# Patient Record
Sex: Male | Born: 1945 | Race: White | Hispanic: No | Marital: Married
Health system: Southern US, Academic
[De-identification: ages and names within clinical notes are randomized; demographics above are authoritative.]

## PROBLEM LIST (undated history)

## (undated) ENCOUNTER — Ambulatory Visit

## (undated) ENCOUNTER — Ambulatory Visit: Payer: MEDICARE

## (undated) ENCOUNTER — Telehealth

## (undated) ENCOUNTER — Telehealth: Attending: Internal Medicine | Primary: Internal Medicine

## (undated) ENCOUNTER — Encounter

## (undated) ENCOUNTER — Encounter: Attending: Cardiovascular Disease | Primary: Cardiovascular Disease

## (undated) ENCOUNTER — Encounter: Attending: Gerontology | Primary: Gerontology

## (undated) ENCOUNTER — Ambulatory Visit: Attending: Pharmacist | Primary: Pharmacist

## (undated) ENCOUNTER — Other Ambulatory Visit: Attending: Internal Medicine | Primary: Internal Medicine

## (undated) ENCOUNTER — Other Ambulatory Visit

## (undated) ENCOUNTER — Encounter: Attending: Geriatric Medicine | Primary: Geriatric Medicine

## (undated) ENCOUNTER — Encounter: Attending: Internal Medicine | Primary: Internal Medicine

## (undated) ENCOUNTER — Encounter: Payer: MEDICARE | Attending: Physical Medicine & Rehabilitation | Primary: Physical Medicine & Rehabilitation

## (undated) ENCOUNTER — Encounter: Attending: Pharmacotherapy | Primary: Pharmacotherapy

## (undated) ENCOUNTER — Encounter: Attending: Rheumatology | Primary: Rheumatology

## (undated) ENCOUNTER — Encounter: Attending: Family | Primary: Family

## (undated) ENCOUNTER — Ambulatory Visit
Payer: MEDICARE | Attending: Student in an Organized Health Care Education/Training Program | Primary: Student in an Organized Health Care Education/Training Program

## (undated) ENCOUNTER — Inpatient Hospital Stay

## (undated) ENCOUNTER — Ambulatory Visit: Payer: MEDICARE | Attending: Cardiovascular Disease | Primary: Cardiovascular Disease

## (undated) ENCOUNTER — Ambulatory Visit: Payer: MEDICARE | Attending: Internal Medicine | Primary: Internal Medicine

## (undated) ENCOUNTER — Encounter: Payer: MEDICARE | Attending: Rheumatology | Primary: Rheumatology

## (undated) ENCOUNTER — Ambulatory Visit: Attending: Clinical | Primary: Clinical

## (undated) ENCOUNTER — Telehealth
Attending: Student in an Organized Health Care Education/Training Program | Primary: Student in an Organized Health Care Education/Training Program

## (undated) ENCOUNTER — Encounter
Payer: MEDICARE | Attending: Student in an Organized Health Care Education/Training Program | Primary: Student in an Organized Health Care Education/Training Program

## (undated) ENCOUNTER — Telehealth: Attending: Rheumatology | Primary: Rheumatology

## (undated) ENCOUNTER — Ambulatory Visit: Payer: MEDICARE | Attending: Gerontology | Primary: Gerontology

## (undated) ENCOUNTER — Encounter
Attending: Student in an Organized Health Care Education/Training Program | Primary: Student in an Organized Health Care Education/Training Program

## (undated) ENCOUNTER — Telehealth: Attending: Geriatric Medicine | Primary: Geriatric Medicine

## (undated) ENCOUNTER — Ambulatory Visit: Payer: MEDICAID | Attending: Pharmacotherapy | Primary: Pharmacotherapy

## (undated) ENCOUNTER — Encounter
Payer: MEDICARE | Attending: Rehabilitative and Restorative Service Providers" | Primary: Rehabilitative and Restorative Service Providers"

## (undated) ENCOUNTER — Ambulatory Visit: Payer: MEDICAID | Attending: Internal Medicine | Primary: Internal Medicine

## (undated) ENCOUNTER — Emergency Department (HOSPITAL_COMMUNITY)

## (undated) DIAGNOSIS — M1990 Unspecified osteoarthritis, unspecified site: Secondary | ICD-10-CM

## (undated) DIAGNOSIS — I10 Essential (primary) hypertension: Secondary | ICD-10-CM

## (undated) DIAGNOSIS — F0390 Unspecified dementia without behavioral disturbance: Secondary | ICD-10-CM

## (undated) HISTORY — PX: CHOLECYSTECTOMY: SHX55

## (undated) HISTORY — PX: THROAT SURGERY: SHX803

---
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
## 2003-01-31 ENCOUNTER — Ambulatory Visit (HOSPITAL_COMMUNITY): Admission: RE | Admit: 2003-01-31 | Discharge: 2003-01-31 | Payer: Self-pay | Admitting: Gastroenterology

## 2003-06-19 ENCOUNTER — Ambulatory Visit (HOSPITAL_BASED_OUTPATIENT_CLINIC_OR_DEPARTMENT_OTHER): Admission: RE | Admit: 2003-06-19 | Discharge: 2003-06-19 | Payer: Self-pay | Admitting: Family Medicine

## 2003-08-22 ENCOUNTER — Ambulatory Visit (HOSPITAL_COMMUNITY): Admission: RE | Admit: 2003-08-22 | Discharge: 2003-08-22 | Payer: Self-pay | Admitting: General Surgery

## 2004-07-26 ENCOUNTER — Ambulatory Visit: Payer: Self-pay | Admitting: Cardiology

## 2004-08-10 ENCOUNTER — Ambulatory Visit: Payer: Self-pay

## 2005-03-28 ENCOUNTER — Emergency Department (HOSPITAL_COMMUNITY): Admission: EM | Admit: 2005-03-28 | Discharge: 2005-03-28 | Payer: Self-pay | Admitting: Emergency Medicine

## 2006-10-25 ENCOUNTER — Emergency Department (HOSPITAL_COMMUNITY): Admission: EM | Admit: 2006-10-25 | Discharge: 2006-10-25 | Payer: Self-pay | Admitting: Emergency Medicine

## 2006-11-04 ENCOUNTER — Emergency Department (HOSPITAL_COMMUNITY): Admission: EM | Admit: 2006-11-04 | Discharge: 2006-11-04 | Payer: Self-pay | Admitting: Emergency Medicine

## 2006-11-19 ENCOUNTER — Ambulatory Visit: Payer: Self-pay | Admitting: Cardiology

## 2006-11-19 ENCOUNTER — Inpatient Hospital Stay (HOSPITAL_COMMUNITY): Admission: EM | Admit: 2006-11-19 | Discharge: 2006-11-21 | Payer: Self-pay | Admitting: Emergency Medicine

## 2006-11-20 ENCOUNTER — Encounter (INDEPENDENT_AMBULATORY_CARE_PROVIDER_SITE_OTHER): Payer: Self-pay | Admitting: Internal Medicine

## 2006-11-27 ENCOUNTER — Ambulatory Visit: Payer: Self-pay

## 2006-12-11 ENCOUNTER — Ambulatory Visit: Payer: Self-pay | Admitting: Cardiology

## 2009-05-03 ENCOUNTER — Encounter (INDEPENDENT_AMBULATORY_CARE_PROVIDER_SITE_OTHER): Payer: Self-pay | Admitting: *Deleted

## 2009-06-02 DIAGNOSIS — R0789 Other chest pain: Secondary | ICD-10-CM

## 2009-06-02 DIAGNOSIS — I359 Nonrheumatic aortic valve disorder, unspecified: Secondary | ICD-10-CM | POA: Insufficient documentation

## 2009-06-02 DIAGNOSIS — I10 Essential (primary) hypertension: Secondary | ICD-10-CM | POA: Insufficient documentation

## 2009-06-02 DIAGNOSIS — R0989 Other specified symptoms and signs involving the circulatory and respiratory systems: Secondary | ICD-10-CM | POA: Insufficient documentation

## 2009-06-02 DIAGNOSIS — E785 Hyperlipidemia, unspecified: Secondary | ICD-10-CM | POA: Insufficient documentation

## 2009-06-02 DIAGNOSIS — R0609 Other forms of dyspnea: Secondary | ICD-10-CM

## 2010-06-09 ENCOUNTER — Encounter: Payer: Self-pay | Admitting: Internal Medicine

## 2010-09-09 ENCOUNTER — Emergency Department (HOSPITAL_COMMUNITY): Payer: No Typology Code available for payment source

## 2010-09-09 ENCOUNTER — Emergency Department (HOSPITAL_COMMUNITY)
Admission: EM | Admit: 2010-09-09 | Discharge: 2010-09-09 | Disposition: A | Discharge status: Home / Self Care | Payer: No Typology Code available for payment source | Attending: Emergency Medicine | Admitting: Emergency Medicine

## 2010-09-09 DIAGNOSIS — Z79899 Other long term (current) drug therapy: Secondary | ICD-10-CM | POA: Insufficient documentation

## 2010-09-09 DIAGNOSIS — R062 Wheezing: Secondary | ICD-10-CM | POA: Insufficient documentation

## 2010-09-09 DIAGNOSIS — J42 Unspecified chronic bronchitis: Secondary | ICD-10-CM | POA: Insufficient documentation

## 2010-09-09 DIAGNOSIS — R55 Syncope and collapse: Secondary | ICD-10-CM | POA: Insufficient documentation

## 2010-09-09 DIAGNOSIS — R059 Cough, unspecified: Secondary | ICD-10-CM | POA: Insufficient documentation

## 2010-09-09 DIAGNOSIS — R51 Headache: Secondary | ICD-10-CM | POA: Insufficient documentation

## 2010-09-09 DIAGNOSIS — M542 Cervicalgia: Secondary | ICD-10-CM | POA: Insufficient documentation

## 2010-09-09 DIAGNOSIS — I10 Essential (primary) hypertension: Secondary | ICD-10-CM | POA: Insufficient documentation

## 2010-09-09 DIAGNOSIS — E7800 Pure hypercholesterolemia, unspecified: Secondary | ICD-10-CM | POA: Insufficient documentation

## 2010-09-09 DIAGNOSIS — R404 Transient alteration of awareness: Secondary | ICD-10-CM | POA: Insufficient documentation

## 2010-09-09 DIAGNOSIS — R109 Unspecified abdominal pain: Secondary | ICD-10-CM | POA: Insufficient documentation

## 2010-09-09 DIAGNOSIS — Z8673 Personal history of transient ischemic attack (TIA), and cerebral infarction without residual deficits: Secondary | ICD-10-CM | POA: Insufficient documentation

## 2010-09-09 DIAGNOSIS — R05 Cough: Secondary | ICD-10-CM | POA: Insufficient documentation

## 2010-10-02 NOTE — Assessment & Plan Note (Signed)
[facility]                            CARDIOLOGY OFFICE NOTE   MAVERIK, FOOT                        MRN:          [id number]  DATE:12/11/2006                            DOB:          March 27, 1946    Dennis [name] returns today for followup.  He was hospitalized July 2.  He  had some atypical chest pain.   At that time, a chest CT was negative for a pulmonary embolus.  He  subsequently had a stress Myoview in the office here on November 27, 2006,  which was an EF of 53% with no ischemia.   He had had a previous 2D echocardiogram as well, which demonstrated mild  aortic valve calcification, mild aortic insufficiency, otherwise normal.   He comes today for followup.  He still has some shortness of breath, but  I have reviewed all of his studies at great length and tried to reassure  him that there is nothing major going on.  He did smoke years ago, but  quit.  He could have some chronic lung disease.   CURRENT MEDICATIONS:  1. Crestor 10 mg a day.  2. Aspirin 81 mg a day.  3. Atenolol 25 mg a day.   PHYSICAL EXAM:  Blood pressure is 120/68, pulse 74 and regular, his  weight is 164.  The rest of his exam is unchanged.   Dennis [name] is stable from a cardiovascular standpoint.  All serous causes  of shortness of breath have been ruled out.  I share with him all the  studies again, and their limitations, particularly nuclear cardiology.  He probably does have some plaque, but it is probably nonobstructive.   I stress risk factor modification and staying active.  I will see him  back in a year.  He probably ought to have an echocardiogram every 2  years.     Thomas C. Daleen Squibb, MD, [facility]  Electronically Signed    TCW/MedQ  DD: 12/11/2006  DT: 12/12/2006  Job #: [id number]   cc:   Lianne Bushy, M.D.

## 2010-10-02 NOTE — Discharge Summary (Signed)
NAME:  Dennis [name], Dennis [name]                 ACCOUNT NO.:  [id number]   MEDICAL RECORD NO.:  [id number]          PATIENT TYPE:  INP   LOCATION:  4735                         FACILITY:  [facility]   PHYSICIAN:  Lonia Blood, M.D.       DATE OF BIRTH:  1945-07-12   DATE OF ADMISSION:  11/19/2006  DATE OF DISCHARGE:  11/21/2006                               DISCHARGE SUMMARY   PRIMARY CARE PHYSICIAN:  Lianne Bushy, M.D.--which makes the patient  unassigned for [facility].   DISCHARGE DIAGNOSIS:  1. Chest pain--noncardiac--resolved.  2. Hypertension.  3. Hyperlipidemia.  4. Exertional asthma.  5. Status post hernia repair.  6. Status post right shoulder surgery.   DISCHARGE MEDICATIONS:  1. Crestor 10 mg daily.  2. Atenolol 25 mg daily.  3. Aspirin 81 mg daily.  4. Albuterol inhaler as needed.   CONDITION ON DISCHARGE:  [name] was discharged in good condition.  The patient was set up for a follow-up outpatient stress test with Dr.  Daleen Squibb from [facility].   PROCEDURE DURING THIS ADMISSION:  1. On November 20, 2006, the patient underwent transthoracic echocardiogram      with findings of ejection fraction of 55% to 65%, mild aortic valve      regurgitation, and mild aortic root dilatation.  2. November 19, 2006, a computer MUGA scan of the chest with intravenous      contrast with findings of no pulmonary embolus and no acute      intrathoracic abnormalities.   HISTORY AND PHYSICAL:  For admission history and physical, refer to  dictated history and physical done by Dr. Karilyn Cota on November 19, 2006.   HOSPITAL COURSE.:  Chest pain:  [name] was admitted to the telemetry  unit of [facility].  He did not show any arrhythmias during the  period of observation.  The patient continued to do well without any  further recurrence of his chest pain.  Interesting enough, the patient  did improve with nonsteroidal anti-inflammatory drugs.  Due to the fact  that the patient has  multiple risk factors for coronary artery disease,  he was seen in consultation by  the [facility], and it was felt that the patient would  benefit from an outpatient stress test.  The patient is discharged home  in good condition, and he is instructed to follow up with Dr. Daleen Squibb in  the outpatient setting.      Lonia Blood, M.D.  Electronically Signed     SL/MEDQ  D:  11/22/2006  T:  11/22/2006  Job:  [id number]   cc:   [facility]

## 2010-10-02 NOTE — Consult Note (Signed)
NAME:  Dennis [name], Dennis [name]                 ACCOUNT NO.:  [id number]   MEDICAL RECORD NO.:  [id number]          PATIENT TYPE:  INP   LOCATION:  4735                         FACILITY:  [facility]   PHYSICIAN:  Thomas C. Wall, MD, FACCDATE OF BIRTH:  1946-05-06   DATE OF CONSULTATION:  11/20/2006  DATE OF DISCHARGE:                                 CONSULTATION   PRIMARY CARDIOLOGIST:  Maisie Fus C. Daleen Squibb, MD, [facility]   PRIMARY CARE PHYSICIAN:  Lianne Bushy, M.D.   We are being asked to see the patient by [facility] secondary to  chest pain.   HISTORY OF PRESENT ILLNESS:  This is a 65 year old Caucasian male with  past medical history of hypertension and dyslipidemia, but no known  coronary artery disease who presented to the emergency room after three  days of constant chest pain.  The pain began on Sunday night while he  was fishing, accompanied by diaphoresis, lightheadedness and dyspnea,  along with palpitations and radiation to the left arm and left leg pain.  He ignored these symptoms when home, and went to bed thinking that it  was an anxiety attack.  When he woke up in the morning, the symptoms had  subsided but the chest pain remained.  He described it as persistent  pain, like someone was pushing on his chest.  Nothing made it better or  worse.  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  The patient was given aspirin and  nitroglycerin and the pain was relieved in the ED.  He has never had an  episode like this before.  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  Had an  echocardiogram.  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  But then he had a follow-up  echocardiogram and it was shown to be open, but  then he was told that it  would close again on its own.  The patient did have a previous cardiac  catheterization in 1994, which was normal.  The patient has had  complaints of generalized fatigue since chest pain onset, but denies  fevers, chills, orthopnea or PND.   REVIEW OF SYSTEMS:  As above; otherwise negative.   PAST MEDICAL HISTORY:  1. Hypertension.  2. Dyslipidemia.  3. TIA versus stroke, 2004 and 2005.  4. Exertional asthma (patient was on inhalers, but has stopped taking      secondary to cough).  5. Past coronary artery workup.  The patient did have a coronary      angiography in 1994, which was normal.  6. LVEF was 50-55%.  The patient did have an Adenosine Cardiolite      which was completed in 2004, revealing left ventricular  function      calculated at 48%, Myoview scan with normal perfusion, consider      echocardiogram to define ejection fraction.   PAST SURGICAL HISTORY:  1. Hernia repair x2.  2. Right shoulder repair.  3. Right thumb repair for tendon replacement.   SOCIAL HISTORY:  The patient lives in [place], [place], alone.  He works as a Production designer, theatre/television/film man.  He is unmarried.  He has a 30-year pack  use, but he has stopped for 10-12 years.  Occasional alcohol use.  No  illicit drug use.  The patient is active, doing a lot of hunting and  fishing and manual labor.  He does not eat any fried foods.   FAMILY HISTORY:  Mother with pulmonary fibrosis, kidney function and  heart trouble.  Father with emphysema and heart problems.  He had a  brother which died at age 53 with enlarged heart.  He had a brother  with an MI at age 54 and subsequent MI at age 71, who is still alive and  well.   REVIEW OF SYSTEMS:  As described; otherwise negative.   CURRENT LABS:  Sodium 142, potassium 4.3, chloride 109, CO2 26, BUN 18,  creatinine 0.94, glucose 83, hemoglobin 14.5, hematocrit 42.5, white  blood cells 8.4, platelets 227, total bilirubin 0.5, AST 20, ALT  20,  alkaline phosphatase 32, CK-MB 82, MB 3.1, troponin 0.03 and 0.03,  respectively.  Total cholesterol 127, LDL 83, HDL 21, triglycerides 117,  D-dimer 1.46, calcium 9.3.  Chest CT angio was negative for PE; did show  a small hiatal hernia.   CURRENT MEDICATIONS:  1. Lovenox 40 mg subcutaneous.  2. Ibuprofen.  3. Protonix 40 mg daily.  4. Simvastatin 40 mg daily.  5. Nitroglycerin sublingual p.r.n.   ALLERGIES:  CODEINE, DECONGESTANTS AND ANTIHISTAMINES.   PHYSICAL EXAMINATION:  VITAL SIGNS:  His blood pressure is 118/74, heart  rate 64, respirations 18, temperature 97.8, O2 sat 95% on room air.  HEENT:  Head is normocephalic and atraumatic.  Eyes:  PERRLA.  Mucous  membranes and mouth are pink and moist.  Dentition is good.  NECK:  Neck is supple.  There is no JVD.  There is no carotid bruit  appreciated.  No lymphadenopathy is palpated.  CARDIOVASCULAR:  Regular rate and rhythm without murmurs, rubs or  gallops.  Pulses are 2+ and equal.  LUNGS:  Lungs are essentially clear to auscultation without wheezes,  rales or rhonchi.  ABDOMEN:  Soft, nontender, with 2+ bowel sounds.  No rebound or  guarding.  No organomegaly is noted.  EXTREMITIES:  Without clubbing, cyanosis, edema, rash.  He does have  some tenderness to his chest wall with palpation of the left sternum.  Otherwise, no other deficiencies in the musculoskeletal.  NEURO:  Cranial nerves II-XII are grossly intact.   IMPRESSION:  1. Atypical chest pain, likely musculoskeletal in origin considering      its persistent pattern and reproducibility on physical exam.  The      patient has positive risk factors for coronary artery disease, a      history of smoking and family history with hypertension and      hyperlipidemia.  Stress test as an outpatient may be indicated for      continued followup.  But at present, there are no signs of cardiac      etiology for discomfort.  2. Hypertension.  The patient is well  controlled on current  medications.  3. History of hyperlipidemia.  The patient is well controlled on      current medications.  4. History of exertional asthma.  The patient should be followed with      pulmonary for continued management of      this, possible PFTs and reinstitution of inhalers to assist with      dyspnea on exertion.  Further cardiac recommendations will be      completed depending on hospital course, although at this time no      further cardiac recommendations are indicated with the exception of      repeating stress test.      Bettey Mare. Lyman Bishop, NP      Jesse Sans. Daleen Squibb, MD, [facility]  Electronically Signed    KML/MEDQ  D:  11/20/2006  T:  11/20/2006  Job:  [id number]   cc:   Lianne Bushy, M.D.

## 2010-10-02 NOTE — H&P (Signed)
NAME:  Dennis [name], Dennis [name]                 ACCOUNT NO.:  [id number]   MEDICAL RECORD NO.:  [id number]          PATIENT TYPE:  INP   LOCATION:  4735                         FACILITY:  [facility]   PHYSICIAN:  Wilson Singer, M.D.DATE OF BIRTH:  1945-12-28   DATE OF ADMISSION:  11/19/2006  DATE OF DISCHARGE:                              HISTORY & PHYSICAL   HISTORY:  This is a 65 year old man who has a history of hypertension  and hyperlipidemia, he is an ex-smoker and has a family history of early  coronary artery disease, who now presents with a 4 day history of left-  sided chest pain.  The pain started spontaneously and they appeared to  be sharp in nature.  He says they radiate to the left arm, but not to  the back.  There is no associated cough, fever or hemoptysis.  There is  no previous history of coronary artery disease.  Because of his risk  factors, his primary care physician was concerned and sent him to the  emergency room.   PAST MEDICAL HISTORY:  1. Hypertension.  2. Hyperlipidemia.  3. Previous history of heart murmur.   FAMILY HISTORY:  His brother is 38 and had his first myocardial  infarction at the age of 62.   SOCIAL HISTORY:  He is divorced and lives alone.  He has been married 3  times.  He is quit smoking 10 years ago and prior to that had been  smoking for 30 years at 2 packet of cigarettes per day.  He occasionally  drinks alcohol.  He does maintenance work.   PAST SURGICAL HISTORY:  1. Right thumb surgery.  2. Bilateral inguinal hernia repairs in 2005.  3. Right shoulder surgery.  4. Left carpal tunnel syndrome surgery.   MEDICATIONS:  1. Crestor 10 mg daily.  2. Atenolol 25 mg daily.   ADVERSE DRUG EFFECTS/ALLERGIES:  1. CODEINE, WHICH PRODUCES ITCHING.  2. ANTIHISTAMINE, WHICH PRODUCES POOR URINARY STREAM.   REVIEW OF SYSTEMS:  Apart from his systems mentioned above, there are no  other symptoms referable to all systems reviewed.   PHYSICAL  EXAMINATION:  VITAL SIGNS:  Temperature 97.1.  Blood pressure  114/67.  Pulse 63.  Respiratory rate 12.  Saturation 97%.  CARDIOVASCULAR EXAMINATION:  Heart sounds are present and normal without  any murmurs.  There is no pericardial rub.  RESPIRATORY:  Lung fields are clear.  There is no pleural rub.  CHEST WALL:  There is tenderness in the left anterior chest, which  reproduces his pain.  ABDOMEN:  Soft, nontender with no hepatosplenomegaly.  NEUROLOGICAL:  Alert and oriented with no focal neurologic signs.   INVESTIGATIONS:  Sodium 140, potassium 4.3, chloride 108, BUN 23,  glucose 102, creatinine 1.0.  Troponin less than 0.05.  D-dimer is  elevated at 1.46.  ECG shows normal sinus rhythm with no acute ST/T wave  changes.  Chest x-ray is, apparently, within normal limits.   IMPRESSION:  1. Chest pain.  Clinically, this is musculoskeletal.  His D-dimer is      elevated.  2. Hypertension.  3. Hyperlipidemia.   PLAN:  1. Admit to telemetry.  2. Serial cardiac enzymes and ECGs.  3. CT angio of chest to rule out pulmonary embolism.  4. Nonsteroidal antiinflammatory drugs to help his probable      musculoskeletal chest pain.  5. Consider cardiology consultation for a stress test in view of his      significant risk factors.  6. Further recommendations will depend on hospital progress.      Wilson Singer, M.D.  Electronically Signed     NCG/MEDQ  D:  11/19/2006  T:  11/20/2006  Job:  [id number]

## 2010-10-05 NOTE — Assessment & Plan Note (Signed)
[facility]                            CARDIOLOGY OFFICE NOTE   LENOX, BINK                        MRN:          [id number]  DATE:06/23/2008                            DOB:          [birth year]    [name] comes in today for followup of his hypertension,  hyperlipidemia, and dyspnea on exertion.   He has been doing remarkably well.  He has been very reactive hunting.  He denies any increase in his dyspnea on exertion.  He is not smoking.   MEDICATIONS:  1. He is currently on aspirin 81 mg a day.  2. Folic acid supplement.  3. Simvastatin 40 mg p.o. nightly.  4. Lisinopril/hydrochlorothiazide 10/12.5 daily.  5. Terazosin 1 mg nightly.  6. Sertraline 50 mg a day.  7. Hydroxychloroquine 200 mg p.o. b.i.d.  8. Methotrexate 25 mg weekly.  9. Fosamax 70 mg weekly.   PHYSICAL EXAMINATION:  VITAL SIGNS:  His blood pressure is excellent  today at 122/70, his pulse 80 and regular, his weight is 168.  HEENT:  Normal except for a beard.  NECK:  Carotid upstrokes were equal bilaterally without bruits, no JVD.  Thyroid is not enlarged.  Trachea is midline.  LUNGS:  Clear to auscultation and percussion.  HEART:  Nondisplaced PMI.  Normal S1, S2.  No S4.  ABDOMEN:  Soft, good bowel sounds.  No midline bruits.  EXTREMITIES:  No cyanosis, clubbing, or edema.  Pulses are intact.  NEUROLOGIC:  Intact.   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   Last lipids were, total cholesterol 127, HDL 21, LDL 83.   [name] is doing well from my standpoint.  I have made no changes in  his medical program.  I have asked him to see [place] back again in a year.     Thomas C. Daleen Squibb, MD, [facility]  Electronically Signed    TCW/MedQ  DD: 06/23/2008  DT: 06/24/2008  Job #: [id number]

## 2010-10-05 NOTE — Op Note (Signed)
NAME:  Dennis [name], Dennis [name]                 ACCOUNT NO.:  [id number]   MEDICAL RECORD NO.:  [id number]          PATIENT TYPE:  EMS   LOCATION:  MAJO                         FACILITY:  [facility]   PHYSICIAN:  Nadara Mustard, MD     DATE OF BIRTH:  1945-06-30   DATE OF PROCEDURE:  03/28/2005  DATE OF DISCHARGE:                                 OPERATIVE REPORT   INDICATIONS FOR PROCEDURE:  The patient is a 65 year old gentleman who  states that he was out black powder hunting at dusk.  He came in to clean  his gun around midnight and states that the gun that he picked up was not  the gun that he shot with, and when he was cleaning the gun, the gun  discharged and discharged through the left thumb.  He states that it exited  through the roof of his house.  The patient states that he was cleaning a  black powder gun.   ALLERGIES:  NO KNOWN DRUG ALLERGIES.   MEDICATIONS:  Blood pressure medication.   PAST SURGICAL HISTORY:  Negative.   REVIEW OF SYSTEMS:  Negative.  The patient is right hand dominant.  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   PHYSICAL EXAMINATION:  The patient has black powder over the entire left  hand.  His thumb is amputated through the IP joint of the thumb, with  necrotic tissue around the edges of the wound secondary to the black power.  Radiograph shows amputation of the distal phalanx of the left thumb.   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   PROCEDURE:  The patient underwent a hematoma block with 20 cc of 2%  lidocaine plain.  The hand and thumb were then scrubbed using a Betadine  scrub brush.  The patient's hand was then cleansed and draped into a sterile  field after prepping again with Betadine.  The necrotic tissue from around  the thumb was removed.  The exposed bone was also debrided, and this was  debrided just proximal to the IP joint.  The wound was again cleansed  with  saline, and the soft tissue was closed without any tension on the skin using  3-0 nylon.  The wound was covered with Adaptic, orthopedic sponges, sterile  Webril, and a Coban dressing.   The patient was discharged to home.  He received 1 g of Kefzol IV in the ER.  He received a tetanus booster.  He was given a prescription for Vicodin and  Keflex, with plans to follow up in the office in one week.      Nadara Mustard, MD  Electronically Signed     MVD/MEDQ  D:  03/28/2005  T:  03/28/2005  Job:  [id number]

## 2010-10-05 NOTE — Op Note (Signed)
   NAME:  Dennis [name], Dennis [name]                             ACCOUNT NO.:  [id number]   MEDICAL RECORD NO.:  [id number]                   PATIENT TYPE:  AMB   LOCATION:  ENDO                                 [facility]ACILITY[facility]  [facility]   PHYSICIAN:  Danise Edge, M.D.                DATE OF BIRTH:  1945/06/09   DATE OF PROCEDURE:  01/31/2003  DATE OF DISCHARGE:                                 OPERATIVE REPORT   PROCEDURE:  Diagnostic colonoscopy.   REFERRED BY:  Dr. Burnell Blanks.   INDICATIONS FOR PROCEDURE:  Dennis [name] is a 65 year old male born  [birth year].  Dennis [name] is scheduled to undergo his first diagnostic  colonoscopy to evaluate guaiac positive stool.   ENDOSCOPIST:  Charolett Bumpers, M.D.   PREMEDICATION:  Versed 5 mg, Demerol 50 mg .   DESCRIPTION OF PROCEDURE:  After obtaining informed consent, [name] was  placed in the left lateral decubitus position. I administered intravenous  Demerol and intravenous Versed to achieve conscious sedation for the  procedure. The patient's blood pressure, oxygen saturation and cardiac  rhythm were monitored throughout the procedure and documented in the medical  record.   Anal inspection was normal. Digital rectal exam was normal. The prostate was  non-nodular. The Olympus pediatric adjustable colonoscope was introduced  into the rectum and easily advanced to the cecum. Colonic preparation for  the exam today was excellent.   RECTUM:  Normal.  SIGMOID COLON AND DESCENDING COLON:  Normal.  SPLENIC FLEXURE:  Normal.  TRANSVERSE COLON:  Normal.  HEPATIC FLEXURE:  Normal.  ASCENDING COLON:  Normal.  CECUM AND ILEOCECAL VALVE:  Normal.   ASSESSMENT:  Normal proctocolonoscopy to the cecum. No endoscopic evidence  for the presence of colorectal neoplasia, inflammatory bowel disease or  gastrointestinal bleeding.      MJ/MEDQ  D:  01/31/2003  T:  01/31/2003  Job:  [id number]   cc:   Burnell Blanks, M.D.  [facility]  [place], [place]

## 2010-10-05 NOTE — Op Note (Signed)
NAME:  Lafountain, Drew L                           ACCOUNT NO.:  [id number]   MEDICAL RECORD NO.:  [id number]                   PATIENT TYPE:  AMB   LOCATION:  DAY                                  FACILITY:  [facility]   PHYSICIAN:  Adolph Pollack, M.D.            DATE OF BIRTH:  Mar 22, 1946   DATE OF PROCEDURE:  08/22/2003  DATE OF DISCHARGE:                                 OPERATIVE REPORT   PREOPERATIVE DIAGNOSIS:  Bilateral inguinal hernias.   POSTOPERATIVE DIAGNOSIS:  Bilateral inguinal hernias.   OPERATION/PROCEDURE:  Laparoscopic repair of bilateral inguinal hernias with  mesh.   SURGEON:  Adolph Pollack, M.D.   ANESTHESIA:  General.   INDICATIONS:  Mr. Cotten is a 65 year old male referred to my partner, Dr.  Darnell Level, for bilateral hernias.  He had an obvious right inguinal hernia  and a smaller left inguinal hernia.  A laparoscopic repair was recommended  to him.  I discussed this with him after he was seen by Dr. Gerrit Friends.  The  procedure and risks were explained to him.  He presents now for the repair.   DESCRIPTION OF PROCEDURE:  He was seen in the holding area and brought to  the operating room, placed supine on the operating table and a general  anesthetic was administered.  A Foley catheter was placed in the bladder  sterilely.  The lower abdominal wall and groin were shaved and sterilely  prepped and draped.  Dilute Marcaine solution was infiltrated in the  subumbilical region and a transverse subumbilical incision was made through  the skin and subcutaneous tissue.  The right anterior rectus sheath was  identified and a small longitudinal incision made in it.  The underlying  right rectus muscle was swept laterally exposing the posterior rectus  sheath.  A balloon dissection device was then placed into the  extraperitoneal space and balloon dissection was performed under  laparoscopic vision.   The balloon dissection device was removed.  A trocar was placed  into the  extraperitoneal space and CO2 gas was insufflated.  Laparoscopic was then  introduced under direct vision.  Two 5 mm trocars were placed through a  small incisions in the lower midline.  Using blunt dissection the symphysis  pubis and Cooper's ligament were exposed bilaterally.  A direct hernia  defect was noted on the right side and extraperitoneal fatty contents were  reduced.  The sac was not reduced.  The anterior and lateral abdominal wall  was then dissected free from the fibrofatty tissue exposing the musculature  up to the level of the umbilicus.  The right spermatic cord was isolated and  peritoneum stripped down to the level of the umbilicus.  No indirect defect  was noted.  The spermatic cord was then encircled using blunt dissection.   Next, I then approached the left side.  Exam of the direct defect area and  the small  direct hernia was noted.  Next, the peritoneal fast was reduced  from it.  Using blunt dissection, the fibrofatty tissue was dissected free  from the anterior and lateral abdominal wall to the level of the umbilicus.  The spermatic cord was encircled and the peritoneum on the cord was then  reduced back to the level of the umbilicus.  No indirect defect was noted.  Following this, a 5 x 6-inch piece of polypropylene mesh was placed into the  right extraperitoneal space.  It had a partial longitudinal slit cut into  it.  The two slits were then wrapped around the spermatic cord.  The mesh  was then anchored to the Cooper's ligament and the anterior and lateral  abdominal wall with spiral tacks.  It had more than adequately covered the  direct, indirect and femoral spaces.   In a similar fashion, a 5 x 6-inch piece of polypropylene mesh was then  placed to the left extraperitoneal space with a partial longitudinal slit  cut into it.  It was positioned with the two tails wrapped around the cord.  The mesh was then anchored to Cooper's ligament and the  anterior and lateral  abdominal wall with spiral tacks.  It again was in appropriate position and  covered the defects in the direct, indirect and femoral spaces.   The area was inspected and hemostasis was adequate.  The inferolateral  aspect of the mesh was then held down with a blunt instrument and the CO2  gas was released and the extraperitoneal contents seemed to approximate the  mesh.  I then removed the two blunt instruments and removed all the trocars.   The right anterior rectus sheath was then closed with interrupted 0 Vicryl  sutures.  The skin incisions were closed with 4-0 Monocryl subcuticular  stitches.  Steri-Strips and sterile dressings were applied.  He tolerated  the procedure well without any apparent complications.  He subsequently was  extubated and taken to the recovery room in satisfactory condition.   The plan is to send him home as an outpatient with postoperative  instructions and Tylox for pain and see back in the office in two to three  weeks.                                               Adolph Pollack, M.D.    Kari Baars  D:  08/22/2003  T:  08/22/2003  Job:  [id number]   cc:   Davene Costain, M.D.  [facility]

## 2011-03-05 LAB — CARDIAC PANEL(CRET KIN+CKTOT+MB+TROPI)
CK, MB: 2.8
Relative Index: INVALID
Total CK: 69

## 2011-03-05 LAB — CBC
HCT: 42.5
MCHC: 34.2
MCV: 90.1
RBC: 4.71
WBC: 8.4

## 2011-03-05 LAB — COMPREHENSIVE METABOLIC PANEL
AST: 20
BUN: 18
CO2: 26
Chloride: 109
Creatinine, Ser: 0.94
GFR calc Af Amer: >60
GFR calc non Af Amer: >60
Glucose, Bld: 83
Potassium: 4.3
Total Bilirubin: 0.5

## 2011-03-05 LAB — I-STAT 8, (EC8 V) (CONVERTED LAB)
Acid-Base Excess: 2
BUN: 23
Chloride: 108
HCT: 46
Hemoglobin: 15.6
Operator id: 257131
Sodium: 140
pCO2, Ven: 34.9 — ABNORMAL LOW

## 2011-03-05 LAB — POCT CARDIAC MARKERS
CKMB, poc: 2.2
Myoglobin, poc: 110
Myoglobin, poc: 83
Operator id: 257131
Operator id: 257131
Troponin i, poc: <0.05

## 2011-03-05 LAB — SEDIMENTATION RATE: Sed Rate: 8

## 2011-03-05 LAB — LIPID PANEL
Cholesterol: 127
Cholesterol: 131
LDL Cholesterol: 83
Total CHOL/HDL Ratio: 6.2
VLDL: 23
VLDL: 23

## 2011-03-05 LAB — CK TOTAL AND CKMB (NOT AT ARMC)
CK, MB: 3.4
Relative Index: 3.2 — ABNORMAL HIGH

## 2011-03-05 LAB — POCT I-STAT CREATININE: Creatinine, Ser: 1

## 2011-03-05 LAB — PROTIME-INR: Prothrombin Time: 12.4

## 2011-03-05 LAB — D-DIMER, QUANTITATIVE: D-Dimer, Quant: 1.46 — ABNORMAL HIGH

## 2011-03-05 LAB — TROPONIN I: Troponin I: 0.03

## 2013-03-12 ENCOUNTER — Encounter: Payer: Self-pay | Admitting: Internal Medicine

## 2013-03-12 ENCOUNTER — Encounter: Payer: Self-pay | Admitting: Cardiology

## 2014-09-15 ENCOUNTER — Emergency Department (HOSPITAL_COMMUNITY): Payer: Medicare Other

## 2014-09-15 ENCOUNTER — Encounter (HOSPITAL_COMMUNITY): Payer: Self-pay | Admitting: Emergency Medicine

## 2014-09-15 ENCOUNTER — Emergency Department (HOSPITAL_COMMUNITY)
Admission: EM | Admit: 2014-09-15 | Discharge: 2014-09-15 | Disposition: A | Discharge status: Home / Self Care | Payer: Medicare Other | Attending: Emergency Medicine | Admitting: Emergency Medicine

## 2014-09-15 DIAGNOSIS — Z87891 Personal history of nicotine dependence: Secondary | ICD-10-CM | POA: Diagnosis not present

## 2014-09-15 DIAGNOSIS — I10 Essential (primary) hypertension: Secondary | ICD-10-CM | POA: Insufficient documentation

## 2014-09-15 DIAGNOSIS — S61412A Laceration without foreign body of left hand, initial encounter: Secondary | ICD-10-CM

## 2014-09-15 DIAGNOSIS — Z8739 Personal history of other diseases of the musculoskeletal system and connective tissue: Secondary | ICD-10-CM | POA: Insufficient documentation

## 2014-09-15 DIAGNOSIS — Y929 Unspecified place or not applicable: Secondary | ICD-10-CM | POA: Insufficient documentation

## 2014-09-15 DIAGNOSIS — Y9389 Activity, other specified: Secondary | ICD-10-CM | POA: Diagnosis not present

## 2014-09-15 DIAGNOSIS — W260XXA Contact with knife, initial encounter: Secondary | ICD-10-CM | POA: Insufficient documentation

## 2014-09-15 DIAGNOSIS — S61211A Laceration without foreign body of left index finger without damage to nail, initial encounter: Secondary | ICD-10-CM | POA: Diagnosis not present

## 2014-09-15 DIAGNOSIS — Y998 Other external cause status: Secondary | ICD-10-CM | POA: Diagnosis not present

## 2014-09-15 HISTORY — DX: Essential (primary) hypertension: I10

## 2014-09-15 HISTORY — DX: Unspecified osteoarthritis, unspecified site: M19.90

## 2014-09-15 MED ORDER — LIDOCAINE HCL (PF) 1 % IJ SOLN
5.0000 mL | Freq: Once | INTRAMUSCULAR | Status: AC
  Administered 2014-09-15: 5 mL
  Filled 2014-09-15: qty 5

## 2014-09-15 NOTE — ED Notes (Signed)
Pt. presents with laceration at left proximal index finger accidentally sustained this evening from his knife , dressing applied at triage / minimal bleeding.

## 2014-09-15 NOTE — ED Notes (Signed)
Pt soaking hand in saline and betadine.

## 2014-09-15 NOTE — Discharge Instructions (Signed)
Keep wound clean with mild soap and water. Keep area covered with a topical antibiotic ointment and bandage, keep bandage dry, and do not submerge in water for 24 hours. Keep the splint on unless bathing, to help avoid the sutures from coming out. Ice and elevate for additional pain relief and swelling. Alternate between Naprosyn and Tylenol for additional pain relief. Follow up with your primary care doctor or the [facility] in approximately 7 days for wound recheck and suture removal. Monitor area for signs of infection to include, but not limited to: increasing pain, redness, drainage/pus, or swelling. Return to emergency department for emergent changing or worsening symptoms.    Laceration Care, Adult A laceration is a cut that goes through all layers of the skin. The cut goes into the tissue beneath the skin. HOME CARE For stitches (sutures) or staples:  Keep the cut clean and dry.  If you have a bandage (dressing), change it at least once a day. Change the bandage if it gets wet or dirty, or as told by your doctor.  Wash the cut with soap and water 2 times a day. Rinse the cut with water. Pat it dry with a clean towel.  Put a thin layer of medicated cream on the cut as told by your doctor.  You may shower after the first 24 hours. Do not soak the cut in water until the stitches are removed.  Only take medicines as told by your doctor.  Have your stitches or staples removed as told by your doctor. For skin adhesive strips:  Keep the cut clean and dry.  Do not get the strips wet. You may take a bath, but be careful to keep the cut dry.  If the cut gets wet, pat it dry with a clean towel.  The strips will fall off on their own. Do not remove the strips that are still stuck to the cut. For wound glue:  You may shower or take baths. Do not soak or scrub the cut. Do not swim. Avoid heavy sweating until the glue falls off on its own. After a shower or bath, pat the  cut dry with a clean towel.  Do not put medicine on your cut until the glue falls off.  If you have a bandage, do not put tape over the glue.  Avoid lots of sunlight or tanning lamps until the glue falls off. Put sunscreen on the cut for the first year to reduce your scar.  The glue will fall off on its own. Do not pick at the glue. You may need a tetanus shot if:  You cannot remember when you had your last tetanus shot.  You have never had a tetanus shot. If you need a tetanus shot and you choose not to have one, you may get tetanus. Sickness from tetanus can be serious. GET HELP RIGHT AWAY IF:   Your pain does not get better with medicine.  Your arm, hand, leg, or foot loses feeling (numbness) or changes color.  Your cut is bleeding.  Your joint feels weak, or you cannot use your joint.  You have painful lumps on your body.  Your cut is red, puffy (swollen), or painful.  You have a red line on the skin near the cut.  You have yellowish-white fluid (pus) coming from the cut.  You have a fever.  You have a bad smell coming from the cut or bandage.  Your cut breaks open before or  after stitches are removed.  You notice something coming out of the cut, such as wood or glass.  You cannot move a finger or toe. MAKE SURE YOU:   Understand these instructions.  Will watch your condition.  Will get help right away if you are not doing well or get worse. Document Released: 10/23/2007 Document Revised: 07/29/2011 Document Reviewed: 10/30/2010 [facility] Patient Information 2015 [place], [place]. This information is not intended to replace advice given to you by your health care provider. Make sure you discuss any questions you have with your health care provider.  Sutured Wound Care Sutures are stitches that can be used to close wounds. Caring for your wound can help stop infection and lessen pain. HOME CARE   Rest and raise (elevate) the injured area until the pain and  puffiness (swelling) go away.  Only take medicines as told by your doctor.  Clean the wound gently with mild soap and water once a day after the first 2 days. Rinse off the soap. Pat the area dry with a clean towel. Do not rub the wound.  Change the bandage (dressing) as told by your doctor. If the bandage sticks, soak it off with soapy water. Stop using a bandage after 2 days or after the wound stops leaking fluid.  Put cream on the wound as told by your doctor.  Do not stretch the wound.  Drink enough fluids to keep your pee (urine) clear or pale yellow.  See your doctor to have the sutures removed.  Use sunscreen or sunblock on the wound after it heals. GET HELP RIGHT AWAY IF:   Your wound gets red, puffy, hot, or tender.  You have more pain in the wound.  You have a red streak that goes away from the wound.  You see yellowish-white fluid (pus) coming out of the wound.  You have a fever.  You have chills and start to shake.  You notice a bad smell coming from the wound.  Your wound will not stop bleeding. MAKE SURE YOU:   Understand these instructions.  Will watch your condition.  Will get help right away if you are not doing well or get worse. Document Released: 10/23/2007 Document Revised: 07/29/2011 Document Reviewed: 09/09/2010 [facility] Patient Information 2015 [place], [place]. This information is not intended to replace advice given to you by your health care provider. Make sure you discuss any questions you have with your health care provider.

## 2014-09-15 NOTE — ED Provider Notes (Signed)
CSN: [id number]     Arrival date & time 09/15/14  2015 History  This chart was scribed for non-physician practitioner working with Dennis Divine, MD by Richarda Overlie, ED Scribe. This patient was seen in room TR04C/TR04C and the patient's care was started at 9:29 PM.      Chief Complaint  Patient presents with  . Laceration   Patient is a 69 y.o. male presenting with skin laceration. The history is provided by the patient. No language interpreter was used.  Laceration Location:  Finger Finger laceration location:  L index finger Depth:  Through dermis Quality: straight   Bleeding: controlled   Time since incident:  2 hours Laceration mechanism:  Knife Pain details:    Severity:  No pain Foreign body present:  No foreign bodies Relieved by:  None tried Worsened by:  Nothing tried Ineffective treatments:  None tried Tetanus status:  Up to date  HPI Comments: Dennis [name] is a 69 y.o. male with a history of arthritis and HTN who presents to the Emergency Department complaining of laceration to his left proximal index finger that occurred 2 hours ago. He states that he was cutting a cardboard box when he accidentally cut himself with his knife. Pt reports he is experiencing no pain this time and says that his bleeding is controlled. He states that the he is experiencing some tingling sensations in his finger. His wife reports that pt takes baby aspirin daily. Pt reports that he is UTD on tetanus. He denies any history of DM or immunocompromising medications/conditions. Pt denies numbness, weakness, CP, SOB, light headedness, abdominal pain, nausea or vomiting.   Past Medical History  Diagnosis Date  . Arthritis   . Hypertension    Past Surgical History  Procedure Laterality Date  . Cholecystectomy    . Throat surgery     No family history on file. History  Substance Use Topics  . Smoking status: Former Games developer  . Smokeless tobacco: Not on file  . Alcohol Use: No    Review of  Systems  Respiratory: Negative for shortness of breath.   Cardiovascular: Negative for chest pain.  Gastrointestinal: Negative for nausea, vomiting and abdominal pain.  Musculoskeletal: Negative for myalgias, joint swelling and arthralgias.  Skin: Positive for wound.  Allergic/Immunologic: Negative for immunocompromised state.  Neurological: Negative for dizziness, weakness, light-headedness and numbness.  Hematological: Bruises/bleeds easily (daily  asa).   10 Systems reviewed and all are negative for acute change except as noted in the HPI.  Allergies  Codeine  Home Medications   Prior to Admission medications   Not on File   BP 161/91 mmHg  Pulse 89  Temp(Src) 99.5 F (37.5 C) (Oral)  Resp 16  SpO2 95% Physical Exam  Constitutional: He is oriented to person, place, and time. Vital signs are normal. He appears well-developed and well-nourished.  Non-toxic appearance. No distress.  Afebrile, nontoxic, NAD  HENT:  Head: Normocephalic and atraumatic.  Mouth/Throat: Mucous membranes are normal.  Eyes: Conjunctivae and EOM are normal. Right eye exhibits no discharge. Left eye exhibits no discharge.  Neck: Normal range of motion. Neck supple.  Cardiovascular: Normal rate and intact distal pulses.   Pulmonary/Chest: Effort normal. No respiratory distress.  Abdominal: Normal appearance. He exhibits no distension.  Musculoskeletal: Normal range of motion.       Left hand: He exhibits laceration. He exhibits normal range of motion, no tenderness, normal two-point discrimination, normal capillary refill, no deformity and no swelling. Normal sensation noted.  Normal strength noted.  L hand with 1cm laceration to index finger over the PIP joint. FROM intact all DIP, PIP, and MCP joints. No bony TTP or deformity. Cap refill brisk and present. No swelling or ongoing bleeding. Sensation and strength grossly intact. Distal pulses intact.   Neurological: He is alert and oriented to person,  place, and time. He has normal strength. No sensory deficit.  Skin: Skin is warm and dry. Laceration noted. No rash noted.  1cm lac as noted above  Psychiatric: He has a normal mood and affect.  Nursing note and vitals reviewed.   ED Course  LACERATION REPAIR Date/Time: 09/15/2014 10:45 PM Performed by: Dennis [name], Dennis [name] Authorized by: Dennis [name], Dennis [name] Consent: Verbal consent obtained. Risks and benefits: risks, benefits and alternatives were discussed Consent given by: patient Patient understanding: patient states understanding of the procedure being performed Patient consent: the patient's understanding of the procedure matches consent given Patient identity confirmed: verbally with patient Body area: upper extremity Location details: left index finger Laceration length: 1 cm Foreign bodies: no foreign bodies Tendon involvement: none Nerve involvement: none Vascular damage: no Anesthesia: local infiltration Local anesthetic: lidocaine 1% without epinephrine Anesthetic total: 1 ml Patient sedated: no Preparation: Patient was prepped and draped in the usual sterile fashion. Irrigation solution: saline Irrigation method: syringe Amount of cleaning: extensive Debridement: none Degree of undermining: none Skin closure: 4-0 Prolene Number of sutures: 2 Technique: simple Approximation: close Approximation difficulty: simple Dressing: 4x4 sterile gauze, splint and antibiotic ointment Patient tolerance: Patient tolerated the procedure well with no immediate complications     DIAGNOSTIC STUDIES: Oxygen Saturation is 95% on RA, normal by my interpretation.    COORDINATION OF CARE: 9:34 PM Discussed treatment plan with pt at bedside and pt agreed to plan.  10:45 PM lidocaine 1% without epinephrine 1mL. Proline. 2 stiches.    Labs Review Labs Reviewed - No data to display  Imaging Review Dg Finger Index Left  09/15/2014   CLINICAL DATA:  69 year old male with  incision of the left index finger from a box cutter earlier today  EXAM: LEFT INDEX FINGER 2+V  COMPARISON:  Prior radiographs of the left index finger 10/25/2006  FINDINGS: Lucency in the soft tissues radial to the proximal phalanx of the index finger consistent with the clinical history of laceration. There is no evidence of underlying osseous abnormality or retained radiopaque foreign body at the site of injury. Chronic retained metallic fragments in the web between the thumb and index finger remain unchanged. Partial thumb amputation incompletely imaged.  IMPRESSION: Soft tissue laceration without evidence of acute fracture or imbedded foreign body at the site of injury.  Stable appearance of small retained metallic fragments in the web between the thumb and first finger dating back to at least 2008.   Electronically Signed   By: Malachy MoanHeath  McCullough M.D.   On: 09/15/2014 21:59     EKG Interpretation None      MDM   Final diagnoses:  Hand laceration, left, initial encounter    69 y.o. male here with L index finger lac. Neurovascularly intact. FROM intact in all digits. Will obtain xray to eval for retained FB or fx. Tetanus UTD. Will cleanse and suture. Pt denies pain, declines pain meds. Will reassess after xray.  11:18 PM Xray neg for acute fx or FB that's new. Sutured with 2 sutures, splinted and bandaged. Discussed care of wound and f/up in 7-10 days for wound recheck and suture removal. Discussed RICE therapy. No abx  at this time, discussed s/sx of infection to return for. I explained the diagnosis and have given explicit precautions to return to the ER including for any other new or worsening symptoms. The patient understands and accepts the medical plan as it's been dictated and I have answered their questions. Discharge instructions concerning home care and prescriptions have been given. The patient is STABLE and is discharged to home in good condition.   I personally performed the  services described in this documentation, which was scribed in my presence. The recorded information has been reviewed and is accurate.   BP 161/91 mmHg  Pulse 89  Temp(Src) 99.5 F (37.5 C) (Oral)  Resp 16  SpO2 95%  Meds ordered this encounter  Medications  . lidocaine (PF) (XYLOCAINE) 1 % injection 5 mL    Sig:        Dennis Brinkmeier Camprubi-Soms, PA-C 09/15/14 2320  Dennis Divine, MD 09/17/14 1224

## 2016-11-25 ENCOUNTER — Ambulatory Visit
Admission: RE | Admit: 2016-11-25 | Discharge: 2016-11-25 | Disposition: A | Discharge status: Home / Self Care | Payer: MEDICARE | Attending: Rheumatology | Admitting: Rheumatology

## 2016-11-25 DIAGNOSIS — M0579 Rheumatoid arthritis with rheumatoid factor of multiple sites without organ or systems involvement: Principal | ICD-10-CM

## 2016-11-25 MED ORDER — METHOTREXATE SODIUM 25 MG/ML INJECTION SOLUTION
SUBCUTANEOUS | 6 refills | 0 days | Status: CP
Start: 2016-11-25 — End: 2017-05-01

## 2016-12-11 ENCOUNTER — Ambulatory Visit
Admission: RE | Admit: 2016-12-11 | Discharge: 2017-01-09 | Disposition: A | Discharge status: Home / Self Care | Payer: MEDICARE | Attending: Rehabilitative and Restorative Service Providers" | Admitting: Rehabilitative and Restorative Service Providers"

## 2016-12-11 DIAGNOSIS — S46912D Strain of unspecified muscle, fascia and tendon at shoulder and upper arm level, left arm, subsequent encounter: Principal | ICD-10-CM

## 2016-12-11 DIAGNOSIS — M25512 Pain in left shoulder: Secondary | ICD-10-CM

## 2016-12-11 DIAGNOSIS — R293 Abnormal posture: Secondary | ICD-10-CM

## 2016-12-11 DIAGNOSIS — M25511 Pain in right shoulder: Secondary | ICD-10-CM

## 2016-12-11 DIAGNOSIS — M25619 Stiffness of unspecified shoulder, not elsewhere classified: Secondary | ICD-10-CM

## 2016-12-12 ENCOUNTER — Ambulatory Visit
Admission: RE | Admit: 2016-12-12 | Discharge: 2016-12-12 | Disposition: A | Discharge status: Home / Self Care | Payer: MEDICARE | Attending: Audiologist | Admitting: Audiologist

## 2016-12-12 DIAGNOSIS — H903 Sensorineural hearing loss, bilateral: Principal | ICD-10-CM

## 2016-12-16 DIAGNOSIS — M25511 Pain in right shoulder: Secondary | ICD-10-CM

## 2016-12-16 DIAGNOSIS — M25619 Stiffness of unspecified shoulder, not elsewhere classified: Secondary | ICD-10-CM

## 2016-12-16 DIAGNOSIS — S46912D Strain of unspecified muscle, fascia and tendon at shoulder and upper arm level, left arm, subsequent encounter: Principal | ICD-10-CM

## 2016-12-16 DIAGNOSIS — M25512 Pain in left shoulder: Secondary | ICD-10-CM

## 2016-12-16 DIAGNOSIS — R293 Abnormal posture: Secondary | ICD-10-CM

## 2016-12-19 DIAGNOSIS — M25512 Pain in left shoulder: Secondary | ICD-10-CM

## 2016-12-19 DIAGNOSIS — S46912D Strain of unspecified muscle, fascia and tendon at shoulder and upper arm level, left arm, subsequent encounter: Principal | ICD-10-CM

## 2016-12-19 DIAGNOSIS — M25511 Pain in right shoulder: Secondary | ICD-10-CM

## 2016-12-19 DIAGNOSIS — M25619 Stiffness of unspecified shoulder, not elsewhere classified: Secondary | ICD-10-CM

## 2016-12-19 DIAGNOSIS — R293 Abnormal posture: Secondary | ICD-10-CM

## 2016-12-20 MED ORDER — GABAPENTIN 100 MG CAPSULE
ORAL_CAPSULE | 3 refills | 0 days | Status: CP
Start: 2016-12-20 — End: 2017-07-08

## 2016-12-23 DIAGNOSIS — M25512 Pain in left shoulder: Secondary | ICD-10-CM

## 2016-12-23 DIAGNOSIS — R293 Abnormal posture: Secondary | ICD-10-CM

## 2016-12-23 DIAGNOSIS — S46912D Strain of unspecified muscle, fascia and tendon at shoulder and upper arm level, left arm, subsequent encounter: Principal | ICD-10-CM

## 2016-12-23 DIAGNOSIS — M25511 Pain in right shoulder: Secondary | ICD-10-CM

## 2016-12-24 ENCOUNTER — Ambulatory Visit
Admission: RE | Admit: 2016-12-24 | Discharge: 2016-12-24 | Disposition: A | Discharge status: Home / Self Care | Payer: MEDICARE | Attending: Geriatric Medicine

## 2016-12-24 DIAGNOSIS — M059 Rheumatoid arthritis with rheumatoid factor, unspecified: Secondary | ICD-10-CM

## 2016-12-24 DIAGNOSIS — J449 Chronic obstructive pulmonary disease, unspecified: Secondary | ICD-10-CM

## 2016-12-24 DIAGNOSIS — I5030 Unspecified diastolic (congestive) heart failure: Principal | ICD-10-CM

## 2016-12-24 DIAGNOSIS — I639 Cerebral infarction, unspecified: Secondary | ICD-10-CM

## 2016-12-24 DIAGNOSIS — M25512 Pain in left shoulder: Secondary | ICD-10-CM

## 2016-12-24 DIAGNOSIS — K2270 Barrett's esophagus without dysplasia: Secondary | ICD-10-CM

## 2016-12-24 DIAGNOSIS — I10 Essential (primary) hypertension: Secondary | ICD-10-CM

## 2016-12-24 MED ORDER — CLONAZEPAM 0.5 MG TABLET
ORAL_TABLET | Freq: Every evening | ORAL | 3 refills | 0.00000 days
Start: 2016-12-24 — End: 2017-04-17

## 2016-12-31 DIAGNOSIS — M25619 Stiffness of unspecified shoulder, not elsewhere classified: Secondary | ICD-10-CM

## 2016-12-31 DIAGNOSIS — M25511 Pain in right shoulder: Secondary | ICD-10-CM

## 2016-12-31 DIAGNOSIS — S46912D Strain of unspecified muscle, fascia and tendon at shoulder and upper arm level, left arm, subsequent encounter: Principal | ICD-10-CM

## 2016-12-31 DIAGNOSIS — R293 Abnormal posture: Secondary | ICD-10-CM

## 2016-12-31 DIAGNOSIS — M25512 Pain in left shoulder: Secondary | ICD-10-CM

## 2017-01-02 ENCOUNTER — Ambulatory Visit
Admission: RE | Admit: 2017-01-02 | Discharge: 2017-01-02 | Payer: MEDICARE | Attending: Audiologist | Admitting: Audiologist

## 2017-01-02 DIAGNOSIS — H903 Sensorineural hearing loss, bilateral: Principal | ICD-10-CM

## 2017-01-03 DIAGNOSIS — M25619 Stiffness of unspecified shoulder, not elsewhere classified: Secondary | ICD-10-CM

## 2017-01-03 DIAGNOSIS — M25512 Pain in left shoulder: Secondary | ICD-10-CM

## 2017-01-03 DIAGNOSIS — M25511 Pain in right shoulder: Secondary | ICD-10-CM

## 2017-01-03 DIAGNOSIS — R293 Abnormal posture: Secondary | ICD-10-CM

## 2017-01-03 DIAGNOSIS — S46912D Strain of unspecified muscle, fascia and tendon at shoulder and upper arm level, left arm, subsequent encounter: Principal | ICD-10-CM

## 2017-01-07 DIAGNOSIS — R293 Abnormal posture: Secondary | ICD-10-CM

## 2017-01-07 DIAGNOSIS — S46912D Strain of unspecified muscle, fascia and tendon at shoulder and upper arm level, left arm, subsequent encounter: Principal | ICD-10-CM

## 2017-01-07 DIAGNOSIS — M25619 Stiffness of unspecified shoulder, not elsewhere classified: Secondary | ICD-10-CM

## 2017-01-07 DIAGNOSIS — M25511 Pain in right shoulder: Secondary | ICD-10-CM

## 2017-01-07 DIAGNOSIS — M25512 Pain in left shoulder: Secondary | ICD-10-CM

## 2017-01-10 ENCOUNTER — Ambulatory Visit
Admission: RE | Admit: 2017-01-10 | Discharge: 2017-02-08 | Disposition: A | Discharge status: Home / Self Care | Payer: MEDICARE | Attending: Rehabilitative and Restorative Service Providers" | Admitting: Rehabilitative and Restorative Service Providers"

## 2017-01-10 DIAGNOSIS — M25619 Stiffness of unspecified shoulder, not elsewhere classified: Secondary | ICD-10-CM

## 2017-01-10 DIAGNOSIS — S46912D Strain of unspecified muscle, fascia and tendon at shoulder and upper arm level, left arm, subsequent encounter: Principal | ICD-10-CM

## 2017-01-13 MED ORDER — MIRTAZAPINE 15 MG TABLET
ORAL_TABLET | 2 refills | 0 days | Status: CP
Start: 2017-01-13 — End: 2017-02-13

## 2017-01-14 DIAGNOSIS — M25511 Pain in right shoulder: Secondary | ICD-10-CM

## 2017-01-14 DIAGNOSIS — R293 Abnormal posture: Secondary | ICD-10-CM

## 2017-01-14 DIAGNOSIS — M25619 Stiffness of unspecified shoulder, not elsewhere classified: Secondary | ICD-10-CM

## 2017-01-14 DIAGNOSIS — S46912D Strain of unspecified muscle, fascia and tendon at shoulder and upper arm level, left arm, subsequent encounter: Principal | ICD-10-CM

## 2017-01-14 DIAGNOSIS — M25512 Pain in left shoulder: Secondary | ICD-10-CM

## 2017-01-17 DIAGNOSIS — S46912D Strain of unspecified muscle, fascia and tendon at shoulder and upper arm level, left arm, subsequent encounter: Principal | ICD-10-CM

## 2017-01-17 DIAGNOSIS — R293 Abnormal posture: Secondary | ICD-10-CM

## 2017-01-17 DIAGNOSIS — M25619 Stiffness of unspecified shoulder, not elsewhere classified: Secondary | ICD-10-CM

## 2017-01-21 DIAGNOSIS — S46912D Strain of unspecified muscle, fascia and tendon at shoulder and upper arm level, left arm, subsequent encounter: Principal | ICD-10-CM

## 2017-01-21 DIAGNOSIS — M25619 Stiffness of unspecified shoulder, not elsewhere classified: Secondary | ICD-10-CM

## 2017-01-24 DIAGNOSIS — M25512 Pain in left shoulder: Secondary | ICD-10-CM

## 2017-01-24 DIAGNOSIS — R293 Abnormal posture: Secondary | ICD-10-CM

## 2017-01-24 DIAGNOSIS — M25511 Pain in right shoulder: Secondary | ICD-10-CM

## 2017-01-24 DIAGNOSIS — M25619 Stiffness of unspecified shoulder, not elsewhere classified: Secondary | ICD-10-CM

## 2017-01-24 DIAGNOSIS — S46912D Strain of unspecified muscle, fascia and tendon at shoulder and upper arm level, left arm, subsequent encounter: Principal | ICD-10-CM

## 2017-01-28 ENCOUNTER — Ambulatory Visit
Admission: RE | Admit: 2017-01-28 | Discharge: 2017-02-26 | Disposition: A | Discharge status: Home / Self Care | Payer: MEDICARE | Attending: Rehabilitative and Restorative Service Providers" | Admitting: Rehabilitative and Restorative Service Providers"

## 2017-01-28 DIAGNOSIS — M79642 Pain in left hand: Secondary | ICD-10-CM

## 2017-01-28 DIAGNOSIS — M05742 Rheumatoid arthritis with rheumatoid factor of left hand without organ or systems involvement: Principal | ICD-10-CM

## 2017-01-28 DIAGNOSIS — M79641 Pain in right hand: Secondary | ICD-10-CM

## 2017-01-28 DIAGNOSIS — M25619 Stiffness of unspecified shoulder, not elsewhere classified: Secondary | ICD-10-CM

## 2017-01-28 DIAGNOSIS — S46912D Strain of unspecified muscle, fascia and tendon at shoulder and upper arm level, left arm, subsequent encounter: Principal | ICD-10-CM

## 2017-01-28 DIAGNOSIS — R293 Abnormal posture: Secondary | ICD-10-CM

## 2017-01-30 DIAGNOSIS — M25619 Stiffness of unspecified shoulder, not elsewhere classified: Secondary | ICD-10-CM

## 2017-01-30 DIAGNOSIS — R293 Abnormal posture: Secondary | ICD-10-CM

## 2017-01-30 DIAGNOSIS — S46912D Strain of unspecified muscle, fascia and tendon at shoulder and upper arm level, left arm, subsequent encounter: Principal | ICD-10-CM

## 2017-02-04 ENCOUNTER — Ambulatory Visit
Admission: RE | Admit: 2017-02-04 | Discharge: 2017-02-04 | Payer: MEDICARE | Attending: Audiologist | Admitting: Audiologist

## 2017-02-04 DIAGNOSIS — H903 Sensorineural hearing loss, bilateral: Principal | ICD-10-CM

## 2017-02-04 DIAGNOSIS — S46912D Strain of unspecified muscle, fascia and tendon at shoulder and upper arm level, left arm, subsequent encounter: Principal | ICD-10-CM

## 2017-02-04 DIAGNOSIS — M25619 Stiffness of unspecified shoulder, not elsewhere classified: Secondary | ICD-10-CM

## 2017-02-07 ENCOUNTER — Emergency Department
Admission: EM | Admit: 2017-02-07 | Discharge: 2017-02-07 | Disposition: A | Discharge status: Home / Self Care | Payer: MEDICARE | Source: Intra-hospital

## 2017-02-07 ENCOUNTER — Emergency Department
Admission: EM | Admit: 2017-02-07 | Discharge: 2017-02-07 | Disposition: A | Discharge status: Home / Self Care | Payer: MEDICARE | Source: Intra-hospital | Attending: Physician Assistant | Admitting: Physician Assistant

## 2017-02-07 DIAGNOSIS — R0602 Shortness of breath: Principal | ICD-10-CM

## 2017-02-11 MED ORDER — TRAMADOL 50 MG TABLET
ORAL_TABLET | 2 refills | 0 days | Status: CP
Start: 2017-02-11 — End: 2018-04-25

## 2017-02-12 MED ORDER — CARVEDILOL 12.5 MG TABLET
ORAL_TABLET | Freq: Two times a day (BID) | ORAL | 2 refills | 0 days | Status: CP
Start: 2017-02-12 — End: 2017-02-13

## 2017-02-13 ENCOUNTER — Ambulatory Visit
Admission: RE | Admit: 2017-02-13 | Discharge: 2017-02-13 | Disposition: A | Discharge status: Home / Self Care | Payer: MEDICARE | Attending: Family Medicine | Admitting: Family Medicine

## 2017-02-13 ENCOUNTER — Ambulatory Visit
Admission: RE | Admit: 2017-02-13 | Discharge: 2017-02-13 | Disposition: A | Discharge status: Home / Self Care | Payer: MEDICARE | Attending: Audiologist | Admitting: Audiologist

## 2017-02-13 DIAGNOSIS — M059 Rheumatoid arthritis with rheumatoid factor, unspecified: Principal | ICD-10-CM

## 2017-02-13 DIAGNOSIS — K22719 Barrett's esophagus with dysplasia, unspecified: Secondary | ICD-10-CM

## 2017-02-13 DIAGNOSIS — R29898 Other symptoms and signs involving the musculoskeletal system: Secondary | ICD-10-CM

## 2017-02-13 DIAGNOSIS — Z9181 History of falling: Secondary | ICD-10-CM

## 2017-02-13 DIAGNOSIS — H903 Sensorineural hearing loss, bilateral: Principal | ICD-10-CM

## 2017-02-13 DIAGNOSIS — I5030 Unspecified diastolic (congestive) heart failure: Secondary | ICD-10-CM

## 2017-02-13 DIAGNOSIS — Z0000 Encounter for general adult medical examination without abnormal findings: Secondary | ICD-10-CM

## 2017-02-13 DIAGNOSIS — R0602 Shortness of breath: Secondary | ICD-10-CM

## 2017-02-13 MED ORDER — NITROGLYCERIN 0.4 MG SUBLINGUAL TABLET
ORAL_TABLET | SUBLINGUAL | 3 refills | 0.00000 days | Status: CP | PRN
Start: 2017-02-13 — End: 2018-04-24

## 2017-02-13 MED ORDER — CARVEDILOL 6.25 MG TABLET
ORAL_TABLET | Freq: Two times a day (BID) | ORAL | 0 refills | 0 days | Status: CP
Start: 2017-02-13 — End: 2017-07-08

## 2017-02-25 ENCOUNTER — Ambulatory Visit
Admission: RE | Admit: 2017-02-25 | Discharge: 2017-02-25 | Payer: MEDICARE | Attending: Orthopaedic Surgery | Admitting: Orthopaedic Surgery

## 2017-02-25 DIAGNOSIS — R29898 Other symptoms and signs involving the musculoskeletal system: Principal | ICD-10-CM

## 2017-02-25 DIAGNOSIS — R252 Cramp and spasm: Secondary | ICD-10-CM

## 2017-02-25 DIAGNOSIS — G4762 Sleep related leg cramps: Secondary | ICD-10-CM

## 2017-03-03 ENCOUNTER — Ambulatory Visit
Admission: RE | Admit: 2017-03-03 | Discharge: 2017-03-03 | Disposition: A | Discharge status: Home / Self Care | Payer: MEDICARE | Attending: Rheumatology | Admitting: Rheumatology

## 2017-03-03 DIAGNOSIS — M059 Rheumatoid arthritis with rheumatoid factor, unspecified: Principal | ICD-10-CM

## 2017-03-03 MED ORDER — DICLOFENAC 1 % TOPICAL GEL
Freq: Four times a day (QID) | TOPICAL | 0 refills | 0 days | Status: CP
Start: 2017-03-03 — End: 2017-06-27

## 2017-03-24 MED ORDER — ATORVASTATIN 40 MG TABLET
ORAL_TABLET | 3 refills | 0 days | Status: CP
Start: 2017-03-24 — End: 2017-07-08

## 2017-03-24 MED ORDER — OMEPRAZOLE 20 MG CAPSULE,DELAYED RELEASE
ORAL_CAPSULE | 3 refills | 0 days | Status: CP
Start: 2017-03-24 — End: 2018-04-15

## 2017-03-27 ENCOUNTER — Ambulatory Visit
Admission: RE | Admit: 2017-03-27 | Discharge: 2017-03-27 | Disposition: A | Discharge status: Home / Self Care | Payer: MEDICARE | Attending: Geriatric Medicine

## 2017-03-27 ENCOUNTER — Ambulatory Visit
Admission: RE | Admit: 2017-03-27 | Discharge: 2017-03-27 | Disposition: A | Discharge status: Home / Self Care | Payer: MEDICARE | Attending: Rheumatology | Admitting: Rheumatology

## 2017-03-27 DIAGNOSIS — I10 Essential (primary) hypertension: Secondary | ICD-10-CM

## 2017-03-27 DIAGNOSIS — M79645 Pain in left finger(s): Secondary | ICD-10-CM

## 2017-03-27 DIAGNOSIS — G8929 Other chronic pain: Secondary | ICD-10-CM

## 2017-03-27 DIAGNOSIS — K210 Gastro-esophageal reflux disease with esophagitis: Secondary | ICD-10-CM

## 2017-03-27 DIAGNOSIS — J45909 Unspecified asthma, uncomplicated: Principal | ICD-10-CM

## 2017-03-27 DIAGNOSIS — G4762 Sleep related leg cramps: Secondary | ICD-10-CM

## 2017-03-27 DIAGNOSIS — M19049 Primary osteoarthritis, unspecified hand: Principal | ICD-10-CM

## 2017-03-27 MED ORDER — TIOTROPIUM BROMIDE 18 MCG CAPSULE WITH INHALATION DEVICE
ORAL_CAPSULE | Freq: Every day | RESPIRATORY_TRACT | 3 refills | 0 days | Status: CP
Start: 2017-03-27 — End: 2017-08-12

## 2017-03-27 MED ORDER — HYDROXYCHLOROQUINE 200 MG TABLET
ORAL_TABLET | 3 refills | 0 days | Status: CP
Start: 2017-03-27 — End: 2018-04-06

## 2017-04-17 MED ORDER — CLONAZEPAM 0.5 MG TABLET
ORAL_TABLET | 3 refills | 0 days | Status: CP
Start: 2017-04-17 — End: 2018-08-11

## 2017-05-01 MED ORDER — METHOTREXATE SODIUM 25 MG/ML INJECTION SOLUTION
SUBCUTANEOUS | 3 refills | 0 days | Status: CP
Start: 2017-05-01 — End: 2018-05-21

## 2017-05-06 ENCOUNTER — Encounter (HOSPITAL_COMMUNITY): Payer: Self-pay | Admitting: *Deleted

## 2017-05-06 ENCOUNTER — Ambulatory Visit (INDEPENDENT_AMBULATORY_CARE_PROVIDER_SITE_OTHER): Payer: Medicare Other

## 2017-05-06 ENCOUNTER — Other Ambulatory Visit: Payer: Self-pay

## 2017-05-06 ENCOUNTER — Ambulatory Visit (HOSPITAL_COMMUNITY)
Admission: EM | Admit: 2017-05-06 | Discharge: 2017-05-06 | Disposition: A | Discharge status: Home / Self Care | Payer: Medicare Other | Attending: Family Medicine | Admitting: Family Medicine

## 2017-05-06 DIAGNOSIS — L03011 Cellulitis of right finger: Secondary | ICD-10-CM

## 2017-05-06 MED ORDER — SULFAMETHOXAZOLE-TRIMETHOPRIM 800-160 MG PO TABS: 1.0000 | ORAL_TABLET | Freq: Two times a day (BID) | ORAL | 0 refills | Status: AC

## 2017-05-06 NOTE — ED Triage Notes (Signed)
Per pt 2 wks ago he noticed his pointer finger on his right hand around the nail bed was hurting really bad. Per pt he don't know what he did to cause the swelling,

## 2017-05-06 NOTE — ED Provider Notes (Signed)
[facility]    CSN: [id number] Arrival date & time: 05/06/17  1021     History   Chief Complaint Chief Complaint  Patient presents with  . Finger Injury    HPI Dennis [name] is a 71 y.o. male.   The history is provided by the patient. No language interpreter was used.  Hand Pain  This is a new problem. The current episode started more than 1 week ago. The problem occurs constantly. The problem has been gradually worsening. Nothing aggravates the symptoms. Nothing relieves the symptoms. He has tried nothing for the symptoms. The treatment provided no relief.  Pt reports he thinks something may have stuck his finger.  Pt reports swelling and pain for 2 weeks. Pt reports finger is very painful.  Past Medical History:  Diagnosis Date  . Arthritis   . Hypertension     Patient Active Problem List   Diagnosis Date Noted  . HYPERLIPIDEMIA 06/02/2009  . HYPERTENSION 06/02/2009  . 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 DYSPNEA ON EXERTION 06/02/2009  . CHEST PAIN, ATYPICAL 06/02/2009    Past Surgical History:  Procedure Laterality Date  . CHOLECYSTECTOMY    . THROAT SURGERY         Home Medications    Prior to Admission medications   Medication Sig Start Date End Date Taking? Authorizing Provider  sulfamethoxazole-trimethoprim (BACTRIM DS,SEPTRA DS) 800-160 MG tablet Take 1 tablet by mouth 2 (two) times daily for 7 days. 05/06/17 05/13/17  Elson AreasSofia, Hulan Szumski K, PA-C    Family History History reviewed. No pertinent family history.  Social History Social History   Tobacco Use  . Smoking status: Former Games developermoker  . Smokeless tobacco: Never Used  Substance Use Topics  . Alcohol use: No  . Drug use: No     Allergies   Codeine and Shellfish allergy   Review of Systems Review of Systems  All other systems reviewed and are negative.    Physical Exam Triage Vital Signs ED Triage Vitals  Enc Vitals Group     BP 05/06/17 1051 (!) 141/64     Pulse  Rate 05/06/17 1051 79     Resp --      Temp 05/06/17 1051 (!) 97.2 F (36.2 C)     Temp Source 05/06/17 1051 Oral     SpO2 05/06/17 1051 96 %     Weight --      Height --      Head Circumference --      Peak Flow --      Pain Score 05/06/17 1048 0     Pain Loc --      Pain Edu? --      Excl. in GC? --    No data found.  Updated Vital Signs BP (!) 141/64 (BP Location: Left Arm)   Pulse 79   Temp (!) 97.2 F (36.2 C) (Oral)   SpO2 96%   Visual Acuity Right Eye Distance:   Left Eye Distance:   Bilateral Distance:    Right Eye Near:   Left Eye Near:    Bilateral Near:     Physical Exam  Constitutional: He appears well-developed and well-nourished.  Musculoskeletal: He exhibits tenderness.  Swollen tip of  right index finger.  No evidence of paronychia.    Neurological: He is alert.  Skin: Skin is warm.  Nursing note and vitals reviewed.    UC Treatments / Results  Labs (all labs ordered are listed, but only abnormal  results are displayed) Labs Reviewed - No data to display  EKG  EKG Interpretation None       Radiology Dg Finger Index Right  Result Date: 05/06/2017 CLINICAL DATA:  Infection.  Pain right second digit. EXAM: RIGHT INDEX FINGER 2+V COMPARISON:  No recent prior. FINDINGS: Diffuse soft tissue swelling. Tiny punctate density noted over the soft tissues of the of midportion of the right second digit. This may be superficial. This could represent a tiny foreign body. Diffuse osteopenia degenerative change. No acute bony abnormality. No evidence of fracture or dislocation. No bony erosions are noted. IMPRESSION: 1. Diffuse soft tissue swelling. Tiny punctate density noted over the soft tissues the midportion right second digit this may be superficial. This could represent a tiny foreign body. 2. Diffuse osteopenia and degenerative change. No acute bony abnormality identified. No bony erosions noted. Electronically Signed   By: Maisie Fushomas  Register   On:  05/06/2017 11:46    Procedures Procedures (including critical care time)  Medications Ordered in UC Medications - No data to display   Initial Impression / Assessment and Plan / UC Course  I have reviewed the triage vital signs and the nursing notes.  Pertinent labs & imaging results that were available during my care of the patient were reviewed by me and considered in my medical decision making (see chart for details).     No foreign body on xray.  I doubt felon.   Pt advised to soak 20 minutes 4 times a day.   Pt advised to recheck here in 2 days Meds ordered this encounter  Medications  . sulfamethoxazole-trimethoprim (BACTRIM DS,SEPTRA DS) 800-160 MG tablet    Sig: Take 1 tablet by mouth 2 (two) times daily for 7 days.    Dispense:  14 tablet    Refill:  0    Order Specific Question:   Supervising Provider    Answer:   Eustace MooreMURRAY, LAURA W [[id number]]    Final Clinical Impressions(s) / UC Diagnoses   Final diagnoses:  Cellulitis of right index finger    ED Discharge Orders        Ordered    sulfamethoxazole-trimethoprim (BACTRIM DS,SEPTRA DS) 800-160 MG tablet  2 times daily     05/06/17 1158       Controlled Substance Prescriptions [place] Controlled Substance Registry consulted? Not Applicable  An After Visit Summary was printed and given to the patient.   Elson AreasSofia, Margeret Stachnik K, [place] 05/06/17 1227

## 2017-05-06 NOTE — Discharge Instructions (Signed)
Soak area 20 minutes 4 times a day.  Return for recheck in 2 days

## 2017-05-07 ENCOUNTER — Telehealth (HOSPITAL_COMMUNITY): Payer: Self-pay | Admitting: Emergency Medicine

## 2017-05-07 NOTE — Telephone Encounter (Signed)
Next of kin called in regards to patient's medicine.  She said there was an issue with prescribed medicine since patient take methotrexate.    Called pharmacy[facility] on [address].  Duane in pharmacy said there was a warning with prescribed medicine and taking methotrexate.    Notified amy yu, pa.  Linward HeadlandAmy Yu, PA instructed medication to be changed to clindamycin 300mg  TID, dispense #21, NO refills.  Called this medication to [facility] at [facility] on [address].    Called [facility].  Notified her that medicine had been called in to they're preferred pharmacy.  Instructed to get medicine going tonight, ASAP.  Return as instructed in a few days.  Instructed if any symptoms worsened to return immediately.

## 2017-05-22 ENCOUNTER — Encounter: Admit: 2017-05-22 | Discharge: 2017-05-23 | Payer: MEDICARE

## 2017-05-22 DIAGNOSIS — I5030 Unspecified diastolic (congestive) heart failure: Secondary | ICD-10-CM

## 2017-05-22 DIAGNOSIS — I639 Cerebral infarction, unspecified: Principal | ICD-10-CM

## 2017-05-22 DIAGNOSIS — J449 Chronic obstructive pulmonary disease, unspecified: Secondary | ICD-10-CM

## 2017-05-22 DIAGNOSIS — M059 Rheumatoid arthritis with rheumatoid factor, unspecified: Secondary | ICD-10-CM

## 2017-05-22 DIAGNOSIS — G4762 Sleep related leg cramps: Secondary | ICD-10-CM

## 2017-05-26 MED ORDER — SULFASALAZINE 500 MG TABLET
ORAL_TABLET | Freq: Two times a day (BID) | ORAL | 3 refills | 0.00000 days | Status: CP
Start: 2017-05-26 — End: 2017-07-07

## 2017-05-26 MED ORDER — FOLIC ACID 1 MG TABLET
ORAL_TABLET | 1 refills | 0 days | Status: CP
Start: 2017-05-26 — End: 2017-07-08

## 2017-06-27 ENCOUNTER — Ambulatory Visit: Admit: 2017-06-27 | Discharge: 2017-06-28 | Payer: MEDICARE | Attending: Rheumatology | Primary: Rheumatology

## 2017-06-27 DIAGNOSIS — M0579 Rheumatoid arthritis with rheumatoid factor of multiple sites without organ or systems involvement: Principal | ICD-10-CM

## 2017-06-27 MED ORDER — DICLOFENAC 1 % TOPICAL GEL
Freq: Four times a day (QID) | TOPICAL | 0 refills | 0 days | Status: CP
Start: 2017-06-27 — End: 2018-02-03

## 2017-07-01 ENCOUNTER — Ambulatory Visit: Admit: 2017-07-01 | Discharge: 2017-07-01 | Payer: MEDICARE

## 2017-07-01 ENCOUNTER — Encounter
Admit: 2017-07-01 | Discharge: 2017-07-01 | Payer: MEDICARE | Attending: Cardiovascular Disease | Primary: Cardiovascular Disease

## 2017-07-01 DIAGNOSIS — I2510 Atherosclerotic heart disease of native coronary artery without angina pectoris: Secondary | ICD-10-CM

## 2017-07-01 DIAGNOSIS — I429 Cardiomyopathy, unspecified: Principal | ICD-10-CM

## 2017-07-01 DIAGNOSIS — E785 Hyperlipidemia, unspecified: Secondary | ICD-10-CM

## 2017-07-01 DIAGNOSIS — I10 Essential (primary) hypertension: Secondary | ICD-10-CM

## 2017-07-07 MED ORDER — SULFASALAZINE 500 MG TABLET
ORAL_TABLET | 3 refills | 0 days | Status: CP
Start: 2017-07-07 — End: 2018-08-11

## 2017-07-08 MED ORDER — OXYBUTYNIN CHLORIDE ER 5 MG TABLET,EXTENDED RELEASE 24 HR
ORAL_TABLET | Freq: Every day | ORAL | 3 refills | 0 days | Status: CP
Start: 2017-07-08 — End: 2017-08-12

## 2017-07-08 MED ORDER — GABAPENTIN 100 MG CAPSULE
ORAL_CAPSULE | Freq: Three times a day (TID) | ORAL | 3 refills | 0.00000 days | Status: CP
Start: 2017-07-08 — End: 2018-08-11

## 2017-07-08 MED ORDER — ATORVASTATIN 40 MG TABLET
ORAL_TABLET | Freq: Every day | ORAL | 3 refills | 0 days | Status: CP
Start: 2017-07-08 — End: 2018-07-06

## 2017-07-08 MED ORDER — FOLIC ACID 1 MG TABLET
ORAL_TABLET | Freq: Every day | ORAL | 3 refills | 0.00000 days | Status: CP
Start: 2017-07-08 — End: 2018-07-08

## 2017-07-08 MED ORDER — CARVEDILOL 6.25 MG TABLET
ORAL_TABLET | Freq: Two times a day (BID) | ORAL | 3 refills | 0 days | Status: CP
Start: 2017-07-08 — End: 2018-08-11

## 2017-07-08 MED ORDER — FINASTERIDE 5 MG TABLET
ORAL_TABLET | Freq: Every day | ORAL | 3 refills | 0 days | Status: CP
Start: 2017-07-08 — End: 2018-08-11

## 2017-07-08 MED ORDER — MIRTAZAPINE 15 MG TABLET
ORAL_TABLET | Freq: Every evening | ORAL | 3 refills | 0 days | Status: CP
Start: 2017-07-08 — End: 2018-07-06

## 2017-07-21 ENCOUNTER — Encounter
Admit: 2017-07-21 | Discharge: 2017-07-21 | Payer: MEDICARE | Attending: Certified Registered" | Primary: Certified Registered"

## 2017-07-21 ENCOUNTER — Encounter: Admit: 2017-07-21 | Discharge: 2017-07-21 | Payer: MEDICARE

## 2017-07-21 DIAGNOSIS — K2270 Barrett's esophagus without dysplasia: Principal | ICD-10-CM

## 2017-08-12 ENCOUNTER — Encounter: Admit: 2017-08-12 | Discharge: 2017-08-13 | Payer: MEDICARE

## 2017-08-12 DIAGNOSIS — E785 Hyperlipidemia, unspecified: Secondary | ICD-10-CM

## 2017-08-12 DIAGNOSIS — I2510 Atherosclerotic heart disease of native coronary artery without angina pectoris: Secondary | ICD-10-CM

## 2017-08-12 DIAGNOSIS — Z9181 History of falling: Secondary | ICD-10-CM

## 2017-08-12 DIAGNOSIS — R404 Transient alteration of awareness: Secondary | ICD-10-CM

## 2017-08-12 DIAGNOSIS — M059 Rheumatoid arthritis with rheumatoid factor, unspecified: Secondary | ICD-10-CM

## 2017-08-12 DIAGNOSIS — K22719 Barrett's esophagus with dysplasia, unspecified: Principal | ICD-10-CM

## 2017-08-12 DIAGNOSIS — J449 Chronic obstructive pulmonary disease, unspecified: Secondary | ICD-10-CM

## 2017-08-12 MED ORDER — ALBUTEROL SULFATE HFA 90 MCG/ACTUATION AEROSOL INHALER
Freq: Four times a day (QID) | RESPIRATORY_TRACT | 2 refills | 0 days | Status: CP | PRN
Start: 2017-08-12 — End: 2017-11-11

## 2017-08-12 MED ORDER — TIOTROPIUM BROMIDE 18 MCG CAPSULE WITH INHALATION DEVICE
ORAL_CAPSULE | Freq: Every day | RESPIRATORY_TRACT | 3 refills | 0 days | Status: CP
Start: 2017-08-12 — End: 2018-08-11

## 2017-09-01 ENCOUNTER — Encounter
Admit: 2017-09-01 | Discharge: 2017-09-01 | Payer: MEDICARE | Attending: Geriatric Medicine | Primary: Geriatric Medicine

## 2017-09-01 ENCOUNTER — Encounter: Admit: 2017-09-01 | Discharge: 2017-09-01 | Payer: MEDICARE

## 2017-09-01 ENCOUNTER — Encounter: Admit: 2017-09-01 | Discharge: 2017-09-01 | Payer: MEDICARE | Attending: Rheumatology | Primary: Rheumatology

## 2017-09-01 DIAGNOSIS — J441 Chronic obstructive pulmonary disease with (acute) exacerbation: Principal | ICD-10-CM

## 2017-09-01 DIAGNOSIS — J449 Chronic obstructive pulmonary disease, unspecified: Secondary | ICD-10-CM

## 2017-09-01 DIAGNOSIS — M059 Rheumatoid arthritis with rheumatoid factor, unspecified: Principal | ICD-10-CM

## 2017-09-01 DIAGNOSIS — I2510 Atherosclerotic heart disease of native coronary artery without angina pectoris: Secondary | ICD-10-CM

## 2017-09-01 DIAGNOSIS — Z9181 History of falling: Secondary | ICD-10-CM

## 2017-09-01 DIAGNOSIS — R05 Cough: Secondary | ICD-10-CM

## 2017-09-01 MED ORDER — PREDNISONE 20 MG TABLET
ORAL_TABLET | Freq: Every day | ORAL | 0 refills | 0.00000 days | Status: CP
Start: 2017-09-01 — End: 2017-09-06

## 2017-09-01 MED ORDER — DOXYCYCLINE HYCLATE 100 MG CAPSULE
ORAL_CAPSULE | Freq: Two times a day (BID) | ORAL | 0 refills | 0 days | Status: CP
Start: 2017-09-01 — End: 2017-09-08

## 2017-09-02 ENCOUNTER — Ambulatory Visit: Admit: 2017-09-02 | Discharge: 2017-09-03 | Payer: MEDICARE

## 2017-09-02 DIAGNOSIS — M542 Cervicalgia: Principal | ICD-10-CM

## 2017-09-23 ENCOUNTER — Encounter: Admit: 2017-09-23 | Discharge: 2017-09-24 | Payer: MEDICARE

## 2017-09-23 DIAGNOSIS — M25512 Pain in left shoulder: Principal | ICD-10-CM

## 2017-10-14 ENCOUNTER — Encounter: Admit: 2017-10-14 | Discharge: 2017-10-15 | Payer: MEDICARE

## 2017-10-14 DIAGNOSIS — J449 Chronic obstructive pulmonary disease, unspecified: Secondary | ICD-10-CM

## 2017-10-14 DIAGNOSIS — M5481 Occipital neuralgia: Secondary | ICD-10-CM

## 2017-10-14 DIAGNOSIS — M059 Rheumatoid arthritis with rheumatoid factor, unspecified: Principal | ICD-10-CM

## 2017-10-14 DIAGNOSIS — M25512 Pain in left shoulder: Secondary | ICD-10-CM

## 2017-11-11 ENCOUNTER — Ambulatory Visit: Admit: 2017-11-11 | Discharge: 2017-11-12 | Payer: MEDICARE

## 2017-11-11 DIAGNOSIS — I2510 Atherosclerotic heart disease of native coronary artery without angina pectoris: Secondary | ICD-10-CM

## 2017-11-11 DIAGNOSIS — M25511 Pain in right shoulder: Secondary | ICD-10-CM

## 2017-11-11 DIAGNOSIS — K22719 Barrett's esophagus with dysplasia, unspecified: Secondary | ICD-10-CM

## 2017-11-11 DIAGNOSIS — M059 Rheumatoid arthritis with rheumatoid factor, unspecified: Secondary | ICD-10-CM

## 2017-11-11 DIAGNOSIS — G44329 Chronic post-traumatic headache, not intractable: Secondary | ICD-10-CM

## 2017-11-11 DIAGNOSIS — G8929 Other chronic pain: Secondary | ICD-10-CM

## 2017-11-11 DIAGNOSIS — J449 Chronic obstructive pulmonary disease, unspecified: Secondary | ICD-10-CM

## 2017-11-11 DIAGNOSIS — I10 Essential (primary) hypertension: Principal | ICD-10-CM

## 2017-11-11 MED ORDER — ALBUTEROL SULFATE HFA 90 MCG/ACTUATION AEROSOL INHALER
Freq: Four times a day (QID) | RESPIRATORY_TRACT | 2 refills | 0 days | Status: CP | PRN
Start: 2017-11-11 — End: 2018-02-24

## 2017-11-17 ENCOUNTER — Encounter: Admit: 2017-11-17 | Discharge: 2017-11-18 | Payer: MEDICARE

## 2017-11-17 DIAGNOSIS — G44329 Chronic post-traumatic headache, not intractable: Principal | ICD-10-CM

## 2017-11-27 ENCOUNTER — Ambulatory Visit
Admit: 2017-11-27 | Discharge: 2017-12-26 | Payer: MEDICARE | Attending: Rehabilitative and Restorative Service Providers" | Primary: Rehabilitative and Restorative Service Providers"

## 2017-11-27 ENCOUNTER — Encounter
Admit: 2017-11-27 | Discharge: 2017-12-26 | Payer: MEDICARE | Attending: Rehabilitative and Restorative Service Providers" | Primary: Rehabilitative and Restorative Service Providers"

## 2017-11-27 DIAGNOSIS — M25512 Pain in left shoulder: Principal | ICD-10-CM

## 2017-11-27 DIAGNOSIS — M25511 Pain in right shoulder: Secondary | ICD-10-CM

## 2017-12-05 MED ORDER — METHOTREXATE SODIUM 25 MG/ML INJECTION SOLUTION
3 refills | 0 days | Status: SS
Start: 2017-12-05 — End: 2018-04-24

## 2017-12-11 DIAGNOSIS — M25512 Pain in left shoulder: Principal | ICD-10-CM

## 2017-12-11 DIAGNOSIS — R293 Abnormal posture: Secondary | ICD-10-CM

## 2017-12-11 DIAGNOSIS — S46912D Strain of unspecified muscle, fascia and tendon at shoulder and upper arm level, left arm, subsequent encounter: Secondary | ICD-10-CM

## 2017-12-11 DIAGNOSIS — M25511 Pain in right shoulder: Secondary | ICD-10-CM

## 2017-12-11 DIAGNOSIS — M25619 Stiffness of unspecified shoulder, not elsewhere classified: Secondary | ICD-10-CM

## 2017-12-12 DIAGNOSIS — M25619 Stiffness of unspecified shoulder, not elsewhere classified: Secondary | ICD-10-CM

## 2017-12-12 DIAGNOSIS — M25512 Pain in left shoulder: Secondary | ICD-10-CM

## 2017-12-12 DIAGNOSIS — M25511 Pain in right shoulder: Secondary | ICD-10-CM

## 2017-12-12 DIAGNOSIS — S46912D Strain of unspecified muscle, fascia and tendon at shoulder and upper arm level, left arm, subsequent encounter: Secondary | ICD-10-CM

## 2017-12-12 DIAGNOSIS — R293 Abnormal posture: Secondary | ICD-10-CM

## 2017-12-16 DIAGNOSIS — R293 Abnormal posture: Secondary | ICD-10-CM

## 2017-12-16 DIAGNOSIS — M25619 Stiffness of unspecified shoulder, not elsewhere classified: Secondary | ICD-10-CM

## 2017-12-16 DIAGNOSIS — M25511 Pain in right shoulder: Principal | ICD-10-CM

## 2017-12-17 DIAGNOSIS — M25511 Pain in right shoulder: Principal | ICD-10-CM

## 2017-12-17 DIAGNOSIS — R293 Abnormal posture: Secondary | ICD-10-CM

## 2017-12-17 DIAGNOSIS — S46912D Strain of unspecified muscle, fascia and tendon at shoulder and upper arm level, left arm, subsequent encounter: Secondary | ICD-10-CM

## 2017-12-17 DIAGNOSIS — M25619 Stiffness of unspecified shoulder, not elsewhere classified: Secondary | ICD-10-CM

## 2017-12-17 DIAGNOSIS — M25512 Pain in left shoulder: Secondary | ICD-10-CM

## 2017-12-29 ENCOUNTER — Encounter
Admit: 2017-12-29 | Discharge: 2018-01-25 | Payer: MEDICARE | Attending: Rehabilitative and Restorative Service Providers" | Primary: Rehabilitative and Restorative Service Providers"

## 2017-12-29 ENCOUNTER — Ambulatory Visit
Admit: 2017-12-29 | Discharge: 2018-01-25 | Payer: MEDICARE | Attending: Rehabilitative and Restorative Service Providers" | Primary: Rehabilitative and Restorative Service Providers"

## 2017-12-29 DIAGNOSIS — M25619 Stiffness of unspecified shoulder, not elsewhere classified: Secondary | ICD-10-CM

## 2017-12-29 DIAGNOSIS — S46912D Strain of unspecified muscle, fascia and tendon at shoulder and upper arm level, left arm, subsequent encounter: Secondary | ICD-10-CM

## 2017-12-29 DIAGNOSIS — M25512 Pain in left shoulder: Secondary | ICD-10-CM

## 2017-12-29 DIAGNOSIS — R293 Abnormal posture: Secondary | ICD-10-CM

## 2017-12-29 DIAGNOSIS — M25511 Pain in right shoulder: Secondary | ICD-10-CM

## 2017-12-31 ENCOUNTER — Encounter: Admit: 2017-12-31 | Discharge: 2018-01-01 | Payer: MEDICARE | Attending: Internal Medicine | Primary: Internal Medicine

## 2017-12-31 DIAGNOSIS — R293 Abnormal posture: Secondary | ICD-10-CM

## 2017-12-31 DIAGNOSIS — M25512 Pain in left shoulder: Secondary | ICD-10-CM

## 2017-12-31 DIAGNOSIS — N401 Enlarged prostate with lower urinary tract symptoms: Secondary | ICD-10-CM

## 2017-12-31 DIAGNOSIS — M25619 Stiffness of unspecified shoulder, not elsewhere classified: Secondary | ICD-10-CM

## 2017-12-31 DIAGNOSIS — R3912 Poor urinary stream: Secondary | ICD-10-CM

## 2017-12-31 DIAGNOSIS — R300 Dysuria: Secondary | ICD-10-CM

## 2017-12-31 DIAGNOSIS — R351 Nocturia: Principal | ICD-10-CM

## 2017-12-31 DIAGNOSIS — S46912D Strain of unspecified muscle, fascia and tendon at shoulder and upper arm level, left arm, subsequent encounter: Secondary | ICD-10-CM

## 2017-12-31 DIAGNOSIS — M25511 Pain in right shoulder: Secondary | ICD-10-CM

## 2017-12-31 MED ORDER — SULFAMETHOXAZOLE 800 MG-TRIMETHOPRIM 160 MG TABLET
ORAL_TABLET | Freq: Two times a day (BID) | ORAL | 0 refills | 0 days | Status: CP
Start: 2017-12-31 — End: 2018-01-07

## 2018-01-01 MED ORDER — TAMSULOSIN 0.4 MG CAPSULE
ORAL_CAPSULE | Freq: Every day | ORAL | 3 refills | 0.00000 days | Status: CP
Start: 2018-01-01 — End: 2018-12-29

## 2018-01-05 ENCOUNTER — Encounter: Admit: 2018-01-05 | Discharge: 2018-01-06 | Payer: MEDICARE

## 2018-01-05 DIAGNOSIS — M25511 Pain in right shoulder: Principal | ICD-10-CM

## 2018-01-05 DIAGNOSIS — M542 Cervicalgia: Secondary | ICD-10-CM

## 2018-01-05 DIAGNOSIS — S46912D Strain of unspecified muscle, fascia and tendon at shoulder and upper arm level, left arm, subsequent encounter: Secondary | ICD-10-CM

## 2018-01-05 DIAGNOSIS — M25619 Stiffness of unspecified shoulder, not elsewhere classified: Secondary | ICD-10-CM

## 2018-01-05 DIAGNOSIS — R293 Abnormal posture: Secondary | ICD-10-CM

## 2018-01-05 DIAGNOSIS — M25512 Pain in left shoulder: Secondary | ICD-10-CM

## 2018-01-07 ENCOUNTER — Ambulatory Visit: Admit: 2018-01-07 | Discharge: 2018-01-08 | Payer: MEDICARE

## 2018-01-07 ENCOUNTER — Encounter: Admit: 2018-01-07 | Discharge: 2018-01-08 | Payer: MEDICARE

## 2018-01-07 DIAGNOSIS — M25512 Pain in left shoulder: Secondary | ICD-10-CM

## 2018-01-07 DIAGNOSIS — M5412 Radiculopathy, cervical region: Secondary | ICD-10-CM

## 2018-01-07 DIAGNOSIS — M25511 Pain in right shoulder: Secondary | ICD-10-CM

## 2018-01-07 DIAGNOSIS — M542 Cervicalgia: Principal | ICD-10-CM

## 2018-01-07 DIAGNOSIS — M25619 Stiffness of unspecified shoulder, not elsewhere classified: Secondary | ICD-10-CM

## 2018-01-07 DIAGNOSIS — S46912D Strain of unspecified muscle, fascia and tendon at shoulder and upper arm level, left arm, subsequent encounter: Secondary | ICD-10-CM

## 2018-01-07 DIAGNOSIS — R293 Abnormal posture: Secondary | ICD-10-CM

## 2018-01-12 ENCOUNTER — Encounter: Admit: 2018-01-12 | Discharge: 2018-01-13 | Payer: MEDICARE

## 2018-01-12 DIAGNOSIS — R293 Abnormal posture: Secondary | ICD-10-CM

## 2018-01-12 DIAGNOSIS — M25511 Pain in right shoulder: Secondary | ICD-10-CM

## 2018-01-12 DIAGNOSIS — M5412 Radiculopathy, cervical region: Principal | ICD-10-CM

## 2018-01-12 DIAGNOSIS — M25512 Pain in left shoulder: Secondary | ICD-10-CM

## 2018-01-12 DIAGNOSIS — S46912D Strain of unspecified muscle, fascia and tendon at shoulder and upper arm level, left arm, subsequent encounter: Secondary | ICD-10-CM

## 2018-01-12 DIAGNOSIS — M25619 Stiffness of unspecified shoulder, not elsewhere classified: Secondary | ICD-10-CM

## 2018-01-13 DIAGNOSIS — M25512 Pain in left shoulder: Secondary | ICD-10-CM

## 2018-01-13 DIAGNOSIS — M25511 Pain in right shoulder: Principal | ICD-10-CM

## 2018-01-14 ENCOUNTER — Ambulatory Visit: Admit: 2018-01-14 | Discharge: 2018-01-15 | Payer: MEDICARE

## 2018-01-14 DIAGNOSIS — M4802 Spinal stenosis, cervical region: Secondary | ICD-10-CM

## 2018-01-14 DIAGNOSIS — M5412 Radiculopathy, cervical region: Principal | ICD-10-CM

## 2018-01-14 DIAGNOSIS — M75101 Unspecified rotator cuff tear or rupture of right shoulder, not specified as traumatic: Secondary | ICD-10-CM

## 2018-01-14 MED ORDER — ONDANSETRON 4 MG DISINTEGRATING TABLET
ORAL_TABLET | 3 refills | 0 days | Status: CP
Start: 2018-01-14 — End: 2018-08-11

## 2018-01-22 ENCOUNTER — Encounter: Admit: 2018-01-22 | Discharge: 2018-01-23 | Payer: MEDICARE

## 2018-01-22 DIAGNOSIS — M75111 Incomplete rotator cuff tear or rupture of right shoulder, not specified as traumatic: Principal | ICD-10-CM

## 2018-02-03 MED ORDER — DICLOFENAC 1 % TOPICAL GEL
Freq: Four times a day (QID) | TOPICAL | 0 refills | 0.00000 days | Status: CP
Start: 2018-02-03 — End: 2018-04-06

## 2018-02-11 ENCOUNTER — Encounter: Admit: 2018-02-11 | Discharge: 2018-02-12 | Payer: MEDICARE | Attending: Audiologist | Primary: Audiologist

## 2018-02-11 DIAGNOSIS — H903 Sensorineural hearing loss, bilateral: Principal | ICD-10-CM

## 2018-02-24 ENCOUNTER — Encounter: Admit: 2018-02-24 | Discharge: 2018-02-24 | Payer: MEDICARE

## 2018-02-24 ENCOUNTER — Encounter: Admit: 2018-02-24 | Discharge: 2018-02-24 | Payer: MEDICARE | Attending: Audiologist | Primary: Audiologist

## 2018-02-24 DIAGNOSIS — J449 Chronic obstructive pulmonary disease, unspecified: Secondary | ICD-10-CM

## 2018-02-24 DIAGNOSIS — N401 Enlarged prostate with lower urinary tract symptoms: Secondary | ICD-10-CM

## 2018-02-24 DIAGNOSIS — M5481 Occipital neuralgia: Secondary | ICD-10-CM

## 2018-02-24 DIAGNOSIS — R350 Frequency of micturition: Secondary | ICD-10-CM

## 2018-02-24 DIAGNOSIS — M25511 Pain in right shoulder: Secondary | ICD-10-CM

## 2018-02-24 DIAGNOSIS — M059 Rheumatoid arthritis with rheumatoid factor, unspecified: Secondary | ICD-10-CM

## 2018-02-24 DIAGNOSIS — H903 Sensorineural hearing loss, bilateral: Principal | ICD-10-CM

## 2018-02-24 DIAGNOSIS — R110 Nausea: Principal | ICD-10-CM

## 2018-02-24 DIAGNOSIS — R3912 Poor urinary stream: Secondary | ICD-10-CM

## 2018-02-24 DIAGNOSIS — G8929 Other chronic pain: Secondary | ICD-10-CM

## 2018-02-24 MED ORDER — ALBUTEROL SULFATE HFA 90 MCG/ACTUATION AEROSOL INHALER
Freq: Four times a day (QID) | RESPIRATORY_TRACT | 2 refills | 0.00000 days | Status: CP | PRN
Start: 2018-02-24 — End: 2018-08-13

## 2018-04-06 ENCOUNTER — Encounter: Admit: 2018-04-06 | Discharge: 2018-04-07 | Payer: MEDICARE

## 2018-04-06 DIAGNOSIS — Z5181 Encounter for therapeutic drug level monitoring: Secondary | ICD-10-CM

## 2018-04-06 DIAGNOSIS — Z23 Encounter for immunization: Secondary | ICD-10-CM

## 2018-04-06 DIAGNOSIS — M19049 Primary osteoarthritis, unspecified hand: Principal | ICD-10-CM

## 2018-04-06 DIAGNOSIS — M059 Rheumatoid arthritis with rheumatoid factor, unspecified: Secondary | ICD-10-CM

## 2018-04-06 DIAGNOSIS — D899 Disorder involving the immune mechanism, unspecified: Secondary | ICD-10-CM

## 2018-04-06 DIAGNOSIS — D649 Anemia, unspecified: Secondary | ICD-10-CM

## 2018-04-06 MED ORDER — HYDROXYCHLOROQUINE 200 MG TABLET
ORAL_TABLET | 3 refills | 0 days | Status: CP
Start: 2018-04-06 — End: 2019-04-06

## 2018-04-06 MED ORDER — DICLOFENAC 1 % TOPICAL GEL
Freq: Four times a day (QID) | TOPICAL | 0 refills | 0.00000 days | Status: CP
Start: 2018-04-06 — End: 2019-04-06

## 2018-04-20 MED ORDER — OMEPRAZOLE 20 MG CAPSULE,DELAYED RELEASE
ORAL_CAPSULE | 3 refills | 0 days | Status: CP
Start: 2018-04-20 — End: ?

## 2018-04-23 ENCOUNTER — Ambulatory Visit: Admit: 2018-04-23 | Discharge: 2018-04-24 | Payer: MEDICARE

## 2018-04-23 ENCOUNTER — Encounter: Admit: 2018-04-23 | Discharge: 2018-04-24 | Payer: MEDICARE

## 2018-04-23 DIAGNOSIS — D649 Anemia, unspecified: Principal | ICD-10-CM

## 2018-04-23 DIAGNOSIS — R079 Chest pain, unspecified: Secondary | ICD-10-CM

## 2018-04-23 DIAGNOSIS — I2510 Atherosclerotic heart disease of native coronary artery without angina pectoris: Secondary | ICD-10-CM

## 2018-04-23 DIAGNOSIS — R0602 Shortness of breath: Principal | ICD-10-CM

## 2018-04-24 MED ORDER — NITROGLYCERIN 0.4 MG SUBLINGUAL TABLET
SUBLINGUAL | 0 refills | 0.00000 days | PRN
Start: 2018-04-24 — End: 2018-08-11

## 2018-04-25 MED ORDER — FUROSEMIDE 20 MG TABLET
ORAL_TABLET | Freq: Every day | ORAL | 3 refills | 0 days | Status: CP
Start: 2018-04-25 — End: 2018-08-11

## 2018-04-25 MED ORDER — FLUTICASONE PROPIONATE 50 MCG/ACTUATION NASAL SPRAY,SUSPENSION
Freq: Every day | NASAL | 0 refills | 0.00000 days
Start: 2018-04-25 — End: 2018-08-11

## 2018-04-27 MED ORDER — TRAMADOL 50 MG TABLET
ORAL_TABLET | 2 refills | 0 days | Status: CP
Start: 2018-04-27 — End: ?

## 2018-05-04 ENCOUNTER — Encounter: Admit: 2018-05-04 | Discharge: 2018-05-05 | Payer: MEDICARE

## 2018-05-04 DIAGNOSIS — I5043 Acute on chronic combined systolic (congestive) and diastolic (congestive) heart failure: Principal | ICD-10-CM

## 2018-05-04 DIAGNOSIS — D509 Iron deficiency anemia, unspecified: Secondary | ICD-10-CM

## 2018-05-21 MED ORDER — METHOTREXATE SODIUM 25 MG/ML INJECTION SOLUTION
SUBCUTANEOUS | 3 refills | 0.00000 days | Status: CP
Start: 2018-05-21 — End: 2018-05-25

## 2018-05-25 MED ORDER — METHOTREXATE SODIUM 25 MG/ML INJECTION SOLUTION
SUBCUTANEOUS | 3 refills | 0 days | Status: CP
Start: 2018-05-25 — End: 2018-12-30

## 2018-06-09 ENCOUNTER — Encounter: Admit: 2018-06-09 | Discharge: 2018-06-10 | Payer: MEDICARE

## 2018-06-09 DIAGNOSIS — M059 Rheumatoid arthritis with rheumatoid factor, unspecified: Secondary | ICD-10-CM

## 2018-06-09 DIAGNOSIS — J449 Chronic obstructive pulmonary disease, unspecified: Secondary | ICD-10-CM

## 2018-06-09 DIAGNOSIS — D509 Iron deficiency anemia, unspecified: Secondary | ICD-10-CM

## 2018-06-09 DIAGNOSIS — R634 Abnormal weight loss: Secondary | ICD-10-CM

## 2018-06-09 DIAGNOSIS — R350 Frequency of micturition: Secondary | ICD-10-CM

## 2018-06-09 DIAGNOSIS — I639 Cerebral infarction, unspecified: Secondary | ICD-10-CM

## 2018-06-09 DIAGNOSIS — I5043 Acute on chronic combined systolic (congestive) and diastolic (congestive) heart failure: Principal | ICD-10-CM

## 2018-06-09 MED ORDER — SULFAMETHOXAZOLE 800 MG-TRIMETHOPRIM 160 MG TABLET
ORAL_TABLET | Freq: Two times a day (BID) | ORAL | 0 refills | 0.00000 days | Status: CP
Start: 2018-06-09 — End: 2018-08-11

## 2018-06-24 ENCOUNTER — Encounter
Admit: 2018-06-24 | Discharge: 2018-06-25 | Payer: MEDICARE | Attending: Geriatric Medicine | Primary: Geriatric Medicine

## 2018-06-24 DIAGNOSIS — D509 Iron deficiency anemia, unspecified: Principal | ICD-10-CM

## 2018-07-06 MED ORDER — MIRTAZAPINE 15 MG TABLET
0 refills | 0 days | Status: CP
Start: 2018-07-06 — End: 2018-08-13

## 2018-07-06 MED ORDER — ATORVASTATIN 40 MG TABLET
1 refills | 0 days | Status: CP
Start: 2018-07-06 — End: ?

## 2018-07-17 MED ORDER — PEG-ELECTROLYTE SOLUTION 420 GRAM ORAL SOLUTION
0 refills | 0 days | Status: CP
Start: 2018-07-17 — End: 2018-12-29

## 2018-07-20 ENCOUNTER — Encounter: Admit: 2018-07-20 | Discharge: 2018-07-20 | Payer: MEDICARE | Attending: Anesthesiology | Primary: Anesthesiology

## 2018-07-20 ENCOUNTER — Encounter: Admit: 2018-07-20 | Discharge: 2018-07-20 | Payer: MEDICARE

## 2018-07-20 DIAGNOSIS — K317 Polyp of stomach and duodenum: Principal | ICD-10-CM

## 2018-07-20 DIAGNOSIS — M069 Rheumatoid arthritis, unspecified: Principal | ICD-10-CM

## 2018-07-20 DIAGNOSIS — Z885 Allergy status to narcotic agent status: Principal | ICD-10-CM

## 2018-07-20 DIAGNOSIS — I10 Essential (primary) hypertension: Principal | ICD-10-CM

## 2018-07-20 DIAGNOSIS — D509 Iron deficiency anemia, unspecified: Principal | ICD-10-CM

## 2018-07-20 DIAGNOSIS — J45909 Unspecified asthma, uncomplicated: Principal | ICD-10-CM

## 2018-07-20 DIAGNOSIS — M2550 Pain in unspecified joint: Principal | ICD-10-CM

## 2018-07-20 DIAGNOSIS — Z8673 Personal history of transient ischemic attack (TIA), and cerebral infarction without residual deficits: Principal | ICD-10-CM

## 2018-07-20 DIAGNOSIS — M1990 Unspecified osteoarthritis, unspecified site: Principal | ICD-10-CM

## 2018-07-20 DIAGNOSIS — I519 Heart disease, unspecified: Principal | ICD-10-CM

## 2018-07-20 DIAGNOSIS — K449 Diaphragmatic hernia without obstruction or gangrene: Principal | ICD-10-CM

## 2018-07-20 DIAGNOSIS — S4990XA Unspecified injury of shoulder and upper arm, unspecified arm, initial encounter: Principal | ICD-10-CM

## 2018-07-20 DIAGNOSIS — K2270 Barrett's esophagus without dysplasia: Principal | ICD-10-CM

## 2018-07-20 DIAGNOSIS — N400 Enlarged prostate without lower urinary tract symptoms: Principal | ICD-10-CM

## 2018-07-20 DIAGNOSIS — I639 Cerebral infarction, unspecified: Principal | ICD-10-CM

## 2018-07-20 DIAGNOSIS — H9190 Unspecified hearing loss, unspecified ear: Principal | ICD-10-CM

## 2018-07-20 DIAGNOSIS — K648 Other hemorrhoids: Principal | ICD-10-CM

## 2018-07-20 DIAGNOSIS — J449 Chronic obstructive pulmonary disease, unspecified: Principal | ICD-10-CM

## 2018-07-20 DIAGNOSIS — H01009 Unspecified blepharitis unspecified eye, unspecified eyelid: Principal | ICD-10-CM

## 2018-07-20 DIAGNOSIS — S6990XA Unspecified injury of unspecified wrist, hand and finger(s), initial encounter: Principal | ICD-10-CM

## 2018-07-20 DIAGNOSIS — R300 Dysuria: Principal | ICD-10-CM

## 2018-07-20 DIAGNOSIS — G4730 Sleep apnea, unspecified: Principal | ICD-10-CM

## 2018-07-20 DIAGNOSIS — I2510 Atherosclerotic heart disease of native coronary artery without angina pectoris: Principal | ICD-10-CM

## 2018-07-20 DIAGNOSIS — R6889 Other general symptoms and signs: Principal | ICD-10-CM

## 2018-07-20 DIAGNOSIS — H04129 Dry eye syndrome of unspecified lacrimal gland: Principal | ICD-10-CM

## 2018-07-20 DIAGNOSIS — H269 Unspecified cataract: Principal | ICD-10-CM

## 2018-07-21 DIAGNOSIS — I639 Cerebral infarction, unspecified: Principal | ICD-10-CM

## 2018-07-21 DIAGNOSIS — M2550 Pain in unspecified joint: Principal | ICD-10-CM

## 2018-07-21 DIAGNOSIS — H01009 Unspecified blepharitis unspecified eye, unspecified eyelid: Principal | ICD-10-CM

## 2018-07-21 DIAGNOSIS — S4990XA Unspecified injury of shoulder and upper arm, unspecified arm, initial encounter: Principal | ICD-10-CM

## 2018-07-21 DIAGNOSIS — R300 Dysuria: Principal | ICD-10-CM

## 2018-07-21 DIAGNOSIS — H269 Unspecified cataract: Principal | ICD-10-CM

## 2018-07-21 DIAGNOSIS — I10 Essential (primary) hypertension: Principal | ICD-10-CM

## 2018-07-21 DIAGNOSIS — K2270 Barrett's esophagus without dysplasia: Principal | ICD-10-CM

## 2018-07-21 DIAGNOSIS — S6990XA Unspecified injury of unspecified wrist, hand and finger(s), initial encounter: Principal | ICD-10-CM

## 2018-07-21 DIAGNOSIS — J45909 Unspecified asthma, uncomplicated: Principal | ICD-10-CM

## 2018-07-21 DIAGNOSIS — G4730 Sleep apnea, unspecified: Principal | ICD-10-CM

## 2018-07-21 DIAGNOSIS — H9190 Unspecified hearing loss, unspecified ear: Principal | ICD-10-CM

## 2018-07-21 DIAGNOSIS — J449 Chronic obstructive pulmonary disease, unspecified: Principal | ICD-10-CM

## 2018-07-21 DIAGNOSIS — H04129 Dry eye syndrome of unspecified lacrimal gland: Principal | ICD-10-CM

## 2018-07-21 DIAGNOSIS — I519 Heart disease, unspecified: Principal | ICD-10-CM

## 2018-07-21 DIAGNOSIS — N400 Enlarged prostate without lower urinary tract symptoms: Principal | ICD-10-CM

## 2018-07-21 DIAGNOSIS — M1990 Unspecified osteoarthritis, unspecified site: Principal | ICD-10-CM

## 2018-07-21 DIAGNOSIS — R6889 Other general symptoms and signs: Principal | ICD-10-CM

## 2018-07-21 DIAGNOSIS — I2510 Atherosclerotic heart disease of native coronary artery without angina pectoris: Principal | ICD-10-CM

## 2018-07-31 DIAGNOSIS — Z8719 Personal history of other diseases of the digestive system: Principal | ICD-10-CM

## 2018-07-31 DIAGNOSIS — R1310 Dysphagia, unspecified: Principal | ICD-10-CM

## 2018-07-31 DIAGNOSIS — K210 Gastro-esophageal reflux disease with esophagitis: Principal | ICD-10-CM

## 2018-08-11 DIAGNOSIS — I5030 Unspecified diastolic (congestive) heart failure: Principal | ICD-10-CM

## 2018-08-11 MED ORDER — FINASTERIDE 5 MG TABLET
ORAL_TABLET | Freq: Every day | ORAL | 3 refills | 0 days | Status: CP
Start: 2018-08-11 — End: ?

## 2018-08-11 MED ORDER — TIOTROPIUM BROMIDE 18 MCG CAPSULE WITH INHALATION DEVICE
ORAL_CAPSULE | Freq: Every day | RESPIRATORY_TRACT | 3 refills | 0 days | Status: CP
Start: 2018-08-11 — End: 2019-08-11

## 2018-08-11 MED ORDER — SULFASALAZINE 500 MG TABLET
ORAL_TABLET | Freq: Three times a day (TID) | ORAL | 3 refills | 0.00000 days | Status: CP
Start: 2018-08-11 — End: ?

## 2018-08-11 MED ORDER — GABAPENTIN 100 MG CAPSULE
ORAL_CAPSULE | Freq: Three times a day (TID) | ORAL | 3 refills | 0 days | Status: CP
Start: 2018-08-11 — End: 2018-09-17

## 2018-08-11 MED ORDER — CARVEDILOL 6.25 MG TABLET
ORAL_TABLET | Freq: Two times a day (BID) | ORAL | 3 refills | 0 days | Status: CP
Start: 2018-08-11 — End: 2018-09-17

## 2018-08-11 MED ORDER — FUROSEMIDE 20 MG TABLET
ORAL_TABLET | Freq: Every day | ORAL | 3 refills | 0.00000 days | Status: CP
Start: 2018-08-11 — End: 2018-12-29

## 2018-08-13 DIAGNOSIS — J449 Chronic obstructive pulmonary disease, unspecified: Principal | ICD-10-CM

## 2018-08-13 MED ORDER — MIRTAZAPINE 15 MG TABLET
0 refills | 0 days | Status: CP
Start: 2018-08-13 — End: ?

## 2018-08-13 MED ORDER — ALBUTEROL SULFATE HFA 90 MCG/ACTUATION AEROSOL INHALER
Freq: Four times a day (QID) | RESPIRATORY_TRACT | 2 refills | 0 days | Status: CP | PRN
Start: 2018-08-13 — End: ?

## 2018-09-17 MED ORDER — CARVEDILOL 6.25 MG TABLET
ORAL_TABLET | 3 refills | 0 days | Status: CP
Start: 2018-09-17 — End: ?

## 2018-09-17 MED ORDER — GABAPENTIN 100 MG CAPSULE
ORAL_CAPSULE | 3 refills | 0 days | Status: CP
Start: 2018-09-17 — End: ?

## 2018-10-08 ENCOUNTER — Encounter: Admit: 2018-10-08 | Discharge: 2018-10-09 | Payer: MEDICARE

## 2018-11-03 ENCOUNTER — Encounter: Admit: 2018-11-03 | Discharge: 2018-11-04 | Payer: MEDICARE

## 2018-11-03 DIAGNOSIS — H903 Sensorineural hearing loss, bilateral: Principal | ICD-10-CM

## 2018-11-03 MED ORDER — CIPROFLOXACIN 500 MG TABLET
ORAL_TABLET | Freq: Two times a day (BID) | ORAL | 0 refills | 0 days | Status: CP
Start: 2018-11-03 — End: 2018-11-10

## 2018-12-29 ENCOUNTER — Encounter: Admit: 2018-12-29 | Discharge: 2018-12-29 | Payer: MEDICARE

## 2018-12-29 DIAGNOSIS — D649 Anemia, unspecified: Secondary | ICD-10-CM

## 2018-12-29 DIAGNOSIS — R350 Frequency of micturition: Principal | ICD-10-CM

## 2018-12-29 DIAGNOSIS — R1084 Generalized abdominal pain: Secondary | ICD-10-CM

## 2018-12-29 DIAGNOSIS — J449 Chronic obstructive pulmonary disease, unspecified: Secondary | ICD-10-CM

## 2018-12-29 DIAGNOSIS — I5043 Acute on chronic combined systolic (congestive) and diastolic (congestive) heart failure: Secondary | ICD-10-CM

## 2018-12-29 DIAGNOSIS — I10 Essential (primary) hypertension: Secondary | ICD-10-CM

## 2018-12-29 DIAGNOSIS — R634 Abnormal weight loss: Secondary | ICD-10-CM

## 2018-12-29 DIAGNOSIS — K2270 Barrett's esophagus without dysplasia: Principal | ICD-10-CM

## 2018-12-29 DIAGNOSIS — Z5181 Encounter for therapeutic drug level monitoring: Principal | ICD-10-CM

## 2018-12-29 DIAGNOSIS — I639 Cerebral infarction, unspecified: Secondary | ICD-10-CM

## 2018-12-29 DIAGNOSIS — M059 Rheumatoid arthritis with rheumatoid factor, unspecified: Secondary | ICD-10-CM

## 2018-12-29 MED ORDER — TAMSULOSIN 0.4 MG CAPSULE
ORAL_CAPSULE | Freq: Every day | ORAL | 3 refills | 90 days | Status: CP
Start: 2018-12-29 — End: 2019-12-29

## 2018-12-30 MED ORDER — METHOTREXATE SODIUM 25 MG/ML INJECTION SOLUTION
SUBCUTANEOUS | 3 refills | 28.00000 days | Status: CP
Start: 2018-12-30 — End: ?

## 2019-01-29 MED ORDER — FOLIC ACID 1 MG TABLET
ORAL_TABLET | 3 refills | 0 days | Status: CP
Start: 2019-01-29 — End: ?

## 2019-01-29 MED ORDER — TRAMADOL 50 MG TABLET
ORAL_TABLET | 1 refills | 0 days | Status: CP
Start: 2019-01-29 — End: ?

## 2019-01-29 MED ORDER — MIRTAZAPINE 15 MG TABLET
ORAL_TABLET | 0 refills | 0 days | Status: CP
Start: 2019-01-29 — End: ?

## 2019-04-05 MED ORDER — NITROGLYCERIN 0.4 MG SUBLINGUAL TABLET
ORAL_TABLET | SUBLINGUAL | 3 refills | 1 days | Status: CP | PRN
Start: 2019-04-05 — End: 2020-04-04

## 2019-04-20 DIAGNOSIS — I5043 Acute on chronic combined systolic (congestive) and diastolic (congestive) heart failure: Principal | ICD-10-CM

## 2019-04-20 MED ORDER — FUROSEMIDE 20 MG TABLET
ORAL_TABLET | Freq: Two times a day (BID) | ORAL | 11 refills | 30.00000 days | Status: CP
Start: 2019-04-20 — End: 2020-04-19

## 2019-04-26 ENCOUNTER — Encounter: Admit: 2019-04-26 | Discharge: 2019-04-27 | Payer: MEDICARE

## 2019-04-26 ENCOUNTER — Ambulatory Visit: Admit: 2019-04-26 | Discharge: 2019-04-27 | Payer: MEDICARE

## 2019-04-26 ENCOUNTER — Ambulatory Visit: Admit: 2019-04-26 | Discharge: 2019-04-27 | Payer: MEDICARE | Attending: Gerontology | Primary: Gerontology

## 2019-04-26 DIAGNOSIS — R5381 Other malaise: Principal | ICD-10-CM

## 2019-04-26 DIAGNOSIS — I5043 Acute on chronic combined systolic (congestive) and diastolic (congestive) heart failure: Principal | ICD-10-CM

## 2019-04-26 DIAGNOSIS — R443 Hallucinations, unspecified: Principal | ICD-10-CM

## 2019-04-26 DIAGNOSIS — R05 Cough: Principal | ICD-10-CM

## 2019-04-26 DIAGNOSIS — M79602 Pain in left arm: Principal | ICD-10-CM

## 2019-04-26 DIAGNOSIS — I2510 Atherosclerotic heart disease of native coronary artery without angina pectoris: Principal | ICD-10-CM

## 2019-04-26 DIAGNOSIS — J449 Chronic obstructive pulmonary disease, unspecified: Principal | ICD-10-CM

## 2019-04-26 DIAGNOSIS — M059 Rheumatoid arthritis with rheumatoid factor, unspecified: Principal | ICD-10-CM

## 2019-04-30 DIAGNOSIS — R9431 Abnormal electrocardiogram [ECG] [EKG]: Principal | ICD-10-CM

## 2019-05-04 ENCOUNTER — Encounter: Admit: 2019-05-04 | Discharge: 2019-05-05 | Payer: MEDICARE | Attending: Gerontology | Primary: Gerontology

## 2019-05-04 DIAGNOSIS — M79602 Pain in left arm: Principal | ICD-10-CM

## 2019-05-04 DIAGNOSIS — M059 Rheumatoid arthritis with rheumatoid factor, unspecified: Principal | ICD-10-CM

## 2019-05-04 DIAGNOSIS — I2510 Atherosclerotic heart disease of native coronary artery without angina pectoris: Principal | ICD-10-CM

## 2019-05-04 DIAGNOSIS — I5043 Acute on chronic combined systolic (congestive) and diastolic (congestive) heart failure: Principal | ICD-10-CM

## 2019-05-04 DIAGNOSIS — R443 Hallucinations, unspecified: Principal | ICD-10-CM

## 2019-05-04 DIAGNOSIS — J449 Chronic obstructive pulmonary disease, unspecified: Principal | ICD-10-CM

## 2019-05-07 ENCOUNTER — Encounter: Admit: 2019-05-07 | Discharge: 2019-05-08 | Payer: MEDICARE

## 2019-05-10 MED ORDER — ATORVASTATIN 40 MG TABLET
ORAL_TABLET | 3 refills | 0 days | Status: CP
Start: 2019-05-10 — End: ?

## 2019-05-31 MED ORDER — TAMSULOSIN 0.4 MG CAPSULE
ORAL_CAPSULE | Freq: Every day | ORAL | 3 refills | 90 days | Status: CP
Start: 2019-05-31 — End: 2020-05-30

## 2019-06-04 MED ORDER — FINASTERIDE 5 MG TABLET
ORAL_TABLET | Freq: Every day | ORAL | 3 refills | 90.00000 days | Status: CP
Start: 2019-06-04 — End: ?

## 2019-07-06 ENCOUNTER — Ambulatory Visit: Admit: 2019-07-06 | Discharge: 2019-07-07 | Payer: MEDICARE

## 2019-07-06 DIAGNOSIS — I639 Cerebral infarction, unspecified: Principal | ICD-10-CM

## 2019-07-06 DIAGNOSIS — M79602 Pain in left arm: Principal | ICD-10-CM

## 2019-07-06 DIAGNOSIS — R3912 Poor urinary stream: Secondary | ICD-10-CM

## 2019-07-06 DIAGNOSIS — R350 Frequency of micturition: Principal | ICD-10-CM

## 2019-07-06 DIAGNOSIS — Z7189 Other specified counseling: Principal | ICD-10-CM

## 2019-07-06 DIAGNOSIS — R634 Abnormal weight loss: Principal | ICD-10-CM

## 2019-07-06 DIAGNOSIS — N401 Enlarged prostate with lower urinary tract symptoms: Principal | ICD-10-CM

## 2019-07-06 DIAGNOSIS — M059 Rheumatoid arthritis with rheumatoid factor, unspecified: Principal | ICD-10-CM

## 2019-07-06 DIAGNOSIS — J449 Chronic obstructive pulmonary disease, unspecified: Principal | ICD-10-CM

## 2019-07-06 DIAGNOSIS — I5043 Acute on chronic combined systolic (congestive) and diastolic (congestive) heart failure: Principal | ICD-10-CM

## 2019-07-06 DIAGNOSIS — I10 Essential (primary) hypertension: Principal | ICD-10-CM

## 2019-07-06 MED ORDER — TRAMADOL 50 MG TABLET
ORAL_TABLET | Freq: Four times a day (QID) | ORAL | 1 refills | 8 days | Status: CP | PRN
Start: 2019-07-06 — End: ?

## 2019-07-06 MED ORDER — OMEPRAZOLE 20 MG CAPSULE,DELAYED RELEASE
ORAL_CAPSULE | Freq: Two times a day (BID) | ORAL | 3 refills | 90 days | Status: CP
Start: 2019-07-06 — End: 2020-07-05

## 2019-07-06 MED ORDER — TAMSULOSIN 0.4 MG CAPSULE
ORAL_CAPSULE | Freq: Every day | ORAL | 3 refills | 90 days | Status: CP
Start: 2019-07-06 — End: 2020-07-05

## 2019-07-07 MED ORDER — TRAMADOL 50 MG TABLET
ORAL_TABLET | Freq: Four times a day (QID) | ORAL | 1 refills | 8 days | Status: CP | PRN
Start: 2019-07-07 — End: ?

## 2019-08-06 ENCOUNTER — Ambulatory Visit: Admit: 2019-08-06 | Discharge: 2019-08-07 | Payer: MEDICARE | Attending: Family | Primary: Family

## 2019-08-09 ENCOUNTER — Encounter
Admit: 2019-08-09 | Discharge: 2019-08-09 | Payer: MEDICARE | Attending: Nurse Practitioner | Primary: Nurse Practitioner

## 2019-08-09 ENCOUNTER — Encounter: Admit: 2019-08-09 | Discharge: 2019-08-09 | Payer: MEDICARE

## 2019-08-23 DIAGNOSIS — K219 Gastro-esophageal reflux disease without esophagitis: Principal | ICD-10-CM

## 2019-08-23 DIAGNOSIS — K22710 Barrett's esophagus with low grade dysplasia: Principal | ICD-10-CM

## 2019-08-23 DIAGNOSIS — R1310 Dysphagia, unspecified: Principal | ICD-10-CM

## 2019-08-24 MED ORDER — MIRTAZAPINE 15 MG TABLET
ORAL_TABLET | 0 refills | 0 days | Status: CP
Start: 2019-08-24 — End: ?

## 2019-08-27 ENCOUNTER — Encounter
Admit: 2019-08-27 | Discharge: 2019-08-27 | Payer: MEDICARE | Attending: Nurse Practitioner | Primary: Nurse Practitioner

## 2019-08-27 ENCOUNTER — Encounter: Admit: 2019-08-27 | Discharge: 2019-08-27 | Payer: MEDICARE

## 2019-08-27 DIAGNOSIS — R252 Cramp and spasm: Principal | ICD-10-CM

## 2019-08-27 DIAGNOSIS — R0602 Shortness of breath: Principal | ICD-10-CM

## 2019-08-27 DIAGNOSIS — N401 Enlarged prostate with lower urinary tract symptoms: Principal | ICD-10-CM

## 2019-08-27 DIAGNOSIS — R300 Dysuria: Principal | ICD-10-CM

## 2019-08-27 DIAGNOSIS — R350 Frequency of micturition: Principal | ICD-10-CM

## 2019-08-27 DIAGNOSIS — D509 Iron deficiency anemia, unspecified: Principal | ICD-10-CM

## 2019-08-27 DIAGNOSIS — I639 Cerebral infarction, unspecified: Principal | ICD-10-CM

## 2019-08-27 DIAGNOSIS — I10 Essential (primary) hypertension: Principal | ICD-10-CM

## 2019-08-27 DIAGNOSIS — R3912 Poor urinary stream: Principal | ICD-10-CM

## 2019-08-27 DIAGNOSIS — I5043 Acute on chronic combined systolic (congestive) and diastolic (congestive) heart failure: Principal | ICD-10-CM

## 2019-08-27 DIAGNOSIS — R5383 Other fatigue: Principal | ICD-10-CM

## 2019-08-27 DIAGNOSIS — I2510 Atherosclerotic heart disease of native coronary artery without angina pectoris: Principal | ICD-10-CM

## 2019-09-27 MED ORDER — SPIRIVA WITH HANDIHALER 18 MCG AND INHALATION CAPSULES
ORAL_CAPSULE | 0 refills | 0 days | Status: CP
Start: 2019-09-27 — End: ?

## 2019-10-11 ENCOUNTER — Encounter
Admit: 2019-10-11 | Discharge: 2019-10-11 | Payer: MEDICARE | Attending: Certified Registered" | Primary: Certified Registered"

## 2019-10-11 ENCOUNTER — Encounter: Admit: 2019-10-11 | Discharge: 2019-10-11 | Payer: MEDICARE

## 2019-10-22 DIAGNOSIS — M059 Rheumatoid arthritis with rheumatoid factor, unspecified: Principal | ICD-10-CM

## 2019-10-22 MED ORDER — METHOTREXATE SODIUM 25 MG/ML INJECTION SOLUTION
SUBCUTANEOUS | 0 refills | 28.00000 days | Status: CP
Start: 2019-10-22 — End: ?

## 2019-10-25 DIAGNOSIS — R1310 Dysphagia, unspecified: Principal | ICD-10-CM

## 2019-10-25 DIAGNOSIS — K2270 Barrett's esophagus without dysplasia: Principal | ICD-10-CM

## 2019-10-25 DIAGNOSIS — K219 Gastro-esophageal reflux disease without esophagitis: Principal | ICD-10-CM

## 2019-10-27 ENCOUNTER — Encounter: Admit: 2019-10-27 | Discharge: 2019-10-28 | Payer: MEDICARE

## 2019-11-01 ENCOUNTER — Ambulatory Visit: Admit: 2019-11-01 | Discharge: 2019-11-02 | Payer: MEDICARE

## 2019-11-01 DIAGNOSIS — Z5181 Encounter for therapeutic drug level monitoring: Principal | ICD-10-CM

## 2019-11-01 DIAGNOSIS — M059 Rheumatoid arthritis with rheumatoid factor, unspecified: Principal | ICD-10-CM

## 2019-11-01 MED ORDER — METHOTREXATE SODIUM 25 MG/ML INJECTION SOLUTION
SUBCUTANEOUS | 2 refills | 28 days | Status: CP
Start: 2019-11-01 — End: ?

## 2019-11-01 MED ORDER — GABAPENTIN 100 MG CAPSULE
ORAL_CAPSULE | ORAL | 3 refills | 0.00000 days | Status: CP
Start: 2019-11-01 — End: ?

## 2019-11-01 MED ORDER — SULFASALAZINE 500 MG TABLET
ORAL_TABLET | Freq: Three times a day (TID) | ORAL | 0 refills | 90.00000 days | Status: CP
Start: 2019-11-01 — End: ?

## 2019-11-04 DIAGNOSIS — M059 Rheumatoid arthritis with rheumatoid factor, unspecified: Principal | ICD-10-CM

## 2019-11-05 DIAGNOSIS — M059 Rheumatoid arthritis with rheumatoid factor, unspecified: Principal | ICD-10-CM

## 2019-11-05 MED ORDER — METHOTREXATE SODIUM 25 MG/ML INJECTION SOLUTION
SUBCUTANEOUS | 2 refills | 28.00000 days | Status: CP
Start: 2019-11-05 — End: ?

## 2019-11-23 ENCOUNTER — Ambulatory Visit: Admit: 2019-11-23 | Discharge: 2019-11-23 | Payer: MEDICARE

## 2019-11-23 ENCOUNTER — Encounter: Admit: 2019-11-23 | Discharge: 2019-11-23 | Payer: MEDICARE | Attending: Audiologist | Primary: Audiologist

## 2019-11-23 DIAGNOSIS — J449 Chronic obstructive pulmonary disease, unspecified: Principal | ICD-10-CM

## 2019-11-23 DIAGNOSIS — N401 Enlarged prostate with lower urinary tract symptoms: Secondary | ICD-10-CM

## 2019-11-23 DIAGNOSIS — K2270 Barrett's esophagus without dysplasia: Principal | ICD-10-CM

## 2019-11-23 DIAGNOSIS — R3912 Poor urinary stream: Principal | ICD-10-CM

## 2019-11-23 DIAGNOSIS — Z5181 Encounter for therapeutic drug level monitoring: Principal | ICD-10-CM

## 2019-11-23 DIAGNOSIS — M059 Rheumatoid arthritis with rheumatoid factor, unspecified: Principal | ICD-10-CM

## 2019-11-23 DIAGNOSIS — I5043 Acute on chronic combined systolic (congestive) and diastolic (congestive) heart failure: Principal | ICD-10-CM

## 2019-11-23 DIAGNOSIS — I639 Cerebral infarction, unspecified: Principal | ICD-10-CM

## 2019-12-02 ENCOUNTER — Ambulatory Visit: Admit: 2019-12-02 | Payer: MEDICARE | Attending: Audiologist | Primary: Audiologist

## 2019-12-21 DIAGNOSIS — M059 Rheumatoid arthritis with rheumatoid factor, unspecified: Principal | ICD-10-CM

## 2019-12-21 MED ORDER — METHOTREXATE SODIUM 25 MG/ML INJECTION SOLUTION
SUBCUTANEOUS | 2 refills | 28.00000 days | Status: CP
Start: 2019-12-21 — End: ?

## 2020-01-06 MED ORDER — MIRTAZAPINE 15 MG TABLET
ORAL_TABLET | 3 refills | 0 days | Status: CP
Start: 2020-01-06 — End: ?

## 2020-01-10 ENCOUNTER — Ambulatory Visit: Admit: 2020-01-10 | Discharge: 2020-01-10 | Payer: MEDICARE

## 2020-01-10 ENCOUNTER — Encounter
Admit: 2020-01-10 | Discharge: 2020-01-10 | Payer: MEDICARE | Attending: Certified Registered" | Primary: Certified Registered"

## 2020-01-17 MED ORDER — TRAMADOL 50 MG TABLET
ORAL_TABLET | 3 refills | 0 days | Status: CP
Start: 2020-01-17 — End: ?

## 2020-01-17 MED ORDER — FOLIC ACID 1 MG TABLET
ORAL_TABLET | 3 refills | 0 days | Status: CP
Start: 2020-01-17 — End: ?

## 2020-01-20 ENCOUNTER — Ambulatory Visit: Admit: 2020-01-20 | Discharge: 2020-01-21 | Payer: MEDICARE | Attending: Audiologist | Primary: Audiologist

## 2020-01-25 DIAGNOSIS — K219 Gastro-esophageal reflux disease without esophagitis: Principal | ICD-10-CM

## 2020-01-25 DIAGNOSIS — Z8719 Personal history of other diseases of the digestive system: Principal | ICD-10-CM

## 2020-03-07 ENCOUNTER — Ambulatory Visit: Admit: 2020-03-07 | Discharge: 2020-03-08 | Payer: MEDICARE

## 2020-03-07 ENCOUNTER — Encounter: Admit: 2020-03-07 | Discharge: 2020-03-08 | Payer: MEDICARE

## 2020-03-07 ENCOUNTER — Encounter: Admit: 2020-03-07 | Discharge: 2020-03-08 | Payer: MEDICARE | Attending: Gerontology | Primary: Gerontology

## 2020-03-07 DIAGNOSIS — W1781XA Fall down embankment (hill), initial encounter: Principal | ICD-10-CM

## 2020-03-07 DIAGNOSIS — M542 Cervicalgia: Principal | ICD-10-CM

## 2020-03-07 DIAGNOSIS — J449 Chronic obstructive pulmonary disease, unspecified: Principal | ICD-10-CM

## 2020-03-07 DIAGNOSIS — R0602 Shortness of breath: Principal | ICD-10-CM

## 2020-03-07 DIAGNOSIS — I5042 Chronic combined systolic (congestive) and diastolic (congestive) heart failure: Principal | ICD-10-CM

## 2020-03-07 DIAGNOSIS — I2510 Atherosclerotic heart disease of native coronary artery without angina pectoris: Principal | ICD-10-CM

## 2020-03-07 DIAGNOSIS — R519 Occipital pain: Principal | ICD-10-CM

## 2020-03-16 ENCOUNTER — Encounter: Admit: 2020-03-16 | Discharge: 2020-03-17 | Payer: MEDICARE

## 2020-03-16 DIAGNOSIS — I2510 Atherosclerotic heart disease of native coronary artery without angina pectoris: Principal | ICD-10-CM

## 2020-03-16 DIAGNOSIS — R002 Palpitations: Principal | ICD-10-CM

## 2020-04-19 ENCOUNTER — Encounter: Admit: 2020-04-19 | Discharge: 2020-04-20 | Payer: MEDICARE

## 2020-04-19 DIAGNOSIS — R002 Palpitations: Principal | ICD-10-CM

## 2020-04-19 DIAGNOSIS — I2510 Atherosclerotic heart disease of native coronary artery without angina pectoris: Principal | ICD-10-CM

## 2020-04-24 ENCOUNTER — Encounter: Admit: 2020-04-24 | Discharge: 2020-04-24 | Payer: MEDICARE

## 2020-04-24 DIAGNOSIS — F419 Anxiety disorder, unspecified: Principal | ICD-10-CM

## 2020-04-24 DIAGNOSIS — Z5181 Encounter for therapeutic drug level monitoring: Principal | ICD-10-CM

## 2020-04-24 DIAGNOSIS — J449 Chronic obstructive pulmonary disease, unspecified: Principal | ICD-10-CM

## 2020-04-24 DIAGNOSIS — Z7982 Long term (current) use of aspirin: Principal | ICD-10-CM

## 2020-04-24 DIAGNOSIS — Z79899 Other long term (current) drug therapy: Principal | ICD-10-CM

## 2020-04-24 DIAGNOSIS — I110 Hypertensive heart disease with heart failure: Principal | ICD-10-CM

## 2020-04-24 DIAGNOSIS — R0602 Shortness of breath: Principal | ICD-10-CM

## 2020-04-24 DIAGNOSIS — M059 Rheumatoid arthritis with rheumatoid factor, unspecified: Principal | ICD-10-CM

## 2020-04-24 DIAGNOSIS — I2510 Atherosclerotic heart disease of native coronary artery without angina pectoris: Principal | ICD-10-CM

## 2020-04-24 DIAGNOSIS — I5032 Chronic diastolic (congestive) heart failure: Principal | ICD-10-CM

## 2020-04-24 MED ORDER — SULFASALAZINE 500 MG TABLET
ORAL_TABLET | Freq: Three times a day (TID) | ORAL | 0 refills | 90 days | Status: CP
Start: 2020-04-24 — End: 2020-07-07

## 2020-04-24 MED ORDER — METHOTREXATE SODIUM 25 MG/ML INJECTION SOLUTION
SUBCUTANEOUS | 5 refills | 28.00000 days | Status: CP
Start: 2020-04-24 — End: ?

## 2020-04-24 MED ORDER — FOLIC ACID 1 MG TABLET
ORAL_TABLET | Freq: Every day | ORAL | 3 refills | 12.00000 days | Status: CP
Start: 2020-04-24 — End: ?

## 2020-04-27 DIAGNOSIS — J449 Chronic obstructive pulmonary disease, unspecified: Principal | ICD-10-CM

## 2020-04-28 MED ORDER — ALBUTEROL SULFATE HFA 90 MCG/ACTUATION AEROSOL INHALER
0 refills | 0 days | Status: CP
Start: 2020-04-28 — End: ?

## 2020-05-01 ENCOUNTER — Ambulatory Visit
Admit: 2020-05-01 | Discharge: 2020-05-03 | Disposition: A | Discharge status: Home / Self Care | Payer: MEDICARE | Source: Ambulatory Visit | Admitting: Geriatric Medicine

## 2020-05-01 ENCOUNTER — Encounter: Admit: 2020-05-01 | Discharge: 2020-05-02 | Payer: MEDICARE

## 2020-05-01 DIAGNOSIS — R0600 Dyspnea, unspecified: Principal | ICD-10-CM

## 2020-05-01 DIAGNOSIS — I5043 Acute on chronic combined systolic (congestive) and diastolic (congestive) heart failure: Principal | ICD-10-CM

## 2020-05-01 DIAGNOSIS — J9601 Acute respiratory failure with hypoxia: Principal | ICD-10-CM

## 2020-05-01 DIAGNOSIS — R0902 Hypoxemia: Principal | ICD-10-CM

## 2020-05-01 DIAGNOSIS — I351 Nonrheumatic aortic (valve) insufficiency: Principal | ICD-10-CM

## 2020-05-01 DIAGNOSIS — I2510 Atherosclerotic heart disease of native coronary artery without angina pectoris: Principal | ICD-10-CM

## 2020-05-01 DIAGNOSIS — J849 Interstitial pulmonary disease, unspecified: Principal | ICD-10-CM

## 2020-05-01 DIAGNOSIS — Z8673 Personal history of transient ischemic attack (TIA), and cerebral infarction without residual deficits: Principal | ICD-10-CM

## 2020-05-01 DIAGNOSIS — R338 Other retention of urine: Principal | ICD-10-CM

## 2020-05-01 DIAGNOSIS — M069 Rheumatoid arthritis, unspecified: Principal | ICD-10-CM

## 2020-05-01 DIAGNOSIS — N401 Enlarged prostate with lower urinary tract symptoms: Principal | ICD-10-CM

## 2020-05-01 DIAGNOSIS — K219 Gastro-esophageal reflux disease without esophagitis: Principal | ICD-10-CM

## 2020-05-01 DIAGNOSIS — Z87891 Personal history of nicotine dependence: Principal | ICD-10-CM

## 2020-05-01 DIAGNOSIS — I110 Hypertensive heart disease with heart failure: Principal | ICD-10-CM

## 2020-05-01 DIAGNOSIS — I25118 Atherosclerotic heart disease of native coronary artery with other forms of angina pectoris: Principal | ICD-10-CM

## 2020-05-01 DIAGNOSIS — R079 Chest pain, unspecified: Principal | ICD-10-CM

## 2020-05-01 DIAGNOSIS — J449 Chronic obstructive pulmonary disease, unspecified: Principal | ICD-10-CM

## 2020-05-02 DIAGNOSIS — I110 Hypertensive heart disease with heart failure: Principal | ICD-10-CM

## 2020-05-02 DIAGNOSIS — I25118 Atherosclerotic heart disease of native coronary artery with other forms of angina pectoris: Principal | ICD-10-CM

## 2020-05-02 DIAGNOSIS — I5043 Acute on chronic combined systolic (congestive) and diastolic (congestive) heart failure: Principal | ICD-10-CM

## 2020-05-02 DIAGNOSIS — Z8673 Personal history of transient ischemic attack (TIA), and cerebral infarction without residual deficits: Principal | ICD-10-CM

## 2020-05-02 DIAGNOSIS — R338 Other retention of urine: Principal | ICD-10-CM

## 2020-05-02 DIAGNOSIS — J449 Chronic obstructive pulmonary disease, unspecified: Principal | ICD-10-CM

## 2020-05-02 DIAGNOSIS — Z87891 Personal history of nicotine dependence: Principal | ICD-10-CM

## 2020-05-02 DIAGNOSIS — K219 Gastro-esophageal reflux disease without esophagitis: Principal | ICD-10-CM

## 2020-05-02 DIAGNOSIS — R079 Chest pain, unspecified: Principal | ICD-10-CM

## 2020-05-02 DIAGNOSIS — J9601 Acute respiratory failure with hypoxia: Principal | ICD-10-CM

## 2020-05-02 DIAGNOSIS — I2510 Atherosclerotic heart disease of native coronary artery without angina pectoris: Principal | ICD-10-CM

## 2020-05-02 DIAGNOSIS — M069 Rheumatoid arthritis, unspecified: Principal | ICD-10-CM

## 2020-05-02 DIAGNOSIS — N401 Enlarged prostate with lower urinary tract symptoms: Principal | ICD-10-CM

## 2020-05-02 DIAGNOSIS — I351 Nonrheumatic aortic (valve) insufficiency: Principal | ICD-10-CM

## 2020-05-02 DIAGNOSIS — J849 Interstitial pulmonary disease, unspecified: Principal | ICD-10-CM

## 2020-05-03 MED ORDER — NITROGLYCERIN 0.4 MG SUBLINGUAL TABLET
ORAL_TABLET | SUBLINGUAL | 0 refills | 1 days | Status: CP | PRN
Start: 2020-05-03 — End: 2021-05-03

## 2020-05-06 MED ORDER — FUROSEMIDE 20 MG TABLET
ORAL_TABLET | Freq: Every day | ORAL | 0 refills | 30.00000 days | Status: CP
Start: 2020-05-06 — End: 2020-07-10

## 2020-05-25 MED ORDER — FINASTERIDE 5 MG TABLET
ORAL_TABLET | 3 refills | 0 days | Status: CP
Start: 2020-05-25 — End: ?

## 2020-05-25 MED ORDER — OMEPRAZOLE 20 MG CAPSULE,DELAYED RELEASE
ORAL_CAPSULE | ORAL | 3 refills | 0.00000 days | Status: CP
Start: 2020-05-25 — End: ?

## 2020-05-30 ENCOUNTER — Ambulatory Visit: Admit: 2020-05-30 | Discharge: 2020-05-30 | Payer: MEDICARE

## 2020-05-30 ENCOUNTER — Encounter: Admit: 2020-05-30 | Discharge: 2020-05-30 | Payer: MEDICARE

## 2020-05-30 DIAGNOSIS — I5030 Unspecified diastolic (congestive) heart failure: Principal | ICD-10-CM

## 2020-05-30 DIAGNOSIS — I5042 Chronic combined systolic (congestive) and diastolic (congestive) heart failure: Principal | ICD-10-CM

## 2020-05-30 DIAGNOSIS — I639 Cerebral infarction, unspecified: Principal | ICD-10-CM

## 2020-05-30 DIAGNOSIS — M059 Rheumatoid arthritis with rheumatoid factor, unspecified: Principal | ICD-10-CM

## 2020-05-30 DIAGNOSIS — I2510 Atherosclerotic heart disease of native coronary artery without angina pectoris: Principal | ICD-10-CM

## 2020-05-30 DIAGNOSIS — I10 Essential (primary) hypertension: Principal | ICD-10-CM

## 2020-05-30 DIAGNOSIS — J449 Chronic obstructive pulmonary disease, unspecified: Principal | ICD-10-CM

## 2020-05-30 DIAGNOSIS — K2270 Barrett's esophagus without dysplasia: Principal | ICD-10-CM

## 2020-05-30 DIAGNOSIS — R3912 Poor urinary stream: Principal | ICD-10-CM

## 2020-05-30 DIAGNOSIS — N401 Enlarged prostate with lower urinary tract symptoms: Principal | ICD-10-CM

## 2020-05-30 MED ORDER — CARVEDILOL 6.25 MG TABLET
ORAL_TABLET | Freq: Two times a day (BID) | ORAL | 3 refills | 90 days | Status: CP
Start: 2020-05-30 — End: ?

## 2020-06-13 ENCOUNTER — Encounter
Admit: 2020-06-13 | Discharge: 2020-06-14 | Payer: MEDICARE | Attending: Cardiovascular Disease | Primary: Cardiovascular Disease

## 2020-06-13 DIAGNOSIS — R079 Chest pain, unspecified: Principal | ICD-10-CM

## 2020-06-22 ENCOUNTER — Encounter: Admit: 2020-06-22 | Discharge: 2020-07-05 | Payer: MEDICARE

## 2020-06-22 ENCOUNTER — Encounter: Admit: 2020-06-22 | Discharge: 2020-07-21 | Payer: MEDICARE

## 2020-06-22 ENCOUNTER — Ambulatory Visit: Admit: 2020-06-22 | Discharge: 2020-07-21 | Payer: MEDICARE

## 2020-06-22 DIAGNOSIS — I10 Essential (primary) hypertension: Principal | ICD-10-CM

## 2020-06-22 DIAGNOSIS — E785 Hyperlipidemia, unspecified: Principal | ICD-10-CM

## 2020-06-22 DIAGNOSIS — I2510 Atherosclerotic heart disease of native coronary artery without angina pectoris: Principal | ICD-10-CM

## 2020-06-22 DIAGNOSIS — R079 Chest pain, unspecified: Principal | ICD-10-CM

## 2020-06-22 DIAGNOSIS — R0600 Dyspnea, unspecified: Principal | ICD-10-CM

## 2020-07-07 DIAGNOSIS — M059 Rheumatoid arthritis with rheumatoid factor, unspecified: Principal | ICD-10-CM

## 2020-07-07 MED ORDER — SULFASALAZINE 500 MG TABLET
ORAL_TABLET | Freq: Three times a day (TID) | ORAL | 0 refills | 90.00000 days | Status: CP
Start: 2020-07-07 — End: ?

## 2020-07-10 ENCOUNTER — Encounter: Admit: 2020-07-10 | Discharge: 2020-07-10 | Payer: MEDICARE | Attending: Anesthesiology | Primary: Anesthesiology

## 2020-07-10 ENCOUNTER — Encounter: Admit: 2020-07-10 | Discharge: 2020-07-10 | Payer: MEDICARE

## 2020-07-12 MED ORDER — ATORVASTATIN 40 MG TABLET
ORAL_TABLET | 3 refills | 0 days | Status: CP
Start: 2020-07-12 — End: ?

## 2020-07-14 DIAGNOSIS — M059 Rheumatoid arthritis with rheumatoid factor, unspecified: Principal | ICD-10-CM

## 2020-07-14 MED ORDER — SULFASALAZINE 500 MG TABLET
ORAL_TABLET | Freq: Three times a day (TID) | ORAL | 0 refills | 90 days | Status: CP
Start: 2020-07-14 — End: ?

## 2020-07-24 DIAGNOSIS — R1310 Dysphagia, unspecified: Principal | ICD-10-CM

## 2020-07-24 DIAGNOSIS — Z8719 Personal history of other diseases of the digestive system: Principal | ICD-10-CM

## 2020-07-24 DIAGNOSIS — K219 Gastro-esophageal reflux disease without esophagitis: Principal | ICD-10-CM

## 2020-07-26 ENCOUNTER — Encounter: Admit: 2020-07-26 | Discharge: 2020-07-27 | Payer: MEDICARE

## 2020-07-26 ENCOUNTER — Ambulatory Visit: Admit: 2020-07-26 | Discharge: 2020-07-27 | Payer: MEDICARE | Attending: Internal Medicine | Primary: Internal Medicine

## 2020-07-26 DIAGNOSIS — M25511 Pain in right shoulder: Principal | ICD-10-CM

## 2020-07-26 DIAGNOSIS — M67911 Unspecified disorder of synovium and tendon, right shoulder: Principal | ICD-10-CM

## 2020-07-26 DIAGNOSIS — M19011 Primary osteoarthritis, right shoulder: Principal | ICD-10-CM

## 2020-07-26 MED ORDER — CYCLOBENZAPRINE 5 MG TABLET
ORAL_TABLET | Freq: Every evening | ORAL | 0 refills | 10 days | Status: CP | PRN
Start: 2020-07-26 — End: ?

## 2020-08-02 DIAGNOSIS — M25511 Pain in right shoulder: Principal | ICD-10-CM

## 2020-08-02 MED ORDER — TRAMADOL 50 MG TABLET
ORAL_TABLET | Freq: Four times a day (QID) | ORAL | 1 refills | 8.00000 days | Status: CP | PRN
Start: 2020-08-02 — End: 2020-10-01

## 2020-08-15 ENCOUNTER — Ambulatory Visit
Admit: 2020-08-15 | Discharge: 2020-08-15 | Payer: MEDICARE | Attending: Cardiovascular Disease | Primary: Cardiovascular Disease

## 2020-08-15 DIAGNOSIS — I359 Nonrheumatic aortic valve disorder, unspecified: Principal | ICD-10-CM

## 2020-08-15 DIAGNOSIS — I10 Essential (primary) hypertension: Principal | ICD-10-CM

## 2020-08-15 MED ORDER — LOSARTAN 25 MG TABLET
ORAL_TABLET | Freq: Every day | ORAL | 6 refills | 30 days | Status: CP
Start: 2020-08-15 — End: 2020-09-14

## 2020-08-17 DIAGNOSIS — J449 Chronic obstructive pulmonary disease, unspecified: Principal | ICD-10-CM

## 2020-08-18 MED ORDER — ALBUTEROL SULFATE HFA 90 MCG/ACTUATION AEROSOL INHALER
0 refills | 0 days | Status: CP
Start: 2020-08-18 — End: ?

## 2020-08-22 ENCOUNTER — Encounter
Admit: 2020-08-22 | Discharge: 2020-09-20 | Payer: MEDICARE | Attending: Rehabilitative and Restorative Service Providers" | Primary: Rehabilitative and Restorative Service Providers"

## 2020-08-22 ENCOUNTER — Ambulatory Visit
Admit: 2020-08-22 | Discharge: 2020-09-20 | Payer: MEDICARE | Attending: Rehabilitative and Restorative Service Providers" | Primary: Rehabilitative and Restorative Service Providers"

## 2020-09-01 DIAGNOSIS — J449 Chronic obstructive pulmonary disease, unspecified: Principal | ICD-10-CM

## 2020-09-09 DIAGNOSIS — M059 Rheumatoid arthritis with rheumatoid factor, unspecified: Principal | ICD-10-CM

## 2020-09-11 ENCOUNTER — Encounter: Admit: 2020-09-11 | Discharge: 2020-09-12 | Payer: MEDICARE

## 2020-09-21 ENCOUNTER — Ambulatory Visit
Admit: 2020-09-21 | Discharge: 2020-10-20 | Payer: MEDICARE | Attending: Rehabilitative and Restorative Service Providers" | Primary: Rehabilitative and Restorative Service Providers"

## 2020-09-21 ENCOUNTER — Encounter
Admit: 2020-09-21 | Discharge: 2020-10-20 | Payer: MEDICARE | Attending: Rehabilitative and Restorative Service Providers" | Primary: Rehabilitative and Restorative Service Providers"

## 2020-10-13 DIAGNOSIS — I2510 Atherosclerotic heart disease of native coronary artery without angina pectoris: Principal | ICD-10-CM

## 2020-10-13 MED ORDER — NITROGLYCERIN 0.4 MG SUBLINGUAL TABLET
ORAL_TABLET | SUBLINGUAL | 0 refills | 1 days | Status: CP | PRN
Start: 2020-10-13 — End: 2021-10-13

## 2020-10-25 ENCOUNTER — Encounter: Admit: 2020-10-25 | Discharge: 2020-10-25 | Payer: MEDICARE

## 2020-10-25 ENCOUNTER — Encounter: Admit: 2020-10-25 | Discharge: 2020-10-25 | Payer: MEDICARE | Attending: Family | Primary: Family

## 2020-10-25 DIAGNOSIS — Z5181 Encounter for therapeutic drug level monitoring: Principal | ICD-10-CM

## 2020-10-25 DIAGNOSIS — M059 Rheumatoid arthritis with rheumatoid factor, unspecified: Principal | ICD-10-CM

## 2020-10-25 MED ORDER — SULFASALAZINE 500 MG TABLET
ORAL_TABLET | Freq: Three times a day (TID) | ORAL | 0 refills | 90 days | Status: CP
Start: 2020-10-25 — End: 2021-01-23

## 2020-10-25 MED ORDER — METHOTREXATE SODIUM 25 MG/ML INJECTION SOLUTION
SUBCUTANEOUS | 0 refills | 84 days | Status: CP
Start: 2020-10-25 — End: 2021-01-23

## 2020-10-31 ENCOUNTER — Ambulatory Visit: Admit: 2020-10-31 | Discharge: 2020-11-01 | Payer: MEDICARE

## 2020-10-31 DIAGNOSIS — I2510 Atherosclerotic heart disease of native coronary artery without angina pectoris: Principal | ICD-10-CM

## 2020-10-31 DIAGNOSIS — S46011S Strain of muscle(s) and tendon(s) of the rotator cuff of right shoulder, sequela: Principal | ICD-10-CM

## 2020-10-31 DIAGNOSIS — R3912 Poor urinary stream: Principal | ICD-10-CM

## 2020-10-31 DIAGNOSIS — E785 Hyperlipidemia, unspecified: Principal | ICD-10-CM

## 2020-10-31 DIAGNOSIS — K2270 Barrett's esophagus without dysplasia: Principal | ICD-10-CM

## 2020-10-31 DIAGNOSIS — M059 Rheumatoid arthritis with rheumatoid factor, unspecified: Principal | ICD-10-CM

## 2020-10-31 DIAGNOSIS — J449 Chronic obstructive pulmonary disease, unspecified: Principal | ICD-10-CM

## 2020-10-31 DIAGNOSIS — N401 Enlarged prostate with lower urinary tract symptoms: Principal | ICD-10-CM

## 2020-10-31 DIAGNOSIS — I5042 Chronic combined systolic (congestive) and diastolic (congestive) heart failure: Principal | ICD-10-CM

## 2020-10-31 MED ORDER — DOXYCYCLINE HYCLATE 100 MG CAPSULE
ORAL_CAPSULE | Freq: Two times a day (BID) | ORAL | 0 refills | 7.00000 days | Status: CP
Start: 2020-10-31 — End: 2020-11-07

## 2020-10-31 MED ORDER — ALBUTEROL SULFATE HFA 90 MCG/ACTUATION AEROSOL INHALER
RESPIRATORY_TRACT | 3 refills | 0.00000 days | Status: CP | PRN
Start: 2020-10-31 — End: ?

## 2020-11-07 DIAGNOSIS — M25511 Pain in right shoulder: Principal | ICD-10-CM

## 2020-11-08 MED ORDER — GABAPENTIN 100 MG CAPSULE
ORAL_CAPSULE | Freq: Three times a day (TID) | ORAL | 3 refills | 90.00000 days | Status: CP
Start: 2020-11-08 — End: ?

## 2020-11-24 ENCOUNTER — Ambulatory Visit
Admission: EM | Admit: 2020-11-24 | Discharge: 2020-11-24 | Disposition: A | Discharge status: Home / Self Care | Payer: Medicare Other

## 2020-11-24 ENCOUNTER — Other Ambulatory Visit: Payer: Self-pay

## 2020-11-24 ENCOUNTER — Ambulatory Visit (INDEPENDENT_AMBULATORY_CARE_PROVIDER_SITE_OTHER): Payer: Medicare Other

## 2020-11-24 DIAGNOSIS — S40862A Insect bite (nonvenomous) of left upper arm, initial encounter: Secondary | ICD-10-CM | POA: Diagnosis not present

## 2020-11-24 DIAGNOSIS — L03114 Cellulitis of left upper limb: Secondary | ICD-10-CM

## 2020-11-24 DIAGNOSIS — W57XXXA Bitten or stung by nonvenomous insect and other nonvenomous arthropods, initial encounter: Secondary | ICD-10-CM

## 2020-11-24 DIAGNOSIS — R5383 Other fatigue: Secondary | ICD-10-CM | POA: Diagnosis not present

## 2020-11-24 DIAGNOSIS — R0602 Shortness of breath: Secondary | ICD-10-CM | POA: Diagnosis not present

## 2020-11-24 MED ORDER — DOXYCYCLINE HYCLATE 100 MG PO CAPS: 100.0000 mg | ORAL_CAPSULE | Freq: Two times a day (BID) | ORAL | 0 refills | Status: AC

## 2020-11-24 MED ORDER — TRIAMCINOLONE ACETONIDE 0.1 % EX CREA: 1.0000 "application " | TOPICAL_CREAM | Freq: Two times a day (BID) | CUTANEOUS | 0 refills | Status: DC

## 2020-11-24 NOTE — ED Provider Notes (Signed)
[facility]    CSN: [id number] Arrival date & time: 11/24/20  1124      History   Chief Complaint Chief Complaint  Patient presents with   Tick Removal    HPI Dennis [name] is a 75 y.o. male history of hypertension, hyperlipidemia, presenting today for evaluation of possible insect bite.  Reports 2 possible insect bites to left upper arm and left axilla.  First noticed this approximately 3 days ago, reports that they have become more painful and swollen over the past few days.  He reports associated fatigue as well as shortness of breath including at rest.  Denies any cough.  Denies fevers chills or body aches.  Denies any other rashes or lesions to skin.  Denies URI symptoms.  Denies GI symptoms of nausea vomiting abdominal pain or diarrhea.  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 Authorizing Provider  atorvastatin (LIPITOR) 40 MG tablet Take 1 tablet by mouth daily. 07/12/20  Yes [provider]  doxycycline (VIBRAMYCIN) 100 MG capsule Take 1 capsule (100 mg total) by mouth 2 (two) times daily for 10 days. 11/24/20 12/04/20 Yes Brody Kump C, PA-C  losartan (COZAAR) 25 MG tablet Take 1 tablet by mouth daily. 08/15/20  Yes [provider]  methotrexate 250 MG/10ML injection Inject into the skin. 10/25/20 01/23/21 Yes [provider]  mirtazapine (REMERON) 15 MG tablet TAKE 1/2 (ONE-HALF) TABLET BY MOUTH AT BEDTIME FOR ANXIETY 01/06/20  Yes [provider]  sulfaSALAzine (AZULFIDINE) 500  MG tablet Take by mouth. 10/25/20 01/23/21 Yes [provider]  triamcinolone cream (KENALOG) 0.1 % Apply 1 application topically 2 (two) times daily. 11/24/20  Yes Rishi Vicario C, PA-C  carvedilol (COREG) 3.125 MG tablet Take by mouth.    [provider]  clonazePAM (KLONOPIN) 0.5 MG tablet Take by mouth.    [provider]  clopidogrel (PLAVIX) 75 MG tablet Take by mouth.    [provider]  finasteride (PROSCAR) 5 MG tablet Take by mouth.    [provider]  folic acid (FOLVITE) 1 MG tablet Take 1 mg by mouth daily. 08/02/20   [provider]  gabapentin (NEURONTIN) 100 MG capsule Take 100 mg by mouth 3 (three) times daily. 11/08/20   [provider]  omeprazole (PRILOSEC) 20 MG capsule Take 20 mg by mouth 2 (two) times daily. 09/24/20   [provider]  tamsulosin (FLOMAX) 0.4 MG CAPS capsule Take by mouth.    [provider]  traMADol (ULTRAM) 50 MG tablet Take by mouth.    [provider]    Family History History reviewed. No pertinent family history.  Social History Social History   Tobacco Use   Smoking status: Former    Pack years: 0.00   Smokeless tobacco: Never  Vaping Use   Vaping Use: Never used  Substance Use Topics   Alcohol use: No   Drug use: No     Allergies  Codeine and Shellfish allergy   Review of Systems Review of Systems  Constitutional:  Positive for fatigue. Negative for fever.  Eyes:  Negative for redness, itching and visual disturbance.  Respiratory:  Positive for shortness of breath.   Cardiovascular:  Negative for chest pain and leg swelling.  Gastrointestinal:  Negative for nausea and vomiting.  Musculoskeletal:  Negative for arthralgias and myalgias.  Skin:  Positive for color change. Negative for rash and wound.  Neurological:  Negative for dizziness, syncope, weakness, light-headedness and headaches.    Physical Exam Triage Vital Signs ED Triage Vitals   Enc Vitals Group     BP      Pulse      Resp      Temp      Temp src      SpO2      Weight      Height      Head Circumference      Peak Flow      Pain Score      Pain Loc      Pain Edu?      Excl. in GC?    No data found.  Updated Vital Signs BP (!) 147/67 (BP Location: Right Arm)   Pulse 94   Temp 98.8 F (37.1 C) (Oral)   Resp 18   SpO2 94%   Visual Acuity Right Eye Distance:   Left Eye Distance:   Bilateral Distance:    Right Eye Near:   Left Eye Near:    Bilateral Near:     Physical Exam Vitals and nursing note reviewed.  Constitutional:      Appearance: He is well-developed.     Comments: No acute distress  HENT:     Head: Normocephalic and atraumatic.     Nose: Nose normal.     Mouth/Throat:     Comments: Oral mucosa pink and slightly dry, no tonsillar enlargement or exudate. Posterior pharynx patent and nonerythematous, no uvula deviation or swelling. Normal phonation.  Eyes:     Conjunctiva/sclera: Conjunctivae normal.  Cardiovascular:     Rate and Rhythm: Normal rate and regular rhythm.  Pulmonary:     Effort: Pulmonary effort is normal. No respiratory distress.     Comments: Breathing comfortably at rest, CTABL, no wheezing, rales or other adventitious sounds auscultated   Abdominal:     General: There is no distension.  Musculoskeletal:        General: Normal range of motion.     Cervical back: Neck supple.  Skin:    General: Skin is warm and dry.     Comments: Left upper arm and left axilla with  1 cm areas of erythema and mild induration with associated tenderness, no fluctuance, mild overlying central skin peeling  Neurological:     Mental Status: He is alert and oriented to person, place, and time.     UC Treatments / Results  Labs (all labs ordered are listed, but only abnormal results are displayed) Labs Reviewed - No data to display  EKG   Radiology DG Chest 2 View  Result Date: 11/24/2020 CLINICAL DATA:  Shortness of  breath and fatigue. EXAM: CHEST - 2 VIEW COMPARISON:  01/22/2008. FINDINGS: The heart size and mediastinal contours are within normal limits. Calcific atherosclerosis of the aorta. Chronic prominence of the interstitial markings. No consolidation. No visible pleural effusions or pneumothorax. No acute osseous abnormality. Osteopenia. Polyarticular degenerative change. IMPRESSION: Chronic lung changes without evidence of acute cardiopulmonary disease.  Electronically Signed   By: Feliberto Harts MD   On: 11/24/2020 13:59    Procedures Procedures (including critical care time)  Medications Ordered in UC Medications - No data to display  Initial Impression / Assessment and Plan / UC Course  I have reviewed the triage vital signs and the nursing notes.  Pertinent labs & imaging results that were available during my care of the patient were reviewed by me and considered in my medical decision making (see chart for details).     Left arm bites-suspicious of tick bites, slightly concerning for associated cellulitis, covering with doxycycline, triamcinolone topically and monitoring for resolution.  No other symptoms of RMSF or Lyme other than fatigue.  Continue to monitor, discussed signs and symptoms to follow-up Shortness of breath/fatigue-x-ray negative for any signs of pneumonia, does show chronic changes, continue to follow-up with PCP as planned for follow-up CT.  Discussed strict return precautions. Patient verbalized understanding and is agreeable with plan.  Final Clinical Impressions(s) / UC Diagnoses   Final diagnoses:  Tick bite of left upper arm, initial encounter  Cellulitis of left upper extremity  Fatigue, unspecified type  Shortness of breath     Discharge Instructions      Begin doxycycline twice daily x10 days Use triamcinolone cream twice daily to area around bites to help with itching/inflammation Warm compresses Follow-up if not resolving Please follow-up with your  primary care as planned for further evaluation of fatigue/shortness of breath Please return if symptoms worsening     ED Prescriptions     Medication Sig Dispense Auth. Provider   doxycycline (VIBRAMYCIN) 100 MG capsule Take 1 capsule (100 mg total) by mouth 2 (two) times daily for 10 days. 20 capsule Jakaria Lavergne C, PA-C   triamcinolone cream (KENALOG) 0.1 % Apply 1 application topically 2 (two) times daily. 30 g Emina Ribaudo, [place] C, PA-C      PDMP not reviewed this encounter.   Lew Dawes, [place] 11/24/20 1423

## 2020-11-24 NOTE — ED Triage Notes (Signed)
Four or five days ago Pt removed a tick from the right side of his neck. Three days ago, Pt noticed a swollen red lesion on his posterior left arm. Pt has applied peroxide to both lesions.  Pt reports that he has been feeling fatigued since the tick bites.

## 2020-11-24 NOTE — Discharge Instructions (Signed)
Begin doxycycline twice daily x10 days Use triamcinolone cream twice daily to area around bites to help with itching/inflammation Warm compresses Follow-up if not resolving Please follow-up with your primary care as planned for further evaluation of fatigue/shortness of breath Please return if symptoms worsening

## 2020-12-12 MED ORDER — MIRTAZAPINE 15 MG TABLET
ORAL_TABLET | ORAL | 1 refills | 0.00000 days | Status: CP
Start: 2020-12-12 — End: ?

## 2021-01-08 MED ORDER — TAMSULOSIN 0.4 MG CAPSULE
ORAL_CAPSULE | 3 refills | 0 days | Status: CP
Start: 2021-01-08 — End: ?

## 2021-01-12 DIAGNOSIS — J449 Chronic obstructive pulmonary disease, unspecified: Principal | ICD-10-CM

## 2021-01-16 ENCOUNTER — Ambulatory Visit: Admit: 2021-01-16 | Discharge: 2021-01-16 | Payer: MEDICARE

## 2021-01-16 DIAGNOSIS — D509 Iron deficiency anemia, unspecified: Principal | ICD-10-CM

## 2021-01-16 DIAGNOSIS — I5042 Chronic combined systolic (congestive) and diastolic (congestive) heart failure: Principal | ICD-10-CM

## 2021-01-16 DIAGNOSIS — J449 Chronic obstructive pulmonary disease, unspecified: Principal | ICD-10-CM

## 2021-01-16 DIAGNOSIS — N401 Enlarged prostate with lower urinary tract symptoms: Principal | ICD-10-CM

## 2021-01-16 DIAGNOSIS — M059 Rheumatoid arthritis with rheumatoid factor, unspecified: Principal | ICD-10-CM

## 2021-01-16 DIAGNOSIS — K2270 Barrett's esophagus without dysplasia: Principal | ICD-10-CM

## 2021-01-16 DIAGNOSIS — R3912 Poor urinary stream: Principal | ICD-10-CM

## 2021-01-16 DIAGNOSIS — S46011S Strain of muscle(s) and tendon(s) of the rotator cuff of right shoulder, sequela: Principal | ICD-10-CM

## 2021-01-16 DIAGNOSIS — I10 Essential (primary) hypertension: Principal | ICD-10-CM

## 2021-01-16 DIAGNOSIS — I2510 Atherosclerotic heart disease of native coronary artery without angina pectoris: Principal | ICD-10-CM

## 2021-01-16 MED ORDER — FERROUS SULFATE 325 MG (65 MG IRON) TABLET,DELAYED RELEASE
ORAL_TABLET | Freq: Every day | ORAL | 3 refills | 90 days | Status: CP
Start: 2021-01-16 — End: 2022-01-16

## 2021-01-18 ENCOUNTER — Ambulatory Visit: Admit: 2021-01-18 | Discharge: 2021-01-19 | Payer: MEDICARE

## 2021-01-18 DIAGNOSIS — S1980XA Other specified injuries of unspecified part of neck, initial encounter: Principal | ICD-10-CM

## 2021-01-18 MED ORDER — HYDROCODONE 5 MG-ACETAMINOPHEN 325 MG TABLET
ORAL_TABLET | Freq: Three times a day (TID) | ORAL | 0 refills | 4 days | Status: CP | PRN
Start: 2021-01-18 — End: ?

## 2021-02-05 DIAGNOSIS — M059 Rheumatoid arthritis with rheumatoid factor, unspecified: Principal | ICD-10-CM

## 2021-02-06 DIAGNOSIS — J410 Simple chronic bronchitis: Principal | ICD-10-CM

## 2021-02-06 MED ORDER — DOXYCYCLINE HYCLATE 100 MG TABLET
ORAL_TABLET | Freq: Two times a day (BID) | ORAL | 0 refills | 7 days | Status: CP
Start: 2021-02-06 — End: 2021-02-13

## 2021-02-07 DIAGNOSIS — D509 Iron deficiency anemia, unspecified: Principal | ICD-10-CM

## 2021-02-11 DIAGNOSIS — Z79899 Other long term (current) drug therapy: Principal | ICD-10-CM

## 2021-02-18 DIAGNOSIS — D509 Iron deficiency anemia, unspecified: Principal | ICD-10-CM

## 2021-02-20 DIAGNOSIS — M059 Rheumatoid arthritis with rheumatoid factor, unspecified: Principal | ICD-10-CM

## 2021-02-20 MED ORDER — SULFASALAZINE 500 MG TABLET
ORAL_TABLET | Freq: Three times a day (TID) | ORAL | 0 refills | 30 days | Status: CP
Start: 2021-02-20 — End: 2021-03-22

## 2021-03-25 DIAGNOSIS — D509 Iron deficiency anemia, unspecified: Principal | ICD-10-CM

## 2021-03-26 DIAGNOSIS — M059 Rheumatoid arthritis with rheumatoid factor, unspecified: Principal | ICD-10-CM

## 2021-04-10 ENCOUNTER — Ambulatory Visit: Admit: 2021-04-10 | Discharge: 2021-04-11 | Payer: MEDICARE

## 2021-04-10 DIAGNOSIS — N401 Enlarged prostate with lower urinary tract symptoms: Principal | ICD-10-CM

## 2021-04-10 DIAGNOSIS — I10 Essential (primary) hypertension: Principal | ICD-10-CM

## 2021-04-10 DIAGNOSIS — D509 Iron deficiency anemia, unspecified: Principal | ICD-10-CM

## 2021-04-10 DIAGNOSIS — I5042 Chronic combined systolic (congestive) and diastolic (congestive) heart failure: Principal | ICD-10-CM

## 2021-04-10 DIAGNOSIS — R3912 Poor urinary stream: Principal | ICD-10-CM

## 2021-04-10 DIAGNOSIS — I2510 Atherosclerotic heart disease of native coronary artery without angina pectoris: Principal | ICD-10-CM

## 2021-04-10 DIAGNOSIS — M059 Rheumatoid arthritis with rheumatoid factor, unspecified: Principal | ICD-10-CM

## 2021-04-10 DIAGNOSIS — J449 Chronic obstructive pulmonary disease, unspecified: Principal | ICD-10-CM

## 2021-04-10 DIAGNOSIS — S1980XA Other specified injuries of unspecified part of neck, initial encounter: Principal | ICD-10-CM

## 2021-04-10 MED ORDER — SULFASALAZINE 500 MG TABLET
ORAL_TABLET | 0 refills | 0 days | Status: CP
Start: 2021-04-10 — End: ?

## 2021-04-10 MED ORDER — METHOTREXATE SODIUM 25 MG/ML INJECTION SOLUTION
0 refills | 0.00000 days | Status: CP
Start: 2021-04-10 — End: ?

## 2021-04-12 DIAGNOSIS — M059 Rheumatoid arthritis with rheumatoid factor, unspecified: Principal | ICD-10-CM

## 2021-04-19 ENCOUNTER — Ambulatory Visit: Admit: 2021-04-19 | Payer: MEDICARE | Attending: Cardiovascular Disease | Primary: Cardiovascular Disease

## 2021-04-26 ENCOUNTER — Ambulatory Visit: Admit: 2021-04-26 | Discharge: 2021-04-27 | Payer: MEDICARE

## 2021-04-26 ENCOUNTER — Ambulatory Visit
Admit: 2021-04-26 | Discharge: 2021-04-27 | Payer: MEDICARE | Attending: Cardiovascular Disease | Primary: Cardiovascular Disease

## 2021-04-26 ENCOUNTER — Ambulatory Visit
Admit: 2021-04-26 | Discharge: 2021-04-27 | Payer: MEDICARE | Attending: Student in an Organized Health Care Education/Training Program | Primary: Student in an Organized Health Care Education/Training Program

## 2021-04-26 DIAGNOSIS — R0602 Shortness of breath: Principal | ICD-10-CM

## 2021-04-26 DIAGNOSIS — I509 Heart failure, unspecified: Principal | ICD-10-CM

## 2021-04-26 DIAGNOSIS — R079 Chest pain, unspecified: Principal | ICD-10-CM

## 2021-04-26 MED ORDER — AZITHROMYCIN 500 MG TABLET
ORAL_TABLET | 0 refills | 5 days | Status: CP
Start: 2021-04-26 — End: 2021-05-01

## 2021-04-26 MED ORDER — SPIRIVA WITH HANDIHALER 18 MCG AND INHALATION CAPSULES
ORAL_CAPSULE | Freq: Every day | RESPIRATORY_TRACT | 3 refills | 90 days | Status: CP
Start: 2021-04-26 — End: 2022-04-26

## 2021-04-29 DIAGNOSIS — R0602 Shortness of breath: Principal | ICD-10-CM

## 2021-04-29 DIAGNOSIS — R079 Chest pain, unspecified: Principal | ICD-10-CM

## 2021-04-29 MED ORDER — AZITHROMYCIN 250 MG TABLET
ORAL_TABLET | 0 refills | 5 days | Status: CP
Start: 2021-04-29 — End: 2021-05-04

## 2021-04-30 DIAGNOSIS — I5043 Acute on chronic combined systolic (congestive) and diastolic (congestive) heart failure: Principal | ICD-10-CM

## 2021-05-01 MED ORDER — FUROSEMIDE 20 MG TABLET
ORAL_TABLET | Freq: Every day | ORAL | 0 refills | 30.00000 days | Status: CP
Start: 2021-05-01 — End: 2021-05-31

## 2021-05-02 ENCOUNTER — Ambulatory Visit: Admit: 2021-05-02 | Payer: MEDICARE

## 2021-05-08 MED ORDER — PEG 3350-ELECTROLYTES 236 GRAM-22.74 GRAM-6.74 GRAM-5.86 GRAM SOLUTION
Freq: Once | ORAL | 0 refills | 1 days | Status: CP
Start: 2021-05-08 — End: 2021-05-08

## 2021-05-10 ENCOUNTER — Ambulatory Visit: Admit: 2021-05-10 | Discharge: 2021-05-11 | Payer: MEDICARE

## 2021-05-10 DIAGNOSIS — I2510 Atherosclerotic heart disease of native coronary artery without angina pectoris: Principal | ICD-10-CM

## 2021-05-10 DIAGNOSIS — I5042 Chronic combined systolic (congestive) and diastolic (congestive) heart failure: Principal | ICD-10-CM

## 2021-05-10 DIAGNOSIS — D509 Iron deficiency anemia, unspecified: Principal | ICD-10-CM

## 2021-05-10 DIAGNOSIS — K2270 Barrett's esophagus without dysplasia: Principal | ICD-10-CM

## 2021-05-10 DIAGNOSIS — J449 Chronic obstructive pulmonary disease, unspecified: Principal | ICD-10-CM

## 2021-05-10 MED ORDER — HYDROXYZINE HCL 25 MG TABLET
ORAL_TABLET | Freq: Two times a day (BID) | ORAL | 0 refills | 10.00000 days | Status: CP | PRN
Start: 2021-05-10 — End: ?

## 2021-05-10 MED ORDER — ALBUTEROL SULFATE HFA 90 MCG/ACTUATION AEROSOL INHALER
RESPIRATORY_TRACT | 3 refills | 0 days | Status: CP | PRN
Start: 2021-05-10 — End: ?

## 2021-05-10 MED FILL — PEG 3350-ELECTROLYTES 236 GRAM-22.74 GRAM-6.74 GRAM-5.86 GRAM SOLUTION: ORAL | 1 days supply | Qty: 4000 | Fill #0

## 2021-05-15 ENCOUNTER — Ambulatory Visit: Admit: 2021-05-15 | Discharge: 2021-05-16 | Payer: MEDICARE

## 2021-05-15 ENCOUNTER — Other Ambulatory Visit: Admit: 2021-05-15 | Discharge: 2021-05-16 | Payer: MEDICARE

## 2021-05-15 ENCOUNTER — Encounter: Admit: 2021-05-15 | Discharge: 2021-05-16 | Payer: MEDICARE | Attending: Adult Health | Primary: Adult Health

## 2021-05-15 DIAGNOSIS — D509 Iron deficiency anemia, unspecified: Principal | ICD-10-CM

## 2021-05-15 DIAGNOSIS — D508 Other iron deficiency anemias: Principal | ICD-10-CM

## 2021-05-18 DIAGNOSIS — M059 Rheumatoid arthritis with rheumatoid factor, unspecified: Principal | ICD-10-CM

## 2021-05-18 MED ORDER — SULFASALAZINE 500 MG TABLET
ORAL_TABLET | Freq: Three times a day (TID) | ORAL | 0 refills | 90 days | Status: CP
Start: 2021-05-18 — End: ?

## 2021-05-18 MED ORDER — METHOTREXATE SODIUM 25 MG/ML INJECTION SOLUTION
SUBCUTANEOUS | 1 refills | 84.00000 days | Status: CP
Start: 2021-05-18 — End: ?

## 2021-05-20 DIAGNOSIS — D509 Iron deficiency anemia, unspecified: Principal | ICD-10-CM

## 2021-05-24 ENCOUNTER — Ambulatory Visit: Admit: 2021-05-24 | Discharge: 2021-05-25 | Payer: MEDICARE

## 2021-05-30 ENCOUNTER — Ambulatory Visit: Admit: 2021-05-30 | Discharge: 2021-05-31 | Payer: MEDICARE

## 2021-05-30 DIAGNOSIS — I5043 Acute on chronic combined systolic (congestive) and diastolic (congestive) heart failure: Principal | ICD-10-CM

## 2021-05-30 DIAGNOSIS — D508 Other iron deficiency anemias: Principal | ICD-10-CM

## 2021-05-30 DIAGNOSIS — J449 Chronic obstructive pulmonary disease, unspecified: Principal | ICD-10-CM

## 2021-05-30 MED ORDER — FUROSEMIDE 40 MG TABLET
ORAL_TABLET | Freq: Every day | ORAL | 0 refills | 7 days | Status: CP
Start: 2021-05-30 — End: 2021-06-06

## 2021-05-30 MED ORDER — ANORO ELLIPTA 62.5 MCG-25 MCG/ACTUATION POWDER FOR INHALATION
Freq: Every day | RESPIRATORY_TRACT | 2 refills | 30 days | Status: CP
Start: 2021-05-30 — End: ?

## 2021-06-05 MED ORDER — FINASTERIDE 5 MG TABLET
ORAL_TABLET | Freq: Every day | ORAL | 3 refills | 90 days | Status: CP
Start: 2021-06-05 — End: ?

## 2021-06-05 MED ORDER — FOLIC ACID 1 MG TABLET
ORAL_TABLET | Freq: Every day | ORAL | 3 refills | 12.00000 days | Status: CP
Start: 2021-06-05 — End: ?

## 2021-06-11 ENCOUNTER — Ambulatory Visit: Admit: 2021-06-11 | Discharge: 2021-06-11 | Payer: MEDICARE

## 2021-06-11 ENCOUNTER — Encounter
Admit: 2021-06-11 | Discharge: 2021-06-11 | Payer: MEDICARE | Attending: Student in an Organized Health Care Education/Training Program | Primary: Student in an Organized Health Care Education/Training Program

## 2021-06-22 ENCOUNTER — Ambulatory Visit: Admit: 2021-06-22 | Discharge: 2021-06-22 | Payer: MEDICARE

## 2021-06-22 DIAGNOSIS — D508 Other iron deficiency anemias: Principal | ICD-10-CM

## 2021-06-22 DIAGNOSIS — K2270 Barrett's esophagus without dysplasia: Principal | ICD-10-CM

## 2021-06-22 DIAGNOSIS — M545 Acute low back pain, unspecified back pain laterality, unspecified whether sciatica present: Principal | ICD-10-CM

## 2021-06-24 DIAGNOSIS — D509 Iron deficiency anemia, unspecified: Principal | ICD-10-CM

## 2021-07-02 ENCOUNTER — Ambulatory Visit
Admit: 2021-07-02 | Discharge: 2021-07-02 | Payer: MEDICARE | Attending: Student in an Organized Health Care Education/Training Program | Primary: Student in an Organized Health Care Education/Training Program

## 2021-07-02 ENCOUNTER — Ambulatory Visit: Admit: 2021-07-02 | Discharge: 2021-07-02 | Payer: MEDICARE

## 2021-07-02 DIAGNOSIS — Z79631 Methotrexate, long term, current use: Principal | ICD-10-CM

## 2021-07-02 DIAGNOSIS — M059 Rheumatoid arthritis with rheumatoid factor, unspecified: Principal | ICD-10-CM

## 2021-07-02 MED ORDER — FOLIC ACID 1 MG TABLET
ORAL_TABLET | Freq: Every day | ORAL | 3 refills | 12.00000 days | Status: CP
Start: 2021-07-02 — End: ?

## 2021-07-02 MED ORDER — METHOTREXATE SODIUM 25 MG/ML INJECTION SOLUTION
SUBCUTANEOUS | 1 refills | 84 days | Status: CP
Start: 2021-07-02 — End: 2021-12-29

## 2021-07-02 MED ORDER — SULFASALAZINE 500 MG TABLET
ORAL_TABLET | Freq: Three times a day (TID) | ORAL | 1 refills | 90 days | Status: CP
Start: 2021-07-02 — End: 2021-12-29

## 2021-07-02 MED ORDER — DICLOFENAC 1 % TOPICAL GEL
Freq: Four times a day (QID) | TOPICAL | 1 refills | 57 days | Status: CP
Start: 2021-07-02 — End: 2022-07-02

## 2021-07-09 MED ORDER — MIRTAZAPINE 7.5 MG TABLET
ORAL_TABLET | Freq: Every evening | ORAL | 3 refills | 90.00000 days | Status: CP
Start: 2021-07-09 — End: 2022-07-09

## 2021-07-09 MED ORDER — ATORVASTATIN 40 MG TABLET
ORAL_TABLET | 3 refills | 0 days | Status: CP
Start: 2021-07-09 — End: ?

## 2021-07-26 ENCOUNTER — Institutional Professional Consult (permissible substitution): Admit: 2021-07-26 | Discharge: 2021-07-26 | Payer: MEDICARE | Attending: Audiologist | Primary: Audiologist

## 2021-07-26 ENCOUNTER — Ambulatory Visit
Admit: 2021-07-26 | Discharge: 2021-07-26 | Payer: MEDICARE | Attending: Cardiovascular Disease | Primary: Cardiovascular Disease

## 2021-07-26 DIAGNOSIS — I2510 Atherosclerotic heart disease of native coronary artery without angina pectoris: Principal | ICD-10-CM

## 2021-07-27 DIAGNOSIS — I5043 Acute on chronic combined systolic (congestive) and diastolic (congestive) heart failure: Principal | ICD-10-CM

## 2021-07-27 MED ORDER — FUROSEMIDE 20 MG TABLET
ORAL_TABLET | 0 refills | 0 days | Status: CP
Start: 2021-07-27 — End: ?

## 2021-08-02 ENCOUNTER — Ambulatory Visit: Admit: 2021-08-02 | Discharge: 2021-08-03 | Payer: MEDICARE

## 2021-08-03 ENCOUNTER — Ambulatory Visit: Admit: 2021-08-03 | Discharge: 2021-08-03 | Payer: MEDICARE

## 2021-08-09 ENCOUNTER — Ambulatory Visit: Admit: 2021-08-09 | Discharge: 2021-08-10 | Payer: MEDICARE

## 2021-08-20 MED ORDER — OMEPRAZOLE 20 MG CAPSULE,DELAYED RELEASE
ORAL_CAPSULE | Freq: Two times a day (BID) | ORAL | 3 refills | 90 days | Status: CP
Start: 2021-08-20 — End: 2022-08-20

## 2021-08-29 ENCOUNTER — Ambulatory Visit
Admit: 2021-08-29 | Discharge: 2021-08-30 | Payer: MEDICARE | Attending: Student in an Organized Health Care Education/Training Program | Primary: Student in an Organized Health Care Education/Training Program

## 2021-08-29 DIAGNOSIS — R42 Dizziness and giddiness: Principal | ICD-10-CM

## 2021-08-29 MED ORDER — MECLIZINE 12.5 MG TABLET
ORAL_TABLET | Freq: Three times a day (TID) | ORAL | 0 refills | 10 days | Status: CP | PRN
Start: 2021-08-29 — End: 2021-09-28

## 2021-08-29 MED ORDER — FLUTICASONE PROPIONATE 50 MCG/ACTUATION NASAL SPRAY,SUSPENSION
Freq: Two times a day (BID) | NASAL | 0 refills | 60 days | Status: CP
Start: 2021-08-29 — End: 2021-09-12

## 2021-08-29 MED ORDER — GUAIFENESIN ER 600 MG TABLET, EXTENDED RELEASE 12 HR
ORAL_TABLET | Freq: Two times a day (BID) | ORAL | 0 refills | 30 days | Status: CP
Start: 2021-08-29 — End: 2021-09-28

## 2021-09-18 ENCOUNTER — Ambulatory Visit: Admit: 2021-09-18 | Discharge: 2021-09-19 | Payer: MEDICARE | Attending: Gerontology | Primary: Gerontology

## 2021-09-18 DIAGNOSIS — R42 Dizziness and giddiness: Principal | ICD-10-CM

## 2021-09-18 DIAGNOSIS — K2270 Barrett's esophagus without dysplasia: Principal | ICD-10-CM

## 2021-09-18 DIAGNOSIS — R053 Chronic cough: Principal | ICD-10-CM

## 2021-09-18 MED ORDER — DOXYCYCLINE HYCLATE 100 MG CAPSULE
ORAL_CAPSULE | Freq: Two times a day (BID) | ORAL | 0 refills | 7 days | Status: CP
Start: 2021-09-18 — End: 2021-09-25

## 2021-09-24 DIAGNOSIS — R42 Dizziness and giddiness: Principal | ICD-10-CM

## 2021-09-27 ENCOUNTER — Ambulatory Visit
Admit: 2021-09-27 | Discharge: 2021-09-28 | Payer: MEDICARE | Attending: Cardiovascular Disease | Primary: Cardiovascular Disease

## 2021-09-27 DIAGNOSIS — I5030 Unspecified diastolic (congestive) heart failure: Principal | ICD-10-CM

## 2021-09-27 MED ORDER — EMPAGLIFLOZIN 10 MG TABLET
ORAL_TABLET | Freq: Every day | ORAL | 3 refills | 90 days | Status: CP
Start: 2021-09-27 — End: 2021-09-27

## 2021-09-27 MED ORDER — CARVEDILOL 12.5 MG TABLET
ORAL_TABLET | Freq: Two times a day (BID) | ORAL | 3 refills | 30 days | Status: CP
Start: 2021-09-27 — End: 2021-09-27

## 2021-09-27 MED ORDER — CARVEDILOL 6.25 MG TABLET
ORAL_TABLET | Freq: Two times a day (BID) | ORAL | 3 refills | 45 days | Status: CP
Start: 2021-09-27 — End: ?

## 2021-10-12 ENCOUNTER — Ambulatory Visit: Admit: 2021-10-12 | Discharge: 2021-10-13 | Payer: MEDICARE

## 2021-10-12 DIAGNOSIS — J449 Chronic obstructive pulmonary disease, unspecified: Principal | ICD-10-CM

## 2021-10-17 ENCOUNTER — Ambulatory Visit: Admit: 2021-10-17 | Payer: MEDICARE

## 2021-11-07 DIAGNOSIS — R053 Chronic cough: Principal | ICD-10-CM

## 2021-11-09 MED ORDER — HYDROXYZINE HCL 25 MG TABLET
ORAL_TABLET | 0 refills | 0 days | Status: CP
Start: 2021-11-09 — End: ?

## 2021-11-15 ENCOUNTER — Ambulatory Visit: Admit: 2021-11-15 | Discharge: 2021-11-15 | Payer: MEDICARE

## 2021-11-15 DIAGNOSIS — R634 Abnormal weight loss: Principal | ICD-10-CM

## 2021-11-15 DIAGNOSIS — I2510 Atherosclerotic heart disease of native coronary artery without angina pectoris: Principal | ICD-10-CM

## 2021-11-15 DIAGNOSIS — J449 Chronic obstructive pulmonary disease, unspecified: Principal | ICD-10-CM

## 2021-11-15 DIAGNOSIS — R42 Dizziness and giddiness: Principal | ICD-10-CM

## 2021-11-15 DIAGNOSIS — M059 Rheumatoid arthritis with rheumatoid factor, unspecified: Principal | ICD-10-CM

## 2021-11-15 DIAGNOSIS — I5042 Chronic combined systolic (congestive) and diastolic (congestive) heart failure: Principal | ICD-10-CM

## 2021-11-15 DIAGNOSIS — Z9181 History of falling: Principal | ICD-10-CM

## 2021-11-15 DIAGNOSIS — E785 Hyperlipidemia, unspecified: Principal | ICD-10-CM

## 2021-11-15 MED ORDER — OMEPRAZOLE 40 MG CAPSULE,DELAYED RELEASE
ORAL_CAPSULE | Freq: Every day | ORAL | 3 refills | 90 days | Status: CP
Start: 2021-11-15 — End: 2022-11-15

## 2021-11-27 MED ORDER — GABAPENTIN 100 MG CAPSULE
ORAL_CAPSULE | 0 refills | 0 days | Status: CP
Start: 2021-11-27 — End: ?

## 2021-12-19 DIAGNOSIS — I5043 Acute on chronic combined systolic (congestive) and diastolic (congestive) heart failure: Principal | ICD-10-CM

## 2021-12-19 MED ORDER — OXYBUTYNIN CHLORIDE ER 5 MG TABLET,EXTENDED RELEASE 24 HR
ORAL_TABLET | 3 refills | 0 days | Status: CP
Start: 2021-12-19 — End: 2022-12-19

## 2021-12-19 MED ORDER — FUROSEMIDE 20 MG TABLET
ORAL_TABLET | 0 refills | 0 days | Status: CP
Start: 2021-12-19 — End: ?

## 2021-12-19 MED ORDER — GABAPENTIN 100 MG CAPSULE
ORAL_CAPSULE | 3 refills | 0 days | Status: CP
Start: 2021-12-19 — End: 2022-12-19

## 2021-12-19 MED ORDER — HYDROXYZINE HCL 25 MG TABLET
ORAL_TABLET | 0 refills | 0 days | Status: CP
Start: 2021-12-19 — End: ?

## 2021-12-21 ENCOUNTER — Ambulatory Visit: Admit: 2021-12-21 | Discharge: 2021-12-22 | Payer: MEDICARE

## 2021-12-21 DIAGNOSIS — J449 Chronic obstructive pulmonary disease, unspecified: Principal | ICD-10-CM

## 2021-12-24 DIAGNOSIS — I5043 Acute on chronic combined systolic (congestive) and diastolic (congestive) heart failure: Principal | ICD-10-CM

## 2021-12-24 MED ORDER — FUROSEMIDE 20 MG TABLET
ORAL_TABLET | 0 refills | 0 days | Status: CP
Start: 2021-12-24 — End: ?

## 2021-12-25 DIAGNOSIS — M059 Rheumatoid arthritis with rheumatoid factor, unspecified: Principal | ICD-10-CM

## 2021-12-25 DIAGNOSIS — Z79631 Methotrexate, long term, current use: Principal | ICD-10-CM

## 2021-12-25 MED ORDER — HYDROXYZINE HCL 25 MG TABLET
ORAL_TABLET | Freq: Two times a day (BID) | ORAL | 0 refills | 10 days | Status: CP | PRN
Start: 2021-12-25 — End: 2022-02-23

## 2021-12-25 MED ORDER — OXYBUTYNIN CHLORIDE ER 5 MG TABLET,EXTENDED RELEASE 24 HR
ORAL_TABLET | Freq: Every day | ORAL | 3 refills | 90 days | Status: CP
Start: 2021-12-25 — End: 2022-12-25

## 2021-12-28 ENCOUNTER — Ambulatory Visit: Admit: 2021-12-28 | Discharge: 2021-12-29 | Payer: MEDICARE

## 2022-01-02 ENCOUNTER — Ambulatory Visit
Admit: 2022-01-02 | Payer: MEDICARE | Attending: Student in an Organized Health Care Education/Training Program | Primary: Student in an Organized Health Care Education/Training Program

## 2022-01-03 DIAGNOSIS — K869 Disease of pancreas, unspecified: Principal | ICD-10-CM

## 2022-01-03 DIAGNOSIS — R634 Abnormal weight loss: Principal | ICD-10-CM

## 2022-01-07 MED ORDER — TAMSULOSIN 0.4 MG CAPSULE
ORAL_CAPSULE | Freq: Every day | ORAL | 3 refills | 90 days | Status: CP
Start: 2022-01-07 — End: 2023-01-07

## 2022-01-13 DIAGNOSIS — Z79631 Methotrexate, long term, current use: Principal | ICD-10-CM

## 2022-01-13 DIAGNOSIS — M059 Rheumatoid arthritis with rheumatoid factor, unspecified: Principal | ICD-10-CM

## 2022-01-14 MED ORDER — FOLIC ACID 1 MG TABLET
ORAL_TABLET | Freq: Every day | ORAL | 3 refills | 90 days | Status: CP
Start: 2022-01-14 — End: 2023-01-14

## 2022-01-29 ENCOUNTER — Ambulatory Visit
Admit: 2022-01-29 | Discharge: 2022-01-30 | Payer: MEDICARE | Attending: Student in an Organized Health Care Education/Training Program | Primary: Student in an Organized Health Care Education/Training Program

## 2022-01-29 DIAGNOSIS — Z79631 Methotrexate, long term, current use: Principal | ICD-10-CM

## 2022-01-29 DIAGNOSIS — M059 Rheumatoid arthritis with rheumatoid factor, unspecified: Principal | ICD-10-CM

## 2022-01-29 MED ORDER — HYDROXYZINE HCL 25 MG TABLET
ORAL_TABLET | Freq: Two times a day (BID) | ORAL | 0 refills | 10 days | Status: CP | PRN
Start: 2022-01-29 — End: 2022-03-30

## 2022-01-29 MED ORDER — METHOTREXATE SODIUM (CONTAINS PRESERVATIVES) 25 MG/ML INJECTION SOLUTION
SUBCUTANEOUS | 1 refills | 84 days | Status: CP
Start: 2022-01-29 — End: 2022-07-28

## 2022-01-29 MED ORDER — SULFASALAZINE 500 MG TABLET,DELAYED RELEASE
ORAL_TABLET | Freq: Three times a day (TID) | ORAL | 3 refills | 90 days | Status: CP
Start: 2022-01-29 — End: 2023-01-29
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
## 2022-01-30 NOTE — Unmapped (Signed)
[facility] Specialty Medication Onboarding    Specialty Medication: methotrexate (CONTAINS PRESERVATIVES) 25 mg/mL injection solution  Prior Authorization: Not Required   Financial Assistance: No - copay  <$25  Final Copay/Day Supply: $0 / 84    Insurance Restrictions: None     Notes to Pharmacist: n/a    The triage team has completed the benefits investigation and has determined that the patient is able to fill this medication at [facility]. Please contact the patient to complete the onboarding or follow up with the prescribing physician as needed.

## 2022-01-30 NOTE — Unmapped (Unsigned)
The [facility] has made a third and final attempt to reach this patient to refill the following medication:methotrexate.      We have left voicemails on the following phone numbers: [phone] .    Dates contacted: 9/13, 9/18, 9/25  Last scheduled delivery: N/A--unable to onboard    The patient may be at risk of non-compliance with this medication. The patient should call the [facility] at [phone]  Option 4, then Option 2 (all other specialty patients) to refill medication.    Julianne Rice, PharmD   [facility] Specialty Pharmacist wipe and allow to air dry  Open syringe and needle and carefully remove the needle cap, pull the plunger back to the volume you'll need to inject  With the methotrexate vial on a flat surface, insert the needle straight through the middle of the rubber stopper until the needle is all the way down, push the plunger until all the air is injected into the vial  Hold the vial and syringe and turn them upside down while keeping the needle in the vial, pull down the plunger to withdraw the desired volume of liquid from the vial, carefully remove the needle from the vial  Pinch the skin - with your hand not holding the syringe pinch up a fold of skin at the injection site using your forefinger and thumb  Insert the needle into the fold of skin at about a 45 degree angle - it's best to use a quick dart-like motion - with the needle inserted, release the pinch of skin and allow the skin to relax  Push the plunger down slowly as far as it will go until the syringe is empty  Check that the syringe is empty and carefully pull the needle out at the same angle as inserted; after the needle is removed completely from the skin  Dispose of the used syringe immediately in your sharps disposal container  If you see any blood at the injection site, press a cotton ball or gauze on the site and maintain pressure until the bleeding stops, do not rub the injection site      Adherence/Missed dose instructions:  If your injection is given more than 2 days after your scheduled injection date - consult your pharmacist for additional instructions on how to adjust your dosing schedule.    Goals of Therapy     Polyarticular juvenile idiopathic arthritis  Assuring normal growth and development  Avoidance of long-term systemic glucocorticoid use  Protection of remaining articular structures  Maintenance of function  Maintenance of effective psychosocial functioning    Systemic juvenile idiopathic arthritis  Assuring normal growth and development  Avoidance of long-term systemic glucocorticoid use  Protection of remaining articular structures  Maintenance of function  Maintenance of effective psychosocial functioning    Rheumatoid arthritis  Achieve symptom remission  Slow disease progression  Protection of remaining articular structures  Maintenance of function  Maintenance of effective psychosocial functioning    Dermatomyositis and polymyositis  Improve muscle strength  Avoid development of extramuscular complications  Resolution of cutaneous disease manifestations  Maintenance of effective psychosocial functioning    Plaque Psoriasis  Minimize areas of skin involvement (% BSA)  Avoidance of long term glucocorticoid use  Maintenance of effective psychosocial functioning    Systemic lupus erythematosus  Ensure long-term survival  Achieve the lowest possible disease activity  Prevent organ damage  Improve quality of life  Minimize drug toxicity    Sarcoidosis  Prevent or lessen end organ damage  Reduction or clearing of radiographic abnormalities  Minimize long-term glucocorticoid use  Maintenance of effective psychosocial functioning      Side Effects & Monitoring Parameters     Upset stomach, diarrhea, nausea or throwing up  Feeling dizzy, tired, or weak  Hair thinning (reversible)  Minor cold-like symptoms  Photosensitivity - use sunscreen and avoid prolonged exposure    The following side effects should be reported to the provider:  Signs of a hypersensitivity reaction - rash; hives; itching; red, swollen, blistered, or peeling skin; wheezing; tightness in the chest or throat; difficulty breathing, swallowing, or talking; swelling of the mouth, face, lips, tongue, or throat; etc.  Reduced immune function - report signs of infection such as fever; chills; body aches; very bad sore throat; ear or sinus pain; cough; more sputum or change in color of sputum; pain with passing urine; wound that will not heal, etc.  Also at a slightly higher risk of some malignancies (mainly skin and blood cancers) due to this reduced immune function.  In the case of signs of infection - the patient should hold the next dose of methotrexate and call your primary care provider to ensure adequate medical care.  Treatment may be resumed when infection is treated and patient is asymptomatic.  Signs of bleeding - throwing up or coughing up blood; blood in the urine; black, red, or tarry stool; abnormal vaginal bleeding; bruises without a cause or that get bigger  Signs of pancreatitis - sudden very bad stomach pain, unexplained vomiting  Signs of kidney dysfunction - unable to pass urine; blood in the urine; sudden weight gain  Signs of liver dysfunction - darkened urine; upset stomach; light-colored stool; yellow skin or eyes  Signs of nerve problems - burning; numbness; tingling  Pinpoint red spots on the skin  Changes in eyesight      Contraindications, Warnings, & Precautions     Have your bloodwork checked as you have been told by your prescriber  Talk with your doctor if you are pregnant, planning to become pregnant, or breastfeeding - women taking this medication must use birth control while taking this drug and for some time after the last dose  Discuss the possible need for holding your dose(s) of methotrexate when a planned procedure is scheduled with the prescriber as it may delay healing/recovery timeline       Drug/Food Interactions     Medication list reviewed in Epic. The patient was instructed to inform the care team before taking any new medications or supplements. {Blank single:19197::No drug interactions identified,***}.     Storage, Handling Precautions, & Disposal     Store intact vials at room temperature  Protect from light  Dispose of used syringes in a sharps disposal container  50mg /16ml vials contain preservative and can be used as a multi-dose vial to be discarded 30 days after the first puncture if stored in refrigeration and protected from light (Methotrexate Vial How Supplied/Storage and Handling Building surveyor Information - [place] )          Current Medications (including OTC/herbals), Comorbidities and Allergies     Current Outpatient Medications   Medication Sig Dispense Refill    albuterol HFA 90 mcg/actuation inhaler Inhale 2 puffs every four (4) hours as needed for wheezing. 9 g 3    aspirin (ECOTRIN) 81 MG tablet Take 1 tablet (81 mg total) by mouth daily.      atorvastatin (LIPITOR) 40 MG tablet Take  1 tablet by mouth once daily 90 tablet 3    carvediloL (COREG) 6.25 MG tablet Take 1 tablet (6.25 mg total) by mouth Two (2) times a day. 90 tablet 3    diclofenac sodium (VOLTAREN) 1 % gel Apply 2 g topically four (4) times a day. 450 g 1    finasteride (PROSCAR) 5 mg tablet Take 1 tablet (5 mg total) by mouth daily. 90 tablet 3    fluticasone propionate (FLONASE) 50 mcg/actuation nasal spray 1 spray into each nostril two (2) times a day for 14 days. 16 g 0    folic acid (FOLVITE) 1 MG tablet Take 1 tablet (1,000 mcg total) by mouth daily. 90 tablet 3    furosemide (LASIX) 20 MG tablet Taking PRN 60 tablet 0    gabapentin (NEURONTIN) 100 MG capsule TAKE 1 CAPSULE BY MOUTH THREE TIMES DAILY 270 capsule 3    hydrOXYzine (ATARAX) 25 MG tablet Take 1 tablet (25 mg total) by mouth two (2) times a day as needed. 20 tablet 0    ibuprofen (ADVIL,MOTRIN) 600 MG tablet       losartan (COZAAR) 25 MG tablet Take 1 tablet (25 mg total) by mouth daily. 30 tablet 6    methotrexate sodium (METHOTREXATE, CONTAINS PRESERVATIVES,) 25 mg/mL injection solution Inject 1 mL (25 mg total) under the skin once a week. 12 mL 1    mirtazapine (REMERON) 7.5 MG tablet Take 1 tablet (7.5 mg total) by mouth nightly. 90 tablet 3    omeprazole (PRILOSEC) 40 MG capsule Take 1 capsule (40 mg total) by mouth daily. 90 capsule 3    oxybutynin (DITROPAN-XL) 5 MG 24 hr tablet Take 1 tablet (5 mg total) by mouth daily. 90 tablet 3    sulfaSALAzine (AZULFIDINE) 500 MG EC tablet Take 1 tablet (500 mg total) by mouth Three (3) times a day. 270 tablet 3    tamsulosin (FLOMAX) 0.4 mg capsule Take 1 capsule (0.4 mg total) by mouth daily. 90 capsule 3    umeclidinium-vilanteroL (ANORO ELLIPTA) 62.5-25 mcg/actuation inhaler Inhale 1 puff daily. 60 each 2     No current facility-administered medications for this visit.       Allergies   Allergen Reactions    Shellfish Containing Products Swelling    Antihistamines - Alkylamine Other (See Comments)     Can not urinate when he take antihistamine    Loratadine-Pseudoephedrine      Urinary retention; can't urinate    Codeine Itching       Patient Active Problem List   Diagnosis    Barrett's esophagus    Hypertension, benign    Diaphragmatic hernia    Sensorineural hearing loss    Cataract    Coronary artery disease involving native heart without angina pectoris    Hyperlipidemia    BPH (benign prostatic hyperplasia)    Pain of left upper extremity    GERD (gastroesophageal reflux disease)    Periodic limb movement disorder (PLMD)    Asthma    COPD (chronic obstructive pulmonary disease) (CMS-HCC)    Tear film insufficiency    History of stroke    Aortic regurgitation    Seropositive rheumatoid arthritis (CMS-HCC)    Weight loss    Vertigo    Transient alteration of awareness    Dysphagia    Risk for falls    Advance care planning/counseling discussion    Nocturnal leg cramps    Aortic valve disorders    Routine health maintenance  Cervico-occipital neuralgia    Incomplete tear of right rotator cuff    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    Appropriateness of Therapy     Acute infections noted within Epic:  No active infections  Patient reported infection: {Blank single:19197::None,***- patient reported to provider,***- pharmacy reported to provider}    Is medication and dose appropriate based on diagnosis and infection status? {Blank single:19197::Yes,No - evidence provided by prescriber in *** note}    Prescription has been clinically reviewed: {Blank single:19197::Yes,***}      Baseline Quality of Life Assessment      {DiseaseSpecificQOL:73897}    Financial Information     Medication Assistance provided: None Required    Anticipated copay of $0/84 days reviewed with patient. Verified delivery address.    Delivery Information     Scheduled delivery date: ***    Expected start date: ***    Medication will be delivered via {Blank:19197::UPS,Next Day Courier,Same Day Courier,Clinic Courier - *** clinic,***} to the {Blank:19197::prescription,temporary} address in Epic WAM.  This shipment {Blank single:19197::will,will not} require a signature.      Explained the services we provide at [facility] and that each month we would call to set up refills.  Stressed importance of returning phone calls so that we could ensure they receive their medications in time each month.  Informed patient that we should be setting up refills 7-10 days prior to when they will run out of medication.  A pharmacist will reach out to perform a clinical assessment periodically.  Informed patient that a welcome packet, containing information about our pharmacy and other support services, a Notice of Privacy Practices, and a drug information handout will be sent.      The patient or caregiver noted above participated in the development of this care plan and knows that they can request review of or adjustments to the care plan at any time.      Patient or caregiver verbalized understanding of the above information as well as how to contact the pharmacy at [phone] option 4 with any questions/concerns.  The pharmacy is open Monday through Friday 8:30am-4:30pm.  A pharmacist is available 24/7 via pager to answer any clinical questions they may have.    Patient Specific Needs Does the patient have any physical, cognitive, or cultural barriers? {Blank single:19197::No,Yes - ***}    Does the patient have adequate living arrangements? (i.e. the ability to store and take their medication appropriately) {Blank single:19197::Yes,No - ***}    Did you identify any home environmental safety or security hazards? {Blank single:19197::No,Yes - ***}    Patient prefers to have medications discussed with  {Blank single:19197::Patient,Family Member,Caregiver,Other}     Is the patient or caregiver able to read and understand education materials at a high school level or above? {Blank single:19197::No,Yes}    Patient's primary language is  {Blank single:19197::English,Spanish,***}     Is the patient high risk? {sschighriskpts:78327}    SOCIAL DETERMINANTS OF HEALTH     At the [facility], we have learned that life circumstances - like trouble affording food, housing, utilities, or transportation can affect the health of many of our patients.   That is why we wanted to ask: are you currently experiencing any life circumstances that are negatively impacting  your health and/or quality of life? {YES/NO/PATIENTDECLINED:93004}    Social Determinants of Health     Financial Resource Strain: Not on file   Internet Connectivity: Not on file   Food Insecurity: No Food Insecurity (07/26/2020)    Hunger Vital Sign     Worried About Running Out of Food in the Last Year: Never true     Ran Out of Food in the Last Year: Never true   Tobacco Use: Medium Risk (01/29/2022)    Patient History     Smoking Tobacco Use: Former     Smokeless Tobacco Use: Never     Passive Exposure: Not on file   Housing/Utilities: Low Risk  (04/10/2021)    Housing/Utilities     Within the past 12 months, have you ever stayed: outside, in a car, in a tent, in an overnight shelter, or temporarily in someone else's home (i.e. couch-surfing)?: No     Are you worried about losing your housing?: No     Within the past 12 months, have you been unable to get utilities (heat, electricity) when it was really needed?: No   Alcohol Use: Not At Risk (04/10/2021)    Alcohol Use     How often do you have a drink containing alcohol?: Never     How many drinks containing alcohol do you have on a typical day when you are drinking?: 1 - 2     How often do you have 5 or more drinks on one occasion?: Never   Transportation Needs: No Transportation Needs (09/18/2021)    PRAPARE - Transportation     Lack of Transportation (Medical): No     Lack of Transportation (Non-Medical): No   Substance Use: Low Risk  (04/10/2021)    Substance Use     Taken prescription drugs for non-medical reasons: Never     Taken illegal drugs: Never     Patient indicated they have taken drugs in the past year for non-medical reasons: Yes, [positive answer(s)]: Not on file   Health Literacy: Not on file   Physical Activity: Not on file   Interpersonal Safety: Not on file   Stress: Not on file   Intimate Partner Violence: Not on file   Depression: Not at risk (08/29/2021)    PHQ-2     PHQ-2 Score: 0   Social Connections: Not on file       Would you be willing to receive help with any of the needs that you have identified today? {Yes/No/Not applicable:93005}       Julianne Rice  [facility] Specialty Pharmacist

## 2022-02-01 DIAGNOSIS — I5043 Acute on chronic combined systolic (congestive) and diastolic (congestive) heart failure: Principal | ICD-10-CM

## 2022-02-01 MED ORDER — FUROSEMIDE 20 MG TABLET
ORAL_TABLET | 0 refills | 0 days | Status: CP
Start: 2022-02-01 — End: ?

## 2022-02-13 ENCOUNTER — Ambulatory Visit: Admit: 2022-02-13 | Discharge: 2022-02-14 | Payer: MEDICARE

## 2022-02-13 DIAGNOSIS — I5042 Chronic combined systolic (congestive) and diastolic (congestive) heart failure: Principal | ICD-10-CM

## 2022-02-13 DIAGNOSIS — B880 Other acariasis: Principal | ICD-10-CM

## 2022-02-13 DIAGNOSIS — R634 Abnormal weight loss: Principal | ICD-10-CM

## 2022-02-13 DIAGNOSIS — Z0000 Encounter for general adult medical examination without abnormal findings: Principal | ICD-10-CM

## 2022-02-13 DIAGNOSIS — I2510 Atherosclerotic heart disease of native coronary artery without angina pectoris: Principal | ICD-10-CM

## 2022-02-13 DIAGNOSIS — I10 Essential (primary) hypertension: Principal | ICD-10-CM

## 2022-02-13 DIAGNOSIS — J449 Chronic obstructive pulmonary disease, unspecified: Principal | ICD-10-CM

## 2022-02-13 MED ORDER — PREDNISONE 10 MG TABLET
ORAL_TABLET | Freq: Every day | ORAL | 0 refills | 7 days | Status: CP
Start: 2022-02-13 — End: ?

## 2022-02-13 MED ORDER — NITROGLYCERIN 0.4 MG SUBLINGUAL TABLET
ORAL_TABLET | SUBLINGUAL | 0 refills | 1 days | Status: CP | PRN
Start: 2022-02-13 — End: 2023-02-13

## 2022-02-13 MED ORDER — LOSARTAN 25 MG TABLET
ORAL_TABLET | Freq: Every day | ORAL | 3 refills | 90 days | Status: CP
Start: 2022-02-13 — End: 2023-02-13

## 2022-02-13 MED ORDER — FLUTICASONE PROPIONATE 50 MCG/ACTUATION NASAL SPRAY,SUSPENSION
Freq: Two times a day (BID) | NASAL | 3 refills | 60 days | Status: CP
Start: 2022-02-13 — End: 2022-02-27

## 2022-02-13 NOTE — Unmapped (Signed)
Normal exam today without evidence of fluid overload.

## 2022-02-13 NOTE — Unmapped (Signed)
BP Readings from Last 3 Encounters:   02/13/22 130/68   12/21/21 128/55   11/15/21 146/51   Blood pressure at goal with present therapy.  Continue current treatment plan.  Patient verbalized an understanding of the recommendations.

## 2022-02-13 NOTE — Unmapped (Signed)
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  Had some questions about his heart meds today, specifically the coreg.  Having lots of issues with sputum production.  Daily sputum production for months.  Was on nasal spray (fluticasone) but this made it worse.  Yellow phlegm, occasionally blood flecked.  Warm water gargling can help it.  He has been using Anoro for about 4 weeks.  Hasn't really noticed any difference.  Wt is stable.  Some water brash sx.  No chest pains.      COPD, GOLD B:  Minimal SOB today: Cont anoro for now.    ??  Heart Failure with Reduced Ejection Fraction and Diastolic Dysfunction:  Had questions about his meds.  Clarified that he should be taking his Coreg.    ??  Productive cough:  Unclear etiology.  ?? Bronchiectasis/ chronic bronchitis vs GERD given his hiatal hernia.  Will get CT chest to eval for bronchial disease.  Start trial of gaviscon for GERD.  They are to let me know in two weeks how the trial is going.      Today: Has his inhaler with him when he is out hunting.

## 2022-02-13 NOTE — Unmapped (Signed)
Assessment/ Plan: Ralph Santos is a 76 y.o. male presenting for follow-up of the problems below:    Problem List Items Addressed This Visit        Cardiovascular and Mediastinum    Hypertension, benign (Chronic)     BP Readings from Last 3 Encounters:   02/13/22 130/68   12/21/21 128/55   11/15/21 146/51   Blood pressure at goal with present therapy.  Continue current treatment plan.  Patient verbalized an understanding of the recommendations.          Relevant Medications    losartan (COZAAR) 25 MG tablet    Coronary artery disease involving native heart without angina pectoris - Primary     09/27/21 cardiology visit:  Non-obstructive CAD LHC 2023 (50-60% Lcx stenosis)  - Continue ASA 81mg , atorvastatin 40mg , coreg 6.25mg  bid    Today: Describes an angina episode while out hunting in the woods yesterday.  Resolved with rest in less than 5 minutes.  He remains very active.  He does not carry NTG, I will order.         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           Relevant Medications    losartan (COZAAR) 25 MG tablet    nitroglycerin (NITROSTAT) 0.4 MG SL tablet       Respiratory    COPD (chronic obstructive pulmonary disease) (CMS-HCC)     10/12/21 pulmonary visit, summary:  Has some decent exercise tolerance in that he is still able to hung and drag deer out of the woods.  Had some questions about his heart meds today, specifically the coreg.  Having lots of issues with sputum production.  Daily sputum production for months.  Was on nasal spray (fluticasone) but this made it worse.  Yellow phlegm, occasionally blood flecked.  Warm water gargling can help it.  He has been using Anoro for about 4 weeks.  Hasn't really noticed any difference.  Wt is stable.  Some water brash sx.  No chest pains.      COPD, GOLD B: Minimal SOB today: Cont anoro for now.    ??  Heart Failure with Reduced Ejection Fraction and Diastolic Dysfunction:  Had questions about his meds.  Clarified that he should be taking his Coreg.    ??  Productive cough:  Unclear etiology.  ?? Bronchiectasis/ chronic bronchitis vs GERD given his hiatal hernia.  Will get CT chest to eval for bronchial disease.  Start trial of gaviscon for GERD.  They are to let me know in two weeks how the trial is going.      Today: Has his inhaler with him when he is out hunting.  ??            Other    Weight loss     Wt Readings from Last 6 Encounters:   02/13/22 65.9 kg (145 lb 3.2 oz)   01/29/22 64.9 kg (143 lb)   12/21/21 64.4 kg (142 lb)   11/15/21 63.9 kg (140 lb 12.8 oz)   10/12/21 66.4 kg (146 lb 6.4 oz)   09/27/21 67.4 kg (148 lb 9.6 oz)     Weight is now better.  He had a questionable lesion on his pancreas on a CT chest in  June 2023:   Incidental imaging of the cephalad abdomen is remarkable for a slowly enlarging low-attenuation lesion associated the tail of pancreas (image 133 series 2). The lesion measures approximately 15 mm on this exam (10 mm September 2018). Though favored reflect benign change, consider CT imaging of the abdomen/pelvis for better characterization and further investigation of the patient's clinical presentation with unintended weight loss.      A CT abdomen is now scheduled for next week.             Chigger bites     [place]; now with extensive chigger bites in his lower extremities, these are resolving, but remain very pruritic.  Oral antihistamines have not helped, I ordered prednisone 10 mg x 7 days.  He will try to stop scratching.         Routine health maintenance     Influenza vaccine today; he will get his COVID vaccine at his next visit.            Health Maintenance   Topic Date Due   ??? Medicare Annual Wellness Visit (AWV)  10/12/2016   ??? DTaP/Tdap/Td Vaccines (2 - Td or Tdap) 01/31/2020   ??? COVID-19 Vaccine (7 - Moderna risk series) 06/05/2021   ??? Zoster Vaccines (2 of 2) 03/11/2022   ??? Potassium Monitoring  11/16/2022   ??? Serum Creatinine Monitoring  01/30/2023   ??? Pneumococcal Vaccine 65+  Completed   ??? Hepatitis C Screen  Completed   ??? Influenza Vaccine  Completed   ??? Colonoscopy  Discontinued     RETURN TO CARE:  Future Appointments   Date Time Provider Department Center   02/20/2022 11:15 AM [facility] CT RM 2 [facility] [place] [facility]   05/10/2022 10:45 AM Spangler, Orpah Cobb, MD [facility]   08/01/2022 10:40 AM Lanetta Inch, MD [facility]       Subjective:    HPI: Ralph Santos is a 76 y.o. male returns with his wife for follow-up.  The primary encounter diagnosis was Coronary artery disease involving native heart without angina pectoris, unspecified vessel or lesion type. 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 See above.      Social History     Social History Narrative    T - smoked 16-46, pack per day, 30 pack year history. Quit 1984    Work - worked as Geneticist, molecular, IT trainer, Retail banker buildings, Building surveyor.     Enjoys recreational activities outside.  He enjoys hunting and fishing               RX/ ALLERGIES/ MED HX/SURG HX/ SOC HX/FAM HX: Reviewed & updated in Epic.    ROS: The balance of 12 systems were reviewed and negative except as indicated in the HPI    Objective:    PE: BP 130/68 (BP Site: L Arm, BP Position: Sitting, BP Cuff Size: Medium)  - Pulse 56  - Wt 65.9 kg (145 lb 3.2 oz)  - BMI 25.72 kg/m??   Gen: well appearing, non-toxic, no acute distress.   Head: atraumatic  Eyes: sclera anicteric  Cardiovascular: RRR, no M/R/G. Pulses 2+ and equal b/l  Respiratory: CTAB, no wheezes, rales or rhonchi, equal BS b/l  Musculoskeletal: no clubbing, cyanosis,  or edema.   Skin: warm, well perfused; resolving trigger bites on his extremities.  Psychiatry: A&O x 3. Normal affect. Normal speech and thought content.      LABS/ STUDIES:   Results for orders placed or performed in visit on 01/29/22   Creatinine   Result Value Ref Range    Creatinine 1.08 0.60 - 1.10 mg/dL    eGFR CKD-EPI (0272) Male 11 >=60 mL/min/1.29m2   Hepatic Function Panel   Result Value Ref Range    Albumin 3.8 3.4 - 5.0 g/dL    Total Protein 7.0 5.7 - 8.2 g/dL    Total Bilirubin 0.4 0.3 - 1.2 mg/dL    Bilirubin, Direct 5.36 0.00 - 0.30 mg/dL    AST 20 <=64 U/L    ALT 10 10 - 49 U/L    Alkaline Phosphatase 32 (L) 46 - 116 U/L   CBC w/ Differential   Result Value Ref Range    WBC 8.8 3.6 - 11.2 10*9/L    RBC 4.09 (L) 4.26 - 5.60 10*12/L    HGB 13.0 12.9 - 16.5 g/dL    HCT 40.3 47.4 - 25.9 %    MCV 97.1 (H) 77.6 - 95.7 fL    MCH 31.9 25.9 - 32.4 pg    MCHC 32.8 32.0 - 36.0 g/dL    RDW 56.3 87.5 - 64.3 %    MPV 7.5 6.8 - 10.7 fL    Platelet 227 150 - 450 10*9/L    nRBC 0 <=4 /100 WBCs    Neutrophils % 59.7 %    Lymphocytes % 26.1 %    Monocytes % 11.3 %    Eosinophils % 2.0 %    Basophils % 0.9 %    Absolute Neutrophils 5.3 1.8 - 7.8 10*9/L    Absolute Lymphocytes 2.3 1.1 - 3.6 10*9/L    Absolute Monocytes 1.0 (H) 0.3 - 0.8 10*9/L    Absolute Eosinophils 0.2 0.0 - 0.5 10*9/L    Absolute Basophils 0.1 0.0 - 0.1 10*9/L     *Note: Due to a large number of results and/or encounters for the requested time period, some results have not been displayed. A complete set of results can be found in Results Review.       I personally spent 35 minutes face-to-face and non-face-to-face in the care of this patient, which includes all pre, intra, and post visit time on the date of service.

## 2022-02-13 NOTE — Unmapped (Addendum)
09/27/21 cardiology visit:  Non-obstructive CAD LHC 2023 (50-60% Lcx stenosis)  - Continue ASA 81mg , atorvastatin 40mg , coreg 6.25mg  bid    Today: Describes an angina episode while out hunting in the woods yesterday.  Resolved with rest in less than 5 minutes.  He remains very active.  He does not carry NTG, I will order.

## 2022-02-13 NOTE — Unmapped (Addendum)
Wt Readings from Last 6 Encounters:   02/13/22 65.9 kg (145 lb 3.2 oz)   01/29/22 64.9 kg (143 lb)   12/21/21 64.4 kg (142 lb)   11/15/21 63.9 kg (140 lb 12.8 oz)   10/12/21 66.4 kg (146 lb 6.4 oz)   09/27/21 67.4 kg (148 lb 9.6 oz)     Weight is now better.  He had a questionable lesion on his pancreas on a CT chest in June 2023:   Incidental imaging of the cephalad abdomen is remarkable for a slowly enlarging low-attenuation lesion associated the tail of pancreas (image 133 series 2). The lesion measures approximately 15 mm on this exam (10 mm September 2018). Though favored reflect benign change, consider CT imaging of the abdomen/pelvis for better characterization and further investigation of the patient's clinical presentation with unintended weight loss.      A CT abdomen is now scheduled for next week.

## 2022-02-13 NOTE — Unmapped (Signed)
[place] church [place]; now with extensive chigger bites in his lower extremities, these are resolving, but remain very pruritic.  Oral antihistamines have not helped, I ordered prednisone 10 mg x 7 days.  He will try to stop scratching.

## 2022-02-13 NOTE — Unmapped (Signed)
Influenza vaccine today; he will get his COVID vaccine at his next visit.

## 2022-02-22 NOTE — Unmapped (Signed)
[facility] - PHARMACIST NOTES    [facility] sent message to [facility] that they have been unable to reach patient about refill of Methotrexate. Left VM for patient and provided [facility] contact information.    Waverly Ferrari  PharmD Candidate [address] [employer] Intern

## 2022-02-26 NOTE — Unmapped (Signed)
[facility] - Enhanced Care Program  Reason for Call: Medication, Pharmacy and Medication Access; Type: [facility] Refill Call  Referral Request: Rheumatology - Sheliah Mends, CPP    Summary of Telephone Encounter  ??? Referral was received to assist pt with coordination of refill of subcutaneous methotrexate 25mg /mL weekly injections. [facility] was not able to reach pt to coordinate refill.  ??? I called pt at phone # [phone] but was not able to reach pt. I did leave a VM and provided the phone # to [facility]. I also sent pt a MY CHART message.     Follow-Up:  ??? Continue to reach pt to coordinate refill of methotrexate.     Call Attempt #: 1  Time Spent on Referral: 10 minutes  Number of Days Spent on Referral: 1     Aldean Ast, [facility]   Pharmacy Department  [facility]  [address]   [place], [place] [address]  [phone]

## 2022-02-27 NOTE — Unmapped (Signed)
[facility]   Patient Onboarding/Medication Counseling    [name] is a 76 y.o. male with RA who I am counseling today on continuation of therapy.  I am speaking to the patient.    Was a Nurse, learning disability used for this call? No    Verified patient's date of birth / HIPAA.    Specialty medication(s) to be sent: Inflammatory Disorders: methotrexate (injectable)      Non-specialty medications/supplies to be sent: None at this time (Pt reports he has plenty of syringes)      Medications not needed at this time: n/a         Methotrexate injection    The patient declined counseling on medication administration, missed dose instructions, goals of therapy, side effects and monitoring parameters, warnings and precautions, drug/food interactions, and storage, handling precautions, and disposal because they have taken the medication previously. The information in the declined sections below are for informational purposes only and was not discussed with patient.       Medication & Administration     Dosage: Inject 1mL (25mg ) subcutaneously every 7 days      Lab tests required prior to treatment initiation:  Pregnancy: Patient is male: screening not applicable.      Administration:     Vials and Animal nutritionist all supplies needed for injection on a clean, flat working surface: medication vial, syringe with needle, alcohol swabs, sharps container, etc.  Look at the medication label - look for correct medication, correct dose, and check the expiration date  Look at the medication - the liquid in the syringe should appear clear and yellow in color  Select injection site - you can use the front of your thigh or your belly (but not the area 2 inches around your belly button)  Prepare injection site - wash your hands and clean the skin at the injection site with an alcohol swab and let it air dry, do not touch the injection site again before the injection  Pop the protective cap off the top of the vial if it's the first use for that vial and wipe the rubber stopper with an alcohol wipe and allow to air dry  Open syringe and needle and carefully remove the needle cap, pull the plunger back to the volume you'll need to inject  With the methotrexate vial on a flat surface, insert the needle straight through the middle of the rubber stopper until the needle is all the way down, push the plunger until all the air is injected into the vial  Hold the vial and syringe and turn them upside down while keeping the needle in the vial, pull down the plunger to withdraw the desired volume of liquid from the vial, carefully remove the needle from the vial  Pinch the skin - with your hand not holding the syringe pinch up a fold of skin at the injection site using your forefinger and thumb  Insert the needle into the fold of skin at about a 45 degree angle - it's best to use a quick dart-like motion - with the needle inserted, release the pinch of skin and allow the skin to relax  Push the plunger down slowly as far as it will go until the syringe is empty  Check that the syringe is empty and carefully pull the needle out at the same angle as inserted; after the needle is removed completely from the skin  Dispose of the used syringe immediately in your sharps  disposal container  If you see any blood at the injection site, press a cotton ball or gauze on the site and maintain pressure until the bleeding stops, do not rub the injection site      Adherence/Missed dose instructions:  If your injection is given more than 2 days after your scheduled injection date - consult your pharmacist for additional instructions on how to adjust your dosing schedule.    Goals of Therapy       Rheumatoid arthritis  Achieve symptom remission  Slow disease progression  Protection of remaining articular structures  Maintenance of function  Maintenance of effective psychosocial functioning        Side Effects & Monitoring Parameters     Upset stomach, diarrhea, nausea or throwing up  Feeling dizzy, tired, or weak  Hair thinning (reversible)  Minor cold-like symptoms  Photosensitivity - use sunscreen and avoid prolonged exposure    The following side effects should be reported to the provider:  Signs of a hypersensitivity reaction - rash; hives; itching; red, swollen, blistered, or peeling skin; wheezing; tightness in the chest or throat; difficulty breathing, swallowing, or talking; swelling of the mouth, face, lips, tongue, or throat; etc.  Reduced immune function - report signs of infection such as fever; chills; body aches; very bad sore throat; ear or sinus pain; cough; more sputum or change in color of sputum; pain with passing urine; wound that will not heal, etc.  Also at a slightly higher risk of some malignancies (mainly skin and blood cancers) due to this reduced immune function.  In the case of signs of infection - the patient should hold the next dose of methotrexate and call your primary care provider to ensure adequate medical care.  Treatment may be resumed when infection is treated and patient is asymptomatic.  Signs of bleeding - throwing up or coughing up blood; blood in the urine; black, red, or tarry stool; abnormal vaginal bleeding; bruises without a cause or that get bigger  Signs of pancreatitis - sudden very bad stomach pain, unexplained vomiting  Signs of kidney dysfunction - unable to pass urine; blood in the urine; sudden weight gain  Signs of liver dysfunction - darkened urine; upset stomach; light-colored stool; yellow skin or eyes  Signs of nerve problems - burning; numbness; tingling  Pinpoint red spots on the skin  Changes in eyesight      Contraindications, Warnings, & Precautions     Have your bloodwork checked as you have been told by your prescriber  Talk with your doctor if you are pregnant, planning to become pregnant, or breastfeeding - women taking this medication must use birth control while taking this drug and for some time after the last dose  Discuss the possible need for holding your dose(s) of methotrexate when a planned procedure is scheduled with the prescriber as it may delay healing/recovery timeline       Drug/Food Interactions     Medication list reviewed in Epic. The patient was instructed to inform the care team before taking any new medications or supplements. No drug interactions identified.     Storage, Handling Precautions, & Disposal     Store intact vials at room temperature  Protect from light  Dispose of used syringes in a sharps disposal container  50mg /36ml vials contain preservative and can be used as a multi-dose vial to be discarded 30 days after the first puncture if stored in refrigeration and protected from light  (Methotrexate Vial How Supplied/Storage and  Quarry manager Information - [place] )          Current Medications (including OTC/herbals), Comorbidities and Allergies     Current Outpatient Medications   Medication Sig Dispense Refill    albuterol HFA 90 mcg/actuation inhaler Inhale 2 puffs every four (4) hours as needed for wheezing. 9 g 3    aspirin (ECOTRIN) 81 MG tablet Take 1 tablet (81 mg total) by mouth daily.      atorvastatin (LIPITOR) 40 MG tablet Take 1 tablet by mouth once daily 90 tablet 3    carvediloL (COREG) 6.25 MG tablet Take 1 tablet (6.25 mg total) by mouth Two (2) times a day. 90 tablet 3    diclofenac sodium (VOLTAREN) 1 % gel Apply 2 g topically four (4) times a day. 450 g 1    finasteride (PROSCAR) 5 mg tablet Take 1 tablet (5 mg total) by mouth daily. 90 tablet 3    fluticasone propionate (FLONASE) 50 mcg/actuation nasal spray 1 spray into each nostril two (2) times a day for 14 days. 16 g 3    folic acid (FOLVITE) 1 MG tablet Take 1 tablet (1,000 mcg total) by mouth daily. 90 tablet 3    furosemide (LASIX) 20 MG tablet TAKE AS NEEDED 60 tablet 0    gabapentin (NEURONTIN) 100 MG capsule TAKE 1 CAPSULE BY MOUTH THREE TIMES DAILY 270 capsule 3    hydrOXYzine (ATARAX) 25 MG tablet Take 1 tablet (25 mg total) by mouth two (2) times a day as needed. 20 tablet 0    ibuprofen (ADVIL,MOTRIN) 600 MG tablet       losartan (COZAAR) 25 MG tablet Take 1 tablet (25 mg total) by mouth daily. 90 tablet 3    methotrexate sodium (METHOTREXATE, CONTAINS PRESERVATIVES,) 25 mg/mL injection solution Inject 1 mL (25 mg total) under the skin once a week. 12 mL 1    mirtazapine (REMERON) 7.5 MG tablet Take 1 tablet (7.5 mg total) by mouth nightly. 90 tablet 3    nitroglycerin (NITROSTAT) 0.4 MG SL tablet Place 1 tablet (0.4 mg total) under the tongue every five (5) minutes as needed for chest pain. Maximum of 3 doses in 15 minutes. 25 tablet 0    omeprazole (PRILOSEC) 40 MG capsule Take 1 capsule (40 mg total) by mouth daily. 90 capsule 3    oxybutynin (DITROPAN-XL) 5 MG 24 hr tablet Take 1 tablet (5 mg total) by mouth daily. 90 tablet 3    predniSONE (DELTASONE) 10 MG tablet Take 1 tablet (10 mg total) by mouth daily. For insect bites 7 tablet 0    sulfaSALAzine (AZULFIDINE) 500 MG EC tablet Take 1 tablet (500 mg total) by mouth Three (3) times a day. 270 tablet 3    tamsulosin (FLOMAX) 0.4 mg capsule Take 1 capsule (0.4 mg total) by mouth daily. 90 capsule 3    umeclidinium-vilanteroL (ANORO ELLIPTA) 62.5-25 mcg/actuation inhaler Inhale 1 puff daily. 60 each 2     No current facility-administered medications for this visit.       Allergies   Allergen Reactions    Shellfish Containing Products Swelling    Antihistamines - Alkylamine Other (See Comments)     Can not urinate when he take antihistamine    Loratadine-Pseudoephedrine      Urinary retention; can't urinate    Codeine Itching       Patient Active Problem List   Diagnosis    Barrett's esophagus    Hypertension, benign  Diaphragmatic hernia    Sensorineural hearing loss    Cataract    Coronary artery disease involving native heart without angina pectoris    Hyperlipidemia    BPH (benign prostatic hyperplasia)    GERD (gastroesophageal reflux disease)    Periodic limb movement disorder (PLMD)    Asthma    COPD (chronic obstructive pulmonary disease) (CMS-HCC)    Tear film insufficiency    History of stroke    Aortic regurgitation    Seropositive rheumatoid arthritis (CMS-HCC)    Weight loss    Vertigo    Chigger bites    Transient alteration of awareness    Dysphagia    Risk for falls    Advance care planning/counseling discussion    Nocturnal leg cramps    Aortic valve disorders    Routine health maintenance    Cervico-occipital neuralgia    Incomplete tear of right rotator cuff    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    Appropriateness of Therapy     Acute infections noted within Epic:  No active infections  Patient reported infection: None    Is medication and dose appropriate based on diagnosis and infection status? Yes    Prescription has been clinically reviewed: Yes      Baseline Quality of Life Assessment      How many days over the past month did your RA  keep you from your normal activities? For example, brushing your teeth or getting up in the morning. Patient declined to answer    Financial Information     Medication Assistance provided: None Required    Anticipated copay of $0 (84 days) reviewed with patient. Verified delivery address.    Delivery Information     Scheduled delivery date: 03/05/22    Expected start date: 03/11/22    Medication will be delivered via UPS to the prescription address in [facility].  This shipment will not require a signature.      Explained the services we provide at [facility] and that each month we would call to set up refills.  Stressed importance of returning phone calls so that we could ensure they receive their medications in time each month.  Informed patient that we should be setting up refills 7-10 days prior to when they will run out of medication.  A pharmacist will reach out to perform a clinical assessment periodically.  Informed patient that a welcome packet, containing information about our pharmacy and other support services, a Notice of Privacy Practices, and a drug information handout will be sent.      The patient or caregiver noted above participated in the development of this care plan and knows that they can request review of or adjustments to the care plan at any time.      Patient or caregiver verbalized understanding of the above information as well as how to contact the pharmacy at [phone] option 4 with any questions/concerns.  The pharmacy is open Monday through Friday 8:30am-4:30pm.  A pharmacist is available 24/7 via pager to answer any clinical questions they may have.    Patient Specific Needs     Does the patient have any physical, cognitive, or cultural barriers? No    Does the patient have adequate living arrangements? (i.e. the ability to store and  take their medication appropriately) Yes    Did you identify any home environmental safety or security hazards? No    Patient prefers to have medications discussed with  Patient     Is the patient or caregiver able to read and understand education materials at a high school level or above? Yes    Patient's primary language is  English     Is the patient high risk? No    SOCIAL DETERMINANTS OF HEALTH     At the [facility], we have learned that life circumstances - like trouble affording food, housing, utilities, or transportation can affect the health of many of our patients.   That is why we wanted to ask: are you currently experiencing any life circumstances that are negatively impacting your health and/or quality of life? Patient declined to answer    Social Determinants of Health     Financial Resource Strain: Not on file   Internet Connectivity: Not on file   Food Insecurity: No Food Insecurity (07/26/2020)    Hunger Vital Sign     Worried About Running Out of Food in the Last Year: Never true     Ran Out of Food in the Last Year: Never true   Tobacco Use: Medium Risk (02/13/2022)    Patient History     Smoking Tobacco Use: Former     Smokeless Tobacco Use: Never     Passive Exposure: Not on file   Housing/Utilities: Low Risk  (04/10/2021)    Housing/Utilities     Within the past 12 months, have you ever stayed: outside, in a car, in a tent, in an overnight shelter, or temporarily in someone else's home (i.e. couch-surfing)?: No     Are you worried about losing your housing?: No     Within the past 12 months, have you been unable to get utilities (heat, electricity) when it was really needed?: No   Alcohol Use: Not At Risk (04/10/2021)    Alcohol Use     How often do you have a drink containing alcohol?: Never     How many drinks containing alcohol do you have on a typical day when you are drinking?: 1 - 2     How often do you have 5 or more drinks on one occasion?: Never   Transportation Needs: No Transportation Needs (02/13/2022)    PRAPARE - Transportation     Lack of Transportation (Medical): No     Lack of Transportation (Non-Medical): No   Substance Use: Low Risk  (04/10/2021)    Substance Use     Taken prescription drugs for non-medical reasons: Never     Taken illegal drugs: Never     Patient indicated they have taken drugs in the past year for non-medical reasons: Yes, [positive answer(s)]: Not on file   Health Literacy: Not on file   Physical Activity: Not on file   Interpersonal Safety: Not on file   Stress: Not on file   Intimate Partner Violence: Not on file   Depression: Not at risk (08/29/2021)    PHQ-2     PHQ-2 Score: 0   Social Connections: Not on file       Would you be willing to receive help with any of the needs that you have identified today? Not applicable       Camillo Flaming, PharmD  [facility] Specialty Pharmacist

## 2022-02-27 NOTE — Unmapped (Signed)
[facility] - Enhanced Care Program  Reason for Call: Medication, Pharmacy and Medication Access; Type: [facility] Refill Call  Referral Request: Rheumatology - Sheliah Mends, CPP    Summary of Telephone Encounter  ??? Referral was received to assist pt with coordination of refill of subcutaneous methotrexate 25mg /mL weekly injections. [facility] was not able to reach pt to coordinate refill.  ?? I called pt at phone # [phone] and spoke with pt and his wife. Pt has been taking his methotrexate 25 mg/mL weekly on Monday or Tuesday. He sets his medication out by his pill box to help him remember to take it. Pt denies any side effects. Pt has not stopped or started any new medications. No new allergies to report. I also confirmed pt's address on Epic.   ?? I provided a conference call to [facility] and connected pt with Joni Reining PharmD.     Follow-Up:  ??? Notify Sheliah Mends, CPP that pt will receive delivery on 03/05/22.   ??? Referral is complete and will be closed.     Call Attempt #: 2  Time Spent on Referral: 20 minutes  Number of Days Spent on Referral: 2     Vertell Limber RN, BSN  Nursing [facility]   Pharmacy Department  [facility]  [address]   [place], [place] [address]  [phone]

## 2022-03-04 MED FILL — METHOTREXATE SODIUM (CONTAINS PRESERVATIVES) 25 MG/ML INJECTION SOLUTION: SUBCUTANEOUS | 84 days supply | Qty: 12 | Fill #0

## 2022-04-04 ENCOUNTER — Ambulatory Visit: Admit: 2022-04-04 | Discharge: 2022-04-05 | Payer: MEDICARE

## 2022-04-04 DIAGNOSIS — R059 Cough, unspecified type: Principal | ICD-10-CM

## 2022-04-04 DIAGNOSIS — I5042 Chronic combined systolic (congestive) and diastolic (congestive) heart failure: Principal | ICD-10-CM

## 2022-04-04 DIAGNOSIS — J449 Chronic obstructive pulmonary disease, unspecified: Principal | ICD-10-CM

## 2022-04-04 MED ORDER — ALBUTEROL SULFATE HFA 90 MCG/ACTUATION AEROSOL INHALER
RESPIRATORY_TRACT | 3 refills | 0 days | Status: CP | PRN
Start: 2022-04-04 — End: ?

## 2022-04-04 MED ORDER — ANORO ELLIPTA 62.5 MCG-25 MCG/ACTUATION POWDER FOR INHALATION
Freq: Every day | RESPIRATORY_TRACT | 2 refills | 30 days | Status: CP
Start: 2022-04-04 — End: ?
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
## 2022-04-04 NOTE — Unmapped (Signed)
04/04/22 1540   Transportation Needs   In the past 12 months, has lack of transportation kept you from medical appointments or from getting medications? no   In the past 12 months, has lack of transportation kept you from meetings, work, or from getting things needed for daily living? No

## 2022-04-04 NOTE — Unmapped (Signed)
Received a red flag message from [facility]     Called patient who stated I just don't feel well. I got the shakes   Patient stated last two weeks patient feels like he is still asleep.  Patient feels like someone stuck a wire to me.    Patient denies pain   Patient kept stating I don't know what's   Patient stated he has slimy mucus.  Patient is also sob at times   Patient denies cough /fever  Extremely difficult to triage   Scheduled in clinic to assess    Lequita Halt is this ok?

## 2022-04-05 MED ORDER — PREDNISONE 20 MG TABLET
ORAL_TABLET | Freq: Every day | ORAL | 0 refills | 5 days | Status: CP
Start: 2022-04-05 — End: 2022-04-10

## 2022-04-05 MED ORDER — DICLOFENAC 1 % TOPICAL GEL
Freq: Four times a day (QID) | TOPICAL | 0 refills | 13 days | Status: CP
Start: 2022-04-05 — End: 2023-04-05

## 2022-04-05 NOTE — Unmapped (Signed)
It was a pleasure to work with you today.     I have ordered some blood work today. The labs include: BMP, BNP,      Chest x ray today    Please return to see me in 2 weeks     Please return to clinic to see your primary care provider as scheduled.    Thayer Jew, DNP, RN, AGPCNP-C, [facility]  Phone: [phone]  Fax:      [phone]  ______________________________________________________________________    If you have any questions, please call the [employer] at [phone] during regular business hours, or send me a message through MyChart.     Please allow 3 business days for responses to [employer]. Test results will be released immediately in MyChart. Please allow 3 business days for [place] to review and interpret them.    For urgent concerns after hours and weekends, please call the [facility] nurse. Health link (for after hours and weekends): [phone]

## 2022-04-05 NOTE — Unmapped (Signed)
Pharmacy Request for Medication Clarification    Name of medication: Spouse doesn't know the names of medications  What needs to be clarified?: Pt discussed two medications  with PCP, that were not ordered yesterday. One is an ointment for arthritis, and a medicine for his mouth. Please advise.  Return call to:   Name: [employer]:   Phone number: [phone]   PCP: [name], MD  Last encounter in department: 04/04/2022

## 2022-04-10 DIAGNOSIS — I5043 Acute on chronic combined systolic (congestive) and diastolic (congestive) heart failure: Principal | ICD-10-CM

## 2022-04-10 MED ORDER — FUROSEMIDE 20 MG TABLET
ORAL_TABLET | 0 refills | 0 days | Status: CP
Start: 2022-04-10 — End: ?

## 2022-04-10 MED ORDER — AMOXICILLIN 500 MG-POTASSIUM CLAVULANATE 125 MG TABLET
ORAL_TABLET | Freq: Three times a day (TID) | ORAL | 0 refills | 5 days | Status: CP
Start: 2022-04-10 — End: ?

## 2022-04-11 NOTE — Unmapped (Signed)
Called to check in on patient. He is doing better with the prednisone. Breathing easier. He reports still a large volume of mucous he is coughing up. Unfortunately he never got the labs we ordered several days prior when at the lab, but at this point do not think this is CHF, more likely COPD exacerbation.    --At this point will treat with antibiotics as well. Sent to his pharmacy.    Discussed getting over the counter Mucinex (not DM) and initiation of antibiotics.    Augmentin ordered 500-125mg  TID for 5 days. Pt and wife aware and in agreement with plan. He will finish all steroid course as well    Discussed red flag sx    [name], DNP, RN, AGPCNP-C, CWOCN  Division of Geriatric Medicine

## 2022-04-15 DIAGNOSIS — I5043 Acute on chronic combined systolic (congestive) and diastolic (congestive) heart failure: Principal | ICD-10-CM

## 2022-04-15 DIAGNOSIS — I5030 Unspecified diastolic (congestive) heart failure: Principal | ICD-10-CM

## 2022-04-15 MED ORDER — FUROSEMIDE 20 MG TABLET
ORAL_TABLET | 0 refills | 0 days
Start: 2022-04-15 — End: ?

## 2022-04-15 MED ORDER — CARVEDILOL 6.25 MG TABLET
ORAL_TABLET | Freq: Two times a day (BID) | ORAL | 3 refills | 45 days | Status: CP
Start: 2022-04-15 — End: ?

## 2022-04-18 ENCOUNTER — Ambulatory Visit: Admit: 2022-04-18 | Payer: MEDICARE
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
## 2022-04-22 ENCOUNTER — Emergency Department (HOSPITAL_COMMUNITY): Payer: Medicare Other

## 2022-04-22 ENCOUNTER — Other Ambulatory Visit: Payer: Self-pay

## 2022-04-22 ENCOUNTER — Emergency Department (HOSPITAL_COMMUNITY)
Admission: EM | Admit: 2022-04-22 | Discharge: 2022-04-23 | Discharge status: Against Medical Advice | Payer: Medicare Other | Attending: Physician Assistant | Admitting: Physician Assistant

## 2022-04-22 DIAGNOSIS — F439 Reaction to severe stress, unspecified: Secondary | ICD-10-CM | POA: Diagnosis present

## 2022-04-22 DIAGNOSIS — R300 Dysuria: Secondary | ICD-10-CM | POA: Insufficient documentation

## 2022-04-22 DIAGNOSIS — Z5321 Procedure and treatment not carried out due to patient leaving prior to being seen by health care provider: Secondary | ICD-10-CM | POA: Diagnosis not present

## 2022-04-22 DIAGNOSIS — R0789 Other chest pain: Secondary | ICD-10-CM | POA: Diagnosis not present

## 2022-04-22 DIAGNOSIS — N4889 Other specified disorders of penis: Secondary | ICD-10-CM | POA: Diagnosis not present

## 2022-04-22 LAB — BASIC METABOLIC PANEL
Anion gap: 4 — ABNORMAL LOW (ref 5–15)
BUN: 17 mg/dL (ref 8–23)
CO2: 29 mmol/L (ref 22–32)
Calcium: 9.9 mg/dL (ref 8.9–10.3)
Chloride: 105 mmol/L (ref 98–111)
Creatinine, Ser: 1.06 mg/dL (ref 0.61–1.24)
GFR, Estimated: >60 mL/min (ref >60)
Glucose, Bld: 100 mg/dL — ABNORMAL HIGH (ref 70–99)
Potassium: 4.4 mmol/L (ref 3.5–5.1)
Sodium: 138 mmol/L (ref 135–145)

## 2022-04-22 LAB — CBC WITH DIFFERENTIAL/PLATELET
Abs Immature Granulocytes: 0.04 10*3/uL (ref 0.00–0.07)
Basophils Absolute: 0 10*3/uL (ref 0.0–0.1)
Basophils Relative: 1 %
Eosinophils Absolute: 0.2 10*3/uL (ref 0.0–0.5)
Eosinophils Relative: 2 %
HCT: 41.3 % (ref 39.0–52.0)
Hemoglobin: 12.8 g/dL — ABNORMAL LOW (ref 13.0–17.0)
Immature Granulocytes: 1 %
Lymphocytes Relative: 25 %
Lymphs Abs: 2.1 10*3/uL (ref 0.7–4.0)
MCH: 30.2 pg (ref 26.0–34.0)
MCHC: 31 g/dL (ref 30.0–36.0)
MCV: 97.4 fL (ref 80.0–100.0)
Monocytes Absolute: 0.6 10*3/uL (ref 0.1–1.0)
Monocytes Relative: 7 %
Neutro Abs: 5.4 10*3/uL (ref 1.7–7.7)
Neutrophils Relative %: 64 %
Platelets: 283 10*3/uL (ref 150–400)
RBC: 4.24 MIL/uL (ref 4.22–5.81)
RDW: 16.2 % — ABNORMAL HIGH (ref 11.5–15.5)
WBC: 8.4 10*3/uL (ref 4.0–10.5)
nRBC: 0 % (ref 0.0–0.2)

## 2022-04-22 LAB — TROPONIN I (HIGH SENSITIVITY): Troponin I (High Sensitivity): 35 ng/L — ABNORMAL HIGH (ref <18)
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
## 2022-04-22 NOTE — ED Triage Notes (Signed)
Pt reports chest pressure and reports "I am just under a lot of stress." Pt also C/O penis pain.
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
## 2022-04-23 NOTE — Unmapped (Signed)
Regarding: chest pain  ----- Message from Kathrene Alu sent at 04/22/2022  5:39 PM EST -----  If you had not called [facility], what do you think you would have done?   Go to Emergency Dept

## 2022-04-23 NOTE — ED Notes (Signed)
Tech says that registration returned patient labels and that the pt left.

## 2022-04-25 ENCOUNTER — Ambulatory Visit: Admit: 2022-04-25 | Discharge: 2022-04-26 | Payer: MEDICARE

## 2022-04-25 DIAGNOSIS — N481 Balanitis: Principal | ICD-10-CM

## 2022-04-25 DIAGNOSIS — N471 Phimosis: Principal | ICD-10-CM

## 2022-04-25 NOTE — Unmapped (Signed)
The PAC has received an incoming call regarding a new or worsening symptom:    Caller name: Maudie Flakes callback number: [phone]   Describe the symptom(s): problems with private area, I wasn't able to get anymore info. From spouse. Pt is looking for an appt today (no availability in clinic)  Is this a NEW or WORSENING issue?: worsening  When did this start?: 12/5  Are you having these symptoms right now?: yes  How severe is it on a scale from 1 to 10?: 10    Is this a red flag symptom?

## 2022-04-25 NOTE — Unmapped (Deleted)
Penile swelling differential  - injry  - infection- balanitis, uresthrtis  - erectilel disorder/priapism  - inflam- contact dermatitis    -drugs?     Hx chf, RA, BPH

## 2022-04-25 NOTE — Unmapped (Signed)
Today you were seen in clinic for penile swelling and redness. Based on your physical exam, we recommend you urgently seek care at the [facility] ED. You condition needs to be treated by a urology team. Please proceed directly to the [facility] ED on [address] (see attached directions).

## 2022-04-25 NOTE — Unmapped (Signed)
FYI   Called to triage patient not available, patient advised to call triage line
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
## 2022-04-25 NOTE — Unmapped (Signed)
Called and spoke to patient who stated he was having problems with his penis. And he just returned from walking his dog     Patient stated 4 days ago his penis turned red and painful and swollen   Patient is urinating reporting he urinated twice   Patient also reporting sob, patient reporting sob with exertion but states he has sob all the time and it is not worse (hx COPD)   Symptoms have been occurring for 2 days  Patient denies cough, fever  Patient reporting burning with urination   Patient denies blood in urine and abdominal pain   Patient scheduled in [facility] to assess.  mich  Same Day Clinic Referral Intake      Patient Name: Ralph Santos    Patient MRN: [id number]    Location of Patient: Home    Source of Referral: Patient Call (Self-referral)    Referring Provider Name, Contact Number (if applicable):     Reason for Referral: red swollen penis     Procedure Anticipated?  If yes, select procedure:  ?    Diagnostic Testing Anticipated?  If yes, select test(s):  ?    Vitals:  n/a    Additional Info: None    Will patient be seen on the day of referral in Same Day Clinic? Yes

## 2022-04-26 ENCOUNTER — Ambulatory Visit
Admit: 2022-04-26 | Discharge: 2022-04-26 | Discharge status: Against Medical Advice | Payer: MEDICARE | Attending: Emergency Medicine

## 2022-04-26 DIAGNOSIS — N471 Phimosis: Principal | ICD-10-CM

## 2022-04-26 LAB — URINALYSIS WITH MICROSCOPY WITH CULTURE REFLEX
BACTERIA: NONE SEEN /HPF
BILIRUBIN UA: NEGATIVE
BLOOD UA: NEGATIVE
GLUCOSE UA: >1000 — AB
KETONES UA: NEGATIVE
NITRITE UA: NEGATIVE
PH UA: 5 (ref 5.0–9.0)
PROTEIN UA: NEGATIVE
RBC UA: 2 /HPF (ref <=3)
SPECIFIC GRAVITY UA: 1.018 (ref 1.003–1.030)
SQUAMOUS EPITHELIAL: <1 /HPF (ref 0–5)
UROBILINOGEN UA: <2
WBC UA: 3 /HPF — ABNORMAL HIGH (ref <=2)

## 2022-04-26 NOTE — Unmapped (Signed)
Reason for Disposition  . Message left on identified voice mail    Protocols used: No Contact or Duplicate Contact Call-A-AH

## 2022-04-26 NOTE — Unmapped (Signed)
I returned the phone call, left voice message.  Mr. Wares need to go to ED   Was seen in the [facility] yesterday and instructed for him to go to ED

## 2022-04-26 NOTE — Unmapped (Signed)
Spouse left message on triage line stated patient was supposed to have surgery on  his penis and he could not do it, spouse requested cal returned '  Per chart review patient advised to go to ER   Call returned patient unavailable [facility] number provided

## 2022-04-26 NOTE — Unmapped (Signed)
Emergency Department Provider Note      ED Assessment/Plan     Ralph Santos is a pleasant 76 y.o. male w/ a past medical history of BPH, CVA, CAD, HFrEF (45%), hypertension, COPD, GERD, and hyperlipidemia presenting for evaluation of 3 weeks of worsening penile swelling with dysuria.    Initial impression and pertinent physical exam:      BP 166/75  - Pulse 67  - Temp 36.4 ??C (97.5 ??F) (Oral)  - Resp 18  - Wt 60.3 kg (133 lb)  - SpO2 99%  - BMI 23.56 kg/m??     On initial evaluation vitals are reassuringly within normal limits and afebrile.  Physical exam notable for no acute distress, phimosis of the foreskin which is not able to be retracted, glands not visualized, no significant swelling or erythema on the shaft of the penis, no purulent drainage from under foreskin.    Diagnostic workup as below.     Orders Placed This Encounter   Procedures    Urine Culture    Urinalysis with Microscopy with Culture Reflex    Measure post void residual       Will reassess as we get results and update below    ED Course as of 04/26/22 1811   Fri Apr 26, 2022   1249 I spoke with urology resident [place] who states if the patient has a normal postvoid residual that he can be scheduled for outpatient dorsal slit.  I will repaged her with the results of her postvoid residual scan.   1300 Postvoid residual was 4ml.  Urology repaged with this update.   14 I spoke with urology resident [place] who states there is no indication for admission or acute intervention as the patient is not retaining.  She will come down and see the patient to facilitate urgent outpatient follow-up.         MDM:  Patient has phimosis.  Lower concern for balanitis at this time.  Normal postvoid residual.  I discussed the case with urology who was going to evaluate the patient to set up urgent clinic follow-up for likely dorsal slit.  However, the patient unfortunately chose to elope from the emergency department before I was able to discuss with him the risks of leaving.  Neurology will follow-up to schedule appointment in clinic for a dorsal slit.    Discussion of Management with other Physicians, QHP, or Appropriate Source:  Urology  Independent Interpretation of Studies: ECG; see ED Course  External Records Reviewed: 04/25/22 [facility] Internal Medicine Office Visit Note for chart review and PMH  Escalation of Care, Consideration of Admission/Observation/Transfer:  Patient eloped prior to decision being made  Social determinants that significantly affected care: N/A  Prescription drug(s) considered but not prescribed: Antibiotics: Deferred to urology who was unable to evaluate the patient before he eloped  Diagnostic tests considered but not performed: None    ____________________________________________    The case was discussed with the attending physician, who is in agreement with the above assessment and plan.     History     Chief Complaint   Patient presents with    Penis/Scrotum Problem       HPI: Ralph Santos is a pleasant 76 y.o. male w/ a past medical history of BPH, CVA, CAD, HFrEF (45%), hypertension, COPD, GERD, and hyperlipidemia presenting for evaluation of penile swelling. The patient reports 3 weeks of worsening penile swelling with dysuria. The patient is uncircumcised. Per chart review, he was recommended  zinc oxide cream after visiting [facility], but noted that it was difficult for him to open up the foreskin and apply the cream. He notes having a history of similar symptoms when he was a child, stating that he would use Vaseline to relieve his symptoms. Denies fevers.    Per chart review, the patient was seen at [facility] Internal Medicine yesterday (04/25/22) for today's symptoms. Physical exam was consistent with phimosis and balanitis; the patient was recommended to seek immediate care at this ED with a urology consult. He was not prescribed antibiotics.  They did not present to the ED yesterday.  They presented today instead.    PMH/PSH: As noted in HPI.    Allergies:   Allergies   Allergen Reactions    Shellfish Containing Products Swelling    Antihistamines - Alkylamine Other (See Comments)     Can not urinate when he take antihistamine    Loratadine-Pseudoephedrine      Urinary retention; can't urinate    Codeine Itching       Social:   Social History     Tobacco Use    Smoking status: Former     Current packs/day: 0.00     Average packs/day: 2.0 packs/day for 40.0 years (80.0 ttl pk-yrs)     Types: Cigarettes     Start date: 05/20/1942     Quit date: 05/20/1982     Years since quitting: 39.9    Smokeless tobacco: Never    Tobacco comments:     started at 76yo   Vaping Use    Vaping Use: Never used   Substance Use Topics    Alcohol use: No     Alcohol/week: 0.0 standard drinks of alcohol    Drug use: No     Comment: past use        Physical Exam      General: Awake, alert, in no acute distress  HEENT: Head atraumatic. Moist mucous membranes. No scleral icterus or conjunctivitis.    Cardiac: RRR.   Pulmonary: Breathing comfortably w/ no accessory muscle usage.   Abdominal: Soft, nontender, nondistended abdomen.    GU: Restricted phimosis without erythema or discharge, but does have tenderness of the glans. (Exam performed with chaperone Brandi).  MSK: No lower extremity edema.    Dermatologic: No jaundice or pallor.    Neurologic: Alert. Follows commands. Moving all extremities spontaneously.    Psychiatric: Normal mood and behavior.        Documentation assistance was provided by Orion Crook, Scribe, on April 26, 2022 at 11:59 AM for Elizabeth Palau, MD.     Documentation assistance was provided by the scribe in my presence.  The documentation recorded by the scribe has been reviewed by me and accurately reflects the services I personally performed.       Elizabeth Palau, PGY-2  [facility] Emergency Medicine  Pager: [phone]                    Binnie Rail, MD  Resident  04/26/22 [phone]

## 2022-04-26 NOTE — Unmapped (Signed)
Upcoming Appt:  Future Appointments   Date Time Provider Department Center   05/29/2022  3:45 PM [name], MD [facility]   08/01/2022 10:40 AM Lanetta Inch, MD [facility]       Recent:   What is the date of your last related visit?  Pt seen 04/25/22 at [facility] at [facility] and diagnosed with balanitis. Told to Go to ED   Related acute medications Rx'd:  n/a  Home treatment tried:  n/a      Relevant:   Allergies: Shellfish containing products, Antihistamines - alkylamine, Loratadine-pseudoephedrine, and Codeine  Medications: carvedilol, lasix, losartan   Health History: HLD, asthma, COPD, GERD  Weight:   Wt Readings from Last 3 Encounters:   04/25/22 60.3 kg (133 lb)   04/04/22 60.6 kg (133 lb 9.6 oz)   02/13/22 65.9 kg (145 lb 3.2 oz)        [place] [facility] patients only:  What was the date of your last cancer treatment (mm/dd/yy)?: n/a  Was the treatment oral or infusion?: n/a  Are you currently on TVEC (yes/no)?: n/a    Reason for Disposition   Health Information question, no triage required and triager able to answer question    Answer Assessment - Initial Assessment Questions  1. REASON FOR CALL or QUESTION: What is your reason for calling today? or How can I best help you? or What question do you have that I can help answer?      Patient and spouse calling to ask when he can get a surgery that he needs. Pt. Was seen yesterday, 04/25/22 at [facility] at [facility] and diagnosed with balanitis. The doctor who saw him there recommended pt. Go straight to the emergency department. Patient did not go to the ED, spouse says they could not find it. Pt. Reports his symptoms are the same as yesterday and did not require nurse triage. RN strongly encouraged spouse and pt. To go to ED as soon as they can since the doctor wanted them to go yesterday. They said they would get ready and proceed to ED.  The AVS from appt. Below (patients spouse also read triager this information on her copy):    Today you were seen in clinic for penile swelling and redness. Based on  your physical exam, we recommend you urgently seek care at the [facility]  ED. You condition needs to be treated by a urology team. Please  proceed directly to the [facility] ED on [address]    Protocols used: Information Only Call - No Triage-A-AH

## 2022-04-26 NOTE — Unmapped (Signed)
Regarding: Surgery today but there was an issue so it did not happen wanting to know if can go tomorrow  ----- Message from Eliezer Bottom sent at 04/25/2022  5:53 PM EST -----  If you had not called [facility], what do you think you would have done?   Not Appropriate to Ask

## 2022-04-26 NOTE — Unmapped (Signed)
Pt states he does not want to wait any longer and is leaving at this time. Pt informed he will have to check back in if he decides to return. Pt communicates understanding of this. Pt noted to be leaving ED at this time with steady gate, accompanied by his family member.

## 2022-04-26 NOTE — Unmapped (Signed)
I saw and evaluated the patient, participating in the key portions of the service.  I reviewed the resident’s note.  I agree with the resident’s findings and plan. Sherry Rogus R Adelfa Lozito, MD

## 2022-04-26 NOTE — Unmapped (Signed)
Pt presents from home for c/o penile swelling/redness for about 1 week. Went to UC last night and was diagnosed with phimosis and balanitis. Sent in for urology consult.

## 2022-04-29 DIAGNOSIS — N471 Phimosis: Principal | ICD-10-CM

## 2022-04-29 NOTE — Unmapped (Signed)
Called patient in response to my chart message that patient requested to speak with MD  Per chart review patient sen in ER   See below        phimosis.  Lower concern for balanitis at this time.  Normal postvoid residual.  I discussed the case with urology who was going to evaluate the patient to set up urgent clinic follow-up for likely dorsal slit.  However, the patient unfortunately chose to elope from the emergency department before I was able to discuss with him the risks of leaving.  Neurology will follow-up to schedule appointment in clinic for a dorsal slit.        Urology contacted stated surgery would need to be contacted   Advised I would contact ER provider

## 2022-04-29 NOTE — Unmapped (Signed)
The PAC has received an incoming clinical call:    Caller name: Stancil, Coelho    Best callback number: [phone]   Relationship to Patient: patient  Describe the reason for the call: Patient called and asked to speak with Dr Emelda Brothers.  He said it was personal and wouldn't tell anymore.

## 2022-04-29 NOTE — Unmapped (Addendum)
Urine Culture  Order: [id number] - Reflex for Order [id number]  Status: Final result      Clean Catch; Urine  Urine Culture, Comprehensive   10,000 to 50,000 CFU/mL Serratia marcescens     Patient with a positive urine culture. Patient was not placed on antibiotics prior to dispo from ED. EMAP messaged with results.     04/30/2022 1144: I attempted to reach patient with no answer. Generic message left with call back number.     05/01/2022 1240: I attempted to reach patient about recent urine culture. No answer at this time. Generic message left with call back number.     05/02/2022 1606: I attempted patient a third time. No answer. Generic message left with a call back number.

## 2022-04-29 NOTE — Unmapped (Unsigned)
Message left advising patient of scheduled appointment with Dr. Dan Humphreys on the 15th Requested patient also call to schedule a nurse visit for tomorrow to check PVR

## 2022-04-29 NOTE — Unmapped (Unsigned)
Reviewed urology appointment with spouse and scheduled nurse visit per MD request

## 2022-04-30 ENCOUNTER — Ambulatory Visit: Admit: 2022-04-30 | Discharge: 2022-05-01 | Payer: MEDICARE

## 2022-04-30 NOTE — Unmapped (Signed)
EMAP test result follow-up note    April 29, 2022 5:01 PM     I was contacted by the ED resource nurse Mariane Duval) with regard to [place]. He was seen in the [facility] Emergency Department on 04/26/22 at which time a clean catch urine culture was done which has returned positive for:    Urine Culture, Comprehensive 10,000 to 50,000 CFU/mL Serratia marcescens - Abnormal    Susceptibility Testing By Consultation Only      Specimen Source: Clean Catch        Resulting Agency: [facility]           Specimen Collected: 04/26/22 13:10 Last Resulted: 04/27/22 13:58             I have reviewed the patient's chart from this ED visit.  He is a 76 year old male who was evaluated for penile swelling and diagnosed with phimosis.  Urinalysis done as part of this evaluation showed trace leukocyte esterase, negative nitrite, 3 WBC, 2 RBC and less than 1 squamous epithelial cell.  The patient was discharged home with urology follow-up.  Urine culture since returned with the findings as documented above.  He did report dysuria at the time of his ED visit.  No antibiotics were given in the emergency department and he left without informing staff prior to receiving discharge instructions.  The patient does not have any antibiotic allergies documented in his chart.    The patient has follow-up scheduled in urology clinic for this Friday 12/15. Urology consult resident Dr. Alfonse Flavors paged to discuss.    5:27 PM  Case discussed with urology. The ED resource nurse will contact the patient, inform him of the positive test result(s) and a course of trimethoprim/sulfamethoxazole DS 1 tablet by mouth twice daily x 7 days will be prescribed. Return precautions should be reviewed and he should be instructed to follow-up with urology as scheduled.

## 2022-05-01 NOTE — Unmapped (Signed)
Patient asked that I call wife per nurse visit 12/12 for updates  Called wife's number and left message to receive update on Shaquil.     Fontaine No, MD  [facility] Geriatric Medicine Fellow

## 2022-05-02 NOTE — Unmapped (Signed)
Error

## 2022-05-03 ENCOUNTER — Ambulatory Visit
Admit: 2022-05-03 | Discharge: 2022-05-04 | Payer: MEDICARE | Attending: Student in an Organized Health Care Education/Training Program | Primary: Student in an Organized Health Care Education/Training Program

## 2022-05-03 DIAGNOSIS — N471 Phimosis: Principal | ICD-10-CM

## 2022-05-03 MED ORDER — BETAMETHASONE DIPROPIONATE 0.05 % TOPICAL CREAM
Freq: Two times a day (BID) | TOPICAL | 0 refills | 30 days | Status: CP
Start: 2022-05-03 — End: 2022-06-02

## 2022-05-03 MED ORDER — BETAMETHASONE VALERATE 0.1 % TOPICAL OINTMENT
Freq: Two times a day (BID) | TOPICAL | 0 refills | 30 days | Status: CP
Start: 2022-05-03 — End: 2022-05-03

## 2022-05-03 NOTE — Unmapped (Signed)
UROLOGY NEW CLINIC NOTE     Patient Name: Ralph Santos  Patient Age: 76 y.o.  Encounter Date: 05/03/2022    Referring Physician:   Pincus Sanes, MD  [address]  [place],  [place] [address]    PCP:   [name], MD  Chief Complaint   Patient presents with    Other     Pt present for phimosis. Pain       Assessment  Ralph Santos is a 76 y.o. male with phimosis for at least two months for which he has not been prescribed any topical steroid. For this reason, we recommend a trial of topical betamethasone ointment prior to proceeding with surgical intervention via circumcision. We instructed the patient about how to use this ointment and offered to arrange follow-up for him to be reexamined in 4-6 weeks. However, he preferred to contact us in a month by phone to inform us as to whether he wishes to proceed with surgery or not based on how his phimosis responds to conservative therapy.    Plan:  - Apply betamethasone ointment to phimotic band BID x 30 days.  - Patient will call in 30 days to inform us if he wishes to proceed with circumcision or not.    HPI:   76 y.o. male seen in consultation at the request of Luciano-Feijoo', Mariec* for phimosis, prompting his presentation to the [facility] ED on 04/26/22. He reports that he first developed phimosis over two months ago but was still able to retract his foreskin until about four weeks ago when he stopped being able to do so. He is now applying Vaseline to his inner prepuce with a toothbrush but remains unable to retract his foreskin. He has not been prescribed a topical steroid cream or ointment for this purpose.    He otherwise has no urologic history and no difficulty voiding aside from prepucial burning when he voids. His PVR in the ED on 04/26/22 was reassuringly low at only 4 mL.    PMH:  Past Medical History:   Diagnosis Date    Arthritis     rheumatoid arthritis    Asthma     Barrett's esophagus     Blepharitis, unspecified     BPH (benign prostatic hyperplasia)     Cataract     Cataract Nuclear Sclerosis    COPD (chronic obstructive pulmonary disease) (CMS-HCC)     Coronary artery disease     Dysuria     intermittent    Hand injury     Heart disease     HL (hearing loss)     Hypertension     Joint pain     Osteoarthritis     Shoulder injury     Sleep apnea     does not wear CPAP    Spells     Stroke (CMS-HCC)     mini stroke May 2014, documented as CVA in 2012    Tear film insufficiency, unspecified        PSH:  Past Surgical History:   Procedure Laterality Date    CARDIAC CATHETERIZATION      CARPAL TUNNEL RELEASE      CHOLECYSTECTOMY      ESOPHAGOGASTRODUODENOSCOPY      HERNIA REPAIR      PR CATH PLACE/CORON ANGIO, IMG SUPER/INTERP,W LEFT HEART VENTRICULOGRAPHY N/A 08/03/2021    Procedure: Left Heart Catheterization;  Surgeon: Autumn Messing, MD;  Location: [facility] CATH;  Service: Cardiology  PR COLONOSCOPY FLX DX W/COLLJ SPEC WHEN PFRMD N/A 07/20/2018    Procedure: COLONOSCOPY, FLEXIBLE, PROXIMAL TO SPLENIC FLEXURE; DIAGNOSTIC, W/WO COLLECTION SPECIMEN BY BRUSH OR WASH;  Surgeon: Harle Battiest, MD;  [facility]: [facility];  Service: Gastroenterology    PR COLONOSCOPY FLX DX W/COLLJ SPEC WHEN PFRMD N/A 06/11/2021    Procedure: COLONOSCOPY, FLEXIBLE, PROXIMAL TO SPLENIC FLEXURE; DIAGNOSTIC, W/WO COLLECTION SPECIMEN BY BRUSH OR WASH;  Surgeon: Harle Battiest, MD;  Location: [facility];  Service: Gastroenterology    PR EDG ABLATE TUMOR POLYP/LESION W/DILATION& WIRE N/A 02/06/2015    Procedure: EGD, FLEXIBLE, TRANSORAL; WITH ABLATION OF TUMOR(S), POLYP(S), OR OTHER LESION(S) (INCLUDEDS PRE-AND POST-DILATION AND GUIDE WIRE PASSAGE, WHEN PERFORMED);  Surgeon: Elyn Aquas, MD;  Location: [facility];  Service: Gastroenterology    PR ESOPHAGOSCOPY FLEX TRANSORAL LESION ABLATION  10/11/2019    Procedure: ESOPHAGOSCOPY, FLEXIBLE, TRANSORAL; WITH ABLATION OF TUMOR(S), POLYP(S), OR OTHER LESION(S) (INCLUDES PRE- AND POST-DILATION AND GUIDE WIRE PASSAGE, WHEN PERFORMED);  Surgeon: Harle Battiest, MD;  Location: [facility];  Service: Gastroenterology    PR LARYNGOSCOPY,DIR,OP,EXC TUMR,LCL FLAP Midline 12/29/2013    Procedure: LARYNGOSCOPY, DIRECT OP, MICRO/TELESCOPE W/SUBMUC REMOVE NON-NEOPLAS LESION(S) VOC CORD;RECONS W/LOCAL FLAP;  Surgeon: Christene Lye, MD;  Location: MAIN OR [place];  Service: ENT    PR UPPER GI ENDOSCOPY,BIOPSY N/A 10/13/2012    Procedure: UGI ENDOSCOPY; WITH BIOPSY, SINGLE OR MULTIPLE;  Surgeon: Elyn Aquas, MD;  Location: [facility];  Service: Gastroenterology    PR UPPER GI ENDOSCOPY,BIOPSY N/A 12/29/2013    Procedure: UGI ENDOSCOPY; WITH BIOPSY, SINGLE OR MULTIPLE;  Surgeon: Elyn Aquas, MD;  Location: [facility];  Service: Gastroenterology    PR UPPER GI ENDOSCOPY,BIOPSY N/A 08/07/2015    Procedure: UGI ENDOSCOPY; WITH BIOPSY, SINGLE OR MULTIPLE;  Surgeon: Harle Battiest, MD;  Location: [facility];  Service: Gastroenterology    PR UPPER GI ENDOSCOPY,BIOPSY N/A 07/22/2016    Procedure: UGI ENDOSCOPY; WITH BIOPSY, SINGLE OR MULTIPLE;  Surgeon: Harle Battiest, MD;  Location: [facility];  Service: Gastroenterology    PR UPPER GI ENDOSCOPY,BIOPSY N/A 07/21/2017    Procedure: UGI ENDOSCOPY; WITH BIOPSY, SINGLE OR MULTIPLE;  Surgeon: Harle Battiest, MD;  Location: [facility];  Service: Gastroenterology    PR UPPER GI ENDOSCOPY,BIOPSY N/A 07/20/2018    Procedure: UGI ENDOSCOPY; WITH BIOPSY, SINGLE OR MULTIPLE;  Surgeon: Harle Battiest, MD;  Location: [facility];  Service: Gastroenterology    PR UPPER GI ENDOSCOPY,BIOPSY N/A 08/09/2019    Procedure: UGI ENDOSCOPY; WITH BIOPSY, SINGLE OR MULTIPLE;  Surgeon: Harle Battiest, MD;  Location: [facility];  Service: Gastroenterology    PR UPPER GI ENDOSCOPY,BIOPSY N/A 01/10/2020    Procedure: UGI ENDOSCOPY; WITH BIOPSY, SINGLE OR MULTIPLE;  Surgeon: Harle Battiest, MD;  Location: [facility];  Service: Gastroenterology    PR UPPER GI ENDOSCOPY,BIOPSY N/A 07/10/2020    Procedure: UGI ENDOSCOPY; WITH BIOPSY, SINGLE OR MULTIPLE;  Surgeon: Harle Battiest, MD;  Location: [facility];  Service: Gastroenterology    PR UPPER GI ENDOSCOPY,BIOPSY N/A 06/11/2021    Procedure: UGI ENDOSCOPY; WITH BIOPSY, SINGLE OR MULTIPLE;  Surgeon: Harle Battiest, MD;  Location: [facility];  Service: Gastroenterology    SHOULDER SURGERY         Medications:  Current Outpatient Medications   Medication  Sig Dispense Refill    albuterol HFA 90 mcg/actuation inhaler Inhale 2 puffs every four (4) hours as needed for wheezing. 9 g 3    amoxicillin-clavulanate (AUGMENTIN) 500-125 mg per tablet Take 1 tablet by mouth Three (3) times a day. 15 tablet 0    aspirin (ECOTRIN) 81 MG tablet Take 1 tablet (81 mg total) by mouth daily.      atorvastatin (LIPITOR) 40 MG tablet Take 1 tablet by mouth once daily 90 tablet 3    carvediloL (COREG) 6.25 MG tablet Take 1 tablet (6.25 mg total) by mouth two (2) times a day. 90 tablet 3    diclofenac sodium (VOLTAREN) 1 % gel Apply 2 g topically four (4) times a day. 100 g 0    finasteride (PROSCAR) 5 mg tablet Take 1 tablet (5 mg total) by mouth daily. 90 tablet 3    folic acid (FOLVITE) 1 MG tablet Take 1 tablet (1,000 mcg total) by mouth daily. 90 tablet 3    furosemide (LASIX) 20 MG tablet TAKE AS DIRECTED AS NEEDED 60 tablet 0    gabapentin (NEURONTIN) 100 MG capsule TAKE 1 CAPSULE BY MOUTH THREE TIMES DAILY 270 capsule 3    ibuprofen (ADVIL,MOTRIN) 600 MG tablet       losartan (COZAAR) 25 MG tablet Take 1 tablet (25 mg total) by mouth daily. 90 tablet 3    methotrexate sodium (METHOTREXATE, CONTAINS PRESERVATIVES,) 25 mg/mL injection solution Inject 1 mL (25 mg total) under the skin once a week. 12 mL 1    mirtazapine (REMERON) 7.5 MG tablet Take 1 tablet (7.5 mg total) by mouth nightly. 90 tablet 3    nitroglycerin (NITROSTAT) 0.4 MG SL tablet Place 1 tablet (0.4 mg total) under the tongue every five (5) minutes as needed for chest pain. Maximum of 3 doses in 15 minutes. 25 tablet 0    omeprazole (PRILOSEC) 40 MG capsule Take 1 capsule (40 mg total) by mouth daily. 90 capsule 3    oxybutynin (DITROPAN-XL) 5 MG 24 hr tablet Take 1 tablet (5 mg total) by mouth daily. 90 tablet 3    predniSONE (DELTASONE) 10 MG tablet Take 1 tablet (10 mg total) by mouth daily. For insect bites 7 tablet 0    sulfaSALAzine (AZULFIDINE) 500 MG EC tablet Take 1 tablet (500 mg total) by mouth Three (3) times a day. 270 tablet 3    tamsulosin (FLOMAX) 0.4 mg capsule Take 1 capsule (0.4 mg total) by mouth daily. 90 capsule 3    umeclidinium-vilanteroL (ANORO ELLIPTA) 62.5-25 mcg/actuation inhaler Inhale 1 puff daily. 60 each 2    betamethasone valerate (VALISONE) 0.1 % ointment Apply topically two (2) times a day. 30 g 0    fluticasone propionate (FLONASE) 50 mcg/actuation nasal spray 1 spray into each nostril two (2) times a day for 14 days. 16 g 3     No current facility-administered medications for this visit.       Allergies:  Shellfish containing products, Antihistamines - alkylamine, Loratadine-pseudoephedrine, and Codeine     Social History:  Patient  reports that he quit smoking about 39 years ago. His smoking use included cigarettes. He started smoking about 80 years ago. He has a 80.0 pack-year smoking history. He has never used smokeless tobacco. He reports that he does not drink alcohol and does not use drugs.    Family History:  The patient's family history includes Allergies in his sister; Cancer in his sister; Cataracts in his sister; Diabetes in  his brother, brother, sister, and sister; Drug abuse in his sister; Emphysema in his father; Heart attack in his brother and brother; Heart disease in his brother and mother; Hypertension in his brother, father, and sister; Kidney disease in his mother; Prostate cancer in his paternal grandfather.    ROS:   A comprehensive 10-system review was negative, except as noted in HPI.    BP 185/85 (BP Site: R Arm, BP Position: Sitting, BP Cuff Size: Medium)  - Pulse 70  - Ht 160 cm (5' 3)  - Wt 63.5 kg (140 lb 1.6 oz)  - BMI 24.82 kg/m??     Physical Exam:    General: well developed, well nourished, no acute distress  HEENT: normocephalic, atraumatic  RESPIRATORY: non-labored breathing  CVD no peripheral edema   MSK: no cyanosis, normal gait  SKIN: no obvious rashes, lesions or ulcers   GU: no CVA, flank, or suprapubic tenderness. Tight phimotic band with mild overlying erythema and skin breakdown. No evidence of lichen sclerosis.    Most Recent Labs:  Lab Results   Component Value Date    WBC 8.8 01/29/2022    HGB 13.0 01/29/2022    HCT 39.7 01/29/2022    PLT 227 01/29/2022       Lab Results   Component Value Date    NA 142 11/15/2021    K 3.9 11/15/2021    CL 108 (H) 11/15/2021    CO2 27.9 11/15/2021    BUN 12 11/15/2021    CREATININE 1.08 01/29/2022    CALCIUM 9.8 11/15/2021    MG 1.8 05/03/2020    PHOS 3.5 12/27/2015       IMAGING:  No results found.

## 2022-05-04 DIAGNOSIS — I5043 Acute on chronic combined systolic (congestive) and diastolic (congestive) heart failure: Principal | ICD-10-CM

## 2022-05-04 MED ORDER — HYDROXYZINE HCL 25 MG TABLET
ORAL_TABLET | Freq: Two times a day (BID) | ORAL | 0 refills | 0 days | PRN
Start: 2022-05-04 — End: ?

## 2022-05-04 MED ORDER — FUROSEMIDE 20 MG TABLET
ORAL_TABLET | 0 refills | 0 days
Start: 2022-05-04 — End: ?

## 2022-05-06 NOTE — Unmapped (Signed)
Left message to follow-up recent specialist visit.

## 2022-05-15 DIAGNOSIS — I5043 Acute on chronic combined systolic (congestive) and diastolic (congestive) heart failure: Principal | ICD-10-CM

## 2022-05-15 MED ORDER — FUROSEMIDE 20 MG TABLET
ORAL_TABLET | 0 refills | 0 days | Status: CP
Start: 2022-05-15 — End: ?

## 2022-05-21 MED ORDER — FUROSEMIDE 20 MG TABLET
ORAL_TABLET | 0 refills | 0 days
Start: 2022-05-21 — End: ?

## 2022-05-24 NOTE — Unmapped (Signed)
Normal exam today without evidence of fluid overload.
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
## 2022-05-24 NOTE — Unmapped (Signed)
01/2722 office visit:   Weight is now better.  He had a questionable lesion on his pancreas on a CT chest in June 2023:   Incidental imaging of the cephalad abdomen is remarkable for a slowly enlarging low-attenuation lesion associated the tail of pancreas (image 133 series 2). The lesion measures approximately 15 mm on this exam (10 mm September 2018). Though favored reflect benign change, consider CT imaging of the abdomen/pelvis for better characterization and further investigation of the patient's clinical presentation with unintended weight loss.      A CT abdomen is now scheduled for next week.

## 2022-05-24 NOTE — Unmapped (Signed)
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  Resolved with rest in less than 5 minutes.  He remains very active.  He does not carry NTG, I will order.

## 2022-05-24 NOTE — Unmapped (Signed)
Seen 05/03/22 in urology, summary:  77 y.o. male with phimosis for at least two months for which he has not been prescribed any topical steroid. For this reason, we recommend a trial of topical betamethasone ointment prior to proceeding with surgical intervention via circumcision. We instructed the patient about how to use this ointment and offered to arrange follow-up for him to be reexamined in 4-6 weeks. However, he preferred to contact us in a month by phone to inform us as to whether he wishes to proceed with surgery or not based on how his phimosis responds to conservative therapy.  ??  Plan:  - Apply betamethasone ointment to phimotic band BID x 30 days.  - Patient will call in 30 days to inform us if he wishes to proceed with circumcision or not.

## 2022-05-25 NOTE — Unmapped (Signed)
LWBS/AMA Patient Follow-Up Call  I attempted to call patient at [phone] due to recent AMA from the ED. Patient did not answer at listed phone number at this time. Generic voicemail left with call back number.

## 2022-05-27 ENCOUNTER — Ambulatory Visit: Admit: 2022-05-27 | Discharge: 2022-05-28 | Payer: MEDICAID

## 2022-05-27 DIAGNOSIS — R634 Abnormal weight loss: Principal | ICD-10-CM

## 2022-05-27 DIAGNOSIS — J449 Chronic obstructive pulmonary disease, unspecified: Principal | ICD-10-CM

## 2022-05-27 DIAGNOSIS — B079 Viral wart, unspecified: Principal | ICD-10-CM

## 2022-05-27 DIAGNOSIS — N471 Phimosis: Principal | ICD-10-CM

## 2022-05-27 DIAGNOSIS — I2510 Atherosclerotic heart disease of native coronary artery without angina pectoris: Principal | ICD-10-CM

## 2022-05-27 DIAGNOSIS — I5042 Chronic combined systolic (congestive) and diastolic (congestive) heart failure: Principal | ICD-10-CM

## 2022-05-27 LAB — CBC W/ AUTO DIFF
BASOPHILS ABSOLUTE COUNT: 0.1 10*9/L (ref 0.0–0.1)
BASOPHILS RELATIVE PERCENT: 1 %
EOSINOPHILS ABSOLUTE COUNT: 0.3 10*9/L (ref 0.0–0.5)
EOSINOPHILS RELATIVE PERCENT: 2.7 %
HEMATOCRIT: 40.7 % (ref 39.0–48.0)
HEMOGLOBIN: 13.2 g/dL (ref 12.9–16.5)
LYMPHOCYTES ABSOLUTE COUNT: 2.5 10*9/L (ref 1.1–3.6)
LYMPHOCYTES RELATIVE PERCENT: 26.1 %
MEAN CORPUSCULAR HEMOGLOBIN CONC: 32.4 g/dL (ref 32.0–36.0)
MEAN CORPUSCULAR HEMOGLOBIN: 30 pg (ref 25.9–32.4)
MEAN CORPUSCULAR VOLUME: 92.7 fL (ref 77.6–95.7)
MEAN PLATELET VOLUME: 7.6 fL (ref 6.8–10.7)
MONOCYTES ABSOLUTE COUNT: 1.2 10*9/L — ABNORMAL HIGH (ref 0.3–0.8)
MONOCYTES RELATIVE PERCENT: 13.2 %
NEUTROPHILS ABSOLUTE COUNT: 5.3 10*9/L (ref 1.8–7.8)
NEUTROPHILS RELATIVE PERCENT: 57 %
PLATELET COUNT: 286 10*9/L (ref 150–450)
RED BLOOD CELL COUNT: 4.4 10*12/L (ref 4.26–5.60)
RED CELL DISTRIBUTION WIDTH: 18.5 % — ABNORMAL HIGH (ref 12.2–15.2)
WBC ADJUSTED: 9.4 10*9/L (ref 3.6–11.2)

## 2022-05-27 LAB — B-TYPE NATRIURETIC PEPTIDE: B-TYPE NATRIURETIC PEPTIDE: 533.96 pg/mL — ABNORMAL HIGH (ref <=100)

## 2022-05-27 LAB — BASIC METABOLIC PANEL
ANION GAP: 5 mmol/L (ref 5–14)
BLOOD UREA NITROGEN: 14 mg/dL (ref 9–23)
BUN / CREAT RATIO: 15
CALCIUM: 10.2 mg/dL (ref 8.7–10.4)
CHLORIDE: 104 mmol/L (ref 98–107)
CO2: 30.7 mmol/L (ref 20.0–31.0)
CREATININE: 0.95 mg/dL
EGFR CKD-EPI (2021) MALE: 82 mL/min/{1.73_m2} (ref >=60)
GLUCOSE RANDOM: 89 mg/dL (ref 70–179)
POTASSIUM: 4 mmol/L (ref 3.4–4.8)
SODIUM: 140 mmol/L (ref 135–145)

## 2022-05-27 MED ORDER — FLUTICASONE PROPIONATE 50 MCG/ACTUATION NASAL SPRAY,SUSPENSION
Freq: Two times a day (BID) | NASAL | 3 refills | 60 days | Status: CP
Start: 2022-05-27 — End: 2022-06-10

## 2022-05-27 MED ORDER — HYDROXYZINE HCL 25 MG TABLET
ORAL_TABLET | Freq: Two times a day (BID) | ORAL | 0 refills | 10 days | PRN
Start: 2022-05-27 — End: ?

## 2022-05-27 MED ORDER — FINASTERIDE 5 MG TABLET
ORAL_TABLET | Freq: Every day | ORAL | 3 refills | 90 days | Status: CP
Start: 2022-05-27 — End: ?

## 2022-05-27 MED ORDER — ATORVASTATIN 40 MG TABLET
ORAL_TABLET | Freq: Every day | ORAL | 3 refills | 90 days | Status: CP
Start: 2022-05-27 — End: 2023-05-22
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
## 2022-05-28 NOTE — Unmapped (Signed)
Wart on right forefinger, I used liquid nitrogen to see if this help it to slough.

## 2022-05-29 NOTE — Unmapped (Signed)
Left voice mail that his labs and x-ray were OK.  Reminded him to bring all his medications when he comes for a med rec with Shela Nevin, Pharm D next week.
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
## 2022-05-30 NOTE — Unmapped (Incomplete)
Dear Mr. Jirak,      It was great seeing you again today! Here's our visit summary. Please contact me if you have any questions -- either via telephone or  [facility].      --  My Best,  Orlinda Blalock, PharmD, BCPS, CPP  [facility]  [facility] [place]  Telephone: [phone]    For emergencies, please call clinic line [phone]) and if after hours, follow the prompts (Option # 1) to speak to a nurse.

## 2022-06-04 ENCOUNTER — Ambulatory Visit: Admit: 2022-06-04 | Discharge: 2022-06-05 | Payer: MEDICAID | Attending: Pharmacotherapy | Primary: Pharmacotherapy

## 2022-06-04 MED ORDER — NITROGLYCERIN 0.4 MG SUBLINGUAL TABLET
ORAL_TABLET | SUBLINGUAL | 1 refills | 1 days | Status: CP | PRN
Start: 2022-06-04 — End: 2023-06-04

## 2022-06-04 NOTE — Unmapped (Addendum)
Important notes from med review:  We have noted Jardiance 10 mg tablet is not in all the morning pillboxes consistently. It should be one tablet each day-- in the morning pillbox ONLY (not the evening pillbox).    Did not see low dose aspirin 81 mg today--please make sure this is 1 tablet in AM pillboxes.    OK to still maintain one omeprazole 20 mg capsule each evening. May use Tums for breakthrough symptoms.    Anoro inhaler (long acting inhaler) is only 1 puff daily.    Refilled nitroglycerin tablet to [facility]    AM box:  Finasteride 5 mg tablet - 1  Gabapentin 100 mg capsule - 1  Carvedilol 6.25 mg tablet - 1  B12 tab - 1  Aspirin 81 tab - 1  Sulfasalazine 500 mg - 1  Jardiance 10 mg tablet - 1    PM box:   Gabapentin 100 mg capsule - 2  Omeprazole 20 mg capsule - 1  Carvedilol 6.25 mg tab - 1  Mirtazapine 7.5 mg tab -1  Tamsulosin 0.4 mg capsule - 1  Folic acid 1 mg tab - 1  Sulfasalazine 500 mg  tab - 2  Atorvastatin 40 mg tab - 1  Oxybutynin ER 5 mg tab - 1    NO losartan for now, No furosemide (lasix) for now

## 2022-06-11 ENCOUNTER — Ambulatory Visit
Admit: 2022-06-11 | Discharge: 2022-06-12 | Disposition: A | Discharge status: Home / Self Care | Payer: MEDICAID | Attending: Emergency Medicine

## 2022-06-11 ENCOUNTER — Emergency Department
Admit: 2022-06-11 | Discharge: 2022-06-12 | Disposition: A | Discharge status: Home / Self Care | Payer: MEDICARE | Attending: Emergency Medicine

## 2022-06-11 LAB — APTT
APTT: 27 s (ref 24.8–38.4)
HEPARIN CORRELATION: 0.2

## 2022-06-11 LAB — COMPREHENSIVE METABOLIC PANEL
ALBUMIN: 4 g/dL (ref 3.4–5.0)
ALKALINE PHOSPHATASE: 33 U/L — ABNORMAL LOW (ref 46–116)
ALT (SGPT): 13 U/L (ref 10–49)
ANION GAP: 7 mmol/L (ref 5–14)
BILIRUBIN TOTAL: 0.6 mg/dL (ref 0.3–1.2)
BLOOD UREA NITROGEN: 14 mg/dL (ref 9–23)
BUN / CREAT RATIO: 15
CALCIUM: 10.8 mg/dL — ABNORMAL HIGH (ref 8.7–10.4)
CHLORIDE: 105 mmol/L (ref 98–107)
CO2: 28 mmol/L (ref 20.0–31.0)
CREATININE: 0.94 mg/dL
EGFR CKD-EPI (2021) MALE: 83 mL/min/{1.73_m2} (ref >=60)
GLUCOSE RANDOM: 85 mg/dL (ref 70–179)
PROTEIN TOTAL: 7.6 g/dL (ref 5.7–8.2)
SODIUM: 140 mmol/L (ref 135–145)

## 2022-06-11 LAB — URINALYSIS WITH MICROSCOPY WITH CULTURE REFLEX
BACTERIA: NONE SEEN /HPF
BILIRUBIN UA: NEGATIVE
BLOOD UA: NEGATIVE
GLUCOSE UA: >1000 — AB
LEUKOCYTE ESTERASE UA: NEGATIVE
NITRITE UA: NEGATIVE
PH UA: 5.5 (ref 5.0–9.0)
PROTEIN UA: 70 — AB
RBC UA: <1 /HPF (ref <=3)
SPECIFIC GRAVITY UA: 1.023 (ref 1.003–1.030)
SQUAMOUS EPITHELIAL: <1 /HPF (ref 0–5)
UROBILINOGEN UA: <2
WBC UA: <1 /HPF (ref <=2)

## 2022-06-11 LAB — SALICYLATE LEVEL: SALICYLATE LEVEL: <3 mg/dL (ref <=30.0)

## 2022-06-11 LAB — CBC
HEMATOCRIT: 40.3 % (ref 39.0–48.0)
HEMOGLOBIN: 13.5 g/dL (ref 12.9–16.5)
MEAN CORPUSCULAR HEMOGLOBIN CONC: 33.5 g/dL (ref 32.0–36.0)
MEAN CORPUSCULAR HEMOGLOBIN: 30.2 pg (ref 25.9–32.4)
MEAN CORPUSCULAR VOLUME: 90.3 fL (ref 77.6–95.7)
MEAN PLATELET VOLUME: 8 fL (ref 6.8–10.7)
PLATELET COUNT: 280 10*9/L (ref 150–450)
RED BLOOD CELL COUNT: 4.47 10*12/L (ref 4.26–5.60)
RED CELL DISTRIBUTION WIDTH: 17.7 % — ABNORMAL HIGH (ref 12.2–15.2)
WBC ADJUSTED: 6.5 10*9/L (ref 3.6–11.2)

## 2022-06-11 LAB — LIPID PANEL
CHOLESTEROL/HDL RATIO SCREEN: 5.2 — ABNORMAL HIGH (ref 1.0–4.5)
CHOLESTEROL: 160 mg/dL (ref <=200)
HDL CHOLESTEROL: 31 mg/dL — ABNORMAL LOW (ref 40–60)
LDL CHOLESTEROL CALCULATED: 112 mg/dL — ABNORMAL HIGH (ref 40–99)
NON-HDL CHOLESTEROL: 129 mg/dL (ref 70–130)
TRIGLYCERIDES: 84 mg/dL (ref 0–150)
VLDL CHOLESTEROL CAL: 16.8 mg/dL (ref 12–42)

## 2022-06-11 LAB — PROTIME-INR
INR: 1.05
PROTIME: 11.7 s (ref 9.9–12.6)

## 2022-06-11 LAB — ACETAMINOPHEN LEVEL: ACETAMINOPHEN LEVEL: <2 ug/mL (ref <=20.0)

## 2022-06-11 LAB — HEMOGLOBIN A1C
ESTIMATED AVERAGE GLUCOSE: 108 mg/dL
HEMOGLOBIN A1C: 5.4 % (ref 4.8–5.6)

## 2022-06-11 LAB — BLOOD GAS, VENOUS
BASE EXCESS VENOUS: 4.6 — ABNORMAL HIGH (ref -2.0–2.0)
HCO3 VENOUS: 30 mmol/L — ABNORMAL HIGH (ref 22–27)
O2 SATURATION VENOUS: 60.6 % (ref 40.0–85.0)
PCO2 VENOUS: 49 mmHg (ref 40–60)
PH VENOUS: 7.4 (ref 7.32–7.43)
PO2 VENOUS: 36 mmHg (ref 30–55)

## 2022-06-11 LAB — POTASSIUM: POTASSIUM: 4.1 mmol/L (ref 3.4–4.8)

## 2022-06-11 LAB — HIGH SENSITIVITY TROPONIN I - SINGLE: HIGH SENSITIVITY TROPONIN I: 43 ng/L (ref <=53)

## 2022-06-11 LAB — ETHANOL: ETHANOL: <10 mg/dL (ref <=10.0)

## 2022-06-11 LAB — AST: AST (SGOT): 25 U/L (ref <=34)

## 2022-06-11 LAB — TSH: THYROID STIMULATING HORMONE: 1.668 u[IU]/mL (ref 0.550–4.780)

## 2022-06-11 NOTE — Unmapped (Signed)
error 

## 2022-06-12 LAB — LIPID PANEL
CHOLESTEROL/HDL RATIO SCREEN: 5.1 — ABNORMAL HIGH (ref 1.0–4.5)
CHOLESTEROL: 157 mg/dL (ref <=200)
HDL CHOLESTEROL: 31 mg/dL — ABNORMAL LOW (ref 40–60)
LDL CHOLESTEROL CALCULATED: 110 mg/dL — ABNORMAL HIGH (ref 40–99)
NON-HDL CHOLESTEROL: 126 mg/dL (ref 70–130)
TRIGLYCERIDES: 79 mg/dL (ref 0–150)
VLDL CHOLESTEROL CAL: 15.8 mg/dL (ref 12–42)

## 2022-06-12 LAB — HIGH SENSITIVITY TROPONIN I - SINGLE: HIGH SENSITIVITY TROPONIN I: 54 ng/L (ref <=53)

## 2022-06-12 LAB — TOXICOLOGY SCREEN, URINE
AMPHETAMINE SCREEN URINE: NEGATIVE
BARBITURATE SCREEN URINE: NEGATIVE
BENZODIAZEPINE SCREEN, URINE: NEGATIVE
BUPRENORPHINE, URINE SCREEN: NEGATIVE
CANNABINOID SCREEN URINE: NEGATIVE
COCAINE(METAB.)SCREEN, URINE: NEGATIVE
FENTANYL SCREEN, URINE: NEGATIVE
METHADONE SCREEN, URINE: NEGATIVE
OPIATE SCREEN URINE: NEGATIVE
OXYCODONE SCREEN URINE: NEGATIVE

## 2022-06-12 MED ADMIN — iohexol (OMNIPAQUE) 350 mg iodine/mL solution 75 mL: 75 mL | INTRAVENOUS | @ 09:00:00 | Stop: 2022-06-12
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
## 2022-06-12 NOTE — Unmapped (Signed)
Called patient and wife to follow up on recent ED visit 06/11/2022. Recommendations had been provided by Neurology team under the presupposition that he would be able to undergo follow up evaluation with further recommendations the following morning. However, patient reportedly exhibited significant improvement with respect to weakness, gait, and mental status, and was thus discharged prior to this follow up evaluation.     Wife affirms that patient had returned to baseline with respect to weakness and gait, though remains out of it and in [place].     Given that patient's toxic-metabolic workup was overall unrevealing for clear etiology of this patient's acute change, and because he remains not at baseline, patient was recommended to re-present to ED for further evaluation with MRI. Wife expressed understanding and agreement with this recommendation.    Request was submitted to be seen in Neurology clinic at next available opening - however, wife was advised that we would prioritize obtaining MRI data as his current symptoms are poorly localizing and that providing clear recommendations in absence of such data may be difficult.     Wife was provided with contact information to [facility] clinic as well as instructions on contacting our after-hours emergency line. She was encouraged to call 9-1-1 or present to ED with any concerns for emergency.

## 2022-06-12 NOTE — Unmapped (Signed)
Pt arrives ambulatory with unsteady gait to NF with wife reporting stroke-like symptoms. Wife reports pt has been staggering, confused, and slurring his speech since waking up this morning making his LKN at 2300 last night at bedtime. Woke up at 0900 with these symptoms. Neuro exam confirmed reported symptoms and also revealed RLE weakness. Dr. Georgina Peer called to front desk for eval who has activated code stroke at this time. Charge RN aware.

## 2022-06-12 NOTE — Unmapped (Signed)
Bed: 12-A  Expected date:   Expected time:   Means of arrival:   Comments:  RM 2

## 2022-06-12 NOTE — Unmapped (Signed)
Follow-up Appointment Request  Provider: Sung Amabile (if 1/26 at 1:15om available), or Fatemeh Sadeghifar (if 1/26 at 3:45pm available) or Alan Ripper Books (if 1/25 at 3:05pm available)  Reason/Diagnosis: ED follow up for AMS, transient   Priority level: Next available, to be seen this week if able, as above

## 2022-06-12 NOTE — Unmapped (Signed)
[facility]  Emergency Department Provider Note      ED Clinical Impression      Final diagnoses:   Altered mental status, unspecified altered mental status type (Primary)   Dysarthria            Impression, Medical Decision Making, Progress Notes and Critical Care      Impression, Differential Diagnosis and Plan of Care    77 year old male with history of non-obstructive CAD, combined systolic and diastolic CHF, COPD who presents to the emergency room evaluation of altered mental status, slurred speech and expressive aphasia --  last known well was 11 PM last night before bed.  He awoke this morning at 10 AM when wife noted the patient seemed confused (at baseline is A/O x 3, but today is alert and oriented to self only).  Patient also complains of subjective right leg weakness today that was also present upon waking at 10 AM.    On exam: He is awake, alert and oriented to self only.  He is confused to his location and year.  He does not appear toxic.  Vitals BP 161/82, heart rate 106, afebrile, SpO2 93% on room air.  Cardiac rhythm is regular.  Lungs are clear.  Abdomen soft, nontender nondistended.  He has symmetric 5/5  strength and sensation of bilateral upper and lower extremities.  No facial droop.  Dysarthria is present but no aphasia. No hemiextinction.     Differential diagnosis includes CVA versus toxic/metabolic disturbance versus infection (such as UTI/pneumonia) verus endocrinopathy (such as hypo/hyperthyroidism) amongst multiple other potential etiologies.  Patient was activated as a code stroke given his dysarthria and was evaluated contemporaneously with neurology.  CT head without ICH.  Neurology has recommended proceeding with toxic/metabolic workup as they have lower concern for stroke, specifically lower concern for LVO in setting of symmetric strength and sensation bilaterally.  Will check basic labs, chest x-ray, urinalysis, tox screen.  Patient is not a tPA candidate given last known well greater than 4.5 hours ago.  See course below.    BP 161/82  - Pulse 106  - Temp 36.4 ??C (97.5 ??F) (Oral)  - Resp 27  - SpO2 93%     Discussion of Management with other Physicians, QHP, or Appropriate Source:   Discussion with other professionals: Consultant - neurology (see above).      Independent Interpretation of Studies:   Independent interpretation: CT scan(s) - no ICH  Chest x-ray: No lobar infiltrates.  EKG: See below.    External Records Reviewed:   -Reviewed 05/27/22 [facility] geriatric medicine office visit.        Additional Progress Notes    ED Course as of 06/12/22 1125   Tue Jun 11, 2022   1902 CT head:  IMPRESSION:  No evidence of acute intracranial pathology.     1952 EKG interpreted by me shows normal sinus rhythm, rate 88, normal axis, normal intervals, no ST segment elevation.  Patient does have T wave inversions in leads V4 through V6 and I and aVL.  When compared to prior EKG from 08/03/2021, T wave inversions are new.  He is having no chest pain.  Troponin is currently in process.   2250 Patient reassessed, continues to complain of slurred speech but notes this has significantly improved compared to earlier today. He reports he is feeling better compared to arrival. Wife feels pt is now mentating more at baseline and she reports to her the pt's speech sounds normal to her.  On repeat exam he no longer has objective dysarthria. Urine has been sent and we are currently pending results of UA.  Pt care will be signed out to oncoming resident at 11PM at shift change pending CTA neck to assess for cervical vessel stenosis. Neuro feels no indication for MRI at this time. Pt already on ASA and statin at home.          Portions of this record have been created using Scientist, clinical (histocompatibility and immunogenetics). Dictation errors have been sought, but may not have been identified and corrected.      The case was discussed with the attending physician who is in agreement with the above assessment and plan. ____________________________________________         History        Reason for Visit  Stroke Alert      HPI   Ralph Santos is a 77 y.o. male  with history of CAD, combined systolic and diastolic CHF, COPD who presents to the emergency room evaluation of altered mental status, slurred speech and expressive aphasia --  last known well was 11 PM last night before bed.  He awoke this morning at 10 AM when wife noted the patient seemed confused (at baseline is A/O x 3, but today is alert and oriented to self only) and had difficulty saying sentences.  Patient also complains of subjective right leg weakness today that was also present upon waking at 10 AM. Patient denies prior history of stroke.  Patient is not anticoagulated.  No fevers, chills, chest pain, shortness of breath, abdominal pain, vomiting, diarrhea, vision loss or facial droop.    Outside Historian(s)  (EMS, Significant Other, Family, Parent, Caregiver, Friend, Patent examiner, etc.)    Wife      Past Medical History:   Diagnosis Date    Arthritis     rheumatoid arthritis    Asthma     Barrett's esophagus     Blepharitis, unspecified     BPH (benign prostatic hyperplasia)     Cataract     Cataract Nuclear Sclerosis    COPD (chronic obstructive pulmonary disease) (CMS-HCC)     Coronary artery disease     Dysuria     intermittent    Hand injury     Heart disease     HL (hearing loss)     Hypertension     Joint pain     Osteoarthritis     Shoulder injury     Sleep apnea     does not wear CPAP    Spells     Stroke (CMS-HCC)     mini stroke May 2014, documented as CVA in 2012    Tear film insufficiency, unspecified        Patient Active Problem List   Diagnosis    Barrett's esophagus    Hypertension, benign    Diaphragmatic hernia    Sensorineural hearing loss    Cataract    Coronary artery disease involving native heart without angina pectoris    Hyperlipidemia    BPH (benign prostatic hyperplasia)    GERD (gastroesophageal reflux disease)    Periodic limb movement disorder (PLMD)    Asthma    COPD (chronic obstructive pulmonary disease) (CMS-HCC)    Tear film insufficiency    History of stroke    Aortic regurgitation    Seropositive rheumatoid arthritis (CMS-HCC)    Weight loss    Vertigo    Chigger bites    Transient alteration  of awareness    Dysphagia    Risk for falls    Advance care planning/counseling discussion    Nocturnal leg cramps    Aortic valve disorders    Routine health maintenance    Cervico-occipital neuralgia    Incomplete tear of right rotator cuff    Chronic combined systolic and diastolic congestive heart failure (CMS-HCC)    Iron deficiency anemia    Arthritis of right acromioclavicular joint    Phimosis    Wart of hand       Past Surgical History:   Procedure Laterality Date    CARDIAC CATHETERIZATION      CARPAL TUNNEL RELEASE      CHOLECYSTECTOMY      ESOPHAGOGASTRODUODENOSCOPY      HERNIA REPAIR      PR CATH PLACE/CORON ANGIO, IMG SUPER/INTERP,W LEFT HEART VENTRICULOGRAPHY N/A 08/03/2021    Procedure: Left Heart Catheterization;  Surgeon: Autumn Messing, MD;  Location: [facility] CATH;  Service: Cardiology    PR COLONOSCOPY FLX DX W/COLLJ SPEC WHEN PFRMD N/A 07/20/2018    Procedure: COLONOSCOPY, FLEXIBLE, PROXIMAL TO SPLENIC FLEXURE; DIAGNOSTIC, W/WO COLLECTION SPECIMEN BY BRUSH OR WASH;  Surgeon: Harle Battiest, MD;  Location: [facility];  Service: Gastroenterology    PR COLONOSCOPY FLX DX W/COLLJ SPEC WHEN PFRMD N/A 06/11/2021    Procedure: COLONOSCOPY, FLEXIBLE, PROXIMAL TO SPLENIC FLEXURE; DIAGNOSTIC, W/WO COLLECTION SPECIMEN BY BRUSH OR WASH;  Surgeon: Harle Battiest, MD;  Location: [facility]URES [facility];  Service: Gastroenterology    PR EDG ABLATE TUMOR POLYP/LESION W/DILATION& WIRE N/A 02/06/2015    Procedure: EGD, FLEXIBLE, TRANSORAL; WITH ABLATION OF TUMOR(S), POLYP(S), OR OTHER LESION(S) (INCLUDEDS PRE-AND POST-DILATION AND GUIDE WIRE PASSAGE, WHEN PERFORMED);  Surgeon: Elyn Aquas, MD;  Location: [facility]URES [facility];  Service: Gastroenterology    PR ESOPHAGOSCOPY FLEX TRANSORAL LESION ABLATION  10/11/2019    Procedure: ESOPHAGOSCOPY, FLEXIBLE, TRANSORAL; WITH ABLATION OF TUMOR(S), POLYP(S), OR OTHER LESION(S) (INCLUDES PRE- AND POST-DILATION AND GUIDE WIRE PASSAGE, WHEN PERFORMED);  Surgeon: Harle Battiest, MD;  Location: [facility]URES [facility];  Service: Gastroenterology    PR LARYNGOSCOPY,DIR,OP,EXC TUMR,LCL FLAP Midline 12/29/2013    Procedure: LARYNGOSCOPY, DIRECT OP, MICRO/TELESCOPE W/SUBMUC REMOVE NON-NEOPLAS LESION(S) VOC CORD;RECONS W/LOCAL FLAP;  Surgeon: Christene Lye, MD;  Location: MAIN OR [facility];  Service: ENT    PR UPPER GI ENDOSCOPY,BIOPSY N/A 10/13/2012    Procedure: UGI ENDOSCOPY; WITH BIOPSY, SINGLE OR MULTIPLE;  Surgeon: Elyn Aquas, MD;  Location: [facility] PROCEDURES [place] [facility];  Service: Gastroenterology    PR UPPER GI ENDOSCOPY,BIOPSY N/A 12/29/2013    Procedure: UGI ENDOSCOPY; WITH BIOPSY, SINGLE OR MULTIPLE;  Surgeon: Elyn Aquas, MD;  Location: MAIN OR [facility]  Service: Gastroenterology    PR UPPER GI ENDOSCOPY,BIOPSY N/A 08/07/2015    Procedure: UGI ENDOSCOPY; WITH BIOPSY, SINGLE OR MULTIPLE;  Surgeon: Harle Battiest, MD;  Location: [facility]URES [facility];  Service: Gastroenterology    PR UPPER GI ENDOSCOPY,BIOPSY N/A 07/22/2016    Procedure: UGI ENDOSCOPY; WITH BIOPSY, SINGLE OR MULTIPLE;  Surgeon: Harle Battiest, MD;  Location: [facility]URES [facility];  Service: Gastroenterology    PR UPPER GI ENDOSCOPY,BIOPSY N/A 07/21/2017    Procedure: UGI ENDOSCOPY; WITH BIOPSY, SINGLE OR MULTIPLE;  Surgeon: Harle Battiest, MD;  Location: [facility]URES [facility];  Service: Gastroenterology    PR UPPER GI ENDOSCOPY,BIOPSY N/A 07/20/2018    Procedure: UGI ENDOSCOPY; WITH BIOPSY, SINGLE OR MULTIPLE;  Surgeon: Caryl Pina  Enrigue Catena, MD;  Location: [facility]EDURES [facility];  Service: Gastroenterology    PR UPPER GI ENDOSCOPY,BIOPSY N/A 08/09/2019    Procedure: UGI ENDOSCOPY; WITH BIOPSY, SINGLE OR MULTIPLE;  Surgeon: Harle Battiest, MD;  Location: [facility];  Service: Gastroenterology    PR UPPER GI ENDOSCOPY,BIOPSY N/A 01/10/2020    Procedure: UGI ENDOSCOPY; WITH BIOPSY, SINGLE OR MULTIPLE;  Surgeon: Harle Battiest, MD;  Location: [facility];  Service: Gastroenterology    PR UPPER GI ENDOSCOPY,BIOPSY N/A 07/10/2020    Procedure: UGI ENDOSCOPY; WITH BIOPSY, SINGLE OR MULTIPLE;  Surgeon: Harle Battiest, MD;  Location: [facility];  Service: Gastroenterology    PR UPPER GI ENDOSCOPY,BIOPSY N/A 06/11/2021    Procedure: UGI ENDOSCOPY; WITH BIOPSY, SINGLE OR MULTIPLE;  Surgeon: Harle Battiest, MD;  Location: [facility]URES [facility];  Service: Gastroenterology    SHOULDER SURGERY         No current facility-administered medications for this encounter.    Current Outpatient Medications:     albuterol HFA 90 mcg/actuation inhaler, Inhale 2 puffs every four (4) hours as needed for wheezing., Disp: 9 g, Rfl: 3    aspirin (ECOTRIN) 81 MG tablet, Take 1 tablet (81 mg total) by mouth daily., Disp: , Rfl:     atorvastatin (LIPITOR) 40 MG tablet, Take 1 tablet (40 mg total) by mouth daily., Disp: 90 tablet, Rfl: 3    carvediloL (COREG) 6.25 MG tablet, Take 1 tablet (6.25 mg total) by mouth two (2) times a day., Disp: 90 tablet, Rfl: 3    diclofenac sodium (VOLTAREN) 1 % gel, Apply 2 g topically four (4) times a day., Disp: 100 g, Rfl: 0    empagliflozin (JARDIANCE) 10 mg tablet, Take 1 tablet (10 mg total) by mouth daily., Disp: , Rfl:     finasteride (PROSCAR) 5 mg tablet, Take 1 tablet (5 mg total) by mouth daily., Disp: 90 tablet, Rfl: 3    fluticasone propionate (FLONASE) 50 mcg/actuation nasal spray, 1 spray into each nostril two (2) times a day for 14 days. (Patient not taking: Reported on 06/04/2022), Disp: 16 g, Rfl: 3    folic acid (FOLVITE) 1 MG tablet, Take 1 tablet (1,000 mcg total) by mouth daily., Disp: 90 tablet, Rfl: 3    furosemide (LASIX) 20 MG tablet, TAKE AS DIRECTED AS NEEDED (Patient not taking: Reported on 06/04/2022), Disp: 60 tablet, Rfl: 0    gabapentin (NEURONTIN) 100 MG capsule, TAKE 1 CAPSULE BY MOUTH THREE TIMES DAILY, Disp: 270 capsule, Rfl: 3    losartan (COZAAR) 25 MG tablet, Take 1 tablet (25 mg total) by mouth daily. (Patient not taking: Reported on 06/04/2022), Disp: 90 tablet, Rfl: 3    methotrexate sodium (METHOTREXATE, CONTAINS PRESERVATIVES,) 25 mg/mL injection solution, Inject 1 mL (25 mg total) under the skin once a week. (Patient taking differently: Inject 1 mL (25 mg total) under the skin once a week. Saturdays), Disp: 12 mL, Rfl: 1    mirtazapine (REMERON) 7.5 MG tablet, Take 1 tablet (7.5 mg total) by mouth nightly., Disp: 90 tablet, Rfl: 3    nitroglycerin (NITROSTAT) 0.4 MG SL tablet, Place 1 tablet (0.4 mg total) under the tongue every five (5) minutes as needed for chest pain. Maximum of 3 doses in 15 minutes., Disp: 25 tablet, Rfl: 1    omeprazole (PRILOSEC) 40 MG capsule, Take 1 capsule (40 mg total) by mouth daily. (Patient taking differently: Take 20 mg by mouth daily.), Disp:  90 capsule, Rfl: 3    oxybutynin (DITROPAN-XL) 5 MG 24 hr tablet, Take 1 tablet (5 mg total) by mouth daily., Disp: 90 tablet, Rfl: 3    sulfaSALAzine (AZULFIDINE) 500 MG EC tablet, Take 1 tablet (500 mg total) by mouth Three (3) times a day., Disp: 270 tablet, Rfl: 3    tamsulosin (FLOMAX) 0.4 mg capsule, Take 1 capsule (0.4 mg total) by mouth daily., Disp: 90 capsule, Rfl: 3    umeclidinium-vilanteroL (ANORO ELLIPTA) 62.5-25 mcg/actuation inhaler, Inhale 1 puff daily., Disp: 60 each, Rfl: 2    Allergies  Shellfish containing products, Antihistamines - alkylamine, Loratadine-pseudoephedrine, and Codeine    Family History   Problem Relation Age of Onset    Cataracts Sister     Hypertension Sister     Cancer Sister     Diabetes Sister Prostate cancer Paternal Grandfather     Kidney disease Mother     Heart disease Mother     Emphysema Father     Hypertension Father     Heart disease Brother     Hypertension Brother     Heart attack Brother     Diabetes Brother     Diabetes Sister     Drug abuse Sister     Allergies Sister     Heart attack Brother     Diabetes Brother     Amblyopia Neg Hx     Blindness Neg Hx     Glaucoma Neg Hx     Macular degeneration Neg Hx     Retinal detachment Neg Hx     Strabismus Neg Hx     Stroke Neg Hx     Thyroid disease Neg Hx        Social History  Social History     Tobacco Use    Smoking status: Former     Current packs/day: 0.00     Average packs/day: 2.0 packs/day for 40.0 years (80.0 ttl pk-yrs)     Types: Cigarettes     Start date: 05/20/1942     Quit date: 05/20/1982     Years since quitting: 40.0    Smokeless tobacco: Never    Tobacco comments:     started at 77yo   Vaping Use    Vaping Use: Never used   Substance Use Topics    Alcohol use: No     Alcohol/week: 0.0 standard drinks of alcohol    Drug use: No     Comment: past use           Physical Exam     ED Triage Vitals   Enc Vitals Group      BP 06/11/22 1854 161/82      Heart Rate 06/11/22 1854 106      SpO2 Pulse --       Resp 06/11/22 1854 27      Temp 06/11/22 1902 36.4 ??C (97.5 ??F)      Temp Source 06/11/22 1902 Oral      SpO2 06/11/22 1854 93 %      Weight --       Height --       Head Circumference --       Peak Flow --       Pain Score --       Pain Loc --       Pain Edu? --       Excl. in GC? --          Constitutional:  Appears confused but nontoxic.  HEENT: [place]/AT. EOMI. Normal conjunctiva. Dry MM. No stridor.  Cardiovascular: RRR without m/g/r. 2+ radial pulses bilaterally.   Respiratory: Normal respiratory effort. Lungs CTAB.   Gastrointestinal: Abdomen soft, nt, nd without rebound or guarding.  Musculoskeletal: Extremities warm and well perfused. No long bone deformities.  Neurologic:  He is awake, alert and oriented to self only.  PERRL.  EOMI. No nystagmus.  Visual fields full. CN II-XII intact. No facial droop. Dysarthria is present but no aphasia.  He has symmetric 5/5 strength and sensation of bilateral upper and lower extremities without focal deficits. No hemiextinction.   Skin: Skin is warm, dry. No rash noted.  Psychiatric: Mood and affect are normal. Speech and behavior are normal.        Labs     Results for orders placed or performed during the hospital encounter of 06/11/22   CBC   Result Value Ref Range    WBC 6.5 3.6 - 11.2 10*9/L    RBC 4.47 4.26 - 5.60 10*12/L    HGB 13.5 12.9 - 16.5 g/dL    HCT 81.1 91.4 - 78.2 %    MCV 90.3 77.6 - 95.7 fL    MCH 30.2 25.9 - 32.4 pg    MCHC 33.5 32.0 - 36.0 g/dL    RDW 95.6 (H) 21.3 - 15.2 %    MPV 8.0 6.8 - 10.7 fL    Platelet 280 150 - 450 10*9/L   Comprehensive Metabolic Panel   Result Value Ref Range    Sodium 140 135 - 145 mmol/L    Potassium      Chloride 105 98 - 107 mmol/L    CO2 28.0 20.0 - 31.0 mmol/L    Anion Gap 7 5 - 14 mmol/L    BUN 14 9 - 23 mg/dL    Creatinine 0.86 5.78 - 1.18 mg/dL    BUN/Creatinine Ratio 15     eGFR CKD-EPI (2021) Male 83 >=60 mL/min/1.33m2    Glucose 85 70 - 179 mg/dL    Calcium 46.9 (H) 8.7 - 10.4 mg/dL    Albumin 4.0 3.4 - 5.0 g/dL    Total Protein 7.6 5.7 - 8.2 g/dL    Total Bilirubin 0.6 0.3 - 1.2 mg/dL    AST      ALT 13 10 - 49 U/L    Alkaline Phosphatase 33 (L) 46 - 116 U/L   PT-INR   Result Value Ref Range    PT 11.7 9.9 - 12.6 sec    INR 1.05    APTT   Result Value Ref Range    APTT 27.0 24.8 - 38.4 sec    Heparin Correlation 0.2    hsTroponin I (single, no delta)   Result Value Ref Range    hsTroponin I 43 <=53 ng/L   ECG 12 Lead   Result Value Ref Range    EKG Systolic BP  mmHg    EKG Diastolic BP  mmHg    EKG Ventricular Rate 88 BPM    EKG Atrial Rate 88 BPM    EKG P-R Interval 164 ms    EKG QRS Duration 80 ms    EKG Q-T Interval 362 ms    EKG QTC Calculation 438 ms    EKG Calculated P Axis 28 degrees    EKG Calculated R Axis  degrees    EKG Calculated T Axis 133 degrees    QTC Fredericia 411 ms   POCT Glucose  Result Value Ref Range    Glucose, POC 94 70 - 179 mg/dL        Radiology     XR Chest 2 views   Final Result   No radiographic evidence of acute cardiopulmonary pathology.         CT Head Wo Contrast   Final Result   No evidence of acute intracranial pathology.                      Jola Critzer, Dustin Folks, MD  Resident  06/12/22 [phone]

## 2022-06-12 NOTE — Unmapped (Signed)
STROKE ALERT: 45yr M. RLE, expressive aphasia, and unsteady gair.LKN 2300 last night. ROOM 2. ETA NOW.

## 2022-06-13 MED ORDER — MIRTAZAPINE 7.5 MG TABLET
ORAL_TABLET | Freq: Every evening | ORAL | 0 refills | 0 days
Start: 2022-06-13 — End: ?

## 2022-06-14 DIAGNOSIS — M059 Rheumatoid arthritis with rheumatoid factor, unspecified: Principal | ICD-10-CM

## 2022-06-14 DIAGNOSIS — Z79631 Methotrexate, long term, current use: Principal | ICD-10-CM

## 2022-06-14 MED ORDER — MIRTAZAPINE 7.5 MG TABLET
ORAL_TABLET | Freq: Every evening | ORAL | 0 refills | 90 days | Status: CP
Start: 2022-06-14 — End: ?

## 2022-06-14 MED ORDER — METHOTREXATE SODIUM (CONTAINS PRESERVATIVES) 25 MG/ML INJECTION SOLUTION
SUBCUTANEOUS | 1 refills | 84.00000 days
Start: 2022-06-14 — End: 2022-12-11

## 2022-06-14 NOTE — Unmapped (Signed)
-----   Message from [facility] sent at 06/13/2022  8:14 AM EST -----  Regarding: RE: ED follow-up  Left vm message, well text and my chart message to call to schedule appointment. Thanks     ----- Message -----  From: [name], MD  Sent: 06/13/2022  12:00 AM EST  To: #  Subject: ED follow-up                                     Hi Team,    Would you mind reaching out to this patient for a follow-up for ED evaluation? Thanks!    Hillary

## 2022-06-14 NOTE — Unmapped (Signed)
The PAC has received an incoming call requesting medication refill/clarification/question:    Caller: Estuardo Sieloff  Best callback number: [phone]    Type of request (refill, clarification, question): Refill  Name of medication: methotrexate sodium (METHOTREXATE, CONTAINS PRESERVATIVES,) 25 mg/mL injection solution   Is there a preferred amount requested (e.g. 30 pills, 3 months worth - if no leave blank):   Desired pharmacy:   [facility] [address]), [place] - [address]  [address]  [place] ([place]) [place] [address]  Phone: [phone] Fax: [phone]  Does caller request a callback from a nurse to discuss?: Pt's wife is calling about medication needing to be refilled, medication need to be sent to [facility] pharmacy.

## 2022-06-14 NOTE — Unmapped (Signed)
Attempted to contact patient left voice message , patient was previously sent well text message .  Pending response

## 2022-06-15 MED ORDER — METHOTREXATE SODIUM (CONTAINS PRESERVATIVES) 25 MG/ML INJECTION SOLUTION
SUBCUTANEOUS | 3 refills | 84 days | Status: CP
Start: 2022-06-15 — End: 2023-06-15

## 2022-06-15 NOTE — Unmapped (Signed)
Methotrexate refill  Last Visit Date: 01/29/2022  Next Visit Date: 08/01/2022    Lab Results   Component Value Date    ALT 13 06/11/2022    AST 25 06/11/2022    ALBUMIN 4.0 06/11/2022    CREATININE 0.94 06/11/2022     Lab Results   Component Value Date    WBC 6.5 06/11/2022    HGB 13.5 06/11/2022    HCT 40.3 06/11/2022    PLT 280 06/11/2022     Lab Results   Component Value Date    NEUTROPCT 57.0 05/27/2022    LYMPHOPCT 26.1 05/27/2022    MONOPCT 13.2 05/27/2022    EOSPCT 2.7 05/27/2022    BASOPCT 1.0 05/27/2022

## 2022-06-17 NOTE — Unmapped (Signed)
LVM for patient to call and schedule appt for follow up.    Follow-up Appointment Request  Provider: Sung Amabile (if 1/26 at 1:15om available), or Fatemeh Sadeghifar (if 1/26 at 3:45pm available) or Alan Ripper Books (if 1/25 at 3:05pm available)  Reason/Diagnosis: ED follow up for AMS, transient   Priority level: Next available, to be seen this week if able, as above

## 2022-06-20 DIAGNOSIS — Z79631 Methotrexate, long term, current use: Principal | ICD-10-CM

## 2022-06-20 DIAGNOSIS — M059 Rheumatoid arthritis with rheumatoid factor, unspecified: Principal | ICD-10-CM

## 2022-06-20 MED ORDER — METHOTREXATE SODIUM (CONTAINS PRESERVATIVES) 25 MG/ML INJECTION SOLUTION
SUBCUTANEOUS | 1 refills | 84 days
Start: 2022-06-20 — End: 2022-12-17

## 2022-06-20 NOTE — Unmapped (Signed)
The [facility] has made a second and final attempt to reach this patient to refill the following medication:Methotrexate.      We have left voicemails on the following phone numbers: [phone] and have sent a MyChart message.    Dates contacted: 05/16/22 and 06/20/22  Last scheduled delivery: 03/05/22    The patient may be at risk of non-compliance with this medication. The patient should call the [facility] at [phone]  Option 4, then Option 2 (all other specialty patients) to refill medication.    Unk Lightning   [facility] [address] [facility] Specialty Technician

## 2022-06-21 MED ORDER — METHOTREXATE SODIUM (CONTAINS PRESERVATIVES) 25 MG/ML INJECTION SOLUTION
SUBCUTANEOUS | 1 refills | 84 days
Start: 2022-06-21 — End: 2022-12-18

## 2022-06-26 ENCOUNTER — Ambulatory Visit
Admission: RE | Admit: 2022-06-26 | Discharge: 2022-06-26 | Disposition: A | Discharge status: Home / Self Care | Payer: 59 | Source: Ambulatory Visit | Attending: Physician Assistant | Admitting: Physician Assistant

## 2022-06-26 ENCOUNTER — Ambulatory Visit (INDEPENDENT_AMBULATORY_CARE_PROVIDER_SITE_OTHER): Payer: 59

## 2022-06-26 VITALS — BP 160/75 | HR 75 | Temp 98.3°F | Resp 18

## 2022-06-26 DIAGNOSIS — Z23 Encounter for immunization: Secondary | ICD-10-CM

## 2022-06-26 DIAGNOSIS — S61432A Puncture wound without foreign body of left hand, initial encounter: Secondary | ICD-10-CM

## 2022-06-26 MED ORDER — TETANUS-DIPHTH-ACELL PERTUSSIS 5-2.5-18.5 LF-MCG/0.5 IM SUSY
0.5000 mL | PREFILLED_SYRINGE | Freq: Once | INTRAMUSCULAR | Status: AC
  Administered 2022-06-26: 0.5 mL via INTRAMUSCULAR

## 2022-06-26 MED ORDER — DOXYCYCLINE HYCLATE 100 MG PO CAPS: 100.0000 mg | ORAL_CAPSULE | Freq: Two times a day (BID) | ORAL | 0 refills | Status: DC

## 2022-06-26 NOTE — ED Triage Notes (Signed)
Pt presents with left hand discoloration and swelling from a staple yesterday; pt is unsure if staple is still in hand.

## 2022-06-26 NOTE — ED Provider Notes (Signed)
[facility]    CSN: [id number] Arrival date & time: 06/26/22  1648      History   Chief Complaint Chief Complaint  Patient presents with   Hand Injury    HPI Dennis [name] is a 77 y.o. male.   Patient here today for evaluation of hand discoloration and swelling after he accidentally was punctured by a staple in the left hand yesterday.  He denies any numbness or tingling.  He is unsure when his last tetanus vaccine was administered.  The history is provided by the patient.  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 Authorizing Provider  doxycycline (VIBRAMYCIN) 100 MG capsule Take 1 capsule (100 mg total) by mouth 2 (two) times daily. 06/26/22  Yes Francene Finders, PA-C  atorvastatin (LIPITOR) 40 MG tablet Take 1 tablet by mouth daily. 07/12/20   [provider]  carvedilol (COREG) 3.125 MG tablet Take by mouth.    [provider]  clonazePAM (KLONOPIN) 0.5 MG tablet Take by mouth.    [provider]  clopidogrel (PLAVIX) 75 MG tablet Take by mouth.    [provider]  finasteride (PROSCAR) 5 MG tablet Take by mouth.    [provider]  folic acid (FOLVITE) 1 MG tablet Take 1 mg by mouth daily. 08/02/20   [provider]  gabapentin (NEURONTIN) 100 MG capsule Take 100 mg by mouth 3 (three) times daily. 11/08/20   [provider]  losartan (COZAAR) 25 MG tablet Take 1 tablet by mouth daily. 08/15/20   [provider]  mirtazapine (REMERON) 15 MG tablet TAKE 1/2 (ONE-HALF) TABLET BY MOUTH AT BEDTIME FOR ANXIETY 01/06/20   [provider]  omeprazole (PRILOSEC) 20 MG capsule Take 20 mg by mouth 2 (two) times daily. 09/24/20   [provider]  sulfaSALAzine (AZULFIDINE) 500 MG tablet Take by mouth. 10/25/20 01/23/21  [provider]  tamsulosin (FLOMAX) 0.4 MG CAPS capsule Take by mouth.    [provider]  traMADol (ULTRAM) 50 MG tablet Take by mouth.    [provider]  triamcinolone cream (KENALOG) 0.1 % Apply 1 application topically 2 (two) times daily. 11/24/20   Wieters, Elesa Hacker, PA-C    Family History History reviewed. No pertinent family history.  Social History Social History   Tobacco Use   Smoking status: Former   Smokeless tobacco: Never  Scientific laboratory technician Use: Never used  Substance Use Topics   Alcohol use: No   Drug use: No     Allergies   Codeine and Shellfish allergy   Review of Systems Review of Systems  Constitutional:  Negative for chills and fever.  Eyes:  Negative for discharge and redness.  Skin:  Positive for color change and wound.  Neurological:  Negative for numbness.     Physical Exam Triage Vital Signs ED Triage Vitals  Enc Vitals Group  BP 06/26/22 1817 (!) 160/75     Pulse Rate 06/26/22 1817 75     Resp 06/26/22 1817 18     Temp 06/26/22 1817 98.3 F (36.8 C)     Temp Source 06/26/22 1817 Oral     SpO2 06/26/22 1817 94 %     Weight --      Height --      Head Circumference --      Peak Flow --      Pain Score 06/26/22 1820 5     Pain Loc --      Pain Edu? --      Excl. in [place]? --    No data found.  Updated Vital Signs BP (!) 160/75 (BP Location: Right Arm)   Pulse 75   Temp 98.3 F (36.8 C) (Oral)   Resp 18   SpO2 94%      Physical Exam Vitals and nursing note reviewed.  Constitutional:      General: He is not in acute distress.    Appearance: Normal appearance. He is not  ill-appearing.  HENT:     Head: Normocephalic and atraumatic.  Eyes:     Conjunctiva/sclera: Conjunctivae normal.  Cardiovascular:     Rate and Rhythm: Normal rate.  Pulmonary:     Effort: Pulmonary effort is normal. No respiratory distress.  Skin:    Comments: Approx 1 cm crusted wound to left dorsal hand between 1st and 2nd metacarpal, surrounding erythema and swelling noted  Neurological:     Mental Status: He is alert.     Comments: Gross sensation intact to left fingers distally  Psychiatric:        Mood and Affect: Mood normal.        Behavior: Behavior normal.        Thought Content: Thought content normal.      UC Treatments / Results  Labs (all labs ordered are listed, but only abnormal results are displayed) Labs Reviewed - No data to display  EKG   Radiology DG Hand Complete Left  Result Date: 06/26/2022 CLINICAL DATA:  Injury.  Possible staple in hand. EXAM: LEFT HAND - COMPLETE 3+ VIEW COMPARISON:  Left index finger radiographs 09/15/2014 FINDINGS: There is again diffuse decreased bone mineralization. 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 There again 3 metallic densities within the web space between the thumb and index finger, unchanged. Moderate second through fifth DIP and mild second through fifth PIP joint space narrowing. Severe thumb carpometacarpal joint space narrowing, subchondral sclerosis, and peripheral osteophytosis. Mild triscaphe joint space narrowing. No acute fracture or dislocation. No new metallic density is seen to indicate a staple in the hand. IMPRESSION: 1. No new metallic density is seen to indicate a staple in the hand. Unchanged 3 metallic densities within the webspace between the thumb and index finger. 2. 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 3. No acute fracture. Electronically Signed   By: Yvonne Kendall M.D.   On: 06/26/2022 18:43     Procedures Procedures (including critical care time)  Medications Ordered in UC Medications  Tdap (BOOSTRIX) injection 0.5 mL (0.5 mLs Intramuscular Given 06/26/22 1910)    Initial Impression / Assessment and Plan / UC Course  I have reviewed the triage vital signs and the nursing notes.  Pertinent labs & imaging results that were available during my care of the patient were reviewed by me and considered  in my medical decision making (see chart for details).    Xray without apparent retained FB or fracture. Doxycycline prescribed for antibacterial coverage. Tetanus vaccination updated in office. Encouraged follow up if no gradual improvement or with any further concerns.    Final Clinical Impressions(s) / UC Diagnoses   Final diagnoses:  Puncture wound of left hand without foreign body, initial encounter   Discharge Instructions   None    ED Prescriptions     Medication Sig Dispense Auth. Provider   doxycycline (VIBRAMYCIN) 100 MG capsule Take 1 capsule (100 mg total) by mouth 2 (two) times daily. 20 capsule Francene Finders, PA-C      PDMP not reviewed this encounter.   Francene Finders, PA-C 06/26/22 [phone]

## 2022-07-01 DIAGNOSIS — K2270 Barrett's esophagus without dysplasia: Principal | ICD-10-CM

## 2022-07-01 NOTE — Unmapped (Signed)
Placing order per Dr. Bryson Dames plan of care for EGD to follow up on Barrett's esophagus s/p ablation. Last January 2023 and due currently.

## 2022-07-22 NOTE — Unmapped (Signed)
EGD  Procedure #1     Procedure #2   [id number]  MRN   [facility]   Endoscopist   TRUE  Is the patient's health insurance [employer], [place] [facility]), or [employer]?     Urgent procedure     Are you pregnant?     Are you in the process of scheduling or awaiting results of a heart ultrasound, stress test, or catheterization to evaluate new or worsening chest pain, dizziness, or shortness of breath?     Do you take: Plavix (clopidogrel), Coumadin (warfarin), Lovenox (enoxaparin), Pradaxa (dabigatran), Effient (prasugrel), Xarelto (rivaroxaban), Eliquis (apixaban), Pletal (cilostazol), or Brilinta (ticagrelor)?          Which of the above medications are you taking?          What is the name of the medical practice that manages this medication?          What is the name of the medical provider who manages this medication?     Do you have hemophilia, von Willebrand disease, or low platelets?     Do you have a pacemaker or implanted cardiac defibrillator?     Has a [facility] provider specified the location(s)?     Which location(s) did the [facility] provider specify?        [facility]        [place]        [facility]        [facility]     Is procedure indication for variceal banding (this does NOT include variceal screening)?     Have you had a heart attack, stroke or heart stent placement within the past 6 months?     Month of event     Year of event (ONLY ENTER LAST 2 DIGITS)        5  Height (feet)   4  Height (inches)   135  Weight (pounds)   23.2  BMI          Did the ordering provider specify a bowel prep?          What bowel prep was specified?     Do you have chronic kidney disease?     Do you have chronic constipation or have you had poor quality bowel preps for past colonoscopies?     Do you have Crohn's disease or ulcerative colitis?     Have you had weight loss surgery?          When you walk around your house or grocery store, do you have to stop and rest due to shortness of breath, chest pain, or light-headedness?     Do you ever use supplemental oxygen?     Have you been hospitalized for cirrhosis of the liver or heart failure in the last 12 months?     Have you been treated for mouth or throat cancer with radiation or surgery?     Have you been told that it is difficult for doctors to insert a breathing tube in you during anesthesia?     Have you had a heart or lung transplant?          Are you on dialysis?     Do you have cirrhosis of the liver?     Do you have myasthenia gravis?     Is the patient a prisoner?          Have you been diagnosed with sleep apnea or do you wear  a CPAP machine at night?     Are you younger than 30?     Have you previously received propofol sedation administered by an anesthesiologist for a GI procedure?     Do you drink an average of more than 3 drinks of alcohol per day?     Do you regularly take suboxone or any prescription medications for chronic pain?     Do you regularly take Ativan, Klonopin, Xanax, Valium, lorazepam, clonazepam, alprazolam, or diazepam?     Have you previously had difficulty with sedation during a GI procedure?     Have you been diagnosed with PTSD?     Are you allergic to fentanyl or midazolam (Versed)?     Do you take medications for HIV?   ################# ## ###################################################################################################################   MRN:          [id number]   Anticoag Review:  No   Nurse Triage:  No   GI Clinic Consult:  No   Procedure(s):  EGD     0   [facility]     [place]   Preferred  [facility]   Endoscopist:  SHAHEEN    Urgent:            No   Prep:                                 ################# ## ###################################################################################################################
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
## 2022-07-26 NOTE — Unmapped (Addendum)
Normal exam today without evidence of fluid overload.    Wt Readings from Last 6 Encounters:   07/29/22 64 kg (141 lb 1.6 oz)   06/11/22 64.4 kg (142 lb)   05/27/22 64.8 kg (142 lb 12.8 oz)   05/03/22 63.5 kg (140 lb 1.6 oz)   04/26/22 60.3 kg (133 lb)   04/25/22 60.3 kg (133 lb)
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
## 2022-07-26 NOTE — Unmapped (Addendum)
06/11/22 ER Visit, summary: 77 y.o.  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 providers found to consult for RLE weakness, unsteady gait, speech abnormality.     # AMS, subjective RLE weakness, gait instability  LKN 06/10/2022 23:00. NIHSS 2. Woke with subjective RLE weakness, gait instability, and speech abnormalities c/f expressive aphasia. Wife clarifies that this was more consistent with dysarthria, denies word-finding difficulty or word salad. On exam patient is slightly disoriented (September), with mild dysarthria, but full strength in all muscle groups, no sensory deficits, cranial nerves intact, and no other evidence of clear focal deficits. Requires some prompting with commands in setting of baseline sensorineural hearing loss and does not have home hearing aids with him.      Discussed with wife regarding patient not a candidate for tPA d/t out of time window, not a candidate for LVO evaluation or consideration of thrombectomy given clinical presentation and low NIHSS not consistent with LVO.     Etiology remains unclear at this time. Reassured by nonfocal exam and lack of cortical signs. NCHCT without acute intracranial abnormality despite Sx onset approx 19 hours prior is not definitive but is also reassuring. Low suspicion for acute ischemic process, and patient not a candidate for acute stroke-directed pharmacotherapy. Takes ASA 81mg  and atorvastatin 40mg  at home.      MRI unlikely to change management. If toxic-metabolic workup remains unrevealing, an MRI may theoretically demonstrate evidence of ischemic changes, prompting neck vessel imaging and possible consideration of CEA if hemodynamically unstable plaque is found - however there is currently no clear clinical indicator for this as a likely etiology and it is unclear if patient would be a good candidate for such a procedure.         Recommendations   - Continue home aspirin and statin  - Pursue workup for potential toxic-metabolic contributors in the immunocompromised patient. Appropriate workup may include CBC, CMP, BCx, UA/UCx, CXR, lactate, ammonia, per primary team's index of suspicion  - No indication for MRI at this time as findings unlikely to change management - may consider if other diagnostic workup remains noncontributory     Today: Back to his active self.  Gait is stable, maybe favors left leg a bit, strength in his legs is 4/5 symmetrical.

## 2022-07-29 ENCOUNTER — Ambulatory Visit: Admit: 2022-07-29 | Discharge: 2022-07-30 | Payer: MEDICARE

## 2022-07-29 DIAGNOSIS — I5042 Chronic combined systolic (congestive) and diastolic (congestive) heart failure: Principal | ICD-10-CM

## 2022-07-29 DIAGNOSIS — I2510 Atherosclerotic heart disease of native coronary artery without angina pectoris: Principal | ICD-10-CM

## 2022-07-29 DIAGNOSIS — R29898 Other symptoms and signs involving the musculoskeletal system: Principal | ICD-10-CM

## 2022-07-29 DIAGNOSIS — M059 Rheumatoid arthritis with rheumatoid factor, unspecified: Principal | ICD-10-CM

## 2022-07-29 DIAGNOSIS — Z79631 Methotrexate, long term, current use: Principal | ICD-10-CM

## 2022-07-29 DIAGNOSIS — J449 Chronic obstructive pulmonary disease, unspecified: Principal | ICD-10-CM

## 2022-07-29 MED ORDER — OMEPRAZOLE 20 MG CAPSULE,DELAYED RELEASE
ORAL_CAPSULE | Freq: Every day | ORAL | 6 refills | 30 days | Status: CP
Start: 2022-07-29 — End: 2023-07-29

## 2022-07-29 MED ORDER — METHOTREXATE SODIUM (CONTAINS PRESERVATIVES) 25 MG/ML INJECTION SOLUTION
SUBCUTANEOUS | 3 refills | 84 days | Status: CP
Start: 2022-07-29 — End: 2023-07-29

## 2022-07-29 NOTE — Unmapped (Signed)
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 Discussed trial of voltaren gel for this. Otherwise will continue current regimen, reviewed labs as above, needs LFTs. Have seemingly avoided biologics in light of his throat cancer.  - Continue MTX 25mg  SQ qweekly + FA daily  - Continue SSZ 500mg  TID  - AST, ALT  - Update hand films  - Start Voltaren gel
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
## 2022-08-01 ENCOUNTER — Ambulatory Visit
Admit: 2022-08-01 | Discharge: 2022-08-02 | Payer: MEDICARE | Attending: Student in an Organized Health Care Education/Training Program | Primary: Student in an Organized Health Care Education/Training Program

## 2022-08-01 DIAGNOSIS — M1990 Unspecified osteoarthritis, unspecified site: Principal | ICD-10-CM

## 2022-08-01 MED ORDER — DISPOSABLE GLOVES
Freq: Every day | 1 refills | 0 days | Status: CP
Start: 2022-08-01 — End: ?

## 2022-08-01 MED ORDER — NEOSPORIN (NEO-BAC-POLYM) 3.5 MG-400 UNIT-5,000 UNIT/GRAM TOP OINTMENT
Freq: Two times a day (BID) | TOPICAL | 0 refills | 0 days | Status: CP
Start: 2022-08-01 — End: ?
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
## 2022-08-04 MED ORDER — MIRTAZAPINE 7.5 MG TABLET
ORAL_TABLET | Freq: Every evening | ORAL | 0 refills | 0 days
Start: 2022-08-04 — End: ?

## 2022-08-05 MED ORDER — MIRTAZAPINE 7.5 MG TABLET
ORAL_TABLET | Freq: Every evening | ORAL | 0 refills | 90 days | Status: CP
Start: 2022-08-05 — End: ?

## 2022-08-06 NOTE — Unmapped (Signed)
Called and spoke with spouse   Symptoms began denied fever  Patient has a sores on the left inside of the lower lip   No drainage   Patient denies trauma   Patient stated are is not worsening just not healing   mich  Same Day Clinic Referral Intake      Patient Name: Ralph Santos    Patient MRN: [id number]    Location of Patient: Home    Source of Referral: Patient Call (Self-referral)    Referring Provider Name, Contact Number (if applicable):     Reason for Referral: non healing sore in mouth     Procedure Anticipated?  If yes, select procedure:  ?    Diagnostic Testing Anticipated?  If yes, select test(s):  ?    Vitals:  n/a    Additional Info: None    Will patient be seen on the day of referral in Same Day Clinic? No, added to next day's schedule

## 2022-08-06 NOTE — Unmapped (Signed)
The PAC has received an incoming clinical call:    Caller name: Balam, Mercedes    Best callback number: [phone]   Relationship to Patient: pt  Describe the reason for the call: Patient has had mouth sores for about a week and a half. His pharmacy suggested that he call his PCP and see if PCP would prescribe Kenalog for him?send to [facility]

## 2022-08-07 ENCOUNTER — Ambulatory Visit: Admit: 2022-08-07 | Discharge: 2022-08-08 | Payer: MEDICARE

## 2022-08-07 DIAGNOSIS — K121 Other forms of stomatitis: Principal | ICD-10-CM

## 2022-08-07 MED ORDER — CLOBETASOL 0.05 % TOPICAL GEL
Freq: Two times a day (BID) | TOPICAL | 0 refills | 0 days | Status: CP
Start: 2022-08-07 — End: ?

## 2022-08-07 MED ORDER — FOLIC ACID 1 MG TABLET
ORAL_TABLET | Freq: Every day | ORAL | 0 refills | 30 days | Status: CP
Start: 2022-08-07 — End: 2022-09-06

## 2022-08-07 MED ORDER — DIPHENHYD 25 MG-LIDO 200 MG-MAG,AL 400 MG-SIMETH 40 MG/30 ML MOUTHWASH
Freq: Four times a day (QID) | OROMUCOSAL | 0 refills | 6 days | Status: CP | PRN
Start: 2022-08-07 — End: ?

## 2022-08-07 NOTE — Unmapped (Signed)
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    Oral ulcer  Ddx includes viral cause with HSV vs aphthous ulcer possibly related to methotrexate use. We will swab for HSV in clinic today and start valacyclovir if positive. Considered malignancy, but in discussing with patient, he has not had a history of throat cancer. 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   -HSV swab today  -Increase folic acid to 5mg  daily  -Will speak with rheumatology to discuss adding folinic acid  -Lidocaine-benadryl-aluminum-magnesium mouthwash   -Clobetasol 0.05% gel BID     Follow up as scheduled or sooner as needed.    Patient was seen and discussed with Dr. Freida Busman who is in agreement with the assessment and plan as outlined above.       Subjective    Problem List:  Patient Active Problem List   Diagnosis    Barrett's esophagus    Hypertension, benign    Diaphragmatic hernia    Sensorineural hearing loss    Cataract    Coronary artery disease involving native heart without angina pectoris    Hyperlipidemia    BPH (benign prostatic hyperplasia)    GERD (gastroesophageal reflux disease)    Periodic limb movement disorder (PLMD)    Asthma    COPD (chronic obstructive pulmonary disease) (CMS-HCC)    Tear film insufficiency    History of stroke    Aortic regurgitation    Seropositive rheumatoid arthritis (CMS-HCC)    Weight loss    Vertigo    Chigger bites    Transient alteration of awareness    Dysphagia    Risk for falls    Advance care planning/counseling discussion    Nocturnal leg cramps    Aortic valve disorders    Routine health maintenance    Cervico-occipital neuralgia    Incomplete tear of right rotator cuff    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 year old male with the above problem list who presents to the same day clinic for No chief complaint on file.  He has a sores in his mouth on his left cheek that has been there for 3 days. He isn't able to eat because moving his gums is painful. He has not had any bleeding. He has tried warm saltwater rinses that help when he uses it.     Denies fevers, chills, shortness of breath, headache, cough, chest pain, nausea, vomiting, diarrhea. Denies weight loss. Denies rashes. Denies any change in chronic joint pain.    Meds and allergies were reviewed in Epic    ROS: 10 point ROS was performed and is otherwise negative other than mentioned in the HPI    Objective  PE:  Vitals:    08/07/22 1046   BP: 156/98   Pulse: 72   Temp: 36.6 ??C (97.8 ??F)   SpO2: 98%     General: well-appearing in NAD  Eyes: EOMI, sclera  clear, PERRL  ENT: OP clear w/o erythema or exudate  CV: regular, no murmurs  Resp: CTAB, no wheezes or crackles, normal WOB  GI: soft, NTND, NABS  MSK: full ROM, no deformity noted  Skin: clean and dry, no rashes or lesions noted  Ext: no cyanosis/clubbing/edema  Neuro: alert, follows commands. CN II-XII grossly intact    Procedure: None  See procedure note from this encounter    _______________________________________________________________________________________    Same Day Metric Tracker:     Same Day Metric Tracker:  Referral source for today's visit:  Geriatrics    Referral to other outpatient urgent services? N/A    Did today's visit result in referral to ED or direct admission? No

## 2022-08-07 NOTE — Unmapped (Addendum)
It was a pleasure seeing you in clinic today! Here is a summary of what we discussed:    -you have an ulcer under your tongue  -we are going to swab it for a virus today. If it is positive for a virus called HSV, we will start a medication called valacyclovir  -increase your folic acid to 5mg  daily    -If you have worsening problems eating, notice more ulcers or the ulcer getting worse, you should call your rheumatologist to discuss changing your methotrexate dose    -For the gel, dry your tongue with gauze. Then put a less than a pea sized amount on the ulcer. Wait before eating and drinking for 30 minutes.

## 2022-08-09 MED ORDER — ALUMINUM-MAG HYDROXIDE-SIMETHICONE 400 MG-400 MG-40 MG/5 ML ORAL SUSP
0 refills | 0 days | Status: CP
Start: 2022-08-09 — End: 2022-08-09

## 2022-08-09 MED ORDER — DIPHENHYDRAMINE 12.5 MG/5 ML ORAL ELIXIR
0 refills | 0 days | Status: CP
Start: 2022-08-09 — End: 2022-08-09

## 2022-08-09 MED ORDER — LIDOCAINE HCL 2 % MUCOSAL SOLUTION
Freq: Four times a day (QID) | OROMUCOSAL | 0 refills | 2.00000 days | Status: CP | PRN
Start: 2022-08-09 — End: 2022-08-16

## 2022-08-09 NOTE — Unmapped (Signed)
[facility] CAN NOT compound. Please send to a compounding pharm.    lidocaine 2% viscous (LIDOCAINE) 2 % Soln [[id number]]    Order Details  Dose, Route, Frequency: As Directed   Dispense Quantity: 100 mL Refills: 0          Sig: Please mix 30 ml with the liquid Benadryl (30 ml) and Maalox (30 ml) then can swish 2 teaspoons for 1 minute and spit. Can repeat up to 4x a day as needed for mouth sore pain.     aluminum-magnesium hydroxide-simethicone (MAALOX MAXIMUM STRENGTH) 400-400-40 mg/5 mL suspension        diphenhydrAMINE (BENADRYL) 12.5 mg/5 mL elixir [[id number]]    Order Details    Dose, Route, Frequency: As Directed   Dispense Quantity: 120 mL Refills: 0          Sig: Please mix 30 ml with the liquid Maalox (30 ml) and lidocaine 2% (30 ml) then can swish 2 teaspoons for 1 minute and spit. Can repeat up to 4x a day as needed for mouth sore pain.           [facility] CAN NOT compound. Please send to a compounding pharm.

## 2022-08-09 NOTE — Unmapped (Signed)
2 days ago seen in same day clinic.    Unable to fill the prescription - cost is 150 dollar   lidocaine-diphenhydrAMINE-aluminum-magnesium [facility]) 25-200-400-40 mg/21mL M[facility] [[id number]]       Asking any other substitute ?

## 2022-08-09 NOTE — Unmapped (Signed)
Addended by: Fontaine No B on: 08/09/2022 02:12 PM     Modules accepted: Orders

## 2022-08-10 NOTE — Unmapped (Signed)
Spoke with patient's wife - due to magic mouth was cost, will proceed with viscous lidocaine. Attempted to provide instructions for home assembly, but turned out to be logistically difficult. Reji, RN, provided follow-up instructions.

## 2022-08-10 NOTE — Unmapped (Signed)
Addended by: Fontaine No B on: 08/09/2022 05:13 PM     Modules accepted: Orders

## 2022-08-12 NOTE — Unmapped (Signed)
I saw and evaluated the patient, participating in the key portions of the service.  I reviewed the resident’s note.  I agree with the resident’s findings and plan. Morrell Fluke R Winter Jocelyn, MD

## 2022-08-26 ENCOUNTER — Encounter
Admit: 2022-08-26 | Discharge: 2022-08-26 | Payer: MEDICARE | Attending: Certified Registered" | Primary: Certified Registered"

## 2022-08-26 ENCOUNTER — Ambulatory Visit: Admit: 2022-08-26 | Discharge: 2022-08-26 | Payer: MEDICARE

## 2022-08-26 MED ADMIN — lactated Ringers infusion: 10 mL/h | INTRAVENOUS | @ 20:00:00 | Stop: 2022-08-26

## 2022-08-26 MED ADMIN — Propofol (DIPRIVAN) injection: INTRAVENOUS | @ 20:00:00 | Stop: 2022-08-26

## 2022-08-26 MED ADMIN — propofol (DIPRIVAN) infusion 10 mg/mL: INTRAVENOUS | @ 20:00:00 | Stop: 2022-08-26

## 2022-08-31 NOTE — Unmapped (Signed)
Regarding: genital issues-- #####only wants to talk to a male nurse####  ----- Message from Rogelio Seen sent at 08/31/2022  1:07 PM EDT -----  If you had not called [facility], what do you think you would have done?   Make Appointment / Call Office

## 2022-08-31 NOTE — Unmapped (Signed)
Reason for Disposition   Message left on identified voice mail    Answer Assessment - Initial Assessment Questions  N/A  Multiple attempts to call patient with no answer.  Messages left on VM requesting call back as needed.    Protocols used: No Contact or Duplicate Contact Call-A-AH

## 2022-09-02 NOTE — Unmapped (Signed)
Specialty Medication(s): methotrexate    Ralph Santos has been dis-enrolled from the [facility] specialty pharmacy services due to a pharmacy change. The patient is now filling at [facility] .    Additional information provided to the patient: n/a    Julianne Rice, PharmD  [facility] Specialty Pharmacist

## 2022-09-03 MED ORDER — EMPAGLIFLOZIN 10 MG TABLET
ORAL_TABLET | Freq: Every day | ORAL | 0 refills | 90 days
Start: 2022-09-03 — End: ?

## 2022-09-04 ENCOUNTER — Ambulatory Visit
Admit: 2022-09-04 | Discharge: 2022-09-04 | Payer: MEDICARE | Attending: Student in an Organized Health Care Education/Training Program | Primary: Student in an Organized Health Care Education/Training Program

## 2022-09-04 ENCOUNTER — Ambulatory Visit: Admit: 2022-09-04 | Discharge: 2022-09-04 | Payer: MEDICARE

## 2022-09-04 DIAGNOSIS — N5089 Other specified disorders of the male genital organs: Principal | ICD-10-CM

## 2022-09-04 DIAGNOSIS — N481 Balanitis: Principal | ICD-10-CM

## 2022-09-04 DIAGNOSIS — Z114 Encounter for screening for human immunodeficiency virus [HIV]: Principal | ICD-10-CM

## 2022-09-04 MED ORDER — CEPHALEXIN 500 MG CAPSULE
ORAL_CAPSULE | Freq: Four times a day (QID) | ORAL | 0 refills | 7 days | Status: CP
Start: 2022-09-04 — End: 2022-09-11

## 2022-09-04 MED ORDER — CLOTRIMAZOLE 1 % TOPICAL CREAM
Freq: Two times a day (BID) | TOPICAL | 2 refills | 0 days | Status: CP
Start: 2022-09-04 — End: 2022-09-18

## 2022-09-05 DIAGNOSIS — N481 Balanitis: Principal | ICD-10-CM

## 2022-09-09 ENCOUNTER — Ambulatory Visit
Admit: 2022-09-09 | Discharge: 2022-09-10 | Payer: MEDICARE | Attending: Student in an Organized Health Care Education/Training Program | Primary: Student in an Organized Health Care Education/Training Program

## 2022-09-09 DIAGNOSIS — K121 Other forms of stomatitis: Principal | ICD-10-CM

## 2022-09-09 DIAGNOSIS — N5089 Other specified disorders of the male genital organs: Principal | ICD-10-CM

## 2022-09-09 MED ORDER — FOLIC ACID 1 MG TABLET
ORAL_TABLET | Freq: Every day | ORAL | 0 refills | 30 days | Status: CP
Start: 2022-09-09 — End: 2022-10-09

## 2022-09-11 ENCOUNTER — Ambulatory Visit: Admit: 2022-09-11 | Discharge: 2022-09-12 | Payer: MEDICARE

## 2022-09-11 DIAGNOSIS — Z556 Difficulty demonstrating health literacy: Principal | ICD-10-CM

## 2022-09-11 DIAGNOSIS — R360 Urethral discharge without blood: Principal | ICD-10-CM

## 2022-09-11 DIAGNOSIS — N481 Balanitis: Principal | ICD-10-CM

## 2022-09-11 DIAGNOSIS — N485 Ulcer of penis: Principal | ICD-10-CM

## 2022-09-24 DIAGNOSIS — N485 Ulcer of penis: Principal | ICD-10-CM

## 2022-09-25 ENCOUNTER — Ambulatory Visit: Admit: 2022-09-25 | Discharge: 2022-09-26 | Payer: MEDICARE

## 2022-09-25 DIAGNOSIS — R360 Urethral discharge without blood: Principal | ICD-10-CM

## 2022-09-25 DIAGNOSIS — N471 Phimosis: Principal | ICD-10-CM

## 2022-10-03 DIAGNOSIS — A498 Other bacterial infections of unspecified site: Principal | ICD-10-CM

## 2022-10-03 DIAGNOSIS — R360 Urethral discharge without blood: Principal | ICD-10-CM

## 2022-10-03 MED ORDER — CEFDINIR 300 MG CAPSULE
ORAL_CAPSULE | Freq: Two times a day (BID) | ORAL | 0 refills | 7 days | Status: CP
Start: 2022-10-03 — End: 2022-10-10

## 2022-10-04 DIAGNOSIS — R360 Urethral discharge without blood: Principal | ICD-10-CM

## 2022-10-04 DIAGNOSIS — A498 Other bacterial infections of unspecified site: Principal | ICD-10-CM

## 2022-10-08 ENCOUNTER — Ambulatory Visit: Admit: 2022-10-08 | Discharge: 2022-10-09 | Payer: MEDICARE

## 2022-10-08 DIAGNOSIS — N471 Phimosis: Principal | ICD-10-CM

## 2022-10-08 MED ORDER — CLOBETASOL 0.05 % TOPICAL OINTMENT
Freq: Two times a day (BID) | TOPICAL | 11 refills | 0 days | Status: CP
Start: 2022-10-08 — End: 2023-10-08

## 2022-10-24 MED ORDER — EMPAGLIFLOZIN 10 MG TABLET
ORAL_TABLET | Freq: Every day | ORAL | 0 refills | 90 days
Start: 2022-10-24 — End: ?

## 2022-11-01 ENCOUNTER — Ambulatory Visit: Admit: 2022-11-01 | Payer: MEDICARE | Attending: Internal Medicine | Primary: Internal Medicine

## 2022-11-01 ENCOUNTER — Ambulatory Visit: Admit: 2022-11-01 | Payer: MEDICARE

## 2022-11-05 ENCOUNTER — Ambulatory Visit: Admit: 2022-11-05 | Payer: MEDICARE | Attending: Gerontology | Primary: Gerontology

## 2022-11-20 ENCOUNTER — Ambulatory Visit: Admit: 2022-11-20 | Payer: MEDICARE

## 2022-12-09 ENCOUNTER — Ambulatory Visit: Admit: 2022-12-09 | Discharge: 2022-12-10 | Payer: MEDICARE

## 2022-12-09 DIAGNOSIS — K1370 Unspecified lesions of oral mucosa: Principal | ICD-10-CM

## 2022-12-09 MED ORDER — MAGIC MOUTH WASH PLUS LIDOCAINE (WEB)
Freq: Four times a day (QID) | ORAL | 1 refills | 5.00000 days | Status: CP | PRN
Start: 2022-12-09 — End: 2022-12-09

## 2022-12-10 MED ORDER — LIDOCAINE HCL 2 % MUCOSAL SOLUTION
Freq: Four times a day (QID) | OROMUCOSAL | 0 refills | 2 days | PRN
Start: 2022-12-10 — End: 2022-12-17

## 2022-12-11 DIAGNOSIS — K121 Other forms of stomatitis: Principal | ICD-10-CM

## 2022-12-11 MED ORDER — MAGIC MOUTHWASH (NYSTATIN/DIPHENHYDRAMINE/MYLANTA) ORAL MIXTURE
Freq: Four times a day (QID) | ORAL | 0 refills | 5 days | Status: CP | PRN
Start: 2022-12-11 — End: 2022-12-16

## 2022-12-25 ENCOUNTER — Emergency Department (HOSPITAL_COMMUNITY): Payer: 59

## 2022-12-25 ENCOUNTER — Other Ambulatory Visit: Payer: Self-pay

## 2022-12-25 ENCOUNTER — Emergency Department (HOSPITAL_COMMUNITY)
Admission: EM | Admit: 2022-12-25 | Discharge: 2022-12-29 | Disposition: A | Discharge status: Home / Self Care | Payer: 59 | Attending: Emergency Medicine | Admitting: Emergency Medicine

## 2022-12-25 DIAGNOSIS — F0390 Unspecified dementia without behavioral disturbance: Secondary | ICD-10-CM | POA: Insufficient documentation

## 2022-12-25 DIAGNOSIS — R2689 Other abnormalities of gait and mobility: Secondary | ICD-10-CM | POA: Insufficient documentation

## 2022-12-25 DIAGNOSIS — R5381 Other malaise: Secondary | ICD-10-CM | POA: Diagnosis not present

## 2022-12-25 DIAGNOSIS — Z7982 Long term (current) use of aspirin: Secondary | ICD-10-CM | POA: Insufficient documentation

## 2022-12-25 DIAGNOSIS — F0280 Dementia in other diseases classified elsewhere without behavioral disturbance: Secondary | ICD-10-CM

## 2022-12-25 DIAGNOSIS — U071 COVID-19: Secondary | ICD-10-CM | POA: Insufficient documentation

## 2022-12-25 DIAGNOSIS — W010XXA Fall on same level from slipping, tripping and stumbling without subsequent striking against object, initial encounter: Secondary | ICD-10-CM | POA: Insufficient documentation

## 2022-12-25 DIAGNOSIS — M6281 Muscle weakness (generalized): Secondary | ICD-10-CM | POA: Diagnosis not present

## 2022-12-25 DIAGNOSIS — N471 Phimosis: Secondary | ICD-10-CM | POA: Insufficient documentation

## 2022-12-25 DIAGNOSIS — R059 Cough, unspecified: Secondary | ICD-10-CM | POA: Diagnosis present

## 2022-12-25 DIAGNOSIS — Z7902 Long term (current) use of antithrombotics/antiplatelets: Secondary | ICD-10-CM | POA: Diagnosis not present

## 2022-12-25 DIAGNOSIS — R531 Weakness: Secondary | ICD-10-CM

## 2022-12-25 LAB — COMPREHENSIVE METABOLIC PANEL
ALT: 14 U/L (ref 0–44)
AST: 19 U/L (ref 15–41)
Albumin: 3.1 g/dL — ABNORMAL LOW (ref 3.5–5.0)
Alkaline Phosphatase: 22 U/L — ABNORMAL LOW (ref 38–126)
Anion gap: 10 (ref 5–15)
BUN: 6 mg/dL — ABNORMAL LOW (ref 8–23)
CO2: 23 mmol/L (ref 22–32)
Calcium: 9.2 mg/dL (ref 8.9–10.3)
Chloride: 103 mmol/L (ref 98–111)
Creatinine, Ser: 1.15 mg/dL (ref 0.61–1.24)
GFR, Estimated: >60 mL/min (ref >60)
Glucose, Bld: 95 mg/dL (ref 70–99)
Potassium: 3.9 mmol/L (ref 3.5–5.1)
Sodium: 136 mmol/L (ref 135–145)
Total Bilirubin: 0.6 mg/dL (ref 0.3–1.2)
Total Protein: 6.1 g/dL — ABNORMAL LOW (ref 6.5–8.1)

## 2022-12-25 LAB — CBC
HCT: 35.6 % — ABNORMAL LOW (ref 39.0–52.0)
Hemoglobin: 11.6 g/dL — ABNORMAL LOW (ref 13.0–17.0)
MCH: 31 pg (ref 26.0–34.0)
MCHC: 32.6 g/dL (ref 30.0–36.0)
MCV: 95.2 fL (ref 80.0–100.0)
Platelets: 157 10*3/uL (ref 150–400)
RBC: 3.74 MIL/uL — ABNORMAL LOW (ref 4.22–5.81)
RDW: 15.7 % — ABNORMAL HIGH (ref 11.5–15.5)
WBC: 4.9 10*3/uL (ref 4.0–10.5)
nRBC: 0 % (ref 0.0–0.2)

## 2022-12-25 LAB — URINALYSIS, ROUTINE W REFLEX MICROSCOPIC
Bilirubin Urine: NEGATIVE
Glucose, UA: NEGATIVE mg/dL
Hgb urine dipstick: NEGATIVE
Ketones, ur: NEGATIVE mg/dL
Leukocytes,Ua: NEGATIVE
Nitrite: NEGATIVE
Protein, ur: NEGATIVE mg/dL
Specific Gravity, Urine: 1.011 (ref 1.005–1.030)
pH: 6 (ref 5.0–8.0)

## 2022-12-25 LAB — SARS CORONAVIRUS 2 BY RT PCR: SARS Coronavirus 2 by RT PCR: POSITIVE — AB

## 2022-12-25 MED ORDER — ASPIRIN 81 MG PO CHEW
81.0000 mg | CHEWABLE_TABLET | Freq: Every day | ORAL | Status: DC
  Administered 2022-12-25 – 2022-12-29 (×5): 81 mg via ORAL
  Filled 2022-12-25 (×5): qty 1

## 2022-12-25 MED ORDER — PANTOPRAZOLE SODIUM 40 MG PO TBEC
40.0000 mg | DELAYED_RELEASE_TABLET | Freq: Every day | ORAL | Status: DC
  Administered 2022-12-25 – 2022-12-29 (×5): 40 mg via ORAL
  Filled 2022-12-25 (×5): qty 1

## 2022-12-25 MED ORDER — MIRTAZAPINE 15 MG PO TABS: 7.5000 mg | ORAL_TABLET | Freq: Every day | ORAL | Status: DC

## 2022-12-25 MED ORDER — ATORVASTATIN CALCIUM 40 MG PO TABS
40.0000 mg | ORAL_TABLET | Freq: Every day | ORAL | Status: DC
  Administered 2022-12-25 – 2022-12-29 (×5): 40 mg via ORAL
  Filled 2022-12-25 (×5): qty 1

## 2022-12-25 MED ORDER — CARVEDILOL 3.125 MG PO TABS
6.2500 mg | ORAL_TABLET | Freq: Two times a day (BID) | ORAL | Status: DC
  Administered 2022-12-25 – 2022-12-29 (×7): 6.25 mg via ORAL
  Filled 2022-12-25 (×8): qty 2

## 2022-12-25 MED ORDER — GABAPENTIN 100 MG PO CAPS: 100.0000 mg | ORAL_CAPSULE | Freq: Three times a day (TID) | ORAL | Status: DC

## 2022-12-25 MED ORDER — GABAPENTIN 100 MG PO CAPS
100.0000 mg | ORAL_CAPSULE | Freq: Three times a day (TID) | ORAL | Status: DC
  Administered 2022-12-25 – 2022-12-29 (×12): 100 mg via ORAL
  Filled 2022-12-25 (×12): qty 1

## 2022-12-25 MED ORDER — CARVEDILOL 3.125 MG PO TABS: 6.2500 mg | ORAL_TABLET | Freq: Two times a day (BID) | ORAL | Status: DC

## 2022-12-25 MED ORDER — MIRTAZAPINE 15 MG PO TABS
7.5000 mg | ORAL_TABLET | Freq: Every day | ORAL | Status: DC
  Administered 2022-12-25 – 2022-12-28 (×4): 7.5 mg via ORAL
  Filled 2022-12-25 (×4): qty 1

## 2022-12-25 MED ORDER — OXYBUTYNIN CHLORIDE ER 5 MG PO TB24
5.0000 mg | ORAL_TABLET | Freq: Every day | ORAL | Status: DC
  Administered 2022-12-25 – 2022-12-28 (×4): 5 mg via ORAL
  Filled 2022-12-25 (×5): qty 1

## 2022-12-25 MED ORDER — TAMSULOSIN HCL 0.4 MG PO CAPS: 0.4000 mg | ORAL_CAPSULE | Freq: Every day | ORAL | Status: DC

## 2022-12-25 MED ORDER — TAMSULOSIN HCL 0.4 MG PO CAPS
0.4000 mg | ORAL_CAPSULE | Freq: Every day | ORAL | Status: DC
  Administered 2022-12-25 – 2022-12-29 (×5): 0.4 mg via ORAL
  Filled 2022-12-25 (×5): qty 1

## 2022-12-25 NOTE — ED Notes (Signed)
This RN heard the bed alarm going off and I went into an found the patient ambulating around the room and he was very wobbly. This RN assisted the patient back into his bed. He was easily redirected. Order placed for tele-sitter.

## 2022-12-25 NOTE — NC FL2 (Signed)
[place] MEDICAID FL2 LEVEL OF CARE FORM     IDENTIFICATION  Patient Name: Dennis [name] Birthdate: Sep 26, 1945 Sex: male Admission Date (Current Location): 12/25/2022  [facility] and [place] Number:  Producer, television/film/video and Address:  [facility] [place], [address], [place], [place] [address]      Provider Number: [id number]  Attending Physician Name and Address:  Default, Provider, MD  Relative Name and Phone Number:  Maren [place], [phone]    Current Level of Care: Hospital Recommended Level of Care: Skilled Nursing Facility Prior Approval Number:    Date Approved/Denied:   PASRR Number: PENDING. System not working  Discharge Plan: SNF    Current Diagnoses: Patient Active Problem List   Diagnosis Date Noted   HYPERLIPIDEMIA 06/02/2009   HYPERTENSION 06/02/2009   AORTIC INSUFFICIENCY, MILD 06/02/2009   DYSPNEA ON EXERTION 06/02/2009   CHEST PAIN, ATYPICAL 06/02/2009    Orientation RESPIRATION BLADDER Height & Weight     Self, Place  Normal Incontinent Weight: 150 lb (68 kg) Height:  5\' 4"  (162.6 cm)  BEHAVIORAL SYMPTOMS/MOOD NEUROLOGICAL BOWEL NUTRITION STATUS      Incontinent Diet (Regular)  AMBULATORY STATUS COMMUNICATION OF NEEDS Skin   Extensive Assist Verbally Normal                       Personal Care Assistance Level of Assistance  Bathing, Feeding, Dressing Bathing Assistance: Limited assistance Feeding assistance: Independent Dressing Assistance: Limited assistance     Functional Limitations Info  Sight, Hearing, Speech Sight Info: Adequate Hearing Info: Impaired (Hard of hearing) Speech Info: Adequate    SPECIAL CARE FACTORS FREQUENCY                       Contractures Contractures Info: Not present    Additional Factors Info  Code Status, Allergies Code Status Info: Not on File Allergies Info: Codeine, Shellfish Allergy           Current Medications (12/25/2022):  This is the current  hospital active medication list Current Facility-Administered Medications  Medication Dose Route Frequency Provider Last Rate Last Admin   aspirin chewable tablet 81 mg  81 mg Oral Daily Cathren Laine, MD       atorvastatin (LIPITOR) tablet 40 mg  40 mg Oral Daily Cathren Laine, MD       carvedilol (COREG) tablet 6.25 mg  6.25 mg Oral BID WC Cathren Laine, MD       gabapentin (NEURONTIN) capsule 100 mg  100 mg Oral TID Cathren Laine, MD       mirtazapine (REMERON) tablet 7.5 mg  7.5 mg Oral QHS Cathren Laine, MD       oxybutynin (DITROPAN-XL) 24 hr tablet 5 mg  5 mg Oral QHS Cathren Laine, MD       pantoprazole (PROTONIX) EC tablet 40 mg  40 mg Oral Daily Cathren Laine, MD       tamsulosin (FLOMAX) capsule 0.4 mg  0.4 mg Oral Daily Cathren Laine, MD       Current Outpatient Medications  Medication Sig Dispense Refill   cefdinir (OMNICEF) 300 MG capsule Take 300 mg by mouth 2 (two) times daily.     clobetasol ointment (TEMOVATE) 0.05 % Apply 1 Application topically 2 (two) times daily. For 2 weeks, then stop for 1 week. Repeat as needed.     methotrexate 50 MG/2ML injection Inject 30 mg/m2 into the skin once a week.  atorvastatin (LIPITOR) 40 MG tablet Take 1 tablet by mouth daily.     carvedilol (COREG) 3.125 MG tablet Take by mouth.     clonazePAM (KLONOPIN) 0.5 MG tablet Take by mouth.     clopidogrel (PLAVIX) 75 MG tablet Take by mouth.     doxycycline (VIBRAMYCIN) 100 MG capsule Take 1 capsule (100 mg total) by mouth 2 (two) times daily. 20 capsule 0   finasteride (PROSCAR) 5 MG tablet Take by mouth.     folic acid (FOLVITE) 1 MG tablet Take 1 mg by mouth daily.     gabapentin (NEURONTIN) 100 MG capsule Take 100 mg by mouth 3 (three) times daily.     lidocaine (XYLOCAINE) 2 % solution Use as directed 15 mLs in the mouth or throat every 6 (six) hours as needed for mouth pain.     losartan (COZAAR) 25 MG tablet Take 1 tablet by mouth daily.     mirtazapine (REMERON) 15 MG tablet TAKE  1/2 (ONE-HALF) TABLET BY MOUTH AT BEDTIME FOR ANXIETY     omeprazole (PRILOSEC) 20 MG capsule Take 20 mg by mouth 2 (two) times daily.     sulfaSALAzine (AZULFIDINE) 500 MG tablet Take by mouth.     tamsulosin (FLOMAX) 0.4 MG CAPS capsule Take by mouth.     traMADol (ULTRAM) 50 MG tablet Take by mouth.     triamcinolone cream (KENALOG) 0.1 % Apply 1 application topically 2 (two) times daily. 30 g 0     Discharge Medications: Please see discharge summary for a list of discharge medications.  Relevant Imaging Results:  Relevant Lab Results:   Additional Information SSN: [id number]  Susa Simmonds, LCSWA

## 2022-12-25 NOTE — ED Notes (Signed)
Pt confused and saturated stretcher at this time with urine. Pt was seen getting out of bed after posey alarm went off. Pt reoriented to his environment. Pt was cleaned up and new linen to stretcher and pt placed in new hospital gown. Condom cath placed at this time. Ptw. Call bell in reach.

## 2022-12-25 NOTE — ED Triage Notes (Signed)
Pt was a visitor in ED in another patient's room and was noted to be lying on floor in rm when RN was checking on patient with urine on the floor. Pt noted oriented to self and place. Appears to be his baseline. Denies pain at this time. No visible injuries noted at this time.
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
## 2022-12-25 NOTE — ED Provider Notes (Signed)
[facility] EMERGENCY DEPARTMENT AT [facility] Provider Note   CSN: [id number] Arrival date & time: 12/25/22  7253     History  Chief Complaint  Patient presents with   Dennis [name] is a 77 y.o. male.  Pt with hx copd, hx dysarthria and weakness/left leg weakness, hx disorientation?dementia, presents from other ED room where he was visitor with spouse who is being admitted to hospital. Pt was noted to be on ground, unclear if sat on floor or fell. Pt limited historian - level 5 caveat. Pt appears generally weak and mildly confused. Denies specific pain or injury. No headache. No neck or back pain. Denies specific extremity pain or injury. No new numbness/weakness. No new change in speech or vision. No chest pain. No sob. +non prod cough. No abd pain or nvd. No dysuria.   The history is provided by the patient and medical records. The history is limited by the condition of the patient.  Fall Pertinent negatives include no chest pain, no abdominal pain, no headaches and no shortness of breath.       Home Medications Prior to Admission medications   Medication Sig Start Date End Date Taking? Authorizing Provider  cefdinir (OMNICEF) 300 MG capsule Take 300 mg by mouth 2 (two) times daily. 10/03/22  Yes [provider]  clobetasol ointment (TEMOVATE) 0.05 % Apply 1 Application topically 2 (two) times daily. For 2 weeks, then stop for 1 week. Repeat as needed. 10/08/22 10/08/23 Yes [provider]  methotrexate 50 MG/2ML injection Inject 30 mg/m2 into the skin once a week. 12/09/22  Yes [provider]  atorvastatin (LIPITOR) 40 MG tablet Take 1 tablet by mouth daily. 07/12/20   [provider]  carvedilol (COREG) 3.125 MG tablet Take by mouth.    [provider]  clonazePAM (KLONOPIN) 0.5 MG tablet Take by mouth.    [provider]  clopidogrel (PLAVIX) 75 MG tablet Take by mouth.    [provider]   doxycycline (VIBRAMYCIN) 100 MG capsule Take 1 capsule (100 mg total) by mouth 2 (two) times daily. 06/26/22   Tomi Bamberger, PA-C  finasteride (PROSCAR) 5 MG tablet Take by mouth.    [provider]  folic acid (FOLVITE) 1 MG tablet Take 1 mg by mouth daily. 08/02/20   [provider]  gabapentin (NEURONTIN) 100 MG capsule Take 100 mg by mouth 3 (three) times daily. 11/08/20   [provider]  lidocaine (XYLOCAINE) 2 % solution Use as directed 15 mLs in the mouth or throat every 6 (six) hours as needed for mouth pain.    [provider]  losartan (COZAAR) 25 MG tablet Take 1 tablet by mouth daily. 08/15/20   [provider]  mirtazapine (REMERON) 15 MG tablet TAKE 1/2 (ONE-HALF) TABLET BY MOUTH AT BEDTIME FOR ANXIETY 01/06/20   [provider]  omeprazole (PRILOSEC) 20 MG capsule Take 20 mg by mouth 2 (two) times daily. 09/24/20   [provider]  sulfaSALAzine (AZULFIDINE) 500 MG tablet Take by mouth. 10/25/20 01/23/21  [provider]  tamsulosin (FLOMAX) 0.4 MG CAPS capsule Take by mouth.    [provider]  traMADol (ULTRAM) 50 MG tablet Take by mouth.    [provider]  triamcinolone cream (KENALOG) 0.1 % Apply 1 application topically 2 (two) times daily. 11/24/20   Wieters, Hallie C, PA-C      Allergies    Codeine and Shellfish allergy  Review of Systems   Review of Systems  Constitutional:  Negative for fever.  HENT:  Negative for sore throat.   Eyes:  Negative for redness.  Respiratory:  Positive for cough. Negative for shortness of breath.   Cardiovascular:  Negative for chest pain.  Gastrointestinal:  Negative for abdominal pain and vomiting.  Genitourinary:  Negative for dysuria and flank pain.  Musculoskeletal:  Negative for back pain and neck pain.  Skin:  Negative for wound.  Neurological:  Negative for headaches.    Physical Exam Updated Vital Signs BP (!) 142/69   Pulse 90   Temp  98.9 F (37.2 C) (Oral)   Resp (!) 24   Ht 1.626 m (5\' 4" )   Wt 68 kg   SpO2 94%   BMI 25.75 kg/m  Physical Exam Vitals and nursing note reviewed.  Constitutional:      Appearance: Normal appearance. He is well-developed.  HENT:     Head: Atraumatic.     Nose: Nose normal.     Mouth/Throat:     Mouth: Mucous membranes are moist.     Pharynx: Oropharynx is clear.  Eyes:     General: No scleral icterus.    Conjunctiva/sclera: Conjunctivae normal.     Pupils: Pupils are equal, round, and reactive to light.  Neck:     Vascular: No carotid bruit.     Trachea: No tracheal deviation.  Cardiovascular:     Rate and Rhythm: Normal rate and regular rhythm.     Pulses: Normal pulses.     Heart sounds: Normal heart sounds. No murmur heard.    No friction rub. No gallop.  Pulmonary:     Effort: Pulmonary effort is normal. No accessory muscle usage or respiratory distress.     Breath sounds: Normal breath sounds.  Abdominal:     General: Bowel sounds are normal. There is no distension.     Palpations: Abdomen is soft.     Tenderness: There is no abdominal tenderness. There is no guarding.  Genitourinary:    Comments: No cva tenderness. Uncircumcised, +phimosis.  Musculoskeletal:        General: No swelling or tenderness.     Cervical back: Normal range of motion and neck supple. No rigidity.     Comments: CTLS spine, non tender, aligned, no step off. Good rom bil extremities without pain or focal bony tenderness.   Skin:    General: Skin is warm and dry.     Findings: No rash.  Neurological:     Mental Status: He is alert.     Comments: Alert, +HOH. Able to answer simple questions.  Gcs 15. Motor/sens grossly intact bil.   Psychiatric:        Mood and Affect: Mood normal.     ED Results / Procedures / Treatments   Labs (all labs ordered are listed, but only abnormal results are displayed) Results for orders placed or performed during the hospital encounter of 12/25/22   SARS Coronavirus 2 by RT PCR (hospital order, performed in [facility] hospital lab) *cepheid single result test* Anterior Nasal Swab   Specimen: Anterior Nasal Swab  Result Value Ref Range   SARS Coronavirus 2 by RT PCR POSITIVE (A) NEGATIVE  CBC  Result Value Ref Range   WBC 4.9 4.0 - 10.5 K/uL   RBC 3.74 (L) 4.22 - 5.81 MIL/uL   Hemoglobin 11.6 (L) 13.0 - 17.0 g/dL   HCT 41.3 (L) 24.4 - 01.0 %   MCV  95.2 80.0 - 100.0 fL   MCH 31.0 26.0 - 34.0 pg   MCHC 32.6 30.0 - 36.0 g/dL   RDW 40.9 (H) 81.1 - 91.4 %   Platelets 157 150 - 400 K/uL   nRBC 0.0 0.0 - 0.2 %  Comprehensive metabolic panel  Result Value Ref Range   Sodium 136 135 - 145 mmol/L   Potassium 3.9 3.5 - 5.1 mmol/L   Chloride 103 98 - 111 mmol/L   CO2 23 22 - 32 mmol/L   Glucose, Bld 95 70 - 99 mg/dL   BUN 6 (L) 8 - 23 mg/dL   Creatinine, Ser 7.82 0.61 - 1.24 mg/dL   Calcium 9.2 8.9 - 95.6 mg/dL   Total Protein 6.1 (L) 6.5 - 8.1 g/dL   Albumin 3.1 (L) 3.5 - 5.0 g/dL   AST 19 15 - 41 U/L   ALT 14 0 - 44 U/L   Alkaline Phosphatase 22 (L) 38 - 126 U/L   Total Bilirubin 0.6 0.3 - 1.2 mg/dL   GFR, Estimated >21 >30 mL/min   Anion gap 10 5 - 15  Urinalysis, Routine w reflex microscopic -Urine, Catheterized  Result Value Ref Range   Color, Urine YELLOW YELLOW   APPearance CLEAR CLEAR   Specific Gravity, Urine 1.011 1.005 - 1.030   pH 6.0 5.0 - 8.0   Glucose, UA NEGATIVE NEGATIVE mg/dL   Hgb urine dipstick NEGATIVE NEGATIVE   Bilirubin Urine NEGATIVE NEGATIVE   Ketones, ur NEGATIVE NEGATIVE mg/dL   Protein, ur NEGATIVE NEGATIVE mg/dL   Nitrite NEGATIVE NEGATIVE   Leukocytes,Ua NEGATIVE NEGATIVE     EKG None  Radiology CT Head Wo Contrast  Result Date: 12/25/2022 CLINICAL DATA:  77 year old male found down, disoriented, disc content of urine. EXAM: CT HEAD WITHOUT CONTRAST TECHNIQUE: Contiguous axial images were obtained from the base of the skull through the vertex without intravenous contrast. RADIATION DOSE  REDUCTION: This exam was performed according to the departmental dose-optimization program which includes automated exposure control, adjustment of the mA and/or kV according to patient size and/or use of iterative reconstruction technique. COMPARISON:  Head CT 09/09/2010. FINDINGS: Brain: Cerebral volume loss since 2012 appears fairly generalized. No midline shift, ventriculomegaly, mass effect, evidence of mass lesion, intracranial hemorrhage or evidence of cortically based acute infarction. Confluent bilateral cerebral white matter hypodensity, with greater subcortical white matter involvement in the right posterior frontal lobe (sagittal image 15). But no mass effect suggestive of vasogenic edema. Some deep white matter capsule involvement. Deep gray matter nuclei, brainstem and cerebellum appear spared. Vascular: No suspicious intracranial vascular hyperdensity. Calcified atherosclerosis at the skull base. Skull: Mild motion artifact at the level of the anterior cranial fossa. No acute osseous abnormality identified. Sinuses/Orbits: Visualized paranasal sinuses and mastoids are stable and well aerated now. Other: Visualized orbits and scalp soft tissues are within normal limits. IMPRESSION: 1. No acute intracranial abnormality or acute traumatic injury identified. 2. Substantially progressed cerebral white matter disease and generalized brain volume loss since 2012. Favor sequelae of small vessel disease. Electronically Signed   By: Odessa Fleming M.D.   On: 12/25/2022 07:57   DG Chest Port 1 View  Result Date: 12/25/2022 CLINICAL DATA:  Cough. EXAM: PORTABLE CHEST 1 VIEW COMPARISON:  PA Lat 04/22/2022 FINDINGS: There is mild cardiomegaly. There is mild central vascular prominence but no evidence of edema. The lungs are clear.  There is no substantial pleural effusion. The mediastinum is normally outlined. The aortic arch is heavily calcified. Mild  degenerative change thoracic spine.  No new osseous findings.  Osteopenia.  Overlying monitor wires. IMPRESSION: 1. Mild cardiomegaly and mild central vascular prominence but no edema or consolidation. 2. Aortic atherosclerosis. 3. Osteopenia. Electronically Signed   By: Almira Bar M.D.   On: 12/25/2022 07:34    Procedures Procedures    Medications Ordered in ED Medications - No data to display  ED Course/ Medical Decision Making/ A&P                                 Medical Decision Making Problems Addressed: COVID-19 virus infection: acute illness or injury with systemic symptoms that poses a threat to life or bodily functions Dementia associated with other underlying disease, unspecified dementia severity, unspecified whether behavioral, psychotic, or mood disturbance or anxiety (HCC): chronic illness or injury with exacerbation, progression, or side effects of treatment that poses a threat to life or bodily functions Fall from slip, trip, or stumble, initial encounter: acute illness or injury with systemic symptoms that poses a threat to life or bodily functions Generalized weakness: chronic illness or injury with exacerbation, progression, or side effects of treatment that poses a threat to life or bodily functions Phimosis: chronic illness or injury Physical deconditioning: chronic illness or injury  Amount and/or Complexity of Data Reviewed Independent Historian:     Details: Fam, hx External Data Reviewed: notes. Labs: ordered. Decision-making details documented in ED Course. Radiology: ordered and independent interpretation performed. Decision-making details documented in ED Course.  Risk OTC drugs. Prescription drug management. Decision regarding hospitalization.   Iv ns. Continuous pulse ox and cardiac monitoring. Labs ordered/sent. Imaging ordered.   Differential diagnosis includes dehydration, aki, uti, etc. Dispo decision including potential need for admission considered - will get labs and imaging and reassess.   Reviewed  nursing notes and prior charts for additional history. External reports reviewed. Additional history from: family.   Cardiac monitor: sinus rhythm, rate 90.  Labs reviewed/interpreted by me - wbc normal. Lytes normal. Covid pos. Ua neg for uti.   Xrays reviewed/interpreted by me - no pna.   CT reviewed/interpreted by me - no hem.   Given weakness, confusion/confusion that impairs ability to attend to ADLs c/w dementia, and now spouse in hospital, ?whether pt able to manage on own without assistance and/or family support. TOC  team consulted.  Dispo per [facility] team.           Final Clinical Impression(s) / ED Diagnoses Final diagnoses:  Fall from slip, trip, or stumble, initial encounter  Generalized weakness  Physical deconditioning  COVID-19 virus infection  Phimosis  Dementia associated with other underlying disease, unspecified dementia severity, unspecified whether behavioral, psychotic, or mood disturbance or anxiety [facility])    Rx / DC Orders ED Discharge Orders     None         Cathren Laine, MD 12/25/22 1125

## 2022-12-25 NOTE — Discharge Planning (Signed)
RNCM placed PT order to evaluate functional independence and safety with mobility and ADLs.

## 2022-12-25 NOTE — Progress Notes (Signed)
CSW spoke with patients neighbor [name] [phone]. Rob stated that patient and his wife cannot manage at home on their own. [name] stated she has been talking to patients stepdaughter about what they plan on doing for both of them. [name] stated someone is going to have to care for both patient and his wife. [name] stated she doesn't know anything financially about them but will try to help as much as she can. CSW was also told patient has no family besides estranged brother.    CSW also reached out to [employer] for assistance with medicaid. CSW was told they would call back to find out if they can assist patient.

## 2022-12-25 NOTE — Progress Notes (Signed)
Patients assigned [facility] [employer] worker is Marlana Latus [phone]

## 2022-12-25 NOTE — Progress Notes (Signed)
CSW received a phone call from patients brother Daytron Gulan [phone]. Mr. Lojewski stated he hasn't spoken to his brother in 6 years but is willing to do anything he can for patient. Mr. Rappleye and his wife Velna Hatchet stated they would be willing to sign patient into a nursing facility if patients insurance approves him to go.

## 2022-12-25 NOTE — Progress Notes (Signed)
SNF bed search has been started

## 2022-12-25 NOTE — Evaluation (Signed)
Physical Therapy Evaluation Patient Details Name: Dennis [name] MRN: [id number] DOB: [birth year] Today's Date: 12/25/2022  History of Present Illness  76 y.o. male presents to [facility] hospital on 12/25/2022 as a visitor of a family member being admitted to the hospital. Pt was later found on the floor, unsure if a fall occurred. PMH includes COPD, dysarthria, LLE weakness, dementia.  Clinical Impression  Pt presents to PT with deficits in functional mobility, gait, balance, strength, power, endurance, cognition. Pt is unable to provide a reliable history due to cognitive deficits however notes in chart indicate the pt is ambulatory and driving at baseline, living with his spouse. Pt reports fatigue during session and tires quickly after standing, unable to ambulate in the room. Pt reports a history of passing out, PT is unable to assess BP as pt does not maintain arm still despite cues. PT recommends short term inpatient PT services due to lack of caregiver support and impaired mobility.      If plan is discharge home, recommend the following: A lot of help with walking and/or transfers;A lot of help with bathing/dressing/bathroom;Assistance with cooking/housework;Direct supervision/assist for medications management;Assist for transportation;Direct supervision/assist for financial management;Assistance with feeding;Help with stairs or ramp for entrance;Supervision due to cognitive status   Can travel by private vehicle   No    Equipment Recommendations Rolling walker (2 wheels);BSC/3in1  Recommendations for Other Services       Functional Status Assessment Patient has had a recent decline in their functional status and demonstrates the ability to make significant improvements in function in a reasonable and predictable amount of time.     Precautions / Restrictions Precautions Precautions: Fall Precaution Comments: COVID Restrictions Weight Bearing Restrictions: No      Mobility  Bed  Mobility Overal bed mobility: Needs Assistance Bed Mobility: Supine to Sit, Sit to Supine     Supine to sit: Contact guard Sit to supine: Contact guard assist        Transfers Overall transfer level: Needs assistance Equipment used: 1 person hand held assist Transfers: Sit to/from Stand Sit to Stand: Min assist                Ambulation/Gait Ambulation/Gait assistance: Min assist Gait Distance (Feet): 2 Feet Assistive device: 1 person hand held assist Gait Pattern/deviations: Step-to pattern Gait velocity: reduced Gait velocity interpretation: <1.31 ft/sec, indicative of household ambulator   General Gait Details: pt reports significant fatigue after 2' and must stop  Acupuncturist Bed    Modified Rankin (Stroke Patients Only)       Balance Overall balance assessment: Needs assistance Sitting-balance support: No upper extremity supported, Feet supported Sitting balance-Leahy Scale: Fair     Standing balance support: Single extremity supported, Reliant on assistive device for balance Standing balance-Leahy Scale: Poor                               Pertinent Vitals/Pain Pain Assessment Pain Assessment: No/denies pain    Home Living Family/patient expects to be discharged to:: Private residence Living Arrangements: Spouse/significant other                 Additional Comments: pt is unable to provide a history due AMS and HOH. Chart indicates the pt's spouse is hospitalized, possible intermittent assist of a neghbor    Prior Function Prior Level of  Function : Patient poor historian/Family not available;History of Falls (last six months)             Mobility Comments: chart indicates a history of passing out and falling       Extremity/Trunk Assessment   Upper Extremity Assessment Upper Extremity Assessment: Generalized weakness    Lower Extremity Assessment Lower Extremity  Assessment: Generalized weakness    Cervical / Trunk Assessment Cervical / Trunk Assessment: Normal  Communication   Communication Communication: Hearing impairment Cueing Techniques: Verbal cues;Tactile cues;Gestural cues  Cognition Arousal: Alert Behavior During Therapy: Flat affect Overall Cognitive Status: Impaired/Different from baseline Area of Impairment: Orientation, Attention, Memory, Following commands, Safety/judgement, Awareness, Problem solving                 Orientation Level: Disoriented to, Time, Situation (pt is oriented to place, recalls his wife is here at the hospital) Current Attention Level: Focused Memory: Decreased recall of precautions, Decreased short-term memory Following Commands: Follows one step commands with increased time, Follows multi-step commands inconsistently Safety/Judgement: Decreased awareness of safety, Decreased awareness of deficits Awareness: Intellectual Problem Solving: Slow processing, Decreased initiation, Difficulty sequencing, Requires verbal cues          General Comments General comments (skin integrity, edema, etc.): VSS on RA    Exercises     Assessment/Plan    PT Assessment Patient needs continued PT services  PT Problem List Decreased strength;Decreased activity tolerance;Decreased balance;Decreased mobility;Decreased cognition;Decreased knowledge of use of DME;Decreased safety awareness;Decreased knowledge of precautions       PT Treatment Interventions DME instruction;Functional mobility training;Therapeutic activities;Therapeutic exercise;Balance training;Neuromuscular re-education;Cognitive remediation;Patient/family education;Wheelchair mobility training;Gait training    PT Goals (Current goals can be found in the Care Plan section)  Acute Rehab PT Goals Patient Stated Goal: to feel better PT Goal Formulation: With patient Time For Goal Achievement: 01/08/23 Potential to Achieve Goals: Fair     Frequency Min 1X/week     Co-evaluation               AM-PAC PT "6 Clicks" Mobility  Outcome Measure Help needed turning from your back to your side while in a flat bed without using bedrails?: A Little Help needed moving from lying on your back to sitting on the side of a flat bed without using bedrails?: A Little Help needed moving to and from a bed to a chair (including a wheelchair)?: A Little Help needed standing up from a chair using your arms (e.g., wheelchair or bedside chair)?: A Little Help needed to walk in hospital room?: Total Help needed climbing 3-5 steps with a railing? : Total 6 Click Score: 14    End of Session   Activity Tolerance: Patient limited by fatigue Patient left: in bed;with call bell/phone within reach;with bed alarm set Nurse Communication: Mobility status PT Visit Diagnosis: Other abnormalities of gait and mobility (R26.89);Muscle weakness (generalized) (M62.81)    Time: 3016-0109 PT Time Calculation (min) (ACUTE ONLY): 24 min   Charges:   PT Evaluation $PT Eval Low Complexity: 1 Low   PT General Charges $$ ACUTE PT VISIT: 1 Visit         Arlyss Gandy, PT, DPT Acute Rehabilitation Office [phone]   Arlyss Gandy 12/25/2022, 1:48 PM

## 2022-12-25 NOTE — ED Notes (Addendum)
This pt was visiting his wife in another room in emergency department and had been sleeping in the recliner. Pt and wife seem to be confused at baseline.  While this RN was checking on the pt wife, the pt was found to be in the floor after trying to get to the bathroom. Notified charge RN and checked pt in to see EDP. Pt denies pain or hitting his head.

## 2022-12-25 NOTE — Progress Notes (Signed)
APS report made with [facility]

## 2022-12-26 NOTE — Progress Notes (Signed)
Patients PASRR [id number]

## 2022-12-26 NOTE — ED Provider Notes (Signed)
Emergency Medicine Observation Re-evaluation Note  Dennis [name] is a 77 y.o. male, seen on rounds today.  Pt initially presented to the ED for complaints of Fall Currently, the patient is patient with COVID and wife recently hospitalized.  Patient now unable to care for self.  He is awaiting placement  Physical Exam  BP (!) 132/55   Pulse 69   Temp 99.9 F (37.7 C) (Oral)   Resp 16   Ht 1.626 m (5\' 4" )   Wt 68 kg   SpO2 97%   BMI 25.75 kg/m  Physical Exam General: Elderly male in bed with no acute distress Cardiac: Heart is regular rate and rhythm blood pressure 132/55 Lungs: Normal respiratory rate with normal oxygen saturations Psych: Patient resting at this time  ED Course / MDM  EKG:   I have reviewed the labs performed to date as well as medications administered while in observation.  Recent changes in the last 24 hours include Lan for skilled nursing facility.  Plan  Current plan is for plan for skilled nursing facility.    Margarita Grizzle, MD 12/26/22 [phone]

## 2022-12-26 NOTE — Discharge Planning (Signed)
Licensed Clinical Social Worker is seeking post-discharge placement for this patient at the following level of care: skilled nursing facility    

## 2022-12-26 NOTE — Progress Notes (Signed)
CSW contacted patients case worker Marlana Latus [phone] with [facility] to provide an update. CSW left a message.

## 2022-12-26 NOTE — Progress Notes (Signed)
CSW spoke with [facility] in admissions with [facility] in regards to the bed offer. Patient will not be able to discharge due to being covid positive. CSW was told to check back next week.  CSW also spoke with Velna Hatchet with admissions at [facility] who stated she would review patient but due to patient having covid if he were to be accepted he would have to wait 10 days before he could discharge.   CSW spoke with [place] with [facility] about their covid policy and she stated she would reach out to the DON.   CSW spoke with Adella Nissen in admissions at [facility] who believes their covid policy is 10 days before patient could discharge to their facility. Kristal told CSW she would call her back with any updates

## 2022-12-26 NOTE — Progress Notes (Addendum)
Meriam Sprague, Artist, reported pt is receiving QMB Medicaid which means he doesn't qualify for full medicaid due to being over resources. Due to being over resources, pt cannot be converted to LTC Medicaid.

## 2022-12-27 ENCOUNTER — Encounter (HOSPITAL_COMMUNITY): Payer: Self-pay

## 2022-12-27 NOTE — Progress Notes (Signed)
Patient tested positive for covid on 12/25/22. Patient has three skilled nursing bed offers but unable to discharge until 10 day quarantine is complete.

## 2022-12-27 NOTE — ED Notes (Signed)
On the phone with spouse

## 2022-12-27 NOTE — ED Provider Notes (Signed)
Emergency Medicine Observation Re-evaluation Note  Dennis [name] is a 77 y.o. male, seen on rounds today.  Pt initially presented to the ED for complaints of Fall Currently, the patient is resting, no acute complaints.  Physical Exam  BP 112/75   Pulse 76   Temp 98.1 F (36.7 C) (Oral)   Resp 17   Ht 5\' 4"  (1.626 m)   Wt 68 kg   SpO2 95%   BMI 25.75 kg/m  Physical Exam General: no acute distress Cardiac: normal HR on monitor Lungs: normal effort Psych: no agitation  ED Course / MDM  EKG:   I have reviewed the labs performed to date as well as medications administered while in observation.  No recent changes in the last 24 hours.  Plan  Current plan is for skilled nursing facility placement.    Pricilla Loveless, MD 12/27/22 [phone]

## 2022-12-27 NOTE — ED Notes (Signed)
Pt resting in room with eyes closed at this time 

## 2022-12-27 NOTE — ED Notes (Signed)
[phone] is the number for the wife upstairs

## 2022-12-28 NOTE — ED Notes (Signed)
Family friend/neighbor arrived to ED and explained that she has pt's wife in her vehicle ready to take them both home/ Dr. Fredderick Phenix was made aware and spoke to [name] ( neighbor) and assured her pt is medically cleared to go home/ [name] states she will come back tomorrow and pick pt up after Child psychotherapist is made aware. Pt agrees with decision to stay.

## 2022-12-28 NOTE — ED Notes (Signed)
Pt spilled coffee and NT assisted with clean up

## 2022-12-28 NOTE — ED Notes (Signed)
Provided pt napkins, tissue, and towel.

## 2022-12-28 NOTE — Progress Notes (Signed)
Physical Therapy Treatment Patient Details Name: Dennis [name] MRN: [id number] DOB: Feb 21, 1946 Today's Date: 12/28/2022   History of Present Illness Pt is 77 y.o. male presents to [facility] hospital on 12/25/2022. Pt was visiting a pt in the ED and found on the floor in urine.  It is unclear if he fell.  Pt was weak and confused. Marland Kitchen PMH includes COPD, dysarthria, LLE weakness, dementia.    PT Comments  Pt demonstrating some improvements with mobility today, but does need mod cues for safety and RW use. With ambulation requiring min A with RW but mod A for balance without AD.  He was only oriented to self and needed frequent cues for safety and direction.  Will continue to benefit from PT.  Continues to be unsafe to return home alone due to cognition and fall risk.  Patient will benefit from continued inpatient follow up therapy, <3 hours/day at d/c.    If plan is discharge home, recommend the following: Assistance with cooking/housework;Direct supervision/assist for medications management;Assist for transportation;Direct supervision/assist for financial management;Assistance with feeding;Help with stairs or ramp for entrance;Supervision due to cognitive status;A little help with walking and/or transfers;A little help with bathing/dressing/bathroom   Can travel by private vehicle     Yes  Equipment Recommendations  Rolling walker (2 wheels);BSC/3in1    Recommendations for Other Services       Precautions / Restrictions Precautions Precautions: Fall Precaution Comments: COVID     Mobility  Bed Mobility Overal bed mobility: Needs Assistance Bed Mobility: Supine to Sit, Sit to Supine     Supine to sit: Contact guard Sit to supine: Contact guard assist   General bed mobility comments: CGA for safety; cues to wait for therapist with standning    Transfers Overall transfer level: Needs assistance Equipment used: [employer] Transfers: Sit to/from Stand Sit to Stand: Min assist            General transfer comment: Min A to steady; performed x 3    Ambulation/Gait Ambulation/Gait assistance: Min assist, Mod assist Gait Distance (Feet): 250 Feet Assistive device: Rolling walker (2 wheels) Gait Pattern/deviations: Step-through pattern, Decreased stride length       General Gait Details: Able to ambulate increased distance but requiring min A for balance at times with RW.  When using restroom , pt would leave RW behind requiring cues for RW.  Without RW he required mod A to maintain balance   Stairs             Wheelchair Mobility     Tilt Bed    Modified Rankin (Stroke Patients Only)       Balance                                            Cognition Arousal: Alert Behavior During Therapy: Impulsive Overall Cognitive Status: Impaired/Different from baseline Area of Impairment: Orientation, Attention, Memory, Following commands, Safety/judgement, Awareness, Problem solving                 Orientation Level: Disoriented to, Place, Time, Situation (Stated "in hallway" -not able to say at hospital) Current Attention Level: Sustained Memory: Decreased recall of precautions, Decreased short-term memory Following Commands: Follows one step commands with increased time, Follows multi-step commands inconsistently Safety/Judgement: Decreased awareness of safety, Decreased awareness of deficits Awareness: Intellectual Problem Solving: Slow processing, Decreased initiation, Difficulty sequencing, Requires verbal cues  General Comments: Frequent cues for safety; leaves RW behind        Exercises      General Comments General comments (skin integrity, edema, etc.): Frequent cues for RW and safety needed      Pertinent Vitals/Pain Pain Assessment Pain Assessment: No/denies pain    Home Living                          Prior Function            PT Goals (current goals can now be found in the care plan section)  Progress towards PT goals: Progressing toward goals    Frequency    Min 1X/week      PT Plan      Co-evaluation              AM-PAC PT "6 Clicks" Mobility   Outcome Measure  Help needed turning from your back to your side while in a flat bed without using bedrails?: A Little Help needed moving from lying on your back to sitting on the side of a flat bed without using bedrails?: A Little Help needed moving to and from a bed to a chair (including a wheelchair)?: A Little Help needed standing up from a chair using your arms (e.g., wheelchair or bedside chair)?: A Lot (mod cues) Help needed to walk in hospital room?: A Lot Help needed climbing 3-5 steps with a railing? : A Lot 6 Click Score: 15    End of Session Equipment Utilized During Treatment: Gait belt Activity Tolerance: Patient tolerated treatment well Patient left: in bed;with call bell/phone within reach;with bed alarm set Nurse Communication: Mobility status PT Visit Diagnosis: Other abnormalities of gait and mobility (R26.89);Muscle weakness (generalized) (M62.81)     Time: 6213-0865 PT Time Calculation (min) (ACUTE ONLY): 20 min  Charges:    $Gait Training: 8-22 mins PT General Charges $$ ACUTE PT VISIT: 1 Visit                     Anise Salvo, PT Acute Rehab Services [facility] [phone]    Rayetta Humphrey 12/28/2022, 10:58 AM

## 2022-12-28 NOTE — ED Notes (Signed)
Pt consistently attempting to get out of bed. RN having to redirect pt at least once an hour to stay in bed. Pt is a very high fall risk. Fall risk bundle in place. Pt redirectable and following commands, but would be an ideal candidate for tele sitter

## 2022-12-28 NOTE — ED Notes (Addendum)
Pt got out of bed and found wandering in the room. This writer helped return the patient to bed. Pt educated to remain in bed and use the call light for assistance, since he is a fall risk. Fall precautions are in place for the patient.

## 2022-12-28 NOTE — ED Provider Notes (Addendum)
Emergency Medicine Observation Re-evaluation Note  Dennis [name] is a 77 y.o. male, seen on rounds today.  Pt initially presented to the ED for complaints of Fall Currently, the patient is sitting in bed watching TV.  Physical Exam  BP 112/60   Pulse 72   Temp (!) 97.4 F (36.3 C) (Oral)   Resp 18   Ht 5\' 4"  (1.626 m)   Wt 68 kg   SpO2 97%   BMI 25.75 kg/m  Physical Exam General: No acute distress Cardiac: Normal rate Lungs: No increased work of breathing Psych: Calm and conversive  ED Course / MDM  EKG:   I have reviewed the labs performed to date as well as medications administered while in observation.  Recent changes in the last 24 hours include none.  Plan  Current plan is for awaiting nursing home placement.  Per recent notes, he will need a 10-day quarantine for his COVID prior to placement unfortunately.    Dennis Bucco, MD 12/28/22 1736    Dennis Bucco, MD 12/28/22 1740

## 2022-12-28 NOTE — ED Notes (Signed)
Pt had to be redirected back into bed/ pt continues to get out of bed but is easily redirected/ non-skid socks , floor mat and fall risk bracelet still in place

## 2022-12-28 NOTE — ED Notes (Signed)
MD notified to round on pt

## 2022-12-28 NOTE — ED Provider Notes (Signed)
Patient's neighbor stopped by the emergency department.  Apparently she is taking the patient's wife home and cannot take care of her at home.  She says that she can take Mr. Alvira home as well.  It was decided that she will take the patient's wife home tonight and come back up tomorrow to talk with the social worker.  Patient may be able to be discharged with the neighbor tomorrow.  According to the neighbor, the patient does not have any other family members.   Rolan Bucco, MD 12/28/22 [phone]

## 2022-12-28 NOTE — ED Notes (Signed)
Pt provided new coffee

## 2022-12-29 NOTE — ED Notes (Signed)
Pt continues to get out of bed/ pt is starting to become agitated and restless/ will continue to redirect

## 2022-12-29 NOTE — Discharge Instructions (Signed)
Follow up with your doctor for further evaluation

## 2022-12-29 NOTE — ED Notes (Signed)
Pt continues to get out of bed/ pt redirected back to bed/ safety obvs reordered

## 2022-12-29 NOTE — ED Notes (Signed)
Spoke with wife [name], states she will be sending her neighbor, Kathe Becton, to pick up the patient.

## 2022-12-29 NOTE — ED Notes (Signed)
Pt is looking for squirrels in his room/ pt is getting agitated after this nurse told him there are no squirrels in his room.

## 2022-12-29 NOTE — Progress Notes (Addendum)
1:10pm: CSW received return call from [place] who states she is coming to pick the patient up shortly to take him home.  CSW notified MD and RN of information. CSW requested MD place PT and OT orders for home health.  CSW spoke with Kandee Keen of [facility] who states the agency can accept the patient for PT and OT services. Frances Furbish will follow up with patient and Lecia directly to initiate services.  12:15pm: CSW spoke with [facility] staff regarding patient's girlfriend Hillis Range - CSW was advised that patient's girlfriend returned home yesterday with home health services through [facility]. Lindalou Hose was transported home via her neighbor's personal vehicle [name]).  Per PT note from yesterday, patient was able to ambulate 284ft with a walker.  CSW attempted to reach patient's girlfriend [place] with several phone numbers without success.   CSW spoke with Lecia's daughter Jeronimo Greaves at [phone] who confirms that her mother was discharged yesterday. Tonya advised CSW to contact patient's neighbor [name] at [phone] or Lindalou Hose at [phone] to facilitate discharge home.  CSW attempted to reach [name] without success - a voicemail was left requesting a return call.  Edwin Dada, MSW, LCSW Transitions of Care  Clinical Social Worker II [phone]

## 2022-12-29 NOTE — ED Provider Notes (Signed)
Emergency Medicine Observation Re-evaluation Note  Dennis [name] is a 77 y.o. male, seen on rounds today.  Pt initially presented to the ED for complaints of Fall Currently, the patient is resting, watching tv.  Physical Exam  BP (!) 129/56 (BP Location: Right Arm)   Pulse (!) 56   Temp 97.6 F (36.4 C) (Oral)   Resp (!) 21   Ht 1.626 m (5\' 4" )   Wt 68 kg   SpO2 95%   BMI 25.75 kg/m  Physical Exam General: mad Cardiac: regular rate Lungs: normal effort Psych:   ED Course / MDM  EKG:   I have reviewed the labs performed to date as well as medications administered while in observation.  Recent changes in the last 24 hours include multiple episodes of pt getting up in the middle of the night, requiring re direction for safety.  Plan  Current plan is for possible placement, neighbor possibly offering to take patient home today.    Linwood Dibbles, MD 12/29/22 [phone]

## 2023-01-06 ENCOUNTER — Ambulatory Visit: Admit: 2023-01-06 | Payer: MEDICARE

## 2023-02-04 ENCOUNTER — Ambulatory Visit: Admit: 2023-02-04 | Payer: MEDICARE | Attending: Family | Primary: Family

## 2023-02-21 ENCOUNTER — Emergency Department (HOSPITAL_COMMUNITY)
Admission: EM | Admit: 2023-02-21 | Discharge: 2023-02-21 | Discharge status: Against Medical Advice | Payer: 59 | Source: Home / Self Care

## 2023-02-21 ENCOUNTER — Other Ambulatory Visit: Payer: Self-pay

## 2023-02-21 ENCOUNTER — Emergency Department (HOSPITAL_COMMUNITY): Payer: 59

## 2023-02-21 ENCOUNTER — Ambulatory Visit (HOSPITAL_COMMUNITY)
Admission: EM | Admit: 2023-02-21 | Discharge: 2023-02-21 | Disposition: A | Discharge status: Home / Self Care | Payer: 59 | Attending: Internal Medicine | Admitting: Internal Medicine

## 2023-02-21 ENCOUNTER — Encounter (HOSPITAL_COMMUNITY): Payer: Self-pay

## 2023-02-21 DIAGNOSIS — R4182 Altered mental status, unspecified: Secondary | ICD-10-CM

## 2023-02-21 DIAGNOSIS — R4701 Aphasia: Secondary | ICD-10-CM | POA: Insufficient documentation

## 2023-02-21 DIAGNOSIS — R410 Disorientation, unspecified: Secondary | ICD-10-CM | POA: Insufficient documentation

## 2023-02-21 DIAGNOSIS — Z5321 Procedure and treatment not carried out due to patient leaving prior to being seen by health care provider: Secondary | ICD-10-CM | POA: Insufficient documentation

## 2023-02-21 DIAGNOSIS — F0390 Unspecified dementia without behavioral disturbance: Secondary | ICD-10-CM | POA: Diagnosis not present

## 2023-02-21 LAB — COMPREHENSIVE METABOLIC PANEL
ALT: 17 U/L (ref 0–44)
AST: 21 U/L (ref 15–41)
Albumin: 3.5 g/dL (ref 3.5–5.0)
Alkaline Phosphatase: 34 U/L — ABNORMAL LOW (ref 38–126)
Anion gap: 6 (ref 5–15)
BUN: 15 mg/dL (ref 8–23)
CO2: 30 mmol/L (ref 22–32)
Calcium: 11.6 mg/dL — ABNORMAL HIGH (ref 8.9–10.3)
Chloride: 102 mmol/L (ref 98–111)
Creatinine, Ser: 1.17 mg/dL (ref 0.61–1.24)
GFR, Estimated: >60 mL/min (ref >60)
Glucose, Bld: 101 mg/dL — ABNORMAL HIGH (ref 70–99)
Potassium: 4.3 mmol/L (ref 3.5–5.1)
Sodium: 138 mmol/L (ref 135–145)
Total Bilirubin: 0.6 mg/dL (ref 0.3–1.2)
Total Protein: 7.5 g/dL (ref 6.5–8.1)

## 2023-02-21 LAB — I-STAT CHEM 8, ED
BUN: 17 mg/dL (ref 8–23)
Calcium, Ion: 1.46 mmol/L — ABNORMAL HIGH (ref 1.15–1.40)
Chloride: 103 mmol/L (ref 98–111)
Creatinine, Ser: 1.2 mg/dL (ref 0.61–1.24)
Glucose, Bld: 95 mg/dL (ref 70–99)
HCT: 44 % (ref 39.0–52.0)
Hemoglobin: 15 g/dL (ref 13.0–17.0)
Potassium: 4.4 mmol/L (ref 3.5–5.1)
Sodium: 140 mmol/L (ref 135–145)
TCO2: 29 mmol/L (ref 22–32)

## 2023-02-21 LAB — DIFFERENTIAL
Abs Immature Granulocytes: 0.02 10*3/uL (ref 0.00–0.07)
Basophils Absolute: 0.1 10*3/uL (ref 0.0–0.1)
Basophils Relative: 1 %
Eosinophils Absolute: 0.2 10*3/uL (ref 0.0–0.5)
Eosinophils Relative: 2 %
Immature Granulocytes: 0 %
Lymphocytes Relative: 31 %
Lymphs Abs: 2.6 10*3/uL (ref 0.7–4.0)
Monocytes Absolute: 1 10*3/uL (ref 0.1–1.0)
Monocytes Relative: 12 %
Neutro Abs: 4.4 10*3/uL (ref 1.7–7.7)
Neutrophils Relative %: 54 %

## 2023-02-21 LAB — CBC
HCT: 43.2 % (ref 39.0–52.0)
Hemoglobin: 13.4 g/dL (ref 13.0–17.0)
MCH: 28.6 pg (ref 26.0–34.0)
MCHC: 31 g/dL (ref 30.0–36.0)
MCV: 92.3 fL (ref 80.0–100.0)
Platelets: 307 10*3/uL (ref 150–400)
RBC: 4.68 MIL/uL (ref 4.22–5.81)
RDW: 14.3 % (ref 11.5–15.5)
WBC: 8.1 10*3/uL (ref 4.0–10.5)
nRBC: 0 % (ref 0.0–0.2)

## 2023-02-21 LAB — APTT: aPTT: 27 s (ref 24–36)

## 2023-02-21 LAB — PROTIME-INR
INR: 1 (ref 0.8–1.2)
Prothrombin Time: 13 s (ref 11.4–15.2)

## 2023-02-21 LAB — ETHANOL: Alcohol, Ethyl (B): <10 mg/dL (ref <10)

## 2023-02-21 MED ORDER — SODIUM CHLORIDE 0.9% FLUSH: 3.0000 mL | Freq: Once | INTRAVENOUS | Status: DC

## 2023-02-21 NOTE — ED Notes (Signed)
Also attempted to call spouse phone number with no answer

## 2023-02-21 NOTE — ED Triage Notes (Signed)
Pt woke up today disoriented, about 3 hours ago. LKN 9pm last night when went to bed. Pt unable to tell me what month it is, wife states that he normally knows that. Pt states that he is dizzy.

## 2023-02-21 NOTE — ED Provider Notes (Signed)
[facility]    CSN: [id number] Arrival date & time: 02/21/23  1154      History   Chief Complaint Chief Complaint  Patient presents with   Altered Mental Status    HPI Dennis [name] is a 77 y.o. male.    Altered Mental Status Associated symptoms: weakness   Associated symptoms: no headaches   Dizziness noted upon waking this morning, wife states patient has been unlike his normal self, decreased activity, unsteady on his feet, confused.  Last known well last p.m. Denies paresthesias, chest pain, shortness of breath nausea, vomiting, recent injury, change in vision, difficulty swallowing.  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 Authorizing Provider  atorvastatin (LIPITOR) 40 MG tablet Take 1 tablet by mouth daily. 07/12/20   [provider]  carvedilol (COREG) 3.125 MG tablet Take by mouth.    [provider]  cefdinir (OMNICEF) 300 MG capsule Take 300 mg by mouth 2 (two) times daily. 10/03/22   [provider]  clobetasol ointment (TEMOVATE) 0.05 % Apply 1 Application topically 2 (two) times daily. For 2 weeks, then stop for 1 week. Repeat as needed. 10/08/22 10/08/23  [provider]  clonazePAM (KLONOPIN) 0.5 MG tablet Take by mouth.    [provider]  clopidogrel (PLAVIX) 75 MG tablet Take by mouth.    [provider]  doxycycline (VIBRAMYCIN) 100 MG capsule Take 1 capsule (100 mg total) by mouth 2 (two) times daily. 06/26/22   Tomi Bamberger, PA-C  finasteride  (PROSCAR) 5 MG tablet Take by mouth.    [provider]  folic acid (FOLVITE) 1 MG tablet Take 1 mg by mouth daily. 08/02/20   [provider]  gabapentin (NEURONTIN) 100 MG capsule Take 100 mg by mouth 3 (three) times daily. 11/08/20   [provider]  lidocaine (XYLOCAINE) 2 % solution Use as directed 15 mLs in the mouth or throat every 6 (six) hours as needed for mouth pain.    [provider]  losartan (COZAAR) 25 MG tablet Take 1 tablet by mouth daily. 08/15/20   [provider]  methotrexate 50 MG/2ML injection Inject 30 mg/m2 into the skin once a week. 12/09/22   [provider]  mirtazapine (REMERON) 15 MG tablet TAKE 1/2 (ONE-HALF) TABLET BY MOUTH AT BEDTIME FOR ANXIETY 01/06/20   [provider]  omeprazole (PRILOSEC) 20 MG capsule Take 20 mg by mouth 2 (two) times daily. 09/24/20   [provider]  sulfaSALAzine (AZULFIDINE) 500 MG tablet Take by mouth. 10/25/20 01/23/21  [provider]  tamsulosin (FLOMAX) 0.4 MG CAPS capsule Take by mouth.    [provider]  traMADol (ULTRAM) 50 MG tablet Take by mouth.    [provider]  triamcinolone cream (KENALOG) 0.1 % Apply 1 application topically 2 (two) times daily. 11/24/20   Wieters, Junius Creamer, PA-C    Family History No family history on file.  Social History Social History  Tobacco Use   Smoking status: Former   Smokeless tobacco: Never  Vaping Use   Vaping status: Never Used  Substance Use Topics   Alcohol use: No   Drug use: No     Allergies   Codeine and Shellfish allergy   Review of Systems Review of Systems  HENT:  Negative for voice change.   Cardiovascular:  Negative for chest pain.  Musculoskeletal:  Positive for gait problem.  Neurological:  Positive for dizziness, speech difficulty and weakness. Negative for facial asymmetry, numbness and headaches.     Physical Exam Triage Vital Signs ED Triage Vitals [02/21/23  1204]  Encounter Vitals Group     BP (!) 165/89     Systolic BP Percentile      Diastolic BP Percentile      Pulse Rate 78     Resp 18     Temp      Temp src      SpO2 99 %     Weight      Height      Head Circumference      Peak Flow      Pain Score 0     Pain Loc      Pain Education      Exclude from Growth Chart    No data found.  Updated Vital Signs BP (!) 165/89 (BP Location: Right Arm)   Pulse 78   Resp 18   SpO2 99%   Visual Acuity Right Eye Distance:   Left Eye Distance:   Bilateral Distance:    Right Eye Near:   Left Eye Near:    Bilateral Near:     Physical Exam Vitals and nursing note reviewed.  HENT:     Head: Normocephalic and atraumatic.  Eyes:     Extraocular Movements: Extraocular movements intact.  Pulmonary:     Effort: Pulmonary effort is normal. No respiratory distress.  Skin:    General: Skin is warm and dry.  Neurological:     Mental Status: He is alert. He is disoriented.     Coordination: Coordination normal.     Comments: Disoriented to time, mildly confused      UC Treatments / Results  Labs (all labs ordered are listed, but only abnormal results are displayed) Labs Reviewed - No data to display  EKG   Radiology No results found.  Procedures Procedures (including critical care time)  Medications Ordered in UC Medications - No data to display  Initial Impression / Assessment and Plan / UC Course  I have reviewed the triage vital signs and the nursing notes.  Pertinent labs & imaging results that were available during my care of the patient were reviewed by me and considered in my medical decision making (see chart for details).    77 year old male with dizziness, confusion, unsteadiness.  Concern for CVA, last known well 9 PM, woke at 7 AM ,sent patient to ED for emergent evaluation Final Clinical Impressions(s) / UC Diagnoses   Final diagnoses:  Altered mental status, unspecified altered mental status type    Discharge Instructions   None    ED Prescriptions   None    PDMP not reviewed this encounter.   Meliton Rattan, [place] 02/21/23 1226

## 2023-02-21 NOTE — ED Notes (Signed)
Called [facility] Charge nurse and made aware patient coming POV with wife.

## 2023-02-21 NOTE — ED Triage Notes (Signed)
Per pt's wife, pt woke up with confusion at 0600 today. Pt LKW 02/20/23 at 2300. Pt has receptive aphasia. Pt is A&Ox2 to self and place.

## 2023-02-21 NOTE — ED Notes (Signed)
Patient is being discharged from the Urgent Care and sent to the Emergency Department via POV with wife. Per Meliton Rattan, PA, patient is in need of higher level of care due to disorientation, dizziness. Patient is aware and verbalizes understanding of plan of care.  Vitals:   02/21/23 1204  BP: (!) 165/89  Pulse: 78  Resp: 18  SpO2: 99%

## 2023-02-21 NOTE — ED Notes (Signed)
No answer when called from the lobby. 

## 2023-02-22 ENCOUNTER — Inpatient Hospital Stay (HOSPITAL_COMMUNITY)
Admission: EM | Admit: 2023-02-22 | Discharge: 2023-03-16 | DRG: 884 | Disposition: A | Discharge status: Home Health | Payer: 59 | Attending: Internal Medicine | Admitting: Internal Medicine

## 2023-02-22 ENCOUNTER — Other Ambulatory Visit: Payer: Self-pay

## 2023-02-22 ENCOUNTER — Encounter (HOSPITAL_COMMUNITY): Payer: Self-pay | Admitting: *Deleted

## 2023-02-22 ENCOUNTER — Emergency Department (HOSPITAL_COMMUNITY): Payer: 59

## 2023-02-22 DIAGNOSIS — J449 Chronic obstructive pulmonary disease, unspecified: Secondary | ICD-10-CM | POA: Diagnosis present

## 2023-02-22 DIAGNOSIS — R262 Difficulty in walking, not elsewhere classified: Secondary | ICD-10-CM | POA: Diagnosis not present

## 2023-02-22 DIAGNOSIS — R4182 Altered mental status, unspecified: Secondary | ICD-10-CM | POA: Diagnosis present

## 2023-02-22 DIAGNOSIS — E876 Hypokalemia: Secondary | ICD-10-CM | POA: Diagnosis present

## 2023-02-22 DIAGNOSIS — Z8673 Personal history of transient ischemic attack (TIA), and cerebral infarction without residual deficits: Secondary | ICD-10-CM | POA: Diagnosis not present

## 2023-02-22 DIAGNOSIS — R569 Unspecified convulsions: Secondary | ICD-10-CM | POA: Diagnosis not present

## 2023-02-22 DIAGNOSIS — Z885 Allergy status to narcotic agent status: Secondary | ICD-10-CM | POA: Diagnosis not present

## 2023-02-22 DIAGNOSIS — R9431 Abnormal electrocardiogram [ECG] [EKG]: Secondary | ICD-10-CM | POA: Diagnosis not present

## 2023-02-22 DIAGNOSIS — F03918 Unspecified dementia, unspecified severity, with other behavioral disturbance: Secondary | ICD-10-CM | POA: Diagnosis not present

## 2023-02-22 DIAGNOSIS — Z781 Physical restraint status: Secondary | ICD-10-CM | POA: Diagnosis not present

## 2023-02-22 DIAGNOSIS — Z751 Person awaiting admission to adequate facility elsewhere: Secondary | ICD-10-CM | POA: Diagnosis not present

## 2023-02-22 DIAGNOSIS — G4733 Obstructive sleep apnea (adult) (pediatric): Secondary | ICD-10-CM | POA: Diagnosis present

## 2023-02-22 DIAGNOSIS — F05 Delirium due to known physiological condition: Secondary | ICD-10-CM | POA: Diagnosis present

## 2023-02-22 DIAGNOSIS — K2270 Barrett's esophagus without dysplasia: Secondary | ICD-10-CM | POA: Diagnosis present

## 2023-02-22 DIAGNOSIS — R410 Disorientation, unspecified: Secondary | ICD-10-CM | POA: Diagnosis not present

## 2023-02-22 DIAGNOSIS — N400 Enlarged prostate without lower urinary tract symptoms: Secondary | ICD-10-CM | POA: Diagnosis present

## 2023-02-22 DIAGNOSIS — R4701 Aphasia: Secondary | ICD-10-CM | POA: Diagnosis present

## 2023-02-22 DIAGNOSIS — Z91013 Allergy to seafood: Secondary | ICD-10-CM | POA: Diagnosis not present

## 2023-02-22 DIAGNOSIS — Z9049 Acquired absence of other specified parts of digestive tract: Secondary | ICD-10-CM

## 2023-02-22 DIAGNOSIS — Z79899 Other long term (current) drug therapy: Secondary | ICD-10-CM

## 2023-02-22 DIAGNOSIS — I10 Essential (primary) hypertension: Secondary | ICD-10-CM | POA: Diagnosis present

## 2023-02-22 DIAGNOSIS — Z7902 Long term (current) use of antithrombotics/antiplatelets: Secondary | ICD-10-CM

## 2023-02-22 DIAGNOSIS — R260 Ataxic gait: Secondary | ICD-10-CM | POA: Diagnosis present

## 2023-02-22 DIAGNOSIS — N179 Acute kidney failure, unspecified: Secondary | ICD-10-CM | POA: Diagnosis present

## 2023-02-22 DIAGNOSIS — M069 Rheumatoid arthritis, unspecified: Secondary | ICD-10-CM | POA: Diagnosis present

## 2023-02-22 DIAGNOSIS — E785 Hyperlipidemia, unspecified: Secondary | ICD-10-CM | POA: Insufficient documentation

## 2023-02-22 DIAGNOSIS — Z87891 Personal history of nicotine dependence: Secondary | ICD-10-CM | POA: Diagnosis not present

## 2023-02-22 DIAGNOSIS — F0390 Unspecified dementia without behavioral disturbance: Principal | ICD-10-CM | POA: Diagnosis present

## 2023-02-22 DIAGNOSIS — I351 Nonrheumatic aortic (valve) insufficiency: Secondary | ICD-10-CM | POA: Diagnosis present

## 2023-02-22 DIAGNOSIS — M059 Rheumatoid arthritis with rheumatoid factor, unspecified: Secondary | ICD-10-CM

## 2023-02-22 DIAGNOSIS — E860 Dehydration: Secondary | ICD-10-CM | POA: Diagnosis present

## 2023-02-22 DIAGNOSIS — E782 Mixed hyperlipidemia: Secondary | ICD-10-CM | POA: Diagnosis not present

## 2023-02-22 DIAGNOSIS — G039 Meningitis, unspecified: Secondary | ICD-10-CM | POA: Diagnosis not present

## 2023-02-22 DIAGNOSIS — E7801 Familial hypercholesterolemia: Secondary | ICD-10-CM | POA: Diagnosis not present

## 2023-02-22 LAB — URINALYSIS, ROUTINE W REFLEX MICROSCOPIC
Bacteria, UA: NONE SEEN
Bilirubin Urine: NEGATIVE
Glucose, UA: NEGATIVE mg/dL
Ketones, ur: NEGATIVE mg/dL
Leukocytes,Ua: NEGATIVE
Nitrite: NEGATIVE
Protein, ur: 30 mg/dL — AB
Specific Gravity, Urine: 1.018 (ref 1.005–1.030)
pH: 5 (ref 5.0–8.0)

## 2023-02-22 LAB — CREATININE, SERUM
Creatinine, Ser: 1.23 mg/dL (ref 0.61–1.24)
GFR, Estimated: >60 mL/min (ref >60)

## 2023-02-22 LAB — CBC
HCT: 44.2 % (ref 39.0–52.0)
HCT: 40.8 % (ref 39.0–52.0)
Hemoglobin: 13.9 g/dL (ref 13.0–17.0)
Hemoglobin: 13.2 g/dL (ref 13.0–17.0)
MCH: 29.2 pg (ref 26.0–34.0)
MCH: 29 pg (ref 26.0–34.0)
MCHC: 31.4 g/dL (ref 30.0–36.0)
MCHC: 32.4 g/dL (ref 30.0–36.0)
MCV: 92.9 fL (ref 80.0–100.0)
MCV: 89.7 fL (ref 80.0–100.0)
Platelets: 276 10*3/uL (ref 150–400)
Platelets: 232 10*3/uL (ref 150–400)
RBC: 4.76 MIL/uL (ref 4.22–5.81)
RBC: 4.55 MIL/uL (ref 4.22–5.81)
RDW: 14.2 % (ref 11.5–15.5)
RDW: 14 % (ref 11.5–15.5)
WBC: 7.9 10*3/uL (ref 4.0–10.5)
WBC: 9.2 10*3/uL (ref 4.0–10.5)
nRBC: 0 % (ref 0.0–0.2)
nRBC: 0 % (ref 0.0–0.2)

## 2023-02-22 LAB — COMPREHENSIVE METABOLIC PANEL
ALT: 15 U/L (ref 0–44)
AST: 20 U/L (ref 15–41)
Albumin: 3.5 g/dL (ref 3.5–5.0)
Alkaline Phosphatase: 34 U/L — ABNORMAL LOW (ref 38–126)
Anion gap: 11 (ref 5–15)
BUN: 13 mg/dL (ref 8–23)
CO2: 26 mmol/L (ref 22–32)
Calcium: 11.6 mg/dL — ABNORMAL HIGH (ref 8.9–10.3)
Chloride: 102 mmol/L (ref 98–111)
Creatinine, Ser: 1.19 mg/dL (ref 0.61–1.24)
GFR, Estimated: >60 mL/min (ref >60)
Glucose, Bld: 94 mg/dL (ref 70–99)
Potassium: 4 mmol/L (ref 3.5–5.1)
Sodium: 139 mmol/L (ref 135–145)
Total Bilirubin: 0.3 mg/dL (ref 0.3–1.2)
Total Protein: 7.4 g/dL (ref 6.5–8.1)

## 2023-02-22 LAB — CBG MONITORING, ED: Glucose-Capillary: 101 mg/dL — ABNORMAL HIGH (ref 70–99)

## 2023-02-22 LAB — TSH: TSH: 3.18 u[IU]/mL (ref 0.350–4.500)

## 2023-02-22 MED ORDER — LORAZEPAM 2 MG/ML IJ SOLN
INTRAMUSCULAR | Status: AC
  Filled 2023-02-22: qty 1

## 2023-02-22 MED ORDER — ENOXAPARIN SODIUM 40 MG/0.4ML IJ SOSY
40.0000 mg | PREFILLED_SYRINGE | INTRAMUSCULAR | Status: DC
  Administered 2023-02-22: 40 mg via SUBCUTANEOUS
  Filled 2023-02-22: qty 0.4

## 2023-02-22 MED ORDER — ACETAMINOPHEN 650 MG RE SUPP: 650.0000 mg | Freq: Four times a day (QID) | RECTAL | Status: DC | PRN

## 2023-02-22 MED ORDER — DIPHENHYDRAMINE HCL 50 MG/ML IJ SOLN
INTRAMUSCULAR | Status: AC
  Filled 2023-02-22: qty 1

## 2023-02-22 MED ORDER — STERILE WATER FOR INJECTION IJ SOLN
INTRAMUSCULAR | Status: AC
  Administered 2023-02-22: 1 mL
  Filled 2023-02-22: qty 10

## 2023-02-22 MED ORDER — HALOPERIDOL LACTATE 5 MG/ML IJ SOLN
INTRAMUSCULAR | Status: AC
  Filled 2023-02-22: qty 1

## 2023-02-22 MED ORDER — LORAZEPAM 2 MG/ML IJ SOLN
2.0000 mg | INTRAMUSCULAR | Status: AC
  Administered 2023-02-22: 2 mg via INTRAMUSCULAR

## 2023-02-22 MED ORDER — ONDANSETRON HCL 4 MG/2ML IJ SOLN: 4.0000 mg | Freq: Four times a day (QID) | INTRAMUSCULAR | Status: DC | PRN

## 2023-02-22 MED ORDER — ZIPRASIDONE MESYLATE 20 MG IM SOLR
20.0000 mg | Freq: Once | INTRAMUSCULAR | Status: AC
  Administered 2023-02-22: 20 mg via INTRAMUSCULAR
  Filled 2023-02-22: qty 20

## 2023-02-22 MED ORDER — DIPHENHYDRAMINE HCL 50 MG/ML IJ SOLN
50.0000 mg | Freq: Once | INTRAMUSCULAR | Status: AC
  Administered 2023-02-22: 50 mg via INTRAMUSCULAR

## 2023-02-22 MED ORDER — ACETAMINOPHEN 325 MG PO TABS
650.0000 mg | ORAL_TABLET | Freq: Four times a day (QID) | ORAL | Status: DC | PRN
  Administered 2023-02-28 – 2023-03-08 (×6): 650 mg via ORAL
  Filled 2023-02-22 (×7): qty 2

## 2023-02-22 MED ORDER — SENNOSIDES-DOCUSATE SODIUM 8.6-50 MG PO TABS: 1.0000 | ORAL_TABLET | Freq: Every evening | ORAL | Status: DC | PRN

## 2023-02-22 MED ORDER — HALOPERIDOL LACTATE 5 MG/ML IJ SOLN
5.0000 mg | Freq: Once | INTRAMUSCULAR | Status: AC
  Administered 2023-02-22: 5 mg via INTRAMUSCULAR

## 2023-02-22 MED ORDER — LORAZEPAM 1 MG PO TABS: 0.5000 mg | ORAL_TABLET | Freq: Once | ORAL | Status: DC

## 2023-02-22 MED ORDER — ONDANSETRON HCL 4 MG PO TABS: 4.0000 mg | ORAL_TABLET | Freq: Four times a day (QID) | ORAL | Status: DC | PRN

## 2023-02-22 MED ORDER — HALOPERIDOL LACTATE 5 MG/ML IJ SOLN
5.0000 mg | Freq: Four times a day (QID) | INTRAMUSCULAR | Status: DC | PRN
  Administered 2023-02-22: 5 mg via INTRAVENOUS
  Filled 2023-02-22: qty 1

## 2023-02-22 NOTE — ED Notes (Signed)
CT informed staff that the Pt had not gone to CT because the Pt stated, "my brain is great" and refused to go.  Paramedic went back into the room and discussed with the Pt.  Pt agreed to go. Ct was informed.

## 2023-02-22 NOTE — ED Provider Notes (Signed)
  Physical Exam  BP 133/85 (BP Location: Right Arm)   Pulse 74   Temp 97.6 F (36.4 C) (Axillary)   Resp 20   Ht 5\' 4"  (1.626 m)   Wt 68 kg   SpO2 93%   BMI 25.73 kg/m   Physical Exam  Procedures  Procedures  ED Course / MDM   Clinical Course as of 02/23/23 1122  Sat Feb 22, 2023  1559 Assumed care from Dr Rhunette Croft. 77 yo M with HTN and HLD who presented with change in his baseline. He can't give hx. Wife says that yesterday afternoon he started having some speech disturbance and confusion with unsteady gait (though uses walker at baseline). Went to UC yesterday with LKW at 9pm on 10/3. UC referred here but they left yesterday due to long wait times. Some difficulty with finger to nose but no other focal neuro deficits. Speech improved but still not totally normal. Starts talking in tangents occasionally. Labs and UA is fine. Pending MRI and neuro consult regardless of MRI results.  [RP]  2048 Patient's wife left the emergency department.  Afterwards he attempted to run out of the emergency department.  Started cursing and swinging at staff threatening to punch them.  Patient was escorted back to his room.  Was given Haldol, Benadryl, and Ativan intramuscularly but was still agitated.  Eventually required restraints and intramuscular Geodon.  Patient then was able to be calmed down.  Head CT without acute abnormality.  Unfortunately due to the patient's agitation and did not feel that it would see safe for him to obtain MRI at this time.  Will likely need to reattempt MRI tomorrow when he is calm.  Do suspect that a large portion of this is sundowning.  Dr Joneen Roach from hospitalist to admit the patient for his altered mental status [RP]  2117 Dr Wilford Corner from neurology consulted.  Will continue to follow patient.  Does not recommend any acute interventions. [RP]    Clinical Course User Index [RP] Rondel Baton, MD   Medical Decision Making Amount and/or Complexity of Data  Reviewed Labs: ordered. Radiology: ordered.  Risk Prescription drug management. Decision regarding hospitalization.   CRITICAL CARE Performed by: Rondel Baton   Total critical care time: 45 minutes  Critical care time was exclusive of separately billable procedures and treating other patients.  Critical care was necessary to treat or prevent imminent or life-threatening deterioration.  Critical care was time spent personally by me on the following activities: development of treatment plan with patient and/or surrogate as well as nursing, discussions with consultants, evaluation of patient's response to treatment, examination of patient, obtaining history from patient or surrogate, ordering and performing treatments and interventions, ordering and review of laboratory studies, ordering and review of radiographic studies, pulse oximetry and re-evaluation of patient's condition.     Rondel Baton, MD 02/23/23 1122

## 2023-02-22 NOTE — ED Notes (Signed)
Pt was ambulated.  Pt was unsteady on feet.  Dr. Informed via secure chat.

## 2023-02-22 NOTE — H&P (Incomplete)
PCP:   Pcp, No   Chief Complaint:  Altered mentation  HPI: This is a 77 year old male with past medical history of HLD, BPH, HTN, rheumatoid arthritis, COPD, Barrett's esophagus, OSA does not use CPAP, history of CVA.  Patient sent in for altered mentation.  At baseline patient has a ataxic gait but is not confused.  Over the course of the past week he has become increasingly confused.  He was brought to the ER.  In the ER patient remains quite confused.  At a a point attempted to run down the hall in an attempt to escape.  Patient IVC, with one-to-one sitter.  Patient with restraints.  Vitals stable.  TSH normal, HIV nonreactive creatinine 1.33, baseline normal.  CT head normal.  Medical admission has been requested due to patient ataxic gait.  MRI head ordered and pending.  UA ordered but not obtained.  Review of Systems:  Unable to obtain due to patient's confusion  Past Medical History: Past Medical History:  Diagnosis Date   Arthritis    Hypertension    Past Surgical History:  Procedure Laterality Date   CHOLECYSTECTOMY     THROAT SURGERY      Medications: Prior to Admission medications   Medication Sig Start Date End Date Taking? Authorizing Provider  atorvastatin (LIPITOR) 40 MG tablet Take 1 tablet by mouth daily. 07/12/20   [provider]  carvedilol (COREG) 3.125 MG tablet Take by mouth.    [provider]  cefdinir (OMNICEF) 300 MG capsule Take 300 mg by mouth 2 (two) times daily. 10/03/22   [provider]  clobetasol ointment (TEMOVATE) 0.05 % Apply 1 Application topically 2 (two) times daily. For 2 weeks, then stop for 1 week. Repeat as needed. 10/08/22 10/08/23  [provider]  clonazePAM (KLONOPIN) 0.5 MG tablet Take by mouth.    [provider]  clopidogrel (PLAVIX) 75 MG tablet Take by mouth.    [provider]  doxycycline (VIBRAMYCIN) 100 MG capsule Take 1 capsule (100 mg total) by mouth 2 (two) times daily.  06/26/22   Tomi Bamberger, PA-C  finasteride (PROSCAR) 5 MG tablet Take by mouth.    [provider]  folic acid (FOLVITE) 1 MG tablet Take 1 mg by mouth daily. 08/02/20   [provider]  gabapentin (NEURONTIN) 100 MG capsule Take 100 mg by mouth 3 (three) times daily. 11/08/20   [provider]  lidocaine (XYLOCAINE) 2 % solution Use as directed 15 mLs in the mouth or throat every 6 (six) hours as needed for mouth pain.    [provider]  losartan (COZAAR) 25 MG tablet Take 1 tablet by mouth daily. 08/15/20   [provider]  methotrexate 50 MG/2ML injection Inject 30 mg/m2 into the skin once a week. 12/09/22   [provider]  mirtazapine (REMERON) 15 MG tablet TAKE 1/2 (ONE-HALF) TABLET BY MOUTH AT BEDTIME FOR ANXIETY 01/06/20   [provider]  omeprazole (PRILOSEC) 20 MG capsule Take 20 mg by mouth 2 (two) times daily. 09/24/20   [provider]  sulfaSALAzine (AZULFIDINE) 500 MG tablet Take by mouth. 10/25/20 01/23/21  [provider]  tamsulosin (FLOMAX) 0.4 MG CAPS capsule Take by mouth.    [provider]  traMADol (ULTRAM) 50 MG tablet Take by mouth.    [provider]  triamcinolone cream (KENALOG) 0.1 % Apply 1 application topically 2 (two) times daily. 11/24/20   Wieters, Hallie C, PA-C    Allergies:  Allergies  Allergen Reactions   Codeine    Shellfish Allergy     Social History:  reports that he has quit smoking. His smoking use included cigarettes. He has never used smokeless tobacco. He reports that he does not drink alcohol and does not use drugs.  Family History: History reviewed. No pertinent family history.  Physical Exam: Vitals:   02/22/23 1930 02/22/23 1945 02/22/23 1947 02/22/23 2000  BP: (!) 196/102 (!) 176/98  (!) 149/84  Pulse: 97 (!) 114  (!) 104  Resp: 15 (!) 24  (!) 22  Temp:   98.5 F (36.9 C)   TempSrc:   Oral   SpO2: 94% 92%  94%  Weight:      Height:         General: Confused male, well developed and nourished, in restraints.  Sedated patient Eyes: Pink conjunctiva, no scleral icterus ENT: Moist oral mucosa, neck supple, no thyromegaly Lungs: CTA B/L, no wheeze, no crackles, no use of accessory muscles Cardiovascular: r RRR, no regurgitation, no gallops, no murmurs. No carotid bruits, no JVD Abdomen: soft, positive BS, NTND, no organomegaly, not an acute abdomen GU: not examined Neuro: CN II - XII appears grossly intact, however unable to properly assess due to patient's confusion and sedation. Musculoskeletal: strength 5/5 all extremities, no edema.  Unable to properly assess due to patient's confusion and sedation Skin: no rash, no subcutaneous crepitation, no decubitus Psych: appropriate patient   Labs on Admission:  Recent Labs    02/21/23 1254 02/21/23 1304 02/22/23 1332  NA 138 140 139  K 4.3 4.4 4.0  CL 102 103 102  CO2 30  --  26  GLUCOSE 101* 95 94  BUN 15 17 13   CREATININE 1.17 1.20 1.19  CALCIUM 11.6*  --  11.6*   Recent Labs    02/21/23 1254 02/22/23 1332  AST 21 20  ALT 17 15  ALKPHOS 34* 34*  BILITOT 0.6 0.3  PROT 7.5 7.4  ALBUMIN 3.5 3.5   No results for input(s): "LIPASE", "AMYLASE" in the last 72 hours. Recent Labs    02/21/23 1254 02/21/23 1304 02/22/23 1332  WBC 8.1  --  7.9  NEUTROABS 4.4  --   --   HGB 13.4 15.0 13.9  HCT 43.2 44.0 44.2  MCV 92.3  --  92.9  PLT 307  --  276     Radiological Exams on Admission: CT Head Wo Contrast  Result Date: 02/22/2023 CLINICAL DATA:  Altered mental status EXAM: CT HEAD WITHOUT CONTRAST TECHNIQUE: Contiguous axial images were obtained from the base of the skull through the vertex without intravenous contrast. RADIATION DOSE REDUCTION: This exam was performed according to the departmental dose-optimization program which includes automated exposure control, adjustment of the mA and/or kV according to patient size and/or use of iterative reconstruction  technique. COMPARISON:  CT 12/25/2022, 02/21/2023 FINDINGS: Brain: No acute territorial infarction, hemorrhage or intracranial mass. Atrophy and advanced chronic small vessel ischemic changes of the white matter. More focal right posterior frontal hypodensity, series 2, image 21, also chronic. Age indeterminate lacunar infarct in the head of left caudate, suspect chronic. Ventricles are stable in size. Vascular: No hyperdense vessels. Vertebral and carotid vascular calcification Skull: Normal. Negative for fracture or focal lesion. Sinuses/Orbits: No acute finding. Other: None IMPRESSION: 1. No CT evidence for acute intracranial abnormality. 2. Atrophy and advanced chronic small vessel ischemic changes of the white matter. Age indeterminate lacunar infarct within the left caudate head. Electronically Signed  By: Jasmine Pang M.D.   On: 02/22/2023 18:01   CT HEAD WO CONTRAST  Result Date: 02/21/2023 CLINICAL DATA:  Altered mental status, stroke suspected EXAM: CT HEAD WITHOUT CONTRAST TECHNIQUE: Contiguous axial images were obtained from the base of the skull through the vertex without intravenous contrast. RADIATION DOSE REDUCTION: This exam was performed according to the departmental dose-optimization program which includes automated exposure control, adjustment of the mA and/or kV according to patient size and/or use of iterative reconstruction technique. COMPARISON:  12/25/2022 FINDINGS: Brain: No evidence of acute infarction, hemorrhage, hydrocephalus, extra-axial collection or mass lesion/mass effect. Periventricular and deep white matter hypodensity. Vascular: No hyperdense vessel or unexpected calcification. Skull: Normal. Negative for fracture or focal lesion. Sinuses/Orbits: No acute finding. Other: None. IMPRESSION: No acute intracranial pathology. Small-vessel white matter disease. MRI may be used to more sensitively evaluate for acute diffusion restricting infarction if clinically suspected.  Electronically Signed   By: Jearld Lesch M.D.   On: 02/21/2023 15:36    Assessment/Plan Present on Admission: Altered mental status/wide-based gait -MRI head ordered, Haldol to be given prior to imaging. -UA ordered -Patient IVC, continue one-to-one sitter -Neuroconsult placed.  Will see patient after MRI done -Ammonia, RPR level ordered -Presentation not strokelike in appearance -Haldol as needed -Attempts made to contact spouse and neighbor without success.  Will reattempt in a.m. -PT consult placed  Rheumatoid arthritis -No evidence of infection however given patient is on methotrexate and sulfasalazine.  Will continue to look blood cultures.  Await UA, UTI could cause confusion.  Patient's medications have not been clarified as I am unable to contact family.  Therefore it is unclear if patient is on these medications -Continue folic acid.  BPH -Will start Proscar and Flomax  HTN Will start Coreg and Cozaar  HLD -All other medications on hold until clarified by family   Tyrel Lex 02/22/2023, 8:52 PM

## 2023-02-22 NOTE — ED Provider Notes (Signed)
[place] EMERGENCY DEPARTMENT AT [facility] Provider Note   CSN: [id number] Arrival date & time: 02/22/23  1257     History {Add pertinent medical, surgical, social history, OB history to HPI:1} Chief Complaint  Patient presents with   Altered Mental Status    Dennis [name] is a 77 y.o. male.  HPI    77 year old male comes in with chief complaint of altered mental status.  He is accompanied by his wife, who provides substantial part of the history.  Patient has history of hypertension, hyperlipidemia, aortic insufficiency.  He has no history of strokes.  Patient is not the best historian about this event.  Wife is attempting to give history, but is not the best as far as timeline is concerned.  It appears that patient has baseline ataxic gait, but he is very sharp.  Over the last week, patient has had worsening gait.  According to the wife, yesterday patient started having difficulty with speech and confusion sometime in the afternoon.  Today patient was extremely unsteady and had difficulty with ADLs, therefore she brought him into the ER.  Patient states that when he was talking, he was hard to understand and also not making sense.  Upon review of patient's record, it appears that patient had gone to urgent care yesterday and they had advised that patient come to the ER.  Patient left from the triage/waiting room area.  Wife did not give me that part of the history.  The last known normal according to the urgent care was the day prior to yesterday.  According to the urgent care, patient last known normal was 9 PM on 10-3.  Home Medications Prior to Admission medications   Medication Sig Start Date End Date Taking? Authorizing Provider  atorvastatin (LIPITOR) 40 MG tablet Take 1 tablet by mouth daily. 07/12/20   [provider]  carvedilol (COREG) 3.125 MG tablet Take by mouth.    [provider]  cefdinir (OMNICEF) 300 MG capsule Take 300 mg by mouth  2 (two) times daily. 10/03/22   [provider]  clobetasol ointment (TEMOVATE) 0.05 % Apply 1 Application topically 2 (two) times daily. For 2 weeks, then stop for 1 week. Repeat as needed. 10/08/22 10/08/23  [provider]  clonazePAM (KLONOPIN) 0.5 MG tablet Take by mouth.    [provider]  clopidogrel (PLAVIX) 75 MG tablet Take by mouth.    [provider]  doxycycline (VIBRAMYCIN) 100 MG capsule Take 1 capsule (100 mg total) by mouth 2 (two) times daily. 06/26/22   Tomi Bamberger, PA-C  finasteride (PROSCAR) 5 MG tablet Take by mouth.    [provider]  folic acid (FOLVITE) 1 MG tablet Take 1 mg by mouth daily. 08/02/20   [provider]  gabapentin (NEURONTIN) 100 MG capsule Take 100 mg by mouth 3 (three) times daily. 11/08/20   [provider]  lidocaine (XYLOCAINE) 2 % solution Use as directed 15 mLs in the mouth or throat every 6 (six) hours as needed for mouth pain.    [provider]  losartan (COZAAR) 25 MG tablet Take 1 tablet by mouth daily. 08/15/20   [provider]  methotrexate 50 MG/2ML injection Inject 30 mg/m2 into the skin once a week. 12/09/22   [provider]  mirtazapine (REMERON) 15 MG tablet TAKE 1/2 (ONE-HALF) TABLET BY MOUTH AT BEDTIME FOR ANXIETY 01/06/20   [provider]  omeprazole (PRILOSEC) 20 MG capsule Take 20 mg  by mouth 2 (two) times daily. 09/24/20   [provider]  sulfaSALAzine (AZULFIDINE) 500 MG tablet Take by mouth. 10/25/20 01/23/21  [provider]  tamsulosin (FLOMAX) 0.4 MG CAPS capsule Take by mouth.    [provider]  traMADol (ULTRAM) 50 MG tablet Take by mouth.    [provider]  triamcinolone cream (KENALOG) 0.1 % Apply 1 application topically 2 (two) times daily. 11/24/20   Wieters, Hallie C, PA-C      Allergies    Codeine and Shellfish allergy    Review of Systems   Review of Systems  All other systems reviewed  and are negative.   Physical Exam Updated Vital Signs BP (!) 150/99   Pulse 79   Temp 98.4 F (36.9 C)   Resp (!) 25   Ht 5\' 4"  (1.626 m)   Wt 68 kg   SpO2 94%   BMI 25.73 kg/m  Physical Exam Vitals and nursing note reviewed.  Constitutional:      Appearance: He is well-developed.  HENT:     Head: Atraumatic.  Cardiovascular:     Rate and Rhythm: Normal rate.  Pulmonary:     Effort: Pulmonary effort is normal.  Musculoskeletal:     Cervical back: Neck supple.  Skin:    General: Skin is warm.  Neurological:     Mental Status: He is alert and oriented to person, place, and time.     Cranial Nerves: No cranial nerve deficit.     Sensory: No sensory deficit.     Motor: No weakness.     Coordination: Coordination normal.     Gait: Gait abnormal.     Comments: Patient following commands, he is noted to have episodes of expressive aphasia.  Also he had to repeat commands multiple times for him to follow through on neuroexam.     ED Results / Procedures / Treatments   Labs (all labs ordered are listed, but only abnormal results are displayed) Labs Reviewed  COMPREHENSIVE METABOLIC PANEL - Abnormal; Notable for the following components:      Result Value   Calcium 11.6 (*)    Alkaline Phosphatase 34 (*)    All other components within normal limits  URINALYSIS, ROUTINE W REFLEX MICROSCOPIC - Abnormal; Notable for the following components:   Hgb urine dipstick SMALL (*)    Protein, ur 30 (*)    All other components within normal limits  CBG MONITORING, ED - Abnormal; Notable for the following components:   Glucose-Capillary 101 (*)    All other components within normal limits  CBC    EKG EKG Interpretation Date/Time:  Saturday February 22 2023 13:07:15 EDT Ventricular Rate:  91 PR Interval:  154 QRS Duration:  80 QT Interval:  342 QTC Calculation: 420 R Axis:   19  Text Interpretation: Normal sinus rhythm Left ventricular hypertrophy with repolarization  abnormality ( Sokolow-Lyon ) Abnormal ECG When compared with ECG of 21-Feb-2023 12:24, PREVIOUS ECG IS PRESENT inferior and lateal ST depression is not new Confirmed by Derwood Kaplan [phone]) on 02/22/2023 3:54:19 PM  Radiology CT HEAD WO CONTRAST  Result Date: 02/21/2023 CLINICAL DATA:  Altered mental status, stroke suspected EXAM: CT HEAD WITHOUT CONTRAST TECHNIQUE: Contiguous axial images were obtained from the base of the skull through the vertex without intravenous contrast. RADIATION DOSE REDUCTION: This exam was performed according to the departmental dose-optimization program which includes automated exposure control, adjustment of the mA and/or kV according to patient size and/or use  of iterative reconstruction technique. COMPARISON:  12/25/2022 FINDINGS: Brain: No evidence of acute infarction, hemorrhage, hydrocephalus, extra-axial collection or mass lesion/mass effect. Periventricular and deep white matter hypodensity. Vascular: No hyperdense vessel or unexpected calcification. Skull: Normal. Negative for fracture or focal lesion. Sinuses/Orbits: No acute finding. Other: None. IMPRESSION: No acute intracranial pathology. Small-vessel white matter disease. MRI may be used to more sensitively evaluate for acute diffusion restricting infarction if clinically suspected. Electronically Signed   By: Jearld Lesch M.D.   On: 02/21/2023 15:36    Procedures Procedures  {Document cardiac monitor, telemetry assessment procedure when appropriate:1}  Medications Ordered in ED Medications  LORazepam (ATIVAN) tablet 0.5 mg (has no administration in time range)    ED Course/ Medical Decision Making/ A&P   {   Click here for ABCD2, HEART and other calculatorsREFRESH Note before signing :1}                              Medical Decision Making Amount and/or Complexity of Data Reviewed Labs: ordered. Radiology: ordered.  Risk Prescription drug management.   This patient presents to the ED with  chief complaint(s) of altered mental status, worsening dizziness, slurred speech with pertinent past medical history of hypertension, hyperlipidemia.The complaint involves an extensive differential diagnosis and also carries with it a high risk of complications and morbidity.    The differential diagnosis includes  ICH / Stroke, acute coronary syndrome, Infection - UTI/Pneumonia/soft tissue infection leading to encephalopathy, encephalopathy due to electrolyte abnormality or drug interactions or toxins or metabolic conditions like adrenal insufficiency, hyperglycemia, paraneoplastic process  The initial plan is to get CT scan of the brain, basic labs, including urine analysis.   Additional history obtained: Additional history obtained from spouse Records reviewed Care Everywhere/External Records  Independent labs interpretation:  The following labs were independently interpreted: CBC is normal.  Metabolic profile is normal.  At 3:45 PM, patient's care will be signed out to incoming team.  CT scan head, MRI brain pending.  If the results are normal, patient will still need neurology assessment for acute mental status change and possibly admission.    Final Clinical Impression(s) / ED Diagnoses Final diagnoses:  Disorientation    Rx / DC Orders ED Discharge Orders     None

## 2023-02-22 NOTE — ED Triage Notes (Signed)
Patient's "wife" states he was seen at [facility] for confusion and is still confused today, Patient came into the ED yest from [facility] however left before being seen , wife states he wasn't here yest however it is documented that he was, she states he wasn't given any discharged instructions from [facility].

## 2023-02-22 NOTE — ED Notes (Signed)
ED TO INPATIENT HANDOFF REPORT  ED Nurse Name and Phone #: Romilda Joy  S Name/Age/Gender Dennis [name] 77 y.o. male Room/Bed: 028C/028C  Code Status   Code Status: Full Code  Home/SNF/Other Home Patient oriented to: self, Is this baseline? No   Triage Complete: Triage complete  Chief Complaint Altered mental state [R41.82]  Triage Note Patient's "wife" states he was seen at [facility] for confusion and is still confused today, Patient came into the ED yest from [facility] however left before being seen , wife states he wasn't here yest however it is documented that he was, she states he wasn't given any discharged instructions from [facility].    Allergies Allergies  Allergen Reactions   Codeine    Shellfish Allergy     Level of Care/Admitting Diagnosis ED Disposition     ED Disposition  Admit   Condition  --   Comment  Hospital Area: [facility] [[id number]]  Level of Care: Med-Surg [16]  May admit patient to [facility] or [facility] if equivalent level of care is available:: No  Covid Evaluation: Asymptomatic - no recent exposure (last 10 days) testing not required  Diagnosis: Altered mental state [086578]  Admitting Physician: Gery Pray [[id number]]  Attending Physician: Gery Pray [[id number]]  Certification:: I certify this patient will need inpatient services for at least 2 midnights  Expected Medical Readiness: 02/24/2023          B Medical/Surgery History Past Medical History:  Diagnosis Date   Arthritis    Hypertension    Past Surgical History:  Procedure Laterality Date   CHOLECYSTECTOMY     THROAT SURGERY       A IV Location/Drains/Wounds Patient Lines/Drains/Airways Status     Active Line/Drains/Airways     Name Placement date Placement time Site Days   Peripheral IV 02/22/23 20 G Posterior;Right Hand 02/22/23  1927  Hand  less than 1            Intake/Output Last 24 hours No intake or output data in the  24 hours ending 02/22/23 2202  Labs/Imaging Results for orders placed or performed during the hospital encounter of 02/22/23 (from the past 48 hour(s))  Urinalysis, Routine w reflex microscopic -Urine, Clean Catch     Status: Abnormal   Collection Time: 02/22/23  1:27 PM  Result Value Ref Range   Color, Urine YELLOW YELLOW   APPearance CLEAR CLEAR   Specific Gravity, Urine 1.018 1.005 - 1.030   pH 5.0 5.0 - 8.0   Glucose, UA NEGATIVE NEGATIVE mg/dL   Hgb urine dipstick SMALL (A) NEGATIVE   Bilirubin Urine NEGATIVE NEGATIVE   Ketones, ur NEGATIVE NEGATIVE mg/dL   Protein, ur 30 (A) NEGATIVE mg/dL   Nitrite NEGATIVE NEGATIVE   Leukocytes,Ua NEGATIVE NEGATIVE   RBC / HPF 0-5 0 - 5 RBC/hpf   WBC, UA 0-5 0 - 5 WBC/hpf   Bacteria, UA NONE SEEN NONE SEEN   Squamous Epithelial / HPF 0-5 0 - 5 /HPF   Mucus PRESENT    Hyaline Casts, UA PRESENT     Comment: Performed at [facility], [address], [place], [place] [address]  Comprehensive metabolic panel     Status: Abnormal   Collection Time: 02/22/23  1:32 PM  Result Value Ref Range   Sodium 139 135 - 145 mmol/L   Potassium 4.0 3.5 - 5.1 mmol/L   Chloride 102 98 - 111 mmol/L   CO2 26  22 - 32 mmol/L   Glucose, Bld 94 70 - 99 mg/dL    Comment: Glucose reference range applies only to samples taken after fasting for at least 8 hours.   BUN 13 8 - 23 mg/dL   Creatinine, Ser 4.09 0.61 - 1.24 mg/dL   Calcium 81.1 (H) 8.9 - 10.3 mg/dL   Total Protein 7.4 6.5 - 8.1 g/dL   Albumin 3.5 3.5 - 5.0 g/dL   AST 20 15 - 41 U/L   ALT 15 0 - 44 U/L   Alkaline Phosphatase 34 (L) 38 - 126 U/L   Total Bilirubin 0.3 0.3 - 1.2 mg/dL   GFR, Estimated >91 >47 mL/min    Comment: (NOTE) Calculated using the CKD-EPI Creatinine Equation (2021)    Anion gap 11 5 - 15    Comment: Performed at [facility], [address], [place], [place] [address]  CBC     Status: None   Collection Time: 02/22/23  1:32 PM  Result Value Ref Range   WBC 7.9  4.0 - 10.5 K/uL   RBC 4.76 4.22 - 5.81 MIL/uL   Hemoglobin 13.9 13.0 - 17.0 g/dL   HCT 21.3 08.6 - 57.8 %   MCV 92.9 80.0 - 100.0 fL   MCH 29.2 26.0 - 34.0 pg   MCHC 31.4 30.0 - 36.0 g/dL   RDW 46.9 62.9 - 52.8 %   Platelets 276 150 - 400 K/uL   nRBC 0.0 0.0 - 0.2 %    Comment: Performed at [facility], [address] [place], [place] [address]  CBG monitoring, ED     Status: Abnormal   Collection Time: 02/22/23  1:35 PM  Result Value Ref Range   Glucose-Capillary 101 (H) 70 - 99 mg/dL    Comment: Glucose reference range applies only to samples taken after fasting for at least 8 hours.   Comment 1 Notify RN    Comment 2 Document in Chart   CBC     Status: None   Collection Time: 02/22/23  9:28 PM  Result Value Ref Range   WBC 9.2 4.0 - 10.5 K/uL   RBC 4.55 4.22 - 5.81 MIL/uL   Hemoglobin 13.2 13.0 - 17.0 g/dL   HCT 40.1 02.7 - 25.3 %   MCV 89.7 80.0 - 100.0 fL   MCH 29.0 26.0 - 34.0 pg   MCHC 32.4 30.0 - 36.0 g/dL   RDW 66.4 40.3 - 47.4 %   Platelets 232 150 - 400 K/uL   nRBC 0.0 0.0 - 0.2 %    Comment: Performed at [facility], [address] [place], [place] [address]   CT Head Wo Contrast  Result Date: 02/22/2023 CLINICAL DATA:  Altered mental status EXAM: CT HEAD WITHOUT CONTRAST TECHNIQUE: Contiguous axial images were obtained from the base of the skull through the vertex without intravenous contrast. RADIATION DOSE REDUCTION: This exam was performed according to the departmental dose-optimization program which includes automated exposure control, adjustment of the mA and/or kV according to patient size and/or use of iterative reconstruction technique. COMPARISON:  CT 12/25/2022, 02/21/2023 FINDINGS: Brain: No acute territorial infarction, hemorrhage or intracranial mass. Atrophy and advanced chronic small vessel ischemic changes of the white matter. More focal right posterior frontal hypodensity, series 2, image 21, also chronic. Age indeterminate lacunar  infarct in the head of left caudate, suspect chronic. Ventricles are stable in size. Vascular: No hyperdense vessels. Vertebral and carotid vascular calcification Skull: Normal. Negative  for fracture or focal lesion. Sinuses/Orbits: No acute finding. Other: None IMPRESSION: 1. No CT evidence for acute intracranial abnormality. 2. Atrophy and advanced chronic small vessel ischemic changes of the white matter. Age indeterminate lacunar infarct within the left caudate head. Electronically Signed   By: Jasmine Pang M.D.   On: 02/22/2023 18:01   CT HEAD WO CONTRAST  Result Date: 02/21/2023 CLINICAL DATA:  Altered mental status, stroke suspected EXAM: CT HEAD WITHOUT CONTRAST TECHNIQUE: Contiguous axial images were obtained from the base of the skull through the vertex without intravenous contrast. RADIATION DOSE REDUCTION: This exam was performed according to the departmental dose-optimization program which includes automated exposure control, adjustment of the mA and/or kV according to patient size and/or use of iterative reconstruction technique. COMPARISON:  12/25/2022 FINDINGS: Brain: No evidence of acute infarction, hemorrhage, hydrocephalus, extra-axial collection or mass lesion/mass effect. Periventricular and deep white matter hypodensity. Vascular: No hyperdense vessel or unexpected calcification. Skull: Normal. Negative for fracture or focal lesion. Sinuses/Orbits: No acute finding. Other: None. IMPRESSION: No acute intracranial pathology. Small-vessel white matter disease. MRI may be used to more sensitively evaluate for acute diffusion restricting infarction if clinically suspected. Electronically Signed   By: Jearld Lesch M.D.   On: 02/21/2023 15:36    Pending Labs Unresulted Labs (From admission, onward)     Start     Ordered   03/01/23 0500  Creatinine, serum  (enoxaparin (LOVENOX)    CrCl >/= 30 ml/min)  Weekly,   R     Comments: while on enoxaparin therapy    02/22/23 2115   02/23/23 0500   Basic metabolic panel  Tomorrow morning,   R        02/22/23 2115   02/23/23 0500  CBC with Differential/Platelet  Tomorrow morning,   R        02/22/23 2115   02/22/23 2144  C-reactive protein  Add-on,   AD        02/22/23 2143   02/22/23 2143  RPR  Add-on,   AD        02/22/23 2142   02/22/23 2143  HIV Antibody (routine testing w rflx)  (HIV Antibody (Routine testing w reflex) panel)  Once,   R        02/22/23 2142   02/22/23 2143  Sedimentation rate  Add-on,   AD        02/22/23 2143   02/22/23 2142  Rapid urine drug screen (hospital performed)  ONCE - STAT,   STAT        02/22/23 2141   02/22/23 2142  Ammonia  Once,   R        02/22/23 2141   02/22/23 2128  TSH  Once,   R        02/22/23 2128   02/22/23 2115  Creatinine, serum  (enoxaparin (LOVENOX)    CrCl >/= 30 ml/min)  Once,   R       Comments: Baseline for enoxaparin therapy IF NOT ALREADY DRAWN.    02/22/23 2115            Vitals/Pain Today's Vitals   02/22/23 2015 02/22/23 2030 02/22/23 2045 02/22/23 2100  BP: (!) 146/81 (!) 158/84 (!) 149/85 (!) 141/90  Pulse: 100 99 92 88  Resp: (!) 22 (!) 21 18 (!) 21  Temp:      TempSrc:      SpO2: 94% 92% 94% 97%  Weight:      Height:  PainSc:        Isolation Precautions No active isolations  Medications Medications  haloperidol lactate (HALDOL) injection 5 mg (has no administration in time range)  enoxaparin (LOVENOX) injection 40 mg (40 mg Subcutaneous Given 02/22/23 2132)  acetaminophen (TYLENOL) tablet 650 mg (has no administration in time range)    Or  acetaminophen (TYLENOL) suppository 650 mg (has no administration in time range)  senna-docusate (Senokot-S) tablet 1 tablet (has no administration in time range)  ondansetron (ZOFRAN) tablet 4 mg (has no administration in time range)    Or  ondansetron (ZOFRAN) injection 4 mg (has no administration in time range)  haloperidol lactate (HALDOL) injection 5 mg (5 mg Intramuscular Given 02/22/23 1850)   diphenhydrAMINE (BENADRYL) injection 50 mg (50 mg Intramuscular Given 02/22/23 1850)  LORazepam (ATIVAN) injection 2 mg (2 mg Intramuscular Given 02/22/23 1855)  ziprasidone (GEODON) injection 20 mg (20 mg Intramuscular Given 02/22/23 1920)  sterile water (preservative free) injection (1 mL  Given 02/22/23 1920)    Mobility Walks slightly      Focused Assessments    R Recommendations: See Admitting Provider Note  Report given to:   Additional Notes: Pt became combative after being here for hours.  He was restrained and sedated.  Pt has been sleeping ever since without complications

## 2023-02-22 NOTE — ED Notes (Signed)
Transport called.

## 2023-02-22 NOTE — ED Notes (Signed)
Messaged dr about MRI.  Paramedic did not feel it was safe to take the Pt out of restraints for MRI.

## 2023-02-23 ENCOUNTER — Inpatient Hospital Stay (HOSPITAL_COMMUNITY): Payer: 59

## 2023-02-23 DIAGNOSIS — G039 Meningitis, unspecified: Secondary | ICD-10-CM

## 2023-02-23 DIAGNOSIS — R569 Unspecified convulsions: Secondary | ICD-10-CM | POA: Diagnosis not present

## 2023-02-23 DIAGNOSIS — R410 Disorientation, unspecified: Secondary | ICD-10-CM | POA: Diagnosis not present

## 2023-02-23 LAB — CBC WITH DIFFERENTIAL/PLATELET
Abs Immature Granulocytes: 0.04 10*3/uL (ref 0.00–0.07)
Abs Immature Granulocytes: 0.03 10*3/uL (ref 0.00–0.07)
Basophils Absolute: 0 10*3/uL (ref 0.0–0.1)
Basophils Absolute: 0.1 10*3/uL (ref 0.0–0.1)
Basophils Relative: 0 %
Basophils Relative: 1 %
Eosinophils Absolute: 0 10*3/uL (ref 0.0–0.5)
Eosinophils Absolute: 0 10*3/uL (ref 0.0–0.5)
Eosinophils Relative: 0 %
Eosinophils Relative: 0 %
HCT: 42.7 % (ref 39.0–52.0)
HCT: 40.6 % (ref 39.0–52.0)
Hemoglobin: 13.6 g/dL (ref 13.0–17.0)
Hemoglobin: 13 g/dL (ref 13.0–17.0)
Immature Granulocytes: 0 %
Immature Granulocytes: 0 %
Lymphocytes Relative: 17 %
Lymphocytes Relative: 23 %
Lymphs Abs: 1.5 10*3/uL (ref 0.7–4.0)
Lymphs Abs: 2 10*3/uL (ref 0.7–4.0)
MCH: 28.6 pg (ref 26.0–34.0)
MCH: 29.9 pg (ref 26.0–34.0)
MCHC: 31.9 g/dL (ref 30.0–36.0)
MCHC: 32 g/dL (ref 30.0–36.0)
MCV: 89.9 fL (ref 80.0–100.0)
MCV: 93.3 fL (ref 80.0–100.0)
Monocytes Absolute: 0.6 10*3/uL (ref 0.1–1.0)
Monocytes Absolute: 1.2 10*3/uL — ABNORMAL HIGH (ref 0.1–1.0)
Monocytes Relative: 7 %
Monocytes Relative: 13 %
Neutro Abs: 7 10*3/uL (ref 1.7–7.7)
Neutro Abs: 5.7 10*3/uL (ref 1.7–7.7)
Neutrophils Relative %: 76 %
Neutrophils Relative %: 63 %
Platelets: 236 10*3/uL (ref 150–400)
Platelets: 228 10*3/uL (ref 150–400)
RBC: 4.75 MIL/uL (ref 4.22–5.81)
RBC: 4.35 MIL/uL (ref 4.22–5.81)
RDW: 13.9 % (ref 11.5–15.5)
RDW: 14 % (ref 11.5–15.5)
WBC: 9.3 10*3/uL (ref 4.0–10.5)
WBC: 9 10*3/uL (ref 4.0–10.5)
nRBC: 0 % (ref 0.0–0.2)
nRBC: 0 % (ref 0.0–0.2)

## 2023-02-23 LAB — PROCALCITONIN: Procalcitonin: <0.1 ng/mL

## 2023-02-23 LAB — BASIC METABOLIC PANEL
Anion gap: 12 (ref 5–15)
BUN: 14 mg/dL (ref 8–23)
CO2: 25 mmol/L (ref 22–32)
Calcium: 10.8 mg/dL — ABNORMAL HIGH (ref 8.9–10.3)
Chloride: 103 mmol/L (ref 98–111)
Creatinine, Ser: 1.33 mg/dL — ABNORMAL HIGH (ref 0.61–1.24)
GFR, Estimated: 55 mL/min — ABNORMAL LOW (ref >60)
Glucose, Bld: 122 mg/dL — ABNORMAL HIGH (ref 70–99)
Potassium: 3.4 mmol/L — ABNORMAL LOW (ref 3.5–5.1)
Sodium: 140 mmol/L (ref 135–145)

## 2023-02-23 LAB — URINALYSIS, ROUTINE W REFLEX MICROSCOPIC
Bacteria, UA: NONE SEEN
Bilirubin Urine: NEGATIVE
Glucose, UA: NEGATIVE mg/dL
Hgb urine dipstick: NEGATIVE
Ketones, ur: NEGATIVE mg/dL
Leukocytes,Ua: NEGATIVE
Nitrite: NEGATIVE
Protein, ur: 100 mg/dL — AB
Specific Gravity, Urine: 1.018 (ref 1.005–1.030)
pH: 5 (ref 5.0–8.0)

## 2023-02-23 LAB — BLOOD GAS, ARTERIAL
Acid-Base Excess: 1.3 mmol/L (ref 0.0–2.0)
Bicarbonate: 25.9 mmol/L (ref 20.0–28.0)
O2 Saturation: 95.1 %
Patient temperature: 37
pCO2 arterial: 40 mm[Hg] (ref 32–48)
pH, Arterial: 7.42 (ref 7.35–7.45)
pO2, Arterial: 76 mm[Hg] — ABNORMAL LOW (ref 83–108)

## 2023-02-23 LAB — SEDIMENTATION RATE: Sed Rate: 9 mm/h (ref 0–16)

## 2023-02-23 LAB — HEMOGLOBIN A1C
Hgb A1c MFr Bld: 5.5 % (ref 4.8–5.6)
Mean Plasma Glucose: 111.15 mg/dL

## 2023-02-23 LAB — HIV ANTIBODY (ROUTINE TESTING W REFLEX): HIV Screen 4th Generation wRfx: NONREACTIVE

## 2023-02-23 LAB — RPR: RPR Ser Ql: NONREACTIVE

## 2023-02-23 LAB — VITAMIN B12: Vitamin B-12: 611 pg/mL (ref 180–914)

## 2023-02-23 LAB — C-REACTIVE PROTEIN: CRP: 0.5 mg/dL (ref <1.0)

## 2023-02-23 LAB — RAPID URINE DRUG SCREEN, HOSP PERFORMED
Amphetamines: NOT DETECTED
Barbiturates: NOT DETECTED
Benzodiazepines: POSITIVE — AB
Cocaine: NOT DETECTED
Opiates: NOT DETECTED
Tetrahydrocannabinol: NOT DETECTED

## 2023-02-23 LAB — MRSA NEXT GEN BY PCR, NASAL: MRSA by PCR Next Gen: NOT DETECTED

## 2023-02-23 LAB — AMMONIA: Ammonia: 12 umol/L (ref 9–35)

## 2023-02-23 LAB — FOLATE: Folate: 8.4 ng/mL (ref >5.9)

## 2023-02-23 MED ORDER — SODIUM CHLORIDE 0.9 % IV SOLN
2.0000 g | INTRAVENOUS | Status: DC
  Administered 2023-02-23: 2 g via INTRAVENOUS
  Filled 2023-02-23: qty 20

## 2023-02-23 MED ORDER — HEPARIN SODIUM (PORCINE) 5000 UNIT/ML IJ SOLN
5000.0000 [IU] | Freq: Three times a day (TID) | INTRAMUSCULAR | Status: DC
  Administered 2023-02-23 – 2023-02-24 (×2): 5000 [IU] via SUBCUTANEOUS
  Filled 2023-02-23 (×2): qty 1

## 2023-02-23 MED ORDER — SODIUM CHLORIDE 0.9 % IV SOLN
2.0000 g | Freq: Two times a day (BID) | INTRAVENOUS | Status: DC
  Administered 2023-02-24 – 2023-02-27 (×7): 2 g via INTRAVENOUS
  Filled 2023-02-23 (×7): qty 20

## 2023-02-23 MED ORDER — FINASTERIDE 5 MG PO TABS
5.0000 mg | ORAL_TABLET | Freq: Every day | ORAL | Status: DC
  Administered 2023-02-24 – 2023-03-16 (×21): 5 mg via ORAL
  Filled 2023-02-23 (×21): qty 1

## 2023-02-23 MED ORDER — TAMSULOSIN HCL 0.4 MG PO CAPS
0.4000 mg | ORAL_CAPSULE | Freq: Every day | ORAL | Status: DC
  Administered 2023-02-24 – 2023-03-15 (×19): 0.4 mg via ORAL
  Filled 2023-02-23 (×20): qty 1

## 2023-02-23 MED ORDER — VANCOMYCIN HCL 1500 MG/300ML IV SOLN
1500.0000 mg | Freq: Once | INTRAVENOUS | Status: AC
  Administered 2023-02-24: 1500 mg via INTRAVENOUS
  Filled 2023-02-23: qty 300

## 2023-02-23 MED ORDER — GADOBUTROL 1 MMOL/ML IV SOLN
7.0000 mL | Freq: Once | INTRAVENOUS | Status: AC | PRN
  Administered 2023-02-23: 7 mL via INTRAVENOUS

## 2023-02-23 MED ORDER — HALOPERIDOL LACTATE 5 MG/ML IJ SOLN: 5.0000 mg | Freq: Once | INTRAMUSCULAR | Status: DC

## 2023-02-23 MED ORDER — POTASSIUM CL IN DEXTROSE 5% 20 MEQ/L IV SOLN
20.0000 meq | INTRAVENOUS | Status: DC
  Administered 2023-02-23 (×2): 20 meq via INTRAVENOUS
  Filled 2023-02-23 (×3): qty 1000

## 2023-02-23 MED ORDER — VANCOMYCIN HCL IN DEXTROSE 1-5 GM/200ML-% IV SOLN
1000.0000 mg | INTRAVENOUS | Status: DC
  Administered 2023-02-24 – 2023-02-26 (×3): 1000 mg via INTRAVENOUS
  Filled 2023-02-23 (×3): qty 200

## 2023-02-23 MED ORDER — PANTOPRAZOLE SODIUM 40 MG IV SOLR
40.0000 mg | INTRAVENOUS | Status: DC
  Administered 2023-02-23 – 2023-02-25 (×3): 40 mg via INTRAVENOUS
  Filled 2023-02-23 (×2): qty 10

## 2023-02-23 MED ORDER — SODIUM CHLORIDE 0.9 % IV SOLN
2.0000 g | Freq: Four times a day (QID) | INTRAVENOUS | Status: DC
  Administered 2023-02-24 – 2023-02-27 (×14): 2 g via INTRAVENOUS
  Filled 2023-02-23 (×14): qty 2000

## 2023-02-23 MED ORDER — CLOPIDOGREL BISULFATE 75 MG PO TABS: 75.0000 mg | ORAL_TABLET | Freq: Every day | ORAL | Status: DC

## 2023-02-23 MED ORDER — LOSARTAN POTASSIUM 50 MG PO TABS: 25.0000 mg | ORAL_TABLET | Freq: Every day | ORAL | Status: DC

## 2023-02-23 MED ORDER — POTASSIUM CHLORIDE 10 MEQ/100ML IV SOLN
10.0000 meq | INTRAVENOUS | Status: AC
  Administered 2023-02-23 (×3): 10 meq via INTRAVENOUS
  Filled 2023-02-23 (×3): qty 100

## 2023-02-23 MED ORDER — CARVEDILOL 3.125 MG PO TABS
3.1250 mg | ORAL_TABLET | Freq: Two times a day (BID) | ORAL | Status: DC
  Administered 2023-02-24 – 2023-02-27 (×8): 3.125 mg via ORAL
  Filled 2023-02-23 (×9): qty 1

## 2023-02-23 MED ORDER — HALOPERIDOL LACTATE 5 MG/ML IJ SOLN
5.0000 mg | Freq: Four times a day (QID) | INTRAMUSCULAR | Status: DC | PRN
  Administered 2023-02-23 – 2023-03-04 (×10): 5 mg via INTRAMUSCULAR
  Filled 2023-02-23 (×10): qty 1

## 2023-02-23 MED ORDER — POTASSIUM CL IN DEXTROSE 5% 20 MEQ/L IV SOLN
20.0000 meq | INTRAVENOUS | Status: DC
Start: 2023-02-23 — End: 2023-02-23

## 2023-02-23 MED ORDER — DEXTROSE 5 % IV SOLN
10.0000 mg/kg | Freq: Two times a day (BID) | INTRAVENOUS | Status: DC
  Administered 2023-02-24 – 2023-02-27 (×8): 680 mg via INTRAVENOUS
  Filled 2023-02-23 (×7): qty 13.6
  Filled 2023-02-23: qty 10
  Filled 2023-02-23 (×4): qty 13.6

## 2023-02-23 MED ORDER — HALOPERIDOL LACTATE 5 MG/ML IJ SOLN
5.0000 mg | Freq: Once | INTRAMUSCULAR | Status: AC | PRN
  Administered 2023-02-23: 5 mg via INTRAVENOUS
  Filled 2023-02-23: qty 1

## 2023-02-23 MED ORDER — HYDRALAZINE HCL 20 MG/ML IJ SOLN
10.0000 mg | Freq: Four times a day (QID) | INTRAMUSCULAR | Status: DC | PRN
  Administered 2023-02-23 – 2023-03-03 (×7): 10 mg via INTRAVENOUS
  Filled 2023-02-23 (×7): qty 1

## 2023-02-23 NOTE — Progress Notes (Signed)
Pharmacy Antibiotic Note  Dennis [name] is a 77 y.o. male admitted on 02/22/2023 with  rule out meningitis .  Pharmacy has been consulted for Vancomycin, Ceftriaxone, Ampicillin, Acyclovir dosing. WBC ok. Scr 1.33.   Plan: Vancomycin 1500 mg IV x 1, then 1000 mg IV q24h >>>Traditional dosing in setting of rule out meningitis Increase Ceftriaxone to 2g IV q12h Ampicillin 2g IV q6h Acyclovir 10 mg/kg IV q12h F/U need for IV fluids once current fluid order expires  Trend WBC, temp, renal function  F/U infectious work-up Drug levels as indicated   Height: 5\' 4"  (162.6 cm) Weight: 68 kg (149 lb 14.6 oz) IBW/kg (Calculated) : 59.2  Temp (24hrs), Avg:97.8 F (36.6 C), Min:97.6 F (36.4 C), Max:98 F (36.7 C)  Recent Labs  Lab 02/21/23 1254 02/21/23 1304 02/22/23 1332 02/22/23 2128 02/23/23 0038 02/23/23 0739  WBC 8.1  --  7.9 9.2 9.3 9.0  CREATININE 1.17 1.20 1.19 1.23 1.33*  --     Estimated Creatinine Clearance: 38.3 mL/min (A) (by C-G formula based on SCr of 1.33 mg/dL (H)).    Allergies  Allergen Reactions   Codeine    Shellfish Allergy    Abran Duke, PharmD, BCPS Clinical Pharmacist Phone: [phone]

## 2023-02-23 NOTE — Progress Notes (Signed)
PROGRESS NOTE                                                                                                                                                                                                             Patient Demographics:    Dennis [name], is a 77 y.o. male, DOB - [birth year], WUJ:[id number]  Outpatient Primary MD for the patient is Pcp, No    LOS - 1  Admit date - 02/22/2023    Chief Complaint  Patient presents with   Altered Mental Status       Brief Narrative (HPI from H&P)   77 year old male with past medical history of HLD, BPH, HTN, rheumatoid arthritis, COPD, Barrett's esophagus, OSA does not use CPAP, history of CVA. Patient sent in for altered mentation. At baseline patient has a ataxic gait but is not confused. Over the course of the past week he has become increasingly confused. He was brought to the ER.  In the ER he was trying to escape from the ER room had to be IVC need placed with 1 is to 1 sitter, had 2 CT scans of the head which were unremarkable, he was admitted for further workup of encephalopathy.   Subjective:    Dennis [name] today in bed currently confused cannot provide any review of systems.   Assessment  & Plan :   Gradually progressive metabolic encephalopathy over the course of 5 to 7 days.  Brought in by wife for worsening confusion, confusion persisted in the ER, CT head x 2 several hours apart is nonacute, no focal deficits apparent, unable to provide any history, wife called twice on 02/23/2023 but phone switched off.  He is currently afebrile, no leukocytosis, CRP and ESR are stable, head CT x 2 stable, ABG stable, RPR stable, ammonia stable, will proceed with B12, TSH, A1c, MRI brain, EEG, neurology input, may require LP to bacterial meningitis highly unlikely based on presentation, moving neck freely and neck appears supple.  Continue supportive care with as needed  Haldol.  Dehydration, reduced urine output, AKI.  Poor oral intake likely due to confusion as above, hold ARB, hydrate with IV fluids.  Bladder scan has 53 cc of urine.  History of rheumatoid arthritis.  Post discharge resume methotrexate and sulfasalazine.  Stable CRP ESR.  BPH.  Proscar and Flomax if he  can tolerate oral medications.  Hypertension.  Continue Coreg, ARB held due to AKI, as needed hydralazine.  Dyslipidemia.  Check a.m. lipid panel.  Hypokalemia.  Replaced.      Condition - Extremely Guarded  Family Communication  : Called wife Dennis [name] [phone] twice on 02/23/2023 at 8 AM and 10 AM, no response, message left on answering machine on her cell phone.  Code Status :  Full  Consults  :  Neuro  PUD Prophylaxis :  PPI   Procedures  :     CT head x 2.  Nonacute.    MRI brain.    EEG.      Disposition Plan  :    Status is: Inpatient  DVT Prophylaxis  :    heparin injection 5,000 Units Start: 02/23/23 2200    Lab Results  Component Value Date   PLT 228 02/23/2023    Diet :  Diet Order             Diet NPO time specified  Diet effective now                    Inpatient Medications  Scheduled Meds:  carvedilol  3.125 mg Oral BID WC   clopidogrel  75 mg Oral Daily   finasteride  5 mg Oral Daily   haloperidol lactate  5 mg Intravenous Once   heparin injection (subcutaneous)  5,000 Units Subcutaneous Q8H   tamsulosin  0.4 mg Oral QPC supper   Continuous Infusions:  dextrose 5 % with KCl 20 mEq / L     PRN Meds:.acetaminophen **OR** acetaminophen, haloperidol lactate, hydrALAZINE, ondansetron **OR** ondansetron (ZOFRAN) IV, senna-docusate  Antibiotics  :    Anti-infectives (From admission, onward)    None         Objective:   Vitals:   02/22/23 2045 02/22/23 2100 02/22/23 2302 02/23/23 0800  BP: (!) 149/85 (!) 141/90 (!) 157/109 133/85  Pulse: 92 88 89 74  Resp: 18 (!) 21 20 20   Temp:   (!) 97.4 F (36.3 C) 97.6 F  (36.4 C)  TempSrc:   Oral Axillary  SpO2: 94% 97% 91% 93%  Weight:      Height:        Wt Readings from Last 3 Encounters:  02/22/23 68 kg  02/21/23 68 kg  12/25/22 68 kg     Intake/Output Summary (Last 24 hours) at 02/23/2023 1014 Last data filed at 02/23/2023 0622 Gross per 24 hour  Intake --  Output 50 ml  Net -50 ml     Physical Exam  Awake but confused, supple neck, No new F.N deficits,   [place].AT,PERRAL Supple Neck, No JVD,   Symmetrical Chest wall movement, Good air movement bilaterally, CTAB RRR,No Gallops,Rubs or new Murmurs,  +ve B.Sounds, Abd Soft, No tenderness,   No Cyanosis, Clubbing or edema        Data Review:    Recent Labs  Lab 02/21/23 1254 02/21/23 1304 02/22/23 1332 02/22/23 2128 02/23/23 0038 02/23/23 0739  WBC 8.1  --  7.9 9.2 9.3 9.0  HGB 13.4 15.0 13.9 13.2 13.6 13.0  HCT 43.2 44.0 44.2 40.8 42.7 40.6  PLT 307  --  276 232 236 228  MCV 92.3  --  92.9 89.7 89.9 93.3  MCH 28.6  --  29.2 29.0 28.6 29.9  MCHC 31.0  --  31.4 32.4 31.9 32.0  RDW 14.3  --  14.2 14.0 13.9 14.0  LYMPHSABS 2.6  --   --   --  1.5 2.0  MONOABS 1.0  --   --   --  0.6 1.2*  EOSABS 0.2  --   --   --  0.0 0.0  BASOSABS 0.1  --   --   --  0.0 0.1    Recent Labs  Lab 02/21/23 1254 02/21/23 1304 02/22/23 1332 02/22/23 2128 02/23/23 0038 02/23/23 0452  NA 138 140 139  --  140  --   K 4.3 4.4 4.0  --  3.4*  --   CL 102 103 102  --  103  --   CO2 30  --  26  --  25  --   ANIONGAP 6  --  11  --  12  --   GLUCOSE 101* 95 94  --  122*  --   BUN 15 17 13   --  14  --   CREATININE 1.17 1.20 1.19 1.23 1.33*  --   AST 21  --  20  --   --   --   ALT 17  --  15  --   --   --   ALKPHOS 34*  --  34*  --   --   --   BILITOT 0.6  --  0.3  --   --   --   ALBUMIN 3.5  --  3.5  --   --   --   CRP  --   --   --   --  0.5  --   INR 1.0  --   --   --   --   --   TSH  --   --   --  3.180  --   --   AMMONIA  --   --   --   --   --  12  CALCIUM 11.6*  --  11.6*  --  10.8*   --       Recent Labs  Lab 02/21/23 1254 02/22/23 1332 02/22/23 2128 02/23/23 0038 02/23/23 0452  CRP  --   --   --  0.5  --   INR 1.0  --   --   --   --   TSH  --   --  3.180  --   --   AMMONIA  --   --   --   --  12  CALCIUM 11.6* 11.6*  --  10.8*  --     --------------------------------------------------------------------------------------------------------------- Lab Results  Component Value Date   CHOL  11/20/2006    127        ATP III CLASSIFICATION:  <200     mg/dL   Desirable  161-096  mg/dL   Borderline High  >=045    mg/dL   High   HDL 21 (L) 40/98/1191   LDLCALC  11/20/2006    83        Total Cholesterol/HDL:CHD Risk Coronary Heart Disease Risk Table                     Men   Women  1/2 Average Risk   3.4   3.3   TRIG 117 11/20/2006   CHOLHDL 6.0 11/20/2006    No results found for: "HGBA1C" Recent Labs    02/22/23 2128  TSH 3.180   No results for input(s): "VITAMINB12", "FOLATE", "FERRITIN", "TIBC", "IRON", "RETICCTPCT" in the last 72 hours. ------------------------------------------------------------------------------------------------------------------ Cardiac Enzymes No results for input(s): "CKMB", "TROPONINI", "MYOGLOBIN" in the last 168 hours.  Invalid  input(s): "CK"  Micro Results Recent Results (from the past 240 hour(s))  MRSA Next Gen by PCR, Nasal     Status: None   Collection Time: 02/23/23  3:31 AM   Specimen: Nasal Mucosa; Nasal Swab  Result Value Ref Range Status   MRSA by PCR Next Gen NOT DETECTED NOT DETECTED Final    Comment: (NOTE) The GeneXpert MRSA Assay (FDA approved for NASAL specimens only), is one component of a comprehensive MRSA colonization surveillance program. It is not intended to diagnose MRSA infection nor to guide or monitor treatment for MRSA infections. Test performance is not FDA approved in patients less than 19 years old. Performed at [facility], [address], [place], [place] [address]      Radiology Reports CT Head Wo Contrast  Result Date: 02/22/2023 CLINICAL DATA:  Altered mental status EXAM: CT HEAD WITHOUT CONTRAST TECHNIQUE: Contiguous axial images were obtained from the base of the skull through the vertex without intravenous contrast. RADIATION DOSE REDUCTION: This exam was performed according to the departmental dose-optimization program which includes automated exposure control, adjustment of the mA and/or kV according to patient size and/or use of iterative reconstruction technique. COMPARISON:  CT 12/25/2022, 02/21/2023 FINDINGS: Brain: No acute territorial infarction, hemorrhage or intracranial mass. Atrophy and advanced chronic small vessel ischemic changes of the white matter. More focal right posterior frontal hypodensity, series 2, image 21, also chronic. Age indeterminate lacunar infarct in the head of left caudate, suspect chronic. Ventricles are stable in size. Vascular: No hyperdense vessels. Vertebral and carotid vascular calcification Skull: Normal. Negative for fracture or focal lesion. Sinuses/Orbits: No acute finding. Other: None IMPRESSION: 1. No CT evidence for acute intracranial abnormality. 2. Atrophy and advanced chronic small vessel ischemic changes of the white matter. Age indeterminate lacunar infarct within the left caudate head. Electronically Signed   By: Jasmine Pang M.D.   On: 02/22/2023 18:01   CT HEAD WO CONTRAST  Result Date: 02/21/2023 CLINICAL DATA:  Altered mental status, stroke suspected EXAM: CT HEAD WITHOUT CONTRAST TECHNIQUE: Contiguous axial images were obtained from the base of the skull through the vertex without intravenous contrast. RADIATION DOSE REDUCTION: This exam was performed according to the departmental dose-optimization program which includes automated exposure control, adjustment of the mA and/or kV according to patient size and/or use of iterative reconstruction technique. COMPARISON:  12/25/2022 FINDINGS: Brain: No evidence  of acute infarction, hemorrhage, hydrocephalus, extra-axial collection or mass lesion/mass effect. Periventricular and deep white matter hypodensity. Vascular: No hyperdense vessel or unexpected calcification. Skull: Normal. Negative for fracture or focal lesion. Sinuses/Orbits: No acute finding. Other: None. IMPRESSION: No acute intracranial pathology. Small-vessel white matter disease. MRI may be used to more sensitively evaluate for acute diffusion restricting infarction if clinically suspected. Electronically Signed   By: Jearld Lesch M.D.   On: 02/21/2023 15:36      Signature  -   Susa Raring M.D on 02/23/2023 at 10:14 AM   -  To page go to [email or web address]

## 2023-02-23 NOTE — Plan of Care (Signed)
  Problem: Safety: Goal: Non-violent Restraint(s) Outcome: Progressing   Problem: Education: Goal: Knowledge of General Education information will improve Description: Including pain rating scale, medication(s)/side effects and non-pharmacologic comfort measures Outcome: Progressing   Problem: Health Behavior/Discharge Planning: Goal: Ability to manage health-related needs will improve Outcome: Progressing   Problem: Clinical Measurements: Goal: Ability to maintain clinical measurements within normal limits will improve Outcome: Progressing   Problem: Activity: Goal: Risk for activity intolerance will decrease Outcome: Progressing   Problem: Nutrition: Goal: Adequate nutrition will be maintained Outcome: Progressing   Problem: Coping: Goal: Level of anxiety will decrease Outcome: Progressing   Problem: Elimination: Goal: Will not experience complications related to bowel motility Outcome: Progressing   Problem: Safety: Goal: Ability to remain free from injury will improve Outcome: Progressing   Problem: Skin Integrity: Goal: Risk for impaired skin integrity will decrease Outcome: Progressing

## 2023-02-23 NOTE — Consult Note (Addendum)
NEUROLOGY CONSULTATION  Reason for Consult: Altered mental status   CC: Altered mental status   HPI  Dennis [name] is a 77 y.o. male with a past medical history of arthritis, HTN, HLD, BPH, rheumatoid arthritis, COPD, barrett's esphagus, OSA, CVA presenting with altered mentation. Initially presented to the ED on 10/4 with unsteadiness, confusion, and decreased activity.  It appears he may have left the ED and presented back on 10/5 for continued altered mental status.  On chart review it appears that he was last seen to be in his normal state of health around 9 PM on 10/3. Afebrile, Tmax on admission is 98.49F. Sed rate 9, CRP 0.5. Still needs MRI . No neck tenderness on exam, but there  is mild rigidity on neck flexion and rotation. Positive Kernigs sign. Negative Brudzinskis sign.  He is intermittently agitated and then drowsy and requiring a sitter and restraints.   History is obtained from: Chart review   ROS: Review of systems not obtained due to patient factors.   Past Medical History:  Past Medical History:  Diagnosis Date   Arthritis    Hypertension     Family History History reviewed. No pertinent family history.  Allergies:  Allergies  Allergen Reactions   Codeine    Shellfish Allergy     Social History:   reports that he has quit smoking. His smoking use included cigarettes. He has never used smokeless tobacco. He reports that he does not drink alcohol and does not use drugs.    Medications Medications Prior to Admission  Medication Sig Dispense Refill   atorvastatin (LIPITOR) 40 MG tablet Take 1 tablet by mouth daily.     carvedilol (COREG) 3.125 MG tablet Take by mouth.     cefdinir (OMNICEF) 300 MG capsule Take 300 mg by mouth 2 (two) times daily.     clobetasol ointment (TEMOVATE) 0.05 % Apply 1 Application topically 2 (two) times daily. For 2 weeks, then stop for 1 week. Repeat as needed.     clonazePAM (KLONOPIN) 0.5 MG tablet Take by mouth.     clopidogrel  (PLAVIX) 75 MG tablet Take by mouth.     doxycycline (VIBRAMYCIN) 100 MG capsule Take 1 capsule (100 mg total) by mouth 2 (two) times daily. 20 capsule 0   finasteride (PROSCAR) 5 MG tablet Take by mouth.     folic acid (FOLVITE) 1 MG tablet Take 1 mg by mouth daily.     gabapentin (NEURONTIN) 100 MG capsule Take 100 mg by mouth 3 (three) times daily.     lidocaine (XYLOCAINE) 2 % solution Use as directed 15 mLs in the mouth or throat every 6 (six) hours as needed for mouth pain.     losartan (COZAAR) 25 MG tablet Take 1 tablet by mouth daily.     methotrexate 50 MG/2ML injection Inject 30 mg/m2 into the skin once a week.     mirtazapine (REMERON) 15 MG tablet TAKE 1/2 (ONE-HALF) TABLET BY MOUTH AT BEDTIME FOR ANXIETY     omeprazole (PRILOSEC) 20 MG capsule Take 20 mg by mouth 2 (two) times daily.     sulfaSALAzine (AZULFIDINE) 500 MG tablet Take by mouth.     tamsulosin (FLOMAX) 0.4 MG CAPS capsule Take by mouth.     traMADol (ULTRAM) 50 MG tablet Take by mouth.     triamcinolone cream (KENALOG) 0.1 % Apply 1 application topically 2 (two) times daily. 30 g 0    carvedilol  3.125 mg Oral BID WC  clopidogrel  75 mg Oral Daily   finasteride  5 mg Oral Daily   heparin injection (subcutaneous)  5,000 Units Subcutaneous Q8H   pantoprazole (PROTONIX) IV  40 mg Intravenous Q24H   tamsulosin  0.4 mg Oral QPC supper     EXAMINATION  Current vital signs:    02/22/2023   11:02 PM 02/22/2023    9:00 PM 02/22/2023    8:45 PM  Vitals with BMI  Systolic 157 141 563  Diastolic 109 90 85  Pulse 89 88 92    Examination:  GENERAL: Drowsy, in NAD HEENT: - Normocephalic and atraumatic. Mild to moderate rigidity on neck flexion and rotation. Positive Kernigs sign. Negative Brudzinskis sign.  LUNGS - Regular, unlabored respirations CV - S1S2 RRR, equal pulses bilaterally. ABDOMEN - Soft, nontender, nondistended with normoactive BS Ext: warm, well perfused, intact peripheral pulses, no pedal  edema Integumentary:  Skin intact on clothed exam No tenderness with neck palpation, does resist some ROM in neck  NEURO:  Mental Status: Drowsy, opens eyes to voice.  Able to state name, unable to tell me location, date or why he is in the hospital Language: speech is sparse, voice is hoarse.  Does not consistently follow commands, will not repeat or identify objects Cranial Nerves:  II: PERRL. Visual fields full to confrontation.  III, IV, VI: EOM intact. Eyelids elevate symmetrically. Blinks to threat.  VII: no facial asymmetry   VIII: hearing intact to voice IX, X: Palate elevates symmetrically. Hypophonic  OV:FIEP is midline XII: tongue is midline without fasciculations. Motor:  Moves all extremities antigravity without asymmetry Tone: is normal and bulk is normal DTRs: 2+ and symmetrical throughout   Sensation-localizes extremities Coordination:Not cooperative for FNF or HKS Gait- deferred,   LABS  I have reviewed labs in epic and the results pertinent to this consultation are:   Lab Results  Component Value Date   [facility]  11/20/2006    83        Total Cholesterol/HDL:CHD Risk Coronary Heart Disease Risk Table                     Men   Women  1/2 Average Risk   3.4   3.3   Lab Results  Component Value Date   ALT 15 02/22/2023   AST 20 02/22/2023   ALKPHOS 34 (L) 02/22/2023   BILITOT 0.3 02/22/2023   Lab Results  Component Value Date   WBC 9.0 02/23/2023   HGB 13.0 02/23/2023   HCT 40.6 02/23/2023   MCV 93.3 02/23/2023   PLT 228 02/23/2023   Lab Results  Component Value Date   NA 140 02/23/2023   K 3.4 (L) 02/23/2023   CL 103 02/23/2023   CO2 25 02/23/2023   Lab Results  Component Value Date   CRP 0.5 02/23/2023   Lab Results  Component Value Date   ESRSEDRATE 9 02/23/2023    DIAGNOSTIC IMAGING/PROCEDURES  I have reviewed the images obtained:, as below    CT-head 1. No CT evidence for acute intracranial abnormality. 2. Atrophy and  advanced chronic small vessel ischemic changes of the white matter. Age indeterminate lacunar infarct within the left caudate head.  MRI brain 1. No acute intracranial abnormality. 2. Moderate generalized atrophy and white matter disease likely reflects the sequela of chronic microvascular ischemia. 3. Remote lacunar infarct of the right thalamus. 4. Bilateral mastoid effusions. No obstructing nasopharyngeal lesion is present.  EEG 10/6 EEG Abnormalities: 1) generalized irregular slow  activity Clinical Interpretation: This EEG is consistent with a generalized non-specific cerebral dysfunction(encephalopathy). There was no seizure or seizure predisposition recorded on this study. Please note that lack of epileptiform activity on EEG does not preclude the possibility of epilepsy.   Assessment: 77 y.o. male with a pertinent medical history of arthritis, HTN, HLD, BPH, rheumatoid arthritis, COPD, barrett's esphagus, OSA, CVA presenting with altered mentation.    - Exam reveals an encephalopathic patient with  He is currently in restraints due to agitation and he fluctuates between drowsiness and impulsivity throughout exam, consistent with a delirium.  - Afebrile to axillary temperature. Infectious process is lower on the differential due to normal ESR and CRP, but there is mild to moderate rigidity on neck flexion and rotation as well as positive Kernigs sign.  - EEG: This EEG is consistent with a generalized non-specific cerebral dysfunction(encephalopathy). There was no seizure or seizure predisposition recorded on this study.  - CT head: No CT evidence for acute intracranial abnormality. Atrophy and advanced chronic small vessel ischemic changes of the white matter. Age indeterminate lacunar infarct within the left caudate head. - MRI brain: No acute intracranial abnormality. Moderate generalized atrophy and white matter disease likely reflects the sequela of chronic microvascular ischemia. Remote  lacunar infarct of the right thalamus. Bilateral mastoid effusions; no obstructing nasopharyngeal lesion is present. - Estimated GFR > 60 - WBC normal on all lab draws.  Impression: Meningitis vs delirium due to possible toxic/metabolic etiology in the setting of dementia   Recommendations: - Check B12, B1, Folate - [place] spotted fever labs have been drawn - Obtain MRI Brain with contrast to assess for possible abnormal meningeal enhancement - RN requested to obtain rectal temperature.  - Due to intermittent agitation, it most likely will not be feasible to obtain LP without sedation. Also on Plavix. - Start empiric meningitis-dose antibiotics. Ampicillin (old age and debility), ceftriaxone, vancomycin and acyclovir.  - Appreciate Pharmacy assistance.  - Frequent neuro checks. - ID consult - Hospitalist team has been notified.    **This documentation was dictated using Dragon Medical Software and may contain inadvertent errors **  I have seen and examined the patient. I have formulated the assessment and recommendations. 77 year old male presenting with persistent AMS. Exam reveals an encephalopathic patient with  He is currently in restraints due to agitation and he fluctuates between drowsiness and impulsivity throughout exam, consistent with a delirium. Recommendations include empiric meningitis-dose ABX and frequent neuro checks.   Electronically signed: Dr. Caryl Pina

## 2023-02-23 NOTE — Progress Notes (Signed)
EEG complete - results pending 

## 2023-02-23 NOTE — Procedures (Signed)
History: 77 yo M being evaluated for AMS  Sedation: none  Patient State: Awake and asleep  Technique: This EEG was acquired with electrodes placed according to the International 10-20 electrode system (including Fp1, Fp2, F3, F4, C3, C4, P3, P4, O1, O2, T3, T4, T5, T6, A1, A2, Fz, Cz, Pz). The following electrodes were missing or displaced: none.   Background: The background consists predominantly of sleep, but with arousal there is a posterior rhythm of 8 - 9Hz  seen. There is also irregular generalized theta and delta range activity intrubding into the background. No epileptiform discharges were seen.   Photic stimulation: Physiologic driving is not performed  EEG Abnormalities: 1) generalized irregular slow activity  Clinical Interpretation: This EEG is consistent with a generalized non-specific cerebral dysfunction(encephalopathy). There was no seizure or seizure predisposition recorded on this study. Please note that lack of epileptiform activity on EEG does not preclude the possibility of epilepsy.   Ritta Slot, MD [facility] [phone]  If 7pm- 7am, please page neurology on call as listed in AMION.

## 2023-02-23 NOTE — Progress Notes (Signed)
Spoke to "[name]" in patient's emergency contacts. States that she is their neighbor, but looks after Mr. and Mrs. Hardage and is their primary contact. Requesting that all updates be called to her. Mrs. Denning has memory loss and is not a reliable source of information, per [name].

## 2023-02-23 NOTE — Evaluation (Signed)
Clinical/Bedside Swallow Evaluation Patient Details  Name: Dennis [name] MRN: [id number] Date of Birth: 1945-12-22  Today's Date: 02/23/2023 Time: SLP Start Time (ACUTE ONLY): 0851 SLP Stop Time (ACUTE ONLY): 0900 SLP Time Calculation (min) (ACUTE ONLY): 9 min  Past Medical History:  Past Medical History:  Diagnosis Date   Arthritis    Hypertension    Past Surgical History:  Past Surgical History:  Procedure Laterality Date   CHOLECYSTECTOMY     THROAT SURGERY     HPI:  Dennis [name] is a 77 yo male presenting to ED 10/5 with AMS. CTH negative. MRI Brain pending. PMH includes HLD, BPH, HTN, rheumatoid arthritis, COPD, Barrett's esophagus, OSA, prior CVA    Assessment / Plan / Recommendation  Clinical Impression  Pt currently lethargic, primarily responding to noxious stimuli and in four point restraints due to agitation. Per chart, haldol held this morning. When presented with noxious stimuli, he opened his eyes for <1 minute. Unable to follow commands to complete an oral motor exam. Pt with overall poor awareness of presentations of ice chips, thin liquids, and purees. Per RN, pt has oral meds due although question his ability to take meds safely given lethargy. Recommend downgrading to NPO. If mentation and lethargy improves, suspect pt can take meds crushed in puree although all POs should be held until pt is fully awake and alert. Will continue to f/u to assess pt's ability to upgrade diet as mentation allows. SLP Visit Diagnosis: Dysphagia, unspecified (R13.10)    Aspiration Risk  Moderate aspiration risk    Diet Recommendation NPO    Medication Administration: Via alternative means    Other  Recommendations Oral Care Recommendations: Oral care QID;Staff/trained caregiver to provide oral care    Recommendations for follow up therapy are one component of a multi-disciplinary discharge planning process, led by the attending physician.  Recommendations may be updated based on  patient status, additional functional criteria and insurance authorization.  Follow up Recommendations No SLP follow up      Assistance Recommended at Discharge    Functional Status Assessment Patient has had a recent decline in their functional status and demonstrates the ability to make significant improvements in function in a reasonable and predictable amount of time.  Frequency and Duration min 2x/week  2 weeks       Prognosis Prognosis for improved oropharyngeal function: Good Barriers to Reach Goals: Cognitive deficits      Swallow Study   General HPI: Dennis [name] is a 77 yo male presenting to ED 10/5 with AMS. CTH negative. MRI Brain pending. PMH includes HLD, BPH, HTN, rheumatoid arthritis, COPD, Barrett's esophagus, OSA, prior CVA Type of Study: Bedside Swallow Evaluation Previous Swallow Assessment: none in chart Diet Prior to this Study: Regular;Thin liquids (Level 0) Temperature Spikes Noted: No Respiratory Status: Room air History of Recent Intubation: No Behavior/Cognition: Lethargic/Drowsy;Requires cueing;Doesn't follow directions Oral Cavity Assessment: Within Functional Limits Oral Care Completed by SLP: No Oral Cavity - Dentition: Adequate natural dentition Vision: Functional for self-feeding Self-Feeding Abilities: Total assist Patient Positioning: Upright in bed Baseline Vocal Quality: Not observed Volitional Cough: Cognitively unable to elicit Volitional Swallow: Unable to elicit    Oral/Motor/Sensory Function Overall Oral Motor/Sensory Function: Within functional limits   Ice Chips Ice chips: Impaired Presentation: Spoon Oral Phase Impairments: Poor awareness of bolus   Thin Liquid Thin Liquid: Impaired Presentation: Straw;Spoon Oral Phase Impairments: Poor awareness of bolus    Nectar Thick Nectar Thick Liquid: Not tested  Honey Thick Honey Thick Liquid: Not tested   Puree Puree: Impaired Presentation: Spoon Oral Phase Impairments: Poor  awareness of bolus   Solid     Solid: Not tested      Gwynneth Aliment, M.A., CF-SLP Speech Language Pathology, Acute Rehabilitation Services  Secure Chat preferred [phone]  02/23/2023,9:10 AM

## 2023-02-23 NOTE — Plan of Care (Signed)

## 2023-02-24 ENCOUNTER — Inpatient Hospital Stay (HOSPITAL_COMMUNITY): Payer: 59

## 2023-02-24 DIAGNOSIS — R410 Disorientation, unspecified: Secondary | ICD-10-CM | POA: Diagnosis not present

## 2023-02-24 DIAGNOSIS — R9431 Abnormal electrocardiogram [ECG] [EKG]: Secondary | ICD-10-CM | POA: Diagnosis not present

## 2023-02-24 LAB — CBC WITH DIFFERENTIAL/PLATELET
Abs Immature Granulocytes: 0.04 10*3/uL (ref 0.00–0.07)
Basophils Absolute: 0 10*3/uL (ref 0.0–0.1)
Basophils Relative: 0 %
Eosinophils Absolute: 0.1 10*3/uL (ref 0.0–0.5)
Eosinophils Relative: 1 %
HCT: 41.3 % (ref 39.0–52.0)
Hemoglobin: 13.3 g/dL (ref 13.0–17.0)
Immature Granulocytes: 0 %
Lymphocytes Relative: 19 %
Lymphs Abs: 1.7 10*3/uL (ref 0.7–4.0)
MCH: 29.4 pg (ref 26.0–34.0)
MCHC: 32.2 g/dL (ref 30.0–36.0)
MCV: 91.2 fL (ref 80.0–100.0)
Monocytes Absolute: 0.8 10*3/uL (ref 0.1–1.0)
Monocytes Relative: 9 %
Neutro Abs: 6.5 10*3/uL (ref 1.7–7.7)
Neutrophils Relative %: 71 %
Platelets: 215 10*3/uL (ref 150–400)
RBC: 4.53 MIL/uL (ref 4.22–5.81)
RDW: 14 % (ref 11.5–15.5)
WBC: 9.3 10*3/uL (ref 4.0–10.5)
nRBC: 0 % (ref 0.0–0.2)

## 2023-02-24 LAB — COMPREHENSIVE METABOLIC PANEL
ALT: 16 U/L (ref 0–44)
AST: 30 U/L (ref 15–41)
Albumin: 2.9 g/dL — ABNORMAL LOW (ref 3.5–5.0)
Alkaline Phosphatase: 29 U/L — ABNORMAL LOW (ref 38–126)
Anion gap: 7 (ref 5–15)
BUN: 10 mg/dL (ref 8–23)
CO2: 23 mmol/L (ref 22–32)
Calcium: 9.4 mg/dL (ref 8.9–10.3)
Chloride: 105 mmol/L (ref 98–111)
Creatinine, Ser: 1.21 mg/dL (ref 0.61–1.24)
GFR, Estimated: >60 mL/min (ref >60)
Glucose, Bld: 98 mg/dL (ref 70–99)
Potassium: 3.6 mmol/L (ref 3.5–5.1)
Sodium: 135 mmol/L (ref 135–145)
Total Bilirubin: 0.6 mg/dL (ref 0.3–1.2)
Total Protein: 6.3 g/dL — ABNORMAL LOW (ref 6.5–8.1)

## 2023-02-24 LAB — ECHOCARDIOGRAM COMPLETE
Calc EF: 49.5 %
Height: 64 in
S' Lateral: 3.2 cm
Single Plane A2C EF: 43.8 %
Single Plane A4C EF: 58.2 %
Weight: 2398.6 [oz_av]

## 2023-02-24 LAB — MAGNESIUM: Magnesium: 1.7 mg/dL (ref 1.7–2.4)

## 2023-02-24 LAB — TROPONIN I (HIGH SENSITIVITY)
Troponin I (High Sensitivity): 65 ng/L — ABNORMAL HIGH (ref <18)
Troponin I (High Sensitivity): 67 ng/L — ABNORMAL HIGH (ref <18)

## 2023-02-24 LAB — LIPID PANEL
Cholesterol: 152 mg/dL (ref 0–200)
HDL: 28 mg/dL — ABNORMAL LOW (ref >40)
LDL Cholesterol: 105 mg/dL — ABNORMAL HIGH (ref 0–99)
Total CHOL/HDL Ratio: 5.4 {ratio}
Triglycerides: 94 mg/dL (ref <150)
VLDL: 19 mg/dL (ref 0–40)

## 2023-02-24 LAB — BRAIN NATRIURETIC PEPTIDE: B Natriuretic Peptide: 408.6 pg/mL — ABNORMAL HIGH (ref 0.0–100.0)

## 2023-02-24 LAB — PROCALCITONIN: Procalcitonin: <0.1 ng/mL

## 2023-02-24 LAB — C-REACTIVE PROTEIN: CRP: 0.8 mg/dL (ref <1.0)

## 2023-02-24 MED ORDER — MAGNESIUM SULFATE IN D5W 1-5 GM/100ML-% IV SOLN
1.0000 g | Freq: Once | INTRAVENOUS | Status: AC
  Administered 2023-02-24: 1 g via INTRAVENOUS
  Filled 2023-02-24: qty 100

## 2023-02-24 MED ORDER — ATORVASTATIN CALCIUM 40 MG PO TABS
40.0000 mg | ORAL_TABLET | Freq: Every day | ORAL | Status: DC
  Administered 2023-02-24 – 2023-03-16 (×21): 40 mg via ORAL
  Filled 2023-02-24 (×21): qty 1

## 2023-02-24 MED ORDER — FOLIC ACID 1 MG PO TABS
1.0000 mg | ORAL_TABLET | Freq: Every day | ORAL | Status: DC
  Administered 2023-02-24 – 2023-03-16 (×21): 1 mg via ORAL
  Filled 2023-02-24 (×21): qty 1

## 2023-02-24 MED ORDER — LACTATED RINGERS IV SOLN: INTRAVENOUS | Status: AC

## 2023-02-24 MED ORDER — POTASSIUM CL IN DEXTROSE 5% 20 MEQ/L IV SOLN
20.0000 meq | INTRAVENOUS | Status: AC
  Administered 2023-02-24: 20 meq via INTRAVENOUS
  Filled 2023-02-24 (×2): qty 1000
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
## 2023-02-24 NOTE — Progress Notes (Signed)
Since pt is on IV acyclovir, ok to add LR 50 ml/hr to prevent nephrotoxicity x24 hrs per Dr. Thedore Mins.  Ulyses Southward, PharmD, BCIDP, AAHIVP, CPP Infectious Disease Pharmacist 02/24/2023 2:03 PM

## 2023-02-24 NOTE — Evaluation (Signed)
Physical Therapy Evaluation Patient Details Name: CASEN PRYOR MRN: [id number] DOB: 05-21-1945 Today's Date: 02/24/2023  History of Present Illness  77 yo male presenting to ED 10/5 with AMS. CTH and MRI brain negative. PMH includes HLD, BPH, HTN, rheumatoid arthritis, COPD, Barrett's esophagus, OSA, prior CVA,  dysarthria, LLE weakness, dementia.  Clinical Impression   Pt admitted secondary to problem above with deficits below. PTA patient was living with spouse (per chart). His functional status is unknown (pt too confused and wife's cellphone broken--per chart).  Pt currently requires min assist to stand at EOB, however cognition remains significantly impaired.  Anticipate patient will benefit from PT to address problems listed below.Will continue to follow acutely to maximize functional mobility independence and safety.  Recommend post-acute rehab <3 hrs/day of therapies.          If plan is discharge home, recommend the following: A little help with walking and/or transfers;Assistance with cooking/housework;Assistance with feeding;Direct supervision/assist for medications management;Direct supervision/assist for financial management;Assist for transportation;Help with stairs or ramp for entrance;Supervision due to cognitive status   Can travel by private vehicle   Yes    Equipment Recommendations Other (comment) (TBA--pt unable to report what equipment he has (if any))  Recommendations for Other Services  OT consult    Functional Status Assessment Patient has had a recent decline in their functional status and demonstrates the ability to make significant improvements in function in a reasonable and predictable amount of time.     Precautions / Restrictions Precautions Precautions: Fall Restrictions Weight Bearing Restrictions: No      Mobility  Bed Mobility Overal bed mobility: Needs Assistance Bed Mobility: Supine to Sit, Sit to Supine     Supine to sit: Contact  guard Sit to supine: Min assist   General bed mobility comments: incr time and effort to come to sit wiht feet on the floor; assist with legs up onto bed on return    Transfers Overall transfer level: Needs assistance Equipment used: None Transfers: Sit to/from Stand Sit to Stand: Min assist           General transfer comment: pt impulsively standing once feet on the floor; using posterior calves to brace against bedframe to assist with coming to stand and maintaining his balance    Ambulation/Gait               General Gait Details: Deferred due to decr cognition/confusion  Stairs            Wheelchair Mobility     Tilt Bed    Modified Rankin (Stroke Patients Only)       Balance Overall balance assessment: Mild deficits observed, not formally tested                                           Pertinent Vitals/Pain Pain Assessment Pain Assessment: Faces Faces Pain Scale: No hurt    Home Living Family/patient expects to be discharged to:: Private residence Living Arrangements: Spouse/significant other                 Additional Comments: Pt unable to reliably answer questions for set-up    Prior Function Prior Level of Function : Patient poor historian/Family not available;History of Falls (last six months)             Mobility Comments: chart indicates a history of passing out and  falling       Extremity/Trunk Assessment   Upper Extremity Assessment Upper Extremity Assessment: Overall WFL for tasks assessed;Difficult to assess due to impaired cognition    Lower Extremity Assessment Lower Extremity Assessment: Difficult to assess due to impaired cognition (per history, LLE weakness due to CVA; pt unable to follow instructions for muscle testing)    Cervical / Trunk Assessment Cervical / Trunk Assessment: Kyphotic  Communication   Communication Communication: Difficulty communicating thoughts/reduced clarity  of speech (speech slurred)  Cognition Arousal: Lethargic Behavior During Therapy: Restless Overall Cognitive Status: No family/caregiver present to determine baseline cognitive functioning                                 General Comments: states his name and DOB accurately, "hospital" but doesn't know which one; asked repeatedly what month it is and pt repeating back his name; prompted with "last month was September, so now it's..." with pt again stating his name; in bil wrist restraints        General Comments General comments (skin integrity, edema, etc.): on 2L oxygen throughout, VSS    Exercises     Assessment/Plan    PT Assessment Patient needs continued PT services  PT Problem List Decreased strength;Decreased activity tolerance;Decreased balance;Decreased mobility;Decreased cognition;Decreased knowledge of use of DME;Decreased safety awareness       PT Treatment Interventions DME instruction;Gait training;Functional mobility training;Therapeutic activities;Therapeutic exercise;Balance training;Cognitive remediation;Patient/family education    PT Goals (Current goals can be found in the Care Plan section)  Acute Rehab PT Goals Patient Stated Goal: unable to state PT Goal Formulation: Patient unable to participate in goal setting Time For Goal Achievement: 03/10/23 Potential to Achieve Goals: Fair    Frequency Min 3X/week     Co-evaluation               AM-PAC PT "6 Clicks" Mobility  Outcome Measure Help needed turning from your back to your side while in a flat bed without using bedrails?: None Help needed moving from lying on your back to sitting on the side of a flat bed without using bedrails?: A Little Help needed moving to and from a bed to a chair (including a wheelchair)?: A Little Help needed standing up from a chair using your arms (e.g., wheelchair or bedside chair)?: A Little Help needed to walk in hospital room?: Total Help needed  climbing 3-5 steps with a railing? : Total 6 Click Score: 15    End of Session Equipment Utilized During Treatment: Oxygen Activity Tolerance: Treatment limited secondary to medical complications (Comment) (confusion) Patient left: in bed;with call bell/phone within reach;with bed alarm set;with restraints reapplied;Other (comment) (lab in room) Nurse Communication: Mobility status PT Visit Diagnosis: Unsteadiness on feet (R26.81);Other abnormalities of gait and mobility (R26.89);History of falling (Z91.81);Muscle weakness (generalized) (M62.81)    Time: 1610-9604 PT Time Calculation (min) (ACUTE ONLY): 17 min   Charges:   PT Evaluation $PT Eval Low Complexity: 1 Low   PT General Charges $$ ACUTE PT VISIT: 1 Visit          Jerolyn Center, PT Acute Rehabilitation Services  Office [phone]   Zena Amos 02/24/2023, 12:33 PM

## 2023-02-24 NOTE — Progress Notes (Addendum)
PROGRESS NOTE                                                                                                                                                                                                             Patient Demographics:    Dennis [name], is a 77 y.o. male, DOB - 1945-09-25, VWU:[id number]  Outpatient Primary MD for the patient is Pcp, No    LOS - 2  Admit date - 02/22/2023    Chief Complaint  Patient presents with   Altered Mental Status       Brief Narrative (HPI from H&P)   77 year old male with past medical history of HLD, BPH, HTN, rheumatoid arthritis, COPD, Barrett's esophagus, OSA does not use CPAP, history of CVA. Patient sent in for altered mentation. At baseline patient has a ataxic gait but is not confused. Over the course of the past week he has become increasingly confused. He was brought to the ER.  In the ER he was trying to escape from the ER room had to be IVC need placed with 1 is to 1 sitter, had 2 CT scans of the head which were unremarkable, he was admitted for further workup of encephalopathy.   Subjective:   Patient remains in bed slightly more awake than yesterday but still overall confused, denies any headache   Assessment  & Plan :   Gradually progressive metabolic encephalopathy over the course of 5 to 7 days.  Brought in by wife for worsening confusion, confusion persisted in the ER, CT head x 2 several hours apart is nonacute, no focal deficits apparent, unable to provide any history, wife called twice on 02/23/2023 but phone switched off.  He is currently afebrile, no leukocytosis, CRP and ESR are stable, head CT x 2 stable, ABG stable, RPR stable, ammonia stable, stable B12, TSH, A1c, MRI and EEG, he may require LP to rule out atypical meningitis, bacterial meningitis highly unlikely based on presentation, patient does go out hunting and I think is at risk for possible rickettsial  meningitis versus viral meningitis/encephalitis.  He had been placed on Rocephin morning of 02/23/2023, seen by neurology and antibiotics have been broadened, case discussed with neurologist in detail morning of 02/23/2023 and again on 02/24/2023, they are contemplating LP later on 02/24/2023.  Continue supportive care with as needed Haldol.  Dehydration, reduced urine output, AKI.  Poor oral intake likely due to confusion as above, hold ARB, hydrate with IV fluids.  Bladder scan has 53 cc of urine.  History of rheumatoid arthritis.  Post discharge resume methotrexate and sulfasalazine.  Stable CRP ESR.  BPH.  Proscar and Flomax if he can tolerate oral medications.  Hypertension.  Continue Coreg, ARB held due to AKI, as needed hydralazine.  Dyslipidemia.  Check a.m. lipid panel.  Hypokalemia.  Replaced.  Nonspecific EKG changes.  Denies any chest pain.  Coreg and statin, obtain echocardiogram trend troponin, do not think he has ACS, anti-platelets on hold due to possible pending LP      Condition - Extremely Guarded  Family Communication  : Called wife [name] [phone] twice on 02/23/2023 at 8 AM and 10 AM, no response, message left on answering machine on her cell phone.  Wife informs me that her cell phone is broken, discussed with her in detail in room on 02/23/2023.  Called neighbor and left a detailed message on 02/24/2023 at 10:04 AM.  [name] Biomedical engineer) [phone] (Mobile)   Code Status :  Full  Consults  :  Neuro  PUD Prophylaxis :  PPI   Procedures  :     LP -   Echocardiogram.    CT head x 2.  Nonacute.    MRI brain.  1. No acute intracranial abnormality. 2. Moderate generalized atrophy and white matter disease likely reflects the sequela of chronic microvascular ischemia. 3. Remote lacunar infarct of the right thalamus. 4. Bilateral mastoid effusions. No obstructing nasopharyngeal lesion is present  EEG.  No active seizures      Disposition Plan  :     Status is: Inpatient  DVT Prophylaxis  :    Place and maintain sequential compression device Start: 02/23/23 1015    Lab Results  Component Value Date   PLT 215 02/24/2023    Diet :  Diet Order             Diet NPO time specified Except for: Sips with Meds  Diet effective now                    Inpatient Medications  Scheduled Meds:  atorvastatin  40 mg Oral Daily   carvedilol  3.125 mg Oral BID WC   finasteride  5 mg Oral Daily   folic acid  1 mg Oral Daily   pantoprazole (PROTONIX) IV  40 mg Intravenous Q24H   tamsulosin  0.4 mg Oral QPC supper   Continuous Infusions:  acyclovir 680 mg (02/24/23 0200)   ampicillin (OMNIPEN) IV 2 g (02/24/23 0512)   cefTRIAXone (ROCEPHIN)  IV 2 g (02/24/23 0516)   dextrose 5 % with KCl 20 mEq / L 20 mEq (02/24/23 0850)   magnesium sulfate bolus IVPB     vancomycin     PRN Meds:.acetaminophen **OR** acetaminophen, haloperidol lactate, hydrALAZINE, ondansetron **OR** ondansetron (ZOFRAN) IV, senna-docusate     Objective:   Vitals:   02/24/23 0400 02/24/23 0412 02/24/23 0621 02/24/23 0800  BP: (!) 143/60  (!) 164/77 101/70  Pulse: (!) 106 98  76  Resp: 18 20  17   Temp: 97.7 F (36.5 C)   97.9 F (36.6 C)  TempSrc: Axillary   Axillary  SpO2: 95% 94%  97%  Weight:      Height:        Wt Readings from Last 3 Encounters:  02/22/23 68 kg  02/21/23 68 kg  12/25/22 68 kg     Intake/Output Summary (Last 24 hours) at 02/24/2023 0955 Last data filed at 02/24/2023 0851 Gross per 24 hour  Intake 1976.47 ml  Output 1100 ml  Net 876.47 ml     Physical Exam  Awake but confused, supple neck, No new F.N deficits,   [place].AT,PERRAL Supple Neck, No JVD,   Symmetrical Chest wall movement, Good air movement bilaterally, CTAB RRR,No Gallops,Rubs or new Murmurs,  +ve B.Sounds, Abd Soft, No tenderness,   No Cyanosis, Clubbing or edema        Data Review:    Recent Labs  Lab 02/21/23 1254 02/21/23 1304  02/22/23 1332 02/22/23 2128 02/23/23 0038 02/23/23 0739 02/24/23 0737  WBC 8.1  --  7.9 9.2 9.3 9.0 9.3  HGB 13.4   < > 13.9 13.2 13.6 13.0 13.3  HCT 43.2   < > 44.2 40.8 42.7 40.6 41.3  PLT 307  --  276 232 236 228 215  MCV 92.3  --  92.9 89.7 89.9 93.3 91.2  MCH 28.6  --  29.2 29.0 28.6 29.9 29.4  MCHC 31.0  --  31.4 32.4 31.9 32.0 32.2  RDW 14.3  --  14.2 14.0 13.9 14.0 14.0  LYMPHSABS 2.6  --   --   --  1.5 2.0 1.7  MONOABS 1.0  --   --   --  0.6 1.2* 0.8  EOSABS 0.2  --   --   --  0.0 0.0 0.1  BASOSABS 0.1  --   --   --  0.0 0.1 0.0   < > = values in this interval not displayed.    Recent Labs  Lab 02/21/23 1254 02/21/23 1304 02/22/23 1332 02/22/23 2128 02/23/23 0038 02/23/23 0452 02/23/23 1053 02/23/23 1059 02/24/23 0737  NA 138 140 139  --  140  --   --   --  135  K 4.3 4.4 4.0  --  3.4*  --   --   --  3.6  CL 102 103 102  --  103  --   --   --  105  CO2 30  --  26  --  25  --   --   --  23  ANIONGAP 6  --  11  --  12  --   --   --  7  GLUCOSE 101* 95 94  --  122*  --   --   --  98  BUN 15 17 13   --  14  --   --   --  10  CREATININE 1.17 1.20 1.19 1.23 1.33*  --   --   --  1.21  AST 21  --  20  --   --   --   --   --  30  ALT 17  --  15  --   --   --   --   --  16  ALKPHOS 34*  --  34*  --   --   --   --   --  29*  BILITOT 0.6  --  0.3  --   --   --   --   --  0.6  ALBUMIN 3.5  --  3.5  --   --   --   --   --  2.9*  CRP  --   --   --   --  0.5  --   --   --  0.8  PROCALCITON  --   --   --   --   --   --  <0.10  --   --   INR 1.0  --   --   --   --   --   --   --   --   TSH  --   --   --  3.180  --   --   --   --   --   HGBA1C  --   --   --   --   --   --   --  5.5  --   AMMONIA  --   --   --   --   --  12  --   --   --   BNP  --   --   --   --   --   --   --   --  408.6*  MG  --   --   --   --   --   --   --   --  1.7  CALCIUM 11.6*  --  11.6*  --  10.8*  --   --   --  9.4      Recent Labs  Lab 02/21/23 1254 02/22/23 1332 02/22/23 2128  02/23/23 0038 02/23/23 0452 02/23/23 1053 02/23/23 1059 02/24/23 0737  CRP  --   --   --  0.5  --   --   --  0.8  PROCALCITON  --   --   --   --   --  <0.10  --   --   INR 1.0  --   --   --   --   --   --   --   TSH  --   --  3.180  --   --   --   --   --   HGBA1C  --   --   --   --   --   --  5.5  --   AMMONIA  --   --   --   --  12  --   --   --   BNP  --   --   --   --   --   --   --  408.6*  MG  --   --   --   --   --   --   --  1.7  CALCIUM 11.6* 11.6*  --  10.8*  --   --   --  9.4    --------------------------------------------------------------------------------------------------------------- Lab Results  Component Value Date   CHOL 152 02/24/2023   HDL 28 (L) 02/24/2023   LDLCALC 105 (H) 02/24/2023   TRIG 94 02/24/2023   CHOLHDL 5.4 02/24/2023    Lab Results  Component Value Date   HGBA1C 5.5 02/23/2023   Recent Labs    02/22/23 2128  TSH 3.180   Recent Labs    02/23/23 1053 02/23/23 1107  VITAMINB12 611  --   FOLATE  --  8.4   ------------------------------------------------------------------------------------------------------------------ Cardiac Enzymes No results for input(s): "CKMB", "TROPONINI", "MYOGLOBIN" in the last 168 hours.  Invalid input(s): "CK"  Micro Results Recent Results (from the past 240 hour(s))  MRSA Next Gen by PCR, Nasal     Status: None   Collection Time: 02/23/23  3:31 AM   Specimen: Nasal Mucosa; Nasal Swab  Result Value Ref Range Status   MRSA by  PCR Next Gen NOT DETECTED NOT DETECTED Final    Comment: (NOTE) The GeneXpert MRSA Assay (FDA approved for NASAL specimens only), is one component of a comprehensive MRSA colonization surveillance program. It is not intended to diagnose MRSA infection nor to guide or monitor treatment for MRSA infections. Test performance is not FDA approved in patients less than 60 years old. Performed at [facility], [address] [place], [place] [address]     Radiology  Reports MR BRAIN W WO CONTRAST  Result Date: 02/23/2023 CLINICAL DATA:  Delirium. EXAM: MRI HEAD WITHOUT AND WITH CONTRAST TECHNIQUE: Multiplanar, multiecho pulse sequences of the brain and surrounding structures were obtained without and with intravenous contrast. CONTRAST:  7mL GADAVIST GADOBUTROL 1 MMOL/ML IV SOLN COMPARISON:  CT head without contrast 02/22/2023. FINDINGS: Brain: No acute infarct, hemorrhage, or mass lesion is present. Moderate generalized atrophy and diffuse periventricular white matter changes are present bilaterally. Dilated perivascular spaces are present within the basal ganglia. Remote lacunar infarct is present in the right thalamus. The brainstem and cerebellum are within normal limits. The ventricles are proportionate to the degree of atrophy. No significant extraaxial fluid collection is present. Midline structures are within normal limits. Postcontrast images demonstrate no pathologic enhancement. Vascular: Flow is present in the major intracranial arteries. Skull and upper cervical spine: The craniocervical junction is normal. Upper cervical spine is within normal limits. Marrow signal is unremarkable. Sinuses/Orbits: Bilateral mastoid effusions are present. No obstructing nasopharyngeal lesion is present. A polyp or mucous retention cyst is present within an atelectatic right maxillary sinus. Paranasal sinuses are otherwise clear. The globes and orbits are within normal limits. IMPRESSION: 1. No acute intracranial abnormality. 2. Moderate generalized atrophy and white matter disease likely reflects the sequela of chronic microvascular ischemia. 3. Remote lacunar infarct of the right thalamus. 4. Bilateral mastoid effusions. No obstructing nasopharyngeal lesion is present. Electronically Signed   By: Marin Roberts M.D.   On: 02/23/2023 17:20   CT Head Wo Contrast  Result Date: 02/22/2023 CLINICAL DATA:  Altered mental status EXAM: CT HEAD WITHOUT CONTRAST TECHNIQUE:  Contiguous axial images were obtained from the base of the skull through the vertex without intravenous contrast. RADIATION DOSE REDUCTION: This exam was performed according to the departmental dose-optimization program which includes automated exposure control, adjustment of the mA and/or kV according to patient size and/or use of iterative reconstruction technique. COMPARISON:  CT 12/25/2022, 02/21/2023 FINDINGS: Brain: No acute territorial infarction, hemorrhage or intracranial mass. Atrophy and advanced chronic small vessel ischemic changes of the white matter. More focal right posterior frontal hypodensity, series 2, image 21, also chronic. Age indeterminate lacunar infarct in the head of left caudate, suspect chronic. Ventricles are stable in size. Vascular: No hyperdense vessels. Vertebral and carotid vascular calcification Skull: Normal. Negative for fracture or focal lesion. Sinuses/Orbits: No acute finding. Other: None IMPRESSION: 1. No CT evidence for acute intracranial abnormality. 2. Atrophy and advanced chronic small vessel ischemic changes of the white matter. Age indeterminate lacunar infarct within the left caudate head. Electronically Signed   By: Jasmine Pang M.D.   On: 02/22/2023 18:01   CT HEAD WO CONTRAST  Result Date: 02/21/2023 CLINICAL DATA:  Altered mental status, stroke suspected EXAM: CT HEAD WITHOUT CONTRAST TECHNIQUE: Contiguous axial images were obtained from the base of the skull through the vertex without intravenous contrast. RADIATION DOSE REDUCTION: This exam was performed according to the departmental dose-optimization program which includes automated exposure control, adjustment of the mA and/or kV  according to patient size and/or use of iterative reconstruction technique. COMPARISON:  12/25/2022 FINDINGS: Brain: No evidence of acute infarction, hemorrhage, hydrocephalus, extra-axial collection or mass lesion/mass effect. Periventricular and deep white matter hypodensity.  Vascular: No hyperdense vessel or unexpected calcification. Skull: Normal. Negative for fracture or focal lesion. Sinuses/Orbits: No acute finding. Other: None. IMPRESSION: No acute intracranial pathology. Small-vessel white matter disease. MRI may be used to more sensitively evaluate for acute diffusion restricting infarction if clinically suspected. Electronically Signed   By: Jearld Lesch M.D.   On: 02/21/2023 15:36      Signature  -   Susa Raring M.D on 02/24/2023 at 9:55 AM   -  To page go to [email or web address]

## 2023-02-24 NOTE — Progress Notes (Signed)
Speech Language Pathology Treatment: Dysphagia  Patient Details Name: Dennis [name] MRN: [id number] DOB: 1946-05-08 Today's Date: 02/24/2023 Time: 3474-2595 SLP Time Calculation (min) (ACUTE ONLY): 19 min  Assessment / Plan / Recommendation Clinical Impression  Pt seen for swallow re-assessment/po readiness trial with pt mentation improved as pt able to answer personal information questions with min A and increased vocal intensity d/t HOH status.  Pt consumed thin via tsp/straw with independent use of small sips with straw observed.  Pt then masticated soft solids (fruit) and solids without evidence of pocketing or lingual residue with a slightly delayed swallow initiation.  No overt s/sx of aspiration or oral holding noted.  Pt is min confused, so FULL assistance with meals recommended to implement posted swallow precautions/aspiration precautions including small bites/sips, slow rate and pausing between bites to ensure pt swallows effectively prior to next bite/sips.  Medications whole in puree as tolerated (nursing stated he consumed earlier in shift without difficulty).  Recommend initiating a Dysphagia 3(mechanical soft)/thin liquid diet with general swallowing precautions listed above.  ST will continue to f/u for diet tolerance/dysphagia management/tx in acute setting.    HPI HPI: Dennis [name] is a 77 yo male presenting to ED 10/5 with AMS. CTH negative. MRI Brain pending. PMH includes HLD, BPH, HTN, rheumatoid arthritis, COPD, Barrett's esophagus, OSA, prior CVA; ST saw for BSE on 10/6 with impaired mentation impacting swallow function.  ST f/u for PO trials/swallow re-assessment.      SLP Plan  Goals updated      Recommendations for follow up therapy are one component of a multi-disciplinary discharge planning process, led by the attending physician.  Recommendations may be updated based on patient status, additional functional criteria and insurance authorization.    Recommendations   Diet recommendations: Dysphagia 3 (mechanical soft);Thin liquid Liquids provided via: Straw Medication Administration: Whole meds with puree Supervision: Full supervision/cueing for compensatory strategies;Trained caregiver to feed patient Compensations: Slow rate;Small sips/bites;Other (Comment) (eat/drink when pt alert; watch for swallow prior to next bite/sips given by caregiver) Postural Changes and/or Swallow Maneuvers: Seated upright 90 degrees;Upright 30-60 min after meal                  Oral care BID;Staff/trained caregiver to provide oral care   Frequent or constant Supervision/Assistance Dysphagia, unspecified (R13.10)     Goals updated     Dennis [name],M.S.,CCC-SLP  02/24/2023, 1:14 PM

## 2023-02-24 NOTE — Plan of Care (Signed)
  Problem: Safety: Goal: Non-violent Restraint(s) Outcome: Progressing   Problem: Education: Goal: Knowledge of General Education information will improve Description: Including pain rating scale, medication(s)/side effects and non-pharmacologic comfort measures Outcome: Progressing   Problem: Health Behavior/Discharge Planning: Goal: Ability to manage health-related needs will improve Outcome: Progressing   Problem: Clinical Measurements: Goal: Ability to maintain clinical measurements within normal limits will improve Outcome: Progressing   Problem: Activity: Goal: Risk for activity intolerance will decrease Outcome: Progressing   Problem: Nutrition: Goal: Adequate nutrition will be maintained Outcome: Progressing   Problem: Coping: Goal: Level of anxiety will decrease Outcome: Progressing   Problem: Elimination: Goal: Will not experience complications related to bowel motility Outcome: Progressing   Problem: Pain Managment: Goal: General experience of comfort will improve Outcome: Progressing   Problem: Safety: Goal: Ability to remain free from injury will improve Outcome: Progressing   Problem: Skin Integrity: Goal: Risk for impaired skin integrity will decrease Outcome: Progressing

## 2023-02-24 NOTE — Progress Notes (Signed)
  Echocardiogram 2D Echocardiogram has been performed.  Milda Smart 02/24/2023, 3:32 PM

## 2023-02-24 NOTE — Progress Notes (Signed)
Tele called that patient had a 7-beat Vtach run. Pt is awake, confused, agitated (gave haldol few minutes ago). Bp: 164/77 (97), HR: 99, RR: 20, O2 sat: 95%. Myra Gianotti, DO made aware.

## 2023-02-24 NOTE — Progress Notes (Signed)
Neurology Progress Note  Brief HPI: 77 year old patient with history of arthritis, hypertension, hyperlipidemia, BPH, rheumatoid arthritis, COPD, sleep apnea and stroke presented with altered mentation.  He was noted to be quite confused in the ED and required one-to-one sitter for some time.  He denies headache but does endorse some neck pain on rotation and palpation.  He is afebrile and has no leukocytosis, there is some suspicion for viral or rickettsial meningitis given that patient spends a lot of time outdoors.  Subjective: Patient has been hemodynamically stable overnight and is seen in his room without family at the bedside.  He is able to say that he is in the hospital when given choices but remains confused otherwise.  Exam: Vitals:   02/24/23 1148 02/24/23 1200  BP: 102/64 (!) 118/94  Pulse: 91 90  Resp: 18 18  Temp: 97.8 F (36.6 C) 98 F (36.7 C)  SpO2: 95% 95%   Gen: In bed, NAD Resp: non-labored breathing, no acute distress Abd: soft, nt  Neuro: Patient denies headache and neck pain at rest but does endorse neck pain with rotation of head and tenderness of neck to palpation Mental Status: Oriented to self and place with choices, confused about time and situation, unable to state his age or describe why he is in the hospital Cranial Nerves: Pupils equal round and reactive to light, extraocular movements intact, face symmetrical, phonation normal, hearing intact to voice, tongue midline Motor: Able to move all 4 extremities with good antigravity strength Sensory: Intact to light touch throughout Gait: Deferred  Pertinent Labs:    Latest Ref Rng & Units 02/24/2023    7:37 AM 02/23/2023    7:39 AM 02/23/2023   12:38 AM  CBC  WBC 4.0 - 10.5 K/uL 9.3  9.0  9.3   Hemoglobin 13.0 - 17.0 g/dL 16.1  09.6  04.5   Hematocrit 39.0 - 52.0 % 41.3  40.6  42.7   Platelets 150 - 400 K/uL 215  228  236        Latest Ref Rng & Units 02/24/2023    7:37 AM 02/23/2023   12:38 AM  02/22/2023    9:28 PM  BMP  Glucose 70 - 99 mg/dL 98  409    BUN 8 - 23 mg/dL 10  14    Creatinine 8.11 - 1.24 mg/dL 9.14  7.82  9.56   Sodium 135 - 145 mmol/L 135  140    Potassium 3.5 - 5.1 mmol/L 3.6  3.4    Chloride 98 - 111 mmol/L 105  103    CO2 22 - 32 mmol/L 23  25    Calcium 8.9 - 10.3 mg/dL 9.4  21.3    IV pending Folate 8.4 B12 611 Spotted fever group antibodies pending  Imaging Reviewed:  CT head: No acute abnormality  MRI brain: No acute abnormality, moderate generalized atrophy and chronic white matter disease, remote lacunar infarct of right thalamus and bilateral mastoid effusions  EEG: Generalized irregular slow activity consistent with generalized nonspecific encephalopathy with no seizures or epileptiform discharges  Assessment: 77 year old patient with history of arthritis, hypertension, hyperlipidemia, BPH, rheumatoid arthritis, COPD, sleep apnea and stroke presented originally with altered mentation.  On exam, he was noted to have neck pain with palpation and head rotation as well as confusion.  He does not have a fever or leukocytosis, suspicion is high for viral or rickettsial meningitis given the patient spends a lot of time outdoors.  Patient will need lumbar puncture  to evaluate for this.  Unfortunately, patient's wife's phone is broken and we cannot reach her for consent, so we will need to proceed with two-physician consent for procedure.  Patient is already on appropriate antibiotics and antivirals for meningitis coverage.  EEG was negative for seizure activity, and imaging of the brain reveals no acute abnormality.  Impression: Altered mental status with differential to include viral or rickettsial meningitis, toxic metabolic encephalopathy and delirium  Recommendations: -Lumbar puncture, will send CSF cell count, glucose, protein, Gram stain, culture -Continue meningitis coverage with ampicillin, ceftriaxone, vancomycin and acyclovir  Cortney E Ernestina Columbia , MSN, AGACNP-BC [facility] See Amion for schedule and pager information 02/24/2023 1:08 PM  NEUROHOSPITALIST ADDENDUM Performed a face to face diagnostic evaluation.   I have reviewed the contents of history and physical exam as documented by PA/ARNP/Resident and agree with above documentation.  I have discussed and formulated the above plan as documented. Edits to the note have been made as needed.  Impression/Key exam findings/Plan: confused, does have neck pain and tenderness to palpation and thou can turn his head side to side a little, does seem to grimace upon passive turning of his head. LP would be helpful given concern for meningitis. We are trying to identify next of kin to get consent for LP at this time. Hold DVT prophylaxis and antiplatelet for now.  Erick Blinks, MD [facility] [phone]   If 7pm to 7am, please call on call as listed on AMION.

## 2023-02-24 NOTE — Progress Notes (Signed)
OT Cancellation Note  Patient Details Name: NUMA SCHROETER MRN: [id number] DOB: 08-01-1945   Cancelled Treatment:    Reason Eval/Treat Not Completed: Patient at procedure or test/ unavailable (echo tech in room upon arrival)  [name] 02/24/2023, 2:50 PM

## 2023-02-25 ENCOUNTER — Inpatient Hospital Stay (HOSPITAL_COMMUNITY): Payer: 59

## 2023-02-25 DIAGNOSIS — R410 Disorientation, unspecified: Secondary | ICD-10-CM | POA: Diagnosis not present

## 2023-02-25 LAB — BRAIN NATRIURETIC PEPTIDE: B Natriuretic Peptide: 460 pg/mL — ABNORMAL HIGH (ref 0.0–100.0)

## 2023-02-25 LAB — CBC WITH DIFFERENTIAL/PLATELET
Abs Immature Granulocytes: 0.12 10*3/uL — ABNORMAL HIGH (ref 0.00–0.07)
Basophils Absolute: 0.1 10*3/uL (ref 0.0–0.1)
Basophils Relative: 1 %
Eosinophils Absolute: 0.1 10*3/uL (ref 0.0–0.5)
Eosinophils Relative: 1 %
HCT: 40.2 % (ref 39.0–52.0)
Hemoglobin: 13.3 g/dL (ref 13.0–17.0)
Immature Granulocytes: 1 %
Lymphocytes Relative: 19 %
Lymphs Abs: 1.9 10*3/uL (ref 0.7–4.0)
MCH: 29.9 pg (ref 26.0–34.0)
MCHC: 33.1 g/dL (ref 30.0–36.0)
MCV: 90.3 fL (ref 80.0–100.0)
Monocytes Absolute: 1 10*3/uL (ref 0.1–1.0)
Monocytes Relative: 10 %
Neutro Abs: 6.8 10*3/uL (ref 1.7–7.7)
Neutrophils Relative %: 68 %
Platelets: 239 10*3/uL (ref 150–400)
RBC: 4.45 MIL/uL (ref 4.22–5.81)
RDW: 14 % (ref 11.5–15.5)
WBC: 10 10*3/uL (ref 4.0–10.5)
nRBC: 0 % (ref 0.0–0.2)

## 2023-02-25 LAB — COMPREHENSIVE METABOLIC PANEL
ALT: 17 U/L (ref 0–44)
AST: 34 U/L (ref 15–41)
Albumin: 3 g/dL — ABNORMAL LOW (ref 3.5–5.0)
Alkaline Phosphatase: 33 U/L — ABNORMAL LOW (ref 38–126)
Anion gap: 10 (ref 5–15)
BUN: 11 mg/dL (ref 8–23)
CO2: 22 mmol/L (ref 22–32)
Calcium: 9 mg/dL (ref 8.9–10.3)
Chloride: 105 mmol/L (ref 98–111)
Creatinine, Ser: 1.1 mg/dL (ref 0.61–1.24)
GFR, Estimated: >60 mL/min (ref >60)
Glucose, Bld: 94 mg/dL (ref 70–99)
Potassium: 3.5 mmol/L (ref 3.5–5.1)
Sodium: 137 mmol/L (ref 135–145)
Total Bilirubin: 0.8 mg/dL (ref 0.3–1.2)
Total Protein: 6.7 g/dL (ref 6.5–8.1)

## 2023-02-25 LAB — C-REACTIVE PROTEIN: CRP: 2.3 mg/dL — ABNORMAL HIGH (ref <1.0)

## 2023-02-25 LAB — VITAMIN B1: Vitamin B1 (Thiamine): 126.1 nmol/L (ref 66.5–200.0)

## 2023-02-25 LAB — MAGNESIUM: Magnesium: 1.9 mg/dL (ref 1.7–2.4)

## 2023-02-25 LAB — PROCALCITONIN: Procalcitonin: <0.1 ng/mL

## 2023-02-25 MED ORDER — FENTANYL CITRATE PF 50 MCG/ML IJ SOSY
PREFILLED_SYRINGE | INTRAMUSCULAR | Status: AC
  Administered 2023-02-25: 50 ug
  Filled 2023-02-25: qty 1

## 2023-02-25 MED ORDER — FENTANYL CITRATE PF 50 MCG/ML IJ SOSY
25.0000 ug | PREFILLED_SYRINGE | Freq: Once | INTRAMUSCULAR | Status: AC
  Administered 2023-02-25: 25 ug via INTRAVENOUS

## 2023-02-25 MED ORDER — POTASSIUM CHLORIDE 20 MEQ PO PACK
40.0000 meq | PACK | Freq: Once | ORAL | Status: AC
  Administered 2023-02-25: 40 meq via ORAL
  Filled 2023-02-25: qty 2

## 2023-02-25 MED ORDER — PANTOPRAZOLE SODIUM 40 MG PO TBEC
40.0000 mg | DELAYED_RELEASE_TABLET | Freq: Every day | ORAL | Status: DC
  Administered 2023-02-26 – 2023-03-16 (×19): 40 mg via ORAL
  Filled 2023-02-25 (×19): qty 1

## 2023-02-25 MED ORDER — MAGNESIUM SULFATE IN D5W 1-5 GM/100ML-% IV SOLN
1.0000 g | Freq: Once | INTRAVENOUS | Status: AC
  Administered 2023-02-25: 1 g via INTRAVENOUS
  Filled 2023-02-25: qty 100

## 2023-02-25 NOTE — Progress Notes (Signed)
Neurology Progress Note  Brief HPI: 77 year old patient with history of arthritis, hypertension, hyperlipidemia, BPH, rheumatoid arthritis, COPD, sleep apnea and stroke presented with altered mentation.  He was noted to be quite confused in the ED and required one-to-one sitter for some time.  He denies headache but does endorse some neck pain on rotation and palpation.  He is afebrile and has no leukocytosis, there is some suspicion for viral or rickettsial meningitis given that patient spends a lot of time outdoors.  Subjective: Patient has been hemodynamically stable overnight and is seen in his room without family at the bedside.  He is more disoriented today than yesterday and is unable to state his name or location.  Exam: Vitals:   02/25/23 0400 02/25/23 0900  BP:  (!) 175/95  Pulse:  (!) 103  Resp:  19  Temp: 98.2 F (36.8 C)   SpO2:  92%   Gen: In bed, NAD Resp: non-labored breathing, no acute distress Abd: soft, nt  Neuro: Mental Status: Responds to voice, but is unable to state his name or place, will occasionally follow simple commands but not consistently  Cranial Nerves: Pupils equal round and reactive to light, extraocular movements intact, face symmetrical, phonation normal, hearing intact to voice, tongue midline Motor: Able to move all 4 extremities with good antigravity strength Sensory: Intact to light touch throughout Gait: Deferred  Pertinent Labs:    Latest Ref Rng & Units 02/25/2023    3:24 AM 02/24/2023    7:37 AM 02/23/2023    7:39 AM  CBC  WBC 4.0 - 10.5 K/uL 10.0  9.3  9.0   Hemoglobin 13.0 - 17.0 g/dL 40.9  81.1  91.4   Hematocrit 39.0 - 52.0 % 40.2  41.3  40.6   Platelets 150 - 400 K/uL 239  215  228        Latest Ref Rng & Units 02/25/2023    3:24 AM 02/24/2023    7:37 AM 02/23/2023   12:38 AM  BMP  Glucose 70 - 99 mg/dL 94  98  782   BUN 8 - 23 mg/dL 11  10  14    Creatinine 0.61 - 1.24 mg/dL 9.56  2.13  0.86   Sodium 135 - 145 mmol/L 137  135   140   Potassium 3.5 - 5.1 mmol/L 3.5  3.6  3.4   Chloride 98 - 111 mmol/L 105  105  103   CO2 22 - 32 mmol/L 22  23  25    Calcium 8.9 - 10.3 mg/dL 9.0  9.4  57.8   IV pending Folate 8.4 B12 611 Spotted fever group antibodies pending  Imaging Reviewed:  CT head: No acute abnormality  MRI brain: No acute abnormality, moderate generalized atrophy and chronic white matter disease, remote lacunar infarct of right thalamus and bilateral mastoid effusions  EEG: Generalized irregular slow activity consistent with generalized nonspecific encephalopathy with no seizures or epileptiform discharges  Assessment: 77 year old patient with history of arthritis, hypertension, hyperlipidemia, BPH, rheumatoid arthritis, COPD, sleep apnea and stroke presented originally with altered mentation.  On exam, he was noted to have neck pain with palpation and head rotation as well as confusion.  He does not have a fever or leukocytosis, suspicion is high for viral or rickettsial meningitis given the patient spends a lot of time outdoors.  Patient will need lumbar puncture to evaluate for this.  Unfortunately, patient's wife's phone is broken and we cannot reach her for consent, so we will need to  proceed with two-physician consent for procedure.  However, given that patient is unable to consistently follow commands and is not redirectable today, will need to perform LP under sedation and fluoroscopy.  patient is already on appropriate antibiotics and antivirals for meningitis coverage.  EEG was negative for seizure activity, and imaging of the brain reveals no acute abnormality.  Given persistent altered mental status worsening today, will initiate long-term EEG.  Impression: Altered mental status with differential to include viral or rickettsial meningitis, toxic metabolic encephalopathy, seizure activity and delirium  Recommendations: -Lumbar puncture will need to be performed with sedation under fluoroscopy given  patient's persistent confusion and inability to follow commands, will send CSF cell count, glucose, protein, Gram stain, culture -Continue meningitis coverage with ampicillin, ceftriaxone, vancomycin and acyclovir -Begin long-term EEG  Dennis [name] , MSN, AGACNP-BC [facility] See Amion for schedule and pager information 02/25/2023 12:05 PM  NEUROHOSPITALIST ADDENDUM Performed a face to face diagnostic evaluation.   I have reviewed the contents of history and physical exam as documented by PA/ARNP/Resident and agree with above documentation.  I have discussed and formulated the above plan as documented. Edits to the note have been made as needed.  Impression/Key exam findings/Plan:: continues to be encephalopathic, waxing and waning. 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 Will put him up on LTM to see if he is having subclinical seizures. Attempted LP at bedside aftergiving him some fentanyl to help with both pain and sedation. Attempted at L4-5. However, unsuccessful and we had to stop given how much he was moving. Will plan to get LP under fluoro.  Erick Blinks, MD [facility] [phone]   If 7pm to 7am, please call on call as listed on AMION.

## 2023-02-25 NOTE — Progress Notes (Addendum)
PROGRESS NOTE                                                                                                                                                                                                             Patient Demographics:    Dennis [name], is a 77 y.o. male, DOB - 1945-06-30, FAO:[id number]  Outpatient Primary MD for the patient is Pcp, No    LOS - 3  Admit date - 02/22/2023    Chief Complaint  Patient presents with   Altered Mental Status       Brief Narrative (HPI from H&P)   77 year old male with past medical history of HLD, BPH, HTN, rheumatoid arthritis, COPD, Barrett's esophagus, OSA does not use CPAP, history of CVA. Patient sent in for altered mentation. At baseline patient has a ataxic gait but is not confused. Over the course of the past week he has become increasingly confused. He was brought to the ER.  In the ER he was trying to escape from the ER room had to be IVC need placed with 1 is to 1 sitter, had 2 CT scans of the head which were unremarkable, he was admitted for further workup of encephalopathy.   Subjective:   Patient in bed, awake around 10 AM, denies any headache or chest pain.   Assessment  & Plan :   Gradually progressive metabolic encephalopathy over the course of 5 to 7 days.  Brought in by wife for worsening confusion, confusion persisted in the ER, CT head x 2 several hours apart is nonacute, no focal deficits apparent, unable to provide any history, wife called twice on 02/23/2023 but phone switched off.  He is currently afebrile, no leukocytosis, CRP and ESR are stable, head CT x 2 stable, ABG stable, RPR stable, ammonia stable, stable B12, TSH, A1c, MRI and EEG, he may require LP to rule out atypical meningitis, bacterial meningitis highly unlikely based on presentation, patient does go out hunting and I think is at risk for possible rickettsial meningitis versus viral  meningitis/encephalitis.  He had been placed on Rocephin morning of 02/23/2023, seen by neurology and antibiotics have been broadened, case discussed with neurologist in detail morning of 02/23/2023 and again on 02/24/2023, they are contemplating LP to be done by neurology they are awaiting consent to be obtained, wife is unreachable they  are trying to reach the neighbor, if needed physician consent has been provided.  Continue supportive care with as needed Haldol.   Dehydration, reduced urine output, AKI.  Poor oral intake likely due to confusion as above, hold ARB, hydrate with IV fluids.  Bladder scan has 53 cc of urine.  History of rheumatoid arthritis.  Post discharge resume methotrexate and sulfasalazine.  Stable CRP ESR.  BPH.  Proscar and Flomax if he can tolerate oral medications.  Hypertension.  Continue Coreg, ARB held due to AKI, as needed hydralazine.  Dyslipidemia.  Check a.m. lipid panel.  Hypokalemia.  Replaced.  Nonspecific EKG changes.  Denies any chest pain.  Coreg and statin, noted with EF slightly depressed and some wall motion abnormalities, have to hold antiplatelets due to pending LP.  Troponin trend was stable and in non-ACS pattern, once mentation improves cardiology input.       Condition - Extremely Guarded  Family Communication  : Called wife [name] [phone] twice on 02/23/2023 at 8 AM and 10 AM, no response, message left on answering machine on her cell phone.  Wife informs me that her cell phone is broken, discussed with her in detail in room on 02/23/2023.  [name],[name] Biomedical engineer) [phone] (Mobile) was updated message left morning of 02/24/2023 at 10:04 AM, called again afternoon of 02/24/2023, call 02/25/2023 at 10:22 AM message left  Called on 02/25/2023 [phone]  at 11 AM over 20 rings, no response   Code Status :  Full  Consults  :  Neuro  PUD Prophylaxis :  PPI   Procedures  :     LP -   Echocardiogram.  1. Left ventricular ejection  fraction, by estimation, is 45 to 50%. Left ventricular ejection fraction by 2D MOD biplane is 49.5 %. The left ventricle has mildly decreased function. The left ventricle demonstrates regional wall motion abnormalities (see  scoring diagram/findings for description). There is mild left ventricular hypertrophy. Left ventricular diastolic function could not be evaluated.  2. Right ventricular systolic function is normal. The right ventricular size is normal.  3. The mitral valve is normal in structure. No evidence of mitral valve regurgitation. No evidence of mitral stenosis.  4. The aortic valve is normal in structure. There is moderate calcification of the aortic valve. There is moderate thickening of the aortic valve. Aortic valve regurgitation is trivial. No aortic stenosis is present.  5. The inferior vena cava is normal in size with greater than 50% respiratory variability, suggesting right atrial pressure of 3 mmHg.   CT head x 2.  Nonacute.    MRI brain.  1. No acute intracranial abnormality. 2. Moderate generalized atrophy and white matter disease likely reflects the sequela of chronic microvascular ischemia. 3. Remote lacunar infarct of the right thalamus. 4. Bilateral mastoid effusions. No obstructing nasopharyngeal lesion is present  EEG.  No active seizures      Disposition Plan  :    Status is: Inpatient  DVT Prophylaxis  :    Place and maintain sequential compression device Start: 02/23/23 1015    Lab Results  Component Value Date   PLT 239 02/25/2023    Diet :  Diet Order             DIET DYS 3 Room service appropriate? Yes; Fluid consistency: Thin  Diet effective now                    Inpatient Medications  Scheduled Meds:  atorvastatin  40  mg Oral Daily   carvedilol  3.125 mg Oral BID WC   finasteride  5 mg Oral Daily   folic acid  1 mg Oral Daily   pantoprazole (PROTONIX) IV  40 mg Intravenous Q24H   tamsulosin  0.4 mg Oral QPC supper   Continuous  Infusions:  acyclovir 680 mg (02/24/23 2127)   ampicillin (OMNIPEN) IV 2 g (02/25/23 0503)   cefTRIAXone (ROCEPHIN)  IV 2 g (02/25/23 0957)   lactated ringers 50 mL/hr at 02/24/23 1414   vancomycin 1,000 mg (02/24/23 2128)   PRN Meds:.acetaminophen **OR** acetaminophen, haloperidol lactate, hydrALAZINE, ondansetron **OR** ondansetron (ZOFRAN) IV, senna-docusate     Objective:   Vitals:   02/24/23 2305 02/25/23 0000 02/25/23 0400 02/25/23 0900  BP:    (!) 175/95  Pulse:    (!) 103  Resp:    19  Temp: 98.2 F (36.8 C) 97.7 F (36.5 C) 98.2 F (36.8 C)   TempSrc: Oral Oral Oral   SpO2:    92%  Weight:      Height:        Wt Readings from Last 3 Encounters:  02/22/23 68 kg  02/21/23 68 kg  12/25/22 68 kg     Intake/Output Summary (Last 24 hours) at 02/25/2023 1021 Last data filed at 02/25/2023 0700 Gross per 24 hour  Intake 1664.25 ml  Output 1910 ml  Net -245.75 ml     Physical Exam  Awake mildly confused but better than yesterday, supple neck, No new F.N deficits,   [place].AT,PERRAL Supple Neck, No JVD,   Symmetrical Chest wall movement, Good air movement bilaterally, CTAB RRR,No Gallops,Rubs or new Murmurs,  +ve B.Sounds, Abd Soft, No tenderness,   No Cyanosis, Clubbing or edema        Data Review:    Recent Labs  Lab 02/21/23 1254 02/21/23 1304 02/22/23 2128 02/23/23 0038 02/23/23 0739 02/24/23 0737 02/25/23 0324  WBC 8.1   < > 9.2 9.3 9.0 9.3 10.0  HGB 13.4   < > 13.2 13.6 13.0 13.3 13.3  HCT 43.2   < > 40.8 42.7 40.6 41.3 40.2  PLT 307   < > 232 236 228 215 239  MCV 92.3   < > 89.7 89.9 93.3 91.2 90.3  MCH 28.6   < > 29.0 28.6 29.9 29.4 29.9  MCHC 31.0   < > 32.4 31.9 32.0 32.2 33.1  RDW 14.3   < > 14.0 13.9 14.0 14.0 14.0  LYMPHSABS 2.6  --   --  1.5 2.0 1.7 1.9  MONOABS 1.0  --   --  0.6 1.2* 0.8 1.0  EOSABS 0.2  --   --  0.0 0.0 0.1 0.1  BASOSABS 0.1  --   --  0.0 0.1 0.0 0.1   < > = values in this interval not displayed.    Recent Labs   Lab 02/21/23 1254 02/21/23 1304 02/22/23 1332 02/22/23 2128 02/23/23 0038 02/23/23 0452 02/23/23 1053 02/23/23 1059 02/24/23 0737 02/25/23 0324  NA 138 140 139  --  140  --   --   --  135 137  K 4.3 4.4 4.0  --  3.4*  --   --   --  3.6 3.5  CL 102 103 102  --  103  --   --   --  105 105  CO2 30  --  26  --  25  --   --   --  23 22  ANIONGAP 6  --  11  --  12  --   --   --  7 10  GLUCOSE 101* 95 94  --  122*  --   --   --  98 94  BUN 15 17 13   --  14  --   --   --  10 11  CREATININE 1.17 1.20 1.19 1.23 1.33*  --   --   --  1.21 1.10  AST 21  --  20  --   --   --   --   --  30 34  ALT 17  --  15  --   --   --   --   --  16 17  ALKPHOS 34*  --  34*  --   --   --   --   --  29* 33*  BILITOT 0.6  --  0.3  --   --   --   --   --  0.6 0.8  ALBUMIN 3.5  --  3.5  --   --   --   --   --  2.9* 3.0*  CRP  --   --   --   --  0.5  --   --   --  0.8 2.3*  PROCALCITON  --   --   --   --   --   --  <0.10  --  <0.10 <0.10  INR 1.0  --   --   --   --   --   --   --   --   --   TSH  --   --   --  3.180  --   --   --   --   --   --   HGBA1C  --   --   --   --   --   --   --  5.5  --   --   AMMONIA  --   --   --   --   --  12  --   --   --   --   BNP  --   --   --   --   --   --   --   --  408.6* 460.0*  MG  --   --   --   --   --   --   --   --  1.7 1.9  CALCIUM 11.6*  --  11.6*  --  10.8*  --   --   --  9.4 9.0      Recent Labs  Lab 02/21/23 1254 02/22/23 1332 02/22/23 2128 02/23/23 0038 02/23/23 0452 02/23/23 1053 02/23/23 1059 02/24/23 0737 02/25/23 0324  CRP  --   --   --  0.5  --   --   --  0.8 2.3*  PROCALCITON  --   --   --   --   --  <0.10  --  <0.10 <0.10  INR 1.0  --   --   --   --   --   --   --   --   TSH  --   --  3.180  --   --   --   --   --   --   HGBA1C  --   --   --   --   --   --  5.5  --   --   AMMONIA  --   --   --   --  12  --   --   --   --   BNP  --   --   --   --   --   --   --  408.6* 460.0*  MG  --   --   --   --   --   --   --  1.7 1.9  CALCIUM 11.6*  11.6*  --  10.8*  --   --   --  9.4 9.0    --------------------------------------------------------------------------------------------------------------- Lab Results  Component Value Date   CHOL 152 02/24/2023   HDL 28 (L) 02/24/2023   LDLCALC 105 (H) 02/24/2023   TRIG 94 02/24/2023   CHOLHDL 5.4 02/24/2023    Lab Results  Component Value Date   HGBA1C 5.5 02/23/2023   Recent Labs    02/22/23 2128  TSH 3.180   Recent Labs    02/23/23 1053 02/23/23 1107  VITAMINB12 611  --   FOLATE  --  8.4   ------------------------------------------------------------------------------------------------------------------ Cardiac Enzymes No results for input(s): "CKMB", "TROPONINI", "MYOGLOBIN" in the last 168 hours.  Invalid input(s): "CK"  Micro Results Recent Results (from the past 240 hour(s))  MRSA Next Gen by PCR, Nasal     Status: None   Collection Time: 02/23/23  3:31 AM   Specimen: Nasal Mucosa; Nasal Swab  Result Value Ref Range Status   MRSA by PCR Next Gen NOT DETECTED NOT DETECTED Final    Comment: (NOTE) The GeneXpert MRSA Assay (FDA approved for NASAL specimens only), is one component of a comprehensive MRSA colonization surveillance program. It is not intended to diagnose MRSA infection nor to guide or monitor treatment for MRSA infections. Test performance is not FDA approved in patients less than 62 years old. Performed at [facility], [address], [place], [place] [address]     Radiology Reports ECHOCARDIOGRAM COMPLETE  Result Date: 02/24/2023    ECHOCARDIOGRAM REPORT   Patient Name:   BALDO HUFNAGLE Date of Exam: 02/24/2023 Medical Rec #:  [id number]     Height:       64.0 in Accession #:    [id number]    Weight:       149.9 lb Date of Birth:  23-Feb-1946     BSA:          1.731 m Patient Age:    77 years      BP:           118/94 mmHg Patient Gender: M             HR:           88 bpm. Exam Location:  Inpatient Procedure: 2D Echo, Color Doppler and  Cardiac Doppler Indications:    Abnormal ECG  History:        Patient has no prior history of Echocardiogram examinations.                 Signs/Symptoms:Altered Mental Status; Risk Factors:Hypertension.  Sonographer:    Milda Smart Referring Phys: [facility]  Sonographer Comments: Image acquisition challenging due to patient body habitus. Pt scanned supine due to restraints. IMPRESSIONS  1. Left ventricular ejection fraction, by estimation, is 45 to 50%. Left ventricular ejection fraction by 2D MOD biplane is 49.5 %. The left ventricle has mildly decreased function. The left ventricle demonstrates regional wall motion abnormalities (see  scoring diagram/findings for description). There is mild left ventricular hypertrophy. Left ventricular diastolic function could not be evaluated.  2. Right ventricular systolic function is normal. The right ventricular size is normal.  3. The mitral valve is normal in structure. No evidence of mitral valve regurgitation. No evidence of mitral stenosis.  4. The aortic valve is normal in structure. There is moderate calcification of the aortic valve. There is moderate thickening of the aortic valve. Aortic valve regurgitation is trivial. No aortic stenosis is present.  5. The inferior vena cava is normal in size with greater than 50% respiratory variability, suggesting right atrial pressure of 3 mmHg. FINDINGS  Left Ventricle: Left ventricular ejection fraction, by estimation, is 45 to 50%. Left ventricular ejection fraction by 2D MOD biplane is 49.5 %. The left ventricle has mildly decreased function. The left ventricle demonstrates regional wall motion abnormalities. The left ventricular internal cavity size was normal in size. There is mild left ventricular hypertrophy. Left ventricular diastolic function could not be evaluated.  LV Wall Scoring: The entire anterior wall and entire septum are hypokinetic. The entire lateral wall, entire inferior wall, and apex are  normal. Right Ventricle: The right ventricular size is normal. No increase in right ventricular wall thickness. Right ventricular systolic function is normal. Left Atrium: Left atrial size was normal in size. Right Atrium: Right atrial size was normal in size. Pericardium: There is no evidence of pericardial effusion. Mitral Valve: The mitral valve is normal in structure. No evidence of mitral valve regurgitation. No evidence of mitral valve stenosis. Tricuspid Valve: The tricuspid valve is normal in structure. Tricuspid valve regurgitation is not demonstrated. No evidence of tricuspid stenosis. Aortic Valve: The aortic valve is normal in structure. There is moderate calcification of the aortic valve. There is moderate thickening of the aortic valve. Aortic valve regurgitation is trivial. No aortic stenosis is present. Pulmonic Valve: The pulmonic valve was normal in structure. Pulmonic valve regurgitation is not visualized. No evidence of pulmonic stenosis. Aorta: The aortic root is normal in size and structure. Venous: The inferior vena cava is normal in size with greater than 50% respiratory variability, suggesting right atrial pressure of 3 mmHg. IAS/Shunts: No atrial level shunt detected by color flow Doppler.  LEFT VENTRICLE PLAX 2D                        Biplane EF (MOD) LVIDd:         4.10 cm         LV Biplane EF:   Left LVIDs:         3.20 cm                          ventricular LV PW:         1.00 cm                          ejection LV IVS:        1.20 cm                          fraction by LVOT diam:     2.40 cm                          2D MOD LVOT Area:     4.52 cm  biplane is                                                 49.5 %.  LV Volumes (MOD)               Diastology LV vol d, MOD    39.7 ml       LV e' medial:  5.66 cm/s A2C:                           LV e' lateral: 6.53 cm/s LV vol d, MOD    51.4 ml A4C: LV vol s, MOD    22.3 ml A2C: LV vol s, MOD    21.5 ml A4C: LV SV  MOD A2C:   17.4 ml LV SV MOD A4C:   51.4 ml LV SV MOD BP:    22.7 ml RIGHT VENTRICLE RV S prime:     14.90 cm/s TAPSE (M-mode): 1.8 cm LEFT ATRIUM           Index        RIGHT ATRIUM          Index LA diam:      4.30 cm 2.48 cm/m   RA Area:     6.91 cm LA Vol (A2C): 20.7 ml 11.96 ml/m  RA Volume:   10.10 ml 5.84 ml/m LA Vol (A4C): 26.2 ml 15.14 ml/m   AORTA Ao Root diam: 3.50 cm  SHUNTS Systemic Diam: 2.40 cm Chilton Si MD Electronically signed by Chilton Si MD Signature Date/Time: 02/24/2023/4:45:53 PM    Final    MR BRAIN W WO CONTRAST  Result Date: 02/23/2023 CLINICAL DATA:  Delirium. EXAM: MRI HEAD WITHOUT AND WITH CONTRAST TECHNIQUE: Multiplanar, multiecho pulse sequences of the brain and surrounding structures were obtained without and with intravenous contrast. CONTRAST:  7mL GADAVIST GADOBUTROL 1 MMOL/ML IV SOLN COMPARISON:  CT head without contrast 02/22/2023. FINDINGS: Brain: No acute infarct, hemorrhage, or mass lesion is present. Moderate generalized atrophy and diffuse periventricular white matter changes are present bilaterally. Dilated perivascular spaces are present within the basal ganglia. Remote lacunar infarct is present in the right thalamus. The brainstem and cerebellum are within normal limits. The ventricles are proportionate to the degree of atrophy. No significant extraaxial fluid collection is present. Midline structures are within normal limits. Postcontrast images demonstrate no pathologic enhancement. Vascular: Flow is present in the major intracranial arteries. Skull and upper cervical spine: The craniocervical junction is normal. Upper cervical spine is within normal limits. Marrow signal is unremarkable. Sinuses/Orbits: Bilateral mastoid effusions are present. No obstructing nasopharyngeal lesion is present. A polyp or mucous retention cyst is present within an atelectatic right maxillary sinus. Paranasal sinuses are otherwise clear. The globes and orbits are  within normal limits. IMPRESSION: 1. No acute intracranial abnormality. 2. Moderate generalized atrophy and white matter disease likely reflects the sequela of chronic microvascular ischemia. 3. Remote lacunar infarct of the right thalamus. 4. Bilateral mastoid effusions. No obstructing nasopharyngeal lesion is present. Electronically Signed   By: Marin Roberts M.D.   On: 02/23/2023 17:20   CT Head Wo Contrast  Result Date: 02/22/2023 CLINICAL DATA:  Altered mental status EXAM: CT HEAD WITHOUT CONTRAST TECHNIQUE: Contiguous axial images were obtained from the base of the skull through the vertex without intravenous contrast. RADIATION  DOSE REDUCTION: This exam was performed according to the departmental dose-optimization program which includes automated exposure control, adjustment of the mA and/or kV according to patient size and/or use of iterative reconstruction technique. COMPARISON:  CT 12/25/2022, 02/21/2023 FINDINGS: Brain: No acute territorial infarction, hemorrhage or intracranial mass. Atrophy and advanced chronic small vessel ischemic changes of the white matter. More focal right posterior frontal hypodensity, series 2, image 21, also chronic. Age indeterminate lacunar infarct in the head of left caudate, suspect chronic. Ventricles are stable in size. Vascular: No hyperdense vessels. Vertebral and carotid vascular calcification Skull: Normal. Negative for fracture or focal lesion. Sinuses/Orbits: No acute finding. Other: None IMPRESSION: 1. No CT evidence for acute intracranial abnormality. 2. Atrophy and advanced chronic small vessel ischemic changes of the white matter. Age indeterminate lacunar infarct within the left caudate head. Electronically Signed   By: Jasmine Pang M.D.   On: 02/22/2023 18:01   CT HEAD WO CONTRAST  Result Date: 02/21/2023 CLINICAL DATA:  Altered mental status, stroke suspected EXAM: CT HEAD WITHOUT CONTRAST TECHNIQUE: Contiguous axial images were obtained from  the base of the skull through the vertex without intravenous contrast. RADIATION DOSE REDUCTION: This exam was performed according to the departmental dose-optimization program which includes automated exposure control, adjustment of the mA and/or kV according to patient size and/or use of iterative reconstruction technique. COMPARISON:  12/25/2022 FINDINGS: Brain: No evidence of acute infarction, hemorrhage, hydrocephalus, extra-axial collection or mass lesion/mass effect. Periventricular and deep white matter hypodensity. Vascular: No hyperdense vessel or unexpected calcification. Skull: Normal. Negative for fracture or focal lesion. Sinuses/Orbits: No acute finding. Other: None. IMPRESSION: No acute intracranial pathology. Small-vessel white matter disease. MRI may be used to more sensitively evaluate for acute diffusion restricting infarction if clinically suspected. Electronically Signed   By: Jearld Lesch M.D.   On: 02/21/2023 15:36      Signature  -   Susa Raring M.D on 02/25/2023 at 10:21 AM   -  To page go to [email or web address]

## 2023-02-25 NOTE — TOC Progression Note (Signed)
Transition of Care [facility]) - Progression Note    Patient Details  Name: Dennis [name] MRN: [id number] Date of Birth: [birth year]  [facility] CM/SW Contact  Mearl Latin, LCSW Phone Number: 02/25/2023, 4:13 PM  Clinical Narrative:    CSW received call from APS, Alda Lea, and she stated she has not been able to reach spouse yet but to let her know if she comes to the hospital tomorrow since she does not have a phone.      Barriers to Discharge: Continued Medical Work up, Requiring sitter/restraints, SNF Pending bed offer, Unsafe home situation  Expected Discharge Plan and Services In-house Referral: Clinical Social Work   Post Acute Care Choice: Skilled Nursing Facility Living arrangements for the past 2 months: Single Family Home                                       Social Determinants of Health (SDOH) Interventions SDOH Screenings   Food Insecurity: No Food Insecurity (07/26/2020)   Received from [facility], [facility]  Transportation Needs: No Transportation Needs (04/04/2022)   Received from [facility], [facility]  Tobacco Use: Medium Risk (02/22/2023)    Readmission Risk Interventions     No data to display

## 2023-02-25 NOTE — Progress Notes (Signed)
vLTm Started  all impedances below 10kohms  Atrium to monitor  Patient event button tested

## 2023-02-25 NOTE — Care Management Important Message (Signed)
Important Message  Patient Details  Name: Dennis [name] MRN: [id number] Date of Birth: 1945-10-23   Important Message Given:  Yes - Medicare IM     Dorena Bodo 02/25/2023, 1:48 PM

## 2023-02-25 NOTE — Plan of Care (Signed)
  Problem: Safety: Goal: Non-violent Restraint(s) Outcome: Progressing   Problem: Safety: Goal: Non-violent Restraint(s) Outcome: Progressing   Problem: Clinical Measurements: Goal: Ability to maintain clinical measurements within normal limits will improve Outcome: Progressing Goal: Will remain free from infection Outcome: Progressing Goal: Diagnostic test results will improve Outcome: Progressing

## 2023-02-25 NOTE — Evaluation (Signed)
Occupational Therapy Evaluation Patient Details Name: Dennis [name] MRN: [id number] DOB: 04-01-1946 Today's Date: 02/25/2023   History of Present Illness 77 yo male presenting to ED 10/5 with AMS. CTH and MRI brain negative. PMH includes HLD, BPH, HTN, rheumatoid arthritis, COPD, Barrett's esophagus, OSA, prior CVA,  dysarthria, LLE weakness, dementia.   Clinical Impression   Dennis [name] was evaluated s/p the above admission list. Per chart he is indep at baseline, also reports falls. Upon evaluation the pt was limited by significantly impaired cognition, unable to sequence functional tasks, nonsensical speech, poor balance and limited activity tolerance. Overall he needed maximal assist to come EOB and fair sitting balance, he stood with mod A and took lateral steps with max A with posterior LOBs. Due to the deficits listed below the pt also needs max-totalA for all aspects of ADLs. Pt will benefit from continued acute OT services and skilled inpatient follow up therapy, <3 hours/day.        If plan is discharge home, recommend the following: A lot of help with walking and/or transfers;Two people to help with walking and/or transfers;A lot of help with bathing/dressing/bathroom;Two people to help with bathing/dressing/bathroom;Direct supervision/assist for medications management;Direct supervision/assist for financial management;Assist for transportation;Help with stairs or ramp for entrance;Supervision due to cognitive status    Functional Status Assessment  Patient has had a recent decline in their functional status and demonstrates the ability to make significant improvements in function in a reasonable and predictable amount of time.  Equipment Recommendations  Other (comment) (defer)       Precautions / Restrictions Precautions Precautions: Fall Precaution Comments: 4 pt restraints Restrictions Weight Bearing Restrictions: No      Mobility Bed Mobility Overal bed mobility: Needs  Assistance Bed Mobility: Supine to Sit, Sit to Supine     Supine to sit: Max assist Sit to supine: Max assist   General bed mobility comments: due to poor cognition, needed maximal multimodal cues and assist to successfully get to EOB    Transfers Overall transfer level: Needs assistance Equipment used: 1 person hand held assist Transfers: Sit to/from Stand Sit to Stand: Mod assist           General transfer comment: max A to take lateral steps to [facility]      Balance Overall balance assessment: Needs assistance Sitting-balance support: Feet supported Sitting balance-Leahy Scale: Fair     Standing balance support: Single extremity supported, During functional activity Standing balance-Leahy Scale: Poor Standing balance comment: unable to sustain static balance at EOB                           ADL either performed or assessed with clinical judgement   ADL Overall ADL's : Needs assistance/impaired Eating/Feeding: Maximal assistance   Grooming: Modified independent   Upper Body Bathing: Total assistance   Lower Body Bathing: Total assistance   Upper Body Dressing : Maximal assistance   Lower Body Dressing: Total assistance   Toilet Transfer: Maximal assistance;Stand-pivot   Toileting- Clothing Manipulation and Hygiene: Total assistance       Functional mobility during ADLs: Maximal assistance General ADL Comments: requires maximal cues and assist to physical participate in any functional task.     Vision Baseline Vision/History: 1 Wears glasses Vision Assessment?: No apparent visual deficits Additional Comments: unable to assess due to poor cognition     Perception Perception: Not tested       Praxis Praxis: Not tested  Pertinent Vitals/Pain Pain Assessment Pain Assessment: Faces Faces Pain Scale: No hurt Pain Intervention(s): Monitored during session     Extremity/Trunk Assessment Upper Extremity Assessment Upper Extremity  Assessment: Generalized weakness;Difficult to assess due to impaired cognition   Lower Extremity Assessment Lower Extremity Assessment: Defer to PT evaluation   Cervical / Trunk Assessment Cervical / Trunk Assessment: Kyphotic   Communication Communication Communication: Difficulty communicating thoughts/reduced clarity of speech;Difficulty following commands/understanding   Cognition Arousal: Alert Behavior During Therapy: Restless Overall Cognitive Status: Impaired/Different from baseline Area of Impairment: Orientation, Attention, Memory, Following commands, Safety/judgement, Awareness, Problem solving                 Orientation Level: Disoriented to, Place, Time, Situation Current Attention Level: Focused Memory: Decreased recall of precautions, Decreased short-term memory Following Commands: Follows one step commands inconsistently Safety/Judgement: Decreased awareness of safety, Decreased awareness of deficits Awareness: Intellectual   General Comments: able to state first name, tangentail incoherent speech throughout. requires multimodal cues to assist with any task.     General Comments  on RA upon arrival SpO2 92-95%     Home Living Family/patient expects to be discharged to:: Private residence Living Arrangements: Spouse/significant other                   Additional Comments: Pt unable to reliably answer questions for set-up      Prior Functioning/Environment Prior Level of Function : Patient poor historian/Family not available;History of Falls (last six months)             Mobility Comments: chart indicates a history of passing out and falling          OT Problem List: Decreased strength;Decreased range of motion;Decreased activity tolerance;Impaired balance (sitting and/or standing);Decreased safety awareness;Decreased cognition;Decreased knowledge of use of DME or AE;Decreased knowledge of precautions      OT Treatment/Interventions:  Self-care/ADL training;Therapeutic exercise;DME and/or AE instruction;Therapeutic activities;Balance training;Patient/family education;Cognitive remediation/compensation    OT Goals(Current goals can be found in the care plan section) Acute Rehab OT Goals Patient Stated Goal: unable to state OT Goal Formulation: Patient unable to participate in goal setting Time For Goal Achievement: 03/11/23 Potential to Achieve Goals: Good ADL Goals Pt Will Perform Grooming: with min assist;sitting Pt Will Transfer to Toilet: with min assist;ambulating Additional ADL Goal #1: Pt will follow simple 1 step commands 50% of the time to complete functional tasks Additional ADL Goal #2: Pt will sequence through simple ADL with min cues  OT Frequency: Min 1X/week       AM-PAC OT "6 Clicks" Daily Activity     Outcome Measure Help from another person eating meals?: A Lot Help from another person taking care of personal grooming?: A Lot Help from another person toileting, which includes using toliet, bedpan, or urinal?: Total Help from another person bathing (including washing, rinsing, drying)?: Total Help from another person to put on and taking off regular upper body clothing?: A Lot Help from another person to put on and taking off regular lower body clothing?: Total 6 Click Score: 9   End of Session Equipment Utilized During Treatment: Gait belt Nurse Communication: Mobility status  Activity Tolerance: Patient tolerated treatment well Patient left: in bed;with call bell/phone within reach;with restraints reapplied;with bed alarm set  OT Visit Diagnosis: Unsteadiness on feet (R26.81);Other abnormalities of gait and mobility (R26.89);Muscle weakness (generalized) (M62.81)                Time: 1050-1108 OT Time Calculation (min): 18  min Charges:  OT General Charges $OT Visit: 1 Visit OT Evaluation $OT Eval Moderate Complexity: 1 Mod  Derenda Mis, OTR/L Acute Rehabilitation Services Office  [phone] Secure Chat Communication Preferred   [name] 02/25/2023, 11:28 AM

## 2023-02-25 NOTE — Procedures (Signed)
Procedure Note: Lumbar puncture  Indications: Obtain CSF sample to test for meningitis  Operator:  de Saintclair Halsted, NP  Others present: Erick Blinks, MD  Indications, risks, and benefits explained to patient / surrogate decision maker and informed consent obtained. Time-out was performed, with all individuals present agreeing on the procedure to be performed, the site of procedure, and the patient identity.  Patient positioned, prepped and draped in usual sterile fashion. L3-4 space located using bilateral iliac crests as landmarks. 1% Lidocaine without epinephrine was used to anesthetize the area.  Attempted to introduce a 20-gauge spinal needle in the subarachnoid space, but attempt was unfortunately unsuccessful.  A dry guaze dressing was placed over insertion site. Patient tolerated the procedure well and no complications were observed. Spontaneous movement of bilateral extremities were observed after the procedure.   E Ernestina Columbia , MSN, AGACNP-BC [facility] See Amion for schedule and pager information 02/25/2023 5:05 PM   I was present for the entire time and supervised the LP. It was unsuccessful at bedside and no CSF obtained.  Erick Blinks [facility]

## 2023-02-26 ENCOUNTER — Inpatient Hospital Stay (HOSPITAL_COMMUNITY): Payer: 59

## 2023-02-26 DIAGNOSIS — G039 Meningitis, unspecified: Secondary | ICD-10-CM | POA: Diagnosis not present

## 2023-02-26 DIAGNOSIS — R410 Disorientation, unspecified: Secondary | ICD-10-CM | POA: Diagnosis not present

## 2023-02-26 LAB — CBC WITH DIFFERENTIAL/PLATELET
Abs Immature Granulocytes: 0.03 10*3/uL (ref 0.00–0.07)
Basophils Absolute: 0.1 10*3/uL (ref 0.0–0.1)
Basophils Relative: 1 %
Eosinophils Absolute: 0.1 10*3/uL (ref 0.0–0.5)
Eosinophils Relative: 1 %
HCT: 35.1 % — ABNORMAL LOW (ref 39.0–52.0)
Hemoglobin: 11.7 g/dL — ABNORMAL LOW (ref 13.0–17.0)
Immature Granulocytes: 0 %
Lymphocytes Relative: 22 %
Lymphs Abs: 1.9 10*3/uL (ref 0.7–4.0)
MCH: 29.8 pg (ref 26.0–34.0)
MCHC: 33.3 g/dL (ref 30.0–36.0)
MCV: 89.3 fL (ref 80.0–100.0)
Monocytes Absolute: 0.9 10*3/uL (ref 0.1–1.0)
Monocytes Relative: 11 %
Neutro Abs: 5.5 10*3/uL (ref 1.7–7.7)
Neutrophils Relative %: 65 %
Platelets: 224 10*3/uL (ref 150–400)
RBC: 3.93 MIL/uL — ABNORMAL LOW (ref 4.22–5.81)
RDW: 14.1 % (ref 11.5–15.5)
WBC: 8.6 10*3/uL (ref 4.0–10.5)
nRBC: 0 % (ref 0.0–0.2)

## 2023-02-26 LAB — COMPREHENSIVE METABOLIC PANEL
ALT: 14 U/L (ref 0–44)
AST: 32 U/L (ref 15–41)
Albumin: 2.5 g/dL — ABNORMAL LOW (ref 3.5–5.0)
Alkaline Phosphatase: 27 U/L — ABNORMAL LOW (ref 38–126)
Anion gap: 9 (ref 5–15)
BUN: 10 mg/dL (ref 8–23)
CO2: 24 mmol/L (ref 22–32)
Calcium: 8.6 mg/dL — ABNORMAL LOW (ref 8.9–10.3)
Chloride: 107 mmol/L (ref 98–111)
Creatinine, Ser: 1.24 mg/dL (ref 0.61–1.24)
GFR, Estimated: 60 mL/min — ABNORMAL LOW (ref >60)
Glucose, Bld: 87 mg/dL (ref 70–99)
Potassium: 3.7 mmol/L (ref 3.5–5.1)
Sodium: 140 mmol/L (ref 135–145)
Total Bilirubin: 0.4 mg/dL (ref 0.3–1.2)
Total Protein: 5.7 g/dL — ABNORMAL LOW (ref 6.5–8.1)

## 2023-02-26 LAB — MENINGITIS/ENCEPHALITIS PANEL (CSF)

## 2023-02-26 LAB — PROTEIN AND GLUCOSE, CSF
Glucose, CSF: 56 mg/dL (ref 40–70)
Total  Protein, CSF: 48 mg/dL — ABNORMAL HIGH (ref 15–45)

## 2023-02-26 LAB — CSF CELL COUNT WITH DIFFERENTIAL
Eosinophils, CSF: 3 % — ABNORMAL HIGH (ref 0–1)
Lymphs, CSF: 19 % — ABNORMAL LOW (ref 40–80)
Monocyte-Macrophage-Spinal Fluid: 6 % — ABNORMAL LOW (ref 15–45)
Other Cells, CSF: 0
RBC Count, CSF: 2000 /mm3 — ABNORMAL HIGH
RBC Count, CSF: 9000 /mm3 — ABNORMAL HIGH
Segmented Neutrophils-CSF: 72 % — ABNORMAL HIGH (ref 0–6)
Tube #: 1
Tube #: 4
WBC, CSF: 17 /mm3 (ref 0–5)
WBC, CSF: 7 /mm3 — ABNORMAL HIGH (ref 0–5)

## 2023-02-26 LAB — MAGNESIUM: Magnesium: 1.9 mg/dL (ref 1.7–2.4)

## 2023-02-26 LAB — PROCALCITONIN: Procalcitonin: <0.1 ng/mL

## 2023-02-26 LAB — GLUCOSE, CAPILLARY: Glucose-Capillary: 78 mg/dL (ref 70–99)

## 2023-02-26 LAB — BRAIN NATRIURETIC PEPTIDE: B Natriuretic Peptide: 965.2 pg/mL — ABNORMAL HIGH (ref 0.0–100.0)

## 2023-02-26 LAB — SPOTTED FEVER GROUP ANTIBODIES
Spotted Fever Group IgG: <1:64 {titer}
Spotted Fever Group IgM: <1:64 {titer}

## 2023-02-26 LAB — C-REACTIVE PROTEIN: CRP: 4.3 mg/dL — ABNORMAL HIGH (ref <1.0)

## 2023-02-26 MED ORDER — LACTATED RINGERS IV SOLN: INTRAVENOUS | Status: AC

## 2023-02-26 MED ORDER — LIDOCAINE HCL (PF) 1 % IJ SOLN
5.0000 mL | Freq: Once | INTRAMUSCULAR | Status: AC
  Administered 2023-02-26: 2 mL

## 2023-02-26 MED ORDER — LORAZEPAM 2 MG/ML IJ SOLN
1.0000 mg | Freq: Once | INTRAMUSCULAR | Status: AC | PRN
  Administered 2023-02-26: 1 mg via INTRAVENOUS
  Filled 2023-02-26: qty 1

## 2023-02-26 NOTE — Progress Notes (Signed)
vLTM discontinued  No skin breakdown noted at d/c  [facility] notified

## 2023-02-26 NOTE — Plan of Care (Signed)

## 2023-02-26 NOTE — Progress Notes (Signed)
SLP Cancellation Note  Patient Details Name: Dennis [name] MRN: [id number] DOB: [birth year]   Cancelled treatment:       Reason Eval/Treat Not Completed: Patient at procedure or test/unavailable; pt in fluoro when SLP attempted session; ST will continue efforts.   Pat Miley Blanchett,M.S.,CCC-SLP 02/26/2023, 2:28 PM

## 2023-02-26 NOTE — Progress Notes (Signed)
Physical Therapy Treatment Patient Details Name: Dennis [name] MRN: [id number] DOB: 1945-08-14 Today's Date: 02/26/2023   History of Present Illness 77 yo male presenting to ED 10/5 with AMS. CTH and MRI brain negative; undergoing long-term EEG and lumbar puncture pending. PMH includes HLD, BPH, HTN, rheumatoid arthritis, COPD, Barrett's esophagus, OSA, prior CVA,  dysarthria, LLE weakness, dementia.    PT Comments  Patient moderately easy to arouse, and once seated EOB remained very alert for remainder of session. Incr assist with bed mobility in part due to decr cognition. Able to stand at EOB with up to mod assist for balance (posterior lean) and side-step with +2 mod assist. Unable to progress to ambulation this date.     If plan is discharge home, recommend the following: Assistance with cooking/housework;Assistance with feeding;Direct supervision/assist for medications management;Direct supervision/assist for financial management;Assist for transportation;Help with stairs or ramp for entrance;Supervision due to cognitive status;Two people to help with walking and/or transfers   Can travel by private vehicle     Yes  Equipment Recommendations  Other (comment) (TBA--pt unable to report what equipment he has (if any))    Recommendations for Other Services       Precautions / Restrictions Precautions Precautions: Fall Precaution Comments: 4 pt restraints Restrictions Weight Bearing Restrictions: No     Mobility  Bed Mobility Overal bed mobility: Needs Assistance Bed Mobility: Supine to Sit, Sit to Supine     Supine to sit: Min assist, HOB elevated Sit to supine: Mod assist   General bed mobility comments: due to poor cognition, needed maximal multimodal cues    Transfers Overall transfer level: Needs assistance Equipment used: 2 person hand held assist Transfers: Sit to/from Stand Sit to Stand: Min assist (x 2 reps)           General transfer comment: mod A to  take lateral steps to [name]    Ambulation/Gait               General Gait Details: Unable due to multiple lines/tubes including EEG and requires 2 person hands-on assist   Stairs             Wheelchair Mobility     Tilt Bed    Modified Rankin (Stroke Patients Only)       Balance Overall balance assessment: Needs assistance Sitting-balance support: Feet supported Sitting balance-Leahy Scale: Fair   Postural control: Posterior lean Standing balance support: Single extremity supported, During functional activity Standing balance-Leahy Scale: Poor Standing balance comment: unable to sustain static balance at EOB; stood for up to 45 seconds and returned to sitting due to fatigue                            Cognition Arousal: Lethargic Behavior During Therapy: WFL for tasks assessed/performed Overall Cognitive Status: Impaired/Different from baseline Area of Impairment: Orientation, Attention, Memory, Following commands, Safety/judgement, Awareness, Problem solving                 Orientation Level: Disoriented to, Place, Time, Situation Current Attention Level: Sustained Memory: Decreased recall of precautions, Decreased short-term memory Following Commands: Follows one step commands with increased time, Follows multi-step commands consistently Safety/Judgement: Decreased awareness of safety, Decreased awareness of deficits Awareness: Intellectual Problem Solving: Slow processing, Decreased initiation, Difficulty sequencing, Requires verbal cues, Requires tactile cues General Comments: able to state first name and month/date of birth; incoherent speech throughout. requires multimodal cues to assist with any task.  Exercises      General Comments General comments (skin integrity, edema, etc.): Once sitting EOB, remained very alert for standing and side-stepping      Pertinent Vitals/Pain Pain Assessment Pain Assessment: Faces Faces  Pain Scale: No hurt    Home Living                          Prior Function            PT Goals (current goals can now be found in the care plan section) Acute Rehab PT Goals Patient Stated Goal: unable to state Time For Goal Achievement: 03/10/23 Potential to Achieve Goals: Fair Progress towards PT goals: Progressing toward goals    Frequency    Min 3X/week      PT Plan      Co-evaluation              AM-PAC PT "6 Clicks" Mobility   Outcome Measure  Help needed turning from your back to your side while in a flat bed without using bedrails?: None Help needed moving from lying on your back to sitting on the side of a flat bed without using bedrails?: A Lot Help needed moving to and from a bed to a chair (including a wheelchair)?: A Little Help needed standing up from a chair using your arms (e.g., wheelchair or bedside chair)?: A Little Help needed to walk in hospital room?: Total Help needed climbing 3-5 steps with a railing? : Total 6 Click Score: 14    End of Session   Activity Tolerance: Treatment limited secondary to medical complications (Comment) (confusion; multiple lines, EEG) Patient left: in bed;with call bell/phone within reach;with bed alarm set;with restraints reapplied (bil wrist and ankle)   PT Visit Diagnosis: Unsteadiness on feet (R26.81);Other abnormalities of gait and mobility (R26.89);History of falling (Z91.81);Muscle weakness (generalized) (M62.81)     Time: 0981-1914 PT Time Calculation (min) (ACUTE ONLY): 20 min  Charges:    $Therapeutic Activity: 8-22 mins PT General Charges $$ ACUTE PT VISIT: 1 Visit                      Jerolyn Center, PT Acute Rehabilitation Services  Office [phone]    Zena Amos 02/26/2023, 11:35 AM

## 2023-02-26 NOTE — Progress Notes (Signed)
Tele called that patient had 15 beat VT. Pt is comfortably lying in bed with intermittent agitation. VS: BP: 117/55(71), HR: 71, RR: 18, O2 sat: 92% J. Howerter, DO made aware.

## 2023-02-26 NOTE — Procedures (Signed)
PROCEDURE SUMMARY:  Successful lumbar puncture at L2-L3 yielding 5 mL clear, colorless CSF.   No immediate complications.  Pt tolerated fair despite Ativan given prior to procedure by unit staff.  Patient restless, not following commands.    Specimen was sent for labs.  EBL < 5mL  Hoyt Koch PA-C 02/26/2023 2:18 PM

## 2023-02-26 NOTE — Progress Notes (Signed)
Neurology Progress Note  Brief HPI: 77 year old patient with history of arthritis, hypertension, hyperlipidemia, BPH, rheumatoid arthritis, COPD, sleep apnea and stroke presented with altered mentation.  He was noted to be quite confused in the ED and required one-to-one sitter for some time.  He denies headache but does endorse some neck pain on rotation and palpation.  He is afebrile and has no leukocytosis, there is some suspicion for viral or rickettsial meningitis given that patient spends a lot of time outdoors.  Subjective: Patient has been hemodynamically stable overnight and is seen in his room without family at the bedside.  He is more disoriented today than yesterday and is unable to state his name or location. IR Lumbar puncture completed today.   Exam: Vitals:   02/26/23 0542 02/26/23 0800  BP: (!) 117/55   Pulse: 71   Resp: 18   Temp:  97.7 F (36.5 C)  SpO2: 92%    Gen: In bed, NAD Resp: non-labored breathing, no acute distress Abd: soft, nt  Neuro: Mental Status: Responds to voice, but is unable to state his name or place, will occasionally follow simple commands but not consistently. States "yes" when asked if he is doing okay Cranial Nerves: Pupils equal round and reactive to light, extraocular movements intact, face symmetrical, phonation normal, hearing intact to voice, tongue midline Motor: Able to move all 4 extremities with good antigravity strength Sensory: Intact to light touch throughout Gait: Deferred  Pertinent Labs:    Latest Ref Rng & Units 02/26/2023    5:54 AM 02/25/2023    3:24 AM 02/24/2023    7:37 AM  CBC  WBC 4.0 - 10.5 K/uL 8.6  10.0  9.3   Hemoglobin 13.0 - 17.0 g/dL 10.2  72.5  36.6   Hematocrit 39.0 - 52.0 % 35.1  40.2  41.3   Platelets 150 - 400 K/uL 224  239  215        Latest Ref Rng & Units 02/26/2023    5:54 AM 02/25/2023    3:24 AM 02/24/2023    7:37 AM  BMP  Glucose 70 - 99 mg/dL 87  94  98   BUN 8 - 23 mg/dL 10  11  10     Creatinine 0.61 - 1.24 mg/dL 4.40  3.47  4.25   Sodium 135 - 145 mmol/L 140  137  135   Potassium 3.5 - 5.1 mmol/L 3.7  3.5  3.6   Chloride 98 - 111 mmol/L 107  105  105   CO2 22 - 32 mmol/L 24  22  23    Calcium 8.9 - 10.3 mg/dL 8.6  9.0  9.4    Lab Results  Component Value Date   GLUCCSF 56 02/24/2023   PROTEINCSF 48 (H) 02/24/2023   Lab Results  Component Value Date   VITAMINB12 611 02/23/2023   CRP 4.3 (H) 02/26/2023   FOLATE 8.4 02/23/2023    Meningitis/encephalitis panel negative  Spotted fever group antibodies pending  Imaging Reviewed:  CT head: No acute abnormality  MRI brain: No acute abnormality, moderate generalized atrophy and chronic white matter disease, remote lacunar infarct of right thalamus and bilateral mastoid effusions  EEG: Generalized irregular slow activity consistent with generalized nonspecific encephalopathy with no seizures or epileptiform discharges  LTM EEG- 02/25/2023 1353 to 02/26/2023 0900  ABNORMALITY: - Continuous slow, generalized IMPRESSION: 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 No seizures or epileptiform discharges were seen throughout the recording.  Assessment: 77 year old patient with history of  arthritis, hypertension, hyperlipidemia, BPH, rheumatoid arthritis, COPD, sleep apnea and stroke presented originally with altered mentation.  On exam, he was noted to have neck pain with palpation and head rotation as well as confusion.  He does not have a fever or leukocytosis, suspicion is high for viral or rickettsial meningitis given the patient spends a lot of time outdoors.  Patient will need lumbar puncture to evaluate for this.  Unfortunately, patient's wife's phone is broken and we cannot reach her for consent, so we will need to proceed with two-physician consent for procedure.  However, given that patient is unable to consistently follow commands and is not redirectable today, will need to perform LP under sedation  and fluoroscopy.  patient is already on appropriate antibiotics and antivirals for meningitis coverage.  EEG was negative for seizure activity, and imaging of the brain reveals no acute abnormality.  Continue LTM.   Impression: Altered mental status with differential to include viral or rickettsial meningitis, toxic metabolic encephalopathy, seizure activity and delirium  Recommendations: -Lumbar puncture completed, CSF studies pending  -Continue meningitis coverage with ampicillin, ceftriaxone, vancomycin and acyclovir -Continue LTM   Patient seen and examined by NP/APP with MD. MD to update note as needed.   Elmer Picker, DNP, FNP-BC [facility] Pager: [phone]  NEUROHOSPITALIST ADDENDUM Performed a face to face diagnostic evaluation.   I have reviewed the contents of history and physical exam as documented by PA/ARNP/Resident and agree with above documentation.  I have discussed and formulated the above plan as documented. Edits to the note have been made as needed.  Impression/Key exam findings/Plan: LP under fluoro is done but the priliminary CSF studies are not back. Continue empiric coverage for meningitis for now.  Erick Blinks, MD [facility] [phone]   If 7pm to 7am, please call on call as listed on AMION.

## 2023-02-26 NOTE — Procedures (Addendum)
Patient Name: Dennis [name]  MRN: [id number]  Epilepsy Attending: Charlsie Quest  Referring Physician/Provider: Marjorie Smolder, NP  Duration: 02/25/2023 1353 to 02/26/2023 0900  Patient history: 77yo M with ams getting eeg to evaluate for seizure  Level of alertness: lethargic , asleep  AEDs during EEG study: None  Technical aspects: This EEG study was done with scalp electrodes positioned according to the 10-20 International system of electrode placement. Electrical activity was reviewed with band pass filter of 1-70Hz , sensitivity of 7 uV/mm, display speed of 59mm/sec with a 60Hz  notched filter applied as appropriate. EEG data were recorded continuously and digitally stored.  Video monitoring was available and reviewed as appropriate.  Description: No clear posterior dominant rhythm was seen.  Sleep was characterized by sleep symptoms (12 to 14 Hz), maximal frontocentral region.  EEG showed continuous generalized 3 to 5 Hz theta-delta slowing. Hyperventilation and photic stimulation were not performed.     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 No seizures or epileptiform discharges were seen throughout the recording.  [name] Annabelle Harman

## 2023-02-26 NOTE — Progress Notes (Signed)
PROGRESS NOTE                                                                                                                                                                                                             Patient Demographics:    Dennis [name], is a 77 y.o. male, DOB - December 16, 1945, UJW:[id number]  Outpatient Primary MD for the patient is Pcp, No    LOS - 4  Admit date - 02/22/2023    Chief Complaint  Patient presents with   Altered Mental Status       Brief Narrative (HPI from H&P)   77 year old male with past medical history of HLD, BPH, HTN, rheumatoid arthritis, COPD, Barrett's esophagus, OSA does not use CPAP, history of CVA. Patient sent in for altered mentation. At baseline patient has a ataxic gait but is not confused. Over the course of the past week he has become increasingly confused. He was brought to the ER.  In the ER he was trying to escape from the ER room had to be IVC need placed with 1 is to 1 sitter, had 2 CT scans of the head which were unremarkable, he was admitted for further workup of encephalopathy.   Subjective:   Patient in bed, awake, still confused, denies headache.   Assessment  & Plan :   Gradually progressive metabolic encephalopathy over the course of 5 to 7 days.  Brought in by wife for worsening confusion, confusion persisted in the ER, CT head x 2 several hours apart is nonacute, no focal deficits apparent, unable to provide any history, wife called twice on 02/23/2023 but phone switched off.  He is currently afebrile, no leukocytosis, CRP and ESR are stable, head CT x 2 stable, ABG stable, RPR stable, ammonia stable, stable B12, TSH, A1c, MRI and EEG, he may require LP to rule out atypical meningitis, bacterial meningitis highly unlikely based on presentation, patient does go out hunting and I think is at risk for possible rickettsial meningitis versus viral  meningitis/encephalitis.  He had been placed on Rocephin morning of 02/23/2023, seen by neurology and antibiotics have been broadened, case discussed with neurologist in detail morning of 02/23/2023 and again on 02/24/2023, bedside LP by neurology failed, IR to do under fluoroscopy on 02/26/2023.  Continue supportive care with as needed Haldol.   Dehydration, reduced urine  output, AKI.  Poor oral intake likely due to confusion as above, hold ARB, hydrate with IV fluids.  Bladder scan has 53 cc of urine.  History of rheumatoid arthritis.  Post discharge resume methotrexate and sulfasalazine.  Stable CRP ESR.  BPH.  Proscar and Flomax if he can tolerate oral medications.  Hypertension.  Continue Coreg, ARB held due to AKI, as needed hydralazine.  Dyslipidemia.  Check a.m. lipid panel.  Hypokalemia.  Replaced.  Nonspecific EKG changes.  Denies any chest pain.  Coreg and statin, noted with EF slightly depressed and some wall motion abnormalities, have to hold antiplatelets due to pending LP.  Troponin trend was stable and in non-ACS pattern, once mentation improves cardiology input.       Condition - Extremely Guarded  Family Communication  : Called wife [name] [phone] twice on 02/23/2023 at 8 AM and 10 AM, no response, message left on answering machine on her cell phone.  Wife informs me that her cell phone is broken, discussed with her in detail in room on 02/23/2023.  [name],[name] Biomedical engineer) [phone] (Mobile) was updated message left morning of 02/24/2023 at 10:04 AM, called again afternoon of 02/24/2023, call 02/25/2023 at 10:22 AM message left  Called on 02/25/2023 (336) [phone]  at 11 AM over 20 rings, no response   Code Status :  Full  Consults  :  Neuro  PUD Prophylaxis :  PPI   Procedures  :     LP -   Echocardiogram.  1. Left ventricular ejection fraction, by estimation, is 45 to 50%. Left ventricular ejection fraction by 2D MOD biplane is 49.5 %. The left ventricle has  mildly decreased function. The left ventricle demonstrates regional wall motion abnormalities (see  scoring diagram/findings for description). There is mild left ventricular hypertrophy. Left ventricular diastolic function could not be evaluated.  2. Right ventricular systolic function is normal. The right ventricular size is normal.  3. The mitral valve is normal in structure. No evidence of mitral valve regurgitation. No evidence of mitral stenosis.  4. The aortic valve is normal in structure. There is moderate calcification of the aortic valve. There is moderate thickening of the aortic valve. Aortic valve regurgitation is trivial. No aortic stenosis is present.  5. The inferior vena cava is normal in size with greater than 50% respiratory variability, suggesting right atrial pressure of 3 mmHg.   CT head x 2.  Nonacute.    MRI brain.  1. No acute intracranial abnormality. 2. Moderate generalized atrophy and white matter disease likely reflects the sequela of chronic microvascular ischemia. 3. Remote lacunar infarct of the right thalamus. 4. Bilateral mastoid effusions. No obstructing nasopharyngeal lesion is present  EEG.  No active seizures      Disposition Plan  :    Status is: Inpatient  DVT Prophylaxis  :    Place and maintain sequential compression device Start: 02/23/23 1015    Lab Results  Component Value Date   PLT 224 02/26/2023    Diet :  Diet Order             DIET DYS 3 Room service appropriate? Yes; Fluid consistency: Thin  Diet effective now                    Inpatient Medications  Scheduled Meds:  atorvastatin  40 mg Oral Daily   carvedilol  3.125 mg Oral BID WC   finasteride  5 mg Oral Daily   folic acid  1  mg Oral Daily   pantoprazole  40 mg Oral Daily   tamsulosin  0.4 mg Oral QPC supper   Continuous Infusions:  acyclovir 680 mg (02/25/23 2349)   ampicillin (OMNIPEN) IV 2 g (02/26/23 0513)   cefTRIAXone (ROCEPHIN)  IV 2 g (02/25/23 2146)    lactated ringers 50 mL/hr at 02/26/23 0511   vancomycin 1,000 mg (02/25/23 2141)   PRN Meds:.acetaminophen **OR** acetaminophen, haloperidol lactate, hydrALAZINE, LORazepam, ondansetron **OR** ondansetron (ZOFRAN) IV, senna-docusate     Objective:   Vitals:   02/25/23 2339 02/26/23 0356 02/26/23 0542 02/26/23 0800  BP: (!) 158/81 132/75 (!) 117/55   Pulse: 85 93 71   Resp: 15 18 18    Temp: (!) 97.2 F (36.2 C) (!) 97.5 F (36.4 C)  97.7 F (36.5 C)  TempSrc: Axillary Axillary  Oral  SpO2: 91% 91% 92%   Weight:      Height:        Wt Readings from Last 3 Encounters:  02/22/23 68 kg  02/21/23 68 kg  12/25/22 68 kg    No intake or output data in the 24 hours ending 02/26/23 0901    Physical Exam  Awake mildly confused but better than yesterday, supple neck, No new F.N deficits,   [place].AT,PERRAL Supple Neck, No JVD,   Symmetrical Chest wall movement, Good air movement bilaterally, CTAB RRR,No Gallops,Rubs or new Murmurs,  +ve B.Sounds, Abd Soft, No tenderness,   No Cyanosis, Clubbing or edema        Data Review:    Recent Labs  Lab 02/23/23 0038 02/23/23 0739 02/24/23 0737 02/25/23 0324 02/26/23 0554  WBC 9.3 9.0 9.3 10.0 8.6  HGB 13.6 13.0 13.3 13.3 11.7*  HCT 42.7 40.6 41.3 40.2 35.1*  PLT 236 228 215 239 224  MCV 89.9 93.3 91.2 90.3 89.3  MCH 28.6 29.9 29.4 29.9 29.8  MCHC 31.9 32.0 32.2 33.1 33.3  RDW 13.9 14.0 14.0 14.0 14.1  LYMPHSABS 1.5 2.0 1.7 1.9 1.9  MONOABS 0.6 1.2* 0.8 1.0 0.9  EOSABS 0.0 0.0 0.1 0.1 0.1  BASOSABS 0.0 0.1 0.0 0.1 0.1    Recent Labs  Lab 02/21/23 1254 02/21/23 1304 02/22/23 1332 02/22/23 2128 02/23/23 0038 02/23/23 0452 02/23/23 1053 02/23/23 1059 02/24/23 0737 02/25/23 0324 02/26/23 0340 02/26/23 0554  NA 138   < > 139  --  140  --   --   --  135 137  --  140  K 4.3   < > 4.0  --  3.4*  --   --   --  3.6 3.5  --  3.7  CL 102   < > 102  --  103  --   --   --  105 105  --  107  CO2 30  --  26  --  25  --   --    --  23 22  --  24  ANIONGAP 6  --  11  --  12  --   --   --  7 10  --  9  GLUCOSE 101*   < > 94  --  122*  --   --   --  98 94  --  87  BUN 15   < > 13  --  14  --   --   --  10 11  --  10  CREATININE 1.17   < > 1.19 1.23 1.33*  --   --   --  1.21 1.10  --  1.24  AST 21  --  20  --   --   --   --   --  30 34  --  32  ALT 17  --  15  --   --   --   --   --  16 17  --  14  ALKPHOS 34*  --  34*  --   --   --   --   --  29* 33*  --  27*  BILITOT 0.6  --  0.3  --   --   --   --   --  0.6 0.8  --  0.4  ALBUMIN 3.5  --  3.5  --   --   --   --   --  2.9* 3.0*  --  2.5*  CRP  --   --   --   --  0.5  --   --   --  0.8 2.3* 4.3*  --   PROCALCITON  --   --   --   --   --   --  <0.10  --  <0.10 <0.10  --   --   INR 1.0  --   --   --   --   --   --   --   --   --   --   --   TSH  --   --   --  3.180  --   --   --   --   --   --   --   --   HGBA1C  --   --   --   --   --   --   --  5.5  --   --   --   --   AMMONIA  --   --   --   --   --  12  --   --   --   --   --   --   BNP  --   --   --   --   --   --   --   --  408.6* 460.0*  --  965.2*  MG  --   --   --   --   --   --   --   --  1.7 1.9  --  1.9  CALCIUM 11.6*  --  11.6*  --  10.8*  --   --   --  9.4 9.0  --  8.6*   < > = values in this interval not displayed.      Recent Labs  Lab 02/21/23 1254 02/22/23 1332 02/22/23 2128 02/23/23 0038 02/23/23 0452 02/23/23 1053 02/23/23 1059 02/24/23 0737 02/25/23 0324 02/26/23 0340 02/26/23 0554  CRP  --   --   --  0.5  --   --   --  0.8 2.3* 4.3*  --   PROCALCITON  --   --   --   --   --  <0.10  --  <0.10 <0.10  --   --   INR 1.0  --   --   --   --   --   --   --   --   --   --   TSH  --   --  3.180  --   --   --   --   --   --   --   --  HGBA1C  --   --   --   --   --   --  5.5  --   --   --   --   AMMONIA  --   --   --   --  12  --   --   --   --   --   --   BNP  --   --   --   --   --   --   --  408.6* 460.0*  --  965.2*  MG  --   --   --   --   --   --   --  1.7 1.9  --  1.9  CALCIUM  11.6* 11.6*  --  10.8*  --   --   --  9.4 9.0  --  8.6*    --------------------------------------------------------------------------------------------------------------- Lab Results  Component Value Date   CHOL 152 02/24/2023   HDL 28 (L) 02/24/2023   LDLCALC 105 (H) 02/24/2023   TRIG 94 02/24/2023   CHOLHDL 5.4 02/24/2023    Lab Results  Component Value Date   HGBA1C 5.5 02/23/2023   No results for input(s): "TSH", "T4TOTAL", "FREET4", "T3FREE", "THYROIDAB" in the last 72 hours.  Recent Labs    02/23/23 1053 02/23/23 1107  VITAMINB12 611  --   FOLATE  --  8.4   ------------------------------------------------------------------------------------------------------------------ Cardiac Enzymes No results for input(s): "CKMB", "TROPONINI", "MYOGLOBIN" in the last 168 hours.  Invalid input(s): "CK"  Micro Results Recent Results (from the past 240 hour(s))  MRSA Next Gen by PCR, Nasal     Status: None   Collection Time: 02/23/23  3:31 AM   Specimen: Nasal Mucosa; Nasal Swab  Result Value Ref Range Status   MRSA by PCR Next Gen NOT DETECTED NOT DETECTED Final    Comment: (NOTE) The GeneXpert MRSA Assay (FDA approved for NASAL specimens only), is one component of a comprehensive MRSA colonization surveillance program. It is not intended to diagnose MRSA infection nor to guide or monitor treatment for MRSA infections. Test performance is not FDA approved in patients less than 74 years old. Performed at [facility], [address], [place], [place] [address]     Radiology Reports ECHOCARDIOGRAM COMPLETE  Result Date: 02/24/2023    ECHOCARDIOGRAM REPORT   Patient Name:   Dennis [name] Date of Exam: 02/24/2023 Medical Rec #:  [id number]     Height:       64.0 in Accession #:    [id number]    Weight:       149.9 lb Date of Birth:  1945-12-13     BSA:          1.731 m Patient Age:    77 years      BP:           118/94 mmHg Patient Gender: M             HR:           88  bpm. Exam Location:  Inpatient Procedure: 2D Echo, Color Doppler and Cardiac Doppler Indications:    Abnormal ECG  History:        Patient has no prior history of Echocardiogram examinations.                 Signs/Symptoms:Altered Mental Status; Risk Factors:Hypertension.  Sonographer:    Milda Smart Referring Phys: [facility]  Sonographer Comments: Image acquisition challenging due to patient body  habitus. Pt scanned supine due to restraints. IMPRESSIONS  1. Left ventricular ejection fraction, by estimation, is 45 to 50%. Left ventricular ejection fraction by 2D MOD biplane is 49.5 %. The left ventricle has mildly decreased function. The left ventricle demonstrates regional wall motion abnormalities (see  scoring diagram/findings for description). There is mild left ventricular hypertrophy. Left ventricular diastolic function could not be evaluated.  2. Right ventricular systolic function is normal. The right ventricular size is normal.  3. The mitral valve is normal in structure. No evidence of mitral valve regurgitation. No evidence of mitral stenosis.  4. The aortic valve is normal in structure. There is moderate calcification of the aortic valve. There is moderate thickening of the aortic valve. Aortic valve regurgitation is trivial. No aortic stenosis is present.  5. The inferior vena cava is normal in size with greater than 50% respiratory variability, suggesting right atrial pressure of 3 mmHg. FINDINGS  Left Ventricle: Left ventricular ejection fraction, by estimation, is 45 to 50%. Left ventricular ejection fraction by 2D MOD biplane is 49.5 %. The left ventricle has mildly decreased function. The left ventricle demonstrates regional wall motion abnormalities. The left ventricular internal cavity size was normal in size. There is mild left ventricular hypertrophy. Left ventricular diastolic function could not be evaluated.  LV Wall Scoring: The entire anterior wall and entire septum are  hypokinetic. The entire lateral wall, entire inferior wall, and apex are normal. Right Ventricle: The right ventricular size is normal. No increase in right ventricular wall thickness. Right ventricular systolic function is normal. Left [facility]: Left atrial size was normal in size. Right [facility]: Right atrial size was normal in size. Pericardium: There is no evidence of pericardial effusion. Mitral Valve: The mitral valve is normal in structure. No evidence of mitral valve regurgitation. No evidence of mitral valve stenosis. Tricuspid Valve: The tricuspid valve is normal in structure. Tricuspid valve regurgitation is not demonstrated. No evidence of tricuspid stenosis. Aortic Valve: The aortic valve is normal in structure. There is moderate calcification of the aortic valve. There is moderate thickening of the aortic valve. Aortic valve regurgitation is trivial. No aortic stenosis is present. Pulmonic Valve: The pulmonic valve was normal in structure. Pulmonic valve regurgitation is not visualized. No evidence of pulmonic stenosis. Aorta: The aortic root is normal in size and structure. Venous: The inferior vena cava is normal in size with greater than 50% respiratory variability, suggesting right atrial pressure of 3 mmHg. IAS/Shunts: No atrial level shunt detected by color flow Doppler.  LEFT VENTRICLE PLAX 2D                        Biplane EF (MOD) LVIDd:         4.10 cm         LV Biplane EF:   Left LVIDs:         3.20 cm                          ventricular LV PW:         1.00 cm                          ejection LV IVS:        1.20 cm                          fraction  by LVOT diam:     2.40 cm                          2D MOD LVOT Area:     4.52 cm                         biplane is                                                 49.5 %.  LV Volumes (MOD)               Diastology LV vol d, MOD    39.7 ml       LV e' medial:  5.66 cm/s A2C:                           LV e' lateral: 6.53 cm/s LV vol d, MOD    51.4  ml A4C: LV vol s, MOD    22.3 ml A2C: LV vol s, MOD    21.5 ml A4C: LV SV MOD A2C:   17.4 ml LV SV MOD A4C:   51.4 ml LV SV MOD BP:    22.7 ml RIGHT VENTRICLE RV S prime:     14.90 cm/s TAPSE (M-mode): 1.8 cm LEFT ATRIUM           Index        RIGHT ATRIUM          Index LA diam:      4.30 cm 2.48 cm/m   RA Area:     6.91 cm LA Vol (A2C): 20.7 ml 11.96 ml/m  RA Volume:   10.10 ml 5.84 ml/m LA Vol (A4C): 26.2 ml 15.14 ml/m   AORTA Ao Root diam: 3.50 cm  SHUNTS Systemic Diam: 2.40 cm Chilton Si MD Electronically signed by Chilton Si MD Signature Date/Time: 02/24/2023/4:45:53 PM    Final    MR BRAIN W WO CONTRAST  Result Date: 02/23/2023 CLINICAL DATA:  Delirium. EXAM: MRI HEAD WITHOUT AND WITH CONTRAST TECHNIQUE: Multiplanar, multiecho pulse sequences of the brain and surrounding structures were obtained without and with intravenous contrast. CONTRAST:  7mL GADAVIST GADOBUTROL 1 MMOL/ML IV SOLN COMPARISON:  CT head without contrast 02/22/2023. FINDINGS: Brain: No acute infarct, hemorrhage, or mass lesion is present. Moderate generalized atrophy and diffuse periventricular white matter changes are present bilaterally. Dilated perivascular spaces are present within the basal ganglia. Remote lacunar infarct is present in the right thalamus. The brainstem and cerebellum are within normal limits. The ventricles are proportionate to the degree of atrophy. No significant extraaxial fluid collection is present. Midline structures are within normal limits. Postcontrast images demonstrate no pathologic enhancement. Vascular: Flow is present in the major intracranial arteries. Skull and upper cervical spine: The craniocervical junction is normal. Upper cervical spine is within normal limits. Marrow signal is unremarkable. Sinuses/Orbits: Bilateral mastoid effusions are present. No obstructing nasopharyngeal lesion is present. A polyp or mucous retention cyst is present within an atelectatic right maxillary  sinus. Paranasal sinuses are otherwise clear. The globes and orbits are within normal limits. IMPRESSION: 1. No acute intracranial abnormality. 2. Moderate generalized atrophy and white matter disease likely reflects the sequela of chronic microvascular ischemia. 3. Remote lacunar infarct  of the right thalamus. 4. Bilateral mastoid effusions. No obstructing nasopharyngeal lesion is present. Electronically Signed   By: Marin Roberts M.D.   On: 02/23/2023 17:20   CT Head Wo Contrast  Result Date: 02/22/2023 CLINICAL DATA:  Altered mental status EXAM: CT HEAD WITHOUT CONTRAST TECHNIQUE: Contiguous axial images were obtained from the base of the skull through the vertex without intravenous contrast. RADIATION DOSE REDUCTION: This exam was performed according to the departmental dose-optimization program which includes automated exposure control, adjustment of the mA and/or kV according to patient size and/or use of iterative reconstruction technique. COMPARISON:  CT 12/25/2022, 02/21/2023 FINDINGS: Brain: No acute territorial infarction, hemorrhage or intracranial mass. Atrophy and advanced chronic small vessel ischemic changes of the white matter. More focal right posterior frontal hypodensity, series 2, image 21, also chronic. Age indeterminate lacunar infarct in the head of left caudate, suspect chronic. Ventricles are stable in size. Vascular: No hyperdense vessels. Vertebral and carotid vascular calcification Skull: Normal. Negative for fracture or focal lesion. Sinuses/Orbits: No acute finding. Other: None IMPRESSION: 1. No CT evidence for acute intracranial abnormality. 2. Atrophy and advanced chronic small vessel ischemic changes of the white matter. Age indeterminate lacunar infarct within the left caudate head. Electronically Signed   By: Jasmine Pang M.D.   On: 02/22/2023 18:01      Signature  -   Susa Raring M.D on 02/26/2023 at 9:01 AM   -  To page go to [email or web address]

## 2023-02-26 NOTE — Progress Notes (Signed)
LTM maint complete - no skin breakdown seen. Atrium monitored, Event button test confirmed by [facility].

## 2023-02-26 NOTE — Progress Notes (Signed)
Pharmacy Antibiotic Note  Dennis [name] is a 77 y.o. male admitted on 02/22/2023 with  rule out meningitis .  Pharmacy has been consulted for Vancomycin, Ceftriaxone, Ampicillin, Acyclovir dosing. WBC ok. Scr 1.24.   Planning for LP by IR today. Cont current regimen for now.   Plan: Vancomycin 1000 mg IV q24h >>>Traditional dosing in setting of rule out meningitis Ceftriaxone to 2g IV q12h Ampicillin 2g IV q6h Acyclovir 10 mg/kg IV q12h F/U need for IV fluids once current fluid order expires     Height: 5\' 4"  (162.6 cm) Weight: 68 kg (149 lb 14.6 oz) IBW/kg (Calculated) : 59.2  Temp (24hrs), Avg:97.6 F (36.4 C), Min:97.1 F (36.2 C), Max:98.4 F (36.9 C)  Recent Labs  Lab 02/22/23 2128 02/23/23 0038 02/23/23 0739 02/24/23 0737 02/25/23 0324 02/26/23 0554  WBC 9.2 9.3 9.0 9.3 10.0 8.6  CREATININE 1.23 1.33*  --  1.21 1.10 1.24    Estimated Creatinine Clearance: 41.1 mL/min (by C-G formula based on SCr of 1.24 mg/dL).    Allergies  Allergen Reactions   Codeine    Shellfish Allergy     CTX 10/6 > 10/7 acyclovir>> 10/7 amp>> 10/7 vanc>>   MRSA neg  Ulyses Southward, PharmD, BCIDP, AAHIVP, CPP Infectious Disease Pharmacist 02/26/2023 1:59 PM

## 2023-02-26 NOTE — Plan of Care (Signed)
  Problem: Safety: Goal: Non-violent Restraint(s) Outcome: Progressing   Problem: Education: Goal: Knowledge of General Education information will improve Description: Including pain rating scale, medication(s)/side effects and non-pharmacologic comfort measures Outcome: Progressing   Problem: Health Behavior/Discharge Planning: Goal: Ability to manage health-related needs will improve Outcome: Progressing   Problem: Clinical Measurements: Goal: Ability to maintain clinical measurements within normal limits will improve Outcome: Progressing Goal: Will remain free from infection Outcome: Progressing   Problem: Activity: Goal: Risk for activity intolerance will decrease Outcome: Progressing   Problem: Nutrition: Goal: Adequate nutrition will be maintained Outcome: Progressing   Problem: Coping: Goal: Level of anxiety will decrease Outcome: Progressing   Problem: Elimination: Goal: Will not experience complications related to bowel motility Outcome: Progressing   Problem: Safety: Goal: Ability to remain free from injury will improve Outcome: Progressing   Problem: Skin Integrity: Goal: Risk for impaired skin integrity will decrease Outcome: Progressing

## 2023-02-27 ENCOUNTER — Inpatient Hospital Stay (HOSPITAL_COMMUNITY): Payer: 59

## 2023-02-27 DIAGNOSIS — R410 Disorientation, unspecified: Secondary | ICD-10-CM | POA: Diagnosis not present

## 2023-02-27 LAB — COMPREHENSIVE METABOLIC PANEL
ALT: 15 U/L (ref 0–44)
AST: 22 U/L (ref 15–41)
Albumin: 2.3 g/dL — ABNORMAL LOW (ref 3.5–5.0)
Alkaline Phosphatase: 27 U/L — ABNORMAL LOW (ref 38–126)
Anion gap: 8 (ref 5–15)
BUN: 11 mg/dL (ref 8–23)
CO2: 24 mmol/L (ref 22–32)
Calcium: 8.4 mg/dL — ABNORMAL LOW (ref 8.9–10.3)
Chloride: 107 mmol/L (ref 98–111)
Creatinine, Ser: 0.97 mg/dL (ref 0.61–1.24)
GFR, Estimated: >60 mL/min (ref >60)
Glucose, Bld: 83 mg/dL (ref 70–99)
Potassium: 3.5 mmol/L (ref 3.5–5.1)
Sodium: 139 mmol/L (ref 135–145)
Total Bilirubin: 0.8 mg/dL (ref 0.3–1.2)
Total Protein: 5.2 g/dL — ABNORMAL LOW (ref 6.5–8.1)

## 2023-02-27 LAB — CBC WITH DIFFERENTIAL/PLATELET
Abs Immature Granulocytes: 0.04 10*3/uL (ref 0.00–0.07)
Basophils Absolute: 0 10*3/uL (ref 0.0–0.1)
Basophils Relative: 1 %
Eosinophils Absolute: 0.2 10*3/uL (ref 0.0–0.5)
Eosinophils Relative: 2 %
HCT: 34.8 % — ABNORMAL LOW (ref 39.0–52.0)
Hemoglobin: 11 g/dL — ABNORMAL LOW (ref 13.0–17.0)
Immature Granulocytes: 1 %
Lymphocytes Relative: 17 %
Lymphs Abs: 1.4 10*3/uL (ref 0.7–4.0)
MCH: 28.3 pg (ref 26.0–34.0)
MCHC: 31.6 g/dL (ref 30.0–36.0)
MCV: 89.5 fL (ref 80.0–100.0)
Monocytes Absolute: 0.8 10*3/uL (ref 0.1–1.0)
Monocytes Relative: 10 %
Neutro Abs: 5.6 10*3/uL (ref 1.7–7.7)
Neutrophils Relative %: 69 %
Platelets: 211 10*3/uL (ref 150–400)
RBC: 3.89 MIL/uL — ABNORMAL LOW (ref 4.22–5.81)
RDW: 14.1 % (ref 11.5–15.5)
WBC: 8 10*3/uL (ref 4.0–10.5)
nRBC: 0 % (ref 0.0–0.2)

## 2023-02-27 LAB — BRAIN NATRIURETIC PEPTIDE: B Natriuretic Peptide: 470.3 pg/mL — ABNORMAL HIGH (ref 0.0–100.0)

## 2023-02-27 LAB — C-REACTIVE PROTEIN: CRP: 3.3 mg/dL — ABNORMAL HIGH (ref <1.0)

## 2023-02-27 LAB — PROCALCITONIN: Procalcitonin: <0.1 ng/mL

## 2023-02-27 LAB — MAGNESIUM: Magnesium: 1.9 mg/dL (ref 1.7–2.4)

## 2023-02-27 LAB — PATHOLOGIST SMEAR REVIEW

## 2023-02-27 NOTE — Plan of Care (Signed)
  Problem: Safety: Goal: Non-violent Restraint(s) Outcome: Progressing   Problem: Education: Goal: Knowledge of General Education information will improve Description: Including pain rating scale, medication(s)/side effects and non-pharmacologic comfort measures Outcome: Progressing   Problem: Health Behavior/Discharge Planning: Goal: Ability to manage health-related needs will improve Outcome: Progressing   Problem: Clinical Measurements: Goal: Ability to maintain clinical measurements within normal limits will improve Outcome: Progressing Goal: Will remain free from infection Outcome: Progressing   Problem: Activity: Goal: Risk for activity intolerance will decrease Outcome: Progressing   Problem: Nutrition: Goal: Adequate nutrition will be maintained Outcome: Progressing   Problem: Coping: Goal: Level of anxiety will decrease Outcome: Progressing   Problem: Elimination: Goal: Will not experience complications related to bowel motility Outcome: Progressing   Problem: Safety: Goal: Ability to remain free from injury will improve Outcome: Progressing   Problem: Skin Integrity: Goal: Risk for impaired skin integrity will decrease Outcome: Progressing

## 2023-02-27 NOTE — TOC Progression Note (Signed)
Transition of Care [facility]) - Progression Note    Patient Details  Name: Dennis [name] MRN: [id number] Date of Birth: 1946/03/13  [facility] CM/SW Contact  Mearl Latin, LCSW Phone Number: 02/27/2023, 1:06 PM  Clinical Narrative:    1:06 PM-CSW left voicemail for patient's neighbor [name] to see if she can assist in coordinating with patient's significant other regarding SNF.    Expected Discharge Plan: Skilled Nursing Facility Barriers to Discharge: Continued Medical Work up, Requiring sitter/restraints, SNF Pending bed offer, Unsafe home situation  Expected Discharge Plan and Services In-house Referral: Clinical Social Work   Post Acute Care Choice: Skilled Nursing Facility Living arrangements for the past 2 months: Single Family Home                                       Social Determinants of Health (SDOH) Interventions SDOH Screenings   Food Insecurity: No Food Insecurity (07/26/2020)   Received from [facility], [facility]  Transportation Needs: No Transportation Needs (04/04/2022)   Received from [facility], [facility]  Tobacco Use: Medium Risk (02/22/2023)    Readmission Risk Interventions     No data to display

## 2023-02-27 NOTE — Progress Notes (Signed)
PROGRESS NOTE                                                                                                                                                                                                             Patient Demographics:    Dennis [name], is a 77 y.o. male, DOB - 1946-02-12, ZOX:[id number]  Outpatient Primary MD for the patient is Pcp, No    LOS - 5  Admit date - 02/22/2023    Chief Complaint  Patient presents with   Altered Mental Status       Brief Narrative (HPI from H&P)   77 year old male with past medical history of HLD, BPH, HTN, rheumatoid arthritis, COPD, Barrett's esophagus, OSA does not use CPAP, history of CVA. Patient sent in for altered mentation. At baseline patient has a ataxic gait but is not confused. Over the course of the past week he has become increasingly confused. He was brought to the ER.  In the ER he was trying to escape from the ER room had to be IVC need placed with 1 is to 1 sitter, had 2 CT scans of the head which were unremarkable, he was admitted for further workup of encephalopathy.   Subjective:   Patient in bed awake but confused, denies any headache or chest pain   Assessment  & Plan :   Gradually progressive metabolic encephalopathy over the course of 5 to 7 days.  Brought in by wife for worsening confusion, confusion persisted in the ER, CT head x 2 several hours apart is nonacute, no focal deficits apparent. He was afebrile, no leukocytosis, CRP and ESR are stable, head CT x 2 stable, ABG stable, RPR stable, ammonia stable, stable B12, TSH, A1c, MRI and EEG x2, LP done after much effort by IR under fluoroscopy on 02/26/2023 rules out bacterial meningitis, antibiotics discontinued on 02/27/2023, case discussed with neurology and ID Dr. Algis Liming, till HSV PCR is back acyclovir with IV fluids will be continued.  Talking to the neighbors it has become clear that patient has had  gradual downward decline which sounds like dementia for several months, unfortunately wife is quite demented as well, Adult Protective Services has been involved in the past and has been involved again now.  He has a very poor baseline which has been gradually declining over several months due to  underlying undiagnosed dementia.  Will require placement.   Dehydration, reduced urine output, AKI.  Poor oral intake likely due to confusion as above, hold ARB, hydrate with IV fluids.  Bladder scan has 53 cc of urine.  History of rheumatoid arthritis.  Post discharge resume methotrexate and sulfasalazine.  Stable CRP ESR.  BPH.  Proscar and Flomax if he can tolerate oral medications.  Hypertension.  Continue Coreg, ARB held due to AKI, as needed hydralazine.  Dyslipidemia.  Check a.m. lipid panel.  Hypokalemia.  Replaced.  Nonspecific EKG changes.  Denies any chest pain.  Coreg and statin, noted with EF slightly depressed and some wall motion abnormalities, have to hold antiplatelets due to pending LP.  Troponin trend was stable and in non-ACS pattern, once mentation improves cardiology input.       Condition - Extremely Guarded  Family Communication  : Called wife [name] [phone] twice on 02/23/2023 at 8 AM and 10 AM, no response, message left on answering machine on her cell phone.  Wife informs me that her cell phone is broken, discussed with her in detail in room on 02/23/2023.  [name] (Neighbor) [phone] (Mobile) was updated message left morning of 02/24/2023 at 10:04 AM, called again afternoon of 02/24/2023, call 02/25/2023 at 10:22 AM message left, updated neighbor on 02/26/2023, 02/27/2023  Called on 02/25/2023 [phone]  at 11 AM over 20 rings, no response   Code Status :  Full  Consults  :  Neuro  PUD Prophylaxis :  PPI   Procedures  :     LP -   Echocardiogram.  1. Left ventricular ejection fraction, by estimation, is 45 to 50%. Left ventricular ejection  fraction by 2D MOD biplane is 49.5 %. The left ventricle has mildly decreased function. The left ventricle demonstrates regional wall motion abnormalities (see  scoring diagram/findings for description). There is mild left ventricular hypertrophy. Left ventricular diastolic function could not be evaluated.  2. Right ventricular systolic function is normal. The right ventricular size is normal.  3. The mitral valve is normal in structure. No evidence of mitral valve regurgitation. No evidence of mitral stenosis.  4. The aortic valve is normal in structure. There is moderate calcification of the aortic valve. There is moderate thickening of the aortic valve. Aortic valve regurgitation is trivial. No aortic stenosis is present.  5. The inferior vena cava is normal in size with greater than 50% respiratory variability, suggesting right atrial pressure of 3 mmHg.   CT head x 2.  Nonacute.    MRI brain.  1. No acute intracranial abnormality. 2. Moderate generalized atrophy and white matter disease likely reflects the sequela of chronic microvascular ischemia. 3. Remote lacunar infarct of the right thalamus. 4. Bilateral mastoid effusions. No obstructing nasopharyngeal lesion is present  EEG.  No active seizures      Disposition Plan  :    Status is: Inpatient  DVT Prophylaxis  :    Place and maintain sequential compression device Start: 02/23/23 1015    Lab Results  Component Value Date   PLT 211 02/27/2023    Diet :  Diet Order             DIET DYS 3 Room service appropriate? Yes; Fluid consistency: Thin  Diet effective now                    Inpatient Medications  Scheduled Meds:  atorvastatin  40 mg Oral Daily   carvedilol  3.125  mg Oral BID WC   finasteride  5 mg Oral Daily   folic acid  1 mg Oral Daily   pantoprazole  40 mg Oral Daily   tamsulosin  0.4 mg Oral QPC supper   Continuous Infusions:  acyclovir 680 mg (02/27/23 1011)   lactated ringers 50 mL/hr at 02/27/23  0509   PRN Meds:.acetaminophen **OR** acetaminophen, haloperidol lactate, hydrALAZINE, ondansetron **OR** ondansetron (ZOFRAN) IV, senna-docusate     Objective:   Vitals:   02/26/23 2223 02/26/23 2340 02/27/23 0345 02/27/23 0758  BP: (!) 147/56 119/76 (!) 168/79 137/67  Pulse: 71 75 74 75  Resp: 17 16 17 12   Temp:  97.8 F (36.6 C) 98 F (36.7 C) 98.6 F (37 C)  TempSrc:  Axillary Oral Oral  SpO2: 97% 98% 98% 97%  Weight:      Height:        Wt Readings from Last 3 Encounters:  02/22/23 68 kg  02/21/23 68 kg  12/25/22 68 kg     Intake/Output Summary (Last 24 hours) at 02/27/2023 1040 Last data filed at 02/27/2023 0515 Gross per 24 hour  Intake 3074.44 ml  Output 400 ml  Net 2674.44 ml      Physical Exam  In bed awake but remains confused, supple neck no focal deficits [place].AT,PERRAL Supple Neck, No JVD,   Symmetrical Chest wall movement, Good air movement bilaterally, CTAB RRR,No Gallops,Rubs or new Murmurs,  +ve B.Sounds, Abd Soft, No tenderness,   No Cyanosis, Clubbing or edema        Data Review:    Recent Labs  Lab 02/23/23 0739 02/24/23 0737 02/25/23 0324 02/26/23 0554 02/27/23 0553  WBC 9.0 9.3 10.0 8.6 8.0  HGB 13.0 13.3 13.3 11.7* 11.0*  HCT 40.6 41.3 40.2 35.1* 34.8*  PLT 228 215 239 224 211  MCV 93.3 91.2 90.3 89.3 89.5  MCH 29.9 29.4 29.9 29.8 28.3  MCHC 32.0 32.2 33.1 33.3 31.6  RDW 14.0 14.0 14.0 14.1 14.1  LYMPHSABS 2.0 1.7 1.9 1.9 1.4  MONOABS 1.2* 0.8 1.0 0.9 0.8  EOSABS 0.0 0.1 0.1 0.1 0.2  BASOSABS 0.1 0.0 0.1 0.1 0.0    Recent Labs  Lab 02/21/23 1254 02/21/23 1304 02/22/23 1332 02/22/23 2128 02/23/23 0038 02/23/23 0452 02/23/23 1053 02/23/23 1059 02/24/23 0737 02/25/23 0324 02/26/23 0340 02/26/23 0554 02/27/23 0553  NA 138   < > 139  --  140  --   --   --  135 137  --  140 139  K 4.3   < > 4.0  --  3.4*  --   --   --  3.6 3.5  --  3.7 3.5  CL 102   < > 102  --  103  --   --   --  105 105  --  107 107  CO2 30   --  26  --  25  --   --   --  23 22  --  24 24  ANIONGAP 6  --  11  --  12  --   --   --  7 10  --  9 8  GLUCOSE 101*   < > 94  --  122*  --   --   --  98 94  --  87 83  BUN 15   < > 13  --  14  --   --   --  10 11  --  10 11  CREATININE 1.17   < >  1.19 1.23 1.33*  --   --   --  1.21 1.10  --  1.24 0.97  AST 21  --  20  --   --   --   --   --  30 34  --  32 22  ALT 17  --  15  --   --   --   --   --  16 17  --  14 15  ALKPHOS 34*  --  34*  --   --   --   --   --  29* 33*  --  27* 27*  BILITOT 0.6  --  0.3  --   --   --   --   --  0.6 0.8  --  0.4 0.8  ALBUMIN 3.5  --  3.5  --   --   --   --   --  2.9* 3.0*  --  2.5* 2.3*  CRP  --   --   --   --  0.5  --   --   --  0.8 2.3* 4.3*  --  3.3*  PROCALCITON  --   --   --   --   --   --  <0.10  --  <0.10 <0.10  --  <0.10 <0.10  INR 1.0  --   --   --   --   --   --   --   --   --   --   --   --   TSH  --   --   --  3.180  --   --   --   --   --   --   --   --   --   HGBA1C  --   --   --   --   --   --   --  5.5  --   --   --   --   --   AMMONIA  --   --   --   --   --  12  --   --   --   --   --   --   --   BNP  --   --   --   --   --   --   --   --  408.6* 460.0*  --  965.2* 470.3*  MG  --   --   --   --   --   --   --   --  1.7 1.9  --  1.9 1.9  CALCIUM 11.6*  --  11.6*  --  10.8*  --   --   --  9.4 9.0  --  8.6* 8.4*   < > = values in this interval not displayed.      Recent Labs  Lab 02/21/23 1254 02/22/23 1332 02/22/23 2128 02/23/23 0038 02/23/23 0452 02/23/23 1053 02/23/23 1059 02/24/23 0737 02/25/23 0324 02/26/23 0340 02/26/23 0554 02/27/23 0553  CRP  --   --   --  0.5  --   --   --  0.8 2.3* 4.3*  --  3.3*  PROCALCITON  --   --   --   --   --  <0.10  --  <0.10 <0.10  --  <0.10 <0.10  INR 1.0  --   --   --   --   --   --   --   --   --   --   --  TSH  --   --  3.180  --   --   --   --   --   --   --   --   --   HGBA1C  --   --   --   --   --   --  5.5  --   --   --   --   --   AMMONIA  --   --   --   --  12  --   --   --    --   --   --   --   BNP  --   --   --   --   --   --   --  408.6* 460.0*  --  965.2* 470.3*  MG  --   --   --   --   --   --   --  1.7 1.9  --  1.9 1.9  CALCIUM 11.6*   < >  --  10.8*  --   --   --  9.4 9.0  --  8.6* 8.4*   < > = values in this interval not displayed.    --------------------------------------------------------------------------------------------------------------- Lab Results  Component Value Date   CHOL 152 02/24/2023   HDL 28 (L) 02/24/2023   LDLCALC 105 (H) 02/24/2023   TRIG 94 02/24/2023   CHOLHDL 5.4 02/24/2023    Lab Results  Component Value Date   HGBA1C 5.5 02/23/2023   No results for input(s): "TSH", "T4TOTAL", "FREET4", "T3FREE", "THYROIDAB" in the last 72 hours.  No results for input(s): "VITAMINB12", "FOLATE", "FERRITIN", "TIBC", "IRON", "RETICCTPCT" in the last 72 hours.  ------------------------------------------------------------------------------------------------------------------ Cardiac Enzymes No results for input(s): "CKMB", "TROPONINI", "MYOGLOBIN" in the last 168 hours.  Invalid input(s): "CK"  Micro Results Recent Results (from the past 240 hour(s))  MRSA Next Gen by PCR, Nasal     Status: None   Collection Time: 02/23/23  3:31 AM   Specimen: Nasal Mucosa; Nasal Swab  Result Value Ref Range Status   MRSA by PCR Next Gen NOT DETECTED NOT DETECTED Final    Comment: (NOTE) The GeneXpert MRSA Assay (FDA approved for NASAL specimens only), is one component of a comprehensive MRSA colonization surveillance program. It is not intended to diagnose MRSA infection nor to guide or monitor treatment for MRSA infections. Test performance is not FDA approved in patients less than 77 years old. Performed at [facility], [address], [place], [place] [address]   CSF culture w Gram Stain     Status: None (Preliminary result)   Collection Time: 02/24/23  2:18 PM   Specimen: CSF; Cerebrospinal Fluid  Result Value Ref Range Status    Specimen Description CSF  Final   Special Requests NONE  Final   Gram Stain   Final    WBC PRESENT,BOTH PMN AND MONONUCLEAR NO ORGANISMS SEEN CYTOSPIN SMEAR Performed at [facility], [address] [place], [place] [address]    Culture PENDING  Incomplete   Report Status PENDING  Incomplete    Radiology Reports DG Chest Port 1 View  Result Date: 02/27/2023 CLINICAL DATA:  77 year old male with history of shortness of breath. EXAM: PORTABLE CHEST 1 VIEW COMPARISON:  Chest x-ray 12/25/2022. FINDINGS: Lung volumes are normal. Opacity in the left base partially obscuring the lateral left hemidiaphragm. Ill-defined opacity also noted in the apex of the right lung (although this could potentially simply reflect prominent first  costochondral junction). No pleural effusions. No pneumothorax. No evidence of pulmonary edema. Heart size is borderline enlarged. Upper mediastinal contours are within normal limits. Atherosclerotic calcifications are noted in the thoracic aorta. IMPRESSION: 1. Opacities in the right upper lobe near the apex and in the periphery of the left lung base. These are new compared to the recent prior study from 12/25/2022, concerning for developing areas of airspace consolidation, although an underlying mass in the right upper lobe is not excluded. Followup PA and lateral chest X-ray is recommended in 3-4 weeks following trial of antibiotic therapy to ensure resolution and exclude underlying malignancy. Alternatively, if there is strong clinical concern for malignancy, further evaluation with contrast-enhanced chest CT could be considered at this time. 2. Aortic atherosclerosis. Electronically Signed   By: Trudie Reed M.D.   On: 02/27/2023 07:02   DG FL GUIDED LUMBAR PUNCTURE  Result Date: 02/26/2023 CLINICAL DATA:  77 year old male with complaint of confusion, altered mental status. Request for lumbar puncture. EXAM: LUMBAR PUNCTURE UNDER FLUOROSCOPY PROCEDURE: An  appropriate skin entry site was determined fluoroscopically. Operator donned sterile gloves and mask. Skin site was marked, then prepped with Betadine, draped in usual sterile fashion, and infiltrated locally with 1% lidocaine. A 20 gauge spinal needle advanced into the thecal sac at L2-L3 from a right interlaminar approach. Clear colorless CSF spontaneously returned, with opening pressure of 20 cm water. 5 ml CSF were collected and divided among 4 sterile vials for the requested laboratory studies. The needle was then removed. The patient restless, not following commands during procedure, but ultimately tolerated the procedure well and there were no complications. FLUOROSCOPY: Radiation Exposure Index (as provided by the fluoroscopic device): 2.6 mGy Kerma IMPRESSION: Technically successful lumbar puncture under fluoroscopy. This exam was performed by Loyce Dys PA-C, and was supervised and interpreted by Ruel Favors, MD. Electronically Signed   By: Judie Petit.  Shick M.D.   On: 02/26/2023 14:48   Overnight EEG with video  Result Date: 02/26/2023 Charlsie Quest, MD     02/26/2023  9:13 AM Patient Name: ELIEL DUDDING MRN: [id number] Epilepsy Attending: Charlsie Quest Referring Physician/Provider: Marjorie Smolder, NP Duration: 02/25/2023 1353 to 02/26/2023 0900 Patient history: 77yo M with ams getting eeg to evaluate for seizure Level of alertness: lethargic , asleep AEDs during EEG study: None Technical aspects: This EEG study was done with scalp electrodes positioned according to the 10-20 International system of electrode placement. Electrical activity was reviewed with band pass filter of 1-70Hz , sensitivity of 7 uV/mm, display speed of 40mm/sec with a 60Hz  notched filter applied as appropriate. EEG data were recorded continuously and digitally stored.  Video monitoring was available and reviewed as appropriate. Description: No clear posterior dominant rhythm was seen.  Sleep was characterized by sleep  symptoms (12 to 14 Hz), maximal frontocentral region.  EEG showed continuous generalized 3 to 5 Hz theta-delta slowing. Hyperventilation and photic stimulation were not performed.   ABNORMALITY - Continuous slow, generalized IMPRESSION: 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 No seizures or epileptiform discharges were seen throughout the recording. Charlsie Quest   ECHOCARDIOGRAM COMPLETE  Result Date: 02/24/2023    ECHOCARDIOGRAM REPORT   Patient Name:   VAHAN WADSWORTH Date of Exam: 02/24/2023 Medical Rec #:  [id number]     Height:       64.0 in Accession #:    [id number]    Weight:       149.9 lb Date of  Birth:  09-15-1945     BSA:          1.731 m Patient Age:    77 years      BP:           118/94 mmHg Patient Gender: M             HR:           88 bpm. Exam Location:  Inpatient Procedure: 2D Echo, Color Doppler and Cardiac Doppler Indications:    Abnormal ECG  History:        Patient has no prior history of Echocardiogram examinations.                 Signs/Symptoms:Altered Mental Status; Risk Factors:Hypertension.  Sonographer:    Milda Smart Referring Phys: Stanford Scotland [facility]  Sonographer Comments: Image acquisition challenging due to patient body habitus. Pt scanned supine due to restraints. IMPRESSIONS  1. Left ventricular ejection fraction, by estimation, is 45 to 50%. Left ventricular ejection fraction by 2D MOD biplane is 49.5 %. The left ventricle has mildly decreased function. The left ventricle demonstrates regional wall motion abnormalities (see  scoring diagram/findings for description). There is mild left ventricular hypertrophy. Left ventricular diastolic function could not be evaluated.  2. Right ventricular systolic function is normal. The right ventricular size is normal.  3. The mitral valve is normal in structure. No evidence of mitral valve regurgitation. No evidence of mitral stenosis.  4. The aortic valve is normal in structure. There is moderate calcification of  the aortic valve. There is moderate thickening of the aortic valve. Aortic valve regurgitation is trivial. No aortic stenosis is present.  5. The inferior vena cava is normal in size with greater than 50% respiratory variability, suggesting right atrial pressure of 3 mmHg. FINDINGS  Left Ventricle: Left ventricular ejection fraction, by estimation, is 45 to 50%. Left ventricular ejection fraction by 2D MOD biplane is 49.5 %. The left ventricle has mildly decreased function. The left ventricle demonstrates regional wall motion abnormalities. The left ventricular internal cavity size was normal in size. There is mild left ventricular hypertrophy. Left ventricular diastolic function could not be evaluated.  LV Wall Scoring: The entire anterior wall and entire septum are hypokinetic. The entire lateral wall, entire inferior wall, and apex are normal. Right Ventricle: The right ventricular size is normal. No increase in right ventricular wall thickness. Right ventricular systolic function is normal. Left Atrium: Left atrial size was normal in size. Right Atrium: Right atrial size was normal in size. Pericardium: There is no evidence of pericardial effusion. Mitral Valve: The mitral valve is normal in structure. No evidence of mitral valve regurgitation. No evidence of mitral valve stenosis. Tricuspid Valve: The tricuspid valve is normal in structure. Tricuspid valve regurgitation is not demonstrated. No evidence of tricuspid stenosis. Aortic Valve: The aortic valve is normal in structure. There is moderate calcification of the aortic valve. There is moderate thickening of the aortic valve. Aortic valve regurgitation is trivial. No aortic stenosis is present. Pulmonic Valve: The pulmonic valve was normal in structure. Pulmonic valve regurgitation is not visualized. No evidence of pulmonic stenosis. Aorta: The aortic root is normal in size and structure. Venous: The inferior vena cava is normal in size with greater than  50% respiratory variability, suggesting right atrial pressure of 3 mmHg. IAS/Shunts: No atrial level shunt detected by color flow Doppler.  LEFT VENTRICLE PLAX 2D  Biplane EF (MOD) LVIDd:         4.10 cm         LV Biplane EF:   Left LVIDs:         3.20 cm                          ventricular LV PW:         1.00 cm                          ejection LV IVS:        1.20 cm                          fraction by LVOT diam:     2.40 cm                          2D MOD LVOT Area:     4.52 cm                         biplane is                                                 49.5 %.  LV Volumes (MOD)               Diastology LV vol d, MOD    39.7 ml       LV e' medial:  5.66 cm/s A2C:                           LV e' lateral: 6.53 cm/s LV vol d, MOD    51.4 ml A4C: LV vol s, MOD    22.3 ml A2C: LV vol s, MOD    21.5 ml A4C: LV SV MOD A2C:   17.4 ml LV SV MOD A4C:   51.4 ml LV SV MOD BP:    22.7 ml RIGHT VENTRICLE RV S prime:     14.90 cm/s TAPSE (M-mode): 1.8 cm LEFT ATRIUM           Index        RIGHT ATRIUM          Index LA diam:      4.30 cm 2.48 cm/m   RA Area:     6.91 cm LA Vol (A2C): 20.7 ml 11.96 ml/m  RA Volume:   10.10 ml 5.84 ml/m LA Vol (A4C): 26.2 ml 15.14 ml/m   AORTA Ao Root diam: 3.50 cm  SHUNTS Systemic Diam: 2.40 cm Chilton Si MD Electronically signed by Chilton Si MD Signature Date/Time: 02/24/2023/4:45:53 PM    Final    MR BRAIN W WO CONTRAST  Result Date: 02/23/2023 CLINICAL DATA:  Delirium. EXAM: MRI HEAD WITHOUT AND WITH CONTRAST TECHNIQUE: Multiplanar, multiecho pulse sequences of the brain and surrounding structures were obtained without and with intravenous contrast. CONTRAST:  7mL GADAVIST GADOBUTROL 1 MMOL/ML IV SOLN COMPARISON:  CT head without contrast 02/22/2023. FINDINGS: Brain: No acute infarct, hemorrhage, or mass lesion is present. Moderate generalized atrophy and diffuse periventricular white matter changes are present bilaterally. Dilated  perivascular spaces are present within the basal ganglia. Remote  lacunar infarct is present in the right thalamus. The brainstem and cerebellum are within normal limits. The ventricles are proportionate to the degree of atrophy. No significant extraaxial fluid collection is present. Midline structures are within normal limits. Postcontrast images demonstrate no pathologic enhancement. Vascular: Flow is present in the major intracranial arteries. Skull and upper cervical spine: The craniocervical junction is normal. Upper cervical spine is within normal limits. Marrow signal is unremarkable. Sinuses/Orbits: Bilateral mastoid effusions are present. No obstructing nasopharyngeal lesion is present. A polyp or mucous retention cyst is present within an atelectatic right maxillary sinus. Paranasal sinuses are otherwise clear. The globes and orbits are within normal limits. IMPRESSION: 1. No acute intracranial abnormality. 2. Moderate generalized atrophy and white matter disease likely reflects the sequela of chronic microvascular ischemia. 3. Remote lacunar infarct of the right thalamus. 4. Bilateral mastoid effusions. No obstructing nasopharyngeal lesion is present. Electronically Signed   By: Marin Roberts M.D.   On: 02/23/2023 17:20      Signature  -   Susa Raring M.D on 02/27/2023 at 10:40 AM   -  To page go to [email or web address]

## 2023-02-27 NOTE — [place] FL2 (Signed)
[place] MEDICAID FL2 LEVEL OF CARE FORM     IDENTIFICATION  Patient Name: Dennis [name] Birthdate: 1945/12/22 Sex: male Admission Date (Current Location): 02/22/2023  [facility] and [place] Number:  Producer, television/film/video and Address:  [facility] [place], [address], [place], [place] [address]      Provider Number: [id number]  Attending Physician Name and Address:  Leroy Sea, MD  Relative Name and Phone Number:       Current Level of Care: Hospital Recommended Level of Care: Skilled Nursing Facility Prior Approval Number:    Date Approved/Denied:   PASRR Number: [id number]  Discharge Plan: SNF    Current Diagnoses: Patient Active Problem List   Diagnosis Date Noted   Altered mental state 02/22/2023   Difficulty walking 02/22/2023   HLD (hyperlipidemia) 06/02/2009   HTN (hypertension) 06/02/2009   AORTIC INSUFFICIENCY, MILD 06/02/2009   DYSPNEA ON EXERTION 06/02/2009   CHEST PAIN, ATYPICAL 06/02/2009    Orientation RESPIRATION BLADDER Height & Weight     Self  O2 (2L nasal cannula) Incontinent, External catheter Weight: 149 lb 14.6 oz (68 kg) Height:  5\' 4"  (162.6 cm)  BEHAVIORAL SYMPTOMS/MOOD NEUROLOGICAL BOWEL NUTRITION STATUS      Continent Diet (See dc summary)  AMBULATORY STATUS COMMUNICATION OF NEEDS Skin   Extensive Assist Verbally Normal                       Personal Care Assistance Level of Assistance  Bathing, Feeding, Dressing Bathing Assistance: Maximum assistance Feeding assistance: Maximum assistance Dressing Assistance: Maximum assistance     Functional Limitations Info  Sight, Hearing Sight Info: Impaired Hearing Info: Impaired      SPECIAL CARE FACTORS FREQUENCY  PT (By licensed PT), OT (By licensed OT), Speech therapy     PT Frequency: 5x/week OT Frequency: 5x/week     Speech Therapy Frequency: 1x/week      Contractures Contractures Info: Not present    Additional Factors Info  Code  Status, Allergies Code Status Info: Full Allergies Info: Codeine, Shellfish Allergy           Current Medications (02/27/2023):  This is the current hospital active medication list Current Facility-Administered Medications  Medication Dose Route Frequency Provider Last Rate Last Admin   acetaminophen (TYLENOL) tablet 650 mg  650 mg Oral Q6H PRN Crosley, Debby, MD       Or   acetaminophen (TYLENOL) suppository 650 mg  650 mg Rectal Q6H PRN Crosley, Debby, MD       atorvastatin (LIPITOR) tablet 40 mg  40 mg Oral Daily Leroy Sea, MD   40 mg at 02/27/23 1004   carvedilol (COREG) tablet 3.125 mg  3.125 mg Oral BID WC Crosley, Debby, MD   3.125 mg at 02/27/23 1004   finasteride (PROSCAR) tablet 5 mg  5 mg Oral Daily Crosley, Debby, MD   5 mg at 02/27/23 1004   folic acid (FOLVITE) tablet 1 mg  1 mg Oral Daily Susa Raring K, MD   1 mg at 02/27/23 1004   haloperidol lactate (HALDOL) injection 5 mg  5 mg Intramuscular Q6H PRN Leroy Sea, MD   5 mg at 02/25/23 2353   hydrALAZINE (APRESOLINE) injection 10 mg  10 mg Intravenous Q6H PRN Leroy Sea, MD   10 mg at 02/24/23 2326   lactated ringers infusion   Intravenous Continuous Leroy Sea, MD 50 mL/hr at 02/27/23 0509 New Bag at  02/27/23 0509   ondansetron (ZOFRAN) tablet 4 mg  4 mg Oral Q6H PRN Gery Pray, MD       Or   ondansetron (ZOFRAN) injection 4 mg  4 mg Intravenous Q6H PRN Crosley, Debby, MD       pantoprazole (PROTONIX) EC tablet 40 mg  40 mg Oral Daily Pham, Minh Q, RPH-CPP   40 mg at 02/27/23 1004   senna-docusate (Senokot-S) tablet 1 tablet  1 tablet Oral QHS PRN Crosley, Debby, MD       tamsulosin (FLOMAX) capsule 0.4 mg  0.4 mg Oral QPC supper Gery Pray, MD   0.4 mg at 02/26/23 1809     Discharge Medications: Please see discharge summary for a list of discharge medications.  Relevant Imaging Results:  Relevant Lab Results:   Additional Information SSN: [id number]  Mearl Latin,  LCSW

## 2023-02-27 NOTE — Progress Notes (Signed)
Neurology Progress Note  Brief HPI: 77 year old patient with history of arthritis, hypertension, hyperlipidemia, BPH, rheumatoid arthritis, COPD, sleep apnea and stroke presented with altered mentation.  He was noted to be quite confused in the ED and required one-to-one sitter for some time.  He denies headache but does endorse some neck pain on rotation and palpation.  He is afebrile and has no leukocytosis, there is some suspicion for viral or rickettsial meningitis given that patient spends a lot of time outdoors.  Subjective: CSF benign.  Able to touch base with the neighbor states that she has noted a slow cognitive decline over the last couple of years.  Exam: Vitals:   02/27/23 0345 02/27/23 0758  BP: (!) 168/79 137/67  Pulse: 74 75  Resp: 17 12  Temp: 98 F (36.7 C) 98.6 F (37 C)  SpO2: 98% 97%   Gen: In bed, NAD Resp: non-labored breathing, no acute distress Abd: soft, nt  Neuro: Mental Status: Alert eyes open, states name, states he is at [facility], able to have a conversation initially and then fixates on needing to use the bathroom. Does not answer further questions. Cranial Nerves: Pupils equal round and reactive to light, extraocular movements intact, face symmetrical, phonation normal, hearing intact to voice, tongue midline Motor: Able to move all 4 extremities with good antigravity strength Sensory: Intact to light touch throughout Gait: Deferred  Pertinent Labs:    Latest Ref Rng & Units 02/27/2023    5:53 AM 02/26/2023    5:54 AM 02/25/2023    3:24 AM  CBC  WBC 4.0 - 10.5 K/uL 8.0  8.6  10.0   Hemoglobin 13.0 - 17.0 g/dL 40.9  81.1  91.4   Hematocrit 39.0 - 52.0 % 34.8  35.1  40.2   Platelets 150 - 400 K/uL 211  224  239        Latest Ref Rng & Units 02/27/2023    5:53 AM 02/26/2023    5:54 AM 02/25/2023    3:24 AM  BMP  Glucose 70 - 99 mg/dL 83  87  94   BUN 8 - 23 mg/dL 11  10  11    Creatinine 0.61 - 1.24 mg/dL 7.82  9.56  2.13   Sodium  135 - 145 mmol/L 139  140  137   Potassium 3.5 - 5.1 mmol/L 3.5  3.7  3.5   Chloride 98 - 111 mmol/L 107  107  105   CO2 22 - 32 mmol/L 24  24  22    Calcium 8.9 - 10.3 mg/dL 8.4  8.6  9.0    Lab Results  Component Value Date   GLUCCSF 56 02/24/2023   PROTEINCSF 48 (H) 02/24/2023   Lab Results  Component Value Date   VITAMINB12 611 02/23/2023   CRP 3.3 (H) 02/27/2023   FOLATE 8.4 02/23/2023    Meningitis/encephalitis panel negative  Spotted fever group antibodies pending  Imaging Reviewed:  CT head: No acute abnormality  MRI brain: No acute abnormality, moderate generalized atrophy and chronic white matter disease, remote lacunar infarct of right thalamus and bilateral mastoid effusions  EEG: Generalized irregular slow activity consistent with generalized nonspecific encephalopathy with no seizures or epileptiform discharges  LTM EEG- 02/25/2023 1353 to 02/26/2023 0900  ABNORMALITY: - Continuous slow, generalized IMPRESSION: 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 No seizures or epileptiform discharges were seen throughout the recording.  Assessment: 77 year old patient with history of arthritis, hypertension, hyperlipidemia, BPH, rheumatoid arthritis, COPD, sleep apnea  and stroke presented originally with altered mentation.  On exam, he was noted to have neck pain with palpation and head rotation as well as confusion.  He does not have a fever or leukocytosis, suspicion is high for viral or rickettsial meningitis given the patient spends a lot of time outdoors.  Patient will need lumbar puncture to evaluate for this.  Unfortunately, patient's wife's phone is broken and we cannot reach her for consent, so we will need to proceed with two-physician consent for procedure. Patient is already on appropriate antibiotics and antivirals for meningitis coverage.  EEG was negative for seizure activity, and imaging of the brain reveals no acute abnormality.  LP completed 10/9,  CSF benign.  LTM discontinued  Impression: Altered mental status with differential to include viral or rickettsial meningitis, toxic metabolic encephalopathy, seizure activity and delirium Given input from neighbor who has noticed a slow cognitive decline over the last couple of years this is likely dementia with a component of delirium.  Recommendations: -Add multivitamin -Delirium precautions   Patient seen and examined by NP/APP with MD. MD to update note as needed.   Elmer Picker, DNP, FNP-BC [facility] Pager: [phone]   NEUROHOSPITALIST ADDENDUM Performed a face to face diagnostic evaluation.   I have reviewed the contents of history and physical exam as documented by PA/ARNP/Resident and agree with above documentation.  I have discussed and formulated the above plan as documented. Edits to the note have been made as needed.  Impression/Key exam findings/Plan: Neighbor and wife at bedside and upon elicit more background and history, Mr. KAUSHIK MAUL has had a gradual decline over last few years with concern for memory impairment, cognitive decline noted by wife and neighbor. CSF studies yesterday are not concerning for meningitis/encephalitis.  In light of the new information provided by family, I suspect that his current presentation is likely delirium in the setting of long standing and undiagnosed dementia.  Recommend melatonin at bedtime and delirium precautions and follow up outpatient with neurology.  We will signoff. Plan discussed with Dr. Thedore Mins over phone.  Erick Blinks, MD [facility] [phone]   If 7pm to 7am, please call on call as listed on AMION.

## 2023-02-27 NOTE — Plan of Care (Signed)
  Problem: Safety: Goal: Non-violent Restraint(s) Outcome: Completed/Met   

## 2023-02-27 NOTE — Progress Notes (Signed)
Speech Language Pathology Treatment: Dysphagia  Patient Details Name: Dennis [name] MRN: [id number] DOB: 1945/11/22 Today's Date: 02/27/2023 Time: 0865-7846 SLP Time Calculation (min) (ACUTE ONLY): 27 min  Assessment / Plan / Recommendation Clinical Impression  Patient seen for skilled intervention focused on diet toleration and advanced PO trials. Patient was asleep when SLP entered the room but awoke to sound of voice. He was cooperative but confused throughout the session. He appeared to be internally distracted and many utterances were nonsensical. SLP completed oral care upon patient indicating he had something in his mouth. Patient's oral cavity had thick, sticky secretions and his tongue had a white coating. SLP administered thin liquid via cup and straw and regular solid (graham cracker). Patient took first sip from a straw, manipulated it around his oral cavity, and then expelled the bolus out. He stated he felt like he was going to choke and didn't know he was supposed to swallow it. SLP administered a second straw sip and cued the patient to swallow. Swallow was initiated with no overt s/sx or complaints of aspiration. SLP attempted a third sip via straw, but patient began to blow through the straw. Cup sip was then initiated with success. Solid food administration resulted in prolonged bolus formation and immediate coughing. Given patient's lack of dentition, cognitive impairments, and aspiration risk, SLP recommended downgrade to Dys 2 (mined) and thin liquids. ST will continue to follow up for diet toleration and potential for advanced PO trials.  HPI HPI: Dennis [name] is a 77 yo male presenting to ED 10/5 with AMS. CTH negative. MRI Brain pending. PMH includes HLD, BPH, HTN, rheumatoid arthritis, COPD, Barrett's esophagus, OSA, prior CVA; ST saw for BSE on 10/6 with impaired mentation impacting swallow function.  ST f/u for PO trials/swallow re-assessment.      SLP Plan  Continue with  current plan of care      Recommendations for follow up therapy are one component of a multi-disciplinary discharge planning process, led by the attending physician.  Recommendations may be updated based on patient status, additional functional criteria and insurance authorization.    Recommendations  Diet recommendations: Dysphagia 2 (fine chop);Thin liquid Liquids provided via: Cup;Straw Medication Administration: Whole meds with puree Supervision: Full supervision/cueing for compensatory strategies;Trained caregiver to feed patient Compensations: Slow rate;Small sips/bites;Minimize environmental distractions Postural Changes and/or Swallow Maneuvers: Seated upright 90 degrees;Upright 30-60 min after meal                  Oral care BID;Staff/trained caregiver to provide oral care   Frequent or constant Supervision/Assistance Dysphagia, unspecified (R13.10)     Continue with current plan of care     Marline Backbone, Senaida Lange., Speech Therapy Student   02/27/2023, 12:26 PM

## 2023-02-27 NOTE — Plan of Care (Signed)

## 2023-02-28 DIAGNOSIS — R410 Disorientation, unspecified: Secondary | ICD-10-CM | POA: Diagnosis not present

## 2023-02-28 LAB — CBC WITH DIFFERENTIAL/PLATELET
Abs Immature Granulocytes: 0.08 10*3/uL — ABNORMAL HIGH (ref 0.00–0.07)
Basophils Absolute: 0 10*3/uL (ref 0.0–0.1)
Basophils Relative: 0 %
Eosinophils Absolute: 0.1 10*3/uL (ref 0.0–0.5)
Eosinophils Relative: 1 %
HCT: 39.6 % (ref 39.0–52.0)
Hemoglobin: 12.9 g/dL — ABNORMAL LOW (ref 13.0–17.0)
Immature Granulocytes: 1 %
Lymphocytes Relative: 15 %
Lymphs Abs: 1.6 10*3/uL (ref 0.7–4.0)
MCH: 28.9 pg (ref 26.0–34.0)
MCHC: 32.6 g/dL (ref 30.0–36.0)
MCV: 88.6 fL (ref 80.0–100.0)
Monocytes Absolute: 1.1 10*3/uL — ABNORMAL HIGH (ref 0.1–1.0)
Monocytes Relative: 11 %
Neutro Abs: 7.2 10*3/uL (ref 1.7–7.7)
Neutrophils Relative %: 72 %
Platelets: 252 10*3/uL (ref 150–400)
RBC: 4.47 MIL/uL (ref 4.22–5.81)
RDW: 14 % (ref 11.5–15.5)
WBC: 10.1 10*3/uL (ref 4.0–10.5)
nRBC: 0 % (ref 0.0–0.2)

## 2023-02-28 LAB — COMPREHENSIVE METABOLIC PANEL
ALT: 15 U/L (ref 0–44)
AST: 24 U/L (ref 15–41)
Albumin: 2.7 g/dL — ABNORMAL LOW (ref 3.5–5.0)
Alkaline Phosphatase: 31 U/L — ABNORMAL LOW (ref 38–126)
Anion gap: 9 (ref 5–15)
BUN: 8 mg/dL (ref 8–23)
CO2: 25 mmol/L (ref 22–32)
Calcium: 8.8 mg/dL — ABNORMAL LOW (ref 8.9–10.3)
Chloride: 104 mmol/L (ref 98–111)
Creatinine, Ser: 0.93 mg/dL (ref 0.61–1.24)
GFR, Estimated: >60 mL/min (ref >60)
Glucose, Bld: 95 mg/dL (ref 70–99)
Potassium: 3.4 mmol/L — ABNORMAL LOW (ref 3.5–5.1)
Sodium: 138 mmol/L (ref 135–145)
Total Bilirubin: 0.7 mg/dL (ref 0.3–1.2)
Total Protein: 6.2 g/dL — ABNORMAL LOW (ref 6.5–8.1)

## 2023-02-28 LAB — MISC LABCORP TEST (SEND OUT): Labcorp test code: 505625

## 2023-02-28 LAB — PROCALCITONIN: Procalcitonin: <0.1 ng/mL

## 2023-02-28 MED ORDER — POTASSIUM CHLORIDE 10 MEQ/100ML IV SOLN
10.0000 meq | INTRAVENOUS | Status: AC
  Administered 2023-02-28 (×4): 10 meq via INTRAVENOUS
  Filled 2023-02-28 (×4): qty 100

## 2023-02-28 MED ORDER — CARVEDILOL 6.25 MG PO TABS
6.2500 mg | ORAL_TABLET | Freq: Two times a day (BID) | ORAL | Status: DC
  Administered 2023-02-28 – 2023-03-01 (×3): 6.25 mg via ORAL
  Filled 2023-02-28 (×3): qty 1

## 2023-02-28 MED ORDER — LABETALOL HCL 5 MG/ML IV SOLN
10.0000 mg | INTRAVENOUS | Status: AC
  Administered 2023-02-28: 10 mg via INTRAVENOUS
  Filled 2023-02-28: qty 4

## 2023-02-28 MED ORDER — CLOPIDOGREL BISULFATE 75 MG PO TABS
75.0000 mg | ORAL_TABLET | Freq: Every day | ORAL | Status: DC
  Administered 2023-02-28 – 2023-03-16 (×17): 75 mg via ORAL
  Filled 2023-02-28 (×18): qty 1

## 2023-02-28 NOTE — Progress Notes (Signed)
PROGRESS NOTE                                                                                                                                                                                                             Patient Demographics:    Dennis [name], is a 77 y.o. male, DOB - 08-Oct-1945, NGE:[id number]  Outpatient Primary MD for the patient is Pcp, No    LOS - 6  Admit date - 02/22/2023    Chief Complaint  Patient presents with   Altered Mental Status       Brief Narrative (HPI from H&P)   77 year old male with past medical history of HLD, BPH, HTN, rheumatoid arthritis, COPD, Barrett's esophagus, OSA does not use CPAP, history of CVA. Patient sent in for altered mentation. At baseline patient has a ataxic gait but is not confused. Over the course of the past week he has become increasingly confused. He was brought to the ER.  In the ER he was trying to escape from the ER room had to be IVC need placed with 1 is to 1 sitter, had 2 CT scans of the head which were unremarkable, he was admitted for further workup of encephalopathy.   Subjective:   Patient in bed awake but confused, denies any headache or chest pain   Assessment  & Plan :   Gradually progressive metabolic encephalopathy over the course of 5 to 7 days.  Brought in by wife for worsening confusion, confusion persisted in the ER, CT head x 2 several hours apart is nonacute, no focal deficits apparent. He was afebrile, no leukocytosis, CRP and ESR are stable, head CT x 2 stable, ABG stable, RPR stable, ammonia stable, stable B12, TSH, A1c, MRI and EEG x2, LP done after much effort by IR under fluoroscopy on 02/26/2023 rules out bacterial meningitis, CSF findings discussed with neurology and ID physician Dr. Algis Liming, not consistent with any meningitis encephalitis all antibiotics and acyclovir stopped 02/27/2023.  Talking to the neighbors it has become clear that  patient has had gradual downward decline which sounds like dementia for several months, unfortunately wife is quite demented as well, Adult Protective Services has been involved in the past and has been involved again now.  He has a very poor baseline which has been gradually declining over several months due to underlying undiagnosed  dementia.  Will require placement.   Dehydration, reduced urine output, AKI.  Poor oral intake likely due to confusion as above, hold ARB, hydrate with IV fluids.  Bladder scan has 53 cc of urine.  History of rheumatoid arthritis.  Post discharge resume methotrexate and sulfasalazine.  Stable CRP ESR.  BPH.  Proscar and Flomax if he can tolerate oral medications.  Hypertension.  Continue Coreg, dose adjusted for better control on 02/28/2023.  Dyslipidemia.  Check a.m. lipid panel.  Hypokalemia.  Placed IV.  Nonspecific EKG changes.  Denies any chest pain.  Coreg and statin, noted with EF slightly depressed and some wall motion abnormalities, have to hold antiplatelets due to pending LP.  Troponin trend was stable and in non-ACS pattern, outpatient cardiology follow-up not a candidate for any invasive procedure or testing due to underlying advanced dementia and poor functional status.  Will optimize medical care.  Continue Coreg, statin, and home dose Plavix.       Condition - Extremely Guarded  Family Communication  : Called wife [name] [phone] twice on 02/23/2023 at 8 AM and 10 AM, no response, message left on answering machine on her cell phone.  Wife informs me that her cell phone is broken, discussed with her in detail in room on 02/23/2023.  [name] (Neighbor) [phone] (Mobile) was updated message left morning of 02/24/2023 at 10:04 AM, called again afternoon of 02/24/2023, call 02/25/2023 at 10:22 AM message left, updated neighbor on 02/26/2023, 02/27/2023  Called on 02/25/2023 [phone]  at 11 AM over 20 rings, no response   Code Status :   Full  Consults  :  Neuro  PUD Prophylaxis :  PPI   Procedures  :     LP - CSF not consistent with any infection  Echocardiogram.  1. Left ventricular ejection fraction, by estimation, is 45 to 50%. Left ventricular ejection fraction by 2D MOD biplane is 49.5 %. The left ventricle has mildly decreased function. The left ventricle demonstrates regional wall motion abnormalities (see  scoring diagram/findings for description). There is mild left ventricular hypertrophy. Left ventricular diastolic function could not be evaluated.  2. Right ventricular systolic function is normal. The right ventricular size is normal.  3. The mitral valve is normal in structure. No evidence of mitral valve regurgitation. No evidence of mitral stenosis.  4. The aortic valve is normal in structure. There is moderate calcification of the aortic valve. There is moderate thickening of the aortic valve. Aortic valve regurgitation is trivial. No aortic stenosis is present.  5. The inferior vena cava is normal in size with greater than 50% respiratory variability, suggesting right atrial pressure of 3 mmHg.   CT head x 2.  Nonacute.    MRI brain.  1. No acute intracranial abnormality. 2. Moderate generalized atrophy and white matter disease likely reflects the sequela of chronic microvascular ischemia. 3. Remote lacunar infarct of the right thalamus. 4. Bilateral mastoid effusions. No obstructing nasopharyngeal lesion is present  EEG.  No active seizures      Disposition Plan  :    Status is: Inpatient  DVT Prophylaxis  :    Place and maintain sequential compression device Start: 02/23/23 1015    Lab Results  Component Value Date   PLT 252 02/28/2023    Diet :  Diet Order             DIET DYS 2 Room service appropriate? No; Fluid consistency: Thin  Diet effective now  Inpatient Medications  Scheduled Meds:  atorvastatin  40 mg Oral Daily   carvedilol  3.125 mg Oral BID WC    finasteride  5 mg Oral Daily   folic acid  1 mg Oral Daily   pantoprazole  40 mg Oral Daily   tamsulosin  0.4 mg Oral QPC supper   Continuous Infusions:  potassium chloride 10 mEq (02/28/23 0638)   PRN Meds:.acetaminophen **OR** acetaminophen, haloperidol lactate, hydrALAZINE, ondansetron **OR** ondansetron (ZOFRAN) IV, senna-docusate     Objective:   Vitals:   02/27/23 2347 02/28/23 0112 02/28/23 0214 02/28/23 0405  BP:  (!) 176/80 (!) 173/81 134/66  Pulse: 91  95 82  Resp: 17  18 19   Temp: 99.2 F (37.3 C)   99.4 F (37.4 C)  TempSrc: Axillary   Axillary  SpO2: 96%  96% 95%  Weight:      Height:        Wt Readings from Last 3 Encounters:  02/22/23 68 kg  02/21/23 68 kg  12/25/22 68 kg     Intake/Output Summary (Last 24 hours) at 02/28/2023 0744 Last data filed at 02/28/2023 0200 Gross per 24 hour  Intake 960 ml  Output 1800 ml  Net -840 ml      Physical Exam  In bed awake but remains confused, supple neck no focal deficits [place].AT,PERRAL Supple Neck, No JVD,   Symmetrical Chest wall movement, Good air movement bilaterally, CTAB RRR,No Gallops,Rubs or new Murmurs,  +ve B.Sounds, Abd Soft, No tenderness,   No Cyanosis, Clubbing or edema        Data Review:    Recent Labs  Lab 02/24/23 0737 02/25/23 0324 02/26/23 0554 02/27/23 0553 02/28/23 0350  WBC 9.3 10.0 8.6 8.0 10.1  HGB 13.3 13.3 11.7* 11.0* 12.9*  HCT 41.3 40.2 35.1* 34.8* 39.6  PLT 215 239 224 211 252  MCV 91.2 90.3 89.3 89.5 88.6  MCH 29.4 29.9 29.8 28.3 28.9  MCHC 32.2 33.1 33.3 31.6 32.6  RDW 14.0 14.0 14.1 14.1 14.0  LYMPHSABS 1.7 1.9 1.9 1.4 1.6  MONOABS 0.8 1.0 0.9 0.8 1.1*  EOSABS 0.1 0.1 0.1 0.2 0.1  BASOSABS 0.0 0.1 0.1 0.0 0.0    Recent Labs  Lab 02/21/23 1254 02/21/23 1304 02/22/23 2128 02/23/23 0038 02/23/23 0452 02/23/23 1053 02/23/23 1059 02/24/23 0737 02/25/23 0324 02/26/23 0340 02/26/23 0554 02/27/23 0553 02/28/23 0350  NA 138   < >  --  140  --   --    --  135 137  --  140 139 138  K 4.3   < >  --  3.4*  --   --   --  3.6 3.5  --  3.7 3.5 3.4*  CL 102   < >  --  103  --   --   --  105 105  --  107 107 104  CO2 30   < >  --  25  --   --   --  23 22  --  24 24 25   ANIONGAP 6   < >  --  12  --   --   --  7 10  --  9 8 9   GLUCOSE 101*   < >  --  122*  --   --   --  98 94  --  87 83 95  BUN 15   < >  --  14  --   --   --  10 11  --  10 11 8   CREATININE 1.17   < > 1.23 1.33*  --   --   --  1.21 1.10  --  1.24 0.97 0.93  AST 21   < >  --   --   --   --   --  30 34  --  32 22 24  ALT 17   < >  --   --   --   --   --  16 17  --  14 15 15   ALKPHOS 34*   < >  --   --   --   --   --  29* 33*  --  27* 27* 31*  BILITOT 0.6   < >  --   --   --   --   --  0.6 0.8  --  0.4 0.8 0.7  ALBUMIN 3.5   < >  --   --   --   --   --  2.9* 3.0*  --  2.5* 2.3* 2.7*  CRP  --   --   --  0.5  --   --   --  0.8 2.3* 4.3*  --  3.3*  --   PROCALCITON  --   --   --   --   --    < >  --  <0.10 <0.10  --  <0.10 <0.10 <0.10  INR 1.0  --   --   --   --   --   --   --   --   --   --   --   --   TSH  --   --  3.180  --   --   --   --   --   --   --   --   --   --   HGBA1C  --   --   --   --   --   --  5.5  --   --   --   --   --   --   AMMONIA  --   --   --   --  12  --   --   --   --   --   --   --   --   BNP  --   --   --   --   --   --   --  408.6* 460.0*  --  965.2* 470.3*  --   MG  --   --   --   --   --   --   --  1.7 1.9  --  1.9 1.9  --   CALCIUM 11.6*   < >  --  10.8*  --   --   --  9.4 9.0  --  8.6* 8.4* 8.8*   < > = values in this interval not displayed.      Recent Labs  Lab 02/21/23 1254 02/22/23 1332 02/22/23 2128 02/23/23 0038 02/23/23 0452 02/23/23 1053 02/23/23 1059 02/24/23 0737 02/25/23 0324 02/26/23 0340 02/26/23 0554 02/27/23 0553 02/28/23 0350  CRP  --   --   --  0.5  --   --   --  0.8 2.3* 4.3*  --  3.3*  --   PROCALCITON  --   --   --   --   --    < >  --  <0.10 <0.10  --  <0.10 <0.10 <  0.10  INR 1.0  --   --   --   --   --   --   --   --    --   --   --   --   TSH  --   --  3.180  --   --   --   --   --   --   --   --   --   --   HGBA1C  --   --   --   --   --   --  5.5  --   --   --   --   --   --   AMMONIA  --   --   --   --  12  --   --   --   --   --   --   --   --   BNP  --   --   --   --   --   --   --  408.6* 460.0*  --  965.2* 470.3*  --   MG  --   --   --   --   --   --   --  1.7 1.9  --  1.9 1.9  --   CALCIUM 11.6*   < >  --  10.8*  --   --   --  9.4 9.0  --  8.6* 8.4* 8.8*   < > = values in this interval not displayed.    --------------------------------------------------------------------------------------------------------------- Lab Results  Component Value Date   CHOL 152 02/24/2023   HDL 28 (L) 02/24/2023   LDLCALC 105 (H) 02/24/2023   TRIG 94 02/24/2023   CHOLHDL 5.4 02/24/2023    Lab Results  Component Value Date   HGBA1C 5.5 02/23/2023   No results for input(s): "TSH", "T4TOTAL", "FREET4", "T3FREE", "THYROIDAB" in the last 72 hours.  No results for input(s): "VITAMINB12", "FOLATE", "FERRITIN", "TIBC", "IRON", "RETICCTPCT" in the last 72 hours.  ------------------------------------------------------------------------------------------------------------------ Cardiac Enzymes No results for input(s): "CKMB", "TROPONINI", "MYOGLOBIN" in the last 168 hours.  Invalid input(s): "CK"  Micro Results Recent Results (from the past 240 hour(s))  MRSA Next Gen by PCR, Nasal     Status: None   Collection Time: 02/23/23  3:31 AM   Specimen: Nasal Mucosa; Nasal Swab  Result Value Ref Range Status   MRSA by PCR Next Gen NOT DETECTED NOT DETECTED Final    Comment: (NOTE) The GeneXpert MRSA Assay (FDA approved for NASAL specimens only), is one component of a comprehensive MRSA colonization surveillance program. It is not intended to diagnose MRSA infection nor to guide or monitor treatment for MRSA infections. Test performance is not FDA approved in patients less than 54 years old. Performed at [facility], [address] [place], [place] [address]   CSF culture w Gram Stain     Status: None (Preliminary result)   Collection Time: 02/24/23  2:18 PM   Specimen: CSF; Cerebrospinal Fluid  Result Value Ref Range Status   Specimen Description CSF  Final   Special Requests NONE  Final   Gram Stain   Final    WBC PRESENT,BOTH PMN AND MONONUCLEAR NO ORGANISMS SEEN CYTOSPIN SMEAR    Culture   Final    NO GROWTH < 24 HOURS Performed at [facility], [address], [place], [place] [address]    Report Status PENDING  Incomplete    Radiology  Reports DG Chest Port 1 View  Result Date: 02/27/2023 CLINICAL DATA:  77 year old male with history of shortness of breath. EXAM: PORTABLE CHEST 1 VIEW COMPARISON:  Chest x-ray 12/25/2022. FINDINGS: Lung volumes are normal. Opacity in the left base partially obscuring the lateral left hemidiaphragm. Ill-defined opacity also noted in the apex of the right lung (although this could potentially simply reflect prominent first costochondral junction). No pleural effusions. No pneumothorax. No evidence of pulmonary edema. Heart size is borderline enlarged. Upper mediastinal contours are within normal limits. Atherosclerotic calcifications are noted in the thoracic aorta. IMPRESSION: 1. Opacities in the right upper lobe near the apex and in the periphery of the left lung base. These are new compared to the recent prior study from 12/25/2022, concerning for developing areas of airspace consolidation, although an underlying mass in the right upper lobe is not excluded. Followup PA and lateral chest X-ray is recommended in 3-4 weeks following trial of antibiotic therapy to ensure resolution and exclude underlying malignancy. Alternatively, if there is strong clinical concern for malignancy, further evaluation with contrast-enhanced chest CT could be considered at this time. 2. Aortic atherosclerosis. Electronically Signed   By: Trudie Reed M.D.   On:  02/27/2023 07:02   DG FL GUIDED LUMBAR PUNCTURE  Result Date: 02/26/2023 CLINICAL DATA:  77 year old male with complaint of confusion, altered mental status. Request for lumbar puncture. EXAM: LUMBAR PUNCTURE UNDER FLUOROSCOPY PROCEDURE: An appropriate skin entry site was determined fluoroscopically. Operator donned sterile gloves and mask. Skin site was marked, then prepped with Betadine, draped in usual sterile fashion, and infiltrated locally with 1% lidocaine. A 20 gauge spinal needle advanced into the thecal sac at L2-L3 from a right interlaminar approach. Clear colorless CSF spontaneously returned, with opening pressure of 20 cm water. 5 ml CSF were collected and divided among 4 sterile vials for the requested laboratory studies. The needle was then removed. The patient restless, not following commands during procedure, but ultimately tolerated the procedure well and there were no complications. FLUOROSCOPY: Radiation Exposure Index (as provided by the fluoroscopic device): 2.6 mGy Kerma IMPRESSION: Technically successful lumbar puncture under fluoroscopy. This exam was performed by Loyce Dys PA-C, and was supervised and interpreted by Ruel Favors, MD. Electronically Signed   By: Judie Petit.  Shick M.D.   On: 02/26/2023 14:48   Overnight EEG with video  Result Date: 02/26/2023 Charlsie Quest, MD     02/26/2023  9:13 AM Patient Name: Dennis [name] MRN: [id number] Epilepsy Attending: Charlsie Quest Referring Physician/Provider: Marjorie Smolder, NP Duration: 02/25/2023 1353 to 02/26/2023 0900 Patient history: 77yo M with ams getting eeg to evaluate for seizure Level of alertness: lethargic , asleep AEDs during EEG study: None Technical aspects: This EEG study was done with scalp electrodes positioned according to the 10-20 International system of electrode placement. Electrical activity was reviewed with band pass filter of 1-70Hz , sensitivity of 7 uV/mm, display speed of 25mm/sec with a 60Hz  notched  filter applied as appropriate. EEG data were recorded continuously and digitally stored.  Video monitoring was available and reviewed as appropriate. Description: No clear posterior dominant rhythm was seen.  Sleep was characterized by sleep symptoms (12 to 14 Hz), maximal frontocentral region.  EEG showed continuous generalized 3 to 5 Hz theta-delta slowing. Hyperventilation and photic stimulation were not performed.   ABNORMALITY - Continuous slow, generalized IMPRESSION: 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 No seizures or epileptiform discharges were seen throughout the recording. Priyanka  Annabelle Harman   ECHOCARDIOGRAM COMPLETE  Result Date: 02/24/2023    ECHOCARDIOGRAM REPORT   Patient Name:   Dennis [name] Date of Exam: 02/24/2023 Medical Rec #:  [id number]     Height:       64.0 in Accession #:    [id number]    Weight:       149.9 lb Date of Birth:  1945-08-21     BSA:          1.731 m Patient Age:    46 years      BP:           118/94 mmHg Patient Gender: M             HR:           88 bpm. Exam Location:  Inpatient Procedure: 2D Echo, Color Doppler and Cardiac Doppler Indications:    Abnormal ECG  History:        Patient has no prior history of Echocardiogram examinations.                 Signs/Symptoms:Altered Mental Status; Risk Factors:Hypertension.  Sonographer:    Milda Smart Referring Phys: Stanford Scotland [facility]  Sonographer Comments: Image acquisition challenging due to patient body habitus. Pt scanned supine due to restraints. IMPRESSIONS  1. Left ventricular ejection fraction, by estimation, is 45 to 50%. Left ventricular ejection fraction by 2D MOD biplane is 49.5 %. The left ventricle has mildly decreased function. The left ventricle demonstrates regional wall motion abnormalities (see  scoring diagram/findings for description). There is mild left ventricular hypertrophy. Left ventricular diastolic function could not be evaluated.  2. Right ventricular systolic function is  normal. The right ventricular size is normal.  3. The mitral valve is normal in structure. No evidence of mitral valve regurgitation. No evidence of mitral stenosis.  4. The aortic valve is normal in structure. There is moderate calcification of the aortic valve. There is moderate thickening of the aortic valve. Aortic valve regurgitation is trivial. No aortic stenosis is present.  5. The inferior vena cava is normal in size with greater than 50% respiratory variability, suggesting right atrial pressure of 3 mmHg. FINDINGS  Left Ventricle: Left ventricular ejection fraction, by estimation, is 45 to 50%. Left ventricular ejection fraction by 2D MOD biplane is 49.5 %. The left ventricle has mildly decreased function. The left ventricle demonstrates regional wall motion abnormalities. The left ventricular internal cavity size was normal in size. There is mild left ventricular hypertrophy. Left ventricular diastolic function could not be evaluated.  LV Wall Scoring: The entire anterior wall and entire septum are hypokinetic. The entire lateral wall, entire inferior wall, and apex are normal. Right Ventricle: The right ventricular size is normal. No increase in right ventricular wall thickness. Right ventricular systolic function is normal. Left Atrium: Left atrial size was normal in size. Right Atrium: Right atrial size was normal in size. Pericardium: There is no evidence of pericardial effusion. Mitral Valve: The mitral valve is normal in structure. No evidence of mitral valve regurgitation. No evidence of mitral valve stenosis. Tricuspid Valve: The tricuspid valve is normal in structure. Tricuspid valve regurgitation is not demonstrated. No evidence of tricuspid stenosis. Aortic Valve: The aortic valve is normal in structure. There is moderate calcification of the aortic valve. There is moderate thickening of the aortic valve. Aortic valve regurgitation is trivial. No aortic stenosis is present. Pulmonic Valve: The  pulmonic valve was normal in structure. Pulmonic valve regurgitation is  not visualized. No evidence of pulmonic stenosis. Aorta: The aortic root is normal in size and structure. Venous: The inferior vena cava is normal in size with greater than 50% respiratory variability, suggesting right atrial pressure of 3 mmHg. IAS/Shunts: No atrial level shunt detected by color flow Doppler.  LEFT VENTRICLE PLAX 2D                        Biplane EF (MOD) LVIDd:         4.10 cm         LV Biplane EF:   Left LVIDs:         3.20 cm                          ventricular LV PW:         1.00 cm                          ejection LV IVS:        1.20 cm                          fraction by LVOT diam:     2.40 cm                          2D MOD LVOT Area:     4.52 cm                         biplane is                                                 49.5 %.  LV Volumes (MOD)               Diastology LV vol d, MOD    39.7 ml       LV e' medial:  5.66 cm/s A2C:                           LV e' lateral: 6.53 cm/s LV vol d, MOD    51.4 ml A4C: LV vol s, MOD    22.3 ml A2C: LV vol s, MOD    21.5 ml A4C: LV SV MOD A2C:   17.4 ml LV SV MOD A4C:   51.4 ml LV SV MOD BP:    22.7 ml RIGHT VENTRICLE RV S prime:     14.90 cm/s TAPSE (M-mode): 1.8 cm LEFT ATRIUM           Index        RIGHT ATRIUM          Index LA diam:      4.30 cm 2.48 cm/m   RA Area:     6.91 cm LA Vol (A2C): 20.7 ml 11.96 ml/m  RA Volume:   10.10 ml 5.84 ml/m LA Vol (A4C): 26.2 ml 15.14 ml/m   AORTA Ao Root diam: 3.50 cm  SHUNTS Systemic Diam: 2.40 cm Chilton Si MD Electronically signed by Chilton Si MD Signature Date/Time: 02/24/2023/4:45:53 PM    Final       Signature  -   Ihor Stall.D  on 02/28/2023 at 7:44 AM   -  To page go to [email or web address]

## 2023-02-28 NOTE — TOC Progression Note (Signed)
Transition of Care [facility]) - Progression Note    Patient Details  Name: Dennis [name] MRN: [id number] Date of Birth: [birth year]  Transition of Care [facility]) CM/SW Contact  Mearl Latin, LCSW Phone Number: 02/28/2023, 11:44 AM  Clinical Narrative:    CSW spoke with patient's neighbor, [name], as she is helping the patient's significant other since she does not have a phone. CSW texted her the SNF bed offers and is requesting [facility] review patient as significant other would like for him to remain in [facility]. They will come visit him Monday.      Expected Discharge Plan: Skilled Nursing Facility Barriers to Discharge: Continued Medical Work up, Requiring sitter/restraints, SNF Pending bed offer, Unsafe home situation  Expected Discharge Plan and Services In-house Referral: Clinical Social Work   Post Acute Care Choice: Skilled Nursing Facility Living arrangements for the past 2 months: Single Family Home                                       Social Determinants of Health (SDOH) Interventions SDOH Screenings   Food Insecurity: No Food Insecurity (07/26/2020)   Received from [facility], [facility]  Transportation Needs: No Transportation Needs (04/04/2022)   Received from [facility], [facility]  Tobacco Use: Medium Risk (02/22/2023)    Readmission Risk Interventions     No data to display

## 2023-02-28 NOTE — Progress Notes (Signed)
Speech Language Pathology Treatment: Dysphagia  Patient Details Name: Dennis [name] MRN: [id number] DOB: 1945-11-08 Today's Date: 02/28/2023 Time: [id number] SLP Time Calculation (min) (ACUTE ONLY): 11 min  Assessment / Plan / Recommendation Clinical Impression  Pt appears more alert this date, although remains confused. Observed pt with trials from Dys 2 meal tray as well as thin liquids and Dys 3 solids with prolonged bolus formation and delayed throat clearance and coughing throughout the session. Question correlation of coughing to swallowing as pt coughed well after PO trials concluded. With more solid trials, pt appeared to demonstrate increased WOB and oral residue. Given potential for fluctuating mentation, recommend continuing Dys 2 diet with thin liquids for now. Will continue to follow to assess readiness to upgrade as clinically indicated.    HPI HPI: Dennis [name] is a 77 yo male presenting to ED 10/5 with AMS. CTH negative. MRI Brain pending. PMH includes HLD, BPH, HTN, rheumatoid arthritis, COPD, Barrett's esophagus, OSA, prior CVA; ST saw for BSE on 10/6 with impaired mentation impacting swallow function.  ST f/u for PO trials/swallow re-assessment.      SLP Plan  Continue with current plan of care      Recommendations for follow up therapy are one component of a multi-disciplinary discharge planning process, led by the attending physician.  Recommendations may be updated based on patient status, additional functional criteria and insurance authorization.    Recommendations  Diet recommendations: Dysphagia 2 (fine chop);Thin liquid Liquids provided via: Cup;Straw Medication Administration: Whole meds with puree Supervision: Full supervision/cueing for compensatory strategies;Trained caregiver to feed patient Compensations: Slow rate;Small sips/bites;Minimize environmental distractions Postural Changes and/or Swallow Maneuvers: Seated upright 90 degrees;Upright 30-60 min  after meal                  Oral care BID;Staff/trained caregiver to provide oral care   Frequent or constant Supervision/Assistance Dysphagia, unspecified (R13.10)     Continue with current plan of care     Gwynneth Aliment, M.A., CF-SLP Speech Language Pathology, Acute Rehabilitation Services  Secure Chat preferred [phone]   02/28/2023, 10:46 AM

## 2023-02-28 NOTE — Progress Notes (Signed)
  Patient's nurse reported that patient's blood pressure elevated. Patient was given hydralazine 1 AM however blood pressure still remains upper 170s range. - Ordered labetalol 10 mg IV once.  Tereasa Coop, MD [facility] 02/28/2023, 2:18 AM

## 2023-03-01 ENCOUNTER — Inpatient Hospital Stay (HOSPITAL_COMMUNITY): Payer: 59

## 2023-03-01 DIAGNOSIS — R410 Disorientation, unspecified: Secondary | ICD-10-CM | POA: Diagnosis not present

## 2023-03-01 LAB — CBC WITH DIFFERENTIAL/PLATELET
Abs Immature Granulocytes: 0.05 10*3/uL (ref 0.00–0.07)
Basophils Absolute: 0 10*3/uL (ref 0.0–0.1)
Basophils Relative: 0 %
Eosinophils Absolute: 0.1 10*3/uL (ref 0.0–0.5)
Eosinophils Relative: 2 %
HCT: 35.4 % — ABNORMAL LOW (ref 39.0–52.0)
Hemoglobin: 11.5 g/dL — ABNORMAL LOW (ref 13.0–17.0)
Immature Granulocytes: 1 %
Lymphocytes Relative: 22 %
Lymphs Abs: 2 10*3/uL (ref 0.7–4.0)
MCH: 28.7 pg (ref 26.0–34.0)
MCHC: 32.5 g/dL (ref 30.0–36.0)
MCV: 88.3 fL (ref 80.0–100.0)
Monocytes Absolute: 1.1 10*3/uL — ABNORMAL HIGH (ref 0.1–1.0)
Monocytes Relative: 13 %
Neutro Abs: 5.6 10*3/uL (ref 1.7–7.7)
Neutrophils Relative %: 62 %
Platelets: 219 10*3/uL (ref 150–400)
RBC: 4.01 MIL/uL — ABNORMAL LOW (ref 4.22–5.81)
RDW: 14.3 % (ref 11.5–15.5)
WBC: 8.9 10*3/uL (ref 4.0–10.5)
nRBC: 0 % (ref 0.0–0.2)

## 2023-03-01 LAB — BASIC METABOLIC PANEL
Anion gap: 7 (ref 5–15)
BUN: 8 mg/dL (ref 8–23)
CO2: 23 mmol/L (ref 22–32)
Calcium: 8.9 mg/dL (ref 8.9–10.3)
Chloride: 108 mmol/L (ref 98–111)
Creatinine, Ser: 0.86 mg/dL (ref 0.61–1.24)
GFR, Estimated: >60 mL/min (ref >60)
Glucose, Bld: 88 mg/dL (ref 70–99)
Potassium: 3.9 mmol/L (ref 3.5–5.1)
Sodium: 138 mmol/L (ref 135–145)

## 2023-03-01 LAB — MAGNESIUM: Magnesium: 1.8 mg/dL (ref 1.7–2.4)

## 2023-03-01 LAB — CSF CULTURE W GRAM STAIN: Culture: NO GROWTH

## 2023-03-01 LAB — PHOSPHORUS: Phosphorus: 2.5 mg/dL (ref 2.5–4.6)

## 2023-03-01 LAB — BRAIN NATRIURETIC PEPTIDE: B Natriuretic Peptide: 1535.3 pg/mL — ABNORMAL HIGH (ref 0.0–100.0)

## 2023-03-01 MED ORDER — HYDRALAZINE HCL 50 MG PO TABS
50.0000 mg | ORAL_TABLET | Freq: Three times a day (TID) | ORAL | Status: DC
  Administered 2023-03-01: 50 mg via ORAL
  Filled 2023-03-01: qty 1

## 2023-03-01 MED ORDER — ISOSORBIDE MONONITRATE ER 30 MG PO TB24
30.0000 mg | ORAL_TABLET | Freq: Every day | ORAL | Status: DC
  Administered 2023-03-01 – 2023-03-16 (×16): 30 mg via ORAL
  Filled 2023-03-01 (×17): qty 1

## 2023-03-01 MED ORDER — CARVEDILOL 3.125 MG PO TABS
3.1250 mg | ORAL_TABLET | Freq: Two times a day (BID) | ORAL | Status: DC
  Administered 2023-03-02 – 2023-03-16 (×26): 3.125 mg via ORAL
  Filled 2023-03-01 (×34): qty 1

## 2023-03-01 MED ORDER — LACTATED RINGERS IV BOLUS
500.0000 mL | Freq: Once | INTRAVENOUS | Status: AC
  Administered 2023-03-01: 500 mL via INTRAVENOUS

## 2023-03-01 NOTE — Plan of Care (Signed)
Pt stable on 2L. Agitated, confused and attempting to leave. Soft restraints reapplied during shift. Voiding regularly.    Problem: Education: Goal: Knowledge of General Education information will improve Description: Including pain rating scale, medication(s)/side effects and non-pharmacologic comfort measures 03/01/2023 0518 by Quenten Raven, RN Outcome: Progressing 03/01/2023 0518 by Quenten Raven, RN Outcome: Progressing   Problem: Health Behavior/Discharge Planning: Goal: Ability to manage health-related needs will improve 03/01/2023 0518 by Quenten Raven, RN Outcome: Progressing 03/01/2023 0518 by Quenten Raven, RN Outcome: Progressing   Problem: Clinical Measurements: Goal: Ability to maintain clinical measurements within normal limits will improve 03/01/2023 0518 by Quenten Raven, RN Outcome: Progressing 03/01/2023 0518 by Quenten Raven, RN Outcome: Progressing Goal: Will remain free from infection 03/01/2023 0518 by Quenten Raven, RN Outcome: Progressing 03/01/2023 0518 by Quenten Raven, RN Outcome: Progressing Goal: Diagnostic test results will improve 03/01/2023 0518 by Quenten Raven, RN Outcome: Progressing 03/01/2023 0518 by Quenten Raven, RN Outcome: Progressing Goal: Respiratory complications will improve 03/01/2023 0518 by Quenten Raven, RN Outcome: Progressing 03/01/2023 0518 by Quenten Raven, RN Outcome: Progressing Goal: Cardiovascular complication will be avoided 03/01/2023 0518 by Quenten Raven, RN Outcome: Progressing 03/01/2023 0518 by Quenten Raven, RN Outcome: Progressing   Problem: Activity: Goal: Risk for activity intolerance will decrease 03/01/2023 0518 by Quenten Raven, RN Outcome: Progressing 03/01/2023 0518 by Quenten Raven, RN Outcome: Progressing   Problem: Nutrition: Goal: Adequate nutrition will be maintained 03/01/2023 0518 by Quenten Raven,  RN Outcome: Progressing 03/01/2023 0518 by Quenten Raven, RN Outcome: Progressing   Problem: Coping: Goal: Level of anxiety will decrease 03/01/2023 0518 by Quenten Raven, RN Outcome: Progressing 03/01/2023 0518 by Quenten Raven, RN Outcome: Progressing   Problem: Elimination: Goal: Will not experience complications related to bowel motility 03/01/2023 0518 by Quenten Raven, RN Outcome: Progressing 03/01/2023 0518 by Quenten Raven, RN Outcome: Progressing Goal: Will not experience complications related to urinary retention 03/01/2023 0518 by Quenten Raven, RN Outcome: Progressing 03/01/2023 0518 by Quenten Raven, RN Outcome: Progressing   Problem: Pain Managment: Goal: General experience of comfort will improve 03/01/2023 0518 by Quenten Raven, RN Outcome: Progressing 03/01/2023 0518 by Quenten Raven, RN Outcome: Progressing   Problem: Safety: Goal: Ability to remain free from injury will improve 03/01/2023 0518 by Quenten Raven, RN Outcome: Progressing 03/01/2023 0518 by Quenten Raven, RN Outcome: Progressing   Problem: Skin Integrity: Goal: Risk for impaired skin integrity will decrease 03/01/2023 0518 by Quenten Raven, RN Outcome: Progressing 03/01/2023 0518 by Quenten Raven, RN Outcome: Progressing   Problem: Safety: Goal: Non-violent Restraint(s) Outcome: Progressing

## 2023-03-01 NOTE — Progress Notes (Signed)
PROGRESS NOTE                                                                                                                                                                                                             Patient Demographics:    Dennis [name], is a 77 y.o. male, DOB - 1946/03/16, ZOX:[id number]  Outpatient Primary MD for the patient is Pcp, No    LOS - 7  Admit date - 02/22/2023    Chief Complaint  Patient presents with   Altered Mental Status       Brief Narrative (HPI from H&P)   77 year old male with past medical history of HLD, BPH, HTN, rheumatoid arthritis, COPD, Barrett's esophagus, OSA does not use CPAP, history of CVA. Patient sent in for altered mentation. At baseline patient has a ataxic gait but is not confused. Over the course of the past week he has become increasingly confused. He was brought to the ER.  In the ER he was trying to escape from the ER room had to be IVC need placed with 1 is to 1 sitter, had 2 CT scans of the head which were unremarkable, he was admitted for further workup of encephalopathy.   Subjective:   Patient in bed awake but confused, denies any headache or chest pain   Assessment  & Plan :   Gradually progressive metabolic encephalopathy over the course of 5 to 7 days.  Brought in by wife for worsening confusion, confusion persisted in the ER, CT head x 2 several hours apart is nonacute, no focal deficits apparent. He was afebrile, no leukocytosis, CRP and ESR are stable, head CT x 2 stable, ABG stable, RPR stable, ammonia stable, stable B12, TSH, A1c, MRI and EEG x2, LP done after much effort by IR under fluoroscopy on 02/26/2023 rules out bacterial meningitis, CSF findings discussed with neurology and ID physician Dr. Algis Liming, not consistent with any meningitis encephalitis all antibiotics and acyclovir stopped 02/27/2023.  Talking to the neighbors it has become clear that  patient has had gradual downward decline which sounds like dementia for several months, unfortunately wife is quite demented as well, Adult Protective Services has been involved in the past and has been involved again now.  He has a very poor baseline which has been gradually declining over several months due to underlying undiagnosed  dementia.  Will require placement, looking for a bed medically stable.   Dehydration, reduced urine output, AKI.  Poor oral intake likely due to confusion as above, hold ARB, hydrate with IV fluids.  Bladder scan has 53 cc of urine.  Continue to monitor bladder scan intermittently.  History of rheumatoid arthritis.  Post discharge resume methotrexate and sulfasalazine.  Stable CRP ESR.  BPH.  Proscar and Flomax if he can tolerate oral medications.  Hypertension.  On Coreg dose adjusted, oral hydralazine and Imdur added as well along with as needed IV hydralazine for better control  Dyslipidemia.  Check a.m. lipid panel.  Hypokalemia.  Replaced  Nonspecific EKG changes.  Denies any chest pain.  Coreg and statin, noted with EF slightly depressed and some wall motion abnormalities, have to hold antiplatelets due to pending LP.  Troponin trend was stable and in non-ACS pattern, outpatient cardiology follow-up not a candidate for any invasive procedure or testing due to underlying advanced dementia and poor functional status.  Will optimize medical care.  Continue Coreg, statin, and home dose Plavix.       Condition - Extremely Guarded  Family Communication  : Called wife Dennis [name] [phone] twice on 02/23/2023 at 8 AM and 10 AM, no response, message left on answering machine on her cell phone.  Wife informs me that her cell phone is broken, discussed with her in detail in room on 02/23/2023.  [name] (Neighbor) [phone] (Mobile) was updated message left morning of 02/24/2023 at 10:04 AM, called again afternoon of 02/24/2023, call 02/25/2023 at 10:22 AM message  left, updated neighbor on 02/26/2023, 02/27/2023  Called on 02/25/2023 [phone]  at 11 AM over 20 rings, no response   Code Status :  Full  Consults  :  Neuro  PUD Prophylaxis :  PPI   Procedures  :     LP - CSF not consistent with any infection  Echocardiogram.  1. Left ventricular ejection fraction, by estimation, is 45 to 50%. Left ventricular ejection fraction by 2D MOD biplane is 49.5 %. The left ventricle has mildly decreased function. The left ventricle demonstrates regional wall motion abnormalities (see  scoring diagram/findings for description). There is mild left ventricular hypertrophy. Left ventricular diastolic function could not be evaluated.  2. Right ventricular systolic function is normal. The right ventricular size is normal.  3. The mitral valve is normal in structure. No evidence of mitral valve regurgitation. No evidence of mitral stenosis.  4. The aortic valve is normal in structure. There is moderate calcification of the aortic valve. There is moderate thickening of the aortic valve. Aortic valve regurgitation is trivial. No aortic stenosis is present.  5. The inferior vena cava is normal in size with greater than 50% respiratory variability, suggesting right atrial pressure of 3 mmHg.   CT head x 2.  Nonacute.    MRI brain.  1. No acute intracranial abnormality. 2. Moderate generalized atrophy and white matter disease likely reflects the sequela of chronic microvascular ischemia. 3. Remote lacunar infarct of the right thalamus. 4. Bilateral mastoid effusions. No obstructing nasopharyngeal lesion is present  EEG.  No active seizures      Disposition Plan  :    Status is: Inpatient  DVT Prophylaxis  :    Place and maintain sequential compression device Start: 02/23/23 1015    Lab Results  Component Value Date   PLT 219 03/01/2023    Diet :  Diet Order  DIET DYS 2 Room service appropriate? No; Fluid consistency: Thin  Diet effective now                     Inpatient Medications  Scheduled Meds:  atorvastatin  40 mg Oral Daily   carvedilol  6.25 mg Oral BID WC   clopidogrel  75 mg Oral Daily   finasteride  5 mg Oral Daily   folic acid  1 mg Oral Daily   hydrALAZINE  50 mg Oral Q8H   isosorbide mononitrate  30 mg Oral Daily   pantoprazole  40 mg Oral Daily   tamsulosin  0.4 mg Oral QPC supper   Continuous Infusions:   PRN Meds:.acetaminophen **OR** acetaminophen, haloperidol lactate, hydrALAZINE, ondansetron **OR** ondansetron (ZOFRAN) IV, senna-docusate     Objective:   Vitals:   03/01/23 0500 03/01/23 0525 03/01/23 0600 03/01/23 0655  BP:  (!) 166/78  (!) 162/67  Pulse: 96 80 75 86  Resp: 17 17 16 17   Temp:      TempSrc:      SpO2: 99% 95% 96% 97%  Weight:      Height:        Wt Readings from Last 3 Encounters:  02/22/23 68 kg  02/21/23 68 kg  12/25/22 68 kg     Intake/Output Summary (Last 24 hours) at 03/01/2023 0747 Last data filed at 03/01/2023 0400 Gross per 24 hour  Intake 400 ml  Output 1350 ml  Net -950 ml      Physical Exam  In bed awake but remains confused, supple neck no focal deficits [place].AT,PERRAL Supple Neck, No JVD,   Symmetrical Chest wall movement, Good air movement bilaterally, CTAB RRR,No Gallops,Rubs or new Murmurs,  +ve B.Sounds, Abd Soft, No tenderness,   No Cyanosis, Clubbing or edema        Data Review:    Recent Labs  Lab 02/25/23 0324 02/26/23 0554 02/27/23 0553 02/28/23 0350 03/01/23 0245  WBC 10.0 8.6 8.0 10.1 8.9  HGB 13.3 11.7* 11.0* 12.9* 11.5*  HCT 40.2 35.1* 34.8* 39.6 35.4*  PLT 239 224 211 252 219  MCV 90.3 89.3 89.5 88.6 88.3  MCH 29.9 29.8 28.3 28.9 28.7  MCHC 33.1 33.3 31.6 32.6 32.5  RDW 14.0 14.1 14.1 14.0 14.3  LYMPHSABS 1.9 1.9 1.4 1.6 2.0  MONOABS 1.0 0.9 0.8 1.1* 1.1*  EOSABS 0.1 0.1 0.2 0.1 0.1  BASOSABS 0.1 0.1 0.0 0.0 0.0    Recent Labs  Lab 02/22/23 2128 02/23/23 0038 02/23/23 0452 02/23/23 1053  02/23/23 1059 02/24/23 0737 02/25/23 0324 02/26/23 0340 02/26/23 0554 02/27/23 0553 02/28/23 0350 03/01/23 0245  NA  --  140  --   --   --  135 137  --  140 139 138 138  K  --  3.4*  --   --   --  3.6 3.5  --  3.7 3.5 3.4* 3.9  CL  --  103  --   --   --  105 105  --  107 107 104 108  CO2  --  25  --   --   --  23 22  --  24 24 25 23   ANIONGAP  --  12  --   --   --  7 10  --  9 8 9 7   GLUCOSE  --  122*  --   --   --  98 94  --  87 83 95 88  BUN  --  14  --   --   --  10 11  --  10 11 8 8   CREATININE 1.23 1.33*  --   --   --  1.21 1.10  --  1.24 0.97 0.93 0.86  AST  --   --   --   --   --  30 34  --  32 22 24  --   ALT  --   --   --   --   --  16 17  --  14 15 15   --   ALKPHOS  --   --   --   --   --  29* 33*  --  27* 27* 31*  --   BILITOT  --   --   --   --   --  0.6 0.8  --  0.4 0.8 0.7  --   ALBUMIN  --   --   --   --   --  2.9* 3.0*  --  2.5* 2.3* 2.7*  --   CRP  --  0.5  --   --   --  0.8 2.3* 4.3*  --  3.3*  --   --   PROCALCITON  --   --   --    < >  --  <0.10 <0.10  --  <0.10 <0.10 <0.10  --   TSH 3.180  --   --   --   --   --   --   --   --   --   --   --   HGBA1C  --   --   --   --  5.5  --   --   --   --   --   --   --   AMMONIA  --   --  12  --   --   --   --   --   --   --   --   --   BNP  --   --   --   --   --  408.6* 460.0*  --  965.2* 470.3*  --  1,535.3*  MG  --   --   --   --   --  1.7 1.9  --  1.9 1.9  --  1.8  CALCIUM  --  10.8*  --   --   --  9.4 9.0  --  8.6* 8.4* 8.8* 8.9   < > = values in this interval not displayed.      Recent Labs  Lab 02/22/23 2128 02/23/23 0038 02/23/23 0452 02/23/23 1053 02/23/23 1059 02/24/23 0737 02/25/23 0324 02/26/23 0340 02/26/23 0554 02/27/23 0553 02/28/23 0350 03/01/23 0245  CRP  --  0.5  --   --   --  0.8 2.3* 4.3*  --  3.3*  --   --   PROCALCITON  --   --   --    < >  --  <0.10 <0.10  --  <0.10 <0.10 <0.10  --   TSH 3.180  --   --   --   --   --   --   --   --   --   --   --   HGBA1C  --   --   --   --  5.5  --    --   --   --   --   --   --  AMMONIA  --   --  12  --   --   --   --   --   --   --   --   --   BNP  --   --   --   --   --  408.6* 460.0*  --  965.2* 470.3*  --  1,535.3*  MG  --   --   --   --   --  1.7 1.9  --  1.9 1.9  --  1.8  CALCIUM  --  10.8*  --   --   --  9.4 9.0  --  8.6* 8.4* 8.8* 8.9   < > = values in this interval not displayed.    --------------------------------------------------------------------------------------------------------------- Lab Results  Component Value Date   CHOL 152 02/24/2023   HDL 28 (L) 02/24/2023   LDLCALC 105 (H) 02/24/2023   TRIG 94 02/24/2023   CHOLHDL 5.4 02/24/2023    Lab Results  Component Value Date   HGBA1C 5.5 02/23/2023      Micro Results Recent Results (from the past 240 hour(s))  MRSA Next Gen by PCR, Nasal     Status: None   Collection Time: 02/23/23  3:31 AM   Specimen: Nasal Mucosa; Nasal Swab  Result Value Ref Range Status   MRSA by PCR Next Gen NOT DETECTED NOT DETECTED Final    Comment: (NOTE) The GeneXpert MRSA Assay (FDA approved for NASAL specimens only), is one component of a comprehensive MRSA colonization surveillance program. It is not intended to diagnose MRSA infection nor to guide or monitor treatment for MRSA infections. Test performance is not FDA approved in patients less than 24 years old. Performed at [facility], [address], [place], [place] [address]   CSF culture w Gram Stain     Status: None (Preliminary result)   Collection Time: 02/24/23  2:18 PM   Specimen: CSF; Cerebrospinal Fluid  Result Value Ref Range Status   Specimen Description CSF  Final   Special Requests NONE  Final   Gram Stain   Final    WBC PRESENT,BOTH PMN AND MONONUCLEAR NO ORGANISMS SEEN CYTOSPIN SMEAR    Culture   Final    NO GROWTH 2 DAYS Performed at [facility], [address], [place], [place] [address]    Report Status PENDING  Incomplete    Radiology Reports DG Chest Port 1 View  Result  Date: 02/27/2023 CLINICAL DATA:  77 year old male with history of shortness of breath. EXAM: PORTABLE CHEST 1 VIEW COMPARISON:  Chest x-ray 12/25/2022. FINDINGS: Lung volumes are normal. Opacity in the left base partially obscuring the lateral left hemidiaphragm. Ill-defined opacity also noted in the apex of the right lung (although this could potentially simply reflect prominent first costochondral junction). No pleural effusions. No pneumothorax. No evidence of pulmonary edema. Heart size is borderline enlarged. Upper mediastinal contours are within normal limits. Atherosclerotic calcifications are noted in the thoracic aorta. IMPRESSION: 1. Opacities in the right upper lobe near the apex and in the periphery of the left lung base. These are new compared to the recent prior study from 12/25/2022, concerning for developing areas of airspace consolidation, although an underlying mass in the right upper lobe is not excluded. Followup PA and lateral chest X-ray is recommended in 3-4 weeks following trial of antibiotic therapy to ensure resolution and exclude underlying malignancy. Alternatively, if there is strong clinical concern for malignancy, further evaluation with contrast-enhanced chest CT  could be considered at this time. 2. Aortic atherosclerosis. Electronically Signed   By: Trudie Reed M.D.   On: 02/27/2023 07:02   DG FL GUIDED LUMBAR PUNCTURE  Result Date: 02/26/2023 CLINICAL DATA:  77 year old male with complaint of confusion, altered mental status. Request for lumbar puncture. EXAM: LUMBAR PUNCTURE UNDER FLUOROSCOPY PROCEDURE: An appropriate skin entry site was determined fluoroscopically. Operator donned sterile gloves and mask. Skin site was marked, then prepped with Betadine, draped in usual sterile fashion, and infiltrated locally with 1% lidocaine. A 20 gauge spinal needle advanced into the thecal sac at L2-L3 from a right interlaminar approach. Clear colorless CSF spontaneously returned,  with opening pressure of 20 cm water. 5 ml CSF were collected and divided among 4 sterile vials for the requested laboratory studies. The needle was then removed. The patient restless, not following commands during procedure, but ultimately tolerated the procedure well and there were no complications. FLUOROSCOPY: Radiation Exposure Index (as provided by the fluoroscopic device): 2.6 mGy Kerma IMPRESSION: Technically successful lumbar puncture under fluoroscopy. This exam was performed by Loyce Dys PA-C, and was supervised and interpreted by Ruel Favors, MD. Electronically Signed   By: Judie Petit.  Shick M.D.   On: 02/26/2023 14:48   Overnight EEG with video  Result Date: 02/26/2023 Charlsie Quest, MD     02/26/2023  9:13 AM Patient Name: DEQUON SCHNEBLY MRN: [id number] Epilepsy Attending: Charlsie Quest Referring Physician/Provider: Marjorie Smolder, NP Duration: 02/25/2023 1353 to 02/26/2023 0900 Patient history: 77yo M with ams getting eeg to evaluate for seizure Level of alertness: lethargic , asleep AEDs during EEG study: None Technical aspects: This EEG study was done with scalp electrodes positioned according to the 10-20 International system of electrode placement. Electrical activity was reviewed with band pass filter of 1-70Hz , sensitivity of 7 uV/mm, display speed of 56mm/sec with a 60Hz  notched filter applied as appropriate. EEG data were recorded continuously and digitally stored.  Video monitoring was available and reviewed as appropriate. Description: No clear posterior dominant rhythm was seen.  Sleep was characterized by sleep symptoms (12 to 14 Hz), maximal frontocentral region.  EEG showed continuous generalized 3 to 5 Hz theta-delta slowing. Hyperventilation and photic stimulation were not performed.   ABNORMALITY - Continuous slow, generalized IMPRESSION: 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 No seizures or epileptiform discharges were seen throughout the recording.  Charlsie Quest      Signature  -   Susa Raring M.D on 03/01/2023 at 7:47 AM   -  To page go to [email or web address]

## 2023-03-01 NOTE — Progress Notes (Signed)
  Delirium: Patient is reporting that patient is agitated, continuously trying to climb out of the bed and pulling his lines.  He was previously on restraint up to 4 points.  Patient received Haldol as needed without any improvement. -Renewing the soft restraint of the wrist and continue bedside sitter - Continue to redirect and reorient patient.  Tereasa Coop, MD [facility] 03/01/2023, 5:00 AM

## 2023-03-02 DIAGNOSIS — R410 Disorientation, unspecified: Secondary | ICD-10-CM | POA: Diagnosis not present

## 2023-03-02 NOTE — Progress Notes (Signed)
Occupational Therapy Treatment Patient Details Name: Dennis [name] MRN: [id number] DOB: April 22, 1946 Today's Date: 03/02/2023   History of present illness 77 yo male presenting to ED 10/5 with AMS. CTH and MRI brain negative; undergoing long-term EEG and lumbar puncture pending. PMH includes HLD, BPH, HTN, rheumatoid arthritis, COPD, Barrett's esophagus, OSA, prior CVA,  dysarthria, LLE weakness, dementia.   OT comments  Pt continuing to show progressing in therapy session, very well mannered and open to OOB mobility. He produces small shuffle steps and needs min A HHA for short distance mobility, unable to progress to longer gait strides. Pt showing capacity to complete grooming routine with setup assist in sitting. He c/o of bilat thigh pain with mobility, unsure of cause but educated pt on LE exercises in the event they are just weak to progress with transfers for functional ADL performance. OT to continue to progress pt as able.       If plan is discharge home, recommend the following:  A lot of help with bathing/dressing/bathroom;Direct supervision/assist for medications management;Direct supervision/assist for financial management;Assist for transportation;Help with stairs or ramp for entrance;Supervision due to cognitive status;A little help with walking and/or transfers   Equipment Recommendations  Other (comment) (defer)    Recommendations for Other Services      Precautions / Restrictions Precautions Precautions: Fall Restrictions Weight Bearing Restrictions: No       Mobility Bed Mobility Overal bed mobility: Needs Assistance Bed Mobility: Supine to Sit     Supine to sit: Min assist, HOB elevated     General bed mobility comments: pt left sitting in recliner    Transfers Overall transfer level: Needs assistance Equipment used: 1 person hand held assist Transfers: Sit to/from Stand Sit to Stand: Min assist           General transfer comment: min A from EOB,  STSx2 CGA from recliner. Difficulty with gait stride, small shuffle steps despite cues and practice of marching in standing     Balance Overall balance assessment: Needs assistance Sitting-balance support: Feet supported Sitting balance-Leahy Scale: Fair     Standing balance support: Single extremity supported, During functional activity Standing balance-Leahy Scale: Poor Standing balance comment: Needs HHA                           ADL either performed or assessed with clinical judgement   ADL   Eating/Feeding: Set up;Sitting   Grooming: Sitting;Wash/dry face;Set up                   Toilet Transfer: Minimal assistance;Stand-pivot;BSC/3in1 (Simulated with recliner)                  Extremity/Trunk Assessment              Vision       Perception     Praxis      Cognition Arousal: Alert Behavior During Therapy: WFL for tasks assessed/performed Overall Cognitive Status: Impaired/Different from baseline Area of Impairment: Attention, Memory, Following commands, Safety/judgement                   Current Attention Level: Sustained Memory: Decreased short-term memory Following Commands: Follows one step commands with increased time Safety/Judgement: Decreased awareness of safety     General Comments: Pt questioning if he is improving, reassured pt that he is.        Exercises General Exercises - Lower Extremity Short Arc Quad: AROM, Seated,  10 reps, Both Hip Flexion/Marching: AROM, Both, 5 reps, Seated    Shoulder Instructions       General Comments Pt BP stable during OOB activity. VSS on 2L    Pertinent Vitals/ Pain       Pain Assessment Pain Assessment: No/denies pain  Home Living                                          Prior Functioning/Environment              Frequency  Min 1X/week        Progress Toward Goals  OT Goals(current goals can now be found in the care plan section)   Progress towards OT goals: Progressing toward goals  Acute Rehab OT Goals Patient Stated Goal: To get better OT Goal Formulation: With patient Time For Goal Achievement: 03/11/23 Potential to Achieve Goals: Good  Plan      Co-evaluation                 AM-PAC OT "6 Clicks" Daily Activity     Outcome Measure   Help from another person eating meals?: A Little Help from another person taking care of personal grooming?: A Little Help from another person toileting, which includes using toliet, bedpan, or urinal?: A Little Help from another person bathing (including washing, rinsing, drying)?: Total Help from another person to put on and taking off regular upper body clothing?: A Lot Help from another person to put on and taking off regular lower body clothing?: Total 6 Click Score: 13    End of Session Equipment Utilized During Treatment: Gait belt  OT Visit Diagnosis: Unsteadiness on feet (R26.81);Other abnormalities of gait and mobility (R26.89);Muscle weakness (generalized) (M62.81)   Activity Tolerance Patient tolerated treatment well   Patient Left in chair;with call bell/phone within reach;with chair alarm set   Nurse Communication Mobility status        Time: 8295-6213 OT Time Calculation (min): 31 min  Charges: OT General Charges $OT Visit: 1 Visit OT Treatments $Therapeutic Activity: 23-37 mins  03/02/2023  AB, OTR/L  Acute Rehabilitation Services  Office: [phone]   Tristan Schroeder 03/02/2023, 11:04 AM

## 2023-03-02 NOTE — Plan of Care (Signed)
Pt stable on 2L. Restraints off. Voiding regularly. No contact from family today.    Problem: Education: Goal: Knowledge of General Education information will improve Description: Including pain rating scale, medication(s)/side effects and non-pharmacologic comfort measures Outcome: Progressing   Problem: Health Behavior/Discharge Planning: Goal: Ability to manage health-related needs will improve Outcome: Progressing   Problem: Clinical Measurements: Goal: Ability to maintain clinical measurements within normal limits will improve Outcome: Progressing Goal: Will remain free from infection Outcome: Progressing Goal: Diagnostic test results will improve Outcome: Progressing Goal: Respiratory complications will improve Outcome: Progressing Goal: Cardiovascular complication will be avoided Outcome: Progressing   Problem: Activity: Goal: Risk for activity intolerance will decrease Outcome: Progressing   Problem: Nutrition: Goal: Adequate nutrition will be maintained Outcome: Progressing   Problem: Coping: Goal: Level of anxiety will decrease Outcome: Progressing   Problem: Elimination: Goal: Will not experience complications related to bowel motility Outcome: Progressing Goal: Will not experience complications related to urinary retention Outcome: Progressing   Problem: Pain Managment: Goal: General experience of comfort will improve Outcome: Progressing   Problem: Safety: Goal: Ability to remain free from injury will improve Outcome: Progressing   Problem: Skin Integrity: Goal: Risk for impaired skin integrity will decrease Outcome: Progressing

## 2023-03-02 NOTE — Progress Notes (Signed)
PROGRESS NOTE                                                                                                                                                                                                             Patient Demographics:    Dennis [name], is a 77 y.o. male, DOB - 1945-09-27, ZOX:[id number]  Outpatient Primary MD for the patient is Pcp, No    LOS - 8  Admit date - 02/22/2023    Chief Complaint  Patient presents with   Altered Mental Status       Brief Narrative (HPI from H&P)   77 year old male with past medical history of HLD, BPH, HTN, rheumatoid arthritis, COPD, Barrett's esophagus, OSA does not use CPAP, history of CVA. Patient sent in for altered mentation. At baseline patient has a ataxic gait but is not confused. Over the course of the past week he has become increasingly confused. He was brought to the ER.  In the ER he was trying to escape from the ER room had to be IVC need placed with 1 is to 1 sitter, had 2 CT scans of the head which were unremarkable, he was admitted for further workup of encephalopathy.   Subjective:   Patient in bed awake but confused, denies any headache or chest pain   Assessment  & Plan :   Gradually progressive metabolic encephalopathy over the course of 5 to 7 days.  Brought in by wife for worsening confusion, confusion persisted in the ER, CT head x 2 several hours apart is nonacute, no focal deficits apparent. He was afebrile, no leukocytosis, CRP and ESR are stable, head CT x 2 stable, ABG stable, RPR stable, ammonia stable, stable B12, TSH, A1c, MRI and EEG x2, LP done after much effort by IR under fluoroscopy on 02/26/2023 rules out bacterial meningitis, CSF findings discussed with neurology and ID physician Dr. Algis Liming, not consistent with any meningitis encephalitis all antibiotics and acyclovir stopped 02/27/2023.  Talking to the neighbors it has become clear that  patient has had gradual downward decline which sounds like dementia for several months, unfortunately wife is quite demented as well, Adult Protective Services has been involved in the past and has been involved again now.  He has a very poor baseline which has been gradually declining over several months due to underlying undiagnosed  dementia.  Will require placement, looking for a bed medically stable.   Dehydration, reduced urine output, AKI.  Poor oral intake likely due to confusion as above, hold ARB, hydrate with IV fluids.  Bladder scan has 53 cc of urine.  Continue to monitor bladder scan intermittently.  History of rheumatoid arthritis.  Post discharge resume methotrexate and sulfasalazine.  Stable CRP ESR.  BPH.  Proscar and Flomax if he can tolerate oral medications.  Hypertension.  On Coreg dose adjusted, oral hydralazine and Imdur added as well along with as needed IV hydralazine for better control  Dyslipidemia.  Check a.m. lipid panel.  Hypokalemia.  Replaced  Nonspecific EKG changes.  Denies any chest pain.  Coreg and statin, noted with EF slightly depressed and some wall motion abnormalities, have to hold antiplatelets due to pending LP.  Troponin trend was stable and in non-ACS pattern, outpatient cardiology follow-up not a candidate for any invasive procedure or testing due to underlying advanced dementia and poor functional status.  Will optimize medical care.  Continue Coreg, statin, and home dose Plavix.       Condition - Extremely Guarded  Family Communication  : Called wife [name] [phone] twice on 02/23/2023 at 8 AM and 10 AM, no response, message left on answering machine on her cell phone.  Wife informs me that her cell phone is broken, discussed with her in detail in room on 02/23/2023.  [name] (Neighbor) [phone] (Mobile) was updated message left morning of 02/24/2023 at 10:04 AM, called again afternoon of 02/24/2023, call 02/25/2023 at 10:22 AM message  left, updated neighbor on 02/26/2023, 02/27/2023  Called on 02/25/2023 [phone]  at 11 AM over 20 rings, no response   Code Status :  Full  Consults  :  Neuro  PUD Prophylaxis :  PPI   Procedures  :     LP - CSF not consistent with any infection  Echocardiogram.  1. Left ventricular ejection fraction, by estimation, is 45 to 50%. Left ventricular ejection fraction by 2D MOD biplane is 49.5 %. The left ventricle has mildly decreased function. The left ventricle demonstrates regional wall motion abnormalities (see  scoring diagram/findings for description). There is mild left ventricular hypertrophy. Left ventricular diastolic function could not be evaluated.  2. Right ventricular systolic function is normal. The right ventricular size is normal.  3. The mitral valve is normal in structure. No evidence of mitral valve regurgitation. No evidence of mitral stenosis.  4. The aortic valve is normal in structure. There is moderate calcification of the aortic valve. There is moderate thickening of the aortic valve. Aortic valve regurgitation is trivial. No aortic stenosis is present.  5. The inferior vena cava is normal in size with greater than 50% respiratory variability, suggesting right atrial pressure of 3 mmHg.   CT head x 2.  Nonacute.    MRI brain.  1. No acute intracranial abnormality. 2. Moderate generalized atrophy and white matter disease likely reflects the sequela of chronic microvascular ischemia. 3. Remote lacunar infarct of the right thalamus. 4. Bilateral mastoid effusions. No obstructing nasopharyngeal lesion is present  EEG.  No active seizures      Disposition Plan  :    Status is: Inpatient  DVT Prophylaxis  :    Place and maintain sequential compression device Start: 02/23/23 1015    Lab Results  Component Value Date   PLT 219 03/01/2023    Diet :  Diet Order  DIET DYS 2 Room service appropriate? No; Fluid consistency: Thin  Diet effective now                     Inpatient Medications  Scheduled Meds:  atorvastatin  40 mg Oral Daily   carvedilol  3.125 mg Oral BID WC   clopidogrel  75 mg Oral Daily   finasteride  5 mg Oral Daily   folic acid  1 mg Oral Daily   isosorbide mononitrate  30 mg Oral Daily   pantoprazole  40 mg Oral Daily   tamsulosin  0.4 mg Oral QPC supper   Continuous Infusions:   PRN Meds:.acetaminophen **OR** acetaminophen, haloperidol lactate, hydrALAZINE, ondansetron **OR** ondansetron (ZOFRAN) IV, senna-docusate     Objective:   Vitals:   03/01/23 1817 03/01/23 1916 03/02/23 0022 03/02/23 0455  BP: 117/65 (!) 119/57 117/70 (!) 125/59  Pulse: 80 81 69 68  Resp: 17 20 18 19   Temp:  98.6 F (37 C) 98.2 F (36.8 C) (!) 97.3 F (36.3 C)  TempSrc:   Oral Oral  SpO2: 97% 97% 99% 99%  Weight:      Height:        Wt Readings from Last 3 Encounters:  02/22/23 68 kg  02/21/23 68 kg  12/25/22 68 kg     Intake/Output Summary (Last 24 hours) at 03/02/2023 0741 Last data filed at 03/02/2023 0332 Gross per 24 hour  Intake 1580 ml  Output 575 ml  Net 1005 ml      Physical Exam  In bed awake but remains confused, supple neck no focal deficits [place].AT,PERRAL Supple Neck, No JVD,   Symmetrical Chest wall movement, Good air movement bilaterally, CTAB RRR,No Gallops,Rubs or new Murmurs,  +ve B.Sounds, Abd Soft, No tenderness,   No Cyanosis, Clubbing or edema        Data Review:    Recent Labs  Lab 02/25/23 0324 02/26/23 0554 02/27/23 0553 02/28/23 0350 03/01/23 0245  WBC 10.0 8.6 8.0 10.1 8.9  HGB 13.3 11.7* 11.0* 12.9* 11.5*  HCT 40.2 35.1* 34.8* 39.6 35.4*  PLT 239 224 211 252 219  MCV 90.3 89.3 89.5 88.6 88.3  MCH 29.9 29.8 28.3 28.9 28.7  MCHC 33.1 33.3 31.6 32.6 32.5  RDW 14.0 14.1 14.1 14.0 14.3  LYMPHSABS 1.9 1.9 1.4 1.6 2.0  MONOABS 1.0 0.9 0.8 1.1* 1.1*  EOSABS 0.1 0.1 0.2 0.1 0.1  BASOSABS 0.1 0.1 0.0 0.0 0.0    Recent Labs  Lab 02/23/23 1053  02/23/23 1059 02/24/23 0737 02/25/23 0324 02/26/23 0340 02/26/23 0554 02/27/23 0553 02/28/23 0350 03/01/23 0245  NA   < >  --  135 137  --  140 139 138 138  K   < >  --  3.6 3.5  --  3.7 3.5 3.4* 3.9  CL   < >  --  105 105  --  107 107 104 108  CO2   < >  --  23 22  --  24 24 25 23   ANIONGAP   < >  --  7 10  --  9 8 9 7   GLUCOSE   < >  --  98 94  --  87 83 95 88  BUN   < >  --  10 11  --  10 11 8 8   CREATININE   < >  --  1.21 1.10  --  1.24 0.97 0.93 0.86  AST  --   --  30 34  --  32 22 24  --   ALT  --   --  16 17  --  14 15 15   --   ALKPHOS  --   --  29* 33*  --  27* 27* 31*  --   BILITOT  --   --  0.6 0.8  --  0.4 0.8 0.7  --   ALBUMIN  --   --  2.9* 3.0*  --  2.5* 2.3* 2.7*  --   CRP  --   --  0.8 2.3* 4.3*  --  3.3*  --   --   PROCALCITON  --   --  <0.10 <0.10  --  <0.10 <0.10 <0.10  --   HGBA1C  --  5.5  --   --   --   --   --   --   --   BNP  --   --  408.6* 460.0*  --  965.2* 470.3*  --  1,535.3*  MG  --   --  1.7 1.9  --  1.9 1.9  --  1.8  CALCIUM   < >  --  9.4 9.0  --  8.6* 8.4* 8.8* 8.9   < > = values in this interval not displayed.      Recent Labs  Lab 02/23/23 1053 02/23/23 1059 02/24/23 0737 02/25/23 0324 02/26/23 0340 02/26/23 0554 02/27/23 0553 02/28/23 0350 03/01/23 0245  CRP  --   --  0.8 2.3* 4.3*  --  3.3*  --   --   PROCALCITON  --   --  <0.10 <0.10  --  <0.10 <0.10 <0.10  --   HGBA1C  --  5.5  --   --   --   --   --   --   --   BNP  --   --  408.6* 460.0*  --  965.2* 470.3*  --  1,535.3*  MG  --   --  1.7 1.9  --  1.9 1.9  --  1.8  CALCIUM   < >  --  9.4 9.0  --  8.6* 8.4* 8.8* 8.9   < > = values in this interval not displayed.    --------------------------------------------------------------------------------------------------------------- Lab Results  Component Value Date   CHOL 152 02/24/2023   HDL 28 (L) 02/24/2023   LDLCALC 105 (H) 02/24/2023   TRIG 94 02/24/2023   CHOLHDL 5.4 02/24/2023    Lab Results  Component Value Date    HGBA1C 5.5 02/23/2023      Micro Results Recent Results (from the past 240 hour(s))  MRSA Next Gen by PCR, Nasal     Status: None   Collection Time: 02/23/23  3:31 AM   Specimen: Nasal Mucosa; Nasal Swab  Result Value Ref Range Status   MRSA by PCR Next Gen NOT DETECTED NOT DETECTED Final    Comment: (NOTE) The GeneXpert MRSA Assay (FDA approved for NASAL specimens only), is one component of a comprehensive MRSA colonization surveillance program. It is not intended to diagnose MRSA infection nor to guide or monitor treatment for MRSA infections. Test performance is not FDA approved in patients less than 52 years old. Performed at [facility], [address] [place], [place] [address]   CSF culture w Gram Stain     Status: None   Collection Time: 02/24/23  2:18 PM   Specimen: CSF; Cerebrospinal Fluid  Result Value Ref Range Status   Specimen Description CSF  Final   Special Requests NONE  Final   Gram Stain   Final    WBC PRESENT,BOTH PMN AND MONONUCLEAR NO ORGANISMS SEEN CYTOSPIN SMEAR    Culture   Final    NO GROWTH 3 DAYS Performed at [facility], [address] [place], [place] [address]    Report Status 03/01/2023 FINAL  Final    Radiology Reports DG Chest Port 1 View  Result Date: 03/01/2023 CLINICAL DATA:  77 year old male with history of shortness of breath. EXAM: PORTABLE CHEST 1 VIEW COMPARISON:  Chest x-ray 02/27/2023. FINDINGS: Mild elevation of the left hemidiaphragm. Slight obscuration of the lateral aspect of the left hemidiaphragm focus of atelectasis and/or consolidation. Minimal subsegmental atelectasis or scarring in the periphery of the right lung base. Diffuse interstitial prominence and widespread peribronchial cuffing. No pleural effusions. No pneumothorax. No evidence of pulmonary edema. Heart size is borderline enlarged. The patient is rotated to the left on today's exam, resulting in distortion of the mediastinal contours and  reduced diagnostic sensitivity and specificity for mediastinal pathology. Atherosclerotic calcifications are noted in the thoracic aorta. IMPRESSION: 1. Previously noted opacity in the right upper lobe is no longer evident. Persistent opacity at the left base favored to reflect atelectasis or consolidation. 2. Diffuse interstitial prominence and widespread peribronchial cuffing suggestive of chronic bronchitis, similar to prior studies. 3. Aortic atherosclerosis. Electronically Signed   By: Trudie Reed M.D.   On: 03/01/2023 08:46   DG Chest Port 1 View  Result Date: 02/27/2023 CLINICAL DATA:  77 year old male with history of shortness of breath. EXAM: PORTABLE CHEST 1 VIEW COMPARISON:  Chest x-ray 12/25/2022. FINDINGS: Lung volumes are normal. Opacity in the left base partially obscuring the lateral left hemidiaphragm. Ill-defined opacity also noted in the apex of the right lung (although this could potentially simply reflect prominent first costochondral junction). No pleural effusions. No pneumothorax. No evidence of pulmonary edema. Heart size is borderline enlarged. Upper mediastinal contours are within normal limits. Atherosclerotic calcifications are noted in the thoracic aorta. IMPRESSION: 1. Opacities in the right upper lobe near the apex and in the periphery of the left lung base. These are new compared to the recent prior study from 12/25/2022, concerning for developing areas of airspace consolidation, although an underlying mass in the right upper lobe is not excluded. Followup PA and lateral chest X-ray is recommended in 3-4 weeks following trial of antibiotic therapy to ensure resolution and exclude underlying malignancy. Alternatively, if there is strong clinical concern for malignancy, further evaluation with contrast-enhanced chest CT could be considered at this time. 2. Aortic atherosclerosis. Electronically Signed   By: Trudie Reed M.D.   On: 02/27/2023 07:02   DG FL GUIDED LUMBAR  PUNCTURE  Result Date: 02/26/2023 CLINICAL DATA:  77 year old male with complaint of confusion, altered mental status. Request for lumbar puncture. EXAM: LUMBAR PUNCTURE UNDER FLUOROSCOPY PROCEDURE: An appropriate skin entry site was determined fluoroscopically. Operator donned sterile gloves and mask. Skin site was marked, then prepped with Betadine, draped in usual sterile fashion, and infiltrated locally with 1% lidocaine. A 20 gauge spinal needle advanced into the thecal sac at L2-L3 from a right interlaminar approach. Clear colorless CSF spontaneously returned, with opening pressure of 20 cm water. 5 ml CSF were collected and divided among 4 sterile vials for the requested laboratory studies. The needle was then removed. The patient restless, not following commands during procedure, but ultimately tolerated the procedure well and there were no complications. FLUOROSCOPY: Radiation  Exposure Index (as provided by the fluoroscopic device): 2.6 mGy Kerma IMPRESSION: Technically successful lumbar puncture under fluoroscopy. This exam was performed by Loyce Dys PA-C, and was supervised and interpreted by Ruel Favors, MD. Electronically Signed   By: Judie Petit.  Shick M.D.   On: 02/26/2023 14:48   Overnight EEG with video  Result Date: 02/26/2023 Charlsie Quest, MD     02/26/2023  9:13 AM Patient Name: NENO HOHENSEE MRN: [id number] Epilepsy Attending: Charlsie Quest Referring Physician/Provider: Marjorie Smolder, NP Duration: 02/25/2023 1353 to 02/26/2023 0900 Patient history: 77yo M with ams getting eeg to evaluate for seizure Level of alertness: lethargic , asleep AEDs during EEG study: None Technical aspects: This EEG study was done with scalp electrodes positioned according to the 10-20 International system of electrode placement. Electrical activity was reviewed with band pass filter of 1-70Hz , sensitivity of 7 uV/mm, display speed of 64mm/sec with a 60Hz  notched filter applied as appropriate. EEG data  were recorded continuously and digitally stored.  Video monitoring was available and reviewed as appropriate. Description: No clear posterior dominant rhythm was seen.  Sleep was characterized by sleep symptoms (12 to 14 Hz), maximal frontocentral region.  EEG showed continuous generalized 3 to 5 Hz theta-delta slowing. Hyperventilation and photic stimulation were not performed.   ABNORMALITY - Continuous slow, generalized IMPRESSION: 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 No seizures or epileptiform discharges were seen throughout the recording. Charlsie Quest      Signature  -   Susa Raring M.D on 03/02/2023 at 7:41 AM   -  To page go to [email or web address]

## 2023-03-02 NOTE — Plan of Care (Signed)
Patient's restraints discontinued at this time. Patient remains confused, but is calm and able to be redirected.

## 2023-03-02 NOTE — Plan of Care (Signed)

## 2023-03-03 DIAGNOSIS — R410 Disorientation, unspecified: Secondary | ICD-10-CM | POA: Diagnosis not present

## 2023-03-03 LAB — CBC WITH DIFFERENTIAL/PLATELET
Abs Immature Granulocytes: 0.04 10*3/uL (ref 0.00–0.07)
Basophils Absolute: 0 10*3/uL (ref 0.0–0.1)
Basophils Relative: 1 %
Eosinophils Absolute: 0.2 10*3/uL (ref 0.0–0.5)
Eosinophils Relative: 2 %
HCT: 32.8 % — ABNORMAL LOW (ref 39.0–52.0)
Hemoglobin: 10.6 g/dL — ABNORMAL LOW (ref 13.0–17.0)
Immature Granulocytes: 1 %
Lymphocytes Relative: 23 %
Lymphs Abs: 1.9 10*3/uL (ref 0.7–4.0)
MCH: 28.8 pg (ref 26.0–34.0)
MCHC: 32.3 g/dL (ref 30.0–36.0)
MCV: 89.1 fL (ref 80.0–100.0)
Monocytes Absolute: 0.9 10*3/uL (ref 0.1–1.0)
Monocytes Relative: 11 %
Neutro Abs: 5.3 10*3/uL (ref 1.7–7.7)
Neutrophils Relative %: 62 %
Platelets: 286 10*3/uL (ref 150–400)
RBC: 3.68 MIL/uL — ABNORMAL LOW (ref 4.22–5.81)
RDW: 14.6 % (ref 11.5–15.5)
WBC: 8.4 10*3/uL (ref 4.0–10.5)
nRBC: 0 % (ref 0.0–0.2)

## 2023-03-03 LAB — BASIC METABOLIC PANEL
Anion gap: 8 (ref 5–15)
BUN: 18 mg/dL (ref 8–23)
CO2: 23 mmol/L (ref 22–32)
Calcium: 8.9 mg/dL (ref 8.9–10.3)
Chloride: 107 mmol/L (ref 98–111)
Creatinine, Ser: 1.39 mg/dL — ABNORMAL HIGH (ref 0.61–1.24)
GFR, Estimated: 52 mL/min — ABNORMAL LOW (ref >60)
Glucose, Bld: 105 mg/dL — ABNORMAL HIGH (ref 70–99)
Potassium: 3.9 mmol/L (ref 3.5–5.1)
Sodium: 138 mmol/L (ref 135–145)

## 2023-03-03 LAB — BRAIN NATRIURETIC PEPTIDE: B Natriuretic Peptide: 489.2 pg/mL — ABNORMAL HIGH (ref 0.0–100.0)

## 2023-03-03 LAB — PHOSPHORUS: Phosphorus: 2.6 mg/dL (ref 2.5–4.6)

## 2023-03-03 LAB — MAGNESIUM: Magnesium: 1.8 mg/dL (ref 1.7–2.4)

## 2023-03-03 MED ORDER — HYDRALAZINE HCL 25 MG PO TABS
25.0000 mg | ORAL_TABLET | Freq: Three times a day (TID) | ORAL | Status: DC
  Administered 2023-03-03 – 2023-03-04 (×4): 25 mg via ORAL
  Filled 2023-03-03 (×4): qty 1

## 2023-03-03 MED ORDER — LACTATED RINGERS IV SOLN: INTRAVENOUS | Status: AC

## 2023-03-03 NOTE — Progress Notes (Signed)
Physical Therapy Treatment Patient Details Name: Dennis [name] MRN: [id number] DOB: 1946-05-13 Today's Date: 03/03/2023   History of Present Illness 77 yo male presenting to ED 10/5 with AMS. CTH and MRI brain negative; undergoing long-term EEG and lumbar puncture pending. PMH includes HLD, BPH, HTN, rheumatoid arthritis, COPD, Barrett's esophagus, OSA, prior CVA,  dysarthria, LLE weakness, dementia.    PT Comments  Pt received in supine, agreeable to therapy session, pt confused but following most simple motor commands, pt confused and seeing objects outside that were not present. Attempted to call pt's spouse per his request and she did not answer, neighbor's number listed as contact in chart and she provided "current" phone numbers for spouse (updated in chart) but spouse did not answer these either, RN notified of numbers. Pt needing up to modA for bed mobility due to pt difficulty sequencing and up to minA for sit to stand and lateral steps toward HOB. Pt tolerated ~25 mins of seated balance activity and worked on unsupported sitting, self-feeding and lateral/anterior reaching. Pt quick to fatigue in standing and was not able to progress gait training this date. Pt continues to benefit from PT services to progress toward functional mobility goals.     If plan is discharge home, recommend the following: Assistance with cooking/housework;Assistance with feeding;Direct supervision/assist for medications management;Direct supervision/assist for financial management;Assist for transportation;Help with stairs or ramp for entrance;Supervision due to cognitive status;Two people to help with walking and/or transfers   Can travel by private vehicle     Yes  Equipment Recommendations  Other (comment) (TBD, will need to clarify with family/friend what he owns)    Recommendations for Other Services OT consult     Precautions / Restrictions Precautions Precautions: Fall Precaution Comments: wrist  restraints, mitts Restrictions Weight Bearing Restrictions: No     Mobility  Bed Mobility Overal bed mobility: Needs Assistance Bed Mobility: Supine to Sit, Sit to Supine     Supine to sit: HOB elevated, Used rails, Mod assist Sit to supine: Mod assist, Used rails   General bed mobility comments: Increased assist to sit up to L EOB and to return to supine due to fatigue/cognitive deficit.    Transfers Overall transfer level: Needs assistance Equipment used: Rolling walker (2 wheels) Transfers: Sit to/from Stand, Bed to chair/wheelchair/BSC Sit to Stand: Min assist   Step pivot transfers: Min assist       General transfer comment: min A from EOB<>RW, cues for safe UE placement but pt unable to follow verbal cues well, needs hand over hand assist to sequence for transfers.Small shuffled steps along EOB toward HOB, defer OOB to chair given pt cognitive deficit and per RN was angry earlier and was not safe enough for chair today.    Ambulation/Gait             Pre-gait activities: ~61ft lateral steps along EOB to [facility] with mod cues and minA for balance and RW management. Defer gait trial as pt c/o fatigue after simulated step pivot and had sat EOB >20 mins.     Stairs             Wheelchair Mobility     Tilt Bed    Modified Rankin (Stroke Patients Only)       Balance Overall balance assessment: Needs assistance Sitting-balance support: Feet supported, No upper extremity supported, Single extremity supported Sitting balance-Leahy Scale: Fair Sitting balance - Comments: sitting EOB ~25 mins to eat his dinner with full supervision. CGA to Supervision,  no significant LOB but pt often using single UE support for balance initially.   Standing balance support: Bilateral upper extremity supported Standing balance-Leahy Scale: Poor Standing balance comment: RW and external assist to manage AD.                            Cognition Arousal:  Alert Behavior During Therapy: WFL for tasks assessed/performed Overall Cognitive Status: Impaired/Different from baseline Area of Impairment: Attention, Memory, Following commands, Safety/judgement, Orientation, Awareness, Problem solving                 Orientation Level: Disoriented to, Place, Time, Situation Current Attention Level: Sustained Memory: Decreased short-term memory, Decreased recall of precautions Following Commands: Follows one step commands with increased time Safety/Judgement: Decreased awareness of safety, Decreased awareness of deficits Awareness: Intellectual Problem Solving: Slow processing, Decreased initiation, Requires verbal cues, Difficulty sequencing, Requires tactile cues General Comments: Pt wearing mitts and wrist restraints, requesting IV and identification wristband off. Pt hallucinating figures on roof outside (dogs and a woman at different times) but admits that normally he wears glasses and when reoriented to location and notified that there was no one on the roof, pt able to be redirected. Pt able to assist with self-feeding while sitting EOB and when takes bites of solid foods, he chews them for a long time then spits them on to tray table or in napkin. RN notified. Pt seems to swallow water and his soup well with no coughing or oral holding of liquids.        Exercises General Exercises - Lower Extremity Hip Flexion/Marching: AROM, Both, Seated, 10 reps Other Exercises Other Exercises: standing hip flexion x5 reps ea Other Exercises: static midline sitting ~20 mins for core strengthening and to work on balance during unsupported seated task and reaching anterior/laterally ~10-15 reps ea direction (pt with difficulty reachign outside BOS given cognitive deficit)    General Comments General comments (skin integrity, edema, etc.): SpO2 WFL on RA (sensor on foot due to pt cognitive deficit) and HR WFL.      Pertinent Vitals/Pain Pain  Assessment Pain Assessment: PAINAD Breathing: normal Negative Vocalization: none Facial Expression: smiling or inexpressive Body Language: relaxed Consolability: no need to console PAINAD Score: 0 Pain Intervention(s): Monitored during session, Repositioned    Home Living                          Prior Function            PT Goals (current goals can now be found in the care plan section) Acute Rehab PT Goals Patient Stated Goal: unable to state PT Goal Formulation: Patient unable to participate in goal setting Time For Goal Achievement: 03/10/23 Progress towards PT goals: Progressing toward goals    Frequency    Min 3X/week      PT Plan      Co-evaluation              AM-PAC PT "6 Clicks" Mobility   Outcome Measure  Help needed turning from your back to your side while in a flat bed without using bedrails?: A Little Help needed moving from lying on your back to sitting on the side of a flat bed without using bedrails?: A Lot Help needed moving to and from a bed to a chair (including a wheelchair)?: A Lot Help needed standing up from a chair using your arms (e.g., wheelchair  or bedside chair)?: A Little Help needed to walk in hospital room?: Total Help needed climbing 3-5 steps with a railing? : Total 6 Click Score: 12    End of Session Equipment Utilized During Treatment: Oxygen Activity Tolerance: Patient tolerated treatment well;Other (comment);Treatment limited secondary to medical complications (Comment) (cognitive deficit) Patient left: in bed;with call bell/phone within reach;with bed alarm set;Other (comment);with restraints reapplied (bed in chair posture, pt heels floated, mitts also donned.) Nurse Communication: Mobility status;Other (comment) (pt IV beeping; pt with oral holding and spitting out solid bites of food (many attempts). Pt seems to be eating liquid soup texture and swallowing water well.) PT Visit Diagnosis: Unsteadiness on  feet (R26.81);Other abnormalities of gait and mobility (R26.89);History of falling (Z91.81);Muscle weakness (generalized) (M62.81)     Time: 2831-5176 PT Time Calculation (min) (ACUTE ONLY): 46 min  Charges:    $Therapeutic Exercise: 8-22 mins $Therapeutic Activity: 23-37 mins PT General Charges $$ ACUTE PT VISIT: 1 Visit                     Temika Sutphin P., PTA Acute Rehabilitation Services Secure Chat Preferred 9a-5:30pm Office: [phone]    Dorathy Kinsman [facility] 03/03/2023, 6:40 PM

## 2023-03-03 NOTE — Progress Notes (Signed)
  Bedside nurse reporting that patient is not really right directable and continues to jump out of the bed. -Ordered 2 points of restraint of the wrist and renewed bedside sitter.  Tereasa Coop, MD [facility] 03/03/2023, 6:46 AM

## 2023-03-03 NOTE — Progress Notes (Signed)
PROGRESS NOTE                                                                                                                                                                                                             Patient Demographics:    Dennis [name], is a 77 y.o. male, DOB - 08/09/1945, UJW:[id number]  Outpatient Primary MD for the patient is Pcp, No    LOS - 9  Admit date - 02/22/2023    Chief Complaint  Patient presents with   Altered Mental Status       Brief Narrative (HPI from H&P)   77 year old male with past medical history of HLD, BPH, HTN, rheumatoid arthritis, COPD, Barrett's esophagus, OSA does not use CPAP, history of CVA. Patient sent in for altered mentation. At baseline patient has a ataxic gait but is not confused. Over the course of the past week he has become increasingly confused. He was brought to the ER.  In the ER he was trying to escape from the ER room had to be IVC need placed with 1 is to 1 sitter, had 2 CT scans of the head which were unremarkable, he was admitted for further workup of encephalopathy.   Subjective:   Patient in bed awake but confused, denies any headache or chest pain   Assessment  & Plan :   Gradually progressive metabolic encephalopathy over the course of 5 to 7 days.  Brought in by wife for worsening confusion, confusion persisted in the ER, CT head x 2 several hours apart is nonacute, no focal deficits apparent. He was afebrile, no leukocytosis, CRP and ESR are stable, head CT x 2 stable, ABG stable, RPR stable, ammonia stable, stable B12, TSH, A1c, MRI and EEG x2, LP done after much effort by IR under fluoroscopy on 02/26/2023 rules out bacterial meningitis, CSF findings discussed with neurology and ID physician Dr. Algis Liming, not consistent with any meningitis encephalitis all antibiotics and acyclovir stopped 02/27/2023.  Talking to the neighbors it has become clear that  patient has had gradual downward decline which sounds like dementia for several months, unfortunately wife is quite demented as well, Adult Protective Services has been involved in the past and has been involved again now.  He has a very poor baseline which has been gradually declining over several months due to underlying undiagnosed  dementia.  Will require placement, looking for a bed medically stable.   Dehydration, reduced urine output, AKI.  Poor oral intake likely due to confusion as above, hold ARB, hydrate again with IV fluids on 03/03/2023.  Bladder scan has 53 cc of urine.  Continue to monitor bladder scan intermittently.  History of rheumatoid arthritis.  Post discharge resume methotrexate and sulfasalazine.  Stable CRP ESR.  BPH.  Proscar and Flomax if he can tolerate oral medications.  Hypertension.  He is on combination of Coreg, Imdur, hydralazine added on 03/03/2023 for better control, as needed IV hydralazine on board as well.  Dyslipidemia.  Check a.m. lipid panel.  Nonspecific EKG changes.  Denies any chest pain.  Coreg and statin, noted with EF slightly depressed and some wall motion abnormalities, have to hold antiplatelets due to pending LP.  Troponin trend was stable and in non-ACS pattern, outpatient cardiology follow-up not a candidate for any invasive procedure or testing due to underlying advanced dementia and poor functional status.  Will optimize medical care.  Continue Coreg, statin, and home dose Plavix.       Condition - Extremely Guarded  Family Communication  : Called wife [name] [phone] twice on 02/23/2023 at 8 AM and 10 AM, no response, message left on answering machine on her cell phone.  Wife informs me that her cell phone is broken, discussed with her in detail in room on 02/23/2023.  [name] (Neighbor) [phone] (Mobile) was updated message left morning of 02/24/2023 at 10:04 AM, called again afternoon of 02/24/2023, call 02/25/2023 at 10:22 AM  message left, updated neighbor on 02/26/2023, 02/27/2023  Called on 02/25/2023 [phone]  at 11 AM over 20 rings, no response   Code Status :  Full  Consults  :  Neuro  PUD Prophylaxis :  PPI   Procedures  :     LP - CSF not consistent with any infection  Echocardiogram.  1. Left ventricular ejection fraction, by estimation, is 45 to 50%. Left ventricular ejection fraction by 2D MOD biplane is 49.5 %. The left ventricle has mildly decreased function. The left ventricle demonstrates regional wall motion abnormalities (see  scoring diagram/findings for description). There is mild left ventricular hypertrophy. Left ventricular diastolic function could not be evaluated.  2. Right ventricular systolic function is normal. The right ventricular size is normal.  3. The mitral valve is normal in structure. No evidence of mitral valve regurgitation. No evidence of mitral stenosis.  4. The aortic valve is normal in structure. There is moderate calcification of the aortic valve. There is moderate thickening of the aortic valve. Aortic valve regurgitation is trivial. No aortic stenosis is present.  5. The inferior vena cava is normal in size with greater than 50% respiratory variability, suggesting right atrial pressure of 3 mmHg.   CT head x 2.  Nonacute.    MRI brain.  1. No acute intracranial abnormality. 2. Moderate generalized atrophy and white matter disease likely reflects the sequela of chronic microvascular ischemia. 3. Remote lacunar infarct of the right thalamus. 4. Bilateral mastoid effusions. No obstructing nasopharyngeal lesion is present  EEG.  No active seizures      Disposition Plan  :    Status is: Inpatient  DVT Prophylaxis  :    Place and maintain sequential compression device Start: 02/23/23 1015    Lab Results  Component Value Date   PLT 286 03/03/2023    Diet :  Diet Order  DIET DYS 2 Room service appropriate? No; Fluid consistency: Thin  Diet  effective now                    Inpatient Medications  Scheduled Meds:  atorvastatin  40 mg Oral Daily   carvedilol  3.125 mg Oral BID WC   clopidogrel  75 mg Oral Daily   finasteride  5 mg Oral Daily   folic acid  1 mg Oral Daily   hydrALAZINE  25 mg Oral Q8H   isosorbide mononitrate  30 mg Oral Daily   pantoprazole  40 mg Oral Daily   tamsulosin  0.4 mg Oral QPC supper   Continuous Infusions:  lactated ringers      PRN Meds:.acetaminophen **OR** acetaminophen, haloperidol lactate, hydrALAZINE, ondansetron **OR** ondansetron (ZOFRAN) IV, senna-docusate     Objective:   Vitals:   03/02/23 2121 03/02/23 2200 03/02/23 2345 03/03/23 0359  BP: (!) 147/78  118/67 (!) 169/79  Pulse: 100 99 88 90  Resp: 19  18 18   Temp: 98 F (36.7 C)  (!) 97.5 F (36.4 C) 97.9 F (36.6 C)  TempSrc: Oral  Oral Oral  SpO2: 96% 94% 96% 96%  Weight:      Height:        Wt Readings from Last 3 Encounters:  02/22/23 68 kg  02/21/23 68 kg  12/25/22 68 kg     Intake/Output Summary (Last 24 hours) at 03/03/2023 0838 Last data filed at 03/03/2023 0245 Gross per 24 hour  Intake 720 ml  Output 3600 ml  Net -2880 ml      Physical Exam  In bed awake but remains confused, supple neck no focal deficits [place].AT,PERRAL Supple Neck, No JVD,   Symmetrical Chest wall movement, Good air movement bilaterally, CTAB RRR,No Gallops,Rubs or new Murmurs,  +ve B.Sounds, Abd Soft, No tenderness,   No Cyanosis, Clubbing or edema        Data Review:    Recent Labs  Lab 02/26/23 0554 02/27/23 0553 02/28/23 0350 03/01/23 0245 03/03/23 0313  WBC 8.6 8.0 10.1 8.9 8.4  HGB 11.7* 11.0* 12.9* 11.5* 10.6*  HCT 35.1* 34.8* 39.6 35.4* 32.8*  PLT 224 211 252 219 286  MCV 89.3 89.5 88.6 88.3 89.1  MCH 29.8 28.3 28.9 28.7 28.8  MCHC 33.3 31.6 32.6 32.5 32.3  RDW 14.1 14.1 14.0 14.3 14.6  LYMPHSABS 1.9 1.4 1.6 2.0 1.9  MONOABS 0.9 0.8 1.1* 1.1* 0.9  EOSABS 0.1 0.2 0.1 0.1 0.2  BASOSABS 0.1  0.0 0.0 0.0 0.0    Recent Labs  Lab 02/25/23 0324 02/26/23 0340 02/26/23 0554 02/27/23 0553 02/28/23 0350 03/01/23 0245 03/03/23 0313  NA 137  --  140 139 138 138 138  K 3.5  --  3.7 3.5 3.4* 3.9 3.9  CL 105  --  107 107 104 108 107  CO2 22  --  24 24 25 23 23   ANIONGAP 10  --  9 8 9 7 8   GLUCOSE 94  --  87 83 95 88 105*  BUN 11  --  10 11 8 8 18   CREATININE 1.10  --  1.24 0.97 0.93 0.86 1.39*  AST 34  --  32 22 24  --   --   ALT 17  --  14 15 15   --   --   ALKPHOS 33*  --  27* 27* 31*  --   --   BILITOT 0.8  --  0.4 0.8 0.7  --   --  ALBUMIN 3.0*  --  2.5* 2.3* 2.7*  --   --   CRP 2.3* 4.3*  --  3.3*  --   --   --   PROCALCITON <0.10  --  <0.10 <0.10 <0.10  --   --   BNP 460.0*  --  965.2* 470.3*  --  1,535.3* 489.2*  MG 1.9  --  1.9 1.9  --  1.8 1.8  CALCIUM 9.0  --  8.6* 8.4* 8.8* 8.9 8.9      Recent Labs  Lab 02/25/23 0324 02/26/23 0340 02/26/23 0554 02/27/23 0553 02/28/23 0350 03/01/23 0245 03/03/23 0313  CRP 2.3* 4.3*  --  3.3*  --   --   --   PROCALCITON <0.10  --  <0.10 <0.10 <0.10  --   --   BNP 460.0*  --  965.2* 470.3*  --  1,535.3* 489.2*  MG 1.9  --  1.9 1.9  --  1.8 1.8  CALCIUM 9.0  --  8.6* 8.4* 8.8* 8.9 8.9    --------------------------------------------------------------------------------------------------------------- Lab Results  Component Value Date   CHOL 152 02/24/2023   HDL 28 (L) 02/24/2023   LDLCALC 105 (H) 02/24/2023   TRIG 94 02/24/2023   CHOLHDL 5.4 02/24/2023    Lab Results  Component Value Date   HGBA1C 5.5 02/23/2023      Micro Results Recent Results (from the past 240 hour(s))  MRSA Next Gen by PCR, Nasal     Status: None   Collection Time: 02/23/23  3:31 AM   Specimen: Nasal Mucosa; Nasal Swab  Result Value Ref Range Status   MRSA by PCR Next Gen NOT DETECTED NOT DETECTED Final    Comment: (NOTE) The GeneXpert MRSA Assay (FDA approved for NASAL specimens only), is one component of a comprehensive MRSA  colonization surveillance program. It is not intended to diagnose MRSA infection nor to guide or monitor treatment for MRSA infections. Test performance is not FDA approved in patients less than 94 years old. Performed at [facility], [address] [place], [place] [address]   CSF culture w Gram Stain     Status: None   Collection Time: 02/24/23  2:18 PM   Specimen: CSF; Cerebrospinal Fluid  Result Value Ref Range Status   Specimen Description CSF  Final   Special Requests NONE  Final   Gram Stain   Final    WBC PRESENT,BOTH PMN AND MONONUCLEAR NO ORGANISMS SEEN CYTOSPIN SMEAR    Culture   Final    NO GROWTH 3 DAYS Performed at [facility], [address] [place], [place] [address]    Report Status 03/01/2023 FINAL  Final    Radiology Reports DG Chest Port 1 View  Result Date: 03/01/2023 CLINICAL DATA:  77 year old male with history of shortness of breath. EXAM: PORTABLE CHEST 1 VIEW COMPARISON:  Chest x-ray 02/27/2023. FINDINGS: Mild elevation of the left hemidiaphragm. Slight obscuration of the lateral aspect of the left hemidiaphragm focus of atelectasis and/or consolidation. Minimal subsegmental atelectasis or scarring in the periphery of the right lung base. Diffuse interstitial prominence and widespread peribronchial cuffing. No pleural effusions. No pneumothorax. No evidence of pulmonary edema. Heart size is borderline enlarged. The patient is rotated to the left on today's exam, resulting in distortion of the mediastinal contours and reduced diagnostic sensitivity and specificity for mediastinal pathology. Atherosclerotic calcifications are noted in the thoracic aorta. IMPRESSION: 1. Previously noted opacity in the right upper lobe is no longer evident. Persistent opacity  at the left base favored to reflect atelectasis or consolidation. 2. Diffuse interstitial prominence and widespread peribronchial cuffing suggestive of chronic bronchitis, similar to prior  studies. 3. Aortic atherosclerosis. Electronically Signed   By: Trudie Reed M.D.   On: 03/01/2023 08:46      Signature  -   Susa Raring M.D on 03/03/2023 at 8:38 AM   -  To page go to [email or web address]

## 2023-03-03 NOTE — Plan of Care (Signed)
  Problem: Education: Goal: Knowledge of General Education information will improve Description: Including pain rating scale, medication(s)/side effects and non-pharmacologic comfort measures Outcome: Progressing   Problem: Health Behavior/Discharge Planning: Goal: Ability to manage health-related needs will improve Outcome: Progressing   Problem: Clinical Measurements: Goal: Ability to maintain clinical measurements within normal limits will improve Outcome: Progressing Goal: Will remain free from infection Outcome: Progressing Goal: Diagnostic test results will improve Outcome: Progressing Goal: Respiratory complications will improve Outcome: Progressing Goal: Cardiovascular complication will be avoided Outcome: Progressing   Problem: Activity: Goal: Risk for activity intolerance will decrease Outcome: Progressing   Problem: Nutrition: Goal: Adequate nutrition will be maintained Outcome: Progressing   Problem: Coping: Goal: Level of anxiety will decrease Outcome: Progressing   Problem: Elimination: Goal: Will not experience complications related to bowel motility Outcome: Progressing Goal: Will not experience complications related to urinary retention Outcome: Progressing   Problem: Pain Managment: Goal: General experience of comfort will improve Outcome: Progressing   Problem: Safety: Goal: Ability to remain free from injury will improve Outcome: Progressing   Problem: Skin Integrity: Goal: Risk for impaired skin integrity will decrease Outcome: Progressing   Problem: Education: Goal: Knowledge of General Education information will improve Description: Including pain rating scale, medication(s)/side effects and non-pharmacologic comfort measures Outcome: Progressing   Problem: Health Behavior/Discharge Planning: Goal: Ability to manage health-related needs will improve Outcome: Progressing   Problem: Health Behavior/Discharge Planning: Goal: Ability to  manage health-related needs will improve Outcome: Progressing   Problem: Clinical Measurements: Goal: Ability to maintain clinical measurements within normal limits will improve Outcome: Progressing   Problem: Clinical Measurements: Goal: Will remain free from infection Outcome: Progressing   Problem: Clinical Measurements: Goal: Diagnostic test results will improve Outcome: Progressing   Problem: Clinical Measurements: Goal: Respiratory complications will improve Outcome: Progressing   Problem: Clinical Measurements: Goal: Cardiovascular complication will be avoided Outcome: Progressing   Problem: Activity: Goal: Risk for activity intolerance will decrease Outcome: Progressing   Problem: Nutrition: Goal: Adequate nutrition will be maintained Outcome: Progressing   Problem: Nutrition: Goal: Adequate nutrition will be maintained Outcome: Progressing   Problem: Coping: Goal: Level of anxiety will decrease Outcome: Progressing   Problem: Elimination: Goal: Will not experience complications related to bowel motility Outcome: Progressing   Problem: Elimination: Goal: Will not experience complications related to urinary retention Outcome: Progressing   Problem: Pain Managment: Goal: General experience of comfort will improve Outcome: Progressing   Problem: Safety: Goal: Ability to remain free from injury will improve Outcome: Progressing   Problem: Skin Integrity: Goal: Risk for impaired skin integrity will decrease Outcome: Progressing

## 2023-03-03 NOTE — TOC Progression Note (Signed)
Transition of Care [facility]) - Progression Note    Patient Details  Name: Dennis [name] MRN: [id number] Date of Birth: 1946-01-05  Transition of Care [facility]) CM/SW Contact  Mearl Latin, LCSW Phone Number: 03/03/2023, 2:50 PM  Clinical Narrative:    CSW met with patient's significant other Misty Stanley) and neighbor Tammy along with [employer], Tammy. [facility] is reviewing patient to see if they can meet his needs. Patient is currently in mitts. Liaison went over need for Medicaid application in the event patient requires long term care. Neighbor Tammy stated she will take Misty Stanley over to [facility] to meet with their admissions team to go over the [facility] application requirements and see if she can help get Misty Stanley to the bank to obtain statements as she does not have online banking. Per APS, they were not helping patient with Medicaid application and would only become involved if Misty Stanley denied SNF placement. CSW will speak with Nichole with APS as neighbor reports Lisa's Dementia seems to be getting worse and she called her daughter in [place] to start making arrangements for in home care for Misty Stanley but her daughter will not assume responsibility for patient. He may require a Guardian.    Expected Discharge Plan: Skilled Nursing Facility Barriers to Discharge: Continued Medical Work up, Requiring sitter/restraints, SNF Pending bed offer, Unsafe home situation  Expected Discharge Plan and Services In-house Referral: Clinical Social Work   Post Acute Care Choice: Skilled Nursing Facility Living arrangements for the past 2 months: Single Family Home                                       Social Determinants of Health (SDOH) Interventions SDOH Screenings   Food Insecurity: No Food Insecurity (07/26/2020)   Received from [facility], [facility]  Transportation Needs: No Transportation Needs (04/04/2022)   Received from [facility], [facility]  Tobacco Use: Medium Risk  (02/22/2023)    Readmission Risk Interventions     No data to display

## 2023-03-03 NOTE — Progress Notes (Signed)
Speech Language Pathology Treatment: Dysphagia  Patient Details Name: Dennis [name] MRN: [id number] DOB: [birth year] Today's Date: 03/03/2023 Time: 3244-0102 SLP Time Calculation (min) (ACUTE ONLY): 13 min  Assessment / Plan / Recommendation Clinical Impression  Pt awake and alert, on room air this date with decreased WOB noted throughout PO trials. Observed pt with Dys 2 trials from meal tray and thin liquids with no overt s/s of dysphagia or aspiration. Given pt's improvements in mentation and respiratory status, recommend upgrading to Dys 3 texture solids and continuing thin liquids. Continue to provide full assistance with feeding only when pt is fully awake and alert. Pt's mentation may continue to fluctuate and he may require ongoing diet modification management at next venue of care. No further SLP f/u is needed at this time. Will s/o.    HPI HPI: Dennis [name] is a 77 yo male presenting to ED 10/5 with AMS. CTH negative. MRI Brain pending. PMH includes HLD, BPH, HTN, rheumatoid arthritis, COPD, Barrett's esophagus, OSA, prior CVA; ST saw for BSE on 10/6 with impaired mentation impacting swallow function.  ST f/u for PO trials/swallow re-assessment.      SLP Plan  All goals met      Recommendations for follow up therapy are one component of a multi-disciplinary discharge planning process, led by the attending physician.  Recommendations may be updated based on patient status, additional functional criteria and insurance authorization.    Recommendations  Diet recommendations: Dysphagia 3 (mechanical soft);Thin liquid Liquids provided via: Cup;Straw Medication Administration: Whole meds with puree Supervision: Full supervision/cueing for compensatory strategies;Trained caregiver to feed patient Compensations: Slow rate;Small sips/bites;Minimize environmental distractions Postural Changes and/or Swallow Maneuvers: Seated upright 90 degrees;Upright 30-60 min after meal                   Oral care BID;Staff/trained caregiver to provide oral care   Frequent or constant Supervision/Assistance Dysphagia, unspecified (R13.10)     All goals met     Gwynneth Aliment, M.A., CF-SLP Speech Language Pathology, Acute Rehabilitation Services  Secure Chat preferred [phone]   03/03/2023, 10:17 AM

## 2023-03-04 ENCOUNTER — Inpatient Hospital Stay (HOSPITAL_COMMUNITY): Payer: 59

## 2023-03-04 DIAGNOSIS — R410 Disorientation, unspecified: Secondary | ICD-10-CM | POA: Diagnosis not present

## 2023-03-04 MED ORDER — HYDRALAZINE HCL 50 MG PO TABS
50.0000 mg | ORAL_TABLET | Freq: Three times a day (TID) | ORAL | Status: DC
  Administered 2023-03-04 – 2023-03-15 (×24): 50 mg via ORAL
  Filled 2023-03-04 (×35): qty 1

## 2023-03-04 NOTE — Plan of Care (Signed)
  Problem: Education: Goal: Knowledge of General Education information will improve Description: Including pain rating scale, medication(s)/side effects and non-pharmacologic comfort measures Outcome: Progressing   Problem: Health Behavior/Discharge Planning: Goal: Ability to manage health-related needs will improve Outcome: Progressing   Problem: Clinical Measurements: Goal: Ability to maintain clinical measurements within normal limits will improve Outcome: Progressing Goal: Will remain free from infection Outcome: Progressing Goal: Diagnostic test results will improve Outcome: Progressing Goal: Respiratory complications will improve Outcome: Progressing Goal: Cardiovascular complication will be avoided Outcome: Progressing   Problem: Activity: Goal: Risk for activity intolerance will decrease Outcome: Progressing   Problem: Nutrition: Goal: Adequate nutrition will be maintained Outcome: Progressing   Problem: Coping: Goal: Level of anxiety will decrease Outcome: Progressing   Problem: Elimination: Goal: Will not experience complications related to bowel motility Outcome: Progressing Goal: Will not experience complications related to urinary retention Outcome: Progressing   Problem: Pain Managment: Goal: General experience of comfort will improve Outcome: Progressing   Problem: Safety: Goal: Ability to remain free from injury will improve Outcome: Progressing   Problem: Skin Integrity: Goal: Risk for impaired skin integrity will decrease Outcome: Progressing   Problem: Education: Goal: Knowledge of General Education information will improve Description: Including pain rating scale, medication(s)/side effects and non-pharmacologic comfort measures Outcome: Progressing   Problem: Health Behavior/Discharge Planning: Goal: Ability to manage health-related needs will improve Outcome: Progressing   Problem: Clinical Measurements: Goal: Ability to maintain  clinical measurements within normal limits will improve Outcome: Progressing   Problem: Clinical Measurements: Goal: Will remain free from infection Outcome: Progressing   Problem: Clinical Measurements: Goal: Diagnostic test results will improve Outcome: Progressing   Problem: Clinical Measurements: Goal: Respiratory complications will improve Outcome: Progressing   Problem: Clinical Measurements: Goal: Respiratory complications will improve Outcome: Progressing   Problem: Clinical Measurements: Goal: Cardiovascular complication will be avoided Outcome: Progressing   Problem: Activity: Goal: Risk for activity intolerance will decrease Outcome: Progressing   Problem: Nutrition: Goal: Adequate nutrition will be maintained Outcome: Progressing   Problem: Safety: Goal: Ability to remain free from injury will improve Outcome: Progressing   Problem: Pain Managment: Goal: General experience of comfort will improve Outcome: Progressing   Problem: Skin Integrity: Goal: Risk for impaired skin integrity will decrease Outcome: Progressing

## 2023-03-04 NOTE — Progress Notes (Signed)
PROGRESS NOTE                                                                                                                                                                                                             Patient Demographics:    Dennis [name], is a 77 y.o. male, DOB - [birth year], GLO:[id number]  Outpatient Primary MD for the patient is Pcp, No    LOS - 10  Admit date - 02/22/2023    Chief Complaint  Patient presents with   Altered Mental Status       Brief Narrative (HPI from H&P)   77 year old male with past medical history of HLD, BPH, HTN, rheumatoid arthritis, COPD, Barrett's esophagus, OSA does not use CPAP, history of CVA. Patient sent in for altered mentation. At baseline patient has a ataxic gait but is not confused. Over the course of the past week he has become increasingly confused. He was brought to the ER.  In the ER he was trying to escape from the ER room had to be IVC need placed with 1 is to 1 sitter, had 2 CT scans of the head which were unremarkable, he was admitted for further workup of encephalopathy.   Subjective:   Patient in bed awake but confused, denies any headache or chest pain, still pleasantly confused   Assessment  & Plan :   Gradually progressive metabolic encephalopathy over the course of 5 to 7 days.  Brought in by wife for worsening confusion, confusion persisted in the ER, CT head x 2 several hours apart is nonacute, no focal deficits apparent. He was afebrile, no leukocytosis, CRP and ESR are stable, head CT x 2 stable, ABG stable, RPR stable, ammonia stable, stable B12, TSH, A1c, MRI and EEG x2, LP done after much effort by IR under fluoroscopy on 02/26/2023 rules out bacterial meningitis, CSF findings discussed with neurology and ID physician Dr. Algis Liming, not consistent with any meningitis encephalitis all antibiotics and acyclovir stopped 02/27/2023.  Talking to the neighbors it  has become clear that patient has had gradual downward decline which sounds like dementia for several months, unfortunately wife is quite demented as well, Adult Protective Services has been involved in the past and has been involved again now.  He has a very poor baseline which has been gradually declining over several months due  to underlying undiagnosed dementia.  Will require placement, looking for a bed medically stable.  Patient has been in restraints for 10 days straight telemetry sitter, will try and place a bedside sitter hopes of discontinuing physical restraints to see if his mental status will improve sitting up in recliner close to the window and exposure to sunlight.   Dehydration, reduced urine output, AKI.  Poor oral intake likely due to confusion as above, hold ARB, hydrate again with IV fluids on 03/03/2023.  Bladder scan has 53 cc of urine.  Continue to monitor bladder scan intermittently.  Repeat BMP tomorrow.  History of rheumatoid arthritis.  Post discharge resume methotrexate and sulfasalazine.  Stable CRP ESR.  BPH.  Proscar and Flomax if he can tolerate oral medications.  Hypertension.  He is on combination of Coreg, Imdur, hydralazine dose increased on 03/04/2023 for better control, as needed IV hydralazine on board as well.  Dyslipidemia.  Check a.m. lipid panel.  Nonspecific EKG changes.  Denies any chest pain.  Coreg and statin, noted with EF slightly depressed and some wall motion abnormalities, have to hold antiplatelets due to pending LP.  Troponin trend was stable and in non-ACS pattern, outpatient cardiology follow-up not a candidate for any invasive procedure or testing due to underlying advanced dementia and poor functional status.  Will optimize medical care.  Continue Coreg, statin, and home dose Plavix.       Condition - Extremely Guarded  Family Communication  : Called wife [name] [phone] twice on 02/23/2023 at 8 AM and 10 AM, no response, message left  on answering machine on her cell phone.  Wife informs me that her cell phone is broken, discussed with her in detail in room on 02/23/2023.  [name] (Neighbor) [phone] (Mobile) was updated message left morning of 02/24/2023 at 10:04 AM, called again afternoon of 02/24/2023, call 02/25/2023 at 10:22 AM message left, updated neighbor on 02/26/2023, 02/27/2023  Called on 02/25/2023 [phone]  at 11 AM over 20 rings, no response   Code Status :  Full  Consults  :  Neuro  PUD Prophylaxis :  PPI   Procedures  :     LP - CSF not consistent with any infection  Echocardiogram.  1. Left ventricular ejection fraction, by estimation, is 45 to 50%. Left ventricular ejection fraction by 2D MOD biplane is 49.5 %. The left ventricle has mildly decreased function. The left ventricle demonstrates regional wall motion abnormalities (see  scoring diagram/findings for description). There is mild left ventricular hypertrophy. Left ventricular diastolic function could not be evaluated.  2. Right ventricular systolic function is normal. The right ventricular size is normal.  3. The mitral valve is normal in structure. No evidence of mitral valve regurgitation. No evidence of mitral stenosis.  4. The aortic valve is normal in structure. There is moderate calcification of the aortic valve. There is moderate thickening of the aortic valve. Aortic valve regurgitation is trivial. No aortic stenosis is present.  5. The inferior vena cava is normal in size with greater than 50% respiratory variability, suggesting right atrial pressure of 3 mmHg.   CT head x 2.  Nonacute.    MRI brain.  1. No acute intracranial abnormality. 2. Moderate generalized atrophy and white matter disease likely reflects the sequela of chronic microvascular ischemia. 3. Remote lacunar infarct of the right thalamus. 4. Bilateral mastoid effusions. No obstructing nasopharyngeal lesion is present  EEG.  No active seizures      Disposition  Plan  :  Status is: Inpatient  DVT Prophylaxis  :    Place and maintain sequential compression device Start: 02/23/23 1015    Lab Results  Component Value Date   PLT 286 03/03/2023    Diet :  Diet Order             DIET DYS 3 Room service appropriate? No; Fluid consistency: Thin  Diet effective now                    Inpatient Medications  Scheduled Meds:  atorvastatin  40 mg Oral Daily   carvedilol  3.125 mg Oral BID WC   clopidogrel  75 mg Oral Daily   finasteride  5 mg Oral Daily   folic acid  1 mg Oral Daily   hydrALAZINE  50 mg Oral Q8H   isosorbide mononitrate  30 mg Oral Daily   pantoprazole  40 mg Oral Daily   tamsulosin  0.4 mg Oral QPC supper   Continuous Infusions:    PRN Meds:.acetaminophen **OR** acetaminophen, haloperidol lactate, hydrALAZINE, ondansetron **OR** ondansetron (ZOFRAN) IV, senna-docusate     Objective:   Vitals:   03/03/23 2306 03/04/23 0304 03/04/23 0542 03/04/23 0817  BP: (!) 188/96 (!) 141/78 135/76 (!) 163/76  Pulse: 99 87  83  Resp: 20     Temp: 98.4 F (36.9 C) 98.3 F (36.8 C)  98.6 F (37 C)  TempSrc: Oral Oral  Oral  SpO2: 96% 95%  95%  Weight:      Height:        Wt Readings from Last 3 Encounters:  02/22/23 68 kg  02/21/23 68 kg  12/25/22 68 kg     Intake/Output Summary (Last 24 hours) at 03/04/2023 0957 Last data filed at 03/04/2023 0100 Gross per 24 hour  Intake 240 ml  Output 2000 ml  Net -1760 ml      Physical Exam  In bed awake but remains confused, supple neck no focal deficits [place].AT,PERRAL Supple Neck, No JVD,   Symmetrical Chest wall movement, Good air movement bilaterally, CTAB RRR,No Gallops,Rubs or new Murmurs,  +ve B.Sounds, Abd Soft, No tenderness,   No Cyanosis, Clubbing or edema        Data Review:    Recent Labs  Lab 02/26/23 0554 02/27/23 0553 02/28/23 0350 03/01/23 0245 03/03/23 0313  WBC 8.6 8.0 10.1 8.9 8.4  HGB 11.7* 11.0* 12.9* 11.5* 10.6*  HCT 35.1*  34.8* 39.6 35.4* 32.8*  PLT 224 211 252 219 286  MCV 89.3 89.5 88.6 88.3 89.1  MCH 29.8 28.3 28.9 28.7 28.8  MCHC 33.3 31.6 32.6 32.5 32.3  RDW 14.1 14.1 14.0 14.3 14.6  LYMPHSABS 1.9 1.4 1.6 2.0 1.9  MONOABS 0.9 0.8 1.1* 1.1* 0.9  EOSABS 0.1 0.2 0.1 0.1 0.2  BASOSABS 0.1 0.0 0.0 0.0 0.0    Recent Labs  Lab 02/26/23 0340 02/26/23 0554 02/27/23 0553 02/28/23 0350 03/01/23 0245 03/03/23 0313  NA  --  140 139 138 138 138  K  --  3.7 3.5 3.4* 3.9 3.9  CL  --  107 107 104 108 107  CO2  --  24 24 25 23 23   ANIONGAP  --  9 8 9 7 8   GLUCOSE  --  87 83 95 88 105*  BUN  --  10 11 8 8 18   CREATININE  --  1.24 0.97 0.93 0.86 1.39*  AST  --  32 22 24  --   --   ALT  --  14 15 15   --   --   ALKPHOS  --  27* 27* 31*  --   --   BILITOT  --  0.4 0.8 0.7  --   --   ALBUMIN  --  2.5* 2.3* 2.7*  --   --   CRP 4.3*  --  3.3*  --   --   --   PROCALCITON  --  <0.10 <0.10 <0.10  --   --   BNP  --  965.2* 470.3*  --  1,535.3* 489.2*  MG  --  1.9 1.9  --  1.8 1.8  CALCIUM  --  8.6* 8.4* 8.8* 8.9 8.9      Recent Labs  Lab 02/26/23 0340 02/26/23 0554 02/27/23 0553 02/28/23 0350 03/01/23 0245 03/03/23 0313  CRP 4.3*  --  3.3*  --   --   --   PROCALCITON  --  <0.10 <0.10 <0.10  --   --   BNP  --  965.2* 470.3*  --  1,535.3* 489.2*  MG  --  1.9 1.9  --  1.8 1.8  CALCIUM  --  8.6* 8.4* 8.8* 8.9 8.9    --------------------------------------------------------------------------------------------------------------- Lab Results  Component Value Date   CHOL 152 02/24/2023   HDL 28 (L) 02/24/2023   LDLCALC 105 (H) 02/24/2023   TRIG 94 02/24/2023   CHOLHDL 5.4 02/24/2023    Lab Results  Component Value Date   HGBA1C 5.5 02/23/2023      Micro Results Recent Results (from the past 240 hour(s))  MRSA Next Gen by PCR, Nasal     Status: None   Collection Time: 02/23/23  3:31 AM   Specimen: Nasal Mucosa; Nasal Swab  Result Value Ref Range Status   MRSA by PCR Next Gen NOT DETECTED NOT  DETECTED Final    Comment: (NOTE) The GeneXpert MRSA Assay (FDA approved for NASAL specimens only), is one component of a comprehensive MRSA colonization surveillance program. It is not intended to diagnose MRSA infection nor to guide or monitor treatment for MRSA infections. Test performance is not FDA approved in patients less than 69 years old. Performed at [facility], [address] [place], [place] [address]   CSF culture w Gram Stain     Status: None   Collection Time: 02/24/23  2:18 PM   Specimen: CSF; Cerebrospinal Fluid  Result Value Ref Range Status   Specimen Description CSF  Final   Special Requests NONE  Final   Gram Stain   Final    WBC PRESENT,BOTH PMN AND MONONUCLEAR NO ORGANISMS SEEN CYTOSPIN SMEAR    Culture   Final    NO GROWTH 3 DAYS Performed at [facility], [address] [place], [place] [address]    Report Status 03/01/2023 FINAL  Final    Radiology Reports DG Chest Port 1 View  Result Date: 03/01/2023 CLINICAL DATA:  77 year old male with history of shortness of breath. EXAM: PORTABLE CHEST 1 VIEW COMPARISON:  Chest x-ray 02/27/2023. FINDINGS: Mild elevation of the left hemidiaphragm. Slight obscuration of the lateral aspect of the left hemidiaphragm focus of atelectasis and/or consolidation. Minimal subsegmental atelectasis or scarring in the periphery of the right lung base. Diffuse interstitial prominence and widespread peribronchial cuffing. No pleural effusions. No pneumothorax. No evidence of pulmonary edema. Heart size is borderline enlarged. The patient is rotated to the left on today's exam, resulting in distortion of the mediastinal contours and reduced diagnostic sensitivity and specificity for mediastinal  pathology. Atherosclerotic calcifications are noted in the thoracic aorta. IMPRESSION: 1. Previously noted opacity in the right upper lobe is no longer evident. Persistent opacity at the left base favored to reflect atelectasis or  consolidation. 2. Diffuse interstitial prominence and widespread peribronchial cuffing suggestive of chronic bronchitis, similar to prior studies. 3. Aortic atherosclerosis. Electronically Signed   By: Trudie Reed M.D.   On: 03/01/2023 08:46      Signature  -   Susa Raring M.D on 03/04/2023 at 9:57 AM   -  To page go to [email or web address]

## 2023-03-04 NOTE — Progress Notes (Signed)
OT Cancellation Note  Patient Details Name: Dennis [name] MRN: [id number] DOB: 11-27-1945   Cancelled Treatment:    Reason Eval/Treat Not Completed: Fatigue/lethargy limiting ability to participate (pt sleeping, unable to rouse despite maximal effort)  [name] 03/04/2023, 3:27 PM

## 2023-03-05 DIAGNOSIS — R410 Disorientation, unspecified: Secondary | ICD-10-CM | POA: Diagnosis not present

## 2023-03-05 LAB — GLUCOSE, CAPILLARY: Glucose-Capillary: 129 mg/dL — ABNORMAL HIGH (ref 70–99)

## 2023-03-05 LAB — COMPREHENSIVE METABOLIC PANEL
ALT: 14 U/L (ref 0–44)
AST: 19 U/L (ref 15–41)
Albumin: 2.3 g/dL — ABNORMAL LOW (ref 3.5–5.0)
Alkaline Phosphatase: 30 U/L — ABNORMAL LOW (ref 38–126)
Anion gap: 11 (ref 5–15)
BUN: 14 mg/dL (ref 8–23)
CO2: 24 mmol/L (ref 22–32)
Calcium: 9.2 mg/dL (ref 8.9–10.3)
Chloride: 103 mmol/L (ref 98–111)
Creatinine, Ser: 1.07 mg/dL (ref 0.61–1.24)
GFR, Estimated: >60 mL/min (ref >60)
Glucose, Bld: 90 mg/dL (ref 70–99)
Potassium: 3.4 mmol/L — ABNORMAL LOW (ref 3.5–5.1)
Sodium: 138 mmol/L (ref 135–145)
Total Bilirubin: 0.6 mg/dL (ref 0.3–1.2)
Total Protein: 5.6 g/dL — ABNORMAL LOW (ref 6.5–8.1)

## 2023-03-05 LAB — CBC WITH DIFFERENTIAL/PLATELET
Abs Immature Granulocytes: 0.04 10*3/uL (ref 0.00–0.07)
Basophils Absolute: 0.1 10*3/uL (ref 0.0–0.1)
Basophils Relative: 1 %
Eosinophils Absolute: 0.2 10*3/uL (ref 0.0–0.5)
Eosinophils Relative: 3 %
HCT: 34 % — ABNORMAL LOW (ref 39.0–52.0)
Hemoglobin: 11.1 g/dL — ABNORMAL LOW (ref 13.0–17.0)
Immature Granulocytes: 1 %
Lymphocytes Relative: 25 %
Lymphs Abs: 2.1 10*3/uL (ref 0.7–4.0)
MCH: 29.1 pg (ref 26.0–34.0)
MCHC: 32.6 g/dL (ref 30.0–36.0)
MCV: 89.2 fL (ref 80.0–100.0)
Monocytes Absolute: 0.9 10*3/uL (ref 0.1–1.0)
Monocytes Relative: 11 %
Neutro Abs: 5 10*3/uL (ref 1.7–7.7)
Neutrophils Relative %: 59 %
Platelets: 317 10*3/uL (ref 150–400)
RBC: 3.81 MIL/uL — ABNORMAL LOW (ref 4.22–5.81)
RDW: 14.6 % (ref 11.5–15.5)
WBC: 8.3 10*3/uL (ref 4.0–10.5)
nRBC: 0 % (ref 0.0–0.2)

## 2023-03-05 LAB — PHOSPHORUS: Phosphorus: 2.8 mg/dL (ref 2.5–4.6)

## 2023-03-05 LAB — MAGNESIUM: Magnesium: 1.7 mg/dL (ref 1.7–2.4)

## 2023-03-05 NOTE — TOC Progression Note (Signed)
Transition of Care [facility]) - Progression Note    Patient Details  Name: Dennis [name] MRN: [id number] Date of Birth: Feb 12, 1946  [facility]) CM/SW Contact  Mearl Latin, LCSW Phone Number: 03/05/2023, 3:46 PM  Clinical Narrative:    2pm-CSW spoke with patient's APS worker, Arlina Robes. She stated she received a report on patient's significant other so she is back involved in both cases and may need to petition the court for Guardianship of both if they find the significant other will be unable to cognitively sign paperwork for patient. CSW updated Tammy with [facility]. Nichole is on her way to the hospital to speak with significant other at bedside.    Expected Discharge Plan: Skilled Nursing Facility Barriers to Discharge: Continued Medical Work up, Requiring sitter/restraints, SNF Pending bed offer, Unsafe home situation  Expected Discharge Plan and Services In-house Referral: Clinical Social Work   Post Acute Care Choice: Skilled Nursing Facility Living arrangements for the past 2 months: Single Family Home                                       Social Determinants of Health (SDOH) Interventions SDOH Screenings   Food Insecurity: No Food Insecurity (07/26/2020)   Received from [facility], [facility]  Transportation Needs: No Transportation Needs (04/04/2022)   Received from [facility], [facility]  Tobacco Use: Medium Risk (02/22/2023)    Readmission Risk Interventions     No data to display

## 2023-03-05 NOTE — Progress Notes (Signed)
PROGRESS NOTE                                                                                                                                                                                                             Patient Demographics:    Dennis [name], is a 77 y.o. male, DOB - 1945/12/12, EXB:[id number]  Outpatient Primary MD for the patient is Pcp, No    LOS - 11  Admit date - 02/22/2023    Chief Complaint  Patient presents with   Altered Mental Status       Brief Narrative (HPI from H&P)   77 year old male with past medical history of HLD, BPH, HTN, rheumatoid arthritis, COPD, Barrett's esophagus, OSA does not use CPAP, history of CVA. Patient sent in for altered mentation. At baseline patient has a ataxic gait but is not confused. Over the course of the past week he has become increasingly confused. He was brought to the ER.  In the ER he was trying to escape from the ER room had to be IVC need placed with 1 is to 1 sitter, had 2 CT scans of the head which were unremarkable, he was admitted for further workup of encephalopathy.   Subjective:   Patient denies any complaints today, no significant events as discussed with staff,   Assessment  & Plan :   Gradually progressive metabolic encephalopathy over the course of 5 to 7 days.  Brought in by wife for worsening confusion, confusion persisted in the ER, CT head x 2 several hours apart is nonacute, no focal deficits apparent. He was afebrile, no leukocytosis, CRP and ESR are stable, head CT x 2 stable, ABG stable, RPR stable, ammonia stable, stable B12, TSH, A1c, MRI and EEG x2, LP done after much effort by IR under fluoroscopy on 02/26/2023 rules out bacterial meningitis, CSF findings discussed with neurology and ID physician Dr. Algis Liming, not consistent with any meningitis encephalitis all antibiotics and acyclovir stopped 02/27/2023.  Talking to the neighbors it has become  clear that patient has had gradual downward decline which sounds like dementia for several months, unfortunately wife is quite demented as well, Adult Protective Services has been involved in the past and has been involved again now.  He has a very poor baseline which has been gradually declining over several months due to underlying undiagnosed  dementia.  Will require placement, looking for a bed medically stable.  Patient has been in restraints for 10 days straight telemetry sitter, will try and place a bedside sitter hopes of discontinuing physical restraints to see if his mental status will improve sitting up in recliner close to the window and exposure to sunlight.   Dehydration, reduced urine output, AKI.  Poor oral intake likely due to confusion as above, hold ARB, hydrate again with IV fluids on 03/03/2023.  Bladder scan has 53 cc of urine.  Continue to monitor bladder scan intermittently.  Repeat BMP tomorrow.  History of rheumatoid arthritis.  Post discharge resume methotrexate and sulfasalazine.  Stable CRP ESR.  BPH.  Proscar and Flomax if he can tolerate oral medications.  Hypertension.  He is on combination of Coreg, Imdur, hydralazine dose increased on 03/04/2023 for better control, as needed IV hydralazine on board as well.  Dyslipidemia.  Check a.m. lipid panel.  Nonspecific EKG changes.  Denies any chest pain.  Coreg and statin, noted with EF slightly depressed and some wall motion abnormalities, have to hold antiplatelets due to pending LP.  Troponin trend was stable and in non-ACS pattern, outpatient cardiology follow-up not a candidate for any invasive procedure or testing due to underlying advanced dementia and poor functional status.  Will optimize medical care.  Continue Coreg, statin, and home dose Plavix.        Family Communication  : I have discussed with wife by the phone as she called when I was examining the patient, I have updated her about his condition     Code Status :  Full  Consults  :  Neuro  PUD Prophylaxis :  PPI   Procedures  :     LP - CSF not consistent with any infection  Echocardiogram.  1. Left ventricular ejection fraction, by estimation, is 45 to 50%. Left ventricular ejection fraction by 2D MOD biplane is 49.5 %. The left ventricle has mildly decreased function. The left ventricle demonstrates regional wall motion abnormalities (see  scoring diagram/findings for description). There is mild left ventricular hypertrophy. Left ventricular diastolic function could not be evaluated.  2. Right ventricular systolic function is normal. The right ventricular size is normal.  3. The mitral valve is normal in structure. No evidence of mitral valve regurgitation. No evidence of mitral stenosis.  4. The aortic valve is normal in structure. There is moderate calcification of the aortic valve. There is moderate thickening of the aortic valve. Aortic valve regurgitation is trivial. No aortic stenosis is present.  5. The inferior vena cava is normal in size with greater than 50% respiratory variability, suggesting right atrial pressure of 3 mmHg.   CT head x 2.  Nonacute.    MRI brain.  1. No acute intracranial abnormality. 2. Moderate generalized atrophy and white matter disease likely reflects the sequela of chronic microvascular ischemia. 3. Remote lacunar infarct of the right thalamus. 4. Bilateral mastoid effusions. No obstructing nasopharyngeal lesion is present  EEG.  No active seizures      Disposition Plan  :    Status is: Inpatient  DVT Prophylaxis  :    Place and maintain sequential compression device Start: 02/23/23 1015    Lab Results  Component Value Date   PLT 317 03/05/2023    Diet :  Diet Order             DIET DYS 3 Room service appropriate? No; Fluid consistency: Thin  Diet effective now  Inpatient Medications  Scheduled Meds:  atorvastatin  40 mg Oral Daily   carvedilol  3.125  mg Oral BID WC   clopidogrel  75 mg Oral Daily   finasteride  5 mg Oral Daily   folic acid  1 mg Oral Daily   hydrALAZINE  50 mg Oral Q8H   isosorbide mononitrate  30 mg Oral Daily   pantoprazole  40 mg Oral Daily   tamsulosin  0.4 mg Oral QPC supper   Continuous Infusions:    PRN Meds:.acetaminophen **OR** acetaminophen, haloperidol lactate, hydrALAZINE, ondansetron **OR** ondansetron (ZOFRAN) IV, senna-docusate     Objective:   Vitals:   03/05/23 0435 03/05/23 0538 03/05/23 0815 03/05/23 1150  BP: 134/60 131/63 137/75 98/67  Pulse: 72  94   Resp:   18 19  Temp: 98.7 F (37.1 C)  98.6 F (37 C) 98.6 F (37 C)  TempSrc:   Oral Oral  SpO2: 96%  96%   Weight:      Height:        Wt Readings from Last 3 Encounters:  02/22/23 68 kg  02/21/23 68 kg  12/25/22 68 kg     Intake/Output Summary (Last 24 hours) at 03/05/2023 1456 Last data filed at 03/05/2023 1610 Gross per 24 hour  Intake 240 ml  Output --  Net 240 ml      Physical Exam  In bed awake but remains confused, supple neck no focal deficits Symmetrical Chest wall movement, Good air movement bilaterally, CTAB RRR,No Gallops,Rubs or new Murmurs, No Parasternal Heave +ve B.Sounds, Abd Soft, No tenderness, No rebound - guarding or rigidity. No Cyanosis, Clubbing or edema, No new Rash or bruise       Data Review:    Recent Labs  Lab 02/27/23 0553 02/28/23 0350 03/01/23 0245 03/03/23 0313 03/05/23 0338  WBC 8.0 10.1 8.9 8.4 8.3  HGB 11.0* 12.9* 11.5* 10.6* 11.1*  HCT 34.8* 39.6 35.4* 32.8* 34.0*  PLT 211 252 219 286 317  MCV 89.5 88.6 88.3 89.1 89.2  MCH 28.3 28.9 28.7 28.8 29.1  MCHC 31.6 32.6 32.5 32.3 32.6  RDW 14.1 14.0 14.3 14.6 14.6  LYMPHSABS 1.4 1.6 2.0 1.9 2.1  MONOABS 0.8 1.1* 1.1* 0.9 0.9  EOSABS 0.2 0.1 0.1 0.2 0.2  BASOSABS 0.0 0.0 0.0 0.0 0.1    Recent Labs  Lab 02/27/23 0553 02/28/23 0350 03/01/23 0245 03/03/23 0313 03/05/23 0338  NA 139 138 138 138 138  K 3.5 3.4*  3.9 3.9 3.4*  CL 107 104 108 107 103  CO2 24 25 23 23 24   ANIONGAP 8 9 7 8 11   GLUCOSE 83 95 88 105* 90  BUN 11 8 8 18 14   CREATININE 0.97 0.93 0.86 1.39* 1.07  AST 22 24  --   --  19  ALT 15 15  --   --  14  ALKPHOS 27* 31*  --   --  30*  BILITOT 0.8 0.7  --   --  0.6  ALBUMIN 2.3* 2.7*  --   --  2.3*  CRP 3.3*  --   --   --   --   PROCALCITON <0.10 <0.10  --   --   --   BNP 470.3*  --  1,535.3* 489.2*  --   MG 1.9  --  1.8 1.8 1.7  CALCIUM 8.4* 8.8* 8.9 8.9 9.2      Recent Labs  Lab 02/27/23 0553 02/28/23 0350 03/01/23 0245 03/03/23 0313 03/05/23 9604  CRP 3.3*  --   --   --   --   PROCALCITON <0.10 <0.10  --   --   --   BNP 470.3*  --  1,535.3* 489.2*  --   MG 1.9  --  1.8 1.8 1.7  CALCIUM 8.4* 8.8* 8.9 8.9 9.2    --------------------------------------------------------------------------------------------------------------- Lab Results  Component Value Date   CHOL 152 02/24/2023   HDL 28 (L) 02/24/2023   LDLCALC 105 (H) 02/24/2023   TRIG 94 02/24/2023   CHOLHDL 5.4 02/24/2023    Lab Results  Component Value Date   HGBA1C 5.5 02/23/2023      Micro Results Recent Results (from the past 240 hour(s))  CSF culture w Gram Stain     Status: None   Collection Time: 02/24/23  2:18 PM   Specimen: CSF; Cerebrospinal Fluid  Result Value Ref Range Status   Specimen Description CSF  Final   Special Requests NONE  Final   Gram Stain   Final    WBC PRESENT,BOTH PMN AND MONONUCLEAR NO ORGANISMS SEEN CYTOSPIN SMEAR    Culture   Final    NO GROWTH 3 DAYS Performed at [facility], [address], [place], [place] [address]    Report Status 03/01/2023 FINAL  Final    Radiology Reports DG Chest Port 1 View  Result Date: 03/04/2023 CLINICAL DATA:  141880 SOB (shortness of breath) 141880 EXAM: PORTABLE CHEST 1 VIEW COMPARISON:  Today's prior FINDINGS: The heart appears near the upper limit of normal. Improved aeration of the left lower lobe. No lobar  consolidation is evident on today's exam. No pleural effusion. No pneumothorax. Similar coarse interstitial densities throughout the lungs. No acute osseous abnormality. IMPRESSION: Improvement in left lower lobe aeration. No new acute focal pulmonary process identified in the chest. Electronically Signed   By: Olive Bass M.D.   On: 03/04/2023 10:13      Signature  -   Mliss Fritz Hubert Raatz M.D on 03/05/2023 at 2:56 PM   -  To page go to [email or web address]

## 2023-03-06 DIAGNOSIS — R410 Disorientation, unspecified: Secondary | ICD-10-CM | POA: Diagnosis not present

## 2023-03-06 NOTE — Plan of Care (Signed)

## 2023-03-06 NOTE — Progress Notes (Signed)
PROGRESS NOTE                                                                                                                                                                                                             Patient Demographics:    Dennis [name], is a 77 y.o. male, DOB - Dec 27, 1945, WUJ:[id number]  Outpatient Primary MD for the patient is Pcp, No    LOS - 12  Admit date - 02/22/2023    Chief Complaint  Patient presents with   Altered Mental Status       Brief Narrative (HPI from H&P)   77 year old male with past medical history of HLD, BPH, HTN, rheumatoid arthritis, COPD, Barrett's esophagus, OSA does not use CPAP, history of CVA. Patient sent in for altered mentation. At baseline patient has a ataxic gait but is not confused. Over the course of the past week he has become increasingly confused. He was brought to the ER.  In the ER he was trying to escape from the ER room had to be IVC need placed with 1 is to 1 sitter, had 2 CT scans of the head which were unremarkable, he was admitted for further workup of encephalopathy.   Subjective:   Patient denies any complaints today, no significant events as discussed with staff,   Assessment  & Plan :   Gradually progressive metabolic encephalopathy over the course of 5 to 7 days.  Brought in by wife for worsening confusion, confusion persisted in the ER, CT head x 2 several hours apart is nonacute, no focal deficits apparent. He was afebrile, no leukocytosis, CRP and ESR are stable, head CT x 2 stable, ABG stable, RPR stable, ammonia stable, stable B12, TSH, A1c, MRI and EEG x2, LP done after much effort by IR under fluoroscopy on 02/26/2023 rules out bacterial meningitis, CSF findings discussed with neurology and ID physician Dr. Algis Liming, not consistent with any meningitis encephalitis all antibiotics and acyclovir stopped 02/27/2023.  Talking to the neighbors it has become  clear that patient has had gradual downward decline which sounds like dementia for several months, unfortunately wife is quite demented as well, Adult Protective Services has been involved in the past and has been involved again now.  He has a very poor baseline which has been gradually declining over several months due to underlying undiagnosed  dementia.  Will require placement, looking for a bed medically stable.  Patient has been in restraints for 10 days straight telemetry sitter, will try and place a bedside sitter hopes of discontinuing physical restraints to see if his mental status will improve sitting up in recliner close to the window and exposure to sunlight.   Dehydration, reduced urine output, AKI.  Poor oral intake likely due to confusion as above, hold ARB, hydrate again with IV fluids on 03/03/2023.  Bladder scan has 53 cc of urine.  Continue to monitor bladder scan intermittently.  Repeat BMP tomorrow.  History of rheumatoid arthritis.  Post discharge resume methotrexate and sulfasalazine.  Stable CRP ESR.  BPH.  Proscar and Flomax if he can tolerate oral medications.  Hypertension.  He is on combination of Coreg, Imdur, hydralazine dose increased on 03/04/2023 for better control, as needed IV hydralazine on board as well.  Dyslipidemia.  Check a.m. lipid panel.  Nonspecific EKG changes.  Denies any chest pain.  Coreg and statin, noted with EF slightly depressed and some wall motion abnormalities, have to hold antiplatelets due to pending LP.  Troponin trend was stable and in non-ACS pattern, outpatient cardiology follow-up not a candidate for any invasive procedure or testing due to underlying advanced dementia and poor functional status.  Will optimize medical care.  Continue Coreg, statin, and home dose Plavix.        Family Communication  : None at bedside   Code Status :  Full  Consults  :  Neuro  PUD Prophylaxis :  PPI   Procedures  :     LP - CSF not consistent  with any infection  Echocardiogram.  1. Left ventricular ejection fraction, by estimation, is 45 to 50%. Left ventricular ejection fraction by 2D MOD biplane is 49.5 %. The left ventricle has mildly decreased function. The left ventricle demonstrates regional wall motion abnormalities (see  scoring diagram/findings for description). There is mild left ventricular hypertrophy. Left ventricular diastolic function could not be evaluated.  2. Right ventricular systolic function is normal. The right ventricular size is normal.  3. The mitral valve is normal in structure. No evidence of mitral valve regurgitation. No evidence of mitral stenosis.  4. The aortic valve is normal in structure. There is moderate calcification of the aortic valve. There is moderate thickening of the aortic valve. Aortic valve regurgitation is trivial. No aortic stenosis is present.  5. The inferior vena cava is normal in size with greater than 50% respiratory variability, suggesting right atrial pressure of 3 mmHg.   CT head x 2.  Nonacute.    MRI brain.  1. No acute intracranial abnormality. 2. Moderate generalized atrophy and white matter disease likely reflects the sequela of chronic microvascular ischemia. 3. Remote lacunar infarct of the right thalamus. 4. Bilateral mastoid effusions. No obstructing nasopharyngeal lesion is present  EEG.  No active seizures      Disposition Plan  :    Status is: Inpatient  DVT Prophylaxis  :    Place and maintain sequential compression device Start: 02/23/23 1015    Lab Results  Component Value Date   PLT 317 03/05/2023    Diet :  Diet Order             DIET DYS 3 Room service appropriate? No; Fluid consistency: Thin  Diet effective now                    Inpatient Medications  Scheduled  Meds:  atorvastatin  40 mg Oral Daily   carvedilol  3.125 mg Oral BID WC   clopidogrel  75 mg Oral Daily   finasteride  5 mg Oral Daily   folic acid  1 mg Oral Daily    hydrALAZINE  50 mg Oral Q8H   isosorbide mononitrate  30 mg Oral Daily   pantoprazole  40 mg Oral Daily   tamsulosin  0.4 mg Oral QPC supper   Continuous Infusions:    PRN Meds:.acetaminophen **OR** acetaminophen, haloperidol lactate, hydrALAZINE, ondansetron **OR** ondansetron (ZOFRAN) IV, senna-docusate     Objective:   Vitals:   03/06/23 0000 03/06/23 0833 03/06/23 0840 03/06/23 1434  BP: 129/64 (!) 163/82 (!) 147/76 113/65  Pulse: 79  92 83  Resp: 17   19  Temp: 97.7 F (36.5 C)  97.9 F (36.6 C)   TempSrc: Oral  Oral   SpO2: 92%  95% 92%  Weight:      Height:        Wt Readings from Last 3 Encounters:  02/22/23 68 kg  02/21/23 68 kg  12/25/22 68 kg     Intake/Output Summary (Last 24 hours) at 03/06/2023 1607 Last data filed at 03/06/2023 0601 Gross per 24 hour  Intake --  Output 1000 ml  Net -1000 ml      Physical Exam  He is awake, significantly confused with impaired judgment and insight Symmetrical Chest wall movement, Good air movement bilaterally, CTAB RRR,No Gallops,Rubs or new Murmurs, No Parasternal Heave +ve B.Sounds, Abd Soft, No tenderness, No rebound - guarding or rigidity. No Cyanosis, Clubbing or edema, No new Rash or bruise       Data Review:    Recent Labs  Lab 02/28/23 0350 03/01/23 0245 03/03/23 0313 03/05/23 0338  WBC 10.1 8.9 8.4 8.3  HGB 12.9* 11.5* 10.6* 11.1*  HCT 39.6 35.4* 32.8* 34.0*  PLT 252 219 286 317  MCV 88.6 88.3 89.1 89.2  MCH 28.9 28.7 28.8 29.1  MCHC 32.6 32.5 32.3 32.6  RDW 14.0 14.3 14.6 14.6  LYMPHSABS 1.6 2.0 1.9 2.1  MONOABS 1.1* 1.1* 0.9 0.9  EOSABS 0.1 0.1 0.2 0.2  BASOSABS 0.0 0.0 0.0 0.1    Recent Labs  Lab 02/28/23 0350 03/01/23 0245 03/03/23 0313 03/05/23 0338  NA 138 138 138 138  K 3.4* 3.9 3.9 3.4*  CL 104 108 107 103  CO2 25 23 23 24   ANIONGAP 9 7 8 11   GLUCOSE 95 88 105* 90  BUN 8 8 18 14   CREATININE 0.93 0.86 1.39* 1.07  AST 24  --   --  19  ALT 15  --   --  14   ALKPHOS 31*  --   --  30*  BILITOT 0.7  --   --  0.6  ALBUMIN 2.7*  --   --  2.3*  PROCALCITON <0.10  --   --   --   BNP  --  1,535.3* 489.2*  --   MG  --  1.8 1.8 1.7  CALCIUM 8.8* 8.9 8.9 9.2      Recent Labs  Lab 02/28/23 0350 03/01/23 0245 03/03/23 0313 03/05/23 0338  PROCALCITON <0.10  --   --   --   BNP  --  1,535.3* 489.2*  --   MG  --  1.8 1.8 1.7  CALCIUM 8.8* 8.9 8.9 9.2    --------------------------------------------------------------------------------------------------------------- Lab Results  Component Value Date   CHOL 152 02/24/2023   HDL 28 (L)  02/24/2023   LDLCALC 105 (H) 02/24/2023   TRIG 94 02/24/2023   CHOLHDL 5.4 02/24/2023    Lab Results  Component Value Date   HGBA1C 5.5 02/23/2023      Micro Results No results found for this or any previous visit (from the past 240 hour(s)).   Radiology Reports DG Chest Port 1 View  Result Date: 03/04/2023 CLINICAL DATA:  141880 SOB (shortness of breath) 141880 EXAM: PORTABLE CHEST 1 VIEW COMPARISON:  Today's prior FINDINGS: The heart appears near the upper limit of normal. Improved aeration of the left lower lobe. No lobar consolidation is evident on today's exam. No pleural effusion. No pneumothorax. Similar coarse interstitial densities throughout the lungs. No acute osseous abnormality. IMPRESSION: Improvement in left lower lobe aeration. No new acute focal pulmonary process identified in the chest. Electronically Signed   By: Olive Bass M.D.   On: 03/04/2023 10:13      Signature  -   Mliss Fritz Gunnard Dorrance M.D on 03/06/2023 at 4:07 PM   -  To page go to [email or web address]

## 2023-03-06 NOTE — Progress Notes (Signed)
Physical Therapy Treatment Patient Details Name: Dennis [name] MRN: [id number] DOB: Jan 24, 1946 Today's Date: 03/06/2023   History of Present Illness 77 yo male presenting to ED 10/5 with AMS. CTH and MRI brain negative; undergoing long-term EEG and lumbar puncture pending. PMH includes HLD, BPH, HTN, rheumatoid arthritis, COPD, Barrett's esophagus, OSA, prior CVA,  dysarthria, LLE weakness, dementia.    PT Comments  Pt received in recliner, requesting assist to get up and use the bathroom, agreeable to therapy session and with improved standing/activity tolerance this date. Pt assisted to [facility] and NT present for hygiene assist, then pt able to perform household distance gait trial with consistent minA for RW management and stability, increased assist in narrow areas of the room and for wayfinding. Pt continues to benefit from PT services to progress toward functional mobility goals, continue to recommend short term lower intensity post-acute rehab upon DC, <3 hours/day.     If plan is discharge home, recommend the following: Assistance with cooking/housework;Assistance with feeding;Direct supervision/assist for medications management;Direct supervision/assist for financial management;Assist for transportation;Help with stairs or ramp for entrance;Supervision due to cognitive status;Two people to help with walking and/or transfers   Can travel by private vehicle     Yes  Equipment Recommendations  Other (comment) (TBD, will need to clarify with family/friend what he owns)    Recommendations for Other Services       Precautions / Restrictions Precautions Precautions: Fall Precaution Comments: tele sitter Restrictions Weight Bearing Restrictions: No     Mobility  Bed Mobility Overal bed mobility: Needs Assistance Bed Mobility: Supine to Sit, Sit to Supine     Supine to sit: HOB elevated, Used rails, Mod assist Sit to supine: Used rails, Min assist   General bed mobility comments:  Light BLE assist to return to supine, pt using bed rail. Able to laterally scoot/bridge hips when cued to get to middle of bed.    Transfers Overall transfer level: Needs assistance Equipment used: Rolling walker (2 wheels) Transfers: Sit to/from Stand, Bed to chair/wheelchair/BSC Sit to Stand: Min assist           General transfer comment: min A from chair>BSC and to stand from [facility] and sit EOB, pt needs multimodal cues with hand over hand assist to push from proper location on BSC when standing then cues to grasp RW.    Ambulation/Gait Ambulation/Gait assistance: Min assist Gait Distance (Feet): 85 Feet Assistive device: Rolling walker (2 wheels) Gait Pattern/deviations: Trunk flexed, Step-to pattern, Step-through pattern, Drifts right/left, Narrow base of support       General Gait Details: cues for RW proximity, improved upright posture, increased step length. Pt maintains kyphotic posture with forward head.   Stairs             Wheelchair Mobility     Tilt Bed    Modified Rankin (Stroke Patients Only)       Balance Overall balance assessment: Needs assistance Sitting-balance support: Feet supported, No upper extremity supported, Single extremity supported Sitting balance-Leahy Scale: Fair Sitting balance - Comments: sitting EOB unsupported no LOB   Standing balance support: Bilateral upper extremity supported Standing balance-Leahy Scale: Poor Standing balance comment: RW and external assist to manage AD.                            Cognition Arousal: Alert Behavior During Therapy: WFL for tasks assessed/performed Overall Cognitive Status: Impaired/Different from baseline Area of Impairment: Attention, Memory, Following  commands, Safety/judgement, Orientation, Awareness, Problem solving                 Orientation Level: Disoriented to, Time, Situation Current Attention Level: Sustained Memory: Decreased short-term memory, Decreased  recall of precautions Following Commands: Follows one step commands with increased time Safety/Judgement: Decreased awareness of safety, Decreased awareness of deficits Awareness: Intellectual Problem Solving: Slow processing, Requires verbal cues, Difficulty sequencing, Requires tactile cues General Comments: Pt with improved command following this date for simple commands and able to make his needs known (toileting need, activity tolerance, pain score, etc) and does not report any hallucinations today.        Exercises      General Comments General comments (skin integrity, edema, etc.): no acute s/sx distress; pt had significant BM, NT present and assisted with clean-up of BM and linen change      Pertinent Vitals/Pain Pain Assessment Breathing: normal Negative Vocalization: none Facial Expression: smiling or inexpressive Body Language: tense, distressed pacing, fidgeting Consolability: distracted or reassured by voice/touch PAINAD Score: 2 Pain Location: L hip/bottom from sitting 2.5 hours Pain Descriptors / Indicators: Discomfort, Guarding Pain Intervention(s): Monitored during session, Repositioned     PT Goals (current goals can now be found in the care plan section) Acute Rehab PT Goals Patient Stated Goal: To go home and to see my wife. PT Goal Formulation: Patient unable to participate in goal setting Time For Goal Achievement: 03/10/23 Progress towards PT goals: Progressing toward goals    Frequency    Min 3X/week      PT Plan         AM-PAC PT "6 Clicks" Mobility   Outcome Measure  Help needed turning from your back to your side while in a flat bed without using bedrails?: A Little Help needed moving from lying on your back to sitting on the side of a flat bed without using bedrails?: A Lot (without rails) Help needed moving to and from a bed to a chair (including a wheelchair)?: A Lot Help needed standing up from a chair using your arms (e.g.,  wheelchair or bedside chair)?: A Little Help needed to walk in hospital room?: A Lot (mod safety cues) Help needed climbing 3-5 steps with a railing? : Total 6 Click Score: 13    End of Session Equipment Utilized During Treatment: Gait belt Activity Tolerance: Patient tolerated treatment well Patient left: in bed;with call bell/phone within reach;with bed alarm set;Other (comment) (bed in chair posture, pt on telephone with wife who called at end of session.) Nurse Communication: Mobility status PT Visit Diagnosis: Unsteadiness on feet (R26.81);Other abnormalities of gait and mobility (R26.89);History of falling (Z91.81);Muscle weakness (generalized) (M62.81)     Time: 1610-9604 PT Time Calculation (min) (ACUTE ONLY): 36 min  Charges:    $Gait Training: 8-22 mins $Therapeutic Activity: 8-22 mins PT General Charges $$ ACUTE PT VISIT: 1 Visit                     Djeneba Barsch P., PTA Acute Rehabilitation Services Secure Chat Preferred 9a-5:30pm Office: [phone]    Dorathy Kinsman [facility] 03/06/2023, 2:26 PM

## 2023-03-07 DIAGNOSIS — R410 Disorientation, unspecified: Secondary | ICD-10-CM | POA: Diagnosis not present

## 2023-03-07 NOTE — TOC Progression Note (Signed)
Transition of Care [facility]) - Progression Note    Patient Details  Name: MIRL TROCCOLI MRN: [id number] Date of Birth: [birth year]  [facility] CM/SW Contact  Mearl Latin, LCSW Phone Number: 03/07/2023, 3:59 PM  Clinical Narrative:    CSW touched base with Nichole with APS. She has requested records from medical records and will be working with both patient and his significant other to determine Guardianship.    Expected Discharge Plan: Skilled Nursing Facility Barriers to Discharge: Continued Medical Work up, Requiring sitter/restraints, SNF Pending bed offer, Unsafe home situation  Expected Discharge Plan and Services In-house Referral: Clinical Social Work   Post Acute Care Choice: Skilled Nursing Facility Living arrangements for the past 2 months: Single Family Home                                       Social Determinants of Health (SDOH) Interventions SDOH Screenings   Food Insecurity: No Food Insecurity (07/26/2020)   Received from [facility], [facility]  Transportation Needs: No Transportation Needs (04/04/2022)   [facility], [facility]  Tobacco Use: Medium Risk (02/22/2023)    Readmission Risk Interventions     No data to display

## 2023-03-07 NOTE — Plan of Care (Signed)
°  Problem: Nutrition: Goal: Adequate nutrition will be maintained Outcome: Progressing   Problem: Coping: Goal: Level of anxiety will decrease Outcome: Progressing   Problem: Elimination: Goal: Will not experience complications related to bowel motility Outcome: Progressing   Problem: Elimination: Goal: Will not experience complications related to urinary retention Outcome: Progressing   Problem: Pain Managment: Goal: General experience of comfort will improve Outcome: Progressing   Problem: Safety: Goal: Ability to remain free from injury will improve Outcome: Progressing

## 2023-03-07 NOTE — Progress Notes (Signed)
PROGRESS NOTE                                                                                                                                                                                                             Patient Demographics:    Dennis [name], is a 77 y.o. male, DOB - 07-May-1946, ZOX:[id number]  Outpatient Primary MD for the patient is Pcp, No    LOS - 13  Admit date - 02/22/2023    Chief Complaint  Patient presents with   Altered Mental Status       Brief Narrative (HPI from H&P)   77 year old male with past medical history of HLD, BPH, HTN, rheumatoid arthritis, COPD, Barrett's esophagus, OSA does not use CPAP, history of CVA. Patient sent in for altered mentation. At baseline patient has a ataxic gait but is not confused. Over the course of the past week he has become increasingly confused. He was brought to the ER.  In the ER he was trying to escape from the ER room had to be IVC need placed with 1 is to 1 sitter, had 2 CT scans of the head which were unremarkable, he was admitted for further workup of encephalopathy.   Subjective:   No significant events overnight as discussed with staff, he denies any complaints today   Assessment  & Plan :   Gradually progressive metabolic encephalopathy over the course of 5 to 7 days.  Brought in by wife for worsening confusion, confusion persisted in the ER, CT head x 2 several hours apart is nonacute, no focal deficits apparent. He was afebrile, no leukocytosis, CRP and ESR are stable, head CT x 2 stable, ABG stable, RPR stable, ammonia stable, stable B12, TSH, A1c, MRI and EEG x2, LP done after much effort by IR under fluoroscopy on 02/26/2023 rules out bacterial meningitis, CSF findings discussed with neurology and ID physician Dr. Algis Liming, not consistent with any meningitis encephalitis all antibiotics and acyclovir stopped 02/27/2023.  Talking to the neighbors it has  become clear that patient has had gradual downward decline which sounds like dementia for several months, unfortunately wife is quite demented as well, Adult Protective Services has been involved in the past and has been involved again now.  He has a very poor baseline which has been gradually declining over several months due to underlying  undiagnosed dementia.  Will require placement, looking for a bed , medically stable.  Patient has been in restraints for 10 days straight telemetry sitter, will try and place a bedside sitter hopes of discontinuing physical restraints to see if his mental status will improve sitting up in recliner close to the window and exposure to sunlight.   Dehydration, reduced urine output, AKI.  Poor oral intake likely due to confusion as above, hold ARB, hydrate again with IV fluids on 03/03/2023.  Bladder scan has 53 cc of urine.  Continue to monitor bladder scan intermittently.  Repeat BMP tomorrow.  History of rheumatoid arthritis.  Post discharge resume methotrexate and sulfasalazine.  Stable CRP ESR.  BPH.  Proscar and Flomax if he can tolerate oral medications.  Hypertension.  He is on combination of Coreg, Imdur, hydralazine dose increased on 03/04/2023 for better control, as needed IV hydralazine on board as well.  Dyslipidemia.  Check a.m. lipid panel.  Nonspecific EKG changes.  Denies any chest pain.  Coreg and statin, noted with EF slightly depressed and some wall motion abnormalities, have to hold antiplatelets due to pending LP.  Troponin trend was stable and in non-ACS pattern, outpatient cardiology follow-up not a candidate for any invasive procedure or testing due to underlying advanced dementia and poor functional status.  Will optimize medical care.  Continue Coreg, statin, and home dose Plavix.        Family Communication  : None at bedside   Code Status :  Full  Consults  :  Neuro  PUD Prophylaxis :  PPI   Procedures  :     LP - CSF not  consistent with any infection  Echocardiogram.  1. Left ventricular ejection fraction, by estimation, is 45 to 50%. Left ventricular ejection fraction by 2D MOD biplane is 49.5 %. The left ventricle has mildly decreased function. The left ventricle demonstrates regional wall motion abnormalities (see  scoring diagram/findings for description). There is mild left ventricular hypertrophy. Left ventricular diastolic function could not be evaluated.  2. Right ventricular systolic function is normal. The right ventricular size is normal.  3. The mitral valve is normal in structure. No evidence of mitral valve regurgitation. No evidence of mitral stenosis.  4. The aortic valve is normal in structure. There is moderate calcification of the aortic valve. There is moderate thickening of the aortic valve. Aortic valve regurgitation is trivial. No aortic stenosis is present.  5. The inferior vena cava is normal in size with greater than 50% respiratory variability, suggesting right atrial pressure of 3 mmHg.   CT head x 2.  Nonacute.    MRI brain.  1. No acute intracranial abnormality. 2. Moderate generalized atrophy and white matter disease likely reflects the sequela of chronic microvascular ischemia. 3. Remote lacunar infarct of the right thalamus. 4. Bilateral mastoid effusions. No obstructing nasopharyngeal lesion is present  EEG.  No active seizures      Disposition Plan  :    Status is: Inpatient  DVT Prophylaxis  :    Place and maintain sequential compression device Start: 02/23/23 1015    Lab Results  Component Value Date   PLT 317 03/05/2023    Diet :  Diet Order             DIET DYS 3 Room service appropriate? No; Fluid consistency: Thin  Diet effective now                    Inpatient Medications  Scheduled Meds:  atorvastatin  40 mg Oral Daily   carvedilol  3.125 mg Oral BID WC   clopidogrel  75 mg Oral Daily   finasteride  5 mg Oral Daily   folic acid  1 mg Oral Daily    hydrALAZINE  50 mg Oral Q8H   isosorbide mononitrate  30 mg Oral Daily   pantoprazole  40 mg Oral Daily   tamsulosin  0.4 mg Oral QPC supper   Continuous Infusions:    PRN Meds:.acetaminophen **OR** acetaminophen, haloperidol lactate, hydrALAZINE, ondansetron **OR** ondansetron (ZOFRAN) IV, senna-docusate     Objective:   Vitals:   03/06/23 2000 03/07/23 0500 03/07/23 0814 03/07/23 1116  BP: 123/68 124/63 (!) 155/91 136/74  Pulse: 77  87 77  Resp: 18 18 18 16   Temp: 98.9 F (37.2 C) 97.7 F (36.5 C) 98 F (36.7 C) 98.2 F (36.8 C)  TempSrc: Oral Oral Oral Oral  SpO2: 91%  96% 94%  Weight:      Height:        Wt Readings from Last 3 Encounters:  02/22/23 68 kg  02/21/23 68 kg  12/25/22 68 kg     Intake/Output Summary (Last 24 hours) at 03/07/2023 1417 Last data filed at 03/07/2023 0500 Gross per 24 hour  Intake --  Output 600 ml  Net -600 ml      Physical Exam  He is awake, significantly confused with impaired judgment and insight Symmetrical Chest wall movement, Good air movement bilaterally, CTAB RRR,No Gallops,Rubs or new Murmurs, No Parasternal Heave +ve B.Sounds, Abd Soft, No tenderness, No rebound - guarding or rigidity. No Cyanosis, Clubbing or edema, No new Rash or bruise       Data Review:    Recent Labs  Lab 03/01/23 0245 03/03/23 0313 03/05/23 0338  WBC 8.9 8.4 8.3  HGB 11.5* 10.6* 11.1*  HCT 35.4* 32.8* 34.0*  PLT 219 286 317  MCV 88.3 89.1 89.2  MCH 28.7 28.8 29.1  MCHC 32.5 32.3 32.6  RDW 14.3 14.6 14.6  LYMPHSABS 2.0 1.9 2.1  MONOABS 1.1* 0.9 0.9  EOSABS 0.1 0.2 0.2  BASOSABS 0.0 0.0 0.1    Recent Labs  Lab 03/01/23 0245 03/03/23 0313 03/05/23 0338  NA 138 138 138  K 3.9 3.9 3.4*  CL 108 107 103  CO2 23 23 24   ANIONGAP 7 8 11   GLUCOSE 88 105* 90  BUN 8 18 14   CREATININE 0.86 1.39* 1.07  AST  --   --  19  ALT  --   --  14  ALKPHOS  --   --  30*  BILITOT  --   --  0.6  ALBUMIN  --   --  2.3*  BNP 1,535.3*  489.2*  --   MG 1.8 1.8 1.7  CALCIUM 8.9 8.9 9.2      Recent Labs  Lab 03/01/23 0245 03/03/23 0313 03/05/23 0338  BNP 1,535.3* 489.2*  --   MG 1.8 1.8 1.7  CALCIUM 8.9 8.9 9.2    --------------------------------------------------------------------------------------------------------------- Lab Results  Component Value Date   CHOL 152 02/24/2023   HDL 28 (L) 02/24/2023   LDLCALC 105 (H) 02/24/2023   TRIG 94 02/24/2023   CHOLHDL 5.4 02/24/2023    Lab Results  Component Value Date   HGBA1C 5.5 02/23/2023      Micro Results No results found for this or any previous visit (from the past 240 hour(s)).   Radiology Reports DG Chest [facility] 1 View  Result  Date: 03/04/2023 CLINICAL DATA:  141880 SOB (shortness of breath) 141880 EXAM: PORTABLE CHEST 1 VIEW COMPARISON:  Today's prior FINDINGS: The heart appears near the upper limit of normal. Improved aeration of the left lower lobe. No lobar consolidation is evident on today's exam. No pleural effusion. No pneumothorax. Similar coarse interstitial densities throughout the lungs. No acute osseous abnormality. IMPRESSION: Improvement in left lower lobe aeration. No new acute focal pulmonary process identified in the chest. Electronically Signed   By: Olive Bass M.D.   On: 03/04/2023 10:13      Signature  -   Mliss Fritz Martez Weiand M.D on 03/07/2023 at 2:17 PM   -  To page go to [email or web address]

## 2023-03-08 DIAGNOSIS — R410 Disorientation, unspecified: Secondary | ICD-10-CM | POA: Diagnosis not present

## 2023-03-08 MED ORDER — ENOXAPARIN SODIUM 40 MG/0.4ML IJ SOSY
40.0000 mg | PREFILLED_SYRINGE | INTRAMUSCULAR | Status: DC
  Administered 2023-03-08 – 2023-03-16 (×9): 40 mg via SUBCUTANEOUS
  Filled 2023-03-08 (×9): qty 0.4

## 2023-03-08 NOTE — Progress Notes (Signed)
PROGRESS NOTE                                                                                                                                                                                                             Patient Demographics:    Dennis [name], is a 77 y.o. male, DOB - July 28, 1945, JXB:[id number]  Outpatient Primary MD for the patient is Pcp, No    LOS - 14  Admit date - 02/22/2023    Chief Complaint  Patient presents with   Altered Mental Status       Brief Narrative (HPI from H&P)   77 year old male with past medical history of HLD, BPH, HTN, rheumatoid arthritis, COPD, Barrett's esophagus, OSA does not use CPAP, history of CVA. Patient sent in for altered mentation. At baseline patient has a ataxic gait but is not confused. Over the course of the past week he has become increasingly confused. He was brought to the ER.  In the ER he was trying to escape from the ER room had to be IVC need placed with 1 is to 1 sitter, had 2 CT scans of the head which were unremarkable, he was admitted for further workup of encephalopathy.   Subjective:   Significant events overnight as discussed with staff, he denies any complaints today.   Assessment  & Plan :   Gradually progressive metabolic encephalopathy over the course of 5 to 7 days.  Brought in by wife for worsening confusion, confusion persisted in the ER, CT head x 2 several hours apart is nonacute, no focal deficits apparent. He was afebrile, no leukocytosis, CRP and ESR are stable, head CT x 2 stable, ABG stable, RPR stable, ammonia stable, stable B12, TSH, A1c, MRI and EEG x2, LP done after much effort by IR under fluoroscopy on 02/26/2023 rules out bacterial meningitis, CSF findings discussed with neurology and ID physician Dr. Algis Liming, not consistent with any meningitis encephalitis all antibiotics and acyclovir stopped 02/27/2023.  Talking to the neighbors it has  become clear that patient has had gradual downward decline which sounds like dementia for several months, unfortunately wife is quite demented as well, Adult Protective Services has been involved in the past and has been involved again now.  He has a very poor baseline which has been gradually declining over several months due to underlying undiagnosed  dementia.  Will require placement, looking for a bed , medically stable.   Dehydration, reduced urine output, AKI.  Poor oral intake likely due to confusion as above, hold ARB, hydrate again with IV fluids on 03/03/2023.  Bladder scan has 53 cc of urine.  Continue to monitor bladder scan intermittently.    History of rheumatoid arthritis.  - By reviewing his meds he does not appear to be on sulfasalazine anymore,  Stable CRP ESR.  BPH.  Proscar and Flomax if he can tolerate oral medications.  Hypertension.  He is on combination of Coreg, Imdur, hydralazine dose increased on 03/04/2023 for better control, as needed IV hydralazine on board as well.  Dyslipidemia.  Check a.m. lipid panel.  Nonspecific EKG changes.  -No chest pain, continue with medical management, Coreg, Plavix and statin.         Family Communication  : None at bedside   Code Status :  Full  Consults  :  Neuro  PUD Prophylaxis :  PPI   Procedures  :     LP - CSF not consistent with any infection  Echocardiogram.  1. Left ventricular ejection fraction, by estimation, is 45 to 50%. Left ventricular ejection fraction by 2D MOD biplane is 49.5 %. The left ventricle has mildly decreased function. The left ventricle demonstrates regional wall motion abnormalities (see  scoring diagram/findings for description). There is mild left ventricular hypertrophy. Left ventricular diastolic function could not be evaluated.  2. Right ventricular systolic function is normal. The right ventricular size is normal.  3. The mitral valve is normal in structure. No evidence of mitral valve  regurgitation. No evidence of mitral stenosis.  4. The aortic valve is normal in structure. There is moderate calcification of the aortic valve. There is moderate thickening of the aortic valve. Aortic valve regurgitation is trivial. No aortic stenosis is present.  5. The inferior vena cava is normal in size with greater than 50% respiratory variability, suggesting right atrial pressure of 3 mmHg.   CT head x 2.  Nonacute.    MRI brain.  1. No acute intracranial abnormality. 2. Moderate generalized atrophy and white matter disease likely reflects the sequela of chronic microvascular ischemia. 3. Remote lacunar infarct of the right thalamus. 4. Bilateral mastoid effusions. No obstructing nasopharyngeal lesion is present  EEG.  No active seizures      Disposition Plan  :    Status is: Inpatient  DVT Prophylaxis  :    enoxaparin (LOVENOX) injection 40 mg Start: 03/08/23 0845 Place and maintain sequential compression device Start: 02/23/23 1015    Lab Results  Component Value Date   PLT 317 03/05/2023    Diet :  Diet Order             DIET DYS 3 Room service appropriate? No; Fluid consistency: Thin  Diet effective now                    Inpatient Medications  Scheduled Meds:  atorvastatin  40 mg Oral Daily   carvedilol  3.125 mg Oral BID WC   clopidogrel  75 mg Oral Daily   enoxaparin (LOVENOX) injection  40 mg Subcutaneous Q24H   finasteride  5 mg Oral Daily   folic acid  1 mg Oral Daily   hydrALAZINE  50 mg Oral Q8H   isosorbide mononitrate  30 mg Oral Daily   pantoprazole  40 mg Oral Daily   tamsulosin  0.4 mg Oral QPC supper  Continuous Infusions:    PRN Meds:.acetaminophen **OR** acetaminophen, haloperidol lactate, hydrALAZINE, ondansetron **OR** ondansetron (ZOFRAN) IV, senna-docusate     Objective:   Vitals:   03/07/23 2218 03/08/23 0031 03/08/23 0800 03/08/23 1200  BP: 135/72 137/70 (!) 107/56 99/69  Pulse:  85 70 80  Resp:  16 18 18   Temp:   97.8 F (36.6 C) 97.8 F (36.6 C) 98.2 F (36.8 C)  TempSrc:  Oral Oral Oral  SpO2: 95% 95% 98% 96%  Weight:      Height:        Wt Readings from Last 3 Encounters:  02/22/23 68 kg  02/21/23 68 kg  12/25/22 68 kg     Intake/Output Summary (Last 24 hours) at 03/08/2023 1255 Last data filed at 03/08/2023 0641 Gross per 24 hour  Intake 120 ml  Output 2600 ml  Net -2480 ml      Physical Exam  He is awake, significantly confused with impaired judgment and insight Symmetrical Chest wall movement, Good air movement bilaterally, CTAB RRR,No Gallops,Rubs or new Murmurs, No Parasternal Heave +ve B.Sounds, Abd Soft, No tenderness, No rebound - guarding or rigidity. No Cyanosis, Clubbing or edema, No new Rash or bruise        Data Review:    Recent Labs  Lab 03/03/23 0313 03/05/23 0338  WBC 8.4 8.3  HGB 10.6* 11.1*  HCT 32.8* 34.0*  PLT 286 317  MCV 89.1 89.2  MCH 28.8 29.1  MCHC 32.3 32.6  RDW 14.6 14.6  LYMPHSABS 1.9 2.1  MONOABS 0.9 0.9  EOSABS 0.2 0.2  BASOSABS 0.0 0.1    Recent Labs  Lab 03/03/23 0313 03/05/23 0338  NA 138 138  K 3.9 3.4*  CL 107 103  CO2 23 24  ANIONGAP 8 11  GLUCOSE 105* 90  BUN 18 14  CREATININE 1.39* 1.07  AST  --  19  ALT  --  14  ALKPHOS  --  30*  BILITOT  --  0.6  ALBUMIN  --  2.3*  BNP 489.2*  --   MG 1.8 1.7  CALCIUM 8.9 9.2      Recent Labs  Lab 03/03/23 0313 03/05/23 0338  BNP 489.2*  --   MG 1.8 1.7  CALCIUM 8.9 9.2    --------------------------------------------------------------------------------------------------------------- Lab Results  Component Value Date   CHOL 152 02/24/2023   HDL 28 (L) 02/24/2023   LDLCALC 105 (H) 02/24/2023   TRIG 94 02/24/2023   CHOLHDL 5.4 02/24/2023    Lab Results  Component Value Date   HGBA1C 5.5 02/23/2023      Micro Results No results found for this or any previous visit (from the past 240 hour(s)).   Radiology Reports No results found.     Signature  -   Huey Bienenstock M.D on 03/08/2023 at 12:55 PM   -  To page go to [email or web address]

## 2023-03-08 NOTE — Plan of Care (Signed)

## 2023-03-09 DIAGNOSIS — R410 Disorientation, unspecified: Secondary | ICD-10-CM | POA: Diagnosis not present

## 2023-03-09 NOTE — Plan of Care (Signed)

## 2023-03-09 NOTE — Plan of Care (Signed)
  Problem: Education: Goal: Knowledge of General Education information will improve Description: Including pain rating scale, medication(s)/side effects and non-pharmacologic comfort measures Outcome: Progressing   Problem: Clinical Measurements: Goal: Ability to maintain clinical measurements within normal limits will improve Outcome: Progressing Goal: Will remain free from infection Outcome: Progressing   Problem: Activity: Goal: Risk for activity intolerance will decrease Outcome: Progressing   Problem: Safety: Goal: Ability to remain free from injury will improve Outcome: Progressing

## 2023-03-09 NOTE — Progress Notes (Signed)
PROGRESS NOTE                                                                                                                                                                                                             Patient Demographics:    Dennis [name], is a 77 y.o. male, DOB - 03/30/1946, ZOX:[id number]  Outpatient Primary MD for the patient is Pcp, No    LOS - 15  Admit date - 02/22/2023    Chief Complaint  Patient presents with   Altered Mental Status       Brief Narrative (HPI from H&P)    77 year old male with past medical history of HLD, BPH, HTN, rheumatoid arthritis, COPD, Barrett's esophagus, OSA does not use CPAP, history of CVA. Patient sent in for altered mentation. At baseline patient has a ataxic gait but is not confused. Over the course of the past week he has become increasingly confused. He was brought to the ER.  In the ER he was trying to escape from the ER room had to be IVC need placed with 1 is to 1 sitter, had 2 CT scans of the head which were unremarkable, he was admitted for further workup of encephalopathy.  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   Subjective:   Significant events overnight as discussed with staff, he denies any complaints today.   Assessment  & Plan :   Gradually progressive metabolic encephalopathy over the course of 5 to 7 days.  Brought in by wife for worsening confusion, confusion persisted in the ER, CT head x 2 several hours apart is nonacute, no focal deficits apparent. He was afebrile, no leukocytosis, CRP and ESR are stable, head CT x 2 stable, ABG stable, RPR stable, ammonia stable, stable B12, TSH, A1c, MRI and EEG x2, LP done after much effort by IR under fluoroscopy on  02/26/2023 rules out bacterial meningitis, CSF findings discussed with neurology and ID physician Dr. Algis Liming, not consistent with any meningitis encephalitis all antibiotics and acyclovir stopped 02/27/2023. -Does appear he has been having progressive dementia, of gradual incident decline over last few months, as well does appear wife has been demented, Adult  Protective Services are involved, I have discussed with neighbor [name], who is trying to arrange for safe discharge environment at home, social worker to contact her tomorrow to see if we can provide enough support for him to go home with his wife.    Dehydration, reduced urine output, AKI.  Poor oral intake likely due to confusion as above, hold ARB, hydrate again with IV fluids on 03/03/2023.  Bladder scan has 53 cc of urine.  Continue to monitor bladder scan intermittently.    History of rheumatoid arthritis.  - By reviewing his meds he does not appear to be on sulfasalazine anymore,  Stable CRP ESR.  BPH.  Proscar and Flomax if he can tolerate oral medications.  Hypertension.  He is on combination of Coreg, Imdur, hydralazine dose increased on 03/04/2023 for better control, as needed IV hydralazine on board as well.  Dyslipidemia.  Check a.m. lipid panel.  Nonspecific EKG changes.  -No chest pain, continue with medical management, Coreg, Plavix and statin.         Family Communication  : I have discussed with wife, and neighbor Ms. Robyn at bedside 10/19   Code Status :  Full  Consults  :  Neuro  PUD Prophylaxis :  PPI   Procedures  :     LP - CSF not consistent with any infection  Echocardiogram.  1. Left ventricular ejection fraction, by estimation, is 45 to 50%. Left ventricular ejection fraction by 2D MOD biplane is 49.5 %. The left ventricle has mildly decreased function. The left ventricle demonstrates regional wall motion abnormalities (see  scoring diagram/findings for description). There is mild left ventricular  hypertrophy. Left ventricular diastolic function could not be evaluated.  2. Right ventricular systolic function is normal. The right ventricular size is normal.  3. The mitral valve is normal in structure. No evidence of mitral valve regurgitation. No evidence of mitral stenosis.  4. The aortic valve is normal in structure. There is moderate calcification of the aortic valve. There is moderate thickening of the aortic valve. Aortic valve regurgitation is trivial. No aortic stenosis is present.  5. The inferior vena cava is normal in size with greater than 50% respiratory variability, suggesting right atrial pressure of 3 mmHg.   CT head x 2.  Nonacute.    MRI brain.  1. No acute intracranial abnormality. 2. Moderate generalized atrophy and white matter disease likely reflects the sequela of chronic microvascular ischemia. 3. Remote lacunar infarct of the right thalamus. 4. Bilateral mastoid effusions. No obstructing nasopharyngeal lesion is present  EEG.  No active seizures      Disposition Plan  :    Status is: Inpatient  DVT Prophylaxis  :    enoxaparin (LOVENOX) injection 40 mg Start: 03/08/23 0845 Place and maintain sequential compression device Start: 02/23/23 1015    Lab Results  Component Value Date   PLT 317 03/05/2023    Diet :  Diet Order             DIET DYS 3 Room service appropriate? No; Fluid consistency: Thin  Diet effective now                    Inpatient Medications  Scheduled Meds:  atorvastatin  40 mg Oral Daily   carvedilol  3.125 mg Oral BID WC   clopidogrel  75 mg Oral Daily   enoxaparin (LOVENOX) injection  40 mg Subcutaneous Q24H   finasteride  5 mg Oral Daily  folic acid  1 mg Oral Daily   hydrALAZINE  50 mg Oral Q8H   isosorbide mononitrate  30 mg Oral Daily   pantoprazole  40 mg Oral Daily   tamsulosin  0.4 mg Oral QPC supper   Continuous Infusions:    PRN Meds:.acetaminophen **OR** acetaminophen, haloperidol lactate, hydrALAZINE,  ondansetron **OR** ondansetron (ZOFRAN) IV, senna-docusate     Objective:   Vitals:   03/08/23 2327 03/09/23 0545 03/09/23 0748 03/09/23 1140  BP: 129/68 118/60 139/66 (!) 115/52  Pulse: 84 80 88 85  Resp: 20 18 18 16   Temp: 97.6 F (36.4 C) 98 F (36.7 C) 97.8 F (36.6 C) 98 F (36.7 C)  TempSrc: Oral Oral Oral Oral  SpO2: 94% 95% 99% 98%  Weight:      Height:        Wt Readings from Last 3 Encounters:  02/22/23 68 kg  02/21/23 68 kg  12/25/22 68 kg     Intake/Output Summary (Last 24 hours) at 03/09/2023 1214 Last data filed at 03/09/2023 0751 Gross per 24 hour  Intake --  Output 2000 ml  Net -2000 ml      Physical Exam  Awake Alert, with significant impaired judgment and insight Symmetrical Chest wall movement, Good air movement bilaterally, CTAB RRR,No Gallops,Rubs or new Murmurs, No Parasternal Heave +ve B.Sounds, Abd Soft, No tenderness, No rebound - guarding or rigidity. No Cyanosis, Clubbing or edema, No new Rash or bruise         Data Review:    Recent Labs  Lab 03/03/23 0313 03/05/23 0338  WBC 8.4 8.3  HGB 10.6* 11.1*  HCT 32.8* 34.0*  PLT 286 317  MCV 89.1 89.2  MCH 28.8 29.1  MCHC 32.3 32.6  RDW 14.6 14.6  LYMPHSABS 1.9 2.1  MONOABS 0.9 0.9  EOSABS 0.2 0.2  BASOSABS 0.0 0.1    Recent Labs  Lab 03/03/23 0313 03/05/23 0338  NA 138 138  K 3.9 3.4*  CL 107 103  CO2 23 24  ANIONGAP 8 11  GLUCOSE 105* 90  BUN 18 14  CREATININE 1.39* 1.07  AST  --  19  ALT  --  14  ALKPHOS  --  30*  BILITOT  --  0.6  ALBUMIN  --  2.3*  BNP 489.2*  --   MG 1.8 1.7  CALCIUM 8.9 9.2      Recent Labs  Lab 03/03/23 0313 03/05/23 0338  BNP 489.2*  --   MG 1.8 1.7  CALCIUM 8.9 9.2    --------------------------------------------------------------------------------------------------------------- Lab Results  Component Value Date   CHOL 152 02/24/2023   HDL 28 (L) 02/24/2023   LDLCALC 105 (H) 02/24/2023   TRIG 94 02/24/2023    CHOLHDL 5.4 02/24/2023    Lab Results  Component Value Date   HGBA1C 5.5 02/23/2023      Micro Results No results found for this or any previous visit (from the past 240 hour(s)).   Radiology Reports No results found.    Signature  -   Huey Bienenstock M.D on 03/09/2023 at 12:14 PM   -  To page go to [email or web address]

## 2023-03-10 DIAGNOSIS — R410 Disorientation, unspecified: Secondary | ICD-10-CM | POA: Diagnosis not present

## 2023-03-10 NOTE — TOC Progression Note (Addendum)
Transition of Care [facility]) - Progression Note    Patient Details  Name: Dennis [name] MRN: [id number] Date of Birth: [birth year]  Transition of Care [facility] CM/SW Contact  Mearl Latin, LCSW Phone Number: 03/10/2023, 2:07 PM  Clinical Narrative:    CSW spoke with patient's neighbor, [name], as MD mentioned [name] may be putting a plan in place to take patient home. [name] stated that she took Patient's significant other to the doctor today and has a neurologist appointment scheduled as well with the help of Nichole with [facility] to see if Lisa's neurological condition can be slowed down with medication. CSW broached discharge planning and [name] reported being overwhelmed with trying to figure out a plan for both patient and Misty Stanley. Patient is progressing physically so may not qualify for SNF rehab. CSW will follow up with Nichole with [facility].   Per Nichole with [facility], she is taking Misty Stanley for a capacity evaluation on Thursday so they will know then if [facility] needs to file an emergency order to be able to sign consents for patient.    Expected Discharge Plan: Skilled Nursing Facility Barriers to Discharge: Continued Medical Work up, Requiring sitter/restraints, SNF Pending bed offer, Unsafe home situation  Expected Discharge Plan and Services In-house Referral: Clinical Social Work   Post Acute Care Choice: Skilled Nursing Facility Living arrangements for the past 2 months: Single Family Home                                       Social Determinants of Health (SDOH) Interventions SDOH Screenings   Food Insecurity: No Food Insecurity (07/26/2020)   [facility] from [facility]  Transportation Needs: No Transportation Needs (04/04/2022)   Received from [facility], [facility]  Tobacco Use: Medium Risk (02/22/2023)    Readmission Risk Interventions     No data to display

## 2023-03-10 NOTE — Plan of Care (Signed)
  Problem: Clinical Measurements: Goal: Ability to maintain clinical measurements within normal limits will improve Outcome: Progressing   Problem: Activity: Goal: Risk for activity intolerance will decrease Outcome: Progressing   Problem: Nutrition: Goal: Adequate nutrition will be maintained Outcome: Progressing   Problem: Safety: Goal: Ability to remain free from injury will improve Outcome: Progressing   

## 2023-03-10 NOTE — Progress Notes (Signed)
Physical Therapy Treatment Patient Details Name: Dennis [name] MRN: [id number] DOB: 05/06/1946 Today's Date: 03/10/2023   History of Present Illness 77 yo male presenting to ED 10/5 with AMS. CTH and MRI brain negative; undergoing long-term EEG and lumbar puncture pending. PMH includes HLD, BPH, HTN, rheumatoid arthritis, COPD, Barrett's esophagus, OSA, prior CVA,  dysarthria, LLE weakness, dementia.    PT Comments  Pt is received in recliner, he is agreeable to PT session. Pt AO x2 with frequent request for "red candy" from multi hospital staff and reports "wife got mad at me" although able to respond to commands with extended time. Pt performs transfers and amb CGA for safety. During STS, Pt able to perform task without AD without LOB demonstrating improvements. Additionally, Pt able to amb approx 90 ft using RW CGA with intermittent verbal cues for environmental safety awareness. PT provided additional verbal cues to encourage increase stride length during amb activity. Overall, Pt demonstrates steady improvements towards PT goals but would benefit from continued skilled PT to address above deficits and promote optimal return to PLOF.    If plan is discharge home, recommend the following: Assistance with cooking/housework;Assistance with feeding;Direct supervision/assist for medications management;Direct supervision/assist for financial management;Assist for transportation;Help with stairs or ramp for entrance;Supervision due to cognitive status;Two people to help with walking and/or transfers   Can travel by private vehicle     Yes  Equipment Recommendations  Other (comment) (TBD at next facility)    Recommendations for Other Services       Precautions / Restrictions Precautions Precautions: Fall Restrictions Weight Bearing Restrictions: No     Mobility  Bed Mobility               General bed mobility comments: Not assessed due to Pt in recliner pre/post session     Transfers Overall transfer level: Needs assistance Equipment used: None Transfers: Sit to/from Stand Sit to Stand: Contact guard assist           General transfer comment: Pt able to perform STS without AD from recliner CGA for safety; no LOB noted during mobility    Ambulation/Gait Ambulation/Gait assistance: Contact guard assist Gait Distance (Feet): 90 Feet Assistive device: Rolling walker (2 wheels) Gait Pattern/deviations: Step-to pattern, Step-through pattern, Drifts right/left, Narrow base of support Gait velocity: decreased     General Gait Details: Pt able to amb approx 90 ft using RW with occasional cuing to correct drift and awareness of surrounding; additional verbal cues to encourage increase stride length   Stairs             Wheelchair Mobility     Tilt Bed    Modified Rankin (Stroke Patients Only)       Balance Overall balance assessment: Needs assistance Sitting-balance support: Feet supported, No upper extremity supported Sitting balance-Leahy Scale: Good Sitting balance - Comments: about to maintain seated EOB without LOB during functional activities   Standing balance support: Bilateral upper extremity supported, During functional activity Standing balance-Leahy Scale: Good Standing balance comment: about to maintain static standing balance using RW during functional activities without LOB noted                            Cognition Arousal: Alert Behavior During Therapy: WFL for tasks assessed/performed Overall Cognitive Status: Impaired/Different from baseline Area of Impairment: Attention, Memory, Following commands, Safety/judgement, Orientation, Awareness, Problem solving  Orientation Level: Disoriented to, Time, Situation Current Attention Level: Sustained Memory: Decreased short-term memory, Decreased recall of precautions Following Commands: Follows one step commands with increased  time Safety/Judgement: Decreased awareness of safety, Decreased awareness of deficits Awareness: Intellectual Problem Solving: Slow processing, Requires verbal cues, Difficulty sequencing, Requires tactile cues General Comments: Pleasant but frequent repeats himself and slightly confused throughout session        Exercises      General Comments        Pertinent Vitals/Pain Pain Assessment Pain Assessment: No/denies pain    Home Living                          Prior Function            PT Goals (current goals can now be found in the care plan section) Acute Rehab PT Goals Patient Stated Goal: To go home and to see my wife. PT Goal Formulation: Patient unable to participate in goal setting Time For Goal Achievement: 03/24/23 Potential to Achieve Goals: Fair Progress towards PT goals: Progressing toward goals    Frequency    Min 1X/week      PT Plan      Co-evaluation              AM-PAC PT "6 Clicks" Mobility   Outcome Measure  Help needed turning from your back to your side while in a flat bed without using bedrails?: A Little Help needed moving from lying on your back to sitting on the side of a flat bed without using bedrails?: A Little Help needed moving to and from a bed to a chair (including a wheelchair)?: A Little Help needed standing up from a chair using your arms (e.g., wheelchair or bedside chair)?: A Little Help needed to walk in hospital room?: A Little (intermittent cuing for safety awareness) Help needed climbing 3-5 steps with a railing? : A Lot 6 Click Score: 17    End of Session   Activity Tolerance: Patient tolerated treatment well Patient left: in chair;with call bell/phone within reach;with chair alarm set Nurse Communication: Mobility status PT Visit Diagnosis: Unsteadiness on feet (R26.81);Other abnormalities of gait and mobility (R26.89);History of falling (Z91.81);Muscle weakness (generalized) (M62.81)     Time:  5573-2202 PT Time Calculation (min) (ACUTE ONLY): 12 min  Charges:    $Gait Training: 8-22 mins PT General Charges $$ ACUTE PT VISIT: 1 Visit                     Terrin Imparato, PT, GCS 03/10/23,1:12 PM

## 2023-03-10 NOTE — Plan of Care (Signed)

## 2023-03-10 NOTE — Progress Notes (Signed)
PROGRESS NOTE                                                                                                                                                                                                             Patient Demographics:    Dennis [name], is a 77 y.o. male, DOB - 1946/05/03, NWG:[id number]  Outpatient Primary MD for the patient is Pcp, No    LOS - 16  Admit date - 02/22/2023    Chief Complaint  Patient presents with   Altered Mental Status       Brief Narrative (HPI from H&P)     77 year old male with past medical history of HLD, BPH, HTN, rheumatoid arthritis, COPD, Barrett's esophagus, OSA does not use CPAP, history of CVA. Patient sent in for altered mentation. At baseline patient has a ataxic gait but is not confused. Over the course of the past week he has become increasingly confused. He was brought to the ER.  In the ER he was trying to escape from the ER room had to be IVC need placed with 1 is to 1 sitter, had 2 CT scans of the head which were unremarkable, he was admitted for further workup of encephalopathy.  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   Subjective:   No significant he denies any complaints today.  He denies any complaints today.   Assessment  & Plan :   Gradually progressive metabolic encephalopathy over the course of 5 to 7 days.   - Brought in by wife for worsening confusion, confusion persisted in the ER, CT head x 2 several hours apart is nonacute, no focal deficits apparent. He was afebrile, no leukocytosis, CRP and ESR are stable, head CT x 2 stable, ABG stable, RPR stable, ammonia stable, stable B12, TSH, A1c, MRI and EEG x2, LP done after much effort by IR under fluoroscopy on  02/26/2023 rules out bacterial meningitis, CSF findings discussed with neurology and ID physician Dr. Algis Liming, not consistent with any meningitis encephalitis all antibiotics and acyclovir stopped 02/27/2023. -Does appear he has been having progressive dementia, of gradual incident decline over last few months, as well does appear wife  has been demented, Adult Protective Services are involved, I have discussed with neighbor [name], who is trying to arrange for safe discharge environment at home, social worker to contact her tomorrow to see if we can provide enough support for him to go home with his wife.    Dehydration, reduced urine output, AKI.  Poor oral intake likely due to confusion as above, hold ARB, hydrate again with IV fluids on 03/03/2023.  Bladder scan has 53 cc of urine.  Continue to monitor bladder scan intermittently.    History of rheumatoid arthritis.  - By reviewing his meds he does not appear to be on sulfasalazine anymore,  Stable CRP ESR.  BPH.  Proscar and Flomax if he can tolerate oral medications.  Hypertension.  He is on combination of Coreg, Imdur, hydralazine dose increased on 03/04/2023 for better control, as needed IV hydralazine on board as well.  Dyslipidemia.  Check a.m. lipid panel.  Nonspecific EKG changes.  -No chest pain, continue with medical management, Coreg, Plavix and statin.   Patient medically stable for discharge, await safe disposition plan.        Family Communication  : I have discussed with wife, and neighbor Ms. Robyn at bedside 10/19   Code Status :  Full  Consults  :  Neuro  PUD Prophylaxis :  PPI   Procedures  :     LP - CSF not consistent with any infection  Echocardiogram.  1. Left ventricular ejection fraction, by estimation, is 45 to 50%. Left ventricular ejection fraction by 2D MOD biplane is 49.5 %. The left ventricle has mildly decreased function. The left ventricle demonstrates regional wall motion abnormalities (see   scoring diagram/findings for description). There is mild left ventricular hypertrophy. Left ventricular diastolic function could not be evaluated.  2. Right ventricular systolic function is normal. The right ventricular size is normal.  3. The mitral valve is normal in structure. No evidence of mitral valve regurgitation. No evidence of mitral stenosis.  4. The aortic valve is normal in structure. There is moderate calcification of the aortic valve. There is moderate thickening of the aortic valve. Aortic valve regurgitation is trivial. No aortic stenosis is present.  5. The inferior vena cava is normal in size with greater than 50% respiratory variability, suggesting right atrial pressure of 3 mmHg.   CT head x 2.  Nonacute.    MRI brain.  1. No acute intracranial abnormality. 2. Moderate generalized atrophy and white matter disease likely reflects the sequela of chronic microvascular ischemia. 3. Remote lacunar infarct of the right thalamus. 4. Bilateral mastoid effusions. No obstructing nasopharyngeal lesion is present  EEG.  No active seizures      Disposition Plan  :    Status is: Inpatient  DVT Prophylaxis  :    enoxaparin (LOVENOX) injection 40 mg Start: 03/08/23 0845 Place and maintain sequential compression device Start: 02/23/23 1015    Lab Results  Component Value Date   PLT 317 03/05/2023    Diet :  Diet Order             DIET DYS 3 Room service appropriate? No; Fluid consistency: Thin  Diet effective now                    Inpatient Medications  Scheduled Meds:  atorvastatin  40 mg Oral Daily   carvedilol  3.125 mg Oral BID WC   clopidogrel  75 mg Oral Daily   enoxaparin (LOVENOX) injection  40 mg Subcutaneous Q24H   finasteride  5 mg Oral Daily   folic acid  1 mg Oral Daily   hydrALAZINE  50 mg Oral Q8H   isosorbide mononitrate  30 mg Oral Daily   pantoprazole  40 mg Oral Daily   tamsulosin  0.4 mg Oral QPC supper   Continuous Infusions:    PRN  Meds:.acetaminophen **OR** acetaminophen, haloperidol lactate, hydrALAZINE, ondansetron **OR** ondansetron (ZOFRAN) IV, senna-docusate     Objective:   Vitals:   03/09/23 2338 03/10/23 0400 03/10/23 0800 03/10/23 1206  BP: (!) 142/71 (!) 150/83 120/62 (!) 109/52  Pulse:  80 69 86  Resp:  18 18 18   Temp: 98.2 F (36.8 C) 97.9 F (36.6 C) 98.9 F (37.2 C) 98.5 F (36.9 C)  TempSrc: Oral Oral Oral Oral  SpO2:  98%    Weight:      Height:        Wt Readings from Last 3 Encounters:  02/22/23 68 kg  02/21/23 68 kg  12/25/22 68 kg     Intake/Output Summary (Last 24 hours) at 03/10/2023 1448 Last data filed at 03/10/2023 1130 Gross per 24 hour  Intake 480 ml  Output 3400 ml  Net -2920 ml      Physical Exam  Awake Alert, with significant impaired judgment and insight Symmetrical Chest wall movement, Good air movement bilaterally, CTAB RRR,No Gallops,Rubs or new Murmurs, No Parasternal Heave +ve B.Sounds, Abd Soft, No tenderness, No rebound - guarding or rigidity. No Cyanosis, Clubbing or edema, No new Rash or bruise        Data Review:    Recent Labs  Lab 03/05/23 0338  WBC 8.3  HGB 11.1*  HCT 34.0*  PLT 317  MCV 89.2  MCH 29.1  MCHC 32.6  RDW 14.6  LYMPHSABS 2.1  MONOABS 0.9  EOSABS 0.2  BASOSABS 0.1    Recent Labs  Lab 03/05/23 0338  NA 138  K 3.4*  CL 103  CO2 24  ANIONGAP 11  GLUCOSE 90  BUN 14  CREATININE 1.07  AST 19  ALT 14  ALKPHOS 30*  BILITOT 0.6  ALBUMIN 2.3*  MG 1.7  CALCIUM 9.2      Recent Labs  Lab 03/05/23 0338  MG 1.7  CALCIUM 9.2    --------------------------------------------------------------------------------------------------------------- Lab Results  Component Value Date   CHOL 152 02/24/2023   HDL 28 (L) 02/24/2023   LDLCALC 105 (H) 02/24/2023   TRIG 94 02/24/2023   CHOLHDL 5.4 02/24/2023    Lab Results  Component Value Date   HGBA1C 5.5 02/23/2023      Micro Results No results found for  this or any previous visit (from the past 240 hour(s)).   Radiology Reports No results found.    Signature  -   Huey Bienenstock M.D on 03/10/2023 at 2:48 PM   -  To page go to [email or web address]

## 2023-03-11 DIAGNOSIS — R410 Disorientation, unspecified: Secondary | ICD-10-CM | POA: Diagnosis not present

## 2023-03-11 LAB — CBC
HCT: 32.9 % — ABNORMAL LOW (ref 39.0–52.0)
Hemoglobin: 10.7 g/dL — ABNORMAL LOW (ref 13.0–17.0)
MCH: 29.6 pg (ref 26.0–34.0)
MCHC: 32.5 g/dL (ref 30.0–36.0)
MCV: 90.9 fL (ref 80.0–100.0)
Platelets: 306 10*3/uL (ref 150–400)
RBC: 3.62 MIL/uL — ABNORMAL LOW (ref 4.22–5.81)
RDW: 14.6 % (ref 11.5–15.5)
WBC: 8.3 10*3/uL (ref 4.0–10.5)
nRBC: 0 % (ref 0.0–0.2)

## 2023-03-11 LAB — BASIC METABOLIC PANEL
Anion gap: 11 (ref 5–15)
BUN: 19 mg/dL (ref 8–23)
CO2: 26 mmol/L (ref 22–32)
Calcium: 10 mg/dL (ref 8.9–10.3)
Chloride: 105 mmol/L (ref 98–111)
Creatinine, Ser: 1.21 mg/dL (ref 0.61–1.24)
GFR, Estimated: >60 mL/min (ref >60)
Glucose, Bld: 98 mg/dL (ref 70–99)
Potassium: 3.7 mmol/L (ref 3.5–5.1)
Sodium: 142 mmol/L (ref 135–145)

## 2023-03-11 NOTE — TOC Progression Note (Signed)
Transition of Care [facility]) - Progression Note    Patient Details  Name: Dennis [name] MRN: [id number] Date of Birth: Aug 12, 1945  [facility] CM/SW Contact  Mearl Latin, LCSW Phone Number: 03/11/2023, 12:20 PM  Clinical Narrative:    CSW updated [facility]'s liaison on Lisa's capacity evaluation tomorrow.    Expected Discharge Plan: Skilled Nursing Facility Barriers to Discharge: Continued Medical Work up, Requiring sitter/restraints, SNF Pending bed offer, Unsafe home situation  Expected Discharge Plan and Services In-house Referral: Clinical Social Work   Post Acute Care Choice: Skilled Nursing Facility Living arrangements for the past 2 months: Single Family Home                                       Social Determinants of Health (SDOH) Interventions SDOH Screenings   Food Insecurity: No Food Insecurity (07/26/2020)   Received from [facility], [facility]  Transportation Needs: No Transportation Needs (04/04/2022)   Received from [facility], [facility]  Tobacco Use: Medium Risk (02/22/2023)    Readmission Risk Interventions     No data to display

## 2023-03-11 NOTE — Progress Notes (Signed)
Occupational Therapy Treatment Patient Details Name: Dennis [name] MRN: [id number] DOB: 1945/10/18 Today's Date: 03/11/2023   History of present illness 77 yo male presenting to ED 10/5 with AMS. CTH and MRI brain negative; undergoing long-term EEG and lumbar puncture pending. PMH includes HLD, BPH, HTN, rheumatoid arthritis, COPD, Barrett's esophagus, OSA, prior CVA,  dysarthria, LLE weakness, dementia.   OT comments  Pt is making steady progress towards their acute OT goals, goals updated in care plan. He continues to be significantly impaired by poor cognition however physically he is improving. Overall he completed bed mobility, transfers and short mobility with CGA only. He needed constant cues and redirection for attention, safety and sequencing. Pt pleasantly confused, perseverating on self-distracting thought and asking to go home despite education. OT to continue to follow acutely to facilitate progress towards established goals. Pt will continue to benefit from skilled inpatient follow up therapy, <3 hours/day.       If plan is discharge home, recommend the following:  A lot of help with bathing/dressing/bathroom;Direct supervision/assist for medications management;Direct supervision/assist for financial management;Assist for transportation;Help with stairs or ramp for entrance;Supervision due to cognitive status;A little help with walking and/or transfers   Equipment Recommendations  Other (comment)       Precautions / Restrictions Precautions Precautions: Fall Restrictions Weight Bearing Restrictions: No       Mobility Bed Mobility Overal bed mobility: Needs Assistance Bed Mobility: Supine to Sit     Supine to sit: Contact guard     General bed mobility comments: HOB elevated    Transfers Overall transfer level: Needs assistance Equipment used: None Transfers: Sit to/from Stand Sit to Stand: Contact guard assist           General transfer comment: no AD  despite verbal cue     Balance Overall balance assessment: Needs assistance Sitting-balance support: Feet supported, No upper extremity supported Sitting balance-Leahy Scale: Good     Standing balance support: During functional activity, No upper extremity supported Standing balance-Leahy Scale: Fair Standing balance comment: no overt LOB for short distance                           ADL either performed or assessed with clinical judgement   ADL Overall ADL's : Needs assistance/impaired Eating/Feeding: Minimal assistance Eating/Feeding Details (indicate cue type and reason): needs cues for attention to sustain task                     Toilet Transfer: Contact guard assist Toilet Transfer Details (indicate cue type and reason): no AD         Functional mobility during ADLs: Contact guard assist General ADL Comments: no AD this date, pt walked right by RW without regard for its use.    Extremity/Trunk Assessment Upper Extremity Assessment Upper Extremity Assessment: Generalized weakness   Lower Extremity Assessment Lower Extremity Assessment: Defer to PT evaluation        Vision   Vision Assessment?: No apparent visual deficits   Perception Perception Perception: Not tested   Praxis Praxis Praxis: Not tested    Cognition Arousal: Alert Behavior During Therapy: WFL for tasks assessed/performed Overall Cognitive Status: Impaired/Different from baseline Area of Impairment: Memory, Following commands, Safety/judgement, Problem solving, Awareness, Orientation                 Orientation Level: Disoriented to, Situation, Time   Memory: Decreased recall of precautions, Decreased short-term memory Following  Commands: Follows one step commands with increased time Safety/Judgement: Decreased awareness of safety, Decreased awareness of deficits Awareness: Intellectual Problem Solving: Slow processing, Requires verbal cues, Difficulty sequencing,  Requires tactile cues General Comments: pleasantly confused, easily redirected. little to no insight to deficits. poor memory. needs cues to initiate and sequence through tasks              General Comments VSS, family present at the end of the session    Pertinent Vitals/ Pain       Pain Assessment Pain Assessment: No/denies pain         Frequency  Min 1X/week        Progress Toward Goals  OT Goals(current goals can now be found in the care plan section)  Progress towards OT goals: Progressing toward goals  Acute Rehab OT Goals Patient Stated Goal: to go home OT Goal Formulation: With patient Time For Goal Achievement: 03/25/23 Potential to Achieve Goals: Good ADL Goals Pt Will Perform Grooming: with modified independence;standing Pt Will Transfer to Toilet: with modified independence;ambulating Additional ADL Goal #1: pt will sequence through simple ADL task without verbal cues to demonstrate improved sustained attention Additional ADL Goal #2: Pt will navigate hospital environment with supervision A and no safety concerns to demonstrate reduced fall risk         AM-PAC OT "6 Clicks" Daily Activity     Outcome Measure   Help from another person eating meals?: A Little Help from another person taking care of personal grooming?: A Little Help from another person toileting, which includes using toliet, bedpan, or urinal?: A Little Help from another person bathing (including washing, rinsing, drying)?: A Lot Help from another person to put on and taking off regular upper body clothing?: A Lot Help from another person to put on and taking off regular lower body clothing?: A Lot 6 Click Score: 15    End of Session Equipment Utilized During Treatment: Gait belt  OT Visit Diagnosis: Unsteadiness on feet (R26.81);Other abnormalities of gait and mobility (R26.89);Muscle weakness (generalized) (M62.81)   Activity Tolerance Patient tolerated treatment well   Patient  Left in chair;with call bell/phone within reach;with chair alarm set   Nurse Communication Mobility status        Time: 1440-1501 OT Time Calculation (min): 21 min  Charges: OT General Charges $OT Visit: 1 Visit OT Treatments $Self Care/Home Management : 8-22 mins  Derenda Mis, OTR/L Acute Rehabilitation Services Office [phone] Secure Chat Communication Preferred   [name] 03/11/2023, 3:16 PM

## 2023-03-11 NOTE — Progress Notes (Signed)
PROGRESS NOTE                                                                                                                                                                                                             Patient Demographics:    Dennis [name], is a 77 y.o. male, DOB - [birth year], ZOX:[id number]  Outpatient Primary MD for the patient is Pcp, No    LOS - 17  Admit date - 02/22/2023    Chief Complaint  Patient presents with   Altered Mental Status       Brief Narrative (HPI from H&P)     77 year old male with past medical history of HLD, BPH, HTN, rheumatoid arthritis, COPD, Barrett's esophagus, OSA does not use CPAP, history of CVA. Patient sent in for altered mentation. At baseline patient has a ataxic gait but is not confused. Over the course of the past week he has become increasingly confused. He was brought to the ER.  In the ER he was trying to escape from the ER room had to be IVC need placed with 1 is to 1 sitter, had 2 CT scans of the head which were unremarkable, he was admitted for further workup of encephalopathy.  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   Subjective:   Significant events overnight, he denies any complaints.   Assessment  & Plan :   Gradually progressive metabolic encephalopathy over the course of 5 to 7 days.   - Brought in by wife for worsening confusion, confusion persisted in the ER, CT head x 2 several hours apart is nonacute, no focal deficits apparent. He was afebrile, no leukocytosis, CRP and ESR are stable, head CT x 2 stable, ABG stable, RPR stable, ammonia stable, stable B12, TSH, A1c, MRI and EEG x2, LP done after much effort by IR under fluoroscopy on 02/26/2023 rules out  bacterial meningitis, CSF findings discussed with neurology and ID physician Dr. Algis Liming, not consistent with any meningitis encephalitis all antibiotics and acyclovir stopped 02/27/2023. -Does appear he has been having progressive dementia, of gradual incident decline over last few months, as well does appear wife has been demented, Adult Protective Services  are involved, I have discussed with neighbor [name], who is trying to arrange for safe discharge environment at home, social worker to contact her tomorrow to see if we can provide enough support for him to go home with his wife.    Dehydration, reduced urine output, AKI.  Poor oral intake likely due to confusion as above, hold ARB, hydrate again with IV fluids on 03/03/2023.  Bladder scan has 53 cc of urine.  Continue to monitor bladder scan intermittently.    History of rheumatoid arthritis.  - By reviewing his meds he does not appear to be on sulfasalazine anymore,  Stable CRP ESR.  BPH.  Proscar and Flomax if he can tolerate oral medications.  Hypertension.  He is on combination of Coreg, Imdur, hydralazine dose increased on 03/04/2023 for better control, as needed IV hydralazine on board as well.  Dyslipidemia.  Check a.m. lipid panel.  Nonspecific EKG changes.  -No chest pain, continue with medical management, Coreg, Plavix and statin.   Patient medically stable for discharge, await safe disposition plan.        Family Communication  : I have discussed with wife, and neighbor [name] at bedside 10/22   Code Status :  Full  Consults  :  Neuro  PUD Prophylaxis :  PPI   Procedures  :     LP - CSF not consistent with any infection  Echocardiogram.  1. Left ventricular ejection fraction, by estimation, is 45 to 50%. Left ventricular ejection fraction by 2D MOD biplane is 49.5 %. The left ventricle has mildly decreased function. The left ventricle demonstrates regional wall motion abnormalities (see  scoring  diagram/findings for description). There is mild left ventricular hypertrophy. Left ventricular diastolic function could not be evaluated.  2. Right ventricular systolic function is normal. The right ventricular size is normal.  3. The mitral valve is normal in structure. No evidence of mitral valve regurgitation. No evidence of mitral stenosis.  4. The aortic valve is normal in structure. There is moderate calcification of the aortic valve. There is moderate thickening of the aortic valve. Aortic valve regurgitation is trivial. No aortic stenosis is present.  5. The inferior vena cava is normal in size with greater than 50% respiratory variability, suggesting right atrial pressure of 3 mmHg.   CT head x 2.  Nonacute.    MRI brain.  1. No acute intracranial abnormality. 2. Moderate generalized atrophy and white matter disease likely reflects the sequela of chronic microvascular ischemia. 3. Remote lacunar infarct of the right thalamus. 4. Bilateral mastoid effusions. No obstructing nasopharyngeal lesion is present  EEG.  No active seizures      Disposition Plan  :    Status is: Inpatient  DVT Prophylaxis  :    enoxaparin (LOVENOX) injection 40 mg Start: 03/08/23 0845 Place and maintain sequential compression device Start: 02/23/23 1015    Lab Results  Component Value Date   PLT 306 03/11/2023    Diet :  Diet Order             DIET DYS 3 Room service appropriate? No; Fluid consistency: Thin  Diet effective now                    Inpatient Medications  Scheduled Meds:  atorvastatin  40 mg Oral Daily   carvedilol  3.125 mg Oral BID WC   clopidogrel  75 mg Oral Daily   enoxaparin (LOVENOX) injection  40 mg Subcutaneous Q24H  finasteride  5 mg Oral Daily   folic acid  1 mg Oral Daily   hydrALAZINE  50 mg Oral Q8H   isosorbide mononitrate  30 mg Oral Daily   pantoprazole  40 mg Oral Daily   tamsulosin  0.4 mg Oral QPC supper   Continuous Infusions:    PRN  Meds:.acetaminophen **OR** acetaminophen, haloperidol lactate, hydrALAZINE, ondansetron **OR** ondansetron (ZOFRAN) IV, senna-docusate     Objective:   Vitals:   03/10/23 2339 03/11/23 0540 03/11/23 0850 03/11/23 1300  BP: 138/72 (!) 139/58 (!) 167/83 123/77  Pulse: 80 84 92 67  Resp: 18 18 20 19   Temp: 98 F (36.7 C) 97.6 F (36.4 C) 98 F (36.7 C)   TempSrc: Oral Oral Oral Oral  SpO2: 97% 94%    Weight:      Height:        Wt Readings from Last 3 Encounters:  02/22/23 68 kg  02/21/23 68 kg  12/25/22 68 kg     Intake/Output Summary (Last 24 hours) at 03/11/2023 1524 Last data filed at 03/11/2023 0900 Gross per 24 hour  Intake 60 ml  Output 1050 ml  Net -990 ml      Physical Exam  Awake pleasant, in no apparent distress, significantly impaired judgment and insight Symmetrical Chest wall movement, Good air movement bilaterally, CTAB RRR,No Gallops,Rubs or new Murmurs, No Parasternal Heave +ve B.Sounds, Abd Soft, No tenderness, No rebound - guarding or rigidity. No Cyanosis, Clubbing or edema, No new Rash or bruise          Data Review:    Recent Labs  Lab 03/05/23 0338 03/11/23 0323  WBC 8.3 8.3  HGB 11.1* 10.7*  HCT 34.0* 32.9*  PLT 317 306  MCV 89.2 90.9  MCH 29.1 29.6  MCHC 32.6 32.5  RDW 14.6 14.6  LYMPHSABS 2.1  --   MONOABS 0.9  --   EOSABS 0.2  --   BASOSABS 0.1  --     Recent Labs  Lab 03/05/23 0338 03/11/23 0323  NA 138 142  K 3.4* 3.7  CL 103 105  CO2 24 26  ANIONGAP 11 11  GLUCOSE 90 98  BUN 14 19  CREATININE 1.07 1.21  AST 19  --   ALT 14  --   ALKPHOS 30*  --   BILITOT 0.6  --   ALBUMIN 2.3*  --   MG 1.7  --   CALCIUM 9.2 10.0      Recent Labs  Lab 03/05/23 0338 03/11/23 0323  MG 1.7  --   CALCIUM 9.2 10.0    --------------------------------------------------------------------------------------------------------------- Lab Results  Component Value Date   CHOL 152 02/24/2023   HDL 28 (L) 02/24/2023    LDLCALC 105 (H) 02/24/2023   TRIG 94 02/24/2023   CHOLHDL 5.4 02/24/2023    Lab Results  Component Value Date   HGBA1C 5.5 02/23/2023      Micro Results No results found for this or any previous visit (from the past 240 hour(s)).   Radiology Reports No results found.    Signature  -   Huey Bienenstock M.D on 03/11/2023 at 3:24 PM   -  To page go to [email or web address]

## 2023-03-12 DIAGNOSIS — I10 Essential (primary) hypertension: Secondary | ICD-10-CM | POA: Diagnosis not present

## 2023-03-12 DIAGNOSIS — R410 Disorientation, unspecified: Secondary | ICD-10-CM | POA: Diagnosis not present

## 2023-03-12 DIAGNOSIS — E7801 Familial hypercholesterolemia: Secondary | ICD-10-CM

## 2023-03-12 MED ORDER — ORAL CARE MOUTH RINSE: 15.0000 mL | OROMUCOSAL | Status: DC | PRN

## 2023-03-12 NOTE — Plan of Care (Signed)
  Problem: Health Behavior/Discharge Planning: Goal: Ability to manage health-related needs will improve Outcome: Progressing   Problem: Clinical Measurements: Goal: Ability to maintain clinical measurements within normal limits will improve Outcome: Progressing   Problem: Nutrition: Goal: Adequate nutrition will be maintained Outcome: Progressing   

## 2023-03-12 NOTE — Plan of Care (Signed)
Pt has rested quietly throughout the night with no distress noted. Alert and oriented to person only. Pt trying to get OOB at times. Reoriented. On room air. Purwick on to suction. Pt is not on tele. Pt incontinent once for large amount urine when he pulled off purwick. No complaints voiced.     Problem: Education: Goal: Knowledge of General Education information will improve Description: Including pain rating scale, medication(s)/side effects and non-pharmacologic comfort measures Outcome: Progressing   Problem: Health Behavior/Discharge Planning: Goal: Ability to manage health-related needs will improve Outcome: Progressing   Problem: Clinical Measurements: Goal: Ability to maintain clinical measurements within normal limits will improve Outcome: Progressing Goal: Will remain free from infection Outcome: Progressing Goal: Diagnostic test results will improve Outcome: Progressing Goal: Respiratory complications will improve Outcome: Progressing Goal: Cardiovascular complication will be avoided Outcome: Progressing   Problem: Activity: Goal: Risk for activity intolerance will decrease Outcome: Progressing   Problem: Nutrition: Goal: Adequate nutrition will be maintained Outcome: Progressing   Problem: Coping: Goal: Level of anxiety will decrease Outcome: Progressing   Problem: Elimination: Goal: Will not experience complications related to bowel motility Outcome: Progressing Goal: Will not experience complications related to urinary retention Outcome: Progressing   Problem: Pain Managment: Goal: General experience of comfort will improve Outcome: Progressing   Problem: Safety: Goal: Ability to remain free from injury will improve Outcome: Progressing   Problem: Skin Integrity: Goal: Risk for impaired skin integrity will decrease Outcome: Progressing

## 2023-03-12 NOTE — Progress Notes (Signed)
Mobility Specialist Progress Note;   03/12/23 1115  Mobility  Activity Ambulated with assistance in hallway  Level of Assistance Contact guard assist, steadying assist  Assistive Device Front wheel walker  Distance Ambulated (ft) 250 ft  Activity Response Tolerated well  Mobility Referral Yes  $Mobility charge 1 Mobility  Mobility Specialist Start Time (ACUTE ONLY) 1115  Mobility Specialist Stop Time (ACUTE ONLY) 1140  Mobility Specialist Time Calculation (min) (ACUTE ONLY) 25 min   Pt agreeable to mobility. Required minG assistance during ambulation. A bit impulsive with steering during ambulation. Required min cues for direction while ambulating. No c/o during session. Pt back in chair with all needs met. Alarm on.  Caesar Bookman Mobility Specialist Please contact via SecureChat or Rehab Office [phone]

## 2023-03-12 NOTE — Progress Notes (Signed)
PROGRESS NOTE        PATIENT DETAILS Name: Dennis [name] Age: 77 y.o. Sex: male Date of Birth: Jan 10, 1946 Admit Date: 02/22/2023 Admitting Physician Gery Pray, MD PCP:Pcp, No  Brief Summary: Patient is a 77 y.o.  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  Significant events: 10/5>> admit to [facility]  Significant studies: 10/5>> TSH: Normal 10/6>> MRI brain: No acute abnormality-moderate generalized atrophy. 10/6>> RPR: Nonreactive 10/6>> ammonia: 12 10/6>> vitamin B12: 611 10/6>> vitamin B1: 126 10/7>> CSF protein 48 10/7>> echo: EF 45-50% 10/8-10/9>> LTM EEG: No seizures  Significant microbiology data: 10/7>> CSF meningitis/encephalitis panel: Negative 10/7>> CSF culture: Negative 10/7>> CSF autoimmune encephalopathy panel: Negative  Procedures: 10/9>> lumbar puncture by IR  Consults: Neurology  Subjective: Lying comfortably in bed-denies any chest pain or shortness of breath.  Objective: Vitals: Blood pressure 115/70, pulse 85, temperature 97.9 F (36.6 C), temperature source Oral, resp. rate 18, height 5\' 4"  (1.626 m), weight 68 kg, SpO2 94%.   Exam: Gen Exam:Alert awake-not in any distress HEENT:atraumatic, normocephalic Chest: B/L clear to auscultation anteriorly CVS:S1S2 regular Abdomen:soft non tender, non distended Extremities:no edema Neurology: Non focal Skin: no rash  Pertinent Labs/Radiology:    Latest Ref Rng & Units 03/11/2023    3:23 AM 03/05/2023    3:38 AM 03/03/2023    3:13 AM  CBC  WBC 4.0 - 10.5 K/uL 8.3  8.3  8.4   Hemoglobin 13.0 - 17.0 g/dL 16.1  09.6  04.5   Hematocrit  39.0 - 52.0 % 32.9  34.0  32.8   Platelets 150 - 400 K/uL 306  317  286     Lab Results  Component Value Date   NA 142 03/11/2023   K 3.7 03/11/2023   CL 105 03/11/2023   CO2 26 03/11/2023      Assessment/Plan: Worsening confusion-likely due to progressive-but undiagnosed dementia Extensive neuroimaging-workup as above all negative for any significant causes Felt to have undiagnosed dementia at this point Child psychotherapist following-awaiting safe disposition  AKI Hemodynamically mediated Resolved  History of RA Stable  BPH Proscar/Flomax  HTN Stable Coreg/hydralazine/Imdur  Prior history of CVA Plavix/statin  HLD Statin  BMI: Estimated body mass index is 25.73 kg/m as calculated from the following:   Height as of this encounter: 5\' 4"  (1.626 m).   Weight as of this encounter: 68 kg.   Code status:   Code Status: Full Code   DVT Prophylaxis: enoxaparin (LOVENOX) injection 40 mg Start: 03/08/23 0845 Place and maintain sequential compression device Start: 02/23/23 1015   Family Communication: None at bedside   Disposition Plan: Status is: Inpatient Remains inpatient appropriate because: Severity of illness   Planned Discharge Destination:Skilled nursing facility versus home   Diet: Diet Order             DIET DYS 3 Room service appropriate? No; Fluid consistency: Thin  Diet effective now                     Antimicrobial agents: Anti-infectives (From admission, onward)    Start     Dose/Rate Route Frequency Ordered Stop  02/24/23 2200  vancomycin (VANCOCIN) IVPB 1000 mg/200 mL premix  Status:  Discontinued        1,000 mg 200 mL/hr over 60 Minutes Intravenous Every 24 hours 02/23/23 2355 02/27/23 1037   02/24/23 0600  cefTRIAXone (ROCEPHIN) 2 g in sodium chloride 0.9 % 100 mL IVPB  Status:  Discontinued        2 g 200 mL/hr over 30 Minutes Intravenous Every 12 hours 02/23/23 2343 02/27/23 1037   02/24/23 0045  acyclovir (ZOVIRAX) 680 mg  in dextrose 5 % 100 mL IVPB  Status:  Discontinued        10 mg/kg  68 kg 113.6 mL/hr over 60 Minutes Intravenous Every 12 hours 02/23/23 2345 02/27/23 1223   02/24/23 0030  vancomycin (VANCOREADY) IVPB 1500 mg/300 mL        1,500 mg 150 mL/hr over 120 Minutes Intravenous  Once 02/23/23 2336 02/24/23 0330   02/24/23 0030  ampicillin (OMNIPEN) 2 g in sodium chloride 0.9 % 100 mL IVPB  Status:  Discontinued        2 g 300 mL/hr over 20 Minutes Intravenous Every 6 hours 02/23/23 2340 02/27/23 1037   02/23/23 1400  cefTRIAXone (ROCEPHIN) 2 g in sodium chloride 0.9 % 100 mL IVPB  Status:  Discontinued        2 g 200 mL/hr over 30 Minutes Intravenous Every 24 hours 02/23/23 1232 02/23/23 2343        MEDICATIONS: Scheduled Meds:  atorvastatin  40 mg Oral Daily   carvedilol  3.125 mg Oral BID WC   clopidogrel  75 mg Oral Daily   enoxaparin (LOVENOX) injection  40 mg Subcutaneous Q24H   finasteride  5 mg Oral Daily   folic acid  1 mg Oral Daily   hydrALAZINE  50 mg Oral Q8H   isosorbide mononitrate  30 mg Oral Daily   pantoprazole  40 mg Oral Daily   tamsulosin  0.4 mg Oral QPC supper   Continuous Infusions: PRN Meds:.acetaminophen **OR** acetaminophen, haloperidol lactate, hydrALAZINE, ondansetron **OR** ondansetron (ZOFRAN) IV, mouth rinse, mouth rinse, senna-docusate   I have personally reviewed following labs and imaging studies  LABORATORY DATA: CBC: Recent Labs  Lab 03/11/23 0323  WBC 8.3  HGB 10.7*  HCT 32.9*  MCV 90.9  PLT 306    Basic Metabolic Panel: Recent Labs  Lab 03/11/23 0323  NA 142  K 3.7  CL 105  CO2 26  GLUCOSE 98  BUN 19  CREATININE 1.21  CALCIUM 10.0    GFR: Estimated Creatinine Clearance: 42.1 mL/min (by C-G formula based on SCr of 1.21 mg/dL).  Liver Function Tests: No results for input(s): "AST", "ALT", "ALKPHOS", "BILITOT", "PROT", "ALBUMIN" in the last 168 hours. No results for input(s): "LIPASE", "AMYLASE" in the last 168  hours. No results for input(s): "AMMONIA" in the last 168 hours.  Coagulation Profile: No results for input(s): "INR", "PROTIME" in the last 168 hours.  Cardiac Enzymes: No results for input(s): "CKTOTAL", "CKMB", "CKMBINDEX", "TROPONINI" in the last 168 hours.  BNP (last 3 results) No results for input(s): "PROBNP" in the last 8760 hours.  Lipid Profile: No results for input(s): "CHOL", "HDL", "LDLCALC", "TRIG", "CHOLHDL", "LDLDIRECT" in the last 72 hours.  Thyroid Function Tests: No results for input(s): "TSH", "T4TOTAL", "FREET4", "T3FREE", "THYROIDAB" in the last 72 hours.  Anemia Panel: No results for input(s): "VITAMINB12", "FOLATE", "FERRITIN", "TIBC", "IRON", "RETICCTPCT" in the last 72 hours.  Urine analysis:    Component Value Date/Time  COLORURINE YELLOW 02/23/2023 0605   APPEARANCEUR HAZY (A) 02/23/2023 0605   LABSPEC 1.018 02/23/2023 0605   PHURINE 5.0 02/23/2023 0605   GLUCOSEU NEGATIVE 02/23/2023 0605   HGBUR NEGATIVE 02/23/2023 0605   BILIRUBINUR NEGATIVE 02/23/2023 0605   KETONESUR NEGATIVE 02/23/2023 0605   PROTEINUR 100 (A) 02/23/2023 0605   NITRITE NEGATIVE 02/23/2023 0605   LEUKOCYTESUR NEGATIVE 02/23/2023 0605    Sepsis Labs: Lactic Acid, Venous No results found for: "LATICACIDVEN"  MICROBIOLOGY: No results found for this or any previous visit (from the past 240 hour(s)).  RADIOLOGY STUDIES/RESULTS: No results found.   LOS: 18 days   Jeoffrey Massed, MD  [facility]    To contact the attending provider between 7A-7P or the covering provider during after hours 7P-7A, please log into the web site [email or web address] and access using universal [place] password for that web site. If you do not have the password, please call the hospital operator.  03/12/2023, 2:03 PM

## 2023-03-12 NOTE — Progress Notes (Signed)
Physical Therapy Treatment Patient Details Name: Dennis [name] MRN: [id number] DOB: 04-01-1946 Today's Date: 03/12/2023   History of Present Illness 77 yo male presenting to ED 10/5 with AMS. CTH and MRI brain negative. EEG was negative for seizure activity, and imaging of the brain reveals no acute abnormality. LP completed 10/9, CSF benign.  LTM discontinued. PMH includes HLD, BPH, HTN, rheumatoid arthritis, COPD, Barrett's esophagus, OSA, prior CVA, dysarthria, LLE weakness, dementia.    PT Comments  Pt received in supine, agreeable to therapy session and with good participation and tolerance for gait and stair training without AD. Pt reports he does not use RW at baseline, but currently very unsteady without AD, needing min to modA to perform household distance gait trial and ascend/descend standard height platform step in room multiple reps. Pt does not have railings for stairs at home and he needs modA to ascend step when unsupported, min to modA to ascend/descend steps with +1 HHA. Per chart review pt's spouse also with memory and possibly mobility issues and likely could not provide constant physical assist for him. Pt continues to benefit from PT services to progress toward functional mobility goals.    If plan is discharge home, recommend the following: Assistance with cooking/housework;Assistance with feeding;Direct supervision/assist for medications management;Direct supervision/assist for financial management;Assist for transportation;Help with stairs or ramp for entrance;Supervision due to cognitive status;Two people to help with walking and/or transfers   Can travel by private vehicle     Yes  Equipment Recommendations  Other (comment) (TBD)    Recommendations for Other Services       Precautions / Restrictions Precautions Precautions: Fall Precaution Comments: dementia Restrictions Weight Bearing Restrictions: No     Mobility  Bed Mobility Overal bed mobility: Needs  Assistance Bed Mobility: Supine to Sit     Supine to sit: Contact guard, Used rails, HOB elevated     General bed mobility comments: good initiation, increased effort to perform    Transfers Overall transfer level: Needs assistance Equipment used: None Transfers: Sit to/from Stand Sit to Stand: Contact guard assist           General transfer comment: from EOB with no AD and CGA, then x10 reps from chair reciprocal without UE support    Ambulation/Gait Ambulation/Gait assistance: Min assist Gait Distance (Feet): 90 Feet Assistive device: 1 person hand held assist, None Gait Pattern/deviations: Step-to pattern, Step-through pattern, Drifts right/left, Narrow base of support Gait velocity: decreased Gait velocity interpretation: <1.31 ft/sec, indicative of household ambulator   General Gait Details: Cues for safety, wayfinding and improved posture/foot placement. Pt tends to downward gaze/forward head posture unless cued to stand straighter. pt reports no RW prior to admission but wtihout AD pt with multiple LOB to his R side and needs frequent HHA or gait belt support to recover.   Stairs Stairs: Yes Stairs assistance: Mod assist Stair Management: No rails, Step to pattern, Forwards (LUE HHA) Number of Stairs: 1 (x10 reps) General stair comments: PTA looked up pt's home set-up via google maps as pt unable to accurately recall, pt appears to have 3-4 STE porch on front of home with no rail. Pt attempted to ascend step without UE support but wtih significant R LOB needing mod/maxA to recover and ultimately pt reliant on min/modA with LUE support to step up/down standard height step. Pt able to perform x10 reps for BLE strengthening.   Wheelchair Mobility     Tilt Bed    Modified Rankin (Stroke Patients Only)  Balance Overall balance assessment: Needs assistance Sitting-balance support: Feet supported, No upper extremity supported Sitting balance-Leahy Scale:  Good     Standing balance support: During functional activity, No upper extremity supported Standing balance-Leahy Scale: Poor Standing balance comment: R LOB with stepping up to 7" (standard height) platform in room and also LOB for gait trial in hallway without RW. with RW, pt with Fair standing balance.                            Cognition Arousal: Alert Behavior During Therapy: WFL for tasks assessed/performed Overall Cognitive Status: Impaired/Different from baseline Area of Impairment: Memory, Following commands, Safety/judgement, Problem solving, Awareness, Orientation                 Orientation Level: Disoriented to, Situation, Time   Memory: Decreased recall of precautions, Decreased short-term memory Following Commands: Follows one step commands with increased time Safety/Judgement: Decreased awareness of safety, Decreased awareness of deficits Awareness: Intellectual Problem Solving: Slow processing, Requires verbal cues, Difficulty sequencing, Requires tactile cues General Comments: Pt pleasantly confused, easily redirected. He has little to no insight to deficits and poor memory. He needs cues to initiate and sequence through tasks. Inconsistent report of PLOF, spouse also has dementia and neighbor helps them a lot at home.        Exercises Other Exercises Other Exercises: STS x 10 reps using arms Other Exercises: step-ups x10 reps for BLE strengthening (HHA)    General Comments General comments (skin integrity, edema, etc.): VSS on RA per pulse oximeter SpO2 >94% on RA and HR 90's bpm with exertion.      Pertinent Vitals/Pain Pain Assessment Pain Assessment: No/denies pain Faces Pain Scale: No hurt Pain Intervention(s): Monitored during session, Repositioned    Home Living                          Prior Function            PT Goals (current goals can now be found in the care plan section) Acute Rehab PT Goals Patient Stated  Goal: To go home and to see my wife. PT Goal Formulation: Patient unable to participate in goal setting Time For Goal Achievement: 03/24/23 Progress towards PT goals: Progressing toward goals    Frequency    Min 1X/week      PT Plan      Co-evaluation              AM-PAC PT "6 Clicks" Mobility   Outcome Measure  Help needed turning from your back to your side while in a flat bed without using bedrails?: A Little Help needed moving from lying on your back to sitting on the side of a flat bed without using bedrails?: A Little Help needed moving to and from a bed to a chair (including a wheelchair)?: A Little Help needed standing up from a chair using your arms (e.g., wheelchair or bedside chair)?: A Little Help needed to walk in hospital room?: A Lot (without AD) Help needed climbing 3-5 steps with a railing? : A Lot (loss of balance) 6 Click Score: 16    End of Session Equipment Utilized During Treatment: Gait belt Activity Tolerance: Patient tolerated treatment well Patient left: in chair;with call bell/phone within reach;with chair alarm set Nurse Communication: Mobility status PT Visit Diagnosis: Unsteadiness on feet (R26.81);Other abnormalities of gait and mobility (R26.89);History of falling (Z91.81);Muscle weakness (  generalized) (M62.81)     Time: 4696-2952 PT Time Calculation (min) (ACUTE ONLY): 31 min  Charges:    $Gait Training: 8-22 mins $Therapeutic Exercise: 8-22 mins PT General Charges $$ ACUTE PT VISIT: 1 Visit                     Elinda Bunten P., PTA Acute Rehabilitation Services Secure Chat Preferred 9a-5:30pm Office: [phone]    Dorathy Kinsman [facility] 03/12/2023, 6:28 PM

## 2023-03-13 DIAGNOSIS — R410 Disorientation, unspecified: Secondary | ICD-10-CM | POA: Diagnosis not present

## 2023-03-13 NOTE — Plan of Care (Signed)
Pt has rested quietly throughout the night with no distress noted. Alert and oriented to self only. Tries to get OOB at times. ON room air. Not on tele. Tele sitter in room. [place] intact to suction. No complaints voiced.     Problem: Education: Goal: Knowledge of General Education information will improve Description: Including pain rating scale, medication(s)/side effects and non-pharmacologic comfort measures Outcome: Progressing   Problem: Health Behavior/Discharge Planning: Goal: Ability to manage health-related needs will improve Outcome: Progressing   Problem: Clinical Measurements: Goal: Ability to maintain clinical measurements within normal limits will improve Outcome: Progressing Goal: Will remain free from infection Outcome: Progressing Goal: Diagnostic test results will improve Outcome: Progressing Goal: Respiratory complications will improve Outcome: Progressing Goal: Cardiovascular complication will be avoided Outcome: Progressing   Problem: Activity: Goal: Risk for activity intolerance will decrease Outcome: Progressing   Problem: Nutrition: Goal: Adequate nutrition will be maintained Outcome: Progressing   Problem: Coping: Goal: Level of anxiety will decrease Outcome: Progressing   Problem: Elimination: Goal: Will not experience complications related to bowel motility Outcome: Progressing Goal: Will not experience complications related to urinary retention Outcome: Progressing   Problem: Pain Managment: Goal: General experience of comfort will improve Outcome: Progressing   Problem: Safety: Goal: Ability to remain free from injury will improve Outcome: Progressing   Problem: Skin Integrity: Goal: Risk for impaired skin integrity will decrease Outcome: Progressing

## 2023-03-13 NOTE — Progress Notes (Signed)
Mobility Specialist Progress Note;   03/13/23 1140  Mobility  Activity Ambulated with assistance in hallway  Level of Assistance Contact guard assist, steadying assist  Assistive Device Front wheel walker  Distance Ambulated (ft) 350 ft  Activity Response Tolerated well  Mobility Referral Yes  $Mobility charge 1 Mobility  Mobility Specialist Start Time (ACUTE ONLY) 1140  Mobility Specialist Stop Time (ACUTE ONLY) 1210  Mobility Specialist Time Calculation (min) (ACUTE ONLY) 30 min   Pt agreeable to mobility. Required minG assistance during ambulation. Asx throughout session. Able to brush teeth in room as well. Pt back in chair with all needs met and eager to walk again. Alarm on.  Caesar Bookman Mobility Specialist Please contact via SecureChat or Rehab Office [phone]

## 2023-03-13 NOTE — Progress Notes (Signed)
PROGRESS NOTE        PATIENT DETAILS Name: Dennis [name] Age: 77 y.o. Sex: male Date of Birth: 1946/02/01 Admit Date: 02/22/2023 Admitting Physician Gery Pray, MD PCP:Pcp, No  Brief Summary: Patient is a 77 y.o.  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  Significant events: 10/5>> admit to [facility]  Significant studies: 10/5>> TSH: Normal 10/6>> MRI brain: No acute abnormality-moderate generalized atrophy. 10/6>> RPR: Nonreactive 10/6>> ammonia: 12 10/6>> vitamin B12: 611 10/6>> vitamin B1: 126 10/7>> CSF protein 48 10/7>> echo: EF 45-50% 10/8-10/9>> LTM EEG: No seizures  Significant microbiology data: 10/7>> CSF meningitis/encephalitis panel: Negative 10/7>> CSF culture: Negative 10/7>> CSF autoimmune encephalopathy panel: Negative  Procedures: 10/9>> lumbar puncture by IR  Consults: Neurology  Subjective: No complaints-eating breakfast this morning.  Objective: Vitals: Blood pressure (!) 117/54, pulse 86, temperature 98.6 F (37 C), temperature source Oral, resp. rate 18, height 5\' 4"  (1.626 m), weight 68 kg, SpO2 94%.   Exam: Awake/alert-not in any distress.  Pertinent Labs/Radiology:    Latest Ref Rng & Units 03/11/2023    3:23 AM 03/05/2023    3:38 AM 03/03/2023    3:13 AM  CBC  WBC 4.0 - 10.5 K/uL 8.3  8.3  8.4   Hemoglobin 13.0 - 17.0 g/dL 59.5  63.8  75.6   Hematocrit 39.0 - 52.0 % 32.9  34.0  32.8   Platelets 150 - 400 K/uL 306  317  286     Lab Results  Component Value Date   NA 142 03/11/2023   K 3.7 03/11/2023   CL 105 03/11/2023   CO2 26 03/11/2023       Assessment/Plan: Worsening confusion-likely due to progressive-but undiagnosed dementia Extensive neuroimaging-workup as above all negative for any significant causes Felt to have undiagnosed dementia at this point Child psychotherapist following-awaiting safe disposition  AKI Hemodynamically mediated Resolved  History of RA Stable  BPH Proscar/Flomax  HTN Stable Coreg/hydralazine/Imdur  Prior history of CVA Plavix/statin  HLD Statin  BMI: Estimated body mass index is 25.73 kg/m as calculated from the following:   Height as of this encounter: 5\' 4"  (1.626 m).   Weight as of this encounter: 68 kg.   Code status:   Code Status: Full Code   DVT Prophylaxis: enoxaparin (LOVENOX) injection 40 mg Start: 03/08/23 0845 Place and maintain sequential compression device Start: 02/23/23 1015   Family Communication: None at bedside   Disposition Plan: Status is: Inpatient Remains inpatient appropriate because: Severity of illness   Planned Discharge Destination:Skilled nursing facility versus home   Diet: Diet Order             DIET DYS 3 Room service appropriate? No; Fluid consistency: Thin  Diet effective now                     Antimicrobial agents: Anti-infectives (From admission, onward)    Start     Dose/Rate Route Frequency Ordered Stop   02/24/23 2200  vancomycin (VANCOCIN) IVPB 1000 mg/200 mL premix  Status:  Discontinued        1,000 mg 200 mL/hr over 60 Minutes  Intravenous Every 24 hours 02/23/23 2355 02/27/23 1037   02/24/23 0600  cefTRIAXone (ROCEPHIN) 2 g in sodium chloride 0.9 % 100 mL IVPB  Status:  Discontinued        2 g 200 mL/hr over 30 Minutes Intravenous Every 12 hours 02/23/23 2343 02/27/23 1037   02/24/23 0045  acyclovir (ZOVIRAX) 680 mg in dextrose 5 % 100 mL IVPB  Status:  Discontinued        10 mg/kg  68 kg 113.6 mL/hr over 60 Minutes Intravenous Every 12 hours 02/23/23 2345 02/27/23 1223   02/24/23 0030  vancomycin (VANCOREADY)  IVPB 1500 mg/300 mL        1,500 mg 150 mL/hr over 120 Minutes Intravenous  Once 02/23/23 2336 02/24/23 0330   02/24/23 0030  ampicillin (OMNIPEN) 2 g in sodium chloride 0.9 % 100 mL IVPB  Status:  Discontinued        2 g 300 mL/hr over 20 Minutes Intravenous Every 6 hours 02/23/23 2340 02/27/23 1037   02/23/23 1400  cefTRIAXone (ROCEPHIN) 2 g in sodium chloride 0.9 % 100 mL IVPB  Status:  Discontinued        2 g 200 mL/hr over 30 Minutes Intravenous Every 24 hours 02/23/23 1232 02/23/23 2343        MEDICATIONS: Scheduled Meds:  atorvastatin  40 mg Oral Daily   carvedilol  3.125 mg Oral BID WC   clopidogrel  75 mg Oral Daily   enoxaparin (LOVENOX) injection  40 mg Subcutaneous Q24H   finasteride  5 mg Oral Daily   folic acid  1 mg Oral Daily   hydrALAZINE  50 mg Oral Q8H   isosorbide mononitrate  30 mg Oral Daily   pantoprazole  40 mg Oral Daily   tamsulosin  0.4 mg Oral QPC supper   Continuous Infusions: PRN Meds:.acetaminophen **OR** acetaminophen, haloperidol lactate, hydrALAZINE, ondansetron **OR** ondansetron (ZOFRAN) IV, mouth rinse, senna-docusate   I have personally reviewed following labs and imaging studies  LABORATORY DATA: CBC: Recent Labs  Lab 03/11/23 0323  WBC 8.3  HGB 10.7*  HCT 32.9*  MCV 90.9  PLT 306    Basic Metabolic Panel: Recent Labs  Lab 03/11/23 0323  NA 142  K 3.7  CL 105  CO2 26  GLUCOSE 98  BUN 19  CREATININE 1.21  CALCIUM 10.0    GFR: Estimated Creatinine Clearance: 42.1 mL/min (by C-G formula based on SCr of 1.21 mg/dL).  Liver Function Tests: No results for input(s): "AST", "ALT", "ALKPHOS", "BILITOT", "PROT", "ALBUMIN" in the last 168 hours. No results for input(s): "LIPASE", "AMYLASE" in the last 168 hours. No results for input(s): "AMMONIA" in the last 168 hours.  Coagulation Profile: No results for input(s): "INR", "PROTIME" in the last 168 hours.  Cardiac Enzymes: No results for input(s): "CKTOTAL", "CKMB",  "CKMBINDEX", "TROPONINI" in the last 168 hours.  BNP (last 3 results) No results for input(s): "PROBNP" in the last 8760 hours.  Lipid Profile: No results for input(s): "CHOL", "HDL", "LDLCALC", "TRIG", "CHOLHDL", "LDLDIRECT" in the last 72 hours.  Thyroid Function Tests: No results for input(s): "TSH", "T4TOTAL", "FREET4", "T3FREE", "THYROIDAB" in the last 72 hours.  Anemia Panel: No results for input(s): "VITAMINB12", "FOLATE", "FERRITIN", "TIBC", "IRON", "RETICCTPCT" in the last 72 hours.  Urine analysis:    Component Value Date/Time   COLORURINE YELLOW 02/23/2023 0605   APPEARANCEUR HAZY (A) 02/23/2023 0605   LABSPEC 1.018 02/23/2023 0605   PHURINE 5.0 02/23/2023 0605   GLUCOSEU NEGATIVE 02/23/2023 0347  HGBUR NEGATIVE 02/23/2023 0605   BILIRUBINUR NEGATIVE 02/23/2023 0605   KETONESUR NEGATIVE 02/23/2023 0605   PROTEINUR 100 (A) 02/23/2023 0605   NITRITE NEGATIVE 02/23/2023 0605   LEUKOCYTESUR NEGATIVE 02/23/2023 0605    Sepsis Labs: Lactic Acid, Venous No results found for: "LATICACIDVEN"  MICROBIOLOGY: No results found for this or any previous visit (from the past 240 hour(s)).  RADIOLOGY STUDIES/RESULTS: No results found.   LOS: 19 days   Jeoffrey Massed, MD  [facility]    To contact the attending provider between 7A-7P or the covering provider during after hours 7P-7A, please log into the web site [email or web address] and access using universal [place] password for that web site. If you do not have the password, please call the hospital operator.  03/13/2023, 12:06 PM

## 2023-03-13 NOTE — TOC Progression Note (Signed)
Transition of Care [facility]) - Progression Note    Patient Details  Name: Dennis [name] MRN: [id number] Date of Birth: 12/11/1945  [facility] CM/SW Contact  Mearl Latin, LCSW Phone Number: 03/13/2023, 12:16 PM  Clinical Narrative:    CSW reached out to [facility] with [facility]PS to request she update CSW once patient's significant other's neurologic testing results so we can determine who is able to sign patient into SNF.    Expected Discharge Plan: Skilled Nursing Facility Barriers to Discharge: Continued Medical Work up, Requiring sitter/restraints, SNF Pending bed offer, Unsafe home situation  Expected Discharge Plan and Services In-house Referral: Clinical Social Work   Post Acute Care Choice: Skilled Nursing Facility Living arrangements for the past 2 months: Single Family Home                                       Social Determinants of Health (SDOH) Interventions SDOH Screenings   Food Insecurity: No Food Insecurity (07/26/2020)   Received from [facility]  Transportation Needs: No Transportation Needs (04/04/2022)   Received from [facility], [facility]  Tobacco Use: Medium Risk (02/22/2023)    Readmission Risk Interventions     No data to display

## 2023-03-13 NOTE — Progress Notes (Signed)
Physical Therapy Treatment Patient Details Name: Dennis [name] MRN: [id number] DOB: 1945-11-23 Today's Date: 03/13/2023   History of Present Illness 77 yo male presenting to ED 10/5 with AMS. CTH and MRI brain negative. EEG was negative for seizure activity, and imaging of the brain reveals no acute abnormality. LP completed 10/9, CSF benign.  LTM discontinued. PMH includes HLD, BPH, HTN, rheumatoid arthritis, COPD, Barrett's esophagus, OSA, prior CVA, dysarthria, LLE weakness, dementia.    PT Comments  Pt received in recliner, calling out to PTA in hallway due to frustration with prolonged positioning in chair, PTA looked for nursing staff but they were unavailable at time of session and pt requesting to work on standing/gait with therapist so pt seen for his safety while reorienting him to situation and repositioning for comfort. Pt with improved standing balance this date without AD and good activity tolerance, but still needing up to minA for stability with some DGI tasks due to occasional R LOB when not using RW. Pt does not use RW at baseline. Pt scored 12/24 on Dynamic Gait Index. Scores of 19 or less are predictive of falls in older community living adults. Pt agreeable to sit back in chair while eating lunch at end of session, sposue/neighbor arrived to visit him after session as well. Pt continues to benefit from PT services to progress toward functional mobility goals.    If plan is discharge home, recommend the following: Assistance with cooking/housework;Direct supervision/assist for medications management;Direct supervision/assist for financial management;Assist for transportation;Help with stairs or ramp for entrance;Supervision due to cognitive status;A lot of help with walking and/or transfers;A little help with bathing/dressing/bathroom   Can travel by private vehicle     Yes  Equipment Recommendations  Other (comment) (TBD)    Recommendations for Other Services        Precautions / Restrictions Precautions Precautions: Fall Precaution Comments: Waxing/waning cognition d/t dementia Restrictions Weight Bearing Restrictions: No     Mobility  Bed Mobility Overal bed mobility: Needs Assistance             General bed mobility comments: pt received/remained in recliner    Transfers Overall transfer level: Needs assistance Equipment used: None Transfers: Sit to/from Stand Sit to Stand: Contact guard assist           General transfer comment: from chair, pt using BUE to push to standing    Ambulation/Gait Ambulation/Gait assistance: Min assist Gait Distance (Feet): 150 Feet (x2 with break) Assistive device: None Gait Pattern/deviations: Step-to pattern, Step-through pattern, Drifts right/left, Narrow base of support Gait velocity: decreased     General Gait Details: Without AD as per pt he does not use RW at home. Pt holding on to furniture for support in the room. Pt with increased instability during dual task (likely due to increased cognitive load) and had difficulty wayfinding to locate his room despite reminders of his room # about 5 times. Pt with occasional R LOB needing minA to recover. PT wtih good ability to change speed when cued but some difficulty with other DGI tasks, defer stairs this date due to pt anxiety regarding lunch and lunch arrived during gait trial.   Stairs             Wheelchair Mobility     Tilt Bed    Modified Rankin (Stroke Patients Only)       Balance Overall balance assessment: Needs assistance Sitting-balance support: Feet supported, No upper extremity supported Sitting balance-Leahy Scale: Good  Standing balance support: During functional activity, No upper extremity supported Standing balance-Leahy Scale: Poor Standing balance comment: fair standing balance unsupported, poor with higher level balance task                 Standardized Balance Assessment Standardized  Balance Assessment : Dynamic Gait Index   Dynamic Gait Index Level Surface: Mild Impairment Change in Gait Speed: Normal Gait with Horizontal Head Turns: Moderate Impairment Gait with Vertical Head Turns: Moderate Impairment Gait and Pivot Turn: Moderate Impairment Step Over Obstacle: Moderate Impairment Step Around Obstacles: Mild Impairment Steps: Moderate Impairment (simulated in room) Total Score: 12      Cognition Arousal: Alert Behavior During Therapy: WFL for tasks assessed/performed Overall Cognitive Status: Impaired/Different from baseline Area of Impairment: Memory, Following commands, Safety/judgement, Problem solving, Awareness, Orientation                 Orientation Level: Disoriented to, Situation, Time   Memory: Decreased recall of precautions, Decreased short-term memory Following Commands: Follows one step commands with increased time Safety/Judgement: Decreased awareness of safety, Decreased awareness of deficits Awareness: Intellectual Problem Solving: Requires verbal cues, Difficulty sequencing General Comments: Pt irritable upon PTA arrival to room due to his landline phone not working and bottom sore from sitting since AM. Pt attempting to get up out of recliner on his own and no nursing staff available to assist at time of session (RN was on lunch and NT was busy). Pt able to be redirected and more calm at end of session, PTA brought him a working phone and called neighbor who is a caregiver for him and spouse and she states she is on her way.        Exercises      General Comments General comments (skin integrity, edema, etc.): SpO2 and HR WFL on RA per portable pulse oximeter      Pertinent Vitals/Pain Pain Assessment Pain Assessment: Faces Faces Pain Scale: Hurts a little bit Pain Location: bottom from prolonged sitting in the chair Pain Descriptors / Indicators: Discomfort, Guarding, Grimacing Pain Intervention(s): Monitored during  session, Repositioned (offered to get him back to bed but his food had arrived so he wanted to get back to recliner to eat)    Home Living                          Prior Function            PT Goals (current goals can now be found in the care plan section) Acute Rehab PT Goals Patient Stated Goal: To go home and live with my wife. PT Goal Formulation: Patient unable to participate in goal setting Time For Goal Achievement: 03/24/23 Progress towards PT goals: Progressing toward goals    Frequency    Min 1X/week      PT Plan         AM-PAC PT "6 Clicks" Mobility   Outcome Measure  Help needed turning from your back to your side while in a flat bed without using bedrails?: A Little Help needed moving from lying on your back to sitting on the side of a flat bed without using bedrails?: A Little Help needed moving to and from a bed to a chair (including a wheelchair)?: A Little Help needed standing up from a chair using your arms (e.g., wheelchair or bedside chair)?: A Little Help needed to walk in hospital room?: A Lot (without AD) Help needed climbing 3-5 steps  with a railing? : A Lot (loss of balance) 6 Click Score: 16    End of Session Equipment Utilized During Treatment: Gait belt Activity Tolerance: Patient tolerated treatment well Patient left: in chair;with call bell/phone within reach;with chair alarm set (set up to eat his lunch) Nurse Communication: Mobility status;Other (comment) (pt irritable from sitting in chair, wants back to bed after finishing his lunch.) PT Visit Diagnosis: Unsteadiness on feet (R26.81);Other abnormalities of gait and mobility (R26.89);History of falling (Z91.81);Muscle weakness (generalized) (M62.81)     Time: 8469-6295 PT Time Calculation (min) (ACUTE ONLY): 19 min  Charges:    $Gait Training: 8-22 mins PT General Charges $$ ACUTE PT VISIT: 1 Visit                     Chenita Ruda P., PTA Acute Rehabilitation  Services Secure Chat Preferred 9a-5:30pm Office: [phone]    [facility] K[facility] 03/13/2023, 3:26 PM

## 2023-03-13 NOTE — Plan of Care (Signed)

## 2023-03-14 DIAGNOSIS — R410 Disorientation, unspecified: Secondary | ICD-10-CM | POA: Diagnosis not present

## 2023-03-14 NOTE — TOC CM/SW Note (Signed)
    Durable Medical Equipment  (From admission, onward)           Start     Ordered   03/14/23 1352  For home use only DME Bedside commode  Once       Question:  Patient needs a bedside commode to treat with the following condition  Answer:  CVA (cerebral vascular accident) (HCC)   03/14/23 1351   03/14/23 1351  For home use only DME Walker rolling  Once       Question Answer Comment  Walker: With 5 Inch Wheels   Patient needs a walker to treat with the following condition History of CVA (cerebrovascular accident)      03/14/23 1351

## 2023-03-14 NOTE — Progress Notes (Signed)
PROGRESS NOTE        PATIENT DETAILS Name: Dennis [name] Age: 77 y.o. Sex: male Date of Birth: September 12, 1945 Admit Date: 02/22/2023 Admitting Physician Gery Pray, MD PCP:Pcp, No  Brief Summary: Patient is a 77 y.o.  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  Significant events: 10/5>> admit to [facility]  Significant studies: 10/5>> TSH: Normal 10/6>> MRI brain: No acute abnormality-moderate generalized atrophy. 10/6>> RPR: Nonreactive 10/6>> ammonia: 12 10/6>> vitamin B12: 611 10/6>> vitamin B1: 126 10/7>> CSF protein 48 10/7>> echo: EF 45-50% 10/8-10/9>> LTM EEG: No seizures  Significant microbiology data: 10/7>> CSF meningitis/encephalitis panel: Negative 10/7>> CSF culture: Negative 10/7>> CSF autoimmune encephalopathy panel: Negative  Procedures: 10/9>> lumbar puncture by IR  Consults: Neurology  Subjective: Sitting up in bed-no complaints-inquiring when he can go home.  Objective: Vitals: Blood pressure (!) 140/75, pulse 86, temperature 97.8 F (36.6 C), temperature source Oral, resp. rate 20, height 5\' 4"  (1.626 m), weight 68 kg, SpO2 91%.   Exam: Awake/alert-not in any distress. Moving all 4 extremities.  Pertinent Labs/Radiology:    Latest Ref Rng & Units 03/11/2023    3:23 AM 03/05/2023    3:38 AM 03/03/2023    3:13 AM  CBC  WBC 4.0 - 10.5 K/uL 8.3  8.3  8.4   Hemoglobin 13.0 - 17.0 g/dL 30.8  65.7  84.6   Hematocrit 39.0 - 52.0 % 32.9  34.0  32.8   Platelets 150 - 400 K/uL 306  317  286     Lab Results  Component Value Date   NA 142 03/11/2023   K 3.7 03/11/2023   CL 105 03/11/2023    CO2 26 03/11/2023      Assessment/Plan: Worsening confusion-likely due to progressive-but undiagnosed dementia Extensive neuroimaging-workup as above all negative for any significant causes Felt to have undiagnosed dementia at this point Child psychotherapist following-awaiting safe disposition  AKI Hemodynamically mediated Resolved  History of RA Stable  BPH Proscar/Flomax  HTN Stable Coreg/hydralazine/Imdur  Prior history of CVA Plavix/statin  HLD Statin  BMI: Estimated body mass index is 25.73 kg/m as calculated from the following:   Height as of this encounter: 5\' 4"  (1.626 m).   Weight as of this encounter: 68 kg.   Code status:   Code Status: Full Code   DVT Prophylaxis: enoxaparin (LOVENOX) injection 40 mg Start: 03/08/23 0845 Place and maintain sequential compression device Start: 02/23/23 1015   Family Communication: None at bedside   Disposition Plan: Status is: Inpatient Remains inpatient appropriate because: Severity of illness   Planned Discharge Destination:Skilled nursing facility versus home   Diet: Diet Order             DIET DYS 3 Room service appropriate? No; Fluid consistency: Thin  Diet effective now                     Antimicrobial agents: Anti-infectives (From admission, onward)    Start     Dose/Rate Route Frequency Ordered Stop   02/24/23 2200  vancomycin (VANCOCIN) IVPB 1000 mg/200 mL premix  Status:  Discontinued  1,000 mg 200 mL/hr over 60 Minutes Intravenous Every 24 hours 02/23/23 2355 02/27/23 1037   02/24/23 0600  cefTRIAXone (ROCEPHIN) 2 g in sodium chloride 0.9 % 100 mL IVPB  Status:  Discontinued        2 g 200 mL/hr over 30 Minutes Intravenous Every 12 hours 02/23/23 2343 02/27/23 1037   02/24/23 0045  acyclovir (ZOVIRAX) 680 mg in dextrose 5 % 100 mL IVPB  Status:  Discontinued        10 mg/kg  68 kg 113.6 mL/hr over 60 Minutes Intravenous Every 12 hours 02/23/23 2345 02/27/23 1223   02/24/23 0030   vancomycin (VANCOREADY) IVPB 1500 mg/300 mL        1,500 mg 150 mL/hr over 120 Minutes Intravenous  Once 02/23/23 2336 02/24/23 0330   02/24/23 0030  ampicillin (OMNIPEN) 2 g in sodium chloride 0.9 % 100 mL IVPB  Status:  Discontinued        2 g 300 mL/hr over 20 Minutes Intravenous Every 6 hours 02/23/23 2340 02/27/23 1037   02/23/23 1400  cefTRIAXone (ROCEPHIN) 2 g in sodium chloride 0.9 % 100 mL IVPB  Status:  Discontinued        2 g 200 mL/hr over 30 Minutes Intravenous Every 24 hours 02/23/23 1232 02/23/23 2343        MEDICATIONS: Scheduled Meds:  atorvastatin  40 mg Oral Daily   carvedilol  3.125 mg Oral BID WC   clopidogrel  75 mg Oral Daily   enoxaparin (LOVENOX) injection  40 mg Subcutaneous Q24H   finasteride  5 mg Oral Daily   folic acid  1 mg Oral Daily   hydrALAZINE  50 mg Oral Q8H   isosorbide mononitrate  30 mg Oral Daily   pantoprazole  40 mg Oral Daily   tamsulosin  0.4 mg Oral QPC supper   Continuous Infusions: PRN Meds:.acetaminophen **OR** acetaminophen, haloperidol lactate, hydrALAZINE, ondansetron **OR** ondansetron (ZOFRAN) IV, mouth rinse, senna-docusate   I have personally reviewed following labs and imaging studies  LABORATORY DATA: CBC: Recent Labs  Lab 03/11/23 0323  WBC 8.3  HGB 10.7*  HCT 32.9*  MCV 90.9  PLT 306    Basic Metabolic Panel: Recent Labs  Lab 03/11/23 0323  NA 142  K 3.7  CL 105  CO2 26  GLUCOSE 98  BUN 19  CREATININE 1.21  CALCIUM 10.0    GFR: Estimated Creatinine Clearance: 42.1 mL/min (by C-G formula based on SCr of 1.21 mg/dL).  Liver Function Tests: No results for input(s): "AST", "ALT", "ALKPHOS", "BILITOT", "PROT", "ALBUMIN" in the last 168 hours. No results for input(s): "LIPASE", "AMYLASE" in the last 168 hours. No results for input(s): "AMMONIA" in the last 168 hours.  Coagulation Profile: No results for input(s): "INR", "PROTIME" in the last 168 hours.  Cardiac Enzymes: No results for  input(s): "CKTOTAL", "CKMB", "CKMBINDEX", "TROPONINI" in the last 168 hours.  BNP (last 3 results) No results for input(s): "PROBNP" in the last 8760 hours.  Lipid Profile: No results for input(s): "CHOL", "HDL", "LDLCALC", "TRIG", "CHOLHDL", "LDLDIRECT" in the last 72 hours.  Thyroid Function Tests: No results for input(s): "TSH", "T4TOTAL", "FREET4", "T3FREE", "THYROIDAB" in the last 72 hours.  Anemia Panel: No results for input(s): "VITAMINB12", "FOLATE", "FERRITIN", "TIBC", "IRON", "RETICCTPCT" in the last 72 hours.  Urine analysis:    Component Value Date/Time   COLORURINE YELLOW 02/23/2023 0605   APPEARANCEUR HAZY (A) 02/23/2023 0605   LABSPEC 1.018 02/23/2023 0605   PHURINE 5.0 02/23/2023 1610  GLUCOSEU NEGATIVE 02/23/2023 0605   HGBUR NEGATIVE 02/23/2023 0605   BILIRUBINUR NEGATIVE 02/23/2023 0605   KETONESUR NEGATIVE 02/23/2023 0605   PROTEINUR 100 (A) 02/23/2023 0605   NITRITE NEGATIVE 02/23/2023 0605   LEUKOCYTESUR NEGATIVE 02/23/2023 0605    Sepsis Labs: Lactic Acid, Venous No results found for: "LATICACIDVEN"  MICROBIOLOGY: No results found for this or any previous visit (from the past 240 hour(s)).  RADIOLOGY STUDIES/RESULTS: No results found.   LOS: 20 days   Jeoffrey Massed, MD  [facility]    To contact the attending provider between 7A-7P or the covering provider during after hours 7P-7A, please log into the web site [email or web address] and access using universal [place] password for that web site. If you do not have the password, please call the hospital operator.  03/14/2023, 9:19 AM

## 2023-03-14 NOTE — Plan of Care (Signed)

## 2023-03-14 NOTE — TOC Progression Note (Signed)
Transition of Care [facility]) - Progression Note    Patient Details  Name: BRYOR STRIANO MRN: [id number] Date of Birth: 1945-08-21  Transition of Care [facility]) CM/SW Contact  Gordy Clement, RN Phone Number: 03/14/2023, 1:53 PM  Clinical Narrative:     Patient will DC to home on Sunday . APS worker spoke with CSW (see note) RNCM has ordered a rolling walker and a bedside commode to be delivered bedside.  Home health PT and OT will be provided by  [facility] AVS updated  Family will transport       Expected Discharge Plan: Home w Home Health Services Barriers to Discharge: Continued Medical Work up  Expected Discharge Plan and Services In-house Referral: Clinical Social Work Discharge Planning Services: CM Consult Post Acute Care Choice: Skilled Nursing Facility Living arrangements for the past 2 months: Single Family Home                                       Social Determinants of Health (SDOH) Interventions SDOH Screenings   Food Insecurity: No Food Insecurity (07/26/2020)   Received from [facility], [facility]  Transportation Needs: No Transportation Needs (04/04/2022)   Received from [facility], [facility]  Tobacco Use: Medium Risk (02/22/2023)    Readmission Risk Interventions     No data to display

## 2023-03-14 NOTE — Progress Notes (Signed)
Mobility Specialist Progress Note;   03/14/23 1510  Mobility  Activity Ambulated with assistance in hallway  Level of Assistance Contact guard assist, steadying assist  Assistive Device Front wheel walker  Distance Ambulated (ft) 350 ft  Activity Response Tolerated well  Mobility Referral Yes  $Mobility charge 1 Mobility  Mobility Specialist Start Time (ACUTE ONLY) 1510  Mobility Specialist Stop Time (ACUTE ONLY) 1535  Mobility Specialist Time Calculation (min) (ACUTE ONLY) 25 min   Pt agreeable to mobility. Required minG assistance during ambulation. Mod verbal cues required to keep pt on task, hand placement on walker, and directions. Impulsive and got distracted easily by people in the hallway during session. Asx throughout session. Pt back in chair with all needs met. Alarm on.   Caesar Bookman Mobility Specialist Please contact via SecureChat or Rehab Office [phone]

## 2023-03-14 NOTE — TOC Progression Note (Signed)
Transition of Care [facility]) - Progression Note    Patient Details  Name: Dennis [name] MRN: [id number] Date of Birth: August 16, 1945  Transition of Care [facility]) CM/SW Contact  Mearl Latin, LCSW Phone Number: 03/14/2023, 1:47 PM  Clinical Narrative:    CSW spoke with [facility] with APS. She stated that the plan will be to send patient home with home health services as significant other passed her evaluation. She will continue working with patient and significant other for in home services and re-evaluation.   CSW and MD met with patient, neighbor [name], and significant other at bedside to discuss plan. Neighbor confirmed that she and her daughter will help the couple (working with Arlina Robes to see if daughter can get paid to assist). Significant other reported not wanting strangers in her home but team reiterated that it is necessary and that she is not able to drive anymore, which she did not agree with. Neighbor has hidden keys. Neighbor requesting Sunday discharge. Patient requires walker and 3in1 delivered to hospital room. RNCM assisting in locating home health and DME. CSW updated Nichole with APS. MD to send medications to [facility] pharmacy.    Expected Discharge Plan: Home w Home Health Services Barriers to Discharge: Continued Medical Work up  Expected Discharge Plan and Services In-house Referral: Clinical Social Work Discharge Planning Services: CM Consult Post Acute Care Choice: Skilled Nursing Facility Living arrangements for the past 2 months: Single Family Home                                       Social Determinants of Health (SDOH) Interventions SDOH Screenings   Food Insecurity: No Food Insecurity (07/26/2020)   [facility], [facility]  Transportation Needs: No Transportation Needs (04/04/2022)   [facility], [facility]  Tobacco Use: Medium Risk (02/22/2023)    Readmission Risk Interventions     No data to display

## 2023-03-15 DIAGNOSIS — R410 Disorientation, unspecified: Secondary | ICD-10-CM | POA: Diagnosis not present

## 2023-03-15 DIAGNOSIS — E7801 Familial hypercholesterolemia: Secondary | ICD-10-CM | POA: Diagnosis not present

## 2023-03-15 DIAGNOSIS — I10 Essential (primary) hypertension: Secondary | ICD-10-CM | POA: Diagnosis not present

## 2023-03-15 NOTE — Plan of Care (Signed)

## 2023-03-15 NOTE — Progress Notes (Signed)
PROGRESS NOTE        PATIENT DETAILS Name: Dennis [name] Age: 77 y.o. Sex: male Date of Birth: June 19, 1945 Admit Date: 02/22/2023 Admitting Physician Gery Pray, MD PCP:Pcp, No  Brief Summary: Patient is a 77 y.o.  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  Significant events: 10/5>> admit to [facility]  Significant studies: 10/5>> TSH: Normal 10/6>> MRI brain: No acute abnormality-moderate generalized atrophy. 10/6>> RPR: Nonreactive 10/6>> ammonia: 12 10/6>> vitamin B12: 611 10/6>> vitamin B1: 126 10/7>> CSF protein 48 10/7>> echo: EF 45-50% 10/8-10/9>> LTM EEG: No seizures  Significant microbiology data: 10/7>> CSF meningitis/encephalitis panel: Negative 10/7>> CSF culture: Negative 10/7>> CSF autoimmune encephalopathy panel: Negative  Procedures: 10/9>> lumbar puncture by IR  Consults: Neurology  Subjective: Sitting up in bed-no complaints-pleasantly confused.  Objective: Vitals: Blood pressure (!) 149/48, pulse 99, temperature 98.2 F (36.8 C), temperature source Oral, resp. rate 18, height 5\' 4"  (1.626 m), weight 68 kg, SpO2 93%.   Exam: Awake/alert-not in any distress. Moving all 4 extremities.  Pertinent Labs/Radiology:    Latest Ref Rng & Units 03/11/2023    3:23 AM 03/05/2023    3:38 AM 03/03/2023    3:13 AM  CBC  WBC 4.0 - 10.5 K/uL 8.3  8.3  8.4   Hemoglobin 13.0 - 17.0 g/dL 78.2  95.6  21.3   Hematocrit 39.0 - 52.0 % 32.9  34.0  32.8   Platelets 150 - 400 K/uL 306  317  286     Lab Results  Component Value Date   NA 142 03/11/2023   K 3.7 03/11/2023   CL 105 03/11/2023   CO2 26  03/11/2023      Assessment/Plan: Worsening confusion-likely due to progressive-but undiagnosed dementia Extensive neuroimaging-workup as above all negative for any significant causes Felt to have undiagnosed dementia at this point  AKI Hemodynamically mediated Resolved  History of RA Stable  BPH Proscar/Flomax  HTN Stable Coreg/hydralazine/Imdur  Prior history of CVA Plavix/statin  HLD Statin  Disposition issues Met with significant other/neighbor along with the [facility] team/social worker at bedside on 10/25.  Per social work-patient's significant other was evaluated by outpatient MD/neurology-and thought to have capacity-Per social worker-[facility] involved-and okay for patient to be discharged home in the care of his significant other. After much discussion-family will pick him up on 10/27-Home health already ordered.  BMI: Estimated body mass index is 25.73 kg/m as calculated from the following:   Height as of this encounter: 5\' 4"  (1.626 m).   Weight as of this encounter: 68 kg.   Code status:   Code Status: Full Code   DVT Prophylaxis: enoxaparin (LOVENOX) injection 40 mg Start: 03/08/23 0845 Place and maintain sequential compression device Start: 02/23/23 1015   Family Communication: None at bedside   Disposition Plan: Status is: Inpatient Remains inpatient appropriate because: Severity of illness   Planned Discharge Destination:Skilled nursing facility versus home   Diet: Diet Order             DIET DYS 3 Room service appropriate? No; Fluid consistency: Thin  Diet effective now  Antimicrobial agents: Anti-infectives (From admission, onward)    Start     Dose/Rate Route Frequency Ordered Stop   02/24/23 2200  vancomycin (VANCOCIN) IVPB 1000 mg/200 mL premix  Status:  Discontinued        1,000 mg 200 mL/hr over 60 Minutes Intravenous Every 24 hours 02/23/23 2355 02/27/23 1037   02/24/23 0600  cefTRIAXone (ROCEPHIN) 2 g in  sodium chloride 0.9 % 100 mL IVPB  Status:  Discontinued        2 g 200 mL/hr over 30 Minutes Intravenous Every 12 hours 02/23/23 2343 02/27/23 1037   02/24/23 0045  acyclovir (ZOVIRAX) 680 mg in dextrose 5 % 100 mL IVPB  Status:  Discontinued        10 mg/kg  68 kg 113.6 mL/hr over 60 Minutes Intravenous Every 12 hours 02/23/23 2345 02/27/23 1223   02/24/23 0030  vancomycin (VANCOREADY) IVPB 1500 mg/300 mL        1,500 mg 150 mL/hr over 120 Minutes Intravenous  Once 02/23/23 2336 02/24/23 0330   02/24/23 0030  ampicillin (OMNIPEN) 2 g in sodium chloride 0.9 % 100 mL IVPB  Status:  Discontinued        2 g 300 mL/hr over 20 Minutes Intravenous Every 6 hours 02/23/23 2340 02/27/23 1037   02/23/23 1400  cefTRIAXone (ROCEPHIN) 2 g in sodium chloride 0.9 % 100 mL IVPB  Status:  Discontinued        2 g 200 mL/hr over 30 Minutes Intravenous Every 24 hours 02/23/23 1232 02/23/23 2343        MEDICATIONS: Scheduled Meds:  atorvastatin  40 mg Oral Daily   carvedilol  3.125 mg Oral BID WC   clopidogrel  75 mg Oral Daily   enoxaparin (LOVENOX) injection  40 mg Subcutaneous Q24H   finasteride  5 mg Oral Daily   folic acid  1 mg Oral Daily   hydrALAZINE  50 mg Oral Q8H   isosorbide mononitrate  30 mg Oral Daily   pantoprazole  40 mg Oral Daily   tamsulosin  0.4 mg Oral QPC supper   Continuous Infusions: PRN Meds:.acetaminophen **OR** acetaminophen, haloperidol lactate, hydrALAZINE, ondansetron **OR** ondansetron (ZOFRAN) IV, mouth rinse, senna-docusate   I have personally reviewed following labs and imaging studies  LABORATORY DATA: CBC: Recent Labs  Lab 03/11/23 0323  WBC 8.3  HGB 10.7*  HCT 32.9*  MCV 90.9  PLT 306    Basic Metabolic Panel: Recent Labs  Lab 03/11/23 0323  NA 142  K 3.7  CL 105  CO2 26  GLUCOSE 98  BUN 19  CREATININE 1.21  CALCIUM 10.0    GFR: Estimated Creatinine Clearance: 42.1 mL/min (by C-G formula based on SCr of 1.21 mg/dL).  Liver  Function Tests: No results for input(s): "AST", "ALT", "ALKPHOS", "BILITOT", "PROT", "ALBUMIN" in the last 168 hours. No results for input(s): "LIPASE", "AMYLASE" in the last 168 hours. No results for input(s): "AMMONIA" in the last 168 hours.  Coagulation Profile: No results for input(s): "INR", "PROTIME" in the last 168 hours.  Cardiac Enzymes: No results for input(s): "CKTOTAL", "CKMB", "CKMBINDEX", "TROPONINI" in the last 168 hours.  BNP (last 3 results) No results for input(s): "PROBNP" in the last 8760 hours.  Lipid Profile: No results for input(s): "CHOL", "HDL", "LDLCALC", "TRIG", "CHOLHDL", "LDLDIRECT" in the last 72 hours.  Thyroid Function Tests: No results for input(s): "TSH", "T4TOTAL", "FREET4", "T3FREE", "THYROIDAB" in the last 72 hours.  Anemia Panel: No results for input(s): "VITAMINB12", "FOLATE", "FERRITIN", "  TIBC", "IRON", "RETICCTPCT" in the last 72 hours.  Urine analysis:    Component Value Date/Time   COLORURINE YELLOW 02/23/2023 0605   APPEARANCEUR HAZY (A) 02/23/2023 0605   LABSPEC 1.018 02/23/2023 0605   PHURINE 5.0 02/23/2023 0605   GLUCOSEU NEGATIVE 02/23/2023 0605   HGBUR NEGATIVE 02/23/2023 0605   BILIRUBINUR NEGATIVE 02/23/2023 0605   KETONESUR NEGATIVE 02/23/2023 0605   PROTEINUR 100 (A) 02/23/2023 0605   NITRITE NEGATIVE 02/23/2023 0605   LEUKOCYTESUR NEGATIVE 02/23/2023 0605    Sepsis Labs: Lactic Acid, Venous No results found for: "LATICACIDVEN"  MICROBIOLOGY: No results found for this or any previous visit (from the past 240 hour(s)).  RADIOLOGY STUDIES/RESULTS: No results found.   LOS: 21 days   Jeoffrey Massed, MD  [facility]    To contact the attending provider between 7A-7P or the covering provider during after hours 7P-7A, please log into the web site [email or web address] and access using universal Gray password for that web site. If you do not have the password, please call the hospital operator.  03/15/2023,  12:34 PM

## 2023-03-16 DIAGNOSIS — F03918 Unspecified dementia, unspecified severity, with other behavioral disturbance: Secondary | ICD-10-CM | POA: Diagnosis not present

## 2023-03-16 DIAGNOSIS — E782 Mixed hyperlipidemia: Secondary | ICD-10-CM

## 2023-03-16 DIAGNOSIS — I10 Essential (primary) hypertension: Secondary | ICD-10-CM | POA: Diagnosis not present

## 2023-03-16 DIAGNOSIS — R410 Disorientation, unspecified: Secondary | ICD-10-CM | POA: Diagnosis not present

## 2023-03-16 MED ORDER — ATORVASTATIN CALCIUM 40 MG PO TABS: 40.0000 mg | ORAL_TABLET | Freq: Every day | ORAL | 2 refills | Status: AC

## 2023-03-16 MED ORDER — TAMSULOSIN HCL 0.4 MG PO CAPS: 0.4000 mg | ORAL_CAPSULE | Freq: Every day | ORAL | 2 refills | Status: AC

## 2023-03-16 MED ORDER — ISOSORBIDE MONONITRATE ER 30 MG PO TB24: 30.0000 mg | ORAL_TABLET | Freq: Every day | ORAL | 2 refills | Status: DC

## 2023-03-16 MED ORDER — FOLIC ACID 1 MG PO TABS: 1.0000 mg | ORAL_TABLET | Freq: Every day | ORAL | 2 refills | Status: AC

## 2023-03-16 MED ORDER — HYDRALAZINE HCL 50 MG PO TABS: 50.0000 mg | ORAL_TABLET | Freq: Three times a day (TID) | ORAL | 2 refills | Status: DC

## 2023-03-16 MED ORDER — CARVEDILOL 3.125 MG PO TABS: 3.1250 mg | ORAL_TABLET | Freq: Two times a day (BID) | ORAL | 2 refills | Status: DC

## 2023-03-16 MED ORDER — OMEPRAZOLE 20 MG PO CPDR: 20.0000 mg | DELAYED_RELEASE_CAPSULE | Freq: Every day | ORAL | 2 refills | Status: DC

## 2023-03-16 MED ORDER — CLOPIDOGREL BISULFATE 75 MG PO TABS: 75.0000 mg | ORAL_TABLET | Freq: Every day | ORAL | 2 refills | Status: DC

## 2023-03-16 MED ORDER — FINASTERIDE 5 MG PO TABS: 5.0000 mg | ORAL_TABLET | Freq: Every day | ORAL | 2 refills | Status: AC

## 2023-03-16 MED ORDER — CLOBETASOL PROPIONATE 0.05 % EX OINT: 1.0000 | TOPICAL_OINTMENT | Freq: Two times a day (BID) | CUTANEOUS | 0 refills | Status: AC

## 2023-03-16 NOTE — TOC Transition Note (Addendum)
Transition of Care [facility]) - CM/SW Discharge Note   Patient Details  Name: Dennis [name] MRN: [id number] Date of Birth: 29-Jun-1945  Transition of Care [facility]) CM/SW Contact:  Lawerance Sabal, RN Phone Number: 03/16/2023, 9:17 AM   Clinical Narrative:     Patient with order to DC to home. Per chart review patient will have friend to transport home, DME ordered 10/25 to room.  I have notified [facility] of DC for them plan first home visit.  Per [employer] CSW, APS has been notified of DC plan for today.  No PCP, spoke w attending, [facility] will serve as Clinical research associate for home health services  Final next level of care: Home w Home Health Services Barriers to Discharge: No Barriers Identified   Patient Goals and CMS Choice CMS Medicare.gov Compare Post Acute Care list provided to:: Patient Represenative (must comment) Choice offered to / list presented to :  (Significant other, APS)  Discharge Placement                         Discharge Plan and Services Additional resources added to the After Visit Summary for   In-house Referral: Clinical Social Work Discharge Planning Services: CM Consult Post Acute Care Choice: Skilled Nursing Facility          DME Arranged: Bedside commode, Education officer, community spoke with at [employer]: ordered 10/25 per documention of Almond Lint RN CM HH Arranged: RN, PT, OT, Nurse's Aide, Social Work [employer] Agency: [facility] Date HH Agency Contacted: 03/16/23 Time HH Agency Contacted: [phone] Representative spoke with at [facility] Agency: Herbert Seta  Social Determinants of Health (SDOH) Interventions SDOH Screenings   Food Insecurity: No Food Insecurity (07/26/2020)   Received from [facility], [facility]  Transportation Needs: No Transportation Needs (04/04/2022)   Received from [facility], [facility]  Tobacco Use: Medium Risk (02/22/2023)     Readmission Risk Interventions     No data to  display

## 2023-03-16 NOTE — TOC Transition Note (Addendum)
Transition of Care [facility]) - CM/SW Discharge Note   Patient Details  Name: Dennis [name] MRN: [id number] Date of Birth: 1945/08/24  Transition of Care [facility]) CM/SW Contact:  Verna Czech [place], [place] Phone Number: 03/16/2023, 9:58 AM   Clinical Narrative:     [facility] weekend on call contacted, spoke with Koleen Nimrod to confirm patient's discharge home today. Per CSW notes, APS worker Nichole notified on 10/25 of discharge plan for today as well.   Avalin Briley, LCSW Transition of Care   Final next level of care: Home w Home Health Services Barriers to Discharge: No Barriers Identified   Patient Goals and CMS Choice CMS Medicare.gov Compare Post Acute Care list provided to:: Patient Represenative (must comment) Choice offered to / list presented to :  (Significant other, APS)  Discharge Placement                         Discharge Plan and Services Additional resources added to the After Visit Summary for   In-house Referral: Clinical Social Work Discharge Planning Services: CM Consult Post Acute Care Choice: Skilled Nursing Facility          DME Arranged: Bedside commode, Education officer, community spoke with at [employer]: ordered 10/25 per documention of Almond Lint RN CM HH Arranged: RN, PT, OT, Nurse's Aide, Social Work [employer] Agency: [facility] Date HH Agency Contacted: 03/16/23 Time HH Agency Contacted: [phone] Representative spoke with at [facility] [place] Agency: Herbert Seta  Social Determinants of Health (SDOH) Interventions SDOH Screenings   Food Insecurity: No Food Insecurity (07/26/2020)   Received from [facility], [facility]  Transportation Needs: No Transportation Needs (04/04/2022)   Received from [facility], [facility]  Tobacco Use: Medium Risk (02/22/2023)     Readmission Risk Interventions     No data to display

## 2023-03-16 NOTE — Progress Notes (Addendum)
Discharge paperwork reviewed with patient and his neighbor over the phone. Phone number called for transport home was #[phone]. His neighbor reports that she will be here at 12pm. Iv has been removed.

## 2023-03-16 NOTE — Discharge Summary (Signed)
PATIENT DETAILS Name: Dennis [name] Age: 77 y.o. Sex: male Date of Birth: March 22, 1946 MRN: [id number]. Admitting Physician: Gery Pray, MD PCP:Pcp, No  Admit Date: 02/22/2023 Discharge date: 03/16/2023  Recommendations for Outpatient Follow-up:  Follow up with PCP in 1-2 weeks Please obtain CMP/CBC in one week Please follow up on the following pending results:  Admitted From:  Home  Disposition: Home health   Discharge Condition: good  CODE STATUS:   Code Status: Full Code   Diet recommendation:  Diet Order             Diet - low sodium heart healthy           DIET DYS 3 Room service appropriate? No; Fluid consistency: Thin  Diet effective now                    Brief Summary: Patient is a 77 y.o.  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   Significant events: 10/5>> admit to [facility]   Significant studies: 10/5>> TSH: Normal 10/6>> MRI brain: No acute abnormality-moderate generalized atrophy. 10/6>> RPR: Nonreactive 10/6>> ammonia: 12 10/6>> vitamin B12: 611 10/6>> vitamin B1: 126 10/7>> CSF protein 48 10/7>> echo: EF 45-50% 10/8-10/9>> LTM EEG: No seizures   Significant microbiology data: 10/7>> CSF meningitis/encephalitis panel: Negative 10/7>> CSF culture: Negative 10/7>> CSF autoimmune encephalopathy panel: Negative   Procedures: 10/9>> lumbar puncture by IR   Consults: Neurology  Brief Hospital Course: Worsening confusion-likely due to progressive-but undiagnosed dementia Extensive neuroimaging-workup as above all negative for any significant causes Felt to have undiagnosed dementia at this point Will  require outpatient neurology follow-up-epic referral sent to [facility] neurology.   AKI Hemodynamically mediated Resolved   History of RA Stable   BPH Proscar/Flomax   HTN Stable Coreg/hydralazine/Imdur   Prior history of CVA Plavix/statin   HLD Statin  History of rheumatoid arthritis Continue methotrexate Follow-up with your primary rheumatologist   Disposition issues Met with significant other/neighbor along with the [facility] team/social worker at bedside on 10/25.  Per social work-patient's significant other was evaluated by outpatient MD/neurology-and thought to have capacity-Per social worker-[facility] involved-and okay for patient to be discharged home in the care of his significant other. After much discussion-family will pick him up on 10/27-Home health already ordered.   BMI: Estimated body mass index is 25.73 kg/m as calculated from the following:   Height as of this encounter: 5\' 4"  (1.626 m).   Weight as of this encounter: 68 kg.  Discharge Diagnoses:  Principal Problem:   Altered mental state Active Problems:   HLD (hyperlipidemia)   HTN (hypertension)   Difficulty walking   Discharge Instructions:  Activity:  As tolerated with Full fall precautions use walker/cane & assistance as needed   Discharge Instructions     Ambulatory referral to Neurology   Complete by: As directed    An appointment is requested in approximately: 4 weeks   Diet - low sodium heart healthy   Complete by: As directed    Discharge instructions   Complete by: As directed    Follow with Primary MD  in 1-2 weeks  Please get a complete blood count and chemistry panel checked by your Primary MD at your next visit, and again as instructed  by your Primary MD.  A electronic referral has been sent to [facility] neurology-you will get a call from them for a follow-up appointment, if you do not hear from them-please give them a call.  Get Medicines reviewed and adjusted: Please take all  your medications with you for your next visit with your Primary MD  Laboratory/radiological data: Please request your Primary MD to go over all hospital tests and procedure/radiological results at the follow up, please ask your Primary MD to get all Hospital records sent to his/her office.  In some cases, they will be blood work, cultures and biopsy results pending at the time of your discharge. Please request that your primary care M.D. follows up on these results.  Also Note the following: If you experience worsening of your admission symptoms, develop shortness of breath, life threatening emergency, suicidal or homicidal thoughts you must seek medical attention immediately by calling 911 or calling your MD immediately  if symptoms less severe.  You must read complete instructions/literature along with all the possible adverse reactions/side effects for all the Medicines you take and that have been prescribed to you. Take any new Medicines after you have completely understood and accpet all the possible adverse reactions/side effects.   Do not drive when taking Pain medications or sleeping medications (Benzodaizepines)  Do not take more than prescribed Pain, Sleep and Anxiety Medications. It is not advisable to combine anxiety,sleep and pain medications without talking with your primary care practitioner  Special Instructions: If you have smoked or chewed Tobacco  in the last 2 yrs please stop smoking, stop any regular Alcohol  and or any Recreational drug use.  Wear Seat belts while driving.  Please note: You were cared for by a hospitalist during your hospital stay. Once you are discharged, your primary care physician will handle any further medical issues. Please note that NO REFILLS for any discharge medications will be authorized once you are discharged, as it is imperative that you return to your primary care physician (or establish a relationship with a primary care physician if you do  not have one) for your post hospital discharge needs so that they can reassess your need for medications and monitor your lab values.   Increase activity slowly   Complete by: As directed       Allergies as of 03/16/2023       Reactions   Codeine    Shellfish Allergy         Medication List     STOP taking these medications    cefdinir 300 MG capsule Commonly known as: OMNICEF   clonazePAM 0.5 MG tablet Commonly known as: KLONOPIN   doxycycline 100 MG capsule Commonly known as: VIBRAMYCIN   gabapentin 100 MG capsule Commonly known as: NEURONTIN   lidocaine 2 % solution Commonly known as: XYLOCAINE   losartan 25 MG tablet Commonly known as: COZAAR   mirtazapine 15 MG tablet Commonly known as: REMERON   sulfaSALAzine 500 MG tablet Commonly known as: AZULFIDINE   traMADol 50 MG tablet Commonly known as: ULTRAM       TAKE these medications    atorvastatin 40 MG tablet Commonly known as: LIPITOR Take 1 tablet (40 mg total) by mouth daily.   carvedilol 3.125 MG tablet Commonly known as: COREG Take 1 tablet (3.125 mg total) by mouth 2 (two) times daily with a meal. What changed:  how much to take when to take this   clobetasol ointment 0.05 % Commonly known as:  TEMOVATE Apply 1 Application topically 2 (two) times daily for 15 days. For 2 weeks, then stop for 1 week. Repeat as needed.   clopidogrel 75 MG tablet Commonly known as: PLAVIX Take 1 tablet (75 mg total) by mouth daily. What changed:  how much to take when to take this   finasteride 5 MG tablet Commonly known as: PROSCAR Take 1 tablet (5 mg total) by mouth daily. What changed:  how much to take when to take this   folic acid 1 MG tablet Commonly known as: FOLVITE Take 1 tablet (1 mg total) by mouth daily.   hydrALAZINE 50 MG tablet Commonly known as: APRESOLINE Take 1 tablet (50 mg total) by mouth every 8 (eight) hours.   isosorbide mononitrate 30 MG 24 hr tablet Commonly  known as: IMDUR Take 1 tablet (30 mg total) by mouth daily.   methotrexate 50 MG/2ML injection Inject 30 mg/m2 into the skin once a week.   omeprazole 20 MG capsule Commonly known as: PRILOSEC Take 1 capsule (20 mg total) by mouth daily. What changed: when to take this   tamsulosin 0.4 MG Caps capsule Commonly known as: FLOMAX Take 1 capsule (0.4 mg total) by mouth daily. What changed:  how much to take when to take this   triamcinolone cream 0.1 % Commonly known as: KENALOG Apply 1 application topically 2 (two) times daily.               Durable Medical Equipment  (From admission, onward)           Start     Ordered   03/14/23 1352  For home use only DME Bedside commode  Once       Question:  Patient needs a bedside commode to treat with the following condition  Answer:  CVA (cerebral vascular accident) (HCC)   03/14/23 1351   03/14/23 1351  For home use only DME Walker rolling  Once       Question Answer Comment  Walker: With 5 Inch Wheels   Patient needs a walker to treat with the following condition History of CVA (cerebrovascular accident)      03/14/23 1351            Follow-up Information     [place] [place] Follow up.   Why: [facility] will contact you within 48 hours ofDC to arrange a home visit Contact information: [address] [place] [address] [phone]         [facility] Follow up.   Why: [employer] has provided your rolling walker and bedside commode Contact information: [address] [place] [address] [phone]         [facility] Follow up.   Specialty: Neurology Why: Hospital follow up, Office will call with date/time, If you dont hear from them,please give them a call Contact information: [address] [place] [address] [phone]        Primary care practitioner. Schedule an appointment as  soon as possible for a visit in 1 week(s).                 Allergies  Allergen Reactions   Codeine    Shellfish Allergy      Other Procedures/Studies: DG Chest Port 1 View  Result Date: 03/04/2023 CLINICAL DATA:  141880 SOB (shortness of breath) 141880 EXAM: PORTABLE CHEST 1 VIEW COMPARISON:  Today's prior FINDINGS: The heart appears  near the upper limit of normal. Improved aeration of the left lower lobe. No lobar consolidation is evident on today's exam. No pleural effusion. No pneumothorax. Similar coarse interstitial densities throughout the lungs. No acute osseous abnormality. IMPRESSION: Improvement in left lower lobe aeration. No new acute focal pulmonary process identified in the chest. Electronically Signed   By: Olive Bass M.D.   On: 03/04/2023 10:13   DG Chest Port 1 View  Result Date: 03/01/2023 CLINICAL DATA:  77 year old male with history of shortness of breath. EXAM: PORTABLE CHEST 1 VIEW COMPARISON:  Chest x-ray 02/27/2023. FINDINGS: Mild elevation of the left hemidiaphragm. Slight obscuration of the lateral aspect of the left hemidiaphragm focus of atelectasis and/or consolidation. Minimal subsegmental atelectasis or scarring in the periphery of the right lung base. Diffuse interstitial prominence and widespread peribronchial cuffing. No pleural effusions. No pneumothorax. No evidence of pulmonary edema. Heart size is borderline enlarged. The patient is rotated to the left on today's exam, resulting in distortion of the mediastinal contours and reduced diagnostic sensitivity and specificity for mediastinal pathology. Atherosclerotic calcifications are noted in the thoracic aorta. IMPRESSION: 1. Previously noted opacity in the right upper lobe is no longer evident. Persistent opacity at the left base favored to reflect atelectasis or consolidation. 2. Diffuse interstitial prominence and widespread peribronchial cuffing suggestive of chronic bronchitis, similar to prior  studies. 3. Aortic atherosclerosis. Electronically Signed   By: Trudie Reed M.D.   On: 03/01/2023 08:46   DG Chest Port 1 View  Result Date: 02/27/2023 CLINICAL DATA:  77 year old male with history of shortness of breath. EXAM: PORTABLE CHEST 1 VIEW COMPARISON:  Chest x-ray 12/25/2022. FINDINGS: Lung volumes are normal. Opacity in the left base partially obscuring the lateral left hemidiaphragm. Ill-defined opacity also noted in the apex of the right lung (although this could potentially simply reflect prominent first costochondral junction). No pleural effusions. No pneumothorax. No evidence of pulmonary edema. Heart size is borderline enlarged. Upper mediastinal contours are within normal limits. Atherosclerotic calcifications are noted in the thoracic aorta. IMPRESSION: 1. Opacities in the right upper lobe near the apex and in the periphery of the left lung base. These are new compared to the recent prior study from 12/25/2022, concerning for developing areas of airspace consolidation, although an underlying mass in the right upper lobe is not excluded. Followup PA and lateral chest X-ray is recommended in 3-4 weeks following trial of antibiotic therapy to ensure resolution and exclude underlying malignancy. Alternatively, if there is strong clinical concern for malignancy, further evaluation with contrast-enhanced chest CT could be considered at this time. 2. Aortic atherosclerosis. Electronically Signed   By: Trudie Reed M.D.   On: 02/27/2023 07:02   DG FL GUIDED LUMBAR PUNCTURE  Result Date: 02/26/2023 CLINICAL DATA:  77 year old male with complaint of confusion, altered mental status. Request for lumbar puncture. EXAM: LUMBAR PUNCTURE UNDER FLUOROSCOPY PROCEDURE: An appropriate skin entry site was determined fluoroscopically. Operator donned sterile gloves and mask. Skin site was marked, then prepped with Betadine, draped in usual sterile fashion, and infiltrated locally with 1% lidocaine.  A 20 gauge spinal needle advanced into the thecal sac at L2-L3 from a right interlaminar approach. Clear colorless CSF spontaneously returned, with opening pressure of 20 cm water. 5 ml CSF were collected and divided among 4 sterile vials for the requested laboratory studies. The needle was then removed. The patient restless, not following commands during procedure, but ultimately tolerated the procedure well and there were no complications. FLUOROSCOPY:  Radiation Exposure Index (as provided by the fluoroscopic device): 2.6 mGy Kerma IMPRESSION: Technically successful lumbar puncture under fluoroscopy. This exam was performed by Loyce Dys PA-C, and was supervised and interpreted by Ruel Favors, MD. Electronically Signed   By: Judie Petit.  Shick M.D.   On: 02/26/2023 14:48   Overnight EEG with video  Result Date: 02/26/2023 Charlsie Quest, MD     02/26/2023  9:13 AM Patient Name: ALMA POLINO MRN: [id number] Epilepsy Attending: Charlsie Quest Referring Physician/Provider: Marjorie Smolder, NP Duration: 02/25/2023 1353 to 02/26/2023 0900 Patient history: 77yo M with ams getting eeg to evaluate for seizure Level of alertness: lethargic , asleep AEDs during EEG study: None Technical aspects: This EEG study was done with scalp electrodes positioned according to the 10-20 International system of electrode placement. Electrical activity was reviewed with band pass filter of 1-70Hz , sensitivity of 7 uV/mm, display speed of 30mm/sec with a 60Hz  notched filter applied as appropriate. EEG data were recorded continuously and digitally stored.  Video monitoring was available and reviewed as appropriate. Description: No clear posterior dominant rhythm was seen.  Sleep was characterized by sleep symptoms (12 to 14 Hz), maximal frontocentral region.  EEG showed continuous generalized 3 to 5 Hz theta-delta slowing. Hyperventilation and photic stimulation were not performed.   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 No seizures or epileptiform discharges were seen throughout the recording. Charlsie Quest   ECHOCARDIOGRAM COMPLETE  Result Date: 02/24/2023    ECHOCARDIOGRAM REPORT   Patient Name:   NITIN HOLVERSON Date of Exam: 02/24/2023 Medical Rec #:  [id number]     Height:       64.0 in Accession #:    [id number]    Weight:       149.9 lb Date of Birth:  11/09/1945     BSA:          1.731 m Patient Age:    77 years      BP:           118/94 mmHg Patient Gender: M             HR:           88 bpm. Exam Location:  Inpatient Procedure: 2D Echo, Color Doppler and Cardiac Doppler Indications:    Abnormal ECG  History:        Patient has no prior history of Echocardiogram examinations.                 Signs/Symptoms:Altered Mental Status; Risk Factors:Hypertension.  Sonographer:    Milda Smart Referring Phys: [facility]  Sonographer Comments: Image acquisition challenging due to patient body habitus. Pt scanned supine due to restraints. IMPRESSIONS  1. Left ventricular ejection fraction, by estimation, is 45 to 50%. Left ventricular ejection fraction by 2D MOD biplane is 49.5 %. The left ventricle has mildly decreased function. The left ventricle demonstrates regional wall motion abnormalities (see  scoring diagram/findings for description). There is mild left ventricular hypertrophy. Left ventricular diastolic function could not be evaluated.  2. Right ventricular systolic function is normal. The right ventricular size is normal.  3. The mitral valve is normal in structure. No evidence of mitral valve regurgitation. No evidence of mitral stenosis.  4. The aortic valve is normal in structure. There is moderate calcification of the aortic valve. There is moderate thickening of the aortic valve. Aortic valve regurgitation is trivial. No aortic  stenosis is present.  5. The inferior vena cava is normal in size with greater than 50% respiratory  variability, suggesting right atrial pressure of 3 mmHg. FINDINGS  Left Ventricle: Left ventricular ejection fraction, by estimation, is 45 to 50%. Left ventricular ejection fraction by 2D MOD biplane is 49.5 %. The left ventricle has mildly decreased function. The left ventricle demonstrates regional wall motion abnormalities. The left ventricular internal cavity size was normal in size. There is mild left ventricular hypertrophy. Left ventricular diastolic function could not be evaluated.  LV Wall Scoring: The entire anterior wall and entire septum are hypokinetic. The entire lateral wall, entire inferior wall, and apex are normal. Right Ventricle: The right ventricular size is normal. No increase in right ventricular wall thickness. Right ventricular systolic function is normal. Left Atrium: Left atrial size was normal in size. Right Atrium: Right atrial size was normal in size. Pericardium: There is no evidence of pericardial effusion. Mitral Valve: The mitral valve is normal in structure. No evidence of mitral valve regurgitation. No evidence of mitral valve stenosis. Tricuspid Valve: The tricuspid valve is normal in structure. Tricuspid valve regurgitation is not demonstrated. No evidence of tricuspid stenosis. Aortic Valve: The aortic valve is normal in structure. There is moderate calcification of the aortic valve. There is moderate thickening of the aortic valve. Aortic valve regurgitation is trivial. No aortic stenosis is present. Pulmonic Valve: The pulmonic valve was normal in structure. Pulmonic valve regurgitation is not visualized. No evidence of pulmonic stenosis. Aorta: The aortic root is normal in size and structure. Venous: The inferior vena cava is normal in size with greater than 50% respiratory variability, suggesting right atrial pressure of 3 mmHg. IAS/Shunts: No atrial level shunt detected by color flow Doppler.  LEFT VENTRICLE PLAX 2D                        Biplane EF (MOD) LVIDd:          4.10 cm         LV Biplane EF:   Left LVIDs:         3.20 cm                          ventricular LV PW:         1.00 cm                          ejection LV IVS:        1.20 cm                          fraction by LVOT diam:     2.40 cm                          2D MOD LVOT Area:     4.52 cm                         biplane is                                                 49.5 %.  LV Volumes (MOD)  Diastology LV vol d, MOD    39.7 ml       LV e' medial:  5.66 cm/s A2C:                           LV e' lateral: 6.53 cm/s LV vol d, MOD    51.4 ml A4C: LV vol s, MOD    22.3 ml A2C: LV vol s, MOD    21.5 ml A4C: LV SV MOD A2C:   17.4 ml LV SV MOD A4C:   51.4 ml LV SV MOD BP:    22.7 ml RIGHT VENTRICLE RV S prime:     14.90 cm/s TAPSE (M-mode): 1.8 cm LEFT ATRIUM           Index        RIGHT ATRIUM          Index LA diam:      4.30 cm 2.48 cm/m   RA Area:     6.91 cm LA Vol (A2C): 20.7 ml 11.96 ml/m  RA Volume:   10.10 ml 5.84 ml/m LA Vol (A4C): 26.2 ml 15.14 ml/m   AORTA Ao Root diam: 3.50 cm  SHUNTS Systemic Diam: 2.40 cm Chilton Si MD Electronically signed by Chilton Si MD Signature Date/Time: 02/24/2023/4:45:53 PM    Final    MR BRAIN W WO CONTRAST  Result Date: 02/23/2023 CLINICAL DATA:  Delirium. EXAM: MRI HEAD WITHOUT AND WITH CONTRAST TECHNIQUE: Multiplanar, multiecho pulse sequences of the brain and surrounding structures were obtained without and with intravenous contrast. CONTRAST:  7mL GADAVIST GADOBUTROL 1 MMOL/ML IV SOLN COMPARISON:  CT head without contrast 02/22/2023. FINDINGS: Brain: No acute infarct, hemorrhage, or mass lesion is present. Moderate generalized atrophy and diffuse periventricular white matter changes are present bilaterally. Dilated perivascular spaces are present within the basal ganglia. Remote lacunar infarct is present in the right thalamus. The brainstem and cerebellum are within normal limits. The ventricles are proportionate to the degree of  atrophy. No significant extraaxial fluid collection is present. Midline structures are within normal limits. Postcontrast images demonstrate no pathologic enhancement. Vascular: Flow is present in the major intracranial arteries. Skull and upper cervical spine: The craniocervical junction is normal. Upper cervical spine is within normal limits. Marrow signal is unremarkable. Sinuses/Orbits: Bilateral mastoid effusions are present. No obstructing nasopharyngeal lesion is present. A polyp or mucous retention cyst is present within an atelectatic right maxillary sinus. Paranasal sinuses are otherwise clear. The globes and orbits are within normal limits. IMPRESSION: 1. No acute intracranial abnormality. 2. Moderate generalized atrophy and white matter disease likely reflects the sequela of chronic microvascular ischemia. 3. Remote lacunar infarct of the right thalamus. 4. Bilateral mastoid effusions. No obstructing nasopharyngeal lesion is present. Electronically Signed   By: Marin Roberts M.D.   On: 02/23/2023 17:20   CT Head Wo Contrast  Result Date: 02/22/2023 CLINICAL DATA:  Altered mental status EXAM: CT HEAD WITHOUT CONTRAST TECHNIQUE: Contiguous axial images were obtained from the base of the skull through the vertex without intravenous contrast. RADIATION DOSE REDUCTION: This exam was performed according to the departmental dose-optimization program which includes automated exposure control, adjustment of the mA and/or kV according to patient size and/or use of iterative reconstruction technique. COMPARISON:  CT 12/25/2022, 02/21/2023 FINDINGS: Brain: No acute territorial infarction, hemorrhage or intracranial mass. Atrophy and advanced chronic small vessel ischemic changes of the white matter. More focal right posterior frontal hypodensity, series 2, image 21, also  chronic. Age indeterminate lacunar infarct in the head of left caudate, suspect chronic. Ventricles are stable in size. Vascular: No  hyperdense vessels. Vertebral and carotid vascular calcification Skull: Normal. Negative for fracture or focal lesion. Sinuses/Orbits: No acute finding. Other: None IMPRESSION: 1. No CT evidence for acute intracranial abnormality. 2. Atrophy and advanced chronic small vessel ischemic changes of the white matter. Age indeterminate lacunar infarct within the left caudate head. Electronically Signed   By: Jasmine Pang M.D.   On: 02/22/2023 18:01   CT HEAD WO CONTRAST  Result Date: 02/21/2023 CLINICAL DATA:  Altered mental status, stroke suspected EXAM: CT HEAD WITHOUT CONTRAST TECHNIQUE: Contiguous axial images were obtained from the base of the skull through the vertex without intravenous contrast. RADIATION DOSE REDUCTION: This exam was performed according to the departmental dose-optimization program which includes automated exposure control, adjustment of the mA and/or kV according to patient size and/or use of iterative reconstruction technique. COMPARISON:  12/25/2022 FINDINGS: Brain: No evidence of acute infarction, hemorrhage, hydrocephalus, extra-axial collection or mass lesion/mass effect. Periventricular and deep white matter hypodensity. Vascular: No hyperdense vessel or unexpected calcification. Skull: Normal. Negative for fracture or focal lesion. Sinuses/Orbits: No acute finding. Other: None. IMPRESSION: No acute intracranial pathology. Small-vessel white matter disease. MRI may be used to more sensitively evaluate for acute diffusion restricting infarction if clinically suspected. Electronically Signed   By: Jearld Lesch M.D.   On: 02/21/2023 15:36     TODAY-DAY OF DISCHARGE:  Subjective:   [name] today has no headache,no chest abdominal pain,no new weakness tingling or numbness, feels much better wants to go home today.   Objective:   Blood pressure 129/65, pulse 79, temperature 98.4 F (36.9 C), temperature source Oral, resp. rate 18, height 5\' 4"  (1.626 m), weight 68 kg, SpO2  93%.  Intake/Output Summary (Last 24 hours) at 03/16/2023 0914 Last data filed at 03/16/2023 0600 Gross per 24 hour  Intake 720 ml  Output 1600 ml  Net -880 ml   Filed Weights   02/22/23 1323  Weight: 68 kg    Exam: Awake Alert, Oriented *3, No new F.N deficits, Normal affect Cross.AT,PERRAL Supple Neck,No JVD, No cervical lymphadenopathy appriciated.  Symmetrical Chest wall movement, Good air movement bilaterally, CTAB RRR,No Gallops,Rubs or new Murmurs, No Parasternal Heave +ve B.Sounds, Abd Soft, Non tender, No organomegaly appriciated, No rebound -guarding or rigidity. No Cyanosis, Clubbing or edema, No new Rash or bruise   PERTINENT RADIOLOGIC STUDIES: No results found.   PERTINENT LAB RESULTS: CBC: No results for input(s): "WBC", "HGB", "HCT", "PLT" in the last 72 hours. CMET CMP     Component Value Date/Time   NA 142 03/11/2023 0323   K 3.7 03/11/2023 0323   CL 105 03/11/2023 0323   CO2 26 03/11/2023 0323   GLUCOSE 98 03/11/2023 0323   BUN 19 03/11/2023 0323   CREATININE 1.21 03/11/2023 0323   CALCIUM 10.0 03/11/2023 0323   PROT 5.6 (L) 03/05/2023 0338   ALBUMIN 2.3 (L) 03/05/2023 0338   AST 19 03/05/2023 0338   ALT 14 03/05/2023 0338   ALKPHOS 30 (L) 03/05/2023 0338   BILITOT 0.6 03/05/2023 0338   GFRNONAA >60 03/11/2023 0323    GFR Estimated Creatinine Clearance: 42.1 mL/min (by C-G formula based on SCr of 1.21 mg/dL). No results for input(s): "LIPASE", "AMYLASE" in the last 72 hours. No results for input(s): "CKTOTAL", "CKMB", "CKMBINDEX", "TROPONINI" in the last 72 hours. Invalid input(s): "POCBNP" No results for input(s): "DDIMER" in the last  72 hours. No results for input(s): "HGBA1C" in the last 72 hours. No results for input(s): "CHOL", "HDL", "LDLCALC", "TRIG", "CHOLHDL", "LDLDIRECT" in the last 72 hours. No results for input(s): "TSH", "T4TOTAL", "T3FREE", "THYROIDAB" in the last 72 hours.  Invalid input(s): "FREET3" No results for  input(s): "VITAMINB12", "FOLATE", "FERRITIN", "TIBC", "IRON", "RETICCTPCT" in the last 72 hours. Coags: No results for input(s): "INR" in the last 72 hours.  Invalid input(s): "PT" Microbiology: No results found for this or any previous visit (from the past 240 hour(s)).  FURTHER DISCHARGE INSTRUCTIONS:  Get Medicines reviewed and adjusted: Please take all your medications with you for your next visit with your Primary MD  Laboratory/radiological data: Please request your Primary MD to go over all hospital tests and procedure/radiological results at the follow up, please ask your Primary MD to get all Hospital records sent to his/her office.  In some cases, they will be blood work, cultures and biopsy results pending at the time of your discharge. Please request that your primary care M.D. goes through all the records of your hospital data and follows up on these results.  Also Note the following: If you experience worsening of your admission symptoms, develop shortness of breath, life threatening emergency, suicidal or homicidal thoughts you must seek medical attention immediately by calling 911 or calling your MD immediately  if symptoms less severe.  You must read complete instructions/literature along with all the possible adverse reactions/side effects for all the Medicines you take and that have been prescribed to you. Take any new Medicines after you have completely understood and accpet all the possible adverse reactions/side effects.   Do not drive when taking Pain medications or sleeping medications (Benzodaizepines)  Do not take more than prescribed Pain, Sleep and Anxiety Medications. It is not advisable to combine anxiety,sleep and pain medications without talking with your primary care practitioner  Special Instructions: If you have smoked or chewed Tobacco  in the last 2 yrs please stop smoking, stop any regular Alcohol  and or any Recreational drug use.  Wear Seat belts  while driving.  Please note: You were cared for by a hospitalist during your hospital stay. Once you are discharged, your primary care physician will handle any further medical issues. Please note that NO REFILLS for any discharge medications will be authorized once you are discharged, as it is imperative that you return to your primary care physician (or establish a relationship with a primary care physician if you do not have one) for your post hospital discharge needs so that they can reassess your need for medications and monitor your lab values.  Total Time spent coordinating discharge including counseling, education and face to face time equals greater than 30 minutes.  SignedJeoffrey Massed 03/16/2023 9:14 AM

## 2023-03-16 NOTE — Plan of Care (Signed)

## 2023-03-28 LAB — MISC LABCORP TEST (SEND OUT): Labcorp test code: 9985

## 2023-05-19 IMAGING — DX DG CHEST 2V
2 series · 2 of 2 positions shown · non-contrast
Comparison: 01/22/2008.

CLINICAL DATA: Shortness of breath and fatigue.

EXAM:
CHEST - 2 VIEW

[chest pa]
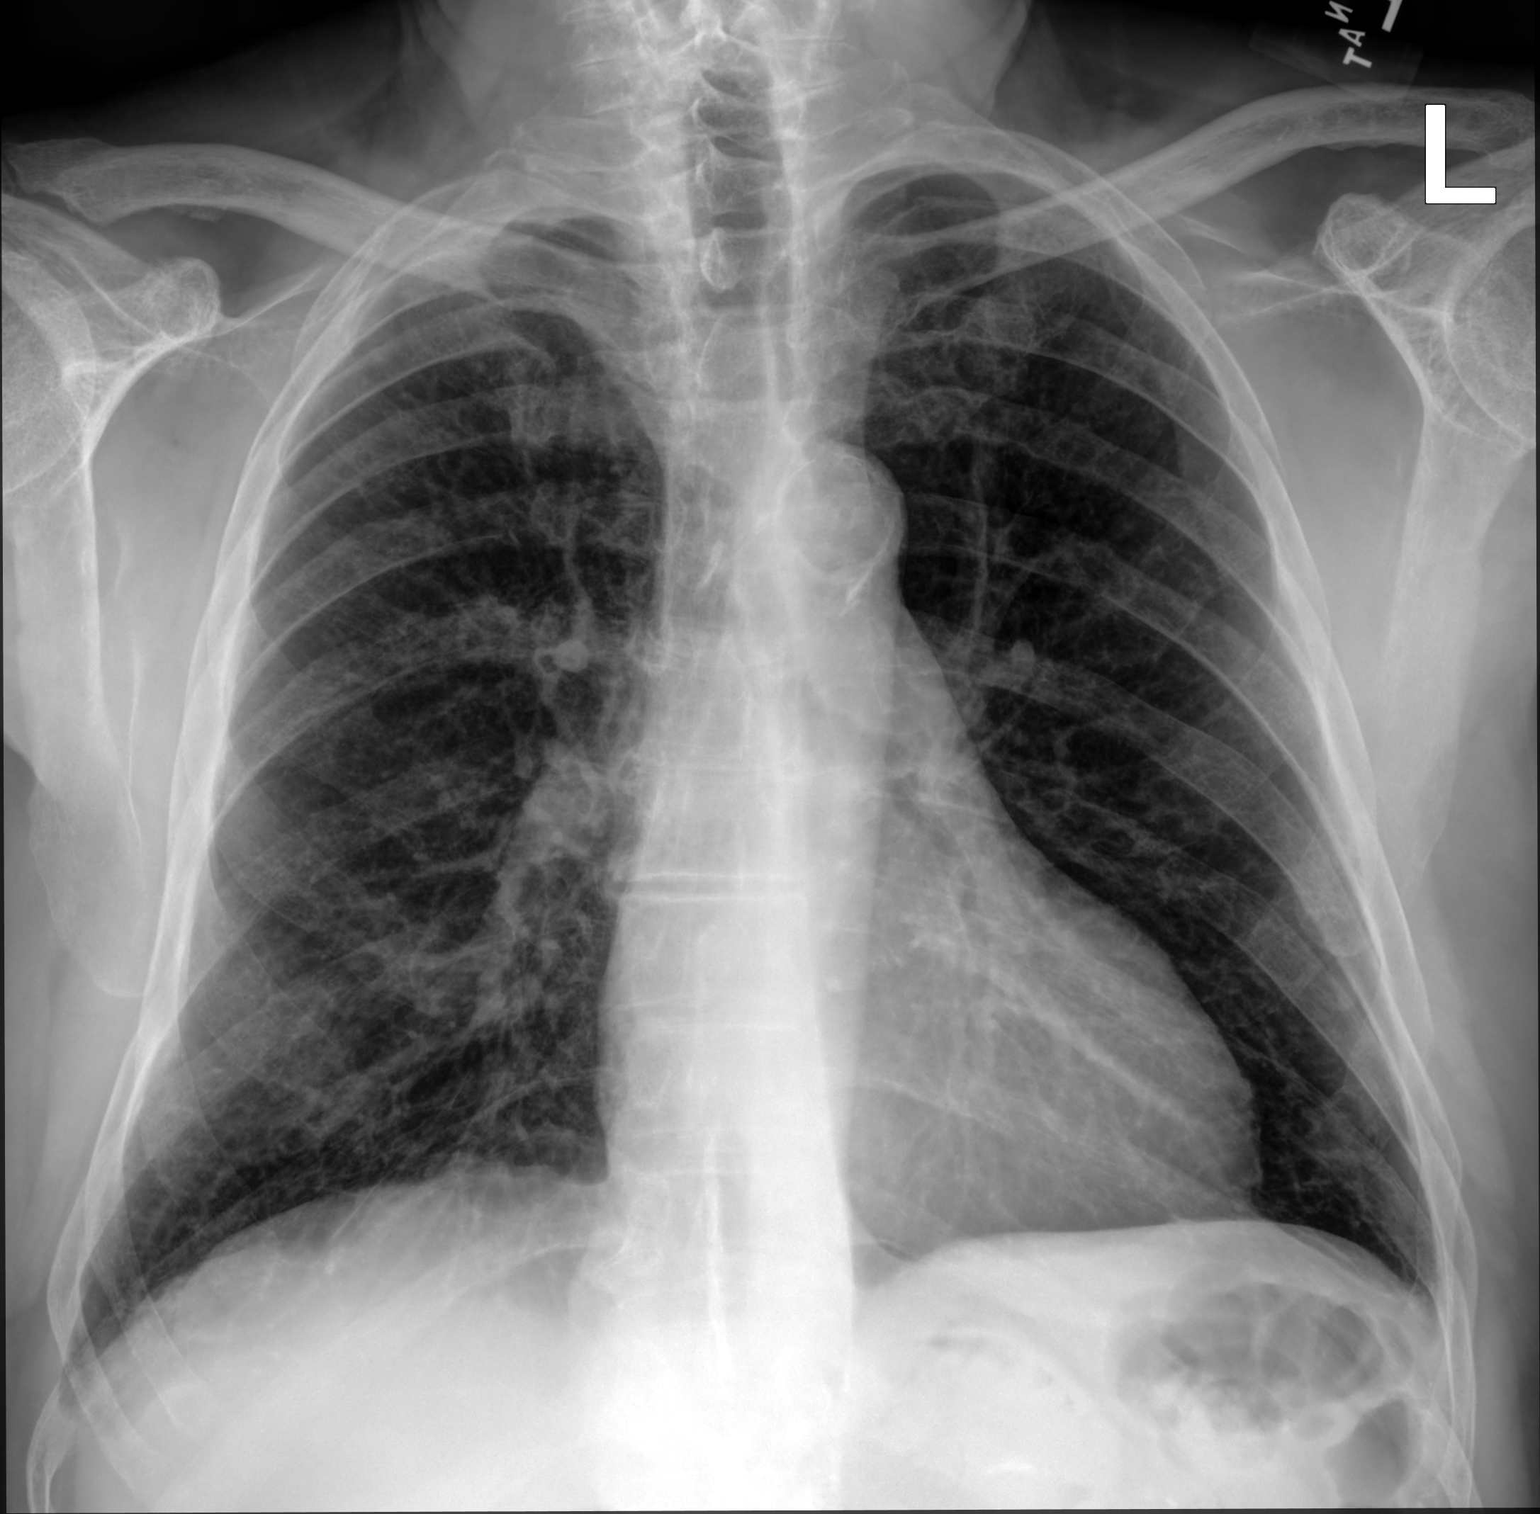

[chest lat]
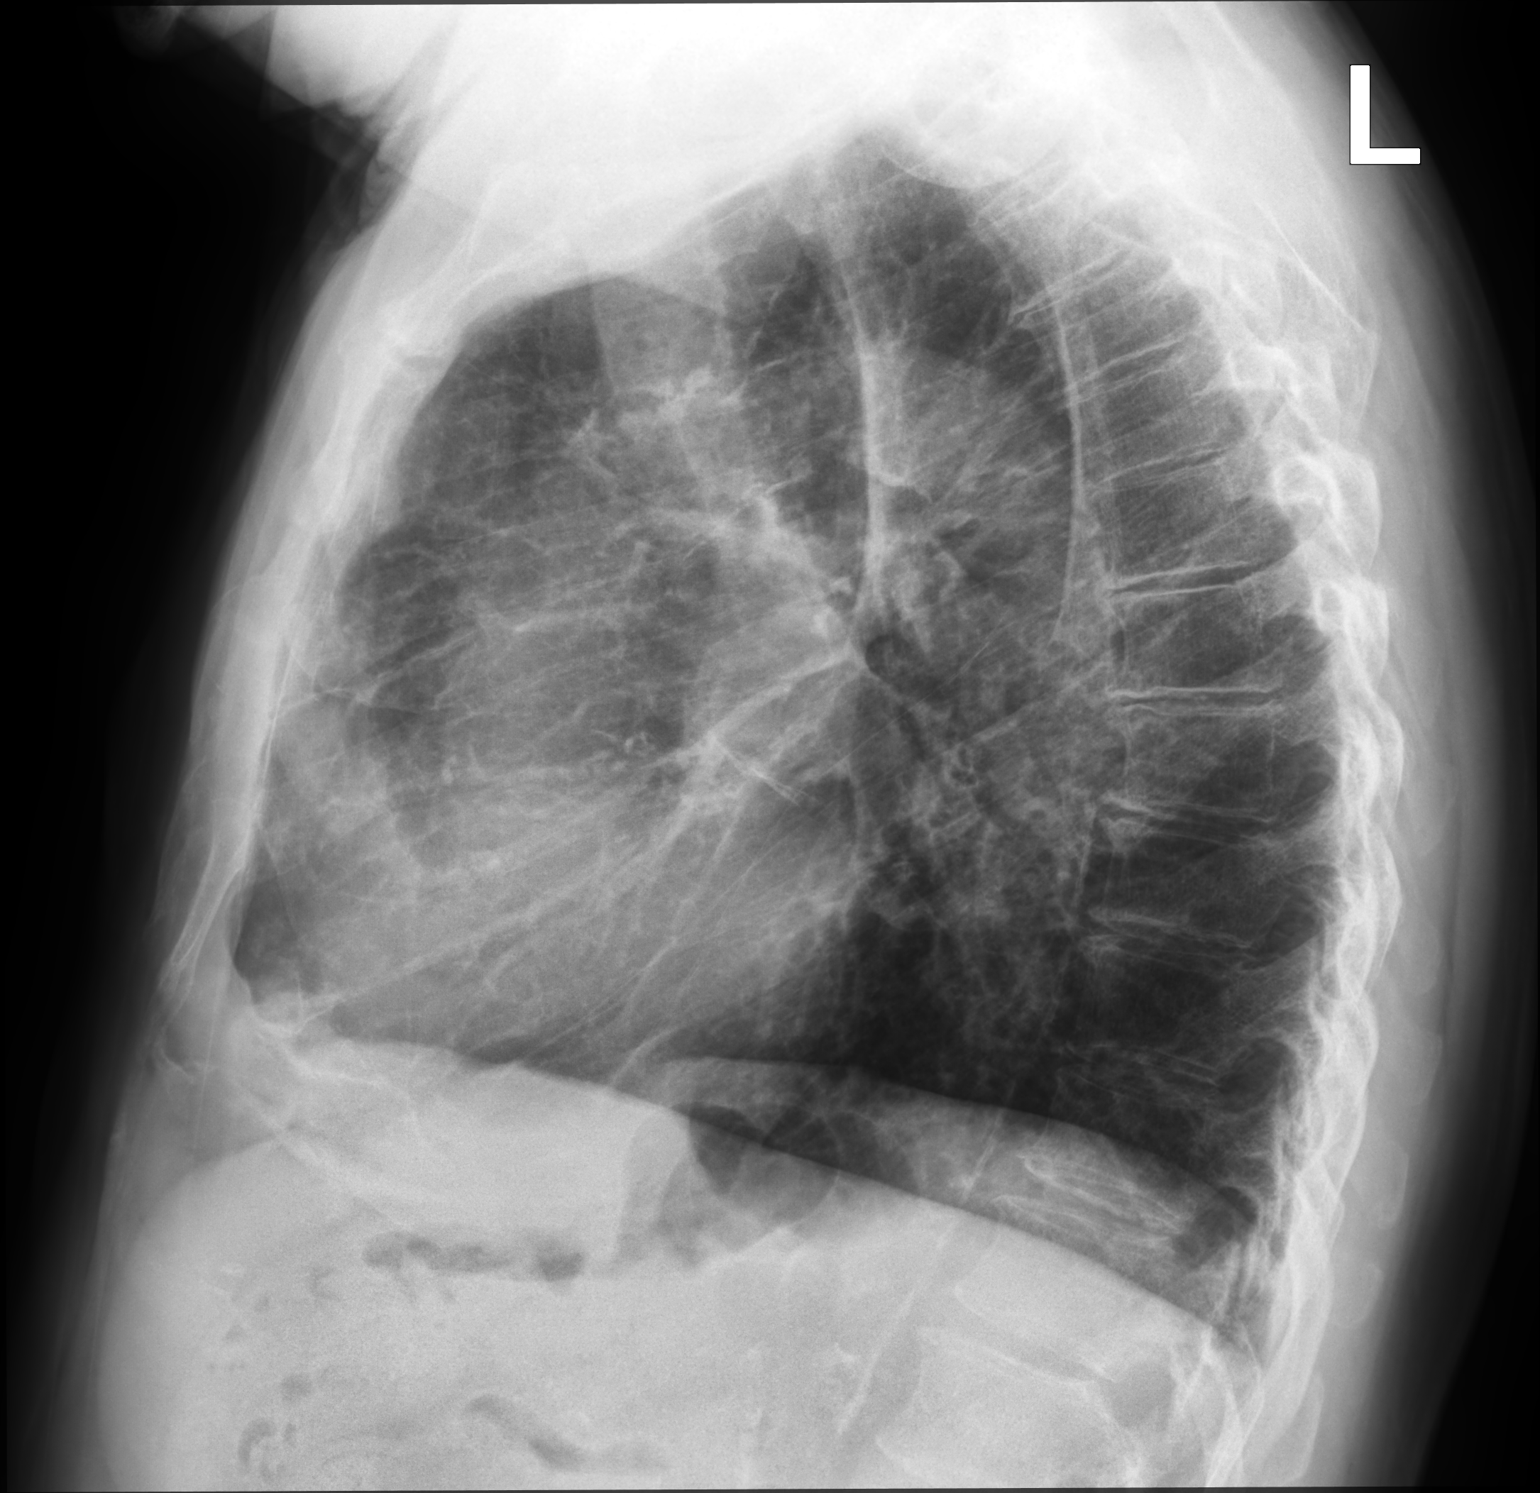

[2 of 2 positions shown; findings below may reference images not displayed]

FINDINGS: The heart size and mediastinal contours are within normal limits.
Calcific atherosclerosis of the aorta. Chronic prominence of the
interstitial markings. No consolidation. No visible pleural
effusions or pneumothorax. No acute osseous abnormality. Osteopenia.
Polyarticular degenerative change.
IMPRESSION: Chronic lung changes without evidence of acute cardiopulmonary
disease.

## 2023-06-07 ENCOUNTER — Emergency Department (HOSPITAL_COMMUNITY)
Admission: EM | Admit: 2023-06-07 | Discharge: 2023-06-08 | Disposition: A | Discharge status: Home / Self Care | Payer: 59 | Attending: Emergency Medicine | Admitting: Emergency Medicine

## 2023-06-07 ENCOUNTER — Encounter (HOSPITAL_COMMUNITY): Payer: Self-pay

## 2023-06-07 ENCOUNTER — Other Ambulatory Visit: Payer: Self-pay

## 2023-06-07 DIAGNOSIS — R451 Restlessness and agitation: Secondary | ICD-10-CM | POA: Insufficient documentation

## 2023-06-07 DIAGNOSIS — I10 Essential (primary) hypertension: Secondary | ICD-10-CM | POA: Diagnosis not present

## 2023-06-07 DIAGNOSIS — F0390 Unspecified dementia without behavioral disturbance: Secondary | ICD-10-CM | POA: Diagnosis not present

## 2023-06-07 DIAGNOSIS — F03911 Unspecified dementia, unspecified severity, with agitation: Secondary | ICD-10-CM

## 2023-06-07 DIAGNOSIS — Z79899 Other long term (current) drug therapy: Secondary | ICD-10-CM | POA: Diagnosis not present

## 2023-06-07 DIAGNOSIS — R262 Difficulty in walking, not elsewhere classified: Secondary | ICD-10-CM | POA: Diagnosis present

## 2023-06-07 DIAGNOSIS — Z7902 Long term (current) use of antithrombotics/antiplatelets: Secondary | ICD-10-CM | POA: Diagnosis not present

## 2023-06-07 LAB — CBC WITH DIFFERENTIAL/PLATELET
Abs Immature Granulocytes: 0.02 10*3/uL (ref 0.00–0.07)
Basophils Absolute: 0.1 10*3/uL (ref 0.0–0.1)
Basophils Relative: 1 %
Eosinophils Absolute: 0.2 10*3/uL (ref 0.0–0.5)
Eosinophils Relative: 3 %
HCT: 41.9 % (ref 39.0–52.0)
Hemoglobin: 13.4 g/dL (ref 13.0–17.0)
Immature Granulocytes: 0 %
Lymphocytes Relative: 27 %
Lymphs Abs: 2.2 10*3/uL (ref 0.7–4.0)
MCH: 28 pg (ref 26.0–34.0)
MCHC: 32 g/dL (ref 30.0–36.0)
MCV: 87.5 fL (ref 80.0–100.0)
Monocytes Absolute: 0.8 10*3/uL (ref 0.1–1.0)
Monocytes Relative: 10 %
Neutro Abs: 4.9 10*3/uL (ref 1.7–7.7)
Neutrophils Relative %: 59 %
Platelets: 261 10*3/uL (ref 150–400)
RBC: 4.79 MIL/uL (ref 4.22–5.81)
RDW: 15.7 % — ABNORMAL HIGH (ref 11.5–15.5)
WBC: 8.3 10*3/uL (ref 4.0–10.5)
nRBC: 0 % (ref 0.0–0.2)

## 2023-06-07 LAB — COMPREHENSIVE METABOLIC PANEL
ALT: 19 U/L (ref 0–44)
AST: 24 U/L (ref 15–41)
Albumin: 3.6 g/dL (ref 3.5–5.0)
Alkaline Phosphatase: 31 U/L — ABNORMAL LOW (ref 38–126)
Anion gap: 9 (ref 5–15)
BUN: 14 mg/dL (ref 8–23)
CO2: 27 mmol/L (ref 22–32)
Calcium: 11.6 mg/dL — ABNORMAL HIGH (ref 8.9–10.3)
Chloride: 103 mmol/L (ref 98–111)
Creatinine, Ser: 1.21 mg/dL (ref 0.61–1.24)
GFR, Estimated: >60 mL/min (ref >60)
Glucose, Bld: 104 mg/dL — ABNORMAL HIGH (ref 70–99)
Potassium: 3.6 mmol/L (ref 3.5–5.1)
Sodium: 139 mmol/L (ref 135–145)
Total Bilirubin: 0.5 mg/dL (ref 0.0–1.2)
Total Protein: 7.4 g/dL (ref 6.5–8.1)

## 2023-06-07 LAB — URINALYSIS, W/ REFLEX TO CULTURE (INFECTION SUSPECTED)
Bacteria, UA: NONE SEEN
Bilirubin Urine: NEGATIVE
Glucose, UA: NEGATIVE mg/dL
Hgb urine dipstick: NEGATIVE
Ketones, ur: NEGATIVE mg/dL
Leukocytes,Ua: NEGATIVE
Nitrite: NEGATIVE
Protein, ur: 30 mg/dL — AB
Specific Gravity, Urine: 1.012 (ref 1.005–1.030)
pH: 6 (ref 5.0–8.0)

## 2023-06-07 MED ORDER — CLOPIDOGREL BISULFATE 75 MG PO TABS
75.0000 mg | ORAL_TABLET | Freq: Every day | ORAL | Status: DC
  Administered 2023-06-08: 75 mg via ORAL
  Filled 2023-06-07 (×2): qty 1

## 2023-06-07 MED ORDER — ISOSORBIDE MONONITRATE ER 30 MG PO TB24
30.0000 mg | ORAL_TABLET | Freq: Every day | ORAL | Status: DC
  Administered 2023-06-08: 30 mg via ORAL
  Filled 2023-06-07 (×2): qty 1

## 2023-06-07 MED ORDER — PANTOPRAZOLE SODIUM 40 MG PO TBEC
40.0000 mg | DELAYED_RELEASE_TABLET | Freq: Every day | ORAL | Status: DC
Start: 2023-06-07 — End: 2023-06-08
  Administered 2023-06-08: 40 mg via ORAL
  Filled 2023-06-07 (×2): qty 1

## 2023-06-07 MED ORDER — FINASTERIDE 5 MG PO TABS
5.0000 mg | ORAL_TABLET | Freq: Every day | ORAL | Status: DC
  Administered 2023-06-08: 5 mg via ORAL
  Filled 2023-06-07 (×2): qty 1

## 2023-06-07 MED ORDER — QUETIAPINE FUMARATE 25 MG PO TABS
50.0000 mg | ORAL_TABLET | Freq: Once | ORAL | Status: AC
  Administered 2023-06-07: 50 mg via ORAL
  Filled 2023-06-07: qty 2

## 2023-06-07 MED ORDER — ATORVASTATIN CALCIUM 40 MG PO TABS
40.0000 mg | ORAL_TABLET | Freq: Every day | ORAL | Status: DC
  Administered 2023-06-08: 40 mg via ORAL
  Filled 2023-06-07 (×2): qty 1

## 2023-06-07 MED ORDER — FOLIC ACID 1 MG PO TABS
1.0000 mg | ORAL_TABLET | Freq: Every day | ORAL | Status: DC
  Administered 2023-06-08: 1 mg via ORAL
  Filled 2023-06-07 (×2): qty 1

## 2023-06-07 MED ORDER — CARVEDILOL 3.125 MG PO TABS
3.1250 mg | ORAL_TABLET | Freq: Two times a day (BID) | ORAL | Status: DC
  Administered 2023-06-08 (×2): 3.125 mg via ORAL
  Filled 2023-06-07 (×2): qty 1

## 2023-06-07 MED ORDER — HYDRALAZINE HCL 25 MG PO TABS
50.0000 mg | ORAL_TABLET | Freq: Three times a day (TID) | ORAL | Status: DC
  Administered 2023-06-08: 50 mg via ORAL
  Filled 2023-06-07 (×3): qty 2

## 2023-06-07 MED ORDER — TAMSULOSIN HCL 0.4 MG PO CAPS
0.4000 mg | ORAL_CAPSULE | Freq: Every day | ORAL | Status: DC
  Administered 2023-06-08: 0.4 mg via ORAL
  Filled 2023-06-07 (×2): qty 1

## 2023-06-07 NOTE — ED Triage Notes (Signed)
Pt to ED via [facility] (pt not in custody). Emergency Services was contacted by pt's neighbor after the neighbor found patient outside tonight and patient's wife would not unlock door. Per neighbor pt has dementia and patient's wife has mental health problems and the neighbor is unsure of how long patient has been outside.  Patient is alert to self, disoriented to time, place or situation. NAD noted, denies pain at this time.  Per deputy: Enos Fling, [phone] Patient's SW, [place], [phone] Daughter, Kenney Houseman, [phone] (patient does not say if he has a daughter or not)

## 2023-06-07 NOTE — ED Provider Notes (Signed)
Patient signed out by Dr. Hyacinth Meeker.  78 year old with memory issues lives with wife who is not able to care for him.  Patient is seen by social work and they feel he would be better served by going into boarder status and getting physical therapy involved.  May ultimately need placement.  Have put in to get his meds reconciled so can order his home medications. Physical Exam  BP (!) 174/96 (BP Location: Right Arm)   Pulse 60   Temp 98.4 F (36.9 C) (Oral)   Resp 20   Ht 5\' 4"  (1.626 m)   Wt 70 kg   SpO2 94%   BMI 26.49 kg/m   Physical Exam  Procedures  Procedures  ED Course / MDM    Medical Decision Making Amount and/or Complexity of Data Reviewed Labs: ordered.  Risk Prescription drug management.   Patient will be placed in boarder status until his safety and housing situation can be better secured.  Have placed a physical therapy consult.  Social work following along.       Terrilee Files, MD 06/08/23 1027

## 2023-06-07 NOTE — Progress Notes (Addendum)
11am: CSW received message from charge RN Asher Muir stating patient is unsafe returning home as his wife is demented.   CSW has attempted to reach patient's wife several times without success - no voicemail option is available.  Per PING - patient recently active with [facility] for [facility] in December. CSW requested MD place new HH orders - RN CM to contact [facility] to determine if they can accept patient back.  9:40am: CSW was notified that patient's neighbor [name] was here to pick him up.  9am: CSW spoke with patient's neighbor [name] who states she will come and pick up patient to transport him home.  CSW notified MD of information.  Edwin Dada, MSW, LCSW Transitions of Care  Clinical Social Worker II [phone]

## 2023-06-07 NOTE — Discharge Instructions (Signed)
Thank you for allowing [place] to treat you in the emergency department today.  After reviewing your examination and potential testing that was done it appears that you are safe to go home.  I would like for you to follow-up with your doctor within the next several days, have them obtain your records and follow-up with them to review all potential tests and results from your visit.  If you should develop severe or worsening symptoms return to the emergency department immediately

## 2023-06-07 NOTE — ED Notes (Signed)
Pt resting in stretcher with neighbor at bedside

## 2023-06-07 NOTE — Care Management (Addendum)
Transition of Care [facility]) - Emergency Department Mini Assessment   Patient Details  Name: Dennis [name] MRN: [id number] Date of Birth: 12-26-1945  Transition of Care [facility]) CM/SW Contact:    Lockie Pares, RN Phone Number: 06/07/2023, 11:57 AM   Clinical Narrative:  Consulted for Home health. Patients wife has dementia, he is wandering in the ED, sometimes difficult to redirect. CSW messaged MD for [facility] orders to assist pain including PT OT and social work. Neighbor is listed as contact as well. Messaged [facility] artavia and Jason. Awaiting accepta nce, he previously had [facility] , DC from [facility] services in 04/2023 1300 Heather accepted for PT OT social work Home health from [facility] Will need HH [id number] orders. Currently unable to get wife on the phone. 1355 Patients wife apparently called the main ED number and is on her way in. Had tried to call the numbers listed for patient and SO, but none worked. The number of SO is [phone] that called in to ED, tried to confirm with a call to that number, no voicemail can be left. Messaged provider regarding face to face orders that need to be inputted 1500 Neighbor at bedside  Spoke to her regarding MR Morin. He is in the stretcher with his clothes on, observed to be restless. She stated he and his "wife" are not married, She has a daughter and son, but he has no children. His So has dementia as well Alda Lea from DSS is working on finding Assisted living. Randel Pigg at [phone] and left a confidential voice message. Discussion via securechat, Have secured HH, but deemed unsafe dispo at this time will board for now Dr Charm Barges will order home meds.  1650 Neighbor left wants updates so she can update SO  ED Mini Assessment:    Barriers to Discharge: ED Unsafe disposition  Barrier interventions: Contacted home health.     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 ([address]), [place] - [address] [place] ([place]) [place] [address] Phone: [phone] Fax: [phone]

## 2023-06-07 NOTE — ED Provider Notes (Signed)
[place] EMERGENCY DEPARTMENT AT [facility] Provider Note   CSN: [id number] Arrival date & time: 06/07/23  6578     History  Chief Complaint  Patient presents with   Altered Mental Status    Dennis [name] is a 78 y.o. male.   Altered Mental Status  This patient is a 78 year old male, history of dementia, hyperlipidemia, hypertension, rheumatoid arthritis, sleep apnea, history of altered mental status admitted in October 2020 for about 3 months ago, the patient presented initially quite confused, he was actually under IVC, the end of that admission showed that the patient likely had undiagnosed dementia, the outpatient MD and neurology thought that the patient significant other did have capacity to take care of this patient, APS was involved and said it was okay for the patient to be discharged home into the care of the significant other.  Evidently this patient was found to be outside tonight, the patient's wife would not unlock the door, no other complaints, patient is alert to self disoriented to time or place, brought by [facility] Medications Prior to Admission medications   Medication Sig Start Date End Date Taking? Authorizing Provider  atorvastatin (LIPITOR) 40 MG tablet Take 1 tablet (40 mg total) by mouth daily. 03/16/23   Ghimire, Werner Lean, MD  carvedilol (COREG) 3.125 MG tablet Take 1 tablet (3.125 mg total) by mouth 2 (two) times daily with a meal. 03/16/23   Ghimire, Werner Lean, MD  clopidogrel (PLAVIX) 75 MG tablet Take 1 tablet (75 mg total) by mouth daily. 03/16/23   Ghimire, Werner Lean, MD  finasteride (PROSCAR) 5 MG tablet Take 1 tablet (5 mg total) by mouth daily. 03/16/23   Ghimire, Werner Lean, MD  folic acid (FOLVITE) 1 MG tablet Take 1 tablet (1 mg total) by mouth daily. 03/16/23   Ghimire, Werner Lean, MD  hydrALAZINE (APRESOLINE) 50 MG tablet Take 1 tablet (50 mg total) by mouth every 8 (eight) hours. 03/16/23   Ghimire, Werner Lean, MD  isosorbide mononitrate (IMDUR) 30 MG 24 hr tablet Take 1 tablet (30 mg total) by mouth daily. 03/16/23   Ghimire, Werner Lean, MD  methotrexate 50 MG/2ML injection Inject 30 mg/m2 into the skin once a week. 12/09/22   [provider]  omeprazole (PRILOSEC) 20 MG capsule Take 1 capsule (20 mg total) by mouth daily. 03/16/23   Ghimire, Werner Lean, MD  tamsulosin (FLOMAX) 0.4 MG CAPS capsule Take 1 capsule (0.4 mg total) by mouth daily. 03/16/23   Ghimire, Werner Lean, MD  triamcinolone cream (KENALOG) 0.1 % Apply 1 application topically 2 (two) times daily. 11/24/20   Wieters, Hallie C, PA-C      Allergies    Codeine and Shellfish allergy    Review of Systems   Review of Systems  All other systems reviewed and are negative.   Physical Exam Updated Vital Signs BP (!) 192/117 (BP Location: Right Arm)   Pulse 99   Temp 97.7 F (36.5 C)   Resp 18   Ht 1.626 m (5\' 4" )   Wt 70 kg   SpO2 94%   BMI 26.49 kg/m  Physical Exam Vitals and nursing note reviewed.  Constitutional:      General: He is not in acute distress.    Appearance: He is well-developed.  HENT:     Head: Normocephalic and atraumatic.     Mouth/Throat:     Pharynx: No oropharyngeal exudate.  Eyes:  General: No scleral icterus.       Right eye: No discharge.        Left eye: No discharge.     Conjunctiva/sclera: Conjunctivae normal.     Pupils: Pupils are equal, round, and reactive to light.  Neck:     Thyroid: No thyromegaly.     Vascular: No JVD.  Cardiovascular:     Rate and Rhythm: Normal rate and regular rhythm.     Heart sounds: Normal heart sounds. No murmur heard.    No friction rub. No gallop.  Pulmonary:     Effort: Pulmonary effort is normal. No respiratory distress.     Breath sounds: Normal breath sounds. No wheezing or rales.  Abdominal:     General: Bowel sounds are normal. There is no distension.     Palpations: Abdomen is soft. There is no mass.     Tenderness: There is no  abdominal tenderness.  Musculoskeletal:        General: No tenderness. Normal range of motion.     Cervical back: Normal range of motion and neck supple.     Right lower leg: No edema.     Left lower leg: No edema.  Lymphadenopathy:     Cervical: No cervical adenopathy.  Skin:    General: Skin is warm and dry.     Findings: No erythema or rash.  Neurological:     Mental Status: He is alert.     Coordination: Coordination normal.     Comments: The patient is ambulatory, he is walking around the emergency department looking at things, he is in no distress, he is able to speak in full sentences but has decreased cognition, he has no focal weakness or numbness, cranial nerves III through XII are normal  Psychiatric:        Behavior: Behavior normal.     ED Results / Procedures / Treatments   Labs (all labs ordered are listed, but only abnormal results are displayed) Labs Reviewed  COMPREHENSIVE METABOLIC PANEL - Abnormal; Notable for the following components:      Result Value   Glucose, Bld 104 (*)    Calcium 11.6 (*)    Alkaline Phosphatase 31 (*)    All other components within normal limits  CBC WITH DIFFERENTIAL/PLATELET - Abnormal; Notable for the following components:   RDW 15.7 (*)    All other components within normal limits  URINALYSIS, W/ REFLEX TO CULTURE (INFECTION SUSPECTED) - Abnormal; Notable for the following components:   Protein, ur 30 (*)    All other components within normal limits    EKG EKG Interpretation Date/Time:  Saturday June 07 2023 06:45:55 EST Ventricular Rate:  90 PR Interval:    QRS Duration:  86 QT Interval:  360 QTC Calculation: 440 R Axis:   51  Text Interpretation: Normal sinus rhythm Premature atrial complexes ST & T wave abnormality, consider inferolateral ischemia Abnormal ECG When compared with ECG of 24-Feb-2023 09:08, PREVIOUS ECG IS PRESENT Confirmed by Eber Hong ([id number]) on 06/07/2023 7:37:13 AM  Radiology No results  found.  Procedures Procedures    Medications Ordered in ED Medications - No data to display  ED Course/ Medical Decision Making/ A&P                                 Medical Decision Making Amount and/or Complexity of Data Reviewed Labs: ordered.   This patient does not  appear acutely ill, vital signs do reflect some hypertension but he is not tachycardic or febrile, he appears to be in no distress.  I have attempted to contact the patient's significant other as well as his daughter neither of who is answering phone calls.  Labs: I personally viewed and interpreted the labs which show a normal metabolic panel, normal liver function, CBC without leukocytosis or anemia and a urinalysis without any signs of infection  EKG without acute findings, cardiac monitor in normal sinus rhythm  ED course: Patient has been ambulatory around the department, redirectable, family member or friend is come to pick him up, it is his neighbor.  This patient is stable for discharge at this time        Final Clinical Impression(s) / ED Diagnoses Final diagnoses:  Agitation due to dementia [facility])    Rx / DC Orders ED Discharge Orders     None         Eber Hong, MD 06/07/23 1022

## 2023-06-07 NOTE — ED Notes (Signed)
Pt is restless and walking around in hallway and taking gown off.

## 2023-06-07 NOTE — ED Notes (Signed)
Patient assisted to bathroom (two-person assist), changed into new brief/disposable scrub pants, and non-slip socks placed on patient. Patient clothes/belongings given to patient visitor in patient belongings bag.

## 2023-06-08 NOTE — Care Management (Addendum)
Spoke at the bedside to [name] and SO licia. Explained the plan of [facility] home health, referral to a place for mom, and follow up with DSS as before. Both agreed to this plan. Meesage sent to [facility] for referral to  a place for mom. Message with team and provider for home health orders. He has already been accepted by [facility]. 1600 [address] to face orders placed patient will be dispo to home with SO

## 2023-06-08 NOTE — ED Notes (Signed)
Per staffing, no sitter available.

## 2023-06-08 NOTE — ED Provider Notes (Signed)
Emergency Medicine Observation Re-evaluation Note  Dennis [name] is a 78 y.o. male, seen on rounds today.  Pt initially presented to the ED for complaints of Altered Mental Status Currently, the patient is awaiting placement for memory care. Resting quietly no distress Physical Exam  BP (!) 157/98 (BP Location: Left Arm)   Pulse 87   Temp 97.6 F (36.4 C) (Oral)   Resp 16   Ht 5\' 4"  (1.626 m)   Wt 70 kg   SpO2 93%   BMI 26.49 kg/m  Physical Exam General: Resting quietly. Cardiac: Regular Lungs: Clear to auscultation Psych: Currently resting and calm  ED Course / MDM  EKG:EKG Interpretation Date/Time:  Saturday June 07 2023 06:45:55 EST Ventricular Rate:  90 PR Interval:    QRS Duration:  86 QT Interval:  360 QTC Calculation: 440 R Axis:   51  Text Interpretation: Normal sinus rhythm Premature atrial complexes ST & T wave abnormality, consider inferolateral ischemia Abnormal ECG When compared with ECG of 24-Feb-2023 09:08, PREVIOUS ECG IS PRESENT Confirmed by Eber Hong ([id number]) on 06/07/2023 7:37:13 AM  I have reviewed the labs performed to date as well as medications administered while in observation.  Recent changes in the last 24 hours include none.  Plan  Current plan is for memory care placement.    Arby Barrette, MD 06/08/23 [phone]

## 2023-06-08 NOTE — Evaluation (Signed)
Physical Therapy Evaluation & Discharge Patient Details Name: Dennis [name] MRN: [id number] DOB: 28-Jun-1945 Today's Date: 06/08/2023  History of Present Illness  78 y.o. male admitted 06/07/23 with AMS, found locked outside of his house. Of note, admission 02/2023 with similar concerns. PMH includes dementia, HLD, HTN, RA, OSA.   Clinical Impression  Patient evaluated by Physical Therapy with no further acute PT needs identified. PTA, pt lives with wife, pt and wife with h/o dementia. Today, pt A&O to self only, limited short-term memory despite attempts to reorient; pt very HOH but following majority of gestural commands appropriately. Pt ambulating and performing self-care tasks with intermittent CGA for balance and guidance due to cognitive impairment. No acute PT or follow-up PT needs identified. Recommend ALF/memory care/LTC for safety given dementia. PT is signing off. Thank you for this referral.      If plan is discharge home, recommend the following: A little help with bathing/dressing/bathroom;Assistance with cooking/housework;Direct supervision/assist for medications management;Direct supervision/assist for financial management;Assist for transportation;Supervision due to cognitive status   Can travel by private vehicle   Yes    Equipment Recommendations None recommended by PT  Recommendations for Other Services       Functional Status Assessment       Precautions / Restrictions Precautions Precautions: Fall Restrictions Weight Bearing Restrictions Per Provider Order: No      Mobility  Bed Mobility Overal bed mobility: Modified Independent             General bed mobility comments: 2x supine<>sit with HOB elevated    Transfers Overall transfer level: Needs assistance Equipment used: None Transfers: Sit to/from Stand Sit to Stand: Supervision           General transfer comment: supervision for safety due to cognitive impairment     Ambulation/Gait Ambulation/Gait assistance: Contact guard assist Gait Distance (Feet): 60 Feet Assistive device: 1 person hand held assist, None Gait Pattern/deviations: Step-through pattern, Decreased stride length, Trunk flexed Gait velocity: Decreased     General Gait Details: initial HHA for stability, progressing to no UE support; standby assist for balance and guidance due to cognitive impairment  Stairs            Wheelchair Mobility     Tilt Bed    Modified Rankin (Stroke Patients Only)       Balance Overall balance assessment: Needs assistance Sitting-balance support: No upper extremity supported Sitting balance-Leahy Scale: Good     Standing balance support: No upper extremity supported, During functional activity Standing balance-Leahy Scale: Fair                               Pertinent Vitals/Pain Pain Assessment Pain Assessment: No/denies pain    Home Living Family/patient expects to be discharged to:: Private residence Living Arrangements: Spouse/significant other                 Additional Comments: lives with wife, unable to answer other home set-up questions    Prior Function Prior Level of Function : Patient poor historian/Family not available             Mobility Comments: ambulatory without DME       Extremity/Trunk Assessment   Upper Extremity Assessment Upper Extremity Assessment: Generalized weakness    Lower Extremity Assessment Lower Extremity Assessment: Generalized weakness (functional observed strength >3/5)    Cervical / Trunk Assessment Cervical / Trunk Assessment: Kyphotic  Communication   Communication  Communication: Hearing impairment Cueing Techniques: Tactile cues;Gestural cues  Cognition Arousal: Alert Behavior During Therapy: Flat affect Overall Cognitive Status: History of cognitive impairments - at baseline                                 General Comments: h/o  dementia. oriented to self otherwise disoriented and repeatedly asking "how long have I been like this?" despite multiple reorientation attempts. pt following majority of simple gestural commands appropriately. able to talk about going to church (was wearing church's tshirt) but unable to state wife's name. apparent cognitive deficits exacerbated by pt very HOH        General Comments General comments (skin integrity, edema, etc.): educ limited given very HOH and impaired cognition with poor STM and decreased awareness    Exercises     Assessment/Plan    PT Assessment Patient does not need any further PT services  PT Problem List         PT Treatment Interventions      PT Goals (Current goals can be found in the Care Plan section)  Acute Rehab PT Goals PT Goal Formulation: All assessment and education complete, DC therapy    Frequency       Co-evaluation               AM-PAC PT "6 Clicks" Mobility  Outcome Measure Help needed turning from your back to your side while in a flat bed without using bedrails?: None Help needed moving from lying on your back to sitting on the side of a flat bed without using bedrails?: None Help needed moving to and from a bed to a chair (including a wheelchair)?: A Little Help needed standing up from a chair using your arms (e.g., wheelchair or bedside chair)?: A Little Help needed to walk in hospital room?: A Little Help needed climbing 3-5 steps with a railing? : A Little 6 Click Score: 20    End of Session   Activity Tolerance: Patient tolerated treatment well Patient left: in bed;with call bell/phone within reach;with bed alarm set Nurse Communication: Mobility status PT Visit Diagnosis: Other abnormalities of gait and mobility (R26.89)    Time: 1610-9604 PT Time Calculation (min) (ACUTE ONLY): 12 min   Charges:   PT Evaluation $PT Eval Low Complexity: 1 Low   PT General Charges $$ ACUTE PT VISIT: 1 Visit       Ina Homes, PT, DPT Acute Rehabilitation Services  Personal: Secure Chat Rehab Office: [phone]  Malachy Chamber 06/08/2023, 9:03 AM

## 2023-06-21 ENCOUNTER — Observation Stay (HOSPITAL_COMMUNITY): Payer: 59

## 2023-06-21 ENCOUNTER — Observation Stay (HOSPITAL_BASED_OUTPATIENT_CLINIC_OR_DEPARTMENT_OTHER): Payer: 59

## 2023-06-21 ENCOUNTER — Inpatient Hospital Stay (HOSPITAL_COMMUNITY)
Admission: EM | Admit: 2023-06-21 | Discharge: 2023-08-14 | DRG: 064 | Disposition: A | Discharge status: Skilled Nursing Facility | Payer: 59 | Attending: Family Medicine | Admitting: Family Medicine

## 2023-06-21 ENCOUNTER — Encounter (HOSPITAL_COMMUNITY): Payer: Self-pay | Admitting: Internal Medicine

## 2023-06-21 ENCOUNTER — Emergency Department (HOSPITAL_COMMUNITY): Payer: 59

## 2023-06-21 ENCOUNTER — Other Ambulatory Visit: Payer: Self-pay

## 2023-06-21 DIAGNOSIS — Z91148 Patient's other noncompliance with medication regimen for other reason: Secondary | ICD-10-CM

## 2023-06-21 DIAGNOSIS — F0394 Unspecified dementia, unspecified severity, with anxiety: Secondary | ICD-10-CM | POA: Diagnosis present

## 2023-06-21 DIAGNOSIS — E860 Dehydration: Secondary | ICD-10-CM | POA: Diagnosis present

## 2023-06-21 DIAGNOSIS — N400 Enlarged prostate without lower urinary tract symptoms: Secondary | ICD-10-CM | POA: Diagnosis present

## 2023-06-21 DIAGNOSIS — Z751 Person awaiting admission to adequate facility elsewhere: Secondary | ICD-10-CM

## 2023-06-21 DIAGNOSIS — R471 Dysarthria and anarthria: Secondary | ICD-10-CM | POA: Diagnosis present

## 2023-06-21 DIAGNOSIS — K2270 Barrett's esophagus without dysplasia: Secondary | ICD-10-CM | POA: Diagnosis present

## 2023-06-21 DIAGNOSIS — I639 Cerebral infarction, unspecified: Secondary | ICD-10-CM

## 2023-06-21 DIAGNOSIS — Z79899 Other long term (current) drug therapy: Secondary | ICD-10-CM

## 2023-06-21 DIAGNOSIS — I6349 Cerebral infarction due to embolism of other cerebral artery: Secondary | ICD-10-CM | POA: Diagnosis not present

## 2023-06-21 DIAGNOSIS — R29810 Facial weakness: Secondary | ICD-10-CM | POA: Diagnosis present

## 2023-06-21 DIAGNOSIS — J449 Chronic obstructive pulmonary disease, unspecified: Secondary | ICD-10-CM | POA: Diagnosis present

## 2023-06-21 DIAGNOSIS — R4701 Aphasia: Secondary | ICD-10-CM | POA: Diagnosis present

## 2023-06-21 DIAGNOSIS — I420 Dilated cardiomyopathy: Secondary | ICD-10-CM | POA: Diagnosis present

## 2023-06-21 DIAGNOSIS — R29704 NIHSS score 4: Secondary | ICD-10-CM | POA: Diagnosis present

## 2023-06-21 DIAGNOSIS — I10 Essential (primary) hypertension: Secondary | ICD-10-CM | POA: Diagnosis present

## 2023-06-21 DIAGNOSIS — E785 Hyperlipidemia, unspecified: Secondary | ICD-10-CM | POA: Diagnosis present

## 2023-06-21 DIAGNOSIS — D631 Anemia in chronic kidney disease: Secondary | ICD-10-CM | POA: Diagnosis present

## 2023-06-21 DIAGNOSIS — Z91013 Allergy to seafood: Secondary | ICD-10-CM

## 2023-06-21 DIAGNOSIS — I130 Hypertensive heart and chronic kidney disease with heart failure and stage 1 through stage 4 chronic kidney disease, or unspecified chronic kidney disease: Secondary | ICD-10-CM | POA: Diagnosis present

## 2023-06-21 DIAGNOSIS — I2510 Atherosclerotic heart disease of native coronary artery without angina pectoris: Secondary | ICD-10-CM | POA: Diagnosis present

## 2023-06-21 DIAGNOSIS — F09 Unspecified mental disorder due to known physiological condition: Secondary | ICD-10-CM | POA: Diagnosis not present

## 2023-06-21 DIAGNOSIS — Z7982 Long term (current) use of aspirin: Secondary | ICD-10-CM

## 2023-06-21 DIAGNOSIS — Z7902 Long term (current) use of antithrombotics/antiplatelets: Secondary | ICD-10-CM

## 2023-06-21 DIAGNOSIS — R451 Restlessness and agitation: Secondary | ICD-10-CM

## 2023-06-21 DIAGNOSIS — I351 Nonrheumatic aortic (valve) insufficiency: Secondary | ICD-10-CM | POA: Diagnosis present

## 2023-06-21 DIAGNOSIS — R29708 NIHSS score 8: Secondary | ICD-10-CM | POA: Diagnosis present

## 2023-06-21 DIAGNOSIS — I5043 Acute on chronic combined systolic (congestive) and diastolic (congestive) heart failure: Secondary | ICD-10-CM | POA: Diagnosis present

## 2023-06-21 DIAGNOSIS — Z781 Physical restraint status: Secondary | ICD-10-CM

## 2023-06-21 DIAGNOSIS — N179 Acute kidney failure, unspecified: Secondary | ICD-10-CM | POA: Diagnosis present

## 2023-06-21 DIAGNOSIS — Z87891 Personal history of nicotine dependence: Secondary | ICD-10-CM

## 2023-06-21 DIAGNOSIS — R1310 Dysphagia, unspecified: Secondary | ICD-10-CM

## 2023-06-21 DIAGNOSIS — M069 Rheumatoid arthritis, unspecified: Secondary | ICD-10-CM | POA: Diagnosis present

## 2023-06-21 DIAGNOSIS — Z885 Allergy status to narcotic agent status: Secondary | ICD-10-CM

## 2023-06-21 DIAGNOSIS — N1831 Chronic kidney disease, stage 3a: Secondary | ICD-10-CM | POA: Diagnosis present

## 2023-06-21 DIAGNOSIS — F03911 Unspecified dementia, unspecified severity, with agitation: Secondary | ICD-10-CM | POA: Diagnosis present

## 2023-06-21 DIAGNOSIS — F03918 Unspecified dementia, unspecified severity, with other behavioral disturbance: Secondary | ICD-10-CM | POA: Diagnosis present

## 2023-06-21 DIAGNOSIS — G4733 Obstructive sleep apnea (adult) (pediatric): Secondary | ICD-10-CM | POA: Diagnosis present

## 2023-06-21 DIAGNOSIS — M159 Polyosteoarthritis, unspecified: Secondary | ICD-10-CM | POA: Diagnosis present

## 2023-06-21 DIAGNOSIS — Z7901 Long term (current) use of anticoagulants: Secondary | ICD-10-CM

## 2023-06-21 LAB — MAGNESIUM: Magnesium: 1.9 mg/dL (ref 1.7–2.4)

## 2023-06-21 LAB — LIPID PANEL
Cholesterol: 159 mg/dL (ref 0–200)
HDL: 31 mg/dL — ABNORMAL LOW (ref >40)
LDL Cholesterol: 112 mg/dL — ABNORMAL HIGH (ref 0–99)
Total CHOL/HDL Ratio: 5.1 {ratio}
Triglycerides: 78 mg/dL (ref <150)
VLDL: 16 mg/dL (ref 0–40)

## 2023-06-21 LAB — DIFFERENTIAL
Abs Immature Granulocytes: 0.02 10*3/uL (ref 0.00–0.07)
Basophils Absolute: 0.1 10*3/uL (ref 0.0–0.1)
Basophils Relative: 1 %
Eosinophils Absolute: 0.2 10*3/uL (ref 0.0–0.5)
Eosinophils Relative: 3 %
Immature Granulocytes: 0 %
Lymphocytes Relative: 34 %
Lymphs Abs: 2.6 10*3/uL (ref 0.7–4.0)
Monocytes Absolute: 1.1 10*3/uL — ABNORMAL HIGH (ref 0.1–1.0)
Monocytes Relative: 14 %
Neutro Abs: 3.9 10*3/uL (ref 1.7–7.7)
Neutrophils Relative %: 48 %

## 2023-06-21 LAB — CBC
HCT: 40.8 % (ref 39.0–52.0)
HCT: 40.9 % (ref 39.0–52.0)
Hemoglobin: 12.9 g/dL — ABNORMAL LOW (ref 13.0–17.0)
Hemoglobin: 13 g/dL (ref 13.0–17.0)
MCH: 27.7 pg (ref 26.0–34.0)
MCH: 28 pg (ref 26.0–34.0)
MCHC: 31.5 g/dL (ref 30.0–36.0)
MCHC: 31.9 g/dL (ref 30.0–36.0)
MCV: 87.8 fL (ref 80.0–100.0)
MCV: 87.9 fL (ref 80.0–100.0)
Platelets: 266 10*3/uL (ref 150–400)
Platelets: 266 10*3/uL (ref 150–400)
RBC: 4.64 MIL/uL (ref 4.22–5.81)
RBC: 4.66 MIL/uL (ref 4.22–5.81)
RDW: 15.7 % — ABNORMAL HIGH (ref 11.5–15.5)
RDW: 15.7 % — ABNORMAL HIGH (ref 11.5–15.5)
WBC: 7.6 10*3/uL (ref 4.0–10.5)
WBC: 7.9 10*3/uL (ref 4.0–10.5)
nRBC: 0 % (ref 0.0–0.2)
nRBC: 0 % (ref 0.0–0.2)

## 2023-06-21 LAB — COMPREHENSIVE METABOLIC PANEL
ALT: 13 U/L (ref 0–44)
AST: 18 U/L (ref 15–41)
Albumin: 3.3 g/dL — ABNORMAL LOW (ref 3.5–5.0)
Alkaline Phosphatase: 29 U/L — ABNORMAL LOW (ref 38–126)
Anion gap: 9 (ref 5–15)
BUN: 13 mg/dL (ref 8–23)
CO2: 26 mmol/L (ref 22–32)
Calcium: 10.4 mg/dL — ABNORMAL HIGH (ref 8.9–10.3)
Chloride: 105 mmol/L (ref 98–111)
Creatinine, Ser: 1.05 mg/dL (ref 0.61–1.24)
GFR, Estimated: >60 mL/min (ref >60)
Glucose, Bld: 100 mg/dL — ABNORMAL HIGH (ref 70–99)
Potassium: 4 mmol/L (ref 3.5–5.1)
Sodium: 140 mmol/L (ref 135–145)
Total Bilirubin: 0.4 mg/dL (ref 0.0–1.2)
Total Protein: 6.9 g/dL (ref 6.5–8.1)

## 2023-06-21 LAB — BASIC METABOLIC PANEL
Anion gap: 13 (ref 5–15)
BUN: 12 mg/dL (ref 8–23)
CO2: 22 mmol/L (ref 22–32)
Calcium: 10 mg/dL (ref 8.9–10.3)
Chloride: 103 mmol/L (ref 98–111)
Creatinine, Ser: 1.05 mg/dL (ref 0.61–1.24)
GFR, Estimated: >60 mL/min (ref >60)
Glucose, Bld: 98 mg/dL (ref 70–99)
Potassium: 4.3 mmol/L (ref 3.5–5.1)
Sodium: 138 mmol/L (ref 135–145)

## 2023-06-21 LAB — RAPID URINE DRUG SCREEN, HOSP PERFORMED
Amphetamines: NOT DETECTED
Barbiturates: NOT DETECTED
Benzodiazepines: NOT DETECTED
Cocaine: NOT DETECTED
Opiates: NOT DETECTED
Tetrahydrocannabinol: NOT DETECTED

## 2023-06-21 LAB — I-STAT CHEM 8, ED
BUN: 14 mg/dL (ref 8–23)
Calcium, Ion: 1.26 mmol/L (ref 1.15–1.40)
Chloride: 105 mmol/L (ref 98–111)
Creatinine, Ser: 1.1 mg/dL (ref 0.61–1.24)
Glucose, Bld: 94 mg/dL (ref 70–99)
HCT: 40 % (ref 39.0–52.0)
Hemoglobin: 13.6 g/dL (ref 13.0–17.0)
Potassium: 4.1 mmol/L (ref 3.5–5.1)
Sodium: 140 mmol/L (ref 135–145)
TCO2: 26 mmol/L (ref 22–32)

## 2023-06-21 LAB — ECHOCARDIOGRAM COMPLETE
AR max vel: 2.08 cm2
AV Area VTI: 2.52 cm2
AV Area mean vel: 2.19 cm2
AV Mean grad: 4 mm[Hg]
AV Peak grad: 7.4 mm[Hg]
Ao pk vel: 1.36 m/s
Area-P 1/2: 2.69 cm2
Height: 64 in
S' Lateral: 4.8 cm
Weight: 2116.42 [oz_av]

## 2023-06-21 LAB — URINALYSIS, ROUTINE W REFLEX MICROSCOPIC
Bilirubin Urine: NEGATIVE
Glucose, UA: NEGATIVE mg/dL
Hgb urine dipstick: NEGATIVE
Ketones, ur: NEGATIVE mg/dL
Leukocytes,Ua: NEGATIVE
Nitrite: NEGATIVE
Protein, ur: NEGATIVE mg/dL
Specific Gravity, Urine: 1.009 (ref 1.005–1.030)
pH: 6 (ref 5.0–8.0)

## 2023-06-21 LAB — HEMOGLOBIN A1C
Hgb A1c MFr Bld: 5.4 % (ref 4.8–5.6)
Mean Plasma Glucose: 108.28 mg/dL

## 2023-06-21 LAB — ETHANOL: Alcohol, Ethyl (B): <10 mg/dL (ref <10)

## 2023-06-21 LAB — GLUCOSE, CAPILLARY: Glucose-Capillary: 99 mg/dL (ref 70–99)

## 2023-06-21 LAB — APTT: aPTT: 27 s (ref 24–36)

## 2023-06-21 LAB — PROTIME-INR
INR: 1 (ref 0.8–1.2)
Prothrombin Time: 13.5 s (ref 11.4–15.2)

## 2023-06-21 LAB — CBG MONITORING, ED: Glucose-Capillary: 88 mg/dL (ref 70–99)

## 2023-06-21 MED ORDER — HYDRALAZINE HCL 20 MG/ML IJ SOLN: 5.0000 mg | INTRAMUSCULAR | Status: DC | PRN

## 2023-06-21 MED ORDER — ASPIRIN 81 MG PO CHEW
81.0000 mg | CHEWABLE_TABLET | Freq: Every day | ORAL | Status: DC
Start: 2023-06-21 — End: 2023-06-22
  Administered 2023-06-21 – 2023-06-22 (×2): 81 mg via ORAL
  Filled 2023-06-21 (×2): qty 1

## 2023-06-21 MED ORDER — ALBUTEROL SULFATE (2.5 MG/3ML) 0.083% IN NEBU: 2.5000 mg | INHALATION_SOLUTION | RESPIRATORY_TRACT | Status: DC | PRN

## 2023-06-21 MED ORDER — LORAZEPAM 2 MG/ML IJ SOLN
1.0000 mg | Freq: Once | INTRAMUSCULAR | Status: AC | PRN
  Administered 2023-06-21: 1 mg via INTRAVENOUS
  Filled 2023-06-21: qty 1

## 2023-06-21 MED ORDER — ASPIRIN 81 MG PO CHEW
324.0000 mg | CHEWABLE_TABLET | Freq: Once | ORAL | Status: DC
  Filled 2023-06-21: qty 4

## 2023-06-21 MED ORDER — SODIUM CHLORIDE 0.9 % IV SOLN: INTRAVENOUS | Status: DC

## 2023-06-21 MED ORDER — ATORVASTATIN CALCIUM 40 MG PO TABS
40.0000 mg | ORAL_TABLET | Freq: Every day | ORAL | Status: DC
  Administered 2023-06-21 – 2023-06-22 (×2): 40 mg via ORAL
  Filled 2023-06-21 (×2): qty 1

## 2023-06-21 MED ORDER — STROKE: EARLY STAGES OF RECOVERY BOOK
Freq: Once | Status: AC
  Filled 2023-06-21: qty 1

## 2023-06-21 MED ORDER — ASPIRIN 81 MG PO CHEW: 81.0000 mg | CHEWABLE_TABLET | Freq: Every day | ORAL | Status: DC

## 2023-06-21 MED ORDER — MELATONIN 5 MG PO TABS
5.0000 mg | ORAL_TABLET | Freq: Every evening | ORAL | Status: DC | PRN
  Administered 2023-06-27 – 2023-08-10 (×42): 5 mg via ORAL
  Filled 2023-06-21 (×43): qty 1

## 2023-06-21 MED ORDER — HALOPERIDOL LACTATE 5 MG/ML IJ SOLN
5.0000 mg | Freq: Once | INTRAMUSCULAR | Status: AC
  Administered 2023-06-21: 5 mg via INTRAVENOUS
  Filled 2023-06-21: qty 1

## 2023-06-21 MED ORDER — ACETAMINOPHEN 650 MG RE SUPP: 650.0000 mg | RECTAL | Status: DC | PRN

## 2023-06-21 MED ORDER — ASPIRIN 300 MG RE SUPP: 300.0000 mg | Freq: Every day | RECTAL | Status: DC

## 2023-06-21 MED ORDER — FINASTERIDE 5 MG PO TABS
5.0000 mg | ORAL_TABLET | Freq: Every day | ORAL | Status: DC
  Administered 2023-06-21 – 2023-08-14 (×55): 5 mg via ORAL
  Filled 2023-06-21 (×55): qty 1

## 2023-06-21 MED ORDER — PANTOPRAZOLE SODIUM 40 MG PO TBEC
40.0000 mg | DELAYED_RELEASE_TABLET | Freq: Every day | ORAL | Status: DC
  Administered 2023-06-21 – 2023-08-14 (×55): 40 mg via ORAL
  Filled 2023-06-21 (×55): qty 1

## 2023-06-21 MED ORDER — CLOPIDOGREL BISULFATE 75 MG PO TABS
75.0000 mg | ORAL_TABLET | Freq: Every day | ORAL | Status: DC
  Administered 2023-06-21 – 2023-06-22 (×2): 75 mg via ORAL
  Filled 2023-06-21 (×2): qty 1

## 2023-06-21 MED ORDER — ACETAMINOPHEN 325 MG PO TABS
650.0000 mg | ORAL_TABLET | ORAL | Status: DC | PRN
  Administered 2023-06-26 – 2023-06-27 (×2): 650 mg via ORAL
  Filled 2023-06-21 (×2): qty 2

## 2023-06-21 MED ORDER — PERFLUTREN LIPID MICROSPHERE
1.0000 mL | INTRAVENOUS | Status: AC | PRN
  Administered 2023-06-21: 2 mL via INTRAVENOUS

## 2023-06-21 MED ORDER — TAMSULOSIN HCL 0.4 MG PO CAPS
0.4000 mg | ORAL_CAPSULE | Freq: Every day | ORAL | Status: DC
  Administered 2023-06-21 – 2023-08-14 (×55): 0.4 mg via ORAL
  Filled 2023-06-21 (×56): qty 1

## 2023-06-21 MED ORDER — ENOXAPARIN SODIUM 40 MG/0.4ML IJ SOSY
40.0000 mg | PREFILLED_SYRINGE | Freq: Every day | INTRAMUSCULAR | Status: DC
  Administered 2023-06-21 – 2023-06-22 (×2): 40 mg via SUBCUTANEOUS
  Filled 2023-06-21 (×2): qty 0.4

## 2023-06-21 MED ORDER — ACETAMINOPHEN 160 MG/5ML PO SOLN: 650.0000 mg | ORAL | Status: DC | PRN

## 2023-06-21 MED ORDER — SENNOSIDES-DOCUSATE SODIUM 8.6-50 MG PO TABS: 1.0000 | ORAL_TABLET | Freq: Every evening | ORAL | Status: DC | PRN

## 2023-06-21 NOTE — ED Notes (Signed)
RN attempted to wake pt. He responds to pain. Pt opened eyes and attempted to get out of bed. RN asked pt questions but he didn't respond. Pt laid back in bed and closed eyes.

## 2023-06-21 NOTE — ED Triage Notes (Signed)
Patient arrived with EMS from home as a Code stroke activation , LSW 2230 06/20/23 , family reported slurred speech with mild left facial droop this evening . CBG=114 , BP= 194/90. Evaluated by EDP and Neurologist at arrival then transported to CT scan .

## 2023-06-21 NOTE — ED Provider Notes (Signed)
[place] EMERGENCY DEPARTMENT AT [facility] Provider Note   CSN: [id number] Arrival date & time: 06/21/23  8295  An emergency department physician performed an initial assessment on this suspected stroke patient at 0010.  History  Chief Complaint  Patient presents with   Code Stroke     LSW : 2230 06/20/23    Dennis [name] is a 78 y.o. male.  Patient with history of advanced dementia presents to the emergency department for possible stroke.  Patient was reportedly normal around 10:20 PM but then started to exhibit confusion and difficulty with speech.  He comes to the ER by ambulance from home.  EMS concerned about possible facial droop and difficulty forming words, but no other focal deficits noted during transport.  Patient hypertensive during transport.       Home Medications Prior to Admission medications   Medication Sig Start Date End Date Taking? Authorizing Provider  atorvastatin (LIPITOR) 40 MG tablet Take 1 tablet (40 mg total) by mouth daily. 03/16/23   Ghimire, Werner Lean, MD  carvedilol (COREG) 3.125 MG tablet Take 1 tablet (3.125 mg total) by mouth 2 (two) times daily with a meal. 03/16/23   Ghimire, Werner Lean, MD  clopidogrel (PLAVIX) 75 MG tablet Take 1 tablet (75 mg total) by mouth daily. 03/16/23   Ghimire, Werner Lean, MD  finasteride (PROSCAR) 5 MG tablet Take 1 tablet (5 mg total) by mouth daily. 03/16/23   Ghimire, Werner Lean, MD  folic acid (FOLVITE) 1 MG tablet Take 1 tablet (1 mg total) by mouth daily. 03/16/23   Ghimire, Werner Lean, MD  hydrALAZINE (APRESOLINE) 50 MG tablet Take 1 tablet (50 mg total) by mouth every 8 (eight) hours. 03/16/23   Ghimire, Werner Lean, MD  isosorbide mononitrate (IMDUR) 30 MG 24 hr tablet Take 1 tablet (30 mg total) by mouth daily. 03/16/23   Ghimire, Werner Lean, MD  methotrexate 50 MG/2ML injection Inject 25 mg into the skin once a week.    [provider]  omeprazole (PRILOSEC) 20 MG capsule Take 1 capsule (20 mg  total) by mouth daily. 03/16/23   Ghimire, Werner Lean, MD  tamsulosin (FLOMAX) 0.4 MG CAPS capsule Take 1 capsule (0.4 mg total) by mouth daily. 03/16/23   Ghimire, Werner Lean, MD      Allergies    Codeine and Shellfish allergy    Review of Systems   Review of Systems  Physical Exam Updated Vital Signs BP (!) 157/104   Pulse 82   Temp (!) 97.1 F (36.2 C) (Axillary)   Resp 19   Ht 5\' 4"  (1.626 m)   Wt 60 kg   SpO2 96%   BMI 22.71 kg/m  Physical Exam Vitals and nursing note reviewed.  Constitutional:      General: He is not in acute distress.    Appearance: He is well-developed.  HENT:     Head: Normocephalic and atraumatic.     Mouth/Throat:     Mouth: Mucous membranes are moist.  Eyes:     General: Vision grossly intact. Gaze aligned appropriately.     Extraocular Movements: Extraocular movements intact.     Conjunctiva/sclera: Conjunctivae normal.  Cardiovascular:     Rate and Rhythm: Normal rate and regular rhythm.     Pulses: Normal pulses.     Heart sounds: Normal heart sounds, S1 normal and S2 normal. No murmur heard.    No friction rub. No gallop.  Pulmonary:     Effort: Pulmonary effort  is normal. No respiratory distress.     Breath sounds: Normal breath sounds.  Abdominal:     Palpations: Abdomen is soft.     Tenderness: There is no abdominal tenderness. There is no guarding or rebound.     Hernia: No hernia is present.  Musculoskeletal:        General: No swelling.     Cervical back: Full passive range of motion without pain, normal range of motion and neck supple. No pain with movement, spinous process tenderness or muscular tenderness. Normal range of motion.     Right lower leg: No edema.     Left lower leg: No edema.  Skin:    General: Skin is warm and dry.     Capillary Refill: Capillary refill takes less than 2 seconds.     Findings: No ecchymosis, erythema, lesion or wound.  Neurological:     Mental Status: He is alert.     GCS: GCS eye  subscore is 4. GCS verbal subscore is 4. GCS motor subscore is 6.     Sensory: Sensation is intact.     Motor: Motor function is intact. No weakness or abnormal muscle tone.     Coordination: Coordination is intact.  Psychiatric:        Mood and Affect: Mood normal.        Speech: Speech normal.        Behavior: Behavior normal.     ED Results / Procedures / Treatments   Labs (all labs ordered are listed, but only abnormal results are displayed) Labs Reviewed  CBC - Abnormal; Notable for the following components:      Result Value   RDW 15.7 (*)    All other components within normal limits  DIFFERENTIAL - Abnormal; Notable for the following components:   Monocytes Absolute 1.1 (*)    All other components within normal limits  COMPREHENSIVE METABOLIC PANEL - Abnormal; Notable for the following components:   Glucose, Bld 100 (*)    Calcium 10.4 (*)    Albumin 3.3 (*)    Alkaline Phosphatase 29 (*)    All other components within normal limits  URINALYSIS, ROUTINE W REFLEX MICROSCOPIC - Abnormal; Notable for the following components:   Color, Urine STRAW (*)    All other components within normal limits  PROTIME-INR  APTT  ETHANOL  RAPID URINE DRUG SCREEN, HOSP PERFORMED  I-STAT CHEM 8, ED  CBG MONITORING, ED    EKG EKG Interpretation Date/Time:  Saturday June 21 2023 00:37:28 EST Ventricular Rate:  85 PR Interval:  183 QRS Duration:  87 QT Interval:  359 QTC Calculation: 427 R Axis:   53  Text Interpretation: Sinus rhythm Probable LVH with secondary repol abnrm Anterior Q waves, possibly due to LVH No significant change since last tracing Confirmed by Gilda Crease [phone]) on 06/21/2023 12:40:07 AM  Radiology MR BRAIN WO CONTRAST Result Date: 06/21/2023 CLINICAL DATA:  Neuro deficit, acute, stroke suspected EXAM: MRI HEAD WITHOUT CONTRAST TECHNIQUE: Multiplanar, multiecho pulse sequences of the brain and surrounding structures were obtained without  intravenous contrast. COMPARISON:  Same day CT head.  MRI head October 6, 24. FINDINGS: Brain: Punctate acute infarct in the left posterior subinsular white matter (series 6, image 57 and series 7, image 20). No evidence of acute hemorrhage, mass lesion, midline shift or hydrocephalus. Moderate T2/FLAIR hyperintensity in the white matter, compatible with chronic microvascular ischemic disease. Remote right thalamic lacunar infarct. Cerebral atrophy. Vascular: Major arterial flow voids  are maintained at the skull base. Skull and upper cervical spine: Normal marrow signal. Sinuses/Orbits: Right maxillary sinus retention cyst. Otherwise, clear sinuses. No acute orbital findings. Other: No mastoid effusions. IMPRESSION: 1. Punctate acute infarct in the left posterior subinsular white matter. 2. Moderate chronic microvascular ischemic disease. Electronically Signed   By: Feliberto Harts M.D.   On: 06/21/2023 03:19   CT HEAD CODE STROKE WO CONTRAST Result Date: 06/21/2023 CLINICAL DATA:  Code stroke.  Acute neurologic deficit EXAM: CT HEAD WITHOUT CONTRAST TECHNIQUE: Contiguous axial images were obtained from the base of the skull through the vertex without intravenous contrast. RADIATION DOSE REDUCTION: This exam was performed according to the departmental dose-optimization program which includes automated exposure control, adjustment of the mA and/or kV according to patient size and/or use of iterative reconstruction technique. COMPARISON:  None Available. FINDINGS: Brain: There is no mass, hemorrhage or extra-axial collection. There is generalized atrophy without lobar predilection. There is hypoattenuation of the periventricular white matter, most commonly indicating chronic ischemic microangiopathy. Vascular: Atherosclerotic calcification of the vertebral and internal carotid arteries at the skull base. No abnormal hyperdensity of the major intracranial arteries or dural venous sinuses. Skull: The visualized  skull base, calvarium and extracranial soft tissues are normal. Sinuses/Orbits: No fluid levels or advanced mucosal thickening of the visualized paranasal sinuses. No mastoid or middle ear effusion. The orbits are normal. ASPECTS [facility] Stroke Program Early CT Score) - Ganglionic level infarction (caudate, lentiform nuclei, internal capsule, insula, M1-M3 cortex): 7 - Supraganglionic infarction (M4-M6 cortex): 3 Total score (0-10 with 10 being normal): 10 IMPRESSION: 1. No acute intracranial abnormality. 2. ASPECTS is 10. 3. Chronic ischemic microangiopathy and generalized atrophy. These results were communicated to Dr. Erick Blinks at 12:37 am on 06/21/2023 by text page via the [facility] messaging system. Electronically Signed   By: Deatra Robinson M.D.   On: 06/21/2023 00:37    Procedures Procedures    Medications Ordered in ED Medications  LORazepam (ATIVAN) injection 1 mg (1 mg Intravenous Given 06/21/23 0253)    ED Course/ Medical Decision Making/ A&P                                 Medical Decision Making Amount and/or Complexity of Data Reviewed Labs: ordered. Radiology: ordered.  Risk Prescription drug management.   Differential diagnosis considered includes, but not limited to:  TIA; Stroke; seizure; metabolic encephalopathy  Patient presented as possible stroke.  Patient with some dysarthria and confusion, but his baseline mental status is quite poor.  No other focal findings noted.  Difficult to ascertain if the patient actually had a deficit, no thrombolytics.  Evaluated in conjunction with Dr. Derry Lory, neurology.  Screening CT unremarkable.  Recommendation was for MRI to further evaluate.   MRI does show area of stroke.  Recommendation is for medicine admission, stroke workup.        Final Clinical Impression(s) / ED Diagnoses Final diagnoses:  Cerebrovascular accident (CVA), unspecified mechanism (HCC)    Rx / DC Orders ED Discharge Orders     None          Avin Upperman, Canary Brim, MD 06/21/23 [id number]

## 2023-06-21 NOTE — Progress Notes (Signed)
VASCULAR LAB    Bilateral lower extremity venous duplex has been performed.  See CV proc for preliminary results.   Zakee Deerman, RVT 06/21/2023, 11:33 AM

## 2023-06-21 NOTE — Care Management Obs Status (Signed)
MEDICARE OBSERVATION STATUS NOTIFICATION   Patient Details  Name: Dennis [name] MRN: [id number] Date of Birth: [birth year]   Medicare Observation Status Notification Given:  Yes    Lockie Pares, RN 06/21/2023, 10:19 AM

## 2023-06-21 NOTE — Care Management (Signed)
Transition of Care [facility]) - Inpatient Brief Assessment   Patient Details  Name: Dennis [name] MRN: [id number] Date of Birth: 05-18-1946  [facility] CM/SW Contact:    Lockie Pares, RN Phone Number: 06/21/2023, 10:39 AM   Clinical Narrative:  Patient  with a history of dementia no PCP was seen in the ED on 1/19 and had a 2 day holding stay due to safety concerns and aggressive behavior.  He lives with his SO of many years [place]., who is also having memory trouble. They are not in the guardianship of DSS< however Marlana Latus is involved in their case. [name] is a Network engineer and has been assisting them, however  has been "backing off" due to various issues.  The patient has no children, however licia has a daughter. [name] , their neighbor has spoken to this [facility] and states she will text Marlana Latus to let her know [name] is here. As Licia's numbers are out of order, [name] took the OBS notice. She will give [place] a update, and staff can call [name] with any questions.   He is active with [facility] for RN, PT OT and social work. [facility] place for mom was consulted to try to get memory care or assisted living. The patient  may need SNF to LTC/Memory care  TOC will follow Transition of Care Asessment: Insurance and Status: Insurance coverage has been reviewed Patient has primary care physician: No Home environment has been reviewed: Lives at home with SO Prior level of function:: Independent Prior/Current Home Services: Current home services (Home health with [facility], followed by DSS) Social Drivers of Health Review: SDOH reviewed no interventions necessary Readmission risk has been reviewed: Yes Transition of care needs: transition of care needs identified, TOC will continue to follow

## 2023-06-21 NOTE — Evaluation (Signed)
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 male presented to ED after significant other noted him to be acutely more confused than his baseline and having speech difficulty. EMS noted him to have (L) facial droop and expressive aphasia as well as hypertensive en route to ED. MRI (06/21/23) revealed, "Punctate acute infarct in the left posterior subinsular white matter... small area of acute infarct in the more superior left frontal cortex. There also 2 punctate areas of diffusion hyperintensity without ADC correlate in the superior right frontal lobe which may be areas of subacute ischemia". Previous acute SLP services provided for dysphagia on 03/03/23, recommended advancement of diet to dys 3/thin liquids given improvement in mentation and respiratory status. PMH: HLD, BPH, HTN, dementia, rheumatoid arthritis, COPD, Barrett's esophagus, OSA does not use CPAP, history of CVA.   Assessment / Plan / Recommendation Clinical Impression  Pt presents with suspected expressive/receptive aphasia, dysarthria and cognitive-communication deficits s/p CVA. Per chart, he received Ativan for MRI and later Haldol in ED at  0656. At time of eval, pt alert and following some commands with heavy cues/repetition, however cannot r/o impact of sedative medication of performance. Pt communicating primarily with gestures. In a few instances he pointed to bedside urinal and when asked if he needed to use it he kept pointing and nodded his head to indicate "yes". When asked his name he produced a single utterance that was unintelligible. When asked if his name was Xaiver, he shook his head. Any additional utterances were unintelligible. He followed simple 1 step commands when distractions were removed with 80% accuracy, though when attention was elsewhere this decreased. When asked to point to a named object in a field of 2, pt did not attempt task. He did grab a phone on command and put up to his ear when asked. Recommend ongoing acute SLP services to treat for above noted cognitive-linguistic deficits.    SLP Assessment  SLP Recommendation/Assessment: Patient needs continued Speech Lanaguage Pathology Services SLP Visit Diagnosis: Dysarthria and anarthria (R47.1);Aphasia (R47.01);Cognitive communication deficit (R41.841)    Recommendations for follow up therapy are one component of a multi-disciplinary discharge planning process, led by the attending physician.  Recommendations may be updated based on patient status, additional functional criteria and insurance authorization.    Follow Up Recommendations   (TBD)    Assistance Recommended at Discharge  Frequent or constant Supervision/Assistance  Functional Status Assessment Patient has had a recent decline in their functional status and demonstrates the ability to make significant improvements in function in a reasonable and predictable amount of time.  Frequency and Duration min 2x/week  2 weeks      SLP Evaluation Cognition  Overall Cognitive Status:  Impaired/Different from baseline Arousal/Alertness: Suspect due to medications Attention: Focused;Sustained Focused  Attention: Appears intact Sustained Attention: Impaired Sustained Attention Impairment: Verbal basic       Comprehension  Auditory Comprehension Overall Auditory Comprehension: Impaired Commands: Impaired One Step Basic Commands: 25-49% accurate Interfering Components: Attention Visual Recognition/Discrimination Discrimination: Not tested Reading Comprehension Reading Status: Not tested    Expression Expression Primary Mode of Expression: Nonverbal - gestures Verbal Expression Overall Verbal Expression: Impaired Initiation: Impaired Repetition: Impaired Level of Impairment: Word level Naming: Impairment Confrontation: Impaired Interfering Components: Attention Written Expression Written Expression: Not tested   Oral / Motor  Oral Motor/Sensory Function Overall Oral Motor/Sensory Function: Moderate impairment Facial Symmetry: Abnormal symmetry left;Suspected CN VII (facial) dysfunction Motor Speech Overall Motor Speech: Impaired Articulation: Impaired Level of Impairment: Word Intelligibility: Intelligibility reduced Word: 0-24% accurate Motor Speech Errors: Unaware             Avie Echevaria, MA, CCC-SLP Acute Rehabilitation Services Office Number: [phone]  Paulette Blanch 06/21/2023, 1:59 PM

## 2023-06-21 NOTE — Care Management (Deleted)
Patient is known to this RNCM from previous ED visit. The patient presented as a code stroke. Both he and his SO licia have dementia. He has no children. They have a Neighbor who tries to help them named [name]. Attempted to give MOON, Both Licia numbers are disconnected, Left message with [name].

## 2023-06-21 NOTE — ED Notes (Signed)
 Patient transported to MRI

## 2023-06-21 NOTE — TOC Progression Note (Signed)
Transition of Care [facility]) - Progression Note    Patient Details  Name: Dennis [name] MRN: [id number] Date of Birth: [birth year]  Transition of Care [facility]) CM/SW Contact  Dellie Burns [place], [place] Phone Number: 06/21/2023, 2:03 PM  Clinical Narrative:  Spoke to Marlana Latus with DSS [phone] re guardianship. Per Joni Reining, the current plan is for DSS to purse guardianship given pt's history of dementia, inability to care for self and no adequate caregiver. Joni Reining aware PT/OT pending but pt appears to need SNF placement. SW will follow and assist as indicated.   Dellie Burns, MSW, LCSW [phone] (coverage)       Expected Discharge Plan: Skilled Nursing Facility Barriers to Discharge: Insurance Authorization, SNF Pending bed offer, Continued Medical Work up, Unsafe home situation  Expected Discharge Plan and Services     Post Acute Care Choice: Skilled Nursing Facility Living arrangements for the past 2 months: Single Family Home                                       Social Determinants of Health (SDOH) Interventions SDOH Screenings   Food Insecurity: No Food Insecurity (07/26/2020)   Received from [facility]  Transportation Needs: No Transportation Needs (04/04/2022)   Received from [facility], [facility]  Tobacco Use: Medium Risk (06/07/2023)    Readmission Risk Interventions     No data to display

## 2023-06-21 NOTE — Evaluation (Signed)
Clinical/Bedside Swallow Evaluation Patient Details  Name: Dennis [name] MRN: [id number] Date of Birth: Feb 08, 1946  Today's Date: 06/21/2023 Time: SLP Start Time (ACUTE ONLY): 1301 SLP Stop Time (ACUTE ONLY): 1314 SLP Time Calculation (min) (ACUTE ONLY): 13 min  Past Medical History:  Past Medical History:  Diagnosis Date   Arthritis    Hypertension    Past Surgical History:  Past Surgical History:  Procedure Laterality Date   CHOLECYSTECTOMY     THROAT SURGERY     HPI:  Pt is a 78 y.o. male presented to ED after significant other noted him to be acutely more confused than his baseline and having speech difficulty. EMS noted him to have (L) facial droop and expressive aphasia as well as hypertensive en route to ED. MRI (06/21/23) revealed, "Punctate acute infarct in the left posterior subinsular white matter... small area of acute infarct in the more superior left frontal cortex. There also 2 punctate areas of diffusion hyperintensity without ADC correlate in the superior right frontal lobe which may be areas of subacute ischemia". Previous acute SLP services provided for dysphagia on 03/03/23, recommended advancement of diet to dys 3/thin liquids given improvement in mentation and respiratory status. PMH: HLD, BPH, HTN, dementia, rheumatoid arthritis, COPD, Barrett's esophagus, OSA does not use CPAP, history of CVA.    Assessment / Plan / Recommendation  Clinical Impression  Pt presents with suspected oropharyngeal dysphagia s/p CVA. Per chart, he received Ativan for MRI and later Haldol in ED at 0656. At time of eval, pt alert and following some commands with heavy cues/repetition, however cannot r/o impact of sedative medication on performance. Pt with L facial droop, otherwise oral motor examination limited due to poor command following. Ice chips were orally manipulated in 1/2 attempts and he required consistent cues to increase awareness. Palpable swallow noted with both attempts. Hand  over hand assisted provided for sips of thin liquids by cup and straw. Labial seal at edge of cup and straw was reduced and no active sucking or sipping noted. Thin liquid that passively spilled into oral cavity, resulted in anterior spillage and overt, strong coughing across all attempts. Pt declined solid POs, although RN reports small tsp of puree was swallowed OK earlier this date. Recommend NPO at this time with QID oral care. Will attempt to f/u on subsequent date to assess any improvement with POs.  SLP Visit Diagnosis: Dysphagia, unspecified (R13.10)    Aspiration Risk  Severe aspiration risk    Diet Recommendation NPO    Medication Administration: Via alternative means    Other  Recommendations Oral Care Recommendations: Oral care QID    Recommendations for follow up therapy are one component of a multi-disciplinary discharge planning process, led by the attending physician.  Recommendations may be updated based on patient status, additional functional criteria and insurance authorization.  Follow up Recommendations  (TBD)      Assistance Recommended at Discharge    Functional Status Assessment Patient has had a recent decline in their functional status and demonstrates the ability to make significant improvements in function in a reasonable and predictable amount of time.  Frequency and Duration min 2x/week  2 weeks       Prognosis Prognosis for improved oropharyngeal function: Fair Barriers to Reach Goals: Cognitive deficits;Time post onset      Swallow Study   General Date of Onset: 06/21/23 HPI: Pt is a 78 y.o. male presented to ED after significant other noted him to be acutely more confused than  his baseline and having speech difficulty. EMS noted him to have (L) facial droop and expressive aphasia as well as hypertensive en route to ED. MRI (06/21/23) revealed, "Punctate acute infarct in the left posterior subinsular white matter... small area of acute infarct in the  more superior left frontal cortex. There also 2 punctate areas of diffusion hyperintensity without ADC correlate in the superior right frontal lobe which may be areas of subacute ischemia". Previous acute SLP services provided for dysphagia on 03/03/23, recommended advancement of diet to dys 3/thin liquids given improvement in mentation and respiratory status. PMH: HLD, BPH, HTN, dementia, rheumatoid arthritis, COPD, Barrett's esophagus, OSA does not use CPAP, history of CVA. Type of Study: Bedside Swallow Evaluation Previous Swallow Assessment: see HPI Diet Prior to this Study: Regular;Thin liquids (Level 0) (RN not giving POs due to concerns for dysphagia) Temperature Spikes Noted: No Respiratory Status: Room air History of Recent Intubation: No Behavior/Cognition: Alert;Confused;Requires cueing;Doesn't follow directions Oral Cavity Assessment: Within Functional Limits Oral Care Completed by SLP: No Oral Cavity - Dentition: Poor condition;Missing dentition Vision:  (difficult to assess) Self-Feeding Abilities: Needs assist Patient Positioning: Upright in bed;Postural control adequate for testing Baseline Vocal Quality: Low vocal intensity;Hoarse Volitional Cough: Cognitively unable to elicit Volitional Swallow: Unable to elicit    Oral/Motor/Sensory Function Overall Oral Motor/Sensory Function: Moderate impairment Facial Symmetry: Abnormal symmetry left;Suspected CN VII (facial) dysfunction   Ice Chips Ice chips: Impaired Presentation: Spoon Oral Phase Impairments: Poor awareness of bolus;Impaired mastication;Reduced lingual movement/coordination Oral Phase Functional Implications: Oral holding;Prolonged oral transit   Thin Liquid Thin Liquid: Impaired Presentation: Cup;Straw Oral Phase Impairments: Reduced labial seal;Reduced lingual movement/coordination;Poor awareness of bolus Oral Phase Functional Implications:  (anterior loss) Pharyngeal  Phase Impairments: Cough - Immediate     Nectar Thick Nectar Thick Liquid: Not tested   Honey Thick Honey Thick Liquid: Not tested   Puree Puree: Not tested   Solid     Solid: Not tested       Avie Echevaria, MA, CCC-SLP Acute Rehabilitation Services Office Number: [phone]  Paulette Blanch 06/21/2023,1:45 PM

## 2023-06-21 NOTE — Consult Note (Signed)
NEUROLOGY CONSULT NOTE   Date of service: June 21, 2023 Patient Name: Dennis [name] MRN:  [id number] DOB:  08-22-1945 Chief Complaint: "code stroke, confused" Requesting Provider: Gilda Crease, *  History of Present Illness  Dennis [name] is a 78 y.o. male with hx of probable advanced dementia, arthritis, HTN who presents as a code stroke with confusion. Per EMS, wife noted an acute change and more confused. He is not weak. They are worried about a potential L facial droop and so brought in as a code stroke.  I have seen Dennis [name] in the past and I am familiar with his baseline. On my evaluation, he has cognitive deficit. Speech is fluent but can identify some but not all objects, and he is disoriented to time and place.  LKW: 2230 Modified rankin score: 3-Moderate disability-requires help but walks WITHOUT assistance IV Thrombolysis: not offered, appears confused rather than aphasic. Low overall suspicion for stroke. EVT: not offered, low suspicion for stroke.  NIHSS components Score: Comment  1a Level of Conscious 0[x]  1[]  2[]  3[]      1b LOC Questions 0[]  1[]  2[x]       1c LOC Commands 0[]  1[x]  2[]       2 Best Gaze 0[x]  1[]  2[]       3 Visual 0[x]  1[]  2[]  3[]      4 Facial Palsy 0[x]  1[]  2[]  3[]      5a Motor Arm - left 0[x]  1[]  2[]  3[]  4[]  UN[]    5b Motor Arm - Right 0[x]  1[]  2[]  3[]  4[]  UN[]    6a Motor Leg - Left 0[x]  1[]  2[]  3[]  4[]  UN[]    6b Motor Leg - Right 0[x]  1[]  2[]  3[]  4[]  UN[]    7 Limb Ataxia 0[x]  1[]  2[]  3[]  UN[]     8 Sensory 0[x]  1[]  2[]  UN[]      9 Best Language 0[]  1[x]  2[]  3[]      10 Dysarthria 0[x]  1[]  2[]  UN[]      11 Extinct. and Inattention 0[x]  1[]  2[]       TOTAL: 4      ROS  Comprehensive ROS performed and pertinent positives documented in HPI   Past History   Past Medical History:  Diagnosis Date   Arthritis    Hypertension     Past Surgical History:  Procedure Laterality Date   CHOLECYSTECTOMY     THROAT SURGERY      Family  History: No family history on file.  Social History  reports that he has quit smoking. His smoking use included cigarettes. He has never used smokeless tobacco. He reports that he does not drink alcohol and does not use drugs.  Allergies  Allergen Reactions   Codeine    Shellfish Allergy     Medications   Current Facility-Administered Medications:    LORazepam (ATIVAN) injection 1 mg, 1 mg, Intravenous, Once PRN, Pollina, Canary Brim, MD  Current Outpatient Medications:    atorvastatin (LIPITOR) 40 MG tablet, Take 1 tablet (40 mg total) by mouth daily., Disp: 30 tablet, Rfl: 2   carvedilol (COREG) 3.125 MG tablet, Take 1 tablet (3.125 mg total) by mouth 2 (two) times daily with a meal., Disp: 60 tablet, Rfl: 2   clopidogrel (PLAVIX) 75 MG tablet, Take 1 tablet (75 mg total) by mouth daily., Disp: 30 tablet, Rfl: 2   finasteride (PROSCAR) 5 MG tablet, Take 1 tablet (5 mg total) by mouth daily., Disp: 30 tablet, Rfl: 2   folic acid (FOLVITE) 1 MG tablet, Take 1 tablet (1 mg  total) by mouth daily., Disp: 30 tablet, Rfl: 2   hydrALAZINE (APRESOLINE) 50 MG tablet, Take 1 tablet (50 mg total) by mouth every 8 (eight) hours., Disp: 90 tablet, Rfl: 2   isosorbide mononitrate (IMDUR) 30 MG 24 hr tablet, Take 1 tablet (30 mg total) by mouth daily., Disp: 30 tablet, Rfl: 2   methotrexate 50 MG/2ML injection, Inject 25 mg into the skin once a week., Disp: , Rfl:    omeprazole (PRILOSEC) 20 MG capsule, Take 1 capsule (20 mg total) by mouth daily., Disp: 30 capsule, Rfl: 2   tamsulosin (FLOMAX) 0.4 MG CAPS capsule, Take 1 capsule (0.4 mg total) by mouth daily., Disp: 30 capsule, Rfl: 2  Vitals   Vitals:   07/14/23 0033 07/14/2023 0035 07/14/23 0040 July 14, 2023 0045  BP:  (!) 191/89  (!) 187/92  Pulse:  84 78 81  Resp:  12 18 20   Temp: 98.3 F (36.8 C)     TempSrc: Oral     SpO2:  98% 97% 98%  Weight:      Height:        Body mass index is 22.71 kg/m.  Physical Exam   General: Laying  comfortably in bed; in no acute distress.  HENT: Normal oropharynx and mucosa. Normal external appearance of ears and nose.  Neck: Supple, no pain or tenderness  CV: No JVD. No peripheral edema.  Pulmonary: Symmetric Chest rise. Normal respiratory effort.  Abdomen: Soft to touch, non-tender.  Ext: No cyanosis, edema, or deformity  Skin: No rash. Normal palpation of skin.   Musculoskeletal: Normal digits and nails by inspection. No clubbing.   Neurologic Examination  Mental status/Cognition: Alert, oriented to self, but not to place, month and year, poor attention. Has executive dysfunction. Speech/language: Fluent, comprehension intact intermittently to simple commands, names some but not all objects. Cranial nerves:   CN II Pupils equal and reactive to light, no VF deficits    CN III,IV,VI EOM intact, no gaze preference or deviation, no nystagmus    CN V normal sensation in V1, V2, and V3 segments bilaterally    CN VII no asymmetry, no nasolabial fold flattening    CN VIII normal hearing to speech    CN IX & X normal palatal elevation, no uvular deviation    CN XI 5/5 head turn and 5/5 shoulder shrug bilaterally    CN XII midline tongue protrusion    Motor:  Muscle bulk: normal, tone normal, pronator drift none tremor none Mvmt Root Nerve  Muscle Right Left Comments  SA C5/6 Ax Deltoid 5 5   EF C5/6 Mc Biceps 5 5   EE C6/7/8 Rad Triceps 5 5   WF C6/7 Med FCR     WE C7/8 PIN ECU     F Ab C8/T1 U ADM/FDI 5 5   HF L1/2/3 Fem Illopsoas 5 5   KE L2/3/4 Fem Quad 5 5   DF L4/5 D Peron Tib Ant 5 5   PF S1/2 Tibial Grc/Sol 5 5    Sensation:  Light touch Intact throughout   Pin prick    Temperature    Vibration   Proprioception    Coordination/Complex Motor:  - Finger to Nose intact BL - Heel to shin unable to comprehend how to do - Rapid alternating movement are slowed BL - Gait: deferred.  Labs/Imaging/Neurodiagnostic studies   CBC:  Recent Labs  Lab 14-Jul-2023 0016  07-14-23 0017  WBC 7.9  --   NEUTROABS 3.9  --  HGB 13.0 13.6  HCT 40.8 40.0  MCV 87.9  --   PLT 266  --    Basic Metabolic Panel:  Lab Results  Component Value Date   NA 140 06/21/2023   K 4.1 06/21/2023   CO2 26 06/21/2023   GLUCOSE 94 06/21/2023   BUN 14 06/21/2023   CREATININE 1.10 06/21/2023   CALCIUM 10.4 (H) 06/21/2023   GFRNONAA >60 06/21/2023   GFRAA  11/20/2006    >60        The eGFR has been calculated using the MDRD equation. This calculation has not been validated in all clinical   Lipid Panel:  Lab Results  Component Value Date   LDLCALC 105 (H) 02/24/2023   HgbA1c:  Lab Results  Component Value Date   HGBA1C 5.5 02/23/2023   Urine Drug Screen:     Component Value Date/Time   LABOPIA NONE DETECTED 06/21/2023 0047   COCAINSCRNUR NONE DETECTED 06/21/2023 0047   LABBENZ NONE DETECTED 06/21/2023 0047   AMPHETMU NONE DETECTED 06/21/2023 0047   THCU NONE DETECTED 06/21/2023 0047   LABBARB NONE DETECTED 06/21/2023 0047    Alcohol Level     Component Value Date/Time   ETH <10 06/21/2023 0016   INR  Lab Results  Component Value Date   INR 1.0 06/21/2023   APTT  Lab Results  Component Value Date   APTT 27 06/21/2023   AED levels: No results found for: "PHENYTOIN", "ZONISAMIDE", "LAMOTRIGINE", "LEVETIRACETA"  CT Head without contrast(Personally reviewed): CTH was negative for a large hypodensity concerning for a large territory infarct or hyperdensity concerning for an ICH  MRI Brain: Pending  ASSESSMENT   ASHRAF MESTA is a 78 y.o. male with hx of probable advanced dementia, arthritis, HTN who presents as a code stroke with confusion.  He appears to have executive dysfunction rather than true aphasia, consistent with his hx of dementia. Seems similar to my prior evaluation in oct of last year.  RECOMMENDATIONS  - Will get MRI Brain but if negative, no further  workup. ______________________________________________________________________    Welton Flakes, MD [facility]

## 2023-06-21 NOTE — Evaluation (Signed)
Physical Therapy Evaluation Patient Details Name: Dennis [name] MRN: [id number] DOB: 17-Aug-1945 Today's Date: 06/21/2023  History of Present Illness  The pt is a 78 yo male presenting 2/1 from home with concern for stroke (slurred speech and mild L facial droop). MRI showed:  small area of acute infarct in the more superior left frontal cortex and 2 areas of subacute ischemia in superior R frontal lobe. PMH includes: dementia, arthritis, HTN, HLD, RA, COPD, OSA, and CVA.   Clinical Impression  Pt in bed upon arrival of PT, opening eyes to stimulation, nodded his head "yes" to "Is your name [name]?" But not verbalizing answers to any questions throughout evaluation. He followed commands for LE movement inconsistently, but equally between R and L LE. He did not answer any questions regarding PLOF or assist at home, per chart review pt ambulates without assistance or DME, and lives with a significant other who also has memory issues. He required modA to complete bed mobility and initial sit-stand transfers at this time. He was limited to ~2 ft laterally along EOB and then ~2 ft forwards and back with modA to steady and facilitate wt shift for stepping. Given pt's current deficits and uncertain assistance at home, recommend continued inpatient rehab <3hours/day to facilitate return to maximal independence.      If plan is discharge home, recommend the following: Two people to help with walking and/or transfers;A lot of help with bathing/dressing/bathroom;Direct supervision/assist for medications management;Direct supervision/assist for financial management;Assist for transportation;Help with stairs or ramp for entrance;Supervision due to cognitive status   Can travel by private vehicle   No    Equipment Recommendations Other (comment) (defer to post acute)  Recommendations for Other Services       Functional Status Assessment Patient has had a recent decline in their functional status and  demonstrates the ability to make significant improvements in function in a reasonable and predictable amount of time.     Precautions / Restrictions Precautions Precautions: Fall Precaution Comments: watch BP Restrictions Weight Bearing Restrictions Per Provider Order: No      Mobility  Bed Mobility Overal bed mobility: Needs Assistance Bed Mobility: Supine to Sit, Sit to Supine     Supine to sit: Mod assist, HOB elevated, Used rails Sit to supine: Mod assist, HOB elevated, Used rails   General bed mobility comments: modA to complete scooting to EOB and trunk elevation. assist to return LE to bed    Transfers Overall transfer level: Needs assistance Equipment used: Rolling walker (2 wheels), 1 person hand held assist Transfers: Sit to/from Stand Sit to Stand: Mod assist           General transfer comment: modA for balance, attempted with RW and HHA.    Ambulation/Gait Ambulation/Gait assistance: Mod assist Gait Distance (Feet): 2 Feet Assistive device: Rolling walker (2 wheels) Gait Pattern/deviations: Step-to pattern, Decreased stride length, Trunk flexed, Narrow base of support Gait velocity: decreased     General Gait Details: small lateral and forwards steps from EOB with BUE support on RW and modA to steady. narrow BOS  Modified Rankin (Stroke Patients Only) Modified Rankin (Stroke Patients Only) Pre-Morbid Rankin Score: Moderate disability Modified Rankin: Moderately severe disability     Balance Overall balance assessment: Needs assistance Sitting-balance support: No upper extremity supported Sitting balance-Leahy Scale: Fair   Postural control: Posterior lean Standing balance support: Bilateral upper extremity supported, During functional activity Standing balance-Leahy Scale: Poor  Pertinent Vitals/Pain Pain Assessment Pain Assessment: No/denies pain    Home Living Family/patient expects to be  discharged to:: Private residence Living Arrangements: Spouse/significant other                 Additional Comments: pt unable to contribute to hx during my evaluation, per chart he lives with a significant other who also has memory issues and is unable toassist, 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    Prior Function Prior Level of Function : Patient poor historian/Family not available             Mobility Comments: pt unable to state at this time, per chart, pt ambulatory without DME       Extremity/Trunk Assessment   Upper Extremity Assessment Upper Extremity Assessment: Defer to OT evaluation    Lower Extremity Assessment Lower Extremity Assessment: Difficult to assess due to impaired cognition (pt moving against gravity but not holding for MMT (?cog) for either LE)    Cervical / Trunk Assessment Cervical / Trunk Assessment: Kyphotic  Communication   Communication Communication: Difficulty following commands/understanding;Difficulty communicating thoughts/reduced clarity of speech;Hearing impairment Following commands: Follows one step commands inconsistently Cueing Techniques: Verbal cues;Gestural cues;Tactile cues;Visual cues  Cognition Arousal: Lethargic Behavior During Therapy: Flat affect Overall Cognitive Status: Impaired/Different from baseline                                 General Comments: difficult to assess due to lethargy and impaired communication. Pt nodding accurately to name, location, unclear on use of DME even when asked in yes/no format like name. pt with 3-4 attempts at verbalizations but unable to determine what he is saying, did follow multimodal cues for movement of LE. hx of dementia        General Comments General comments (skin integrity, edema, etc.): BP elevated but stable and <180        Assessment/Plan    PT Assessment Patient needs continued PT services  PT Problem List Decreased strength;Decreased activity  tolerance;Decreased balance;Decreased mobility;Decreased coordination;Decreased cognition;Decreased safety awareness       PT Treatment Interventions DME instruction;Gait training;Stair training;Functional mobility training;Therapeutic activities;Therapeutic exercise;Balance training;Patient/family education    PT Goals (Current goals can be found in the Care Plan section)  Acute Rehab PT Goals Patient Stated Goal: none stated PT Goal Formulation: With patient Time For Goal Achievement: 07/05/23 Potential to Achieve Goals: Fair    Frequency Min 1X/week        AM-PAC PT "6 Clicks" Mobility  Outcome Measure Help needed turning from your back to your side while in a flat bed without using bedrails?: A Lot Help needed moving from lying on your back to sitting on the side of a flat bed without using bedrails?: A Lot Help needed moving to and from a bed to a chair (including a wheelchair)?: A Lot Help needed standing up from a chair using your arms (e.g., wheelchair or bedside chair)?: A Lot Help needed to walk in hospital room?: Total Help needed climbing 3-5 steps with a railing? : Total 6 Click Score: 10    End of Session Equipment Utilized During Treatment: Gait belt Activity Tolerance: Patient tolerated treatment well;Patient limited by fatigue Patient left: in bed;with call bell/phone within reach;with bed alarm set Nurse Communication: Mobility status PT Visit Diagnosis: Unsteadiness on feet (R26.81);Muscle weakness (generalized) (M62.81)    Time: 1610-9604 PT Time Calculation (min) (ACUTE ONLY): 18 min   Charges:  PT Evaluation $PT Eval Moderate Complexity: 1 Mod   PT General Charges $$ ACUTE PT VISIT: 1 Visit         Vickki Muff, PT, DPT   Acute Rehabilitation Department Office [phone] Secure Chat Communication Preferred  Ronnie Derby 06/21/2023, 3:54 PM

## 2023-06-21 NOTE — ED Notes (Signed)
RN coban wrapped pt iv

## 2023-06-21 NOTE — ED Notes (Signed)
Transported to CT scan

## 2023-06-21 NOTE — Progress Notes (Addendum)
STROKE TEAM PROGRESS NOTE   BRIEF HPI [name] is a 78 y.o. male with PMH significant for HTN, probable advanced dementia, arthritis who was BIB EMS as a CODE STROKE due to confusion. On neurology assessment, patient was confused with some aphasia. CTH negative NIH on Admission: 4  MRI shows Punctate acute infarct in the left posterior subinsular white matter. Admitted for stroke workup.   INTERIM HISTORY/SUBJECTIVE   On exam, patient is drowsy (did receive Haldol this am) with very dysarthric speech and expressive aphasia. He only follows 1-2 simple commands. Was able to say he was in the hospital and his name. Right facial droop, inconsistent blink to threat on right.   OBJECTIVE  CBC    Component Value Date/Time   WBC 7.9 06/21/2023 0016   RBC 4.64 06/21/2023 0016   HGB 13.6 06/21/2023 0017   HCT 40.0 06/21/2023 0017   PLT 266 06/21/2023 0016   MCV 87.9 06/21/2023 0016   MCH 28.0 06/21/2023 0016   MCHC 31.9 06/21/2023 0016   RDW 15.7 (H) 06/21/2023 0016   LYMPHSABS 2.6 06/21/2023 0016   MONOABS 1.1 (H) 06/21/2023 0016   EOSABS 0.2 06/21/2023 0016   BASOSABS 0.1 06/21/2023 0016    BMET    Component Value Date/Time   NA 140 06/21/2023 0017   K 4.1 06/21/2023 0017   CL 105 06/21/2023 0017   CO2 26 06/21/2023 0016   GLUCOSE 94 06/21/2023 0017   BUN 14 06/21/2023 0017   CREATININE 1.10 06/21/2023 0017   CALCIUM 10.4 (H) 06/21/2023 0016   GFRNONAA >60 06/21/2023 0016    IMAGING past 24 hours MR BRAIN WO CONTRAST Result Date: 06/21/2023 CLINICAL DATA:  Neuro deficit, acute, stroke suspected EXAM: MRI HEAD WITHOUT CONTRAST TECHNIQUE: Multiplanar, multiecho pulse sequences of the brain and surrounding structures were obtained without intravenous contrast. COMPARISON:  Same day CT head.  MRI head October 6, 24. FINDINGS: Brain: Punctate acute infarct in the left posterior subinsular white matter (series 6, image 57 and series 7, image 20). No evidence of acute  hemorrhage, mass lesion, midline shift or hydrocephalus. Moderate T2/FLAIR hyperintensity in the white matter, compatible with chronic microvascular ischemic disease. Remote right thalamic lacunar infarct. Cerebral atrophy. Vascular: Major arterial flow voids are maintained at the skull base. Skull and upper cervical spine: Normal marrow signal. Sinuses/Orbits: Right maxillary sinus retention cyst. Otherwise, clear sinuses. No acute orbital findings. Other: No mastoid effusions. IMPRESSION: 1. Punctate acute infarct in the left posterior subinsular white matter. 2. Moderate chronic microvascular ischemic disease. Electronically Signed   By: Feliberto Harts M.D.   On: 06/21/2023 03:19   CT HEAD CODE STROKE WO CONTRAST Result Date: 06/21/2023 CLINICAL DATA:  Code stroke.  Acute neurologic deficit EXAM: CT HEAD WITHOUT CONTRAST TECHNIQUE: Contiguous axial images were obtained from the base of the skull through the vertex without intravenous contrast. RADIATION DOSE REDUCTION: This exam was performed according to the departmental dose-optimization program which includes automated exposure control, adjustment of the mA and/or kV according to patient size and/or use of iterative reconstruction technique. COMPARISON:  None Available. FINDINGS: Brain: There is no mass, hemorrhage or extra-axial collection. There is generalized atrophy without lobar predilection. There is hypoattenuation of the periventricular white matter, most commonly indicating chronic ischemic microangiopathy. Vascular: Atherosclerotic calcification of the vertebral and internal carotid arteries at the skull base. No abnormal hyperdensity of the major intracranial arteries or dural venous sinuses. Skull: The visualized skull base, calvarium and extracranial soft tissues are  normal. Sinuses/Orbits: No fluid levels or advanced mucosal thickening of the visualized paranasal sinuses. No mastoid or middle ear effusion. The orbits are normal. ASPECTS  [facility] Stroke Program Early CT Score) - Ganglionic level infarction (caudate, lentiform nuclei, internal capsule, insula, M1-M3 cortex): 7 - Supraganglionic infarction (M4-M6 cortex): 3 Total score (0-10 with 10 being normal): 10 IMPRESSION: 1. No acute intracranial abnormality. 2. ASPECTS is 10. 3. Chronic ischemic microangiopathy and generalized atrophy. These results were communicated to Dr. Erick Blinks at 12:37 am on 06/21/2023 by text page via the [facility] messaging system. Electronically Signed   By: Deatra Robinson M.D.   On: 06/21/2023 00:37    Vitals:   06/21/23 0700 06/21/23 0715 06/21/23 0730 06/21/23 0745  BP: (!) 155/90 139/77 (!) 142/71 (!) 144/86  Pulse: 78 75 68 67  Resp: 18 16 17 17   Temp:      TempSrc:      SpO2: 96% 93% 95% 95%  Weight:      Height:         PHYSICAL EXAM General:  Lethargic, well-nourished, well-developed patient in no acute distress CV: Regular rate and rhythm on monitor Respiratory:  Regular, unlabored respirations on room air GI: Abdomen soft and nontender   NEURO:  Mental Status: lethargic, knows he is in the hospital unable to answer further orientation questions.  Speech/Language: severely dysarthric with expressive aphasia.   Cranial Nerves:  II: PERRL. Inconsistent blink to threat on right.  III, IV, VI: EOMI. Eyelids elevate symmetrically.  V: Sensation is intact to light touch and symmetrical to face.  VII: Right facial droop VIII: hearing intact to voice. IX, X: Palate elevates symmetrically. Phonation is normal.  ZO:[id number] shrug 5/5. XII: tongue is midline without fasciculations. Motor:  Antigravity spontaneous movement seen throughout. Does not move on command.  Tone: is normal and bulk is normal Sensation- withdraws throughout.  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  MRI  Punctate acute infarct in the left posterior subinsular white matter.  Left frontal linear cortical infarct, subacute right frontal punctate infarct. 2D Echo: LVEF 20 to 25%, global hypokinesis, mild MVR, mild AVR, moderate aortic valve thickening, moderate aortic valve calcification LE venous Doppler no DVT LDL 105 HgbA1c 5.5 VTE prophylaxis - lovenox clopidogrel 75 mg daily prior to admission (patient not taking), was on aspirin 81 mg daily and clopidogrel 75 mg daily.  Given cardioembolic stroke with low EF, will recommend DOAC once po access and he will EF more than 30-35%.  Therapy recommendations: SNF Disposition: Pending, SW on board.   Cardiomyopathy Home meds including Imdur, Coreg but patient not taking 2023: EF 45%, saw Dr. Milagros Evener in [facility] This admission EF 20 to 25%. Recommend Cardiology consult due to low EF.  Given cardiovascular, recommend anticoagulation with DOAC once p.o. access until EF more than 30-35%.  Hypertension Home meds:  hydralazine 50mg  Q8H, coreg 3.125 BID, but patient not taking Stable on the high end Long-term BP goal normotensive  Hyperlipidemia Home meds:  Lipitor 40mg  LDL 105, goal < 70 Increase to Lipitor 80mg  once p.o. access Continue statin at discharge  Dysphagia Patient did not pass swallow Speech on board Currently n.p.o. IV fluid  Other Active Problems ?Advanced Dementia Pending memory  care placement per 1/19 ED visit notes No home medications RA Methotrextate home medication  Hospital day # 0   Pt seen by Neuro NP/APP and later by MD. Note/plan to be edited by MD as needed.    Lynnae January, DNP, AGACNP-BC [facility] Please use AMION for contact information & EPIC for messaging.  ATTENDING NOTE: I reviewed above note and agree with the assessment and plan. Pt was  seen and examined.   No family bedside.  Patient lying bed, lethargic but eyes open.  Seems to replied hospital when I asking about his location, however severe dysarthria versus aphasia, not able to understand his speech further.  Not quite follows some commands, not quite cooperative with exam.  Inconsistent blinking to visual threat to the right, but blinking to visual threat on the left.  Right facial droop, seems bilateral upper extremity against gravity symmetrically, bilateral lower extremity withdraw to pain symmetrically.  Patient stroke embolic pattern, EF 20 to 25%, likely cardioembolic source.  Cardiac consult recommended.  Currently n.p.o., recommend to resume statin and start DOAC once p.o. access.  No follow.  PT and OT recommend SNF.  For detailed assessment and plan, please refer to above/below as I have made changes wherever appropriate.   Marvel Plan, MD PhD Stroke Neurology 06/21/2023 7:00 PM  I spent additional 30 inpatient minute face-to-face time with the patient, more than 50% of which was spent in counseling and coordination of care, reviewing test results, images and medication, and discussing the diagnosis, treatment plan and potential prognosis. This patient's care requiresreview of multiple databases, neurological assessment, discussion with family, other specialists and medical decision making of high complexity.   To contact Stroke Continuity provider, please refer to [email or web address]. After hours, contact General Neurology

## 2023-06-21 NOTE — H&P (Addendum)
History and Physical    Patient: Dennis [name] ZOX:[id number] DOB: 1946-05-13 DOA: 06/21/2023 DOS: the patient was seen and examined on 06/21/2023 PCP: Pcp, No  Patient coming from: Home  Chief Complaint:  Chief Complaint  Patient presents with   Code Stroke     LSW : 2230 06/20/23   HPI: Dennis [name] is a 78 y.o. male with medical history significant of HLD, BPH, HTN, dementia, rheumatoid arthritis, COPD, Barrett's esophagus, OSA does not use CPAP, history of CVA. He presents to [facility] ED vis EMS after significant other noted him to be acutely more confused than his baseline and having speech difficulty. EMS noted him to have (L) facial droop and expressive aphasia as well as hypertensive en route to ED. History is obtained entirely from chart review as patient is sedated after Haldol administration at 0656 and has cognitive deficits at baseline. Unable to reach significant other via phone at time of dictation.    ED Course: On arrival to [facility] ED patient was noted to be afebrile temp 36.8C, BP 191/89, HR 84, RR 12, SPO2 98% on room air. Labwork largely unrevealing. CT Head WO obtained and negative for acute intracranial abnormality. MRI with acute infarct int he (L) posterior subinsular white matter and chronic microvascular ischemic diease. He received Ativan for MRI and later Haldol in ED. Neurology Stroke MD evaluated patient in ED and [facility] contacted for admission.    Review of Systems: unable to review all systems due to the inability of the patient to answer questions. Past Medical History:  Diagnosis Date   Arthritis    Hypertension    Past Surgical History:  Procedure Laterality Date   CHOLECYSTECTOMY     THROAT SURGERY     Social History:  reports that he has quit smoking. His smoking use included cigarettes. He has never used smokeless tobacco. He reports that he does not drink alcohol and does not use drugs.  Allergies  Allergen Reactions   Codeine    Shellfish  Allergy     No family history on file.  Prior to Admission medications   Medication Sig Start Date End Date Taking? Authorizing Provider  atorvastatin (LIPITOR) 40 MG tablet Take 1 tablet (40 mg total) by mouth daily. Patient not taking: Reported on 06/21/2023 03/16/23   Maretta Bees, MD  carvedilol (COREG) 3.125 MG tablet Take 1 tablet (3.125 mg total) by mouth 2 (two) times daily with a meal. Patient not taking: Reported on 06/21/2023 03/16/23   Maretta Bees, MD  clopidogrel (PLAVIX) 75 MG tablet Take 1 tablet (75 mg total) by mouth daily. Patient not taking: Reported on 06/21/2023 03/16/23   Maretta Bees, MD  finasteride (PROSCAR) 5 MG tablet Take 1 tablet (5 mg total) by mouth daily. Patient not taking: Reported on 06/21/2023 03/16/23   Maretta Bees, MD  folic acid (FOLVITE) 1 MG tablet Take 1 tablet (1 mg total) by mouth daily. Patient not taking: Reported on 06/21/2023 03/16/23   Maretta Bees, MD  hydrALAZINE (APRESOLINE) 50 MG tablet Take 1 tablet (50 mg total) by mouth every 8 (eight) hours. Patient not taking: Reported on 06/21/2023 03/16/23   Maretta Bees, MD  isosorbide mononitrate (IMDUR) 30 MG 24 hr tablet Take 1 tablet (30 mg total) by mouth daily. Patient not taking: Reported on 06/21/2023 03/16/23   Maretta Bees, MD  methotrexate 50 MG/2ML injection Inject 25 mg into the skin once a week. Patient not taking:  Reported on 06/21/2023    [provider]  omeprazole (PRILOSEC) 20 MG capsule Take 1 capsule (20 mg total) by mouth daily. Patient not taking: Reported on 06/21/2023 03/16/23   Maretta Bees, MD  tamsulosin (FLOMAX) 0.4 MG CAPS capsule Take 1 capsule (0.4 mg total) by mouth daily. Patient not taking: Reported on 06/21/2023 03/16/23   Maretta Bees, MD    Physical Exam: Vitals:   06/21/23 0700 06/21/23 0715 06/21/23 0730 06/21/23 0745  BP: (!) 155/90 139/77 (!) 142/71 (!) 144/86  Pulse: 78 75 68 67  Resp: 18 16 17 17    Temp:      TempSrc:      SpO2: 96% 93% 95% 95%  Weight:      Height:       Constitutional: Calm Eyes: PERRL, lids and conjunctivae normal ENMT: Mucous membranes are moist. Posterior pharynx clear of any exudate or lesions. Neck: normal, supple, no masses, no thyromegaly Respiratory: clear to auscultation bilaterally, no wheezing, no crackles. Normal respiratory effort. No accessory muscle use.  Cardiovascular: Regular rate and rhythm PVCs noted on monitor, no murmurs / rubs / gallops. No extremity edema. 3+ radial pulses, 2+ pedal pulses.  Abdomen: no tenderness, no masses palpated. No hepatosplenomegaly. Bowel sounds positive x 4 quadrants.  Musculoskeletal: no clubbing / cyanosis. No joint deformity upper and lower extremities. No contractures.   Skin: no rashes, lesions, ulcers.  Neurologic: Sedated after Haldol administration at 0656, able to tell me his name and follow simple commands no focal weakness noted.    Data Reviewed: CBC    Component Value Date/Time   WBC 7.9 06/21/2023 0016   RBC 4.64 06/21/2023 0016   HGB 13.6 06/21/2023 0017   HCT 40.0 06/21/2023 0017   PLT 266 06/21/2023 0016   MCV 87.9 06/21/2023 0016   MCH 28.0 06/21/2023 0016   MCHC 31.9 06/21/2023 0016   RDW 15.7 (H) 06/21/2023 0016   LYMPHSABS 2.6 06/21/2023 0016   MONOABS 1.1 (H) 06/21/2023 0016   EOSABS 0.2 06/21/2023 0016   BASOSABS 0.1 06/21/2023 0016   CMP     Component Value Date/Time   NA 140 06/21/2023 0017   K 4.1 06/21/2023 0017   CL 105 06/21/2023 0017   CO2 26 06/21/2023 0016   GLUCOSE 94 06/21/2023 0017   BUN 14 06/21/2023 0017   CREATININE 1.10 06/21/2023 0017   CALCIUM 10.4 (H) 06/21/2023 0016   PROT 6.9 06/21/2023 0016   ALBUMIN 3.3 (L) 06/21/2023 0016   AST 18 06/21/2023 0016   ALT 13 06/21/2023 0016   ALKPHOS 29 (L) 06/21/2023 0016   BILITOT 0.4 06/21/2023 0016   GFRNONAA >60 06/21/2023 0016   Alcohol Level    Component Value Date/Time   ETH <10 06/21/2023 0016    Drugs of Abuse     Component Value Date/Time   LABOPIA NONE DETECTED 06/21/2023 0047   COCAINSCRNUR NONE DETECTED 06/21/2023 0047   LABBENZ NONE DETECTED 06/21/2023 0047   AMPHETMU NONE DETECTED 06/21/2023 0047   THCU NONE DETECTED 06/21/2023 0047   LABBARB NONE DETECTED 06/21/2023 0047    Urinalysis    Component Value Date/Time   COLORURINE STRAW (A) 06/21/2023 0047   APPEARANCEUR CLEAR 06/21/2023 0047   LABSPEC 1.009 06/21/2023 0047   PHURINE 6.0 06/21/2023 0047   GLUCOSEU NEGATIVE 06/21/2023 0047   HGBUR NEGATIVE 06/21/2023 0047   BILIRUBINUR NEGATIVE 06/21/2023 0047   KETONESUR NEGATIVE 06/21/2023 0047   PROTEINUR NEGATIVE 06/21/2023 0047   NITRITE NEGATIVE 06/21/2023  1610   LEUKOCYTESUR NEGATIVE 06/21/2023 0047    Protime-INR    Component Value Date/Time   PROTHROMBIN TIME INR 13.5 1.0 06/21/2023 0016 06/21/2023 0016   APTT    Component Value Date/Time   aPTT 27 06/21/2023 0016   MR BRAIN WO CONTRAST Result Date: 06/21/2023 CLINICAL DATA:  Neuro deficit, acute, stroke suspected EXAM: MRI HEAD WITHOUT CONTRAST TECHNIQUE: Multiplanar, multiecho pulse sequences of the brain and surrounding structures were obtained without intravenous contrast. COMPARISON:  Same day CT head.  MRI head October 6, 24. FINDINGS: Brain: Punctate acute infarct in the left posterior subinsular white matter (series 6, image 57 and series 7, image 20). No evidence of acute hemorrhage, mass lesion, midline shift or hydrocephalus. Moderate T2/FLAIR hyperintensity in the white matter, compatible with chronic microvascular ischemic disease. Remote right thalamic lacunar infarct. Cerebral atrophy. Vascular: Major arterial flow voids are maintained at the skull base. Skull and upper cervical spine: Normal marrow signal. Sinuses/Orbits: Right maxillary sinus retention cyst. Otherwise, clear sinuses. No acute orbital findings. Other: No mastoid effusions. IMPRESSION: 1. Punctate acute infarct in the left  posterior subinsular white matter. 2. Moderate chronic microvascular ischemic disease. Electronically Signed   By: Feliberto Harts M.D.   On: 06/21/2023 03:19   CT HEAD CODE STROKE WO CONTRAST Result Date: 06/21/2023 CLINICAL DATA:  Code stroke.  Acute neurologic deficit EXAM: CT HEAD WITHOUT CONTRAST TECHNIQUE: Contiguous axial images were obtained from the base of the skull through the vertex without intravenous contrast. RADIATION DOSE REDUCTION: This exam was performed according to the departmental dose-optimization program which includes automated exposure control, adjustment of the mA and/or kV according to patient size and/or use of iterative reconstruction technique. COMPARISON:  None Available. FINDINGS: Brain: There is no mass, hemorrhage or extra-axial collection. There is generalized atrophy without lobar predilection. There is hypoattenuation of the periventricular white matter, most commonly indicating chronic ischemic microangiopathy. Vascular: Atherosclerotic calcification of the vertebral and internal carotid arteries at the skull base. No abnormal hyperdensity of the major intracranial arteries or dural venous sinuses. Skull: The visualized skull base, calvarium and extracranial soft tissues are normal. Sinuses/Orbits: No fluid levels or advanced mucosal thickening of the visualized paranasal sinuses. No mastoid or middle ear effusion. The orbits are normal. ASPECTS [facility] Stroke Program Early CT Score) - Ganglionic level infarction (caudate, lentiform nuclei, internal capsule, insula, M1-M3 cortex): 7 - Supraganglionic infarction (M4-M6 cortex): 3 Total score (0-10 with 10 being normal): 10 IMPRESSION: 1. No acute intracranial abnormality. 2. ASPECTS is 10. 3. Chronic ischemic microangiopathy and generalized atrophy. These results were communicated to Dr. Erick Blinks at 12:37 am on 06/21/2023 by text page via the [facility] messaging system. Electronically Signed   By: Deatra Robinson M.D.    On: 06/21/2023 00:37      Assessment and Plan: #Acute Cerebrovascular Accident - Permissive HTN x 48 hrs from sx onset; goal B/P <220/110. PRN hydralazine if BP above these parameters. - ECHO - ASA 81 mg daily  - A1C and LDL - Atorvastatin 40 mg - q4H neuro checks - STAT head CT for any change in neuro exam - Monitor on telemetry - PT/OT/SLP - Stroke education  #Medication Non-compliance Neighbor [name] reports she does not believe patient has been taking any of his outpatient medications.  #Hyperlipidemia - Restart Atorvastatin 40 mg daily  #Hypertension - Hold home antihypertensives to allow for permissive hypertension as above - Gradually resume when outside of Permissive HTN window as patient not taking meds outpatient  #BPH -  Restart Proscar and Flomax  VTE prophylaxis: Lovenox  GI prophylaxis: Protonix  Diet: Heart Healthy Access: PIV Lines: None Code Status: Full HCDM: [employer], Significant Other  Telemetry: Yes Disposition: Observe on Med-Surg  Advance Care Planning:   Code Status: Full Code   Consults: Neurology  Family Communication: Unable to reach Significant Other Licia at [phone], [phone]. Spoke with Delene Loll [phone] and updated her on patient status and plan of care. [name] states she does not believe Mr Vanderhoof has been taking any of his outpatient medications.  Severity of Illness: The appropriate patient status for this patient is INPATIENT. Inpatient status is judged to be reasonable and necessary in order to provide the required intensity of service to ensure the patient's safety. The patient's presenting symptoms, physical exam findings, and initial radiographic and laboratory data in the context of their chronic comorbidities is felt to place them at high risk for further clinical deterioration. Furthermore, it is not anticipated that the patient will be medically stable for discharge from the hospital within 2 midnights  of admission.   * I certify that at the point of admission it is my clinical judgment that the patient will require inpatient hospital care spanning beyond 2 midnights from the point of admission due to high intensity of service, high risk for further deterioration and high frequency of surveillance required.*  To reach the provider On-Call:   7AM- 7PM see care teams to locate the attending and reach out to them via [email or web address] Password: [email or web address] 7PM-7AM contact night-coverage If you still have difficulty reaching the appropriate provider, please page the [facility] (Director on Call) for [facility] on amion for assistance  This document was prepared using Conservation officer, historic buildings and may include unintentional dictation errors.  Bishop Limbo FNP-BC, PMHNP-BC Nurse Practitioner [facility]

## 2023-06-22 ENCOUNTER — Observation Stay (HOSPITAL_COMMUNITY): Payer: 59

## 2023-06-22 DIAGNOSIS — N179 Acute kidney failure, unspecified: Secondary | ICD-10-CM | POA: Diagnosis present

## 2023-06-22 DIAGNOSIS — M069 Rheumatoid arthritis, unspecified: Secondary | ICD-10-CM | POA: Diagnosis present

## 2023-06-22 DIAGNOSIS — I351 Nonrheumatic aortic (valve) insufficiency: Secondary | ICD-10-CM | POA: Diagnosis present

## 2023-06-22 DIAGNOSIS — Z87891 Personal history of nicotine dependence: Secondary | ICD-10-CM | POA: Diagnosis not present

## 2023-06-22 DIAGNOSIS — Z7901 Long term (current) use of anticoagulants: Secondary | ICD-10-CM | POA: Diagnosis not present

## 2023-06-22 DIAGNOSIS — I63131 Cerebral infarction due to embolism of right carotid artery: Secondary | ICD-10-CM | POA: Diagnosis not present

## 2023-06-22 DIAGNOSIS — Z79899 Other long term (current) drug therapy: Secondary | ICD-10-CM | POA: Diagnosis not present

## 2023-06-22 DIAGNOSIS — J449 Chronic obstructive pulmonary disease, unspecified: Secondary | ICD-10-CM | POA: Diagnosis present

## 2023-06-22 DIAGNOSIS — F0394 Unspecified dementia, unspecified severity, with anxiety: Secondary | ICD-10-CM | POA: Diagnosis present

## 2023-06-22 DIAGNOSIS — E860 Dehydration: Secondary | ICD-10-CM | POA: Diagnosis present

## 2023-06-22 DIAGNOSIS — Z751 Person awaiting admission to adequate facility elsewhere: Secondary | ICD-10-CM | POA: Diagnosis not present

## 2023-06-22 DIAGNOSIS — Z7902 Long term (current) use of antithrombotics/antiplatelets: Secondary | ICD-10-CM | POA: Diagnosis not present

## 2023-06-22 DIAGNOSIS — R29810 Facial weakness: Secondary | ICD-10-CM | POA: Diagnosis present

## 2023-06-22 DIAGNOSIS — Z781 Physical restraint status: Secondary | ICD-10-CM | POA: Diagnosis not present

## 2023-06-22 DIAGNOSIS — N400 Enlarged prostate without lower urinary tract symptoms: Secondary | ICD-10-CM | POA: Diagnosis present

## 2023-06-22 DIAGNOSIS — F03918 Unspecified dementia, unspecified severity, with other behavioral disturbance: Secondary | ICD-10-CM | POA: Diagnosis present

## 2023-06-22 DIAGNOSIS — R4701 Aphasia: Secondary | ICD-10-CM | POA: Diagnosis present

## 2023-06-22 DIAGNOSIS — I420 Dilated cardiomyopathy: Secondary | ICD-10-CM | POA: Diagnosis present

## 2023-06-22 DIAGNOSIS — E7849 Other hyperlipidemia: Secondary | ICD-10-CM | POA: Diagnosis not present

## 2023-06-22 DIAGNOSIS — I5021 Acute systolic (congestive) heart failure: Secondary | ICD-10-CM | POA: Diagnosis not present

## 2023-06-22 DIAGNOSIS — I63413 Cerebral infarction due to embolism of bilateral middle cerebral arteries: Secondary | ICD-10-CM | POA: Diagnosis not present

## 2023-06-22 DIAGNOSIS — I130 Hypertensive heart and chronic kidney disease with heart failure and stage 1 through stage 4 chronic kidney disease, or unspecified chronic kidney disease: Secondary | ICD-10-CM | POA: Diagnosis present

## 2023-06-22 DIAGNOSIS — F03911 Unspecified dementia, unspecified severity, with agitation: Secondary | ICD-10-CM | POA: Diagnosis present

## 2023-06-22 DIAGNOSIS — I639 Cerebral infarction, unspecified: Secondary | ICD-10-CM | POA: Diagnosis present

## 2023-06-22 DIAGNOSIS — N1831 Chronic kidney disease, stage 3a: Secondary | ICD-10-CM | POA: Diagnosis present

## 2023-06-22 DIAGNOSIS — I6349 Cerebral infarction due to embolism of other cerebral artery: Secondary | ICD-10-CM | POA: Diagnosis present

## 2023-06-22 DIAGNOSIS — D631 Anemia in chronic kidney disease: Secondary | ICD-10-CM | POA: Diagnosis present

## 2023-06-22 DIAGNOSIS — R451 Restlessness and agitation: Secondary | ICD-10-CM | POA: Diagnosis not present

## 2023-06-22 DIAGNOSIS — I5043 Acute on chronic combined systolic (congestive) and diastolic (congestive) heart failure: Secondary | ICD-10-CM | POA: Diagnosis present

## 2023-06-22 DIAGNOSIS — E785 Hyperlipidemia, unspecified: Secondary | ICD-10-CM | POA: Diagnosis present

## 2023-06-22 LAB — CBC
HCT: 41.2 % (ref 39.0–52.0)
Hemoglobin: 13.4 g/dL (ref 13.0–17.0)
MCH: 27.8 pg (ref 26.0–34.0)
MCHC: 32.5 g/dL (ref 30.0–36.0)
MCV: 85.5 fL (ref 80.0–100.0)
Platelets: 289 10*3/uL (ref 150–400)
RBC: 4.82 MIL/uL (ref 4.22–5.81)
RDW: 15.4 % (ref 11.5–15.5)
WBC: 8.1 10*3/uL (ref 4.0–10.5)
nRBC: 0 % (ref 0.0–0.2)

## 2023-06-22 LAB — BASIC METABOLIC PANEL
Anion gap: 9 (ref 5–15)
BUN: 11 mg/dL (ref 8–23)
CO2: 23 mmol/L (ref 22–32)
Calcium: 9.6 mg/dL (ref 8.9–10.3)
Chloride: 105 mmol/L (ref 98–111)
Creatinine, Ser: 1.31 mg/dL — ABNORMAL HIGH (ref 0.61–1.24)
GFR, Estimated: 56 mL/min — ABNORMAL LOW (ref >60)
Glucose, Bld: 97 mg/dL (ref 70–99)
Potassium: 4 mmol/L (ref 3.5–5.1)
Sodium: 137 mmol/L (ref 135–145)

## 2023-06-22 LAB — HEPARIN LEVEL (UNFRACTIONATED): Heparin Unfractionated: 0.46 [IU]/mL (ref 0.30–0.70)

## 2023-06-22 LAB — APTT: aPTT: 43 s — ABNORMAL HIGH (ref 24–36)

## 2023-06-22 LAB — BRAIN NATRIURETIC PEPTIDE: B Natriuretic Peptide: 946.6 pg/mL — ABNORMAL HIGH (ref 0.0–100.0)

## 2023-06-22 MED ORDER — ATORVASTATIN CALCIUM 80 MG PO TABS
80.0000 mg | ORAL_TABLET | Freq: Every day | ORAL | Status: DC
  Administered 2023-06-23 – 2023-08-14 (×53): 80 mg via ORAL
  Filled 2023-06-22 (×53): qty 1

## 2023-06-22 MED ORDER — IOHEXOL 350 MG/ML SOLN
75.0000 mL | Freq: Once | INTRAVENOUS | Status: AC | PRN
  Administered 2023-06-22: 75 mL via INTRAVENOUS

## 2023-06-22 MED ORDER — HEPARIN (PORCINE) 25000 UT/250ML-% IV SOLN
900.0000 [IU]/h | INTRAVENOUS | Status: DC
  Administered 2023-06-22: 750 [IU]/h via INTRAVENOUS
  Filled 2023-06-22: qty 250

## 2023-06-22 MED ORDER — SODIUM CHLORIDE 0.9 % IV SOLN: INTRAVENOUS | Status: DC

## 2023-06-22 NOTE — Evaluation (Signed)
Occupational Therapy Evaluation Patient Details Name: Dennis [name] MRN: [id number] DOB: 01-02-1946 Today's Date: 06/22/2023   History of Present Illness The pt is a 78 yo male presenting 2/1 from home with concern for stroke (slurred speech and mild L facial droop). MRI showed:  small area of acute infarct in the more superior left frontal cortex and 2 areas of subacute ischemia in superior R frontal lobe. PMH includes: dementia, arthritis, HTN, HLD, RA, COPD, OSA, and CVA.   Clinical Impression   PTA patient reports independent with ADLs, using rollator for mobility but questionable historian.  He was admitted for above and presents with problem list below.  He is oriented to self only (unaware he is at the hospital), follows simple commands with increased time but demonstrates some decreased awareness, problem solving, initiation, sequencing, and attention (although easily redirected).  He has some difficulty communicating, but is able to voice concern for his spouse (Occupational hygienist).  Patient requires min assist for bed mobility, transfers and limited mobility in room (side stepping/forward stepping at EOB) using RW, min to mod assist for Adls.  Based on performance today, believe patient will best benefit from continued OT services acutely and after dc at inpatient setting with <3hrs/day to optimize independence, safety with ADLs and mobility.       If plan is discharge home, recommend the following: A lot of help with walking and/or transfers;A lot of help with bathing/dressing/bathroom;Assistance with cooking/housework;Direct supervision/assist for medications management;Direct supervision/assist for financial management;Help with stairs or ramp for entrance;Assist for transportation;Assistance with feeding;Supervision due to cognitive status    Functional Status Assessment  Patient has had a recent decline in their functional status and demonstrates the ability to make significant improvements  in function in a reasonable and predictable amount of time.  Equipment Recommendations  Other (comment) (defer)    Recommendations for Other Services       Precautions / Restrictions Precautions Precautions: Fall Precaution Comments: watch BP, wrist restraints Restrictions Weight Bearing Restrictions Per Provider Order: No      Mobility Bed Mobility Overal bed mobility: Needs Assistance Bed Mobility: Supine to Sit, Sit to Supine     Supine to sit: Min assist, HOB elevated Sit to supine: Min assist   General bed mobility comments: min assist to initate and fully ascend    Transfers Overall transfer level: Needs assistance Equipment used: Rolling walker (2 wheels), 1 person hand held assist Transfers: Sit to/from Stand Sit to Stand: Min assist           General transfer comment: min assist to power up and steady using RW      Balance Overall balance assessment: Needs assistance Sitting-balance support: No upper extremity supported Sitting balance-Leahy Scale: Fair     Standing balance support: Bilateral upper extremity supported, During functional activity, Reliant on assistive device for balance Standing balance-Leahy Scale: Poor                             ADL either performed or assessed with clinical judgement   ADL Overall ADL's : Needs assistance/impaired     Grooming: Sitting;Minimal assistance           Upper Body Dressing : Minimal assistance;Sitting   Lower Body Dressing: Moderate assistance;Sit to/from stand Lower Body Dressing Details (indicate cue type and reason): able to doff and don socks with increased time, a little assistance for balance sitting but relies on BUE support in standing  Toilet Transfer: Minimal assistance;Ambulation;Rolling walker (2 wheels) Toilet Transfer Details (indicate cue type and reason): simulated at EOB         Functional mobility during ADLs: Minimal assistance;Rolling walker (2 wheels);Cueing  for sequencing;Cueing for safety       Vision   Additional Comments: not formally assessed     Perception         Praxis         Pertinent Vitals/Pain Pain Assessment Pain Assessment: No/denies pain     Extremity/Trunk Assessment Upper Extremity Assessment Upper Extremity Assessment: Generalized weakness;Difficult to assess due to impaired cognition   Lower Extremity Assessment Lower Extremity Assessment: Defer to PT evaluation   Cervical / Trunk Assessment Cervical / Trunk Assessment: Kyphotic   Communication Communication Communication: Difficulty following commands/understanding;Difficulty communicating thoughts/reduced clarity of speech;Hearing impairment Following commands: Follows one step commands inconsistently;Follows one step commands with increased time Cueing Techniques: Verbal cues;Tactile cues;Visual cues;Gestural cues   Cognition Arousal: Lethargic Behavior During Therapy: Flat affect Overall Cognitive Status: Difficult to assess                                 General Comments: pt oriented to self, follows simple commands with increased time.  He has difficulty commuincating at times, is unaware of situation or place but is able to commuincate he is concerned about his spouse. Functionally requires more cueing for initation and sequencing.     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      Prior Functioning/Environment Prior Level of Function : Patient poor historian/Family not available;Independent/Modified Independent              Mobility Comments: he reports using RW ADLs Comments: he reports independent        OT Problem List: Decreased strength;Impaired balance (sitting and/or standing);Decreased activity tolerance;Decreased knowledge of precautions;Decreased knowledge of use of DME or AE;Decreased safety awareness;Decreased cognition      OT Treatment/Interventions: Self-care/ADL training;Therapeutic exercise;DME and/or AE instruction;Therapeutic activities;Cognitive remediation/compensation;Balance training;Patient/family education    OT Goals(Current goals can be found in the care plan section) Acute Rehab OT Goals Patient Stated Goal: none stated OT Goal Formulation: Patient unable to participate in goal setting Time For Goal Achievement: 07/06/23 Potential to Achieve Goals: Good  OT Frequency: Min 1X/week    Co-evaluation              AM-PAC OT "6 Clicks" Daily Activity     Outcome Measure Help from another person eating meals?: Total (NPO) Help from another person taking care of personal grooming?: A Little Help from another person toileting, which includes using toliet, bedpan, or urinal?: A Lot Help from another person bathing (including washing, rinsing, drying)?: A Lot Help from another person to put on and taking off regular upper body clothing?: A Little Help from another person to put on and taking off regular lower body clothing?: A Lot 6 Click  Score: 13   End of Session Equipment Utilized During Treatment: Gait belt;Rolling walker (2 wheels) Nurse Communication: Mobility status;Precautions  Activity Tolerance: Patient tolerated treatment well Patient left: in bed;with call bell/phone within reach;with bed alarm set;with restraints reapplied  OT Visit Diagnosis: Other abnormalities of gait and mobility (R26.89);Muscle weakness (generalized) (M62.81);Other symptoms and signs involving cognitive function                Time: 6962-9528 OT Time Calculation (min): 22  min Charges:  OT General Charges $OT Visit: 1 Visit OT Evaluation $OT Eval Moderate Complexity: 1 Mod  Barry Brunner, OT Acute Rehabilitation Services Office [phone]   Chancy Milroy 06/22/2023, 10:24 AM

## 2023-06-22 NOTE — Progress Notes (Addendum)
STROKE TEAM PROGRESS NOTE   BRIEF HPI [name] is a 78 y.o. male with PMH significant for HTN, probable advanced dementia, arthritis who was BIB EMS as a CODE STROKE due to confusion. On neurology assessment, patient was confused with some aphasia. CTH negative NIH on Admission: 4  MRI shows Punctate acute infarct in the left posterior subinsular white matter. Admitted for stroke workup.   INTERIM HISTORY/SUBJECTIVE  No acute events overnight. Continues to require restraints due to restlessness/agitation.   Heparin IV ordered due to embolic stroke.   Pending Cardiology consult. Pending Modified Barium Swallow Test.   OBJECTIVE  CBC    Component Value Date/Time   WBC 8.1 06/22/2023 1251   RBC 4.82 06/22/2023 1251   HGB 13.4 06/22/2023 1251   HCT 41.2 06/22/2023 1251   PLT 289 06/22/2023 1251   MCV 85.5 06/22/2023 1251   MCH 27.8 06/22/2023 1251   MCHC 32.5 06/22/2023 1251   RDW 15.4 06/22/2023 1251   LYMPHSABS 2.6 06/21/2023 0016   MONOABS 1.1 (H) 06/21/2023 0016   EOSABS 0.2 06/21/2023 0016   BASOSABS 0.1 06/21/2023 0016    BMET    Component Value Date/Time   NA 137 06/22/2023 1251   K 4.0 06/22/2023 1251   CL 105 06/22/2023 1251   CO2 23 06/22/2023 1251   GLUCOSE 97 06/22/2023 1251   BUN 11 06/22/2023 1251   CREATININE 1.31 (H) 06/22/2023 1251   CALCIUM 9.6 06/22/2023 1251   GFRNONAA 56 (L) 06/22/2023 1251    IMAGING past 24 hours DG CHEST PORT 1 VIEW Result Date: 06/22/2023 CLINICAL DATA:  Congestive heart failure EXAM: PORTABLE CHEST 1 VIEW COMPARISON:  03/04/2023 FINDINGS: Numerous leads and wires project over the chest. Apical lordotic positioning. Midline trachea. Borderline cardiomegaly. Atherosclerosis in the transverse aorta. No pleural fluid. A density difference over the upper left hemithorax is favored to represent a skin fold. There may be minimal left lower lobe scarring laterally. IMPRESSION: Mild cardiomegaly, without congestive failure.  Aortic Atherosclerosis (ICD10-I70.0). Electronically Signed   By: Jeronimo Greaves M.D.   On: 06/22/2023 11:15   CT ANGIO HEAD NECK W WO CM Result Date: 06/22/2023 CLINICAL DATA:  Stroke/TIA, determine embolic source EXAM: CT ANGIOGRAPHY HEAD AND NECK WITH AND WITHOUT CONTRAST TECHNIQUE: Multidetector CT imaging of the head and neck was performed using the standard protocol during bolus administration of intravenous contrast. Multiplanar CT image reconstructions and MIPs were obtained to evaluate the vascular anatomy. Carotid stenosis measurements (when applicable) are obtained utilizing NASCET criteria, using the distal internal carotid diameter as the denominator. RADIATION DOSE REDUCTION: This exam was performed according to the departmental dose-optimization program which includes automated exposure control, adjustment of the mA and/or kV according to patient size and/or use of iterative reconstruction technique. CONTRAST:  75mL OMNIPAQUE IOHEXOL 350 MG/ML SOLN COMPARISON:  CT head October 5, 24. FINDINGS: CT HEAD FINDINGS Brain: No evidence of acute infarction, hemorrhage, hydrocephalus, extra-axial collection or mass lesion/mass effect. Similar patchy white matter hypodensities, compatible with chronic microvascular ischemic disease. Similar remote infarct in the right thalamic and left caudate lacunar infarcts. Vascular: See below. Skull: No acute fracture. Sinuses/Orbits: No acute abnormality. Review of the MIP images confirms the above findings CTA NECK FINDINGS Aortic arch: Calcific atherosclerosis of the aorta. Great vessel origins are patent. Right carotid system: Calcific atherosclerosis of the carotid bifurcation involving the proximal ICA with approximately 60% stenosis of the right proximal ICA. Left carotid system: Atherosclerosis at the carotid bifurcation and involving  the proximal ICA with approximately 50% stenosis of the proximal ICA. Vertebral arteries: Left dominant. Severe right vertebral  artery origin stenosis. Left vertebral artery is patent without significant stenosis. Skeleton: No acute abnormality.  Multilevel degenerative change. Other neck: No acute abnormality limits assessment. Upper chest: Visualized lung apices are clear. Review of the MIP images confirms the above findings CTA HEAD FINDINGS Anterior circulation: Bilateral intracranial ICAs, MCAs, and ACAs are patent without proximal 8 imminently significant stenosis. Right ACA is hypoplastic. Posterior circulation: Bilateral intradural vertebral arteries, basilar artery and bilateral posterior cerebral arteries are patent without proximal hemodynamically significant stenosis. Venous sinuses: As permitted by contrast timing, patent. Review of the MIP images confirms the above findings IMPRESSION: CT HEAD: No evidence of acute intracranial abnormality. CTA: 1. No emergent large vessel occlusion. 2. 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 3. Approximately 50% stenosis of the left proximal ICA. 4. Severe right vertebral artery origin stenosis. 5. Aortic Atherosclerosis (ICD10-I70.0). Electronically Signed   By: Feliberto Harts M.D.   On: 06/22/2023 04:00    Vitals:   06/21/23 2311 06/22/23 0404 06/22/23 0733 06/22/23 1213  BP: (!) 159/87 (!) 174/97 (!) 158/85 (!) 163/101  Pulse: 98 93 85 100  Resp: 18  16   Temp: 98.3 F (36.8 C) 98.6 F (37 C) (!) 97.3 F (36.3 C) 97.7 F (36.5 C)  TempSrc: Oral Axillary Oral Axillary  SpO2: 95% 94% 94% 93%  Weight:      Height:         PHYSICAL EXAM General:  Lethargic, well-nourished, well-developed patient in no acute distress. Restless. Requiring Restraints.  CV: Regular rate and rhythm on monitor Respiratory:  Regular, unlabored respirations on room air GI: Abdomen soft and nontender   NEURO:  Mental Status: lethargic, knows he is in the hospital unable to answer further orientation questions.  Speech/Language: severely dysarthric with expressive aphasia.    Cranial Nerves:  II: PERRL. Inconsistent blink to threat on right.  III, IV, VI: EOMI. Eyelids elevate symmetrically.  V: Sensation is intact to light touch and symmetrical to face.  VII: Right facial droop VIII: hearing intact to voice. IX, X: Palate elevates symmetrically. Phonation is normal.  UE:[id number], unable to assess XII: tongue is midline without fasciculations. Motor:  Spontaneous movement seen throughout. Does not move on command.  Tone: is normal and bulk is normal Sensation- withdraws throughout.  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  MRI  Punctate acute infarct in the left posterior subinsular white matter.  Left frontal linear cortical infarct, subacute right frontal punctate infarct. 2D Echo: LVEF 20 to 25%, global hypokinesis, mild MVR, mild AVR, moderate aortic valve thickening, moderate aortic valve calcification LE venous Doppler no DVT LDL 105 HgbA1c 5.5 VTE prophylaxis - lovenox clopidogrel 75 mg daily prior to admission (patient not taking), was on aspirin 81 mg daily and clopidogrel 75 mg daily.  Given cardioembolic stroke with low EF, will start Heparin IV and recommend DOAC once po access and until EF more than 30-35%.  Therapy recommendations: SNF Disposition: Pending, SW on board.   Cardiomyopathy Home meds including Imdur, Coreg but patient not taking 2023: EF 45%, saw Dr. Milagros Evener in [facility] This admission EF 20 to 25%. Cardiology  on board - OK to start GDMT meds pt is out of window for permissive hypertension Given cardiovascular results, started Heparin IV, recommend anticoagulation with DOAC once p.o. access until EF more than  30-35%.  Hypertension Home meds:  hydralazine 50mg  Q8H, coreg 3.125 BID, but patient not taking Stable on the high end Long-term BP goal normotensive  Hyperlipidemia Home meds:  Lipitor 40mg , but patient not taking home meds reliably LDL 105, goal < 70 Increase to Lipitor 80mg  once p.o. access Continue statin at discharge  Dysphagia Speech on board Now on dys 3 and honey thick liquid On gentle IV fluid  AKI Cre 1.05->1.31 Likely due to dehydration with dysphagia Put on gentle hydration Monitoring BMP  Other Active Problems ?Advanced Dementia Pending memory care placement per 1/19 ED visit notes No home medications RA Methotrextate home medication  Hospital day # 0   Pt seen by Neuro NP/APP and later by MD. Note/plan to be edited by MD as needed.    Lynnae January, DNP, AGACNP-BC [facility] Please use AMION for contact information & EPIC for messaging.  ATTENDING NOTE: I reviewed above note and agree with the assessment and plan. Pt was seen and examined.   No family at bedside.  Patient lying bed, neuro unchanged, still has severe dysarthria, not follow most simple commands and right facial droop.  Put on heparin IV for cardioembolic stroke with low EF.  Cardiology on board, okay to consider GDMT therapy.  AKI today likely due to dehydration from dysphagia.  Put on gentle hydration.  Speech on board, started on dysphagia 3 and honey thick liquid.  Will follow.  For detailed assessment and plan, please refer to above/below as I have made changes wherever appropriate.   Marvel Plan, MD PhD Stroke Neurology 06/22/2023 3:42 PM  I spent extensive face-to-face time with the patient, more than 50% of which was spent in counseling and coordination of care, reviewing test results, images and medication, and discussing the diagnosis, treatment plan and potential prognosis. This patient's care requiresreview of multiple databases, neurological assessment, discussion  with family, other specialists and medical decision making of high complexity.  I discussed with Dr. Sharolyn Douglas

## 2023-06-22 NOTE — Consult Note (Signed)
Cardiology Consultation   Patient ID: Dennis [name] MRN: [id number]; DOB: 08/08/1945  Admit date: 06/21/2023 Date of Consult: 06/22/2023  PCP:  Dennis [name], No   [facility] Providers Cardiologist:  None        History of Present Illness:   Dennis [name] is a 78 y.o. 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 EMS noted him to have left facial droop and expressive aphasia as well as being hypertensive en route to ED. Brain MRI revealed left frontal linear and left insular punctate acute infarcts, and right frontal punctate subacute infarct, concerning for cardioembolic etiology.  Echocardiogram was performed which showed new drop in LVEF to 20 to 25%, for which cardiology has been consulted.  Patient remains confused and aphasic and unable to provide additional history.   Past Medical History:  Diagnosis Date   Arthritis    Hypertension     Past Surgical History:  Procedure Laterality Date   CHOLECYSTECTOMY     THROAT SURGERY      Inpatient Medications: Scheduled Meds:  atorvastatin  40 mg Oral Daily   finasteride  5 mg Oral Daily   pantoprazole  40 mg Oral Daily   tamsulosin  0.4 mg Oral Daily   Continuous Infusions:  heparin     PRN Meds: acetaminophen **OR** acetaminophen (TYLENOL) oral liquid 160 mg/5 mL **OR** acetaminophen, albuterol, hydrALAZINE, melatonin, senna-docusate  Allergies:    Allergies  Allergen Reactions   Codeine    Shellfish Allergy     Social History:   Social History   Socioeconomic History   Marital status: Single    Spouse name: Not on file   Number of children: Not on file   Years of education: Not on file   Highest education level: Not on file  Occupational  History   Not on file  Tobacco Use   Smoking status: Former    Types: Cigarettes   Smokeless tobacco: Never  Vaping Use   Vaping status: Never Used  Substance and Sexual Activity   Alcohol use: No   Drug use: No   Sexual activity: Not on file  Other Topics Concern   Not on file  Social History Narrative   Not on file   Social Drivers of Health   Financial Resource Strain: Not on file  Food Insecurity: No Food Insecurity (06/21/2023)   Hunger Vital Sign    Worried About Running Out of Food in the Last Year: Never true    Ran Out of Food in the Last Year: Never true  Transportation Needs: No Transportation Needs (06/21/2023)   PRAPARE - Administrator, Civil Service (Medical): No    Lack of Transportation (Non-Medical): No  Physical Activity: Not on file  Stress: Not on file  Social Connections: Moderately Isolated (06/21/2023)   Social Connection and Isolation Panel [NHANES]    Frequency of Communication with Friends and Family: More than three times a week    Frequency of Social Gatherings with Friends and Family: More than three times a week    Attends Religious Services: More than 4 times per year    Active Member of [employer] or Organizations: No    Attends Banker Meetings: Never  Marital Status: Divorced  Catering manager Violence: Not At Risk (06/21/2023)   Humiliation, Afraid, Rape, and Kick questionnaire    Fear of Current or Ex-Partner: No    Emotionally Abused: No    Physically Abused: No    Sexually Abused: No    Family History:   History reviewed. No pertinent family history.   ROS:  Please see the history of present illness.   All other ROS reviewed and negative.     Physical Exam/Data:   Vitals:   06/21/23 2311 06/22/23 0404 06/22/23 0733 06/22/23 1213  BP: (!) 159/87 (!) 174/97 (!) 158/85 (!) 163/101  Pulse: 98 93 85 100  Resp: 18  16   Temp: 98.3 F (36.8 C) 98.6 F (37 C) (!) 97.3 F (36.3 C) 97.7 F (36.5 C)  TempSrc:  Oral Axillary Oral Axillary  SpO2: 95% 94% 94% 93%  Weight:      Height:        Intake/Output Summary (Last 24 hours) at 06/22/2023 1434 Last data filed at 06/22/2023 0739 Gross per 24 hour  Intake 480 ml  Output 850 ml  Net -370 ml      06/21/2023   12:00 AM 06/07/2023    6:36 AM 02/22/2023    1:23 PM  Last 3 Weights  Weight (lbs) 132 lb 4.4 oz 154 lb 5.2 oz 149 lb 14.6 oz  Weight (kg) 60 kg 70 kg 68 kg     Body mass index is 22.71 kg/m.  General: Chronically ill-appearing, uncomfortable HEENT: normal Neck: no JVD Cardiac: Tachycardic and regular Lungs: Normal work of breathing Ext: no edema Skin: warm and dry  Neuro: Aphasic  EKG:  The EKG was personally reviewed and demonstrates: Sinus rhythm Telemetry:  Telemetry was personally reviewed and demonstrates: Sinus rhythm/tachycardia  Relevant CV Studies: [facility] 08/03/21: Dominance: codominant   RCA:  Ostial RCA: Small caliber no significant disease  Proximal RCA: Small caliber no significant disease  Mid RCA: Small caliber no significant disease  Distal RCA: Small caliber no significant disease  RCA continuation: Very small caliber  PDA: Very small  PL branch 1: small caliber   LMCA:  Very short, LAD and LCx essentially each have their own lumens; no  significant CAD   LAD:  Ostial LAD: Large caliber with no significant disease  Proximal LAD: Large caliber with no significant disease  Mid LAD: Moderate to large caliber with mild luminal irregularities  Distal LAD: Moderate caliber with no significant disease  Diag 1: moderate caliber branching vessel with mild luminal irregularities  Diag 2: Small caliber  Diag 3: Very small caliber  Diag 4: Very small   Ramus: none   LCX:  Ostial LCx: Large caliber with no significant disease  Proximal LCx: Large caliber with focal stenosis up to 50%  Mid LCx: moderate to large caliber with mild luminal irregularities  Distal LCx: Small caliber wrapping around AV groove   OM1: Very small  OM2: Large caliber branching vessel with large distribution with mild  luminal irregularities  Left PDA: Small caliber   Findings:  1-vessel CAD. 50-60% proximal LCx stenosis, unchanged compared to report  of coronary angiogram dated 2012. Otherwise minimal obstructive CAD.   Laboratory Data:  High Sensitivity Troponin:  No results for input(s): "TROPONINIHS" in the last 720 hours.   Chemistry Recent Labs  Lab 06/21/23 0016 06/21/23 0017 06/21/23 0837 06/22/23 1251  NA 140 140 138 137  K 4.0 4.1 4.3 4.0  CL 105 105 103  105  CO2 26  --  22 23  GLUCOSE 100* 94 98 97  BUN 13 14 12 11   CREATININE 1.05 1.10 1.05 1.31*  CALCIUM 10.4*  --  10.0 9.6  MG  --   --  1.9  --   GFRNONAA >60  --  >60 56*  ANIONGAP 9  --  13 9    Recent Labs  Lab 06/21/23 0016  PROT 6.9  ALBUMIN 3.3*  AST 18  ALT 13  ALKPHOS 29*  BILITOT 0.4   Lipids  Recent Labs  Lab 06/21/23 0837  CHOL 159  TRIG 78  HDL 31*  LDLCALC 112*  CHOLHDL 5.1    Hematology Recent Labs  Lab 06/21/23 0016 06/21/23 0017 06/21/23 0837 06/22/23 1251  WBC 7.9  --  7.6 8.1  RBC 4.64  --  4.66 4.82  HGB 13.0 13.6 12.9* 13.4  HCT 40.8 40.0 40.9 41.2  MCV 87.9  --  87.8 85.5  MCH 28.0  --  27.7 27.8  MCHC 31.9  --  31.5 32.5  RDW 15.7*  --  15.7* 15.4  PLT 266  --  266 289   Thyroid No results for input(s): "TSH", "FREET4" in the last 168 hours.  BNPNo results for input(s): "BNP", "PROBNP" in the last 168 hours.  DDimer No results for input(s): "DDIMER" in the last 168 hours.   Radiology/Studies:  DG CHEST PORT 1 VIEW Result Date: 06/22/2023 CLINICAL DATA:  Congestive heart failure EXAM: PORTABLE CHEST 1 VIEW COMPARISON:  03/04/2023 FINDINGS: Numerous leads and wires project over the chest. Apical lordotic positioning. Midline trachea. Borderline cardiomegaly. Atherosclerosis in the transverse aorta. No pleural fluid. A density difference over the upper left hemithorax is favored to  represent a skin fold. There may be minimal left lower lobe scarring laterally. IMPRESSION: Mild cardiomegaly, without congestive failure. Aortic Atherosclerosis (ICD10-I70.0). Electronically Signed   By: Jeronimo Greaves M.D.   On: 06/22/2023 11:15   CT ANGIO HEAD NECK W WO CM Result Date: 06/22/2023 CLINICAL DATA:  Stroke/TIA, determine embolic source EXAM: CT ANGIOGRAPHY HEAD AND NECK WITH AND WITHOUT CONTRAST TECHNIQUE: Multidetector CT imaging of the head and neck was performed using the standard protocol during bolus administration of intravenous contrast. Multiplanar CT image reconstructions and MIPs were obtained to evaluate the vascular anatomy. Carotid stenosis measurements (when applicable) are obtained utilizing NASCET criteria, using the distal internal carotid diameter as the denominator. RADIATION DOSE REDUCTION: This exam was performed according to the departmental dose-optimization program which includes automated exposure control, adjustment of the mA and/or kV according to patient size and/or use of iterative reconstruction technique. CONTRAST:  75mL OMNIPAQUE IOHEXOL 350 MG/ML SOLN COMPARISON:  CT head October 5, 24. FINDINGS: CT HEAD FINDINGS Brain: No evidence of acute infarction, hemorrhage, hydrocephalus, extra-axial collection or mass lesion/mass effect. Similar patchy white matter hypodensities, compatible with chronic microvascular ischemic disease. Similar remote infarct in the right thalamic and left caudate lacunar infarcts. Vascular: See below. Skull: No acute fracture. Sinuses/Orbits: No acute abnormality. Review of the MIP images confirms the above findings CTA NECK FINDINGS Aortic arch: Calcific atherosclerosis of the aorta. Great vessel origins are patent. Right carotid system: Calcific atherosclerosis of the carotid bifurcation involving the proximal ICA with approximately 60% stenosis of the right proximal ICA. Left carotid system: Atherosclerosis at the carotid bifurcation and  involving the proximal ICA with approximately 50% stenosis of the proximal ICA. Vertebral arteries: Left dominant. Severe right vertebral artery origin stenosis. Left vertebral artery is  patent without significant stenosis. Skeleton: No acute abnormality.  Multilevel degenerative change. Other neck: No acute abnormality limits assessment. Upper chest: Visualized lung apices are clear. Review of the MIP images confirms the above findings CTA HEAD FINDINGS Anterior circulation: Bilateral intracranial ICAs, MCAs, and ACAs are patent without proximal 8 imminently significant stenosis. Right ACA is hypoplastic. Posterior circulation: Bilateral intradural vertebral arteries, basilar artery and bilateral posterior cerebral arteries are patent without proximal hemodynamically significant stenosis. Venous sinuses: As permitted by contrast timing, patent. Review of the MIP images confirms the above findings IMPRESSION: CT HEAD: No evidence of acute intracranial abnormality. CTA: 1. No emergent large vessel occlusion. 2. 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 3. Approximately 50% stenosis of the left proximal ICA. 4. Severe right vertebral artery origin stenosis. 5. Aortic Atherosclerosis (ICD10-I70.0). Electronically Signed   By: Feliberto Harts M.D.   On: 06/22/2023 04:00   VAS [place] LOWER EXTREMITY VENOUS (DVT) Result Date: 06/21/2023  Lower Venous DVT Study Patient Name:  LEQUAN DOBRATZ Batson  Date of Exam:   06/21/2023 Medical Rec #: [id number]      Accession #:    [id number] Date of Birth: [birth year]      Patient Gender: M Patient Age:   1 years Exam Location:  [facility] Procedure:      VAS [place] LOWER EXTREMITY VENOUS (DVT) Referring Phys: Scheryl Marten XU --------------------------------------------------------------------------------  Indications: Stroke.  Limitations: Somnolent with slow respirations. Comparison Study: No prior study on file Performing Technologist: Sherren Kerns RVS  Examination Guidelines:  A complete evaluation includes B-mode imaging, spectral Doppler, color Doppler, and power Doppler as needed of all accessible portions of each vessel. Bilateral testing is considered an integral part of a complete examination. Limited examinations for reoccurring indications may be performed as noted. The reflux portion of the exam is performed with the patient in reverse Trendelenburg.  +---------+---------------+---------+-----------+----------+--------------+ RIGHT    CompressibilityPhasicitySpontaneityPropertiesThrombus Aging +---------+---------------+---------+-----------+----------+--------------+ CFV      Full           Yes      Yes                                 +---------+---------------+---------+-----------+----------+--------------+ SFJ      Full                                                        +---------+---------------+---------+-----------+----------+--------------+ FV Prox  Full                                                        +---------+---------------+---------+-----------+----------+--------------+ FV Mid   Full                                                        +---------+---------------+---------+-----------+----------+--------------+ FV DistalFull                                                        +---------+---------------+---------+-----------+----------+--------------+  PFV      Full                                                        +---------+---------------+---------+-----------+----------+--------------+ POP      Full           No       Yes                                 +---------+---------------+---------+-----------+----------+--------------+ PTV      Full                                                        +---------+---------------+---------+-----------+----------+--------------+ PERO     Full                                                         +---------+---------------+---------+-----------+----------+--------------+   +---------+---------------+---------+-----------+----------+--------------+ LEFT     CompressibilityPhasicitySpontaneityPropertiesThrombus Aging +---------+---------------+---------+-----------+----------+--------------+ CFV      Full           Yes      Yes                                 +---------+---------------+---------+-----------+----------+--------------+ SFJ      Full                                                        +---------+---------------+---------+-----------+----------+--------------+ FV Prox  Full                                                        +---------+---------------+---------+-----------+----------+--------------+ FV Mid   Full                                                        +---------+---------------+---------+-----------+----------+--------------+ FV DistalFull           Yes      Yes                                 +---------+---------------+---------+-----------+----------+--------------+ PFV      Full                                                        +---------+---------------+---------+-----------+----------+--------------+  POP      Full           No       Yes                                 +---------+---------------+---------+-----------+----------+--------------+ PTV      Full                                                        +---------+---------------+---------+-----------+----------+--------------+ PERO     Full                                                        +---------+---------------+---------+-----------+----------+--------------+     Summary: BILATERAL: - No evidence of deep vein thrombosis seen in the lower extremities, bilaterally. -No evidence of popliteal cyst, bilaterally.   *See table(s) above for measurements and observations. Electronically signed by Lemar Livings MD on 06/21/2023 at 4:02:54 PM.     Final    ECHOCARDIOGRAM COMPLETE Result Date: 06/21/2023    ECHOCARDIOGRAM REPORT   Patient Name:   Dennis [name] Date of Exam: 06/21/2023 Medical Rec #:  [id number]     Height:       64.0 in Accession #:    [id number]    Weight:       132.3 lb Date of Birth:  [birth year]     BSA:          1.641 m Patient Age:    77 years      BP:           139/75 mmHg Patient Gender: M             HR:           76 bpm. Exam Location:  Inpatient Procedure: 2D Echo, Cardiac Doppler, Color Doppler and Intracardiac            Opacification Agent Indications:    Stroke  History:        Patient has prior history of Echocardiogram examinations, most                 recent 02/24/2023. Risk Factors:Hypertension.  Sonographer:    Darlys Gales Referring Phys: [id number] KATY L FOUST IMPRESSIONS  1. No evidence of LV thrombus. Left ventricular ejection fraction, by estimation, is 20 to 25%. The left ventricle has severely decreased function. The left ventricle demonstrates global hypokinesis. Left ventricular diastolic parameters are consistent with Grade I diastolic dysfunction (impaired relaxation).  2. Right ventricular systolic function is normal. The right ventricular size is normal. Tricuspid regurgitation signal is inadequate for assessing PA pressure.  3. The mitral valve is normal in structure. Mild mitral valve regurgitation. No evidence of mitral stenosis.  4. The aortic valve has an indeterminant number of cusps. There is moderate calcification of the aortic valve. There is moderate thickening of the aortic valve. Aortic valve regurgitation is a mild eccentric jet directed towards the anterior mitral valve leaflet. Severity is likely underestimated. No aortic stenosis is present.  5. The inferior vena cava is normal in size with greater than 50%  respiratory variability, suggesting right atrial pressure of 3 mmHg. Comparison(s): Changes from prior study are noted. LVEF worsened from 45-50% to 20-25% now. Mild eccentric AI but severity  likely under-estimated. FINDINGS  Left Ventricle: No evidence of LV thrombus. Left ventricular ejection fraction, by estimation, is 20 to 25%. The left ventricle has severely decreased function. The left ventricle demonstrates global hypokinesis. The left ventricular internal cavity size was normal in size. There is no left ventricular hypertrophy. Left ventricular diastolic parameters are consistent with Grade I diastolic dysfunction (impaired relaxation). Right Ventricle: The right ventricular size is normal. No increase in right ventricular wall thickness. Right ventricular systolic function is normal. Tricuspid regurgitation signal is inadequate for assessing PA pressure. Left Atrium: Left atrial size was normal in size. Right Atrium: Right atrial size was normal in size. Pericardium: There is no evidence of pericardial effusion. Mitral Valve: The mitral valve is normal in structure. Mild mitral valve regurgitation. No evidence of mitral valve stenosis. Tricuspid Valve: The tricuspid valve is normal in structure. Tricuspid valve regurgitation is not demonstrated. No evidence of tricuspid stenosis. Aortic Valve: The aortic valve has an indeterminant number of cusps. There is moderate calcification of the aortic valve. There is moderate thickening of the aortic valve. Aortic valve regurgitation is mild. No aortic stenosis is present. Aortic valve mean gradient measures 4.0 mmHg. Aortic valve peak gradient measures 7.4 mmHg. Aortic valve area, by VTI measures 2.52 cm. Pulmonic Valve: The pulmonic valve was not assessed. Pulmonic valve regurgitation not assessed. Aorta: The aortic root is normal in size and structure. Venous: The inferior vena cava is normal in size with greater than 50% respiratory variability, suggesting right atrial pressure of 3 mmHg. IAS/Shunts: No atrial level shunt detected by color flow Doppler.  LEFT VENTRICLE PLAX 2D LVIDd:         5.30 cm   Diastology LVIDs:         4.80 cm   LV e'  medial:    3.59 cm/s LV PW:         1.10 cm   LV E/e' medial:  9.1 LV IVS:        1.00 cm   LV e' lateral:   4.24 cm/s LVOT diam:     2.00 cm   LV E/e' lateral: 7.7 LV SV:         63 LV SV Index:   38 LVOT Area:     3.14 cm  RIGHT VENTRICLE RV S prime:     14.70 cm/s TAPSE (M-mode): 1.9 cm LEFT ATRIUM           Index        RIGHT ATRIUM           Index LA Vol (A2C): 42.1 ml 25.65 ml/m  RA Area:     11.00 cm LA Vol (A4C): 37.1 ml 22.61 ml/m  RA Volume:   22.00 ml  13.41 ml/m  AORTIC VALVE AV Area (Vmax):    2.08 cm AV Area (Vmean):   2.19 cm AV Area (VTI):     2.52 cm AV Vmax:           136.00 cm/s AV Vmean:          98.900 cm/s AV VTI:            0.249 m AV Peak Grad:      7.4 mmHg AV Mean Grad:      4.0 mmHg LVOT Vmax:  90.20 cm/s LVOT Vmean:        68.900 cm/s LVOT VTI:          0.200 m LVOT/AV VTI ratio: 0.80 MITRAL VALVE MV Area (PHT): 2.69 cm    SHUNTS MV Decel Time: 282 msec    Systemic VTI:  0.20 m MV E velocity: 32.50 cm/s  Systemic Diam: 2.00 cm MV A velocity: 60.30 cm/s MV E/A ratio:  0.54 Vishnu Priya Mallipeddi Electronically signed by Winfield Rast Mallipeddi Signature Date/Time: 06/21/2023/1:33:06 PM    Final    MR BRAIN WO CONTRAST Addendum Date: 06/21/2023 ADDENDUM REPORT: 06/21/2023 10:52 ADDENDUM: Case discussed with Dr. Roda Shutters, agree with an additional small area of acute infarct in the more superior left frontal cortex. There also 2 punctate areas of diffusion hyperintensity without ADC correlate in the superior right frontal lobe which may be areas of subacute ischemia. Electronically Signed   By: Tiburcio Pea M.D.   On: 06/21/2023 10:52   Result Date: 06/21/2023 CLINICAL DATA:  Neuro deficit, acute, stroke suspected EXAM: MRI HEAD WITHOUT CONTRAST TECHNIQUE: Multiplanar, multiecho pulse sequences of the brain and surrounding structures were obtained without intravenous contrast. COMPARISON:  Same day CT head.  MRI head October 6, 24. FINDINGS: Brain: Punctate acute infarct in the  left posterior subinsular white matter (series 6, image 57 and series 7, image 20). No evidence of acute hemorrhage, mass lesion, midline shift or hydrocephalus. Moderate T2/FLAIR hyperintensity in the white matter, compatible with chronic microvascular ischemic disease. Remote right thalamic lacunar infarct. Cerebral atrophy. Vascular: Major arterial flow voids are maintained at the skull base. Skull and upper cervical spine: Normal marrow signal. Sinuses/Orbits: Right maxillary sinus retention cyst. Otherwise, clear sinuses. No acute orbital findings. Other: No mastoid effusions. IMPRESSION: 1. Punctate acute infarct in the left posterior subinsular white matter. 2. Moderate chronic microvascular ischemic disease. Electronically Signed: By: Feliberto Harts M.D. On: 06/21/2023 03:19   CT HEAD CODE STROKE WO CONTRAST Result Date: 06/21/2023 CLINICAL DATA:  Code stroke.  Acute neurologic deficit EXAM: CT HEAD WITHOUT CONTRAST TECHNIQUE: Contiguous axial images were obtained from the base of the skull through the vertex without intravenous contrast. RADIATION DOSE REDUCTION: This exam was performed according to the departmental dose-optimization program which includes automated exposure control, adjustment of the mA and/or kV according to patient size and/or use of iterative reconstruction technique. COMPARISON:  None Available. FINDINGS: Brain: There is no mass, hemorrhage or extra-axial collection. There is generalized atrophy without lobar predilection. There is hypoattenuation of the periventricular white matter, most commonly indicating chronic ischemic microangiopathy. Vascular: Atherosclerotic calcification of the vertebral and internal carotid arteries at the skull base. No abnormal hyperdensity of the major intracranial arteries or dural venous sinuses. Skull: The visualized skull base, calvarium and extracranial soft tissues are normal. Sinuses/Orbits: No fluid levels or advanced mucosal thickening of  the visualized paranasal sinuses. No mastoid or middle ear effusion. The orbits are normal. ASPECTS [facility] Stroke Program Early CT Score) - Ganglionic level infarction (caudate, lentiform nuclei, internal capsule, insula, M1-M3 cortex): 7 - Supraganglionic infarction (M4-M6 cortex): 3 Total score (0-10 with 10 being normal): 10 IMPRESSION: 1. No acute intracranial abnormality. 2. ASPECTS is 10. 3. Chronic ischemic microangiopathy and generalized atrophy. These results were communicated to Dr. Erick Blinks at 12:37 am on 06/21/2023 by text page via the [facility] messaging system. Electronically Signed   By: Deatra Robinson M.D.   On: 06/21/2023 00:37     Assessment and Plan:   Patient has a  history of mildly reduced LV systolic function, LVEF 45%.  He had been prescribed carvedilol, hydralazine, Imdur but apparently was not taking.  EKGs and prior echo are consistent with LVH.  Newly reduced LVEF could be blown out hypertensive heart disease.  He had a cardiac catheterization in 2023 which showed single-vessel CAD, 50 to 60% stenosis in the left circumflex.  Given this, a background of ischemic cardiomyopathy appears less likely, but cannot completely rule out progression as an etiology for the drop in his LVEF.  Given his recent stroke and background of advanced dementia, risk of pursuing ischemic evaluation and revascularization likely exceed benefits at this time. 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  It is possible that patient has moderate to severe AI which could be contributing to LV dilatation and reduced LVEF.  Given his acute stroke and baseline dementia, risk associated with TEE to evaluate for this likely outweigh benefits as he does not appear to be a candidate for any valvular replacement. For now, we will try to medically optimize him for management of his low LVEF.  There is also some concern that his stroke could have been embolic  from LV thrombus in setting of low LVEF and he is now on heparin.  This is possible; however, there was no mention of any active LV thrombus present on his most recent echocardiogram.   # Acute systolic heart failure # Dilated cardiomyopathy # Aortic insufficiency:   Plan: -Once outside the period of permissive hypertension for management of his acute stroke and patient is able to swallow oral medications safely, then recommend initiating a GDMT regimen.  Resume home carvedilol and start lisinopril.  We can uptitrate these medications as tolerated and add additional GDMT as able. -He appears euvolemic currently.  No need for diuresis at this time. -Patient is currently on heparin prophylactically due to concern for LV thrombus as etiology for stroke.  Will likely eventually transition to DOAC if able to swallow.  He would also benefit from cardiac event monitor to assess for atrial fibrillation in setting of stroke.  For questions or updates, please contact [facility] Please consult [email or web address] for contact info under    Signed, Nobie Putnam, MD  06/22/2023 2:34 PM

## 2023-06-22 NOTE — Progress Notes (Signed)
PHARMACY - ANTICOAGULATION CONSULT NOTE  Pharmacy Consult for heparin infusion Indication: stroke and cardiomyopathy with low EF  Allergies  Allergen Reactions   Codeine    Shellfish Allergy     Patient Measurements: Height: 5\' 4"  (162.6 cm) Weight: 60 kg (132 lb 4.4 oz) IBW/kg (Calculated) : 59.2 Heparin Dosing Weight: 60kg  Vital Signs: Temp: 98.8 F (37.1 C) (02/02 1946) Temp Source: Axillary (02/02 1946) BP: 151/123 (02/02 1946) Pulse Rate: 110 (02/02 1946)  Labs: Recent Labs    06/21/23 0016 06/21/23 0017 06/21/23 0837 06/22/23 1251 06/22/23 2133  HGB 13.0 13.6 12.9* 13.4  --   HCT 40.8 40.0 40.9 41.2  --   PLT 266  --  266 289  --   APTT 27  --   --   --  43*  LABPROT 13.5  --   --   --   --   INR 1.0  --   --   --   --   HEPARINUNFRC  --   --   --   --  0.46  CREATININE 1.05 1.10 1.05 1.31*  --     Estimated Creatinine Clearance: 38.9 mL/min (A) (by C-G formula based on SCr of 1.31 mg/dL (H)).   Medical History: Past Medical History:  Diagnosis Date   Arthritis    Hypertension     Medications:  Scheduled:   [START ON 06/23/2023] atorvastatin  80 mg Oral Daily   finasteride  5 mg Oral Daily   pantoprazole  40 mg Oral Daily   tamsulosin  0.4 mg Oral Daily    Assessment: 78 yo M presented with acute confusion and speech difficulty. PMH significant for previous stroke. Not on anticoagulation PTA. Found to have acute CVA on MRI. On echo, patient has hypokinesis of LV and severely decreased LV function. Pharmacy consulted to manage heparin infusion.  Per neurology, will start heparin infusion for stroke and low LVEF. Plavix and aspirin discontinued. Will not bolus given recent CVA and will use reduced therapeutic goal. Checking aptt with recent enoxaparin administration (40 mg at 0858 today).  Initial aPTT 43, not correlating with heparin level 0.46. No issues with infusion or overt s/sx of bleeding reported. Will use aPTT for dosing given the variance  between levels.   Goal of Therapy:  Heparin level 0.3-0.5 units/ml aPTT 66-85 seconds Monitor platelets by anticoagulation protocol: Yes   Plan:  Increase heparin infusion to 850 units/hr Check 8 hour heparin level and aptt Monitor daily heparin level, aptt, and CBC Monitor for signs/symptoms of bleeding F/u transition to oral anticoagulant  Ruben Im, PharmD Clinical Pharmacist 06/22/2023 10:00 PM Please check AMION for all [facility] numbers

## 2023-06-22 NOTE — Progress Notes (Signed)
PROGRESS NOTE  Dennis [name] ZOX:[id number] DOB: 1946-03-04 DOA: 06/21/2023 PCP: Pcp, No  HPI/Recap of past 24 hours: Dennis [name] is a 78 y.o. male with medical history significant of HLD, BPH, HTN, dementia, rheumatoid arthritis, COPD, Barrett's esophagus, OSA does not use CPAP, history of CVA, presents to [facility] ED vis EMS after significant other noted him to be acutely more confused than his baseline and having speech difficulty. EMS noted him to have (L) facial droop and expressive aphasia as well as hypertensive en route to ED. History was obtained entirely from chart review. In the ED, patient vital signs fairly stable except for uncontrolled BP. Lab work largely unrevealing. CT head negative for acute intracranial abnormality. MRI with acute infarct in the (L) posterior subinsular white matter and chronic microvascular ischemic diease. Neurology consulted.  Of note, patient was noted to be noncompliant with medications.  Patient admitted for further management.    Today, patient alert, awake, with significant aphasia.  Patient able to follow simple commands.   Assessment/Plan: Principal Problem:   CVA (cerebral vascular accident) (HCC) Active Problems:   HLD (hyperlipidemia)   HTN (hypertension)   BPH (benign prostatic hyperplasia)  Acute ischemic infarct Likely cardioembolic MRI showed left insula punctate acute infarcts in right frontal punctate subacute infarct CT head unremarkable ECHO with EF of 20 to 25%, global hypokinesis, moderate aortic valve thickening/calcification BLE Doppler, negative for DVT LDL 112, A1c 5.4 Neurology consulted, given possible cardioembolic stroke, starting IV heparin, and recommend DOAC once patient is able to tolerate orally and until EF is more than 30 to 35% Discontinue home ASA, Plavix once DOAC has been started Lipitor once able to tolerate orally Neurochecks, telemetry PT/OT/SLP-recommend n.p.o. for now, pending MBS SNF upon  discharge  Chronic systolic and diastolic HF Appears compensated, BNP pending Chest x-ray showed mild cardiomegaly, without congestion Echo showed EF of 20 to 25%, global hypokinesis, previous echo in 2023 showed EF of 45% Cardiology consulted, appreciate recs Neurology recommended starting IV heparin, and recommend DOAC once patient is able to tolerate orally and until EF is more than 30 to 35% Telemetry  Hypertension Uncontrolled on admission, allow for permissive hypertension PTA hydralazine, Coreg, not compliant Monitor closely  Hyperlipidemia LDL 112, was on Lipitor 40 mg, likely noncompliance Increase to 80 mg once able to tolerate orally   BPH Restart Proscar and Flomax once able     Estimated body mass index is 22.71 kg/m as calculated from the following:   Height as of this encounter: 5\' 4"  (1.626 m).   Weight as of this encounter: 60 kg.     Code Status: Full  Family Communication: None at bedside  Disposition Plan: Status is: Observation The patient will require care spanning > 2 midnights and should be moved to inpatient because: Level of care      Consultants: Neurology Cardiology  Procedures: None  Antimicrobials: None  DVT prophylaxis: IV heparin   Objective: Vitals:   06/21/23 2311 06/22/23 0404 06/22/23 0733 06/22/23 1213  BP: (!) 159/87 (!) 174/97 (!) 158/85 (!) 163/101  Pulse: 98 93 85 100  Resp: 18  16   Temp: 98.3 F (36.8 C) 98.6 F (37 C) (!) 97.3 F (36.3 C) 97.7 F (36.5 C)  TempSrc: Oral Axillary Oral Axillary  SpO2: 95% 94% 94% 93%  Weight:      Height:        Intake/Output Summary (Last 24 hours) at 06/22/2023 1436 Last data filed at 06/22/2023 [phone]  Gross per 24 hour  Intake 480 ml  Output 850 ml  Net -370 ml   Filed Weights   06/21/23 0000  Weight: 60 kg    Exam: General: NAD, confused, aphasia, able to follow simple commands Cardiovascular: S1, S2 present Respiratory: CTAB Abdomen: Soft, nontender,  nondistended, bowel sounds present Musculoskeletal: No bilateral pedal edema noted Skin: Normal Psychiatry: Fair mood     Data Reviewed: CBC: Recent Labs  Lab 06/21/23 0016 06/21/23 0017 06/21/23 0837 06/22/23 1251  WBC 7.9  --  7.6 8.1  NEUTROABS 3.9  --   --   --   HGB 13.0 13.6 12.9* 13.4  HCT 40.8 40.0 40.9 41.2  MCV 87.9  --  87.8 85.5  PLT 266  --  266 289   Basic Metabolic Panel: Recent Labs  Lab 06/21/23 0016 06/21/23 0017 06/21/23 0837 06/22/23 1251  NA 140 140 138 137  K 4.0 4.1 4.3 4.0  CL 105 105 103 105  CO2 26  --  22 23  GLUCOSE 100* 94 98 97  BUN 13 14 12 11   CREATININE 1.05 1.10 1.05 1.31*  CALCIUM 10.4*  --  10.0 9.6  MG  --   --  1.9  --    GFR: Estimated Creatinine Clearance: 38.9 mL/min (A) (by C-G formula based on SCr of 1.31 mg/dL (H)). Liver Function Tests: Recent Labs  Lab 06/21/23 0016  AST 18  ALT 13  ALKPHOS 29*  BILITOT 0.4  PROT 6.9  ALBUMIN 3.3*   No results for input(s): "LIPASE", "AMYLASE" in the last 168 hours. No results for input(s): "AMMONIA" in the last 168 hours. Coagulation Profile: Recent Labs  Lab 06/21/23 0016  INR 1.0   Cardiac Enzymes: No results for input(s): "CKTOTAL", "CKMB", "CKMBINDEX", "TROPONINI" in the last 168 hours. BNP (last 3 results) No results for input(s): "PROBNP" in the last 8760 hours. HbA1C: Recent Labs    06/21/23 0837  HGBA1C 5.4   CBG: Recent Labs  Lab 06/21/23 0013 06/21/23 2317  GLUCAP 88 99   Lipid Profile: Recent Labs    06/21/23 0837  CHOL 159  HDL 31*  LDLCALC 112*  TRIG 78  CHOLHDL 5.1   Thyroid Function Tests: No results for input(s): "TSH", "T4TOTAL", "FREET4", "T3FREE", "THYROIDAB" in the last 72 hours. Anemia Panel: No results for input(s): "VITAMINB12", "FOLATE", "FERRITIN", "TIBC", "IRON", "RETICCTPCT" in the last 72 hours. Urine analysis:    Component Value Date/Time   COLORURINE STRAW (A) 06/21/2023 0047   APPEARANCEUR CLEAR 06/21/2023 0047    LABSPEC 1.009 06/21/2023 0047   PHURINE 6.0 06/21/2023 0047   GLUCOSEU NEGATIVE 06/21/2023 0047   HGBUR NEGATIVE 06/21/2023 0047   BILIRUBINUR NEGATIVE 06/21/2023 0047   KETONESUR NEGATIVE 06/21/2023 0047   PROTEINUR NEGATIVE 06/21/2023 0047   NITRITE NEGATIVE 06/21/2023 0047   LEUKOCYTESUR NEGATIVE 06/21/2023 0047   Sepsis Labs: @LABRCNTIP (procalcitonin:4,lacticidven:4)  )No results found for this or any previous visit (from the past 240 hours).    Studies: DG CHEST PORT 1 VIEW Result Date: 06/22/2023 CLINICAL DATA:  Congestive heart failure EXAM: PORTABLE CHEST 1 VIEW COMPARISON:  03/04/2023 FINDINGS: Numerous leads and wires project over the chest. Apical lordotic positioning. Midline trachea. Borderline cardiomegaly. Atherosclerosis in the transverse aorta. No pleural fluid. A density difference over the upper left hemithorax is favored to represent a skin fold. There may be minimal left lower lobe scarring laterally. IMPRESSION: Mild cardiomegaly, without congestive failure. Aortic Atherosclerosis (ICD10-I70.0). Electronically Signed   By: Jeronimo Greaves  M.D.   On: 06/22/2023 11:15   CT ANGIO HEAD NECK W WO CM Result Date: 06/22/2023 CLINICAL DATA:  Stroke/TIA, determine embolic source EXAM: CT ANGIOGRAPHY HEAD AND NECK WITH AND WITHOUT CONTRAST TECHNIQUE: Multidetector CT imaging of the head and neck was performed using the standard protocol during bolus administration of intravenous contrast. Multiplanar CT image reconstructions and MIPs were obtained to evaluate the vascular anatomy. Carotid stenosis measurements (when applicable) are obtained utilizing NASCET criteria, using the distal internal carotid diameter as the denominator. RADIATION DOSE REDUCTION: This exam was performed according to the departmental dose-optimization program which includes automated exposure control, adjustment of the mA and/or kV according to patient size and/or use of iterative reconstruction technique.  CONTRAST:  75mL OMNIPAQUE IOHEXOL 350 MG/ML SOLN COMPARISON:  CT head October 5, 24. FINDINGS: CT HEAD FINDINGS Brain: No evidence of acute infarction, hemorrhage, hydrocephalus, extra-axial collection or mass lesion/mass effect. Similar patchy white matter hypodensities, compatible with chronic microvascular ischemic disease. Similar remote infarct in the right thalamic and left caudate lacunar infarcts. Vascular: See below. Skull: No acute fracture. Sinuses/Orbits: No acute abnormality. Review of the MIP images confirms the above findings CTA NECK FINDINGS Aortic arch: Calcific atherosclerosis of the aorta. Great vessel origins are patent. Right carotid system: Calcific atherosclerosis of the carotid bifurcation involving the proximal ICA with approximately 60% stenosis of the right proximal ICA. Left carotid system: Atherosclerosis at the carotid bifurcation and involving the proximal ICA with approximately 50% stenosis of the proximal ICA. Vertebral arteries: Left dominant. Severe right vertebral artery origin stenosis. Left vertebral artery is patent without significant stenosis. Skeleton: No acute abnormality.  Multilevel degenerative change. Other neck: No acute abnormality limits assessment. Upper chest: Visualized lung apices are clear. Review of the MIP images confirms the above findings CTA HEAD FINDINGS Anterior circulation: Bilateral intracranial ICAs, MCAs, and ACAs are patent without proximal 8 imminently significant stenosis. Right ACA is hypoplastic. Posterior circulation: Bilateral intradural vertebral arteries, basilar artery and bilateral posterior cerebral arteries are patent without proximal hemodynamically significant stenosis. Venous sinuses: As permitted by contrast timing, patent. Review of the MIP images confirms the above findings IMPRESSION: CT HEAD: No evidence of acute intracranial abnormality. CTA: 1. No emergent large vessel occlusion. 2. Approximately 60% stenosis of the right  proximal ICA. 3. Approximately 50% stenosis of the left proximal ICA. 4. Severe right vertebral artery origin stenosis. 5. Aortic Atherosclerosis (ICD10-I70.0). Electronically Signed   By: Feliberto Harts M.D.   On: 06/22/2023 04:00    Scheduled Meds:  atorvastatin  40 mg Oral Daily   finasteride  5 mg Oral Daily   pantoprazole  40 mg Oral Daily   tamsulosin  0.4 mg Oral Daily    Continuous Infusions:  heparin       LOS: 0 days     Briant Cedar, MD [facility]  If 7PM-7AM, please contact night-coverage [email or web address] 06/22/2023, 2:36 PM

## 2023-06-22 NOTE — Progress Notes (Signed)
Speech Language Pathology Treatment: Dysphagia  Patient Details Name: Dennis [name] MRN: [id number] DOB: [birth year] Today's Date: 06/22/2023 Time: 4098-1191 SLP Time Calculation (min) (ACUTE ONLY): 17 min  Assessment / Plan / Recommendation Clinical Impression  Pt seen for po trials and able to assist with hand over hand self feeding. Pt verbalizing however unable to decipher more than one word given expressive aphasia There are continued concerns for oropharyngeal dysphagia marked by decreased oral control resulting in anterior spill with thin and immediate cough due to likely airway intrusion. One cough of 2 trials with nectar thick although he did take a larger sip. He transited puree, initiated swallow without s/s aspiration. Pt will need an MBS to evaluate swallow function and make safest recommendations. Recommend continue NPO except meds crushed in applesauce and MBS as soon as able to accommodate.    HPI HPI: Pt is a 78 y.o. male presented to ED after significant other noted him to be acutely more confused than his baseline and having speech difficulty. EMS noted him to have (L) facial droop and expressive aphasia as well as hypertensive en route to ED. MRI (06/21/23) revealed, "Punctate acute infarct in the left posterior subinsular white matter... small area of acute infarct in the more superior left frontal cortex. There also 2 punctate areas of diffusion hyperintensity without ADC correlate in the superior right frontal lobe which may be areas of subacute ischemia". Previous acute SLP services provided for dysphagia on 03/03/23, recommended advancement of diet to dys 3/thin liquids given improvement in mentation and respiratory status. PMH: HLD, BPH, HTN, dementia, rheumatoid arthritis, COPD, Barrett's esophagus, OSA does not use CPAP, history of CVA.      SLP Plan  New goals to be determined pending instrumental study      Recommendations for follow up therapy are one component of a  multi-disciplinary discharge planning process, led by the attending physician.  Recommendations may be updated based on patient status, additional functional criteria and insurance authorization.    Recommendations  Diet recommendations: NPO;Other(comment) (excpet meds crushed in applesauce) Liquids provided via: Cup Medication Administration: Crushed with puree Supervision: Staff to assist with self feeding Compensations: Slow rate;Small sips/bites Postural Changes and/or Swallow Maneuvers: Seated upright 90 degrees                  Oral care QID   Frequent or constant Supervision/Assistance Dysphagia, unspecified (R13.10)     New goals to be determined pending instrumental study     Royce Macadamia  06/22/2023, 11:47 AM

## 2023-06-22 NOTE — Progress Notes (Signed)
PHARMACY - ANTICOAGULATION CONSULT NOTE  Pharmacy Consult for heparin infusion Indication: stroke and cardiomyopathy with low EF  Allergies  Allergen Reactions   Codeine    Shellfish Allergy     Patient Measurements: Height: 5\' 4"  (162.6 cm) Weight: 60 kg (132 lb 4.4 oz) IBW/kg (Calculated) : 59.2 Heparin Dosing Weight: 60kg  Vital Signs: Temp: 97.7 F (36.5 C) (02/02 1213) Temp Source: Axillary (02/02 1213) BP: 163/101 (02/02 1213) Pulse Rate: 100 (02/02 1213)  Labs: Recent Labs    06/21/23 0016 06/21/23 0017 06/21/23 0837  HGB 13.0 13.6 12.9*  HCT 40.8 40.0 40.9  PLT 266  --  266  APTT 27  --   --   LABPROT 13.5  --   --   INR 1.0  --   --   CREATININE 1.05 1.10 1.05    Estimated Creatinine Clearance: 48.6 mL/min (by C-G formula based on SCr of 1.05 mg/dL).   Medical History: Past Medical History:  Diagnosis Date   Arthritis    Hypertension     Medications:  Scheduled:   atorvastatin  40 mg Oral Daily   finasteride  5 mg Oral Daily   pantoprazole  40 mg Oral Daily   tamsulosin  0.4 mg Oral Daily    Assessment: 78 yo M presented with acute confusion and speech difficulty. PMH significant for previous stroke. Not on anticoagulation PTA. Found to have acute CVA on MRI. On echo, patient has hypokinesis of LV and severely decreased LV function. Pharmacy consulted to manage heparin infusion.  Per neurology, will start heparin infusion for stroke and low LVEF. Plavix and aspirin discontinued. Will not bolus given recent CVA and will use reduced therapeutic goal. Checking aptt with recent enoxaparin administration (40 mg at 0858 today).   Goal of Therapy:  Heparin level 0.3-0.5 units/ml Monitor platelets by anticoagulation protocol: Yes   Plan:  Start heparin infusion at 750 units/hr Check 8 hour heparin level and aptt Monitor daily heparin level, aptt, and CBC Monitor for signs/symptoms of bleeding F/u transition to oral anticoagulant   Romie Minus, PharmD PGY1 Pharmacy Resident  Please check AMION for all [facility] phone numbers After 10:00 PM, call Main Pharmacy [phone] 06/22/2023,12:48 PM

## 2023-06-22 NOTE — Progress Notes (Signed)
Modified Barium Swallow Study  Patient Details  Name: Dennis [name] MRN: [id number] Date of Birth: 1945/10/27  Today's Date: 06/22/2023  Modified Barium Swallow completed.  Full report located under Chart Review in the Imaging Section.  History of Present Illness Pt is a 78 y.o. male presented to ED after significant other noted him to be acutely more confused than his baseline and having speech difficulty. EMS noted him to have (L) facial droop and expressive aphasia as well as hypertensive en route to ED. MRI (06/21/23) revealed, "Punctate acute infarct in the left posterior subinsular white matter... small area of acute infarct in the more superior left frontal cortex. There also 2 punctate areas of diffusion hyperintensity without ADC correlate in the superior right frontal lobe which may be areas of subacute ischemia". Previous acute SLP services provided for dysphagia on 03/03/23, recommended advancement of diet to dys 3/thin liquids given improvement in mentation and respiratory status. PMH: HLD, BPH, HTN, dementia, rheumatoid arthritis, COPD, Barrett's esophagus, OSA does not use CPAP, history of CVA.   Clinical Impression Pt exhibited significant restlessness with excessive movement, distractibilty and decreased attention during MBS. Oral impairments included decreased labial seal with spillage, slow transit, sub lingual and lingual spillage and significant right buccal cavity/sulci residue. Cup sips provided increased awareness and improved transit than teaspoon trials. Swallow initiation was delayed with nectar reaching the pyriform sinuses and silently aspirated during the swallow. There was collection of residue in valleculae and pyriform sinuses with thin which was aspirated as he performed a spontaneous second swallow resulting in a hard reflexive cough. Overall residue was not significant with thicker textures. He was unable to attend to or follow commands for compensatory strategies. His  laryngeal elevation and epiglottic inversion appeared adequate with inconsistent partial hyoid anterior excursion. Solid texture was not administered and right side of oral cavity required suctioning at end of study. Esophageal scan was unremarkable. Recommend Dys 1 (puree), honey thick liquids, crush meds, place utensil on left side oral cavity, check for pocketing on right side and full assist with meals. Factors that may increase risk of adverse event in presence of aspiration [name] 2021): Reduced cognitive function  Swallow Evaluation Recommendations Recommendations: PO diet PO Diet Recommendation: Dysphagia 1 (Pureed);Moderately thick liquids (Level 3, honey thick) Liquid Administration via: Cup Medication Administration: Crushed with puree Swallowing strategies  : Small bites/sips;Slow rate;Check for pocketing or oral holding;Check for anterior loss Postural changes: Position pt fully upright for meals Oral care recommendations: Oral care BID (2x/day)      Royce Macadamia 06/22/2023,3:33 PM

## 2023-06-23 DIAGNOSIS — I5021 Acute systolic (congestive) heart failure: Secondary | ICD-10-CM | POA: Diagnosis not present

## 2023-06-23 DIAGNOSIS — I10 Essential (primary) hypertension: Secondary | ICD-10-CM

## 2023-06-23 DIAGNOSIS — I63413 Cerebral infarction due to embolism of bilateral middle cerebral arteries: Secondary | ICD-10-CM | POA: Diagnosis not present

## 2023-06-23 LAB — BASIC METABOLIC PANEL
Anion gap: 9 (ref 5–15)
BUN: 13 mg/dL (ref 8–23)
CO2: 23 mmol/L (ref 22–32)
Calcium: 9.6 mg/dL (ref 8.9–10.3)
Chloride: 107 mmol/L (ref 98–111)
Creatinine, Ser: 1.07 mg/dL (ref 0.61–1.24)
GFR, Estimated: >60 mL/min (ref >60)
Glucose, Bld: 102 mg/dL — ABNORMAL HIGH (ref 70–99)
Potassium: 4.1 mmol/L (ref 3.5–5.1)
Sodium: 139 mmol/L (ref 135–145)

## 2023-06-23 LAB — CBC
HCT: 44.2 % (ref 39.0–52.0)
Hemoglobin: 14.4 g/dL (ref 13.0–17.0)
MCH: 27.9 pg (ref 26.0–34.0)
MCHC: 32.6 g/dL (ref 30.0–36.0)
MCV: 85.5 fL (ref 80.0–100.0)
Platelets: 272 10*3/uL (ref 150–400)
RBC: 5.17 MIL/uL (ref 4.22–5.81)
RDW: 15.3 % (ref 11.5–15.5)
WBC: 10 10*3/uL (ref 4.0–10.5)
nRBC: 0 % (ref 0.0–0.2)

## 2023-06-23 LAB — APTT: aPTT: 56 s — ABNORMAL HIGH (ref 24–36)

## 2023-06-23 LAB — HEPARIN LEVEL (UNFRACTIONATED): Heparin Unfractionated: 0.5 [IU]/mL (ref 0.30–0.70)

## 2023-06-23 MED ORDER — STROKE: EARLY STAGES OF RECOVERY BOOK
Status: AC
  Filled 2023-06-23: qty 1

## 2023-06-23 MED ORDER — CARVEDILOL 6.25 MG PO TABS: 6.2500 mg | ORAL_TABLET | Freq: Two times a day (BID) | ORAL | Status: DC

## 2023-06-23 MED ORDER — APIXABAN 5 MG PO TABS
5.0000 mg | ORAL_TABLET | Freq: Two times a day (BID) | ORAL | Status: DC
  Administered 2023-06-23 – 2023-08-14 (×104): 5 mg via ORAL
  Filled 2023-06-23 (×7): qty 1
  Filled 2023-06-23: qty 2
  Filled 2023-06-23 (×96): qty 1

## 2023-06-23 MED ORDER — CARVEDILOL 6.25 MG PO TABS
6.2500 mg | ORAL_TABLET | Freq: Two times a day (BID) | ORAL | Status: DC
Start: 2023-06-23 — End: 2023-06-25
  Administered 2023-06-23 – 2023-06-25 (×4): 6.25 mg via ORAL
  Filled 2023-06-23 (×4): qty 1

## 2023-06-23 MED ORDER — HYDRALAZINE HCL 20 MG/ML IJ SOLN
10.0000 mg | INTRAMUSCULAR | Status: DC | PRN
  Administered 2023-06-27: 10 mg via INTRAVENOUS
  Filled 2023-06-23 (×2): qty 1

## 2023-06-23 MED ORDER — CARVEDILOL 12.5 MG PO TABS
12.5000 mg | ORAL_TABLET | Freq: Two times a day (BID) | ORAL | Status: DC
Start: 2023-06-23 — End: 2023-06-23
  Administered 2023-06-23: 12.5 mg via ORAL
  Filled 2023-06-23: qty 1

## 2023-06-23 NOTE — Plan of Care (Signed)
More alert this afternoon.  Speech garbled but clearer than before.  No signs of distress.  No complaints.    Problem: Nutrition: Goal: Risk of aspiration will decrease Outcome: Progressing   Problem: Nutrition: Goal: Dietary intake will improve Outcome: Progressing   Problem: Clinical Measurements: Goal: Respiratory complications will improve Outcome: Progressing   Problem: Activity: Goal: Risk for activity intolerance will decrease Outcome: Progressing   Problem: Coping: Goal: Level of anxiety will decrease Outcome: Progressing

## 2023-06-23 NOTE — Discharge Instructions (Addendum)

## 2023-06-23 NOTE — Progress Notes (Signed)
Please see consult note for full details on 06/22/2023.  Agree with plan. We will go ahead and sign off.  Please let us know if we can be of further assistance.  Donato Schultz, MD

## 2023-06-23 NOTE — Progress Notes (Signed)
PHARMACY - ANTICOAGULATION CONSULT NOTE  Pharmacy Consult for heparin infusion transition to apixaban Indication: stroke and cardiomyopathy with low EF  Allergies  Allergen Reactions   Codeine    Shellfish Allergy     Patient Measurements: Height: 5\' 4"  (162.6 cm) Weight: 60 kg (132 lb 4.4 oz) IBW/kg (Calculated) : 59.2 Heparin Dosing Weight: 60kg  Vital Signs: Temp: 97.7 F (36.5 C) (02/03 0700) Temp Source: Axillary (02/03 0700) BP: 161/76 (02/03 0700) Pulse Rate: 86 (02/03 0700)  Labs: Recent Labs    06/21/23 0016 06/21/23 0017 06/21/23 0837 06/22/23 1251 06/22/23 2133 06/23/23 0619  HGB 13.0   < > 12.9* 13.4  --  14.4  HCT 40.8   < > 40.9 41.2  --  44.2  PLT 266  --  266 289  --  272  APTT 27  --   --   --  43* 56*  LABPROT 13.5  --   --   --   --   --   INR 1.0  --   --   --   --   --   HEPARINUNFRC  --   --   --   --  0.46 0.50  CREATININE 1.05   < > 1.05 1.31*  --  1.07   < > = values in this interval not displayed.    Estimated Creatinine Clearance: 47.6 mL/min (by C-G formula based on SCr of 1.07 mg/dL).   Medical History: Past Medical History:  Diagnosis Date   Arthritis    Hypertension     Medications:  Scheduled:   apixaban  5 mg Oral BID   atorvastatin  80 mg Oral Daily   carvedilol  12.5 mg Oral BID WC   finasteride  5 mg Oral Daily   pantoprazole  40 mg Oral Daily   tamsulosin  0.4 mg Oral Daily    Assessment: 78 yo M presented with acute confusion and speech difficulty. PMH significant for previous stroke. Not on anticoagulation PTA. Found to have acute CVA on MRI. On echo, patient has hypokinesis of LV and severely decreased LV function. Pharmacy consulted to manage heparin infusion.  Per neurology, will start heparin infusion for stroke and low LVEF. Plavix and aspirin discontinued. Will not bolus given recent CVA and will use reduced therapeutic goal. Checking aptt with recent enoxaparin administration (40 mg at 0858  today).  Initial aPTT 43, not correlating with heparin level 0.46. No issues with infusion or overt s/sx of bleeding reported. Will use aPTT for dosing given the variance between levels.   2/3 AM update: HL 0.5 aPTT 56 seconds Hgb / PLT wnl No signs of bleeding or issues with heparin infusion per nursing   Goal of Therapy:  Monitor platelets by anticoagulation protocol: Yes   Plan:  Stop heparin infusion. Start apixaban 5 mg po bid Monitor for signs of bleeding Pharmacy will sign off consult but continue to monitor making recs prn Thank you  Greta Doom BS, PharmD, BCPS Clinical Pharmacist 06/23/2023 8:01 AM  Contact: [phone] after 3 PM  "Be curious, not judgmental..." -Debbora Dus

## 2023-06-23 NOTE — Progress Notes (Signed)
STROKE TEAM PROGRESS NOTE   BRIEF HPI [name] is a 78 y.o. male with PMH significant for HTN, probable advanced dementia, arthritis who was BIB EMS as a CODE STROKE due to confusion. On neurology assessment, patient was confused with some aphasia. CTH negative NIH on Admission: 4  MRI shows Punctate acute infarct in the left posterior subinsular white matter. Admitted for stroke workup.   INTERIM HISTORY/SUBJECTIVE No family bedside.  No acute events overnight.  Patient reclining in bed, more awake alert, speaking to me but intangible words, severe dysarthria and hard to be understood. He still able to follow some midline comments but not peripheral commands. Now on eliquis.   OBJECTIVE  CBC    Component Value Date/Time   WBC 10.0 06/23/2023 0619   RBC 5.17 06/23/2023 0619   HGB 14.4 06/23/2023 0619   HCT 44.2 06/23/2023 0619   PLT 272 06/23/2023 0619   MCV 85.5 06/23/2023 0619   MCH 27.9 06/23/2023 0619   MCHC 32.6 06/23/2023 0619   RDW 15.3 06/23/2023 0619   LYMPHSABS 2.6 06/21/2023 0016   MONOABS 1.1 (H) 06/21/2023 0016   EOSABS 0.2 06/21/2023 0016   BASOSABS 0.1 06/21/2023 0016    BMET    Component Value Date/Time   NA 139 06/23/2023 0619   K 4.1 06/23/2023 0619   CL 107 06/23/2023 0619   CO2 23 06/23/2023 0619   GLUCOSE 102 (H) 06/23/2023 0619   BUN 13 06/23/2023 0619   CREATININE 1.07 06/23/2023 0619   CALCIUM 9.6 06/23/2023 0619   GFRNONAA >60 06/23/2023 0619    IMAGING past 24 hours DG Swallowing Func-Speech Pathology Result Date: 06/22/2023 Table formatting from the original result was not included. Modified Barium Swallow Study Patient Details Name: Dennis [name] MRN: [id number] Date of Birth: 11-06-1945 Today's Date: 06/22/2023 HPI/PMH: HPI: Pt is a 78 y.o. male presented to ED after significant other noted him to be acutely more confused than his baseline and having speech difficulty. EMS noted him to have (L) facial droop and expressive aphasia as well  as hypertensive en route to ED. MRI (06/21/23) revealed, "Punctate acute infarct in the left posterior subinsular white matter... small area of acute infarct in the more superior left frontal cortex. There also 2 punctate areas of diffusion hyperintensity without ADC correlate in the superior right frontal lobe which may be areas of subacute ischemia". Previous acute SLP services provided for dysphagia on 03/03/23, recommended advancement of diet to dys 3/thin liquids given improvement in mentation and respiratory status. PMH: HLD, BPH, HTN, dementia, rheumatoid arthritis, COPD, Barrett's esophagus, OSA does not use CPAP, history of CVA. Clinical Impression: Clinical Impression: Pt exhibited significant restlessness with excessive movement, distractibilty and decreased attention during MBS. Oral impairments included decreased labial seal with spillage, slow transit, sub lingual and lingual spillage and significant right buccal cavity/sulci residue. Cup sips provided increased awareness and improved transit than teaspoon trials. Swallow initiation was delayed with nectar reaching the pyriform sinuses and silently aspirated during the swallow. There was collection of residue in valleculae and pyriform sinuses with thin which was aspirated as he performed a spontaneous second swallow resulting in a hard reflexive cough. Overall residue was not significant with thicker textures. He was unable to attend to or follow commands for compensatory strategies. His laryngeal elevation and epiglottic inversion appeared adequate with inconsistent partial hyoid anterior excursion. Solid texture was not administered and right side of oral cavity required suctioning at end of study. Esophageal scan was  unremarkable. Recommend Dys 1 (puree), honey thick liquids, crush meds, place utensil on left side oral cavity, check for pocketing on right side and full assist with meals. Factors that may increase risk of adverse event in presence of  aspiration [name] 2021): Factors that may increase risk of adverse event in presence of aspiration [name] 2021): Reduced cognitive function Recommendations/Plan: Swallowing Evaluation Recommendations Swallowing Evaluation Recommendations Recommendations: PO diet PO Diet Recommendation: Dysphagia 1 (Pureed); Moderately thick liquids (Level 3, honey thick) Liquid Administration via: Cup Medication Administration: Crushed with puree Swallowing strategies  : Small bites/sips; Slow rate; Check for pocketing or oral holding; Check for anterior loss Postural changes: Position pt fully upright for meals Oral care recommendations: Oral care BID (2x/day) Treatment Plan Treatment Plan Treatment recommendations: Therapy as outlined in treatment plan below Follow-up recommendations: Skilled nursing-short term rehab (<3 hours/day) Functional status assessment: Patient has had a recent decline in their functional status and demonstrates the ability to make significant improvements in function in a reasonable and predictable amount of time. Treatment frequency: Min 2x/week Treatment duration: 2 weeks Interventions: Patient/family education; Trials of upgraded texture/liquids; Diet toleration management by SLP Recommendations Recommendations for follow up therapy are one component of a multi-disciplinary discharge planning process, led by the attending physician.  Recommendations may be updated based on patient status, additional functional criteria and insurance authorization. Assessment: Orofacial Exam: Orofacial Exam Oral Cavity: Oral Hygiene: WFL Oral Cavity - Dentition: Poor condition; Missing dentition Oral Motor/Sensory Function: Suspected cranial nerve impairment CN V - Trigeminal: Right motor impairment; Right sensory impairment CN VII - Facial: Right motor impairment Anatomy: Anatomy: WFL Boluses Administered: Boluses Administered Boluses Administered: Thin liquids (Level 0); Mildly thick liquids  (Level 2, nectar thick); Moderately thick liquids (Level 3, honey thick); Puree  Oral Impairment Domain: Oral Impairment Domain Lip Closure: Escape progressing to mid-chin Tongue control during bolus hold: Escape to lateral buccal cavity/floor of mouth Bolus preparation/mastication: -- (not administer solids) Bolus transport/lingual motion: Slow tongue motion Oral residue: Residue collection on oral structures Location of oral residue : Floor of mouth; Tongue; Lateral sulci Initiation of pharyngeal swallow : Pyriform sinuses  Pharyngeal Impairment Domain: Pharyngeal Impairment Domain Soft palate elevation: No bolus between soft palate (SP)/pharyngeal wall (PW) Laryngeal elevation: Complete superior movement of thyroid cartilage with complete approximation of arytenoids to epiglottic petiole Anterior hyoid excursion: Partial anterior movement Epiglottic movement: Complete inversion Laryngeal vestibule closure: Incomplete, narrow column air/contrast in laryngeal vestibule Pharyngeal stripping wave : Present - complete Pharyngeal contraction (A/P view only): N/A Pharyngoesophageal segment opening: Complete distension and complete duration, no obstruction of flow Tongue base retraction: No contrast between tongue base and posterior pharyngeal wall (PPW) Pharyngeal residue: Collection of residue within or on pharyngeal structures (with thin in valleculae) Location of pharyngeal residue: Valleculae; Pyriform sinuses (with thin)  Esophageal Impairment Domain: Esophageal Impairment Domain Esophageal clearance upright position: Complete clearance, esophageal coating Pill: No data recorded Penetration/Aspiration Scale Score: Penetration/Aspiration Scale Score 1.  Material does not enter airway: Puree; Moderately thick liquids (Level 3, honey thick) 7.  Material enters airway, passes BELOW cords and not ejected out despite cough attempt by patient: Thin liquids (Level 0) 8.  Material enters airway, passes BELOW cords without  attempt by patient to eject out (silent aspiration) : Mildly thick liquids (Level 2, nectar thick) Compensatory Strategies: Compensatory Strategies Compensatory strategies: No (pt unable due to aphasia and poor attention)   General Information: Caregiver present: No  Diet Prior to this Study: Other (  Comment) (NPO except meds crushed in applesauce)   Temperature : Normal   Respiratory Status: WFL   Supplemental O2: None (Room air)   History of Recent Intubation: No  Behavior/Cognition: Impulsive (restless) Self-Feeding Abilities: Needs assist with self-feeding Baseline vocal quality/speech: Hypophonia/low volume Volitional Cough: Unable to elicit Volitional Swallow: Unable to elicit Exam Limitations: Excessive movement Goal Planning: Prognosis for improved oropharyngeal function: Fair Barriers to Reach Goals: Cognitive deficits; Other (Comment); Severity of deficits (aphasia) No data recorded Patient/Family Stated Goal: none stated Consulted and agree with results and recommendations: Pt unable/family or caregiver not available (pt unable and wife with difficulty comprehending information - has dementia per neighbors) Pain: Pain Assessment Pain Assessment: Faces Faces Pain Scale: 0 Breathing: 0 Negative Vocalization: 0 Facial Expression: 0 Body Language: 0 Consolability: 0 PAINAD Score: 0 End of Session: Start Time:SLP Start Time (ACUTE ONLY): 1354 Stop Time: SLP Stop Time (ACUTE ONLY): 1411 Time Calculation:SLP Time Calculation (min) (ACUTE ONLY): 17 min Charges: SLP Evaluations $ SLP Speech Visit: 1 Visit SLP Evaluations $BSS Swallow: 1 Procedure $MBS Swallow: 1 Procedure $ SLP EVAL LANGUAGE/SOUND PRODUCTION: 1 Procedure $Swallowing Treatment: 1 Procedure SLP visit diagnosis: SLP Visit Diagnosis: Dysphagia, oropharyngeal phase (R13.12) Past Medical History: Past Medical History: Diagnosis Date  Arthritis   Hypertension  Past Surgical History: Past Surgical History: Procedure Laterality Date  CHOLECYSTECTOMY     THROAT SURGERY   Royce Macadamia 06/22/2023, 3:32 PM   Vitals:   06/23/23 0402 06/23/23 0700 06/23/23 0800 06/23/23 1104  BP: (!) 197/101 (!) 161/76 (!) 156/72 127/82  Pulse: (!) 104 86  74  Resp: 20 15    Temp: 97.7 F (36.5 C) 97.7 F (36.5 C)  97.8 F (36.6 C)  TempSrc: Axillary Axillary  Oral  SpO2: 99% 98%    Weight:      Height:         PHYSICAL EXAM General:  Lethargic, well-nourished, well-developed patient in no acute distress. Restless. Requiring Restraints.  CV: Regular rate and rhythm on monitor Respiratory:  Regular, unlabored respirations on room air GI: Abdomen soft and nontender   NEURO:  Mental Status: lethargic, knows he is in the hospital unable to answer further orientation questions.  Speech/Language: severely dysarthria with intangible words. Able to follow some midline commands, but not follow peripheral commands.   Cranial Nerves:  II: PERRL. Inconsistent blink to threat on right.  III, IV, VI: EOMI. Eyelids elevate symmetrically.  V: Sensation is intact to light touch and symmetrical to face.  VII: Right facial droop VIII: hearing intact to voice. IX, X: Palate elevates symmetrically. Phonation is normal.  BJ:[id number], unable to assess XII: tongue is midline without fasciculations. Motor:  Spontaneous movement seen throughout. Does not move on command. Symmetrical but on b/l wrist soft restrains Tone: is normal and bulk is normal Sensation- withdraws throughout.  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  MRI  Punctate acute infarct in the left posterior subinsular white matter.  Left frontal linear cortical infarct, subacute right frontal punctate  infarct. 2D Echo: LVEF 20 to 25%, global hypokinesis, mild MVR, mild AVR, moderate aortic valve thickening, moderate aortic valve calcification LE venous Doppler no DVT LDL 105 HgbA1c 5.5 VTE prophylaxis - lovenox clopidogrel 75 mg  daily prior to admission (patient not taking), on Eliquis now. Continue on discharge, and repeat echo in 3 months, continue eliquis until EF more than 30-35%.  Therapy recommendations: SNF Disposition: Pending, SW on board.   Cardiomyopathy Home meds including Imdur, Coreg but patient not taking 2023: EF 45%, saw Dr. Milagros Evener in [facility] This admission EF 20 to 25%. Cardiology on board Recommend GDMT On coreg now.  Given cardiovascular results, now on eliquis until EF more than 30-35%.  Hypertension Home meds:  hydralazine 50mg  Q8H, coreg 3.125 BID, but patient not taking Stable on the high end Now on coreg Long-term BP goal normotensive  Hyperlipidemia Home meds:  Lipitor 40mg , but patient not taking home meds reliably LDL 105, goal < 70 Increase to Lipitor 80mg   Continue statin at discharge  Dysphagia Speech on board Now on dys 3 and honey thick liquid On gentle IV fluid  AKI Cre 1.05->1.31->1.07 Likely due to dehydration with dysphagia Put on gentle hydration -> now off Monitoring BMP  Other Active Problems ?Advanced Dementia Pending memory care placement per 1/19 ED visit notes No home medications RA Methotrextate home medication  Hospital day # 1  Neurology will sign off. Please call with questions. Pt will follow up with stroke clinic NP at [facility] in about 4 weeks. Thanks for the consult.   Marvel Plan, MD PhD Stroke Neurology 06/23/2023 1:49 PM

## 2023-06-23 NOTE — Progress Notes (Signed)
Physical Therapy Treatment Patient Details Name: RUFFIN LADA MRN: [id number] DOB: Jan 12, 1946 Today's Date: 06/23/2023   History of Present Illness The pt is a 78 yo male presenting 2/1 from home with concern for stroke (slurred speech and mild L facial droop). MRI showed:  small area of acute infarct in the more superior left frontal cortex and 2 areas of subacute ischemia in superior R frontal lobe. PMH includes: dementia, arthritis, HTN, HLD, RA, COPD, OSA, and CVA.    PT Comments  Pt seen for PT tx with pt agreeable with encouragement. Pt with fluctuating alertness/lethargy throughout session, very poor ability to follow simple commands even with multimodal cuing. Pt requires up to +2 assist to ambulate in room, very difficult to redirect, poor ability to follow instructions when mobilizing. Pt reports need to have BM but only has continent void on toilet. Pt assisted back to bed & left with all needs in reach.     If plan is discharge home, recommend the following: Two people to help with walking and/or transfers;Two people to help with bathing/dressing/bathroom;Direct supervision/assist for medications management;Help with stairs or ramp for entrance;Assist for transportation;Assistance with feeding;Direct supervision/assist for financial management;Assistance with cooking/housework;Supervision due to cognitive status   Can travel by private vehicle     No  Equipment Recommendations  Other (comment) (defer to next venue)    Recommendations for Other Services       Precautions / Restrictions Precautions Precautions: Fall Precaution Comments: watch BP, wrist restraints Restrictions Weight Bearing Restrictions Per Provider Order: No     Mobility  Bed Mobility Overal bed mobility: Needs Assistance Bed Mobility: Supine to Sit     Supine to sit: Max assist, HOB elevated, Used rails (pt with little to no participation in supine>sit) Sit to supine: Mod assist, HOB elevated, Used  rails   General bed mobility comments: +2 to scoot to [facility]    Transfers Overall transfer level: Needs assistance Equipment used: 1 person hand held assist, Rolling walker (2 wheels) Transfers: Sit to/from Stand Sit to Stand: Min assist, Mod assist           General transfer comment: STS from EOB, +2 for STS from toilet for safety    Ambulation/Gait Ambulation/Gait assistance: Mod assist, Min assist Gait Distance (Feet): 10 Feet (+ 12 ft + 12 ft) Assistive device: Rolling walker (2 wheels), None, 2 person hand held assist, 1 person hand held assist   Gait velocity: decreased     General Gait Details: Pt voluntarily begins to ambulate in room with RW & min assist but then stops & appears lethargic/keeps eyes closed. Later during session pt reports need to have BM, requires mod assist +1 to ambulate to bathroom, mod assist +2 to ambulate back to bed.   Stairs             Wheelchair Mobility     Tilt Bed    Modified Rankin (Stroke Patients Only)       Balance   Sitting-balance support: No upper extremity supported Sitting balance-Leahy Scale: Fair     Standing balance support: During functional activity, Bilateral upper extremity supported, Single extremity supported Standing balance-Leahy Scale: Poor                              Cognition Arousal:  (difficult to assess, pt lethargic with poor responses to PT but then opens eyes & participates, even walking, before becoming lethargic again) Behavior During  Therapy: Flat affect Overall Cognitive Status: Difficult to assess                                 General Comments: Pt very lethargic, poor ability to respond to PT, when PT about to assist pt back to bed pt becomes more alert, even walking, before becoming lethargic again. Pt with flat affect throughout, one time does raise fist but then lowers it & chuckles. Reports need to have BM but requires MAX cuing to sit on toilet as pt  initially attempting to step into shower & sit on BSC stored there. Pt also with very poor ability to follow simple commands such as "sit down" requiring multimodal cuing & facilitation to complete.        Exercises      General Comments General comments (skin integrity, edema, etc.): Pt with continent void on toilet. Pt with very poor ability to manage secretions, PT utilized yaunker during session.      Pertinent Vitals/Pain Pain Assessment Pain Assessment: PAINAD Breathing: normal Negative Vocalization: none Facial Expression: smiling or inexpressive Body Language: relaxed Consolability: no need to console PAINAD Score: 0    Home Living                          Prior Function            PT Goals (current goals can now be found in the care plan section) Acute Rehab PT Goals Patient Stated Goal: none stated PT Goal Formulation: With patient Time For Goal Achievement: 07/05/23 Potential to Achieve Goals: Poor Progress towards PT goals: Progressing toward goals    Frequency    Min 1X/week      PT Plan      Co-evaluation              AM-PAC PT "6 Clicks" Mobility   Outcome Measure  Help needed turning from your back to your side while in a flat bed without using bedrails?: A Lot Help needed moving from lying on your back to sitting on the side of a flat bed without using bedrails?: Total Help needed moving to and from a bed to a chair (including a wheelchair)?: A Lot Help needed standing up from a chair using your arms (e.g., wheelchair or bedside chair)?: A Lot Help needed to walk in hospital room?: Total Help needed climbing 3-5 steps with a railing? : Total 6 Click Score: 9    End of Session   Activity Tolerance: Patient limited by fatigue Patient left: in bed;with call bell/phone within reach;with bed alarm set (BUE wrist restraints donned, telesitter in room) Nurse Communication: Mobility status PT Visit Diagnosis: Unsteadiness on  feet (R26.81);Muscle weakness (generalized) (M62.81);Other abnormalities of gait and mobility (R26.89);Difficulty in walking, not elsewhere classified (R26.2)     Time: 1610-9604 PT Time Calculation (min) (ACUTE ONLY): 23 min  Charges:    $Therapeutic Activity: 23-37 mins PT General Charges $$ ACUTE PT VISIT: 1 Visit                     Aleda Grana, PT, DPT 06/23/23, 3:36 PM   Sandi Mariscal 06/23/2023, 3:35 PM

## 2023-06-23 NOTE — Progress Notes (Addendum)
PHARMACY - ANTICOAGULATION CONSULT NOTE  Pharmacy Consult for heparin infusion Indication: stroke and cardiomyopathy with low EF  Allergies  Allergen Reactions   Codeine    Shellfish Allergy     Patient Measurements: Height: 5\' 4"  (162.6 cm) Weight: 60 kg (132 lb 4.4 oz) IBW/kg (Calculated) : 59.2 Heparin Dosing Weight: 60kg  Vital Signs: Temp: 97.7 F (36.5 C) (02/03 0402) Temp Source: Axillary (02/03 0402) BP: 197/101 (02/03 0402) Pulse Rate: 104 (02/03 0402)  Labs: Recent Labs    06/21/23 0016 06/21/23 0017 06/21/23 0837 06/22/23 1251 06/22/23 2133 06/23/23 0619  HGB 13.0   < > 12.9* 13.4  --  14.4  HCT 40.8   < > 40.9 41.2  --  44.2  PLT 266  --  266 289  --  272  APTT 27  --   --   --  43* 56*  LABPROT 13.5  --   --   --   --   --   INR 1.0  --   --   --   --   --   HEPARINUNFRC  --   --   --   --  0.46 0.50  CREATININE 1.05   < > 1.05 1.31*  --  1.07   < > = values in this interval not displayed.    Estimated Creatinine Clearance: 47.6 mL/min (by C-G formula based on SCr of 1.07 mg/dL).   Medical History: Past Medical History:  Diagnosis Date   Arthritis    Hypertension     Medications:  Scheduled:   atorvastatin  80 mg Oral Daily   carvedilol  12.5 mg Oral BID WC   finasteride  5 mg Oral Daily   pantoprazole  40 mg Oral Daily   tamsulosin  0.4 mg Oral Daily    Assessment: 78 yo M presented with acute confusion and speech difficulty. PMH significant for previous stroke. Not on anticoagulation PTA. Found to have acute CVA on MRI. On echo, patient has hypokinesis of LV and severely decreased LV function. Pharmacy consulted to manage heparin infusion.  Per neurology, will start heparin infusion for stroke and low LVEF. Plavix and aspirin discontinued. Will not bolus given recent CVA and will use reduced therapeutic goal. Checking aptt with recent enoxaparin administration (40 mg at 0858 today).  Initial aPTT 43, not correlating with heparin level  0.46. No issues with infusion or overt s/sx of bleeding reported. Will use aPTT for dosing given the variance between levels.   2/3 AM update: HL 0.5 aPTT 56 seconds Hgb / PLT wnl No signs of bleeding or issues with heparin infusion per nursing   Goal of Therapy:  Heparin level 0.3-0.5 units/ml aPTT 66-85 seconds Monitor platelets by anticoagulation protocol: Yes   Plan:  Increase heparin infusion to 900 units/hr Check 8 hour aptt Monitor daily heparin level, aptt, and CBC Monitor for signs/symptoms of bleeding F/u transition to oral anticoagulant  Avedis Bevis BS, PharmD, BCPS Clinical Pharmacist 06/23/2023 7:37 AM  Contact: [phone] after 3 PM  "Be curious, not judgmental..." -Debbora Dus

## 2023-06-23 NOTE — Progress Notes (Signed)
PROGRESS NOTE  Dennis [name] MWU:[id number] DOB: [birth year] DOA: 06/21/2023 PCP: Pcp, No  HPI/Recap of past 24 hours: Dennis [name] is a 78 y.o. male with medical history significant of HLD, BPH, HTN, dementia, rheumatoid arthritis, COPD, Barrett's esophagus, OSA does not use CPAP, history of CVA, presents to [facility] ED vis EMS after significant other noted him to be acutely more confused than his baseline and having speech difficulty. EMS noted him to have (L) facial droop and expressive aphasia as well as hypertensive en route to ED. History was obtained entirely from chart review. In the ED, patient vital signs fairly stable except for uncontrolled BP. Lab work largely unrevealing. CT head negative for acute intracranial abnormality. MRI with acute infarct in the (L) posterior subinsular white matter and chronic microvascular ischemic diease. Neurology consulted.  Of note, patient was noted to be noncompliant with medications.  Patient admitted for further management.     Today, patient awake, alert, with noted aphasia.  Restless, on restraints.   Assessment/Plan: Principal Problem:   CVA (cerebral vascular accident) (HCC) Active Problems:   HLD (hyperlipidemia)   HTN (hypertension)   BPH (benign prostatic hyperplasia)  Acute ischemic infarct Likely cardioembolic MRI showed left insula punctate acute infarcts in right frontal punctate subacute infarct CT head unremarkable ECHO with EF of 20 to 25%, global hypokinesis, moderate aortic valve thickening/calcification BLE Doppler, negative for DVT LDL 112, A1c 5.4 Neurology consulted, given possible cardioembolic stroke, started on IV heparin, currently switched to DOAC until EF is more than 30 to 35% Discontinue home ASA, Plavix, start Lipitor Neurochecks, telemetry SLP- s/p MBS recommend dysphagia 1/pured, honey thick liquids PT/OT- SNF upon discharge  Acute on chronic systolic and diastolic HF Appears compensated, BNP  946 Chest x-ray showed mild cardiomegaly, without congestion Echo showed EF of 20 to 25%, global hypokinesis, previous echo in 2023 showed EF of 45% Cardiology consulted, recommend GDMT regimen and titrate meds, signed off Restart on home Coreg, increased, will add on lisinopril as BP permits as per cardiology Telemetry  Hypertension BP stable Restart home Coreg increased dose, plan to add on lisinopril as per cardiology once BP permits  Hyperlipidemia LDL 112, was on Lipitor increased to 80 mg   BPH Proscar and Flomax  Goals of care discussion Palliative care consulted     Estimated body mass index is 22.71 kg/m as calculated from the following:   Height as of this encounter: 5\' 4"  (1.626 m).   Weight as of this encounter: 60 kg.     Code Status: Full  Family Communication: None at bedside  Disposition Plan: Status is: Inpatient The patient will require care spanning > 2 midnights and should be moved to inpatient because: Level of care      Consultants: Neurology Cardiology  Procedures: None  Antimicrobials: None  DVT prophylaxis: Eliquis   Objective: Vitals:   06/23/23 0402 06/23/23 0700 06/23/23 0800 06/23/23 1104  BP: (!) 197/101 (!) 161/76 (!) 156/72 127/82  Pulse: (!) 104 86  74  Resp: 20 15    Temp: 97.7 F (36.5 C) 97.7 F (36.5 C)  97.8 F (36.6 C)  TempSrc: Axillary Axillary  Oral  SpO2: 99% 98%    Weight:      Height:        Intake/Output Summary (Last 24 hours) at 06/23/2023 1109 Last data filed at 06/23/2023 0407 Gross per 24 hour  Intake 183.08 ml  Output 600 ml  Net -416.92 ml   Ceasar Mons  Weights   06/21/23 0000  Weight: 60 kg    Exam: General: NAD, confused, restless, aphasia, able to follow simple commands Cardiovascular: S1, S2 present Respiratory: CTAB Abdomen: Soft, nontender, nondistended, bowel sounds present Musculoskeletal: No bilateral pedal edema noted Skin: Normal Psychiatry: Fair mood     Data  Reviewed: CBC: Recent Labs  Lab 06/21/23 0016 06/21/23 0017 06/21/23 0837 06/22/23 1251 06/23/23 0619  WBC 7.9  --  7.6 8.1 10.0  NEUTROABS 3.9  --   --   --   --   HGB 13.0 13.6 12.9* 13.4 14.4  HCT 40.8 40.0 40.9 41.2 44.2  MCV 87.9  --  87.8 85.5 85.5  PLT 266  --  266 289 272   Basic Metabolic Panel: Recent Labs  Lab 06/21/23 0016 06/21/23 0017 06/21/23 0837 06/22/23 1251 06/23/23 0619  NA 140 140 138 137 139  K 4.0 4.1 4.3 4.0 4.1  CL 105 105 103 105 107  CO2 26  --  22 23 23   GLUCOSE 100* 94 98 97 102*  BUN 13 14 12 11 13   CREATININE 1.05 1.10 1.05 1.31* 1.07  CALCIUM 10.4*  --  10.0 9.6 9.6  MG  --   --  1.9  --   --    GFR: Estimated Creatinine Clearance: 47.6 mL/min (by C-G formula based on SCr of 1.07 mg/dL). Liver Function Tests: Recent Labs  Lab 06/21/23 0016  AST 18  ALT 13  ALKPHOS 29*  BILITOT 0.4  PROT 6.9  ALBUMIN 3.3*   No results for input(s): "LIPASE", "AMYLASE" in the last 168 hours. No results for input(s): "AMMONIA" in the last 168 hours. Coagulation Profile: Recent Labs  Lab 06/21/23 0016  INR 1.0   Cardiac Enzymes: No results for input(s): "CKTOTAL", "CKMB", "CKMBINDEX", "TROPONINI" in the last 168 hours. BNP (last 3 results) No results for input(s): "PROBNP" in the last 8760 hours. HbA1C: Recent Labs    06/21/23 0837  HGBA1C 5.4   CBG: Recent Labs  Lab 06/21/23 0013 06/21/23 2317  GLUCAP 88 99   Lipid Profile: Recent Labs    06/21/23 0837  CHOL 159  HDL 31*  LDLCALC 112*  TRIG 78  CHOLHDL 5.1   Thyroid Function Tests: No results for input(s): "TSH", "T4TOTAL", "FREET4", "T3FREE", "THYROIDAB" in the last 72 hours. Anemia Panel: No results for input(s): "VITAMINB12", "FOLATE", "FERRITIN", "TIBC", "IRON", "RETICCTPCT" in the last 72 hours. Urine analysis:    Component Value Date/Time   COLORURINE STRAW (A) 06/21/2023 0047   APPEARANCEUR CLEAR 06/21/2023 0047   LABSPEC 1.009 06/21/2023 0047   PHURINE 6.0  06/21/2023 0047   GLUCOSEU NEGATIVE 06/21/2023 0047   HGBUR NEGATIVE 06/21/2023 0047   BILIRUBINUR NEGATIVE 06/21/2023 0047   KETONESUR NEGATIVE 06/21/2023 0047   PROTEINUR NEGATIVE 06/21/2023 0047   NITRITE NEGATIVE 06/21/2023 0047   LEUKOCYTESUR NEGATIVE 06/21/2023 0047   Sepsis Labs: @LABRCNTIP (procalcitonin:4,lacticidven:4)  )No results found for this or any previous visit (from the past 240 hours).    Studies: DG Swallowing Func-Speech Pathology Result Date: 06/22/2023 Table formatting from the original result was not included. Modified Barium Swallow Study Patient Details Name: Dennis [name] MRN: [id number] Date of Birth: Mar 04, 1946 Today's Date: 06/22/2023 HPI/PMH: HPI: Pt is a 78 y.o. male presented to ED after significant other noted him to be acutely more confused than his baseline and having speech difficulty. EMS noted him to have (L) facial droop and expressive aphasia as well as hypertensive en route to ED. MRI (06/21/23)  revealed, "Punctate acute infarct in the left posterior subinsular white matter... small area of acute infarct in the more superior left frontal cortex. There also 2 punctate areas of diffusion hyperintensity without ADC correlate in the superior right frontal lobe which may be areas of subacute ischemia". Previous acute SLP services provided for dysphagia on 03/03/23, recommended advancement of diet to dys 3/thin liquids given improvement in mentation and respiratory status. PMH: HLD, BPH, HTN, dementia, rheumatoid arthritis, COPD, Barrett's esophagus, OSA does not use CPAP, history of CVA. Clinical Impression: Clinical Impression: Pt exhibited significant restlessness with excessive movement, distractibilty and decreased attention during MBS. Oral impairments included decreased labial seal with spillage, slow transit, sub lingual and lingual spillage and significant right buccal cavity/sulci residue. Cup sips provided increased awareness and improved transit than teaspoon  trials. Swallow initiation was delayed with nectar reaching the pyriform sinuses and silently aspirated during the swallow. There was collection of residue in valleculae and pyriform sinuses with thin which was aspirated as he performed a spontaneous second swallow resulting in a hard reflexive cough. Overall residue was not significant with thicker textures. He was unable to attend to or follow commands for compensatory strategies. His laryngeal elevation and epiglottic inversion appeared adequate with inconsistent partial hyoid anterior excursion. Solid texture was not administered and right side of oral cavity required suctioning at end of study. Esophageal scan was unremarkable. Recommend Dys 1 (puree), honey thick liquids, crush meds, place utensil on left side oral cavity, check for pocketing on right side and full assist with meals. Factors that may increase risk of adverse event in presence of aspiration [name] 2021): Factors that may increase risk of adverse event in presence of aspiration [name] 2021): Reduced cognitive function Recommendations/Plan: Swallowing Evaluation Recommendations Swallowing Evaluation Recommendations Recommendations: PO diet PO Diet Recommendation: Dysphagia 1 (Pureed); Moderately thick liquids (Level 3, honey thick) Liquid Administration via: Cup Medication Administration: Crushed with puree Swallowing strategies  : Small bites/sips; Slow rate; Check for pocketing or oral holding; Check for anterior loss Postural changes: Position pt fully upright for meals Oral care recommendations: Oral care BID (2x/day) Treatment Plan Treatment Plan Treatment recommendations: Therapy as outlined in treatment plan below Follow-up recommendations: Skilled nursing-short term rehab (<3 hours/day) Functional status assessment: Patient has had a recent decline in their functional status and demonstrates the ability to make significant improvements in function in a reasonable and  predictable amount of time. Treatment frequency: Min 2x/week Treatment duration: 2 weeks Interventions: Patient/family education; Trials of upgraded texture/liquids; Diet toleration management by SLP Recommendations Recommendations for follow up therapy are one component of a multi-disciplinary discharge planning process, led by the attending physician.  Recommendations may be updated based on patient status, additional functional criteria and insurance authorization. Assessment: Orofacial Exam: Orofacial Exam Oral Cavity: Oral Hygiene: WFL Oral Cavity - Dentition: Poor condition; Missing dentition Oral Motor/Sensory Function: Suspected cranial nerve impairment CN V - Trigeminal: Right motor impairment; Right sensory impairment CN VII - Facial: Right motor impairment Anatomy: Anatomy: WFL Boluses Administered: Boluses Administered Boluses Administered: Thin liquids (Level 0); Mildly thick liquids (Level 2, nectar thick); Moderately thick liquids (Level 3, honey thick); Puree  Oral Impairment Domain: Oral Impairment Domain Lip Closure: Escape progressing to mid-chin Tongue control during bolus hold: Escape to lateral buccal cavity/floor of mouth Bolus preparation/mastication: -- (not administer solids) Bolus transport/lingual motion: Slow tongue motion Oral residue: Residue collection on oral structures Location of oral residue : Floor of mouth; Tongue; Lateral sulci Initiation of  pharyngeal swallow : Pyriform sinuses  Pharyngeal Impairment Domain: Pharyngeal Impairment Domain Soft palate elevation: No bolus between soft palate (SP)/pharyngeal wall (PW) Laryngeal elevation: Complete superior movement of thyroid cartilage with complete approximation of arytenoids to epiglottic petiole Anterior hyoid excursion: Partial anterior movement Epiglottic movement: Complete inversion Laryngeal vestibule closure: Incomplete, narrow column air/contrast in laryngeal vestibule Pharyngeal stripping wave : Present - complete  Pharyngeal contraction (A/P view only): N/A Pharyngoesophageal segment opening: Complete distension and complete duration, no obstruction of flow Tongue base retraction: No contrast between tongue base and posterior pharyngeal wall (PPW) Pharyngeal residue: Collection of residue within or on pharyngeal structures (with thin in valleculae) Location of pharyngeal residue: Valleculae; Pyriform sinuses (with thin)  Esophageal Impairment Domain: Esophageal Impairment Domain Esophageal clearance upright position: Complete clearance, esophageal coating Pill: No data recorded Penetration/Aspiration Scale Score: Penetration/Aspiration Scale Score 1.  Material does not enter airway: Puree; Moderately thick liquids (Level 3, honey thick) 7.  Material enters airway, passes BELOW cords and not ejected out despite cough attempt by patient: Thin liquids (Level 0) 8.  Material enters airway, passes BELOW cords without attempt by patient to eject out (silent aspiration) : Mildly thick liquids (Level 2, nectar thick) Compensatory Strategies: Compensatory Strategies Compensatory strategies: No (pt unable due to aphasia and poor attention)   General Information: Caregiver present: No  Diet Prior to this Study: Other (Comment) (NPO except meds crushed in applesauce)   Temperature : Normal   Respiratory Status: WFL   Supplemental O2: None (Room air)   History of Recent Intubation: No  Behavior/Cognition: Impulsive (restless) Self-Feeding Abilities: Needs assist with self-feeding Baseline vocal quality/speech: Hypophonia/low volume Volitional Cough: Unable to elicit Volitional Swallow: Unable to elicit Exam Limitations: Excessive movement Goal Planning: Prognosis for improved oropharyngeal function: Fair Barriers to Reach Goals: Cognitive deficits; Other (Comment); Severity of deficits (aphasia) No data recorded Patient/Family Stated Goal: none stated Consulted and agree with results and recommendations: Pt unable/family or caregiver not  available (pt unable and wife with difficulty comprehending information - has dementia per neighbors) Pain: Pain Assessment Pain Assessment: Faces Faces Pain Scale: 0 Breathing: 0 Negative Vocalization: 0 Facial Expression: 0 Body Language: 0 Consolability: 0 PAINAD Score: 0 End of Session: Start Time:SLP Start Time (ACUTE ONLY): 1354 Stop Time: SLP Stop Time (ACUTE ONLY): 1411 Time Calculation:SLP Time Calculation (min) (ACUTE ONLY): 17 min Charges: SLP Evaluations $ SLP Speech Visit: 1 Visit SLP Evaluations $BSS Swallow: 1 Procedure $MBS Swallow: 1 Procedure $ SLP EVAL LANGUAGE/SOUND PRODUCTION: 1 Procedure $Swallowing Treatment: 1 Procedure SLP visit diagnosis: SLP Visit Diagnosis: Dysphagia, oropharyngeal phase (R13.12) Past Medical History: Past Medical History: Diagnosis Date  Arthritis   Hypertension  Past Surgical History: Past Surgical History: Procedure Laterality Date  CHOLECYSTECTOMY    THROAT SURGERY   Royce Macadamia 06/22/2023, 3:32 PM   Scheduled Meds:  apixaban  5 mg Oral BID   atorvastatin  80 mg Oral Daily   carvedilol  12.5 mg Oral BID WC   finasteride  5 mg Oral Daily   pantoprazole  40 mg Oral Daily   tamsulosin  0.4 mg Oral Daily    Continuous Infusions:     LOS: 1 day     Briant Cedar, MD [facility]  If 7PM-7AM, please contact night-coverage [email or web address] 06/23/2023, 11:09 AM

## 2023-06-23 NOTE — Progress Notes (Signed)
Heart Failure Navigator Progress Note  Assessed for Heart & Vascular TOC clinic readiness.  Patient does not meet criteria due to Dementia history per MD note. .   Navigator will sign off at this time.   Rhae Hammock, BSN, Scientist, clinical (histocompatibility and immunogenetics) Only

## 2023-06-23 NOTE — Progress Notes (Signed)
Speech Language Pathology Treatment: Dysphagia  Patient Details Name: Dennis [name] MRN: [id number] DOB: Nov 23, 1945 Today's Date: 06/23/2023 Time: 4098-1191 SLP Time Calculation (min) (ACUTE ONLY): 11 min  Assessment / Plan / Recommendation Clinical Impression  Pt reluctant to participate today - generally lethargic, when he would awaken he was resistant to eating and to oral care.  Did not follow commands.  Accepted spoon of honey-thick but did not swallow; poor awareness of POs.  Oral care stimulated spontaneous swallowing; suctioning removed mucous from roof of mouth/tongue.  He became upset and starting talking, but speech was not intelligible. He became drowsy and fell back to sleep.  Discontinued efforts to engage in therapy.    Hold dinner tray if he is not alert.  SLP will follow closely.     HPI HPI: Pt is a 78 y.o. male presented to ED after significant other noted him to be acutely more confused than his baseline and having speech difficulty. EMS noted him to have (L) facial droop and expressive aphasia as well as hypertensive en route to ED. MRI (06/21/23) revealed, "Punctate acute infarct in the left posterior subinsular white matter... small area of acute infarct in the more superior left frontal cortex. There also 2 punctate areas of diffusion hyperintensity without ADC correlate in the superior right frontal lobe which may be areas of subacute ischemia". Previous acute SLP services provided for dysphagia on 03/03/23, recommended advancement of diet to dys 3/thin liquids given improvement in mentation and respiratory status. PMH: HLD, BPH, HTN, dementia, rheumatoid arthritis, COPD, Barrett's esophagus, OSA does not use CPAP, history of CVA.      SLP Plan  Continue with current plan of care      Recommendations for follow up therapy are one component of a multi-disciplinary discharge planning process, led by the attending physician.  Recommendations may be updated based on patient  status, additional functional criteria and insurance authorization.    Recommendations  Diet recommendations: Dysphagia 1 (puree);Honey-thick liquid Liquids provided via: Cup Medication Administration: Crushed with puree Supervision: Staff to assist with self feeding Compensations: Slow rate;Small sips/bites Postural Changes and/or Swallow Maneuvers: Seated upright 90 degrees                  Oral care QID   Frequent or constant Supervision/Assistance Dysphagia, oropharyngeal phase (R13.12)     Continue with current plan of care    Prajwal Fellner L. Samson Frederic, MA CCC/SLP Clinical Specialist - Acute Care SLP Acute Rehabilitation Services Office number [phone]  Blenda Mounts Laurice  06/23/2023, 4:04 PM

## 2023-06-24 DIAGNOSIS — F03918 Unspecified dementia, unspecified severity, with other behavioral disturbance: Secondary | ICD-10-CM | POA: Diagnosis not present

## 2023-06-24 DIAGNOSIS — I63413 Cerebral infarction due to embolism of bilateral middle cerebral arteries: Secondary | ICD-10-CM | POA: Diagnosis not present

## 2023-06-24 MED ORDER — LISINOPRIL 2.5 MG PO TABS
5.0000 mg | ORAL_TABLET | Freq: Every day | ORAL | Status: DC
  Administered 2023-06-24 – 2023-06-25 (×2): 5 mg via ORAL
  Filled 2023-06-24 (×2): qty 2

## 2023-06-24 MED ORDER — QUETIAPINE FUMARATE 25 MG PO TABS: 25.0000 mg | ORAL_TABLET | Freq: Every evening | ORAL | Status: DC | PRN

## 2023-06-24 NOTE — Plan of Care (Signed)
 Has been more alert today.  More talkative and interactive.  Does get anxious and agitated for no apparent reason then attempts to pull at IV line or tries to get out of bed.   Problem: Activity: Goal: Risk for activity intolerance will decrease Outcome: Progressing   Problem: Coping: Goal: Level of anxiety will decrease Outcome: Progressing   Problem: Pain Managment: Goal: General experience of comfort will improve and/or be controlled Outcome: Progressing   Problem: Safety: Goal: Ability to remain free from injury will improve Outcome: Progressing   Problem: Skin Integrity: Goal: Risk for impaired skin integrity will decrease Outcome: Progressing   Problem: Safety: Goal: Non-violent Restraint(s) Outcome: Progressing

## 2023-06-24 NOTE — Progress Notes (Signed)
 Physical Therapy Treatment Patient Details Name: Dennis [name] MRN: [id number] DOB: [birth year] Today's Date: 06/24/2023   History of Present Illness The pt is a 78 yo male presenting 2/1 from home with concern for stroke (slurred speech and mild L facial droop). MRI showed:  small area of acute infarct in the more superior left frontal cortex and 2 areas of subacute ischemia in superior R frontal lobe. PMH includes: dementia, arthritis, HTN, HLD, RA, COPD, OSA, and CVA.    PT Comments  Patient alert on arrival. Speaking throughout session, however unintelligible. Able to progress ambulation distance with 2nd person for monitor and safety (confusion). Pt consistently running into objects on his right even though he had head turned slightly to his right. Easily managed walking back to room, however once seated EOB, pt did not want to return to supine. Ultimately +2 assist to move to supine.     If plan is discharge home, recommend the following: Direct supervision/assist for medications management;Help with stairs or ramp for entrance;Assist for transportation;Assistance with feeding;Direct supervision/assist for financial management;Assistance with cooking/housework;Supervision due to cognitive status;A little help with walking and/or transfers   Can travel by private vehicle     Yes  Equipment Recommendations  Other (comment) (defer to next venue)    Recommendations for Other Services       Precautions / Restrictions Precautions Precautions: Fall Precaution Comments: watch BP, wrist restraints Restrictions Weight Bearing Restrictions Per Provider Order: No     Mobility  Bed Mobility Overal bed mobility: Needs Assistance Bed Mobility: Rolling, Sidelying to Sit, Sit to Supine Rolling: Mod assist Sidelying to sit: Min assist   Sit to supine: Mod assist, HOB elevated, +2 for physical assistance   General bed mobility comments: Able to follow commands for coming to sit on rt EOB; on  return to bed, incr assist as pt wanted to stay sitting on EOB and could not persuade him to lie down; +2 to scoot to [facility]    Transfers Overall transfer level: Needs assistance Equipment used: 1 person hand held assist Transfers: Sit to/from Stand Sit to Stand: Min assist           General transfer comment: STS from EOB    Ambulation/Gait Ambulation/Gait assistance: Min assist, +2 safety/equipment Gait Distance (Feet): 80 Feet Assistive device: 1 person hand held assist Gait Pattern/deviations: Step-to pattern, Decreased stride length, Trunk flexed, Narrow base of support, Drifts right/left Gait velocity: decreased     General Gait Details: Drifts to right with decr awareness of objects on his right.   Stairs             Wheelchair Mobility     Tilt Bed    Modified Rankin (Stroke Patients Only) Modified Rankin (Stroke Patients Only) Pre-Morbid Rankin Score: Moderate disability Modified Rankin: Moderately severe disability     Balance Overall balance assessment: Needs assistance Sitting-balance support: No upper extremity supported Sitting balance-Leahy Scale: Fair   Postural control: Posterior lean Standing balance support: During functional activity, Bilateral upper extremity supported, Single extremity supported Standing balance-Leahy Scale: Poor                              Cognition Arousal: Alert (difficult to assess, pt lethargic with poor responses to PT but then opens eyes & participates, even walking, before becoming lethargic again) Behavior During Therapy: Flat affect Overall Cognitive Status: Difficult to assess  General Comments: speech is very unintelligible        Exercises      General Comments General comments (skin integrity, edema, etc.): Easily able to get him to go back to his room. Not so easy to get him to return to supine in bed.      Pertinent Vitals/Pain Pain  Assessment Pain Assessment: PAINAD Faces Pain Scale: No hurt Breathing: normal Negative Vocalization: none Facial Expression: smiling or inexpressive Body Language: relaxed Consolability: no need to console PAINAD Score: 0    Home Living                          Prior Function            PT Goals (current goals can now be found in the care plan section) Acute Rehab PT Goals Patient Stated Goal: none stated Time For Goal Achievement: 07/05/23 Potential to Achieve Goals: Poor Progress towards PT goals: Progressing toward goals    Frequency    Min 1X/week      PT Plan      Co-evaluation              AM-PAC PT 6 Clicks Mobility   Outcome Measure  Help needed turning from your back to your side while in a flat bed without using bedrails?: A Lot Help needed moving from lying on your back to sitting on the side of a flat bed without using bedrails?: A Little Help needed moving to and from a bed to a chair (including a wheelchair)?: A Little Help needed standing up from a chair using your arms (e.g., wheelchair or bedside chair)?: A Little Help needed to walk in hospital room?: Total Help needed climbing 3-5 steps with a railing? : Total 6 Click Score: 13    End of Session Equipment Utilized During Treatment: Gait belt Activity Tolerance: Patient tolerated treatment well Patient left: in bed;with call bell/phone within reach;with bed alarm set;with restraints reapplied (BUE wrist restraints donned, telesitter in room) Nurse Communication: Mobility status PT Visit Diagnosis: Unsteadiness on feet (R26.81);Muscle weakness (generalized) (M62.81);Other abnormalities of gait and mobility (R26.89);Difficulty in walking, not elsewhere classified (R26.2)     Time: 8674-8654 PT Time Calculation (min) (ACUTE ONLY): 20 min  Charges:    $Gait Training: 8-22 mins PT General Charges $$ ACUTE PT VISIT: 1 Visit                      Macario RAMAN, PT Acute  Rehabilitation Services  Office [phone]    Macario SHAUNNA Soja 06/24/2023, 2:21 PM

## 2023-06-24 NOTE — Consult Note (Signed)
 Consultation Note Date: 06/24/2023   Patient Name: Dennis [name]  DOB: 07-Jun-1945  MRN: [id number]  Age / Sex: 78 y.o., male  PCP: Pcp, No Referring Physician: Donnamarie Lebron PARAS, MD  Reason for Consultation:  GOC discussion, hx of dementia, now with EF of 25% with a new stroke  HPI/Patient Profile: 78 y.o. male  with past medical history of dementia with aggressive behaviors, COPD, BPH, rheumatoid arthritis admitted on 06/21/2023 with stroke.    Primary Decision Maker OTHER - patient has a significant other, however, she is not able to make decisions for patient due to cognitive issues, DSS is seeking guardianship- currently decision making falls to medical providers  Discussion: Chart reviewed including labs, progress notes, imaging from this and previous encounters.  Patient continues to have significant altered mental status, not following commands. Severely dysarthric with unintelligible speech. He is on dysphagia 3 diet.  On evaluation patient receiving personal care. Discussed with attending provider and social worker via secure chat.     SUMMARY OF RECOMMENDATIONS -Continue current care -Patient is unable to participate in goals of care discussion -DSS is seeking guardianship -If urgent decision making is needed, then per  law the patient's attending providers are medical decision makers:   GS 90-21.13 (c1)  If none of the persons listed under subsection (c) of this section is reasonably available, then the patient's attending physician, in the attending physician's discretion, may provide health care treatment without the consent of the patient or other person authorized to consent for the patient if there is confirmation by a physician other than the patient's attending physician of the patient's condition and the necessity for treatment; provided, however, that confirmation of the  patient's condition and the necessity for treatment are not required if the delay in obtaining the confirmation would endanger the life or seriously worsen the condition of the Patient.   [email or web address]  -Palliative will followup when a decision maker has been appointed  Code Status/Advance Care Planning: Full code   Prognosis:   Unable to determine  Discharge Planning: To Be Determined  Primary Diagnoses: Present on Admission:  CVA (cerebral vascular accident) (HCC)  HLD (hyperlipidemia)  HTN (hypertension)   Review of Systems  Unable to perform ROS   Physical Exam Vitals and nursing note reviewed.     Vital Signs: BP (!) 147/82 (BP Location: Right Arm)   Pulse 83   Temp 97.6 F (36.4 C) (Oral)   Resp 17   Ht 5' 4 (1.626 m)   Wt 60 kg   SpO2 94%   BMI 22.71 kg/m  Pain Scale: Faces   Pain Score: 0-No pain   SpO2: SpO2: 94 % O2 Device:SpO2: 94 % O2 Flow Rate: .   IO: Intake/output summary:  Intake/Output Summary (Last 24 hours) at 06/24/2023 0939 Last data filed at 06/24/2023 0121 Gross per 24 hour  Intake 60 ml  Output 150 ml  Net -90 ml    LBM: Last BM Date : 06/23/23 Baseline Weight: Weight: 60 kg Most recent  weight: Weight: 60 kg       Thank you for this consult. Palliative medicine will continue to follow and assist as needed.  Time Total: 60 minutes Signed by: Cassondra Stain, AGNP-C Palliative Medicine  Time includes:   Preparing to see the patient (e.g., review of tests) Obtaining and/or reviewing separately obtained history Performing a medically necessary appropriate examination and/or evaluation Counseling and educating the patient/family/caregiver Ordering medications, tests, or procedures Referring and communicating with other health care professionals (when not reported separately) Documenting clinical information in the electronic or other health  record Independently interpreting results (not reported separately) and communicating results to the patient/family/caregiver Care coordination (not reported separately) Clinical documentation   Please contact Palliative Medicine Team phone at [phone] for questions and concerns.  For individual provider: See Tracey

## 2023-06-24 NOTE — Plan of Care (Signed)
  Problem: Clinical Measurements: Goal: Ability to maintain clinical measurements within normal limits will improve Outcome: Progressing Goal: Will remain free from infection Outcome: Progressing   Problem: Coping: Goal: Level of anxiety will decrease Outcome: Progressing   Problem: Elimination: Goal: Will not experience complications related to urinary retention Outcome: Progressing   Problem: Pain Managment: Goal: General experience of comfort will improve and/or be controlled Outcome: Progressing   Problem: Skin Integrity: Goal: Risk for impaired skin integrity will decrease Outcome: Progressing

## 2023-06-24 NOTE — Progress Notes (Signed)
 PROGRESS NOTE  Dennis [name] FMW:[id number] DOB: 05-31-1945 DOA: 06/21/2023 PCP: Pcp, No  HPI/Recap of past 24 hours: Dennis [name] is a 78 y.o. male with medical history significant of HLD, BPH, HTN, dementia, rheumatoid arthritis, COPD, Barrett's esophagus, OSA does not use CPAP, history of CVA, presents to [facility] ED vis EMS after significant other noted him to be acutely more confused than his baseline and having speech difficulty. EMS noted him to have (L) facial droop and expressive aphasia as well as hypertensive en route to ED. History was obtained entirely from chart review. In the ED, patient vital signs fairly stable except for uncontrolled BP. Lab work largely unrevealing. CT head negative for acute intracranial abnormality. MRI with acute infarct in the (L) posterior subinsular white matter and chronic microvascular ischemic diease. Neurology consulted.  Of note, patient was noted to be noncompliant with medications.  Patient admitted for further management.      Today, pt asleep, calm in bed, still requiring restraints. DSS working to seek guardianship as patients spouse has dementia as well. SNF upon discharge, but has to have guardianship before snf discharge.   Assessment/Plan: Principal Problem:   CVA (cerebral vascular accident) (HCC) Active Problems:   HLD (hyperlipidemia)   HTN (hypertension)   BPH (benign prostatic hyperplasia)  Acute ischemic infarct Likely cardioembolic MRI showed left insula punctate acute infarcts in right frontal punctate subacute infarct CT head unremarkable ECHO with EF of 20 to 25%, global hypokinesis, moderate aortic valve thickening/calcification BLE Doppler, negative for DVT LDL 112, A1c 5.4 Neurology consulted, given possible cardioembolic stroke, started on IV heparin , currently switched to DOAC until EF is more than 30 to 35% Discontinue home ASA, Plavix , continue Eliquis  and Lipitor  Neurochecks, telemetry SLP- s/p MBS recommend  dysphagia 1/pured, honey thick liquids PT/OT- SNF upon discharge  Acute on chronic systolic and diastolic HF Appears compensated, BNP 946 Chest x-ray showed mild cardiomegaly, without congestion Echo showed EF of 20 to 25%, global hypokinesis, previous echo in 2023 showed EF of 45% Cardiology consulted, recommend GDMT regimen and titrate meds, signed off Restart on home Coreg , increased, will add on lisinopril  as BP permits as per cardiology Telemetry  Hypertension BP stable Restart home Coreg  increased do, lisinopril  as per cardiology  Hyperlipidemia LDL 112, was on Lipitor  increased to 80 mg   BPH Proscar  and Flomax   Goals of care discussion Palliative care consulted     Estimated body mass index is 22.71 kg/m as calculated from the following:   Height as of this encounter: 5' 4 (1.626 m).   Weight as of this encounter: 60 kg.     Code Status: Full  Family Communication: None at bedside  Disposition Plan: Status is: Inpatient The patient will require care spanning > 2 midnights and should be moved to inpatient because: Level of care      Consultants: Neurology Cardiology  Procedures: None  Antimicrobials: None  DVT prophylaxis: Eliquis    Objective: Vitals:   06/24/23 0012 06/24/23 0401 06/24/23 0733 06/24/23 1111  BP: (!) 169/91 (!) 140/95 (!) 147/82 130/78  Pulse: 81 69 83 77  Resp: 18 18 17 20   Temp: 98.5 F (36.9 C) 98.1 F (36.7 C) 97.6 F (36.4 C) 98.8 F (37.1 C)  TempSrc: Oral Oral Oral Oral  SpO2: 95% 94%  96%  Weight:      Height:        Intake/Output Summary (Last 24 hours) at 06/24/2023 1120 Last data filed at 06/24/2023 0121  Gross per 24 hour  Intake 60 ml  Output 150 ml  Net -90 ml   Filed Weights   06/21/23 0000  Weight: 60 kg    Exam: General: NAD, aphasia, able to follow simple commands Cardiovascular: S1, S2 present Respiratory: CTAB Abdomen: Soft, nontender, nondistended, bowel sounds present Musculoskeletal:  No bilateral pedal edema noted Skin: Normal Psychiatry: Fair mood     Data Reviewed: CBC: Recent Labs  Lab 06/21/23 0016 06/21/23 0017 06/21/23 0837 06/22/23 1251 06/23/23 0619  WBC 7.9  --  7.6 8.1 10.0  NEUTROABS 3.9  --   --   --   --   HGB 13.0 13.6 12.9* 13.4 14.4  HCT 40.8 40.0 40.9 41.2 44.2  MCV 87.9  --  87.8 85.5 85.5  PLT 266  --  266 289 272   Basic Metabolic Panel: Recent Labs  Lab 06/21/23 0016 06/21/23 0017 06/21/23 0837 06/22/23 1251 06/23/23 0619  NA 140 140 138 137 139  K 4.0 4.1 4.3 4.0 4.1  CL 105 105 103 105 107  CO2 26  --  22 23 23   GLUCOSE 100* 94 98 97 102*  BUN 13 14 12 11 13   CREATININE 1.05 1.10 1.05 1.31* 1.07  CALCIUM  10.4*  --  10.0 9.6 9.6  MG  --   --  1.9  --   --    GFR: Estimated Creatinine Clearance: 47.6 mL/min (by C-G formula based on SCr of 1.07 mg/dL). Liver Function Tests: Recent Labs  Lab 06/21/23 0016  AST 18  ALT 13  ALKPHOS 29*  BILITOT 0.4  PROT 6.9  ALBUMIN 3.3*   No results for input(s): LIPASE, AMYLASE in the last 168 hours. No results for input(s): AMMONIA in the last 168 hours. Coagulation Profile: Recent Labs  Lab 06/21/23 0016  INR 1.0   Cardiac Enzymes: No results for input(s): CKTOTAL, CKMB, CKMBINDEX, TROPONINI in the last 168 hours. BNP (last 3 results) No results for input(s): PROBNP in the last 8760 hours. HbA1C: No results for input(s): HGBA1C in the last 72 hours.  CBG: Recent Labs  Lab 06/21/23 0013 06/21/23 2317  GLUCAP 88 99   Lipid Profile: No results for input(s): CHOL, HDL, LDLCALC, TRIG, CHOLHDL, LDLDIRECT in the last 72 hours.  Thyroid  Function Tests: No results for input(s): TSH, T4TOTAL, FREET4, T3FREE, THYROIDAB in the last 72 hours. Anemia Panel: No results for input(s): VITAMINB12, FOLATE, FERRITIN, TIBC, IRON, RETICCTPCT in the last 72 hours. Urine analysis:    Component Value Date/Time   COLORURINE STRAW  (A) 06/21/2023 0047   APPEARANCEUR CLEAR 06/21/2023 0047   LABSPEC 1.009 06/21/2023 0047   PHURINE 6.0 06/21/2023 0047   GLUCOSEU NEGATIVE 06/21/2023 0047   HGBUR NEGATIVE 06/21/2023 0047   BILIRUBINUR NEGATIVE 06/21/2023 0047   KETONESUR NEGATIVE 06/21/2023 0047   PROTEINUR NEGATIVE 06/21/2023 0047   NITRITE NEGATIVE 06/21/2023 0047   LEUKOCYTESUR NEGATIVE 06/21/2023 0047   Sepsis Labs: @LABRCNTIP (procalcitonin:4,lacticidven:4)  )No results found for this or any previous visit (from the past 240 hours).    Studies: No results found.   Scheduled Meds:  apixaban   5 mg Oral BID   atorvastatin   80 mg Oral Daily   carvedilol   6.25 mg Oral BID WC   finasteride   5 mg Oral Daily   lisinopril   5 mg Oral Daily   pantoprazole   40 mg Oral Daily   tamsulosin   0.4 mg Oral Daily    Continuous Infusions:     LOS: 2 days  Lebron JINNY Cage, MD [facility]  If 7PM-7AM, please contact night-coverage [email or web address] 06/24/2023, 11:20 AM

## 2023-06-25 DIAGNOSIS — F03918 Unspecified dementia, unspecified severity, with other behavioral disturbance: Secondary | ICD-10-CM | POA: Diagnosis not present

## 2023-06-25 DIAGNOSIS — I63413 Cerebral infarction due to embolism of bilateral middle cerebral arteries: Secondary | ICD-10-CM | POA: Diagnosis not present

## 2023-06-25 LAB — CBC WITH DIFFERENTIAL/PLATELET
Abs Immature Granulocytes: 0.06 10*3/uL (ref 0.00–0.07)
Basophils Absolute: 0 10*3/uL (ref 0.0–0.1)
Basophils Relative: 0 %
Eosinophils Absolute: 0.2 10*3/uL (ref 0.0–0.5)
Eosinophils Relative: 1 %
HCT: 41.1 % (ref 39.0–52.0)
Hemoglobin: 13.2 g/dL (ref 13.0–17.0)
Immature Granulocytes: 1 %
Lymphocytes Relative: 19 %
Lymphs Abs: 2.1 10*3/uL (ref 0.7–4.0)
MCH: 27.8 pg (ref 26.0–34.0)
MCHC: 32.1 g/dL (ref 30.0–36.0)
MCV: 86.5 fL (ref 80.0–100.0)
Monocytes Absolute: 1.4 10*3/uL — ABNORMAL HIGH (ref 0.1–1.0)
Monocytes Relative: 12 %
Neutro Abs: 7.5 10*3/uL (ref 1.7–7.7)
Neutrophils Relative %: 67 %
Platelets: 286 10*3/uL (ref 150–400)
RBC: 4.75 MIL/uL (ref 4.22–5.81)
RDW: 15.9 % — ABNORMAL HIGH (ref 11.5–15.5)
WBC: 11.3 10*3/uL — ABNORMAL HIGH (ref 4.0–10.5)
nRBC: 0 % (ref 0.0–0.2)

## 2023-06-25 LAB — BASIC METABOLIC PANEL
Anion gap: 9 (ref 5–15)
BUN: 32 mg/dL — ABNORMAL HIGH (ref 8–23)
CO2: 23 mmol/L (ref 22–32)
Calcium: 9.3 mg/dL (ref 8.9–10.3)
Chloride: 107 mmol/L (ref 98–111)
Creatinine, Ser: 1.89 mg/dL — ABNORMAL HIGH (ref 0.61–1.24)
GFR, Estimated: 36 mL/min — ABNORMAL LOW (ref >60)
Glucose, Bld: 105 mg/dL — ABNORMAL HIGH (ref 70–99)
Potassium: 3.7 mmol/L (ref 3.5–5.1)
Sodium: 139 mmol/L (ref 135–145)

## 2023-06-25 MED ORDER — SODIUM CHLORIDE 0.9 % IV SOLN: INTRAVENOUS | Status: AC

## 2023-06-25 NOTE — Care Management Important Message (Signed)
 Important Message  Patient Details  Name: Dennis [name] MRN: [id number] Date of Birth: 1945-08-13   Important Message Given:  Yes - Medicare IM     Wynonia Hedges 06/25/2023, 2:44 PM

## 2023-06-25 NOTE — Progress Notes (Signed)
 Speech Language Pathology Treatment: Cognitive-Linquistic;Dysphagia  Patient Details Name: Dennis [name] MRN: [id number] DOB: June 23, 1945 Today's Date: 06/25/2023 Time: 9064-9051 SLP Time Calculation (min) (ACUTE ONLY): 13 min  Assessment / Plan / Recommendation Clinical Impression  Dennis [name] was lethargic/sleepy this morning, did not follow commands to open eyes or additional basic 1 step commands. He did not elicit spontaneous speech this morning but responded to therapist with head nod x 1. He opened mouth with spoon placed to lips with mild oral delays and instance of holding at end of session due to falling asleep and feeding stopped. Mild anterior spill and residue on lips with decreased awareness. Honey thick presented with spoon and cup sips with increased coughing with cup possibly due to increased volume in combination with decreased alertness.He consumed approximately 5 bites puree pancake mixed with syrup and did not have oral residue this session. Recommend therapist/staff use spoon with liquids for now (RN states she has been using spoon due to right labial leakage). Continue puree/honey thick, when adequately awake. ST will continue treatment for dysphagia, aphasia and cognition.    HPI HPI: Pt is a 78 y.o. male presented to ED after significant other noted him to be acutely more confused than his baseline and having speech difficulty. EMS noted him to have (L) facial droop and expressive aphasia as well as hypertensive en route to ED. MRI (06/21/23) revealed, Punctate acute infarct in the left posterior subinsular white matter... small area of acute infarct in the more superior left frontal cortex. There also 2 punctate areas of diffusion hyperintensity without ADC correlate in the superior right frontal lobe which may be areas of subacute ischemia. Previous acute SLP services provided for dysphagia on 03/03/23, recommended advancement of diet to dys 3/thin liquids given improvement in mentation  and respiratory status. PMH: HLD, BPH, HTN, dementia, rheumatoid arthritis, COPD, Barrett's esophagus, OSA does not use CPAP, history of CVA.      SLP Plan  Continue with current plan of care      Recommendations for follow up therapy are one component of a multi-disciplinary discharge planning process, led by the attending physician.  Recommendations may be updated based on patient status, additional functional criteria and insurance authorization.    Recommendations  Diet recommendations: Dysphagia 1 (puree);Honey-thick liquid Liquids provided via: Cup;Teaspoon Medication Administration: Crushed with puree Supervision: Staff to assist with self feeding;Full supervision/cueing for compensatory strategies Compensations: Slow rate;Small sips/bites;Minimize environmental distractions;Lingual sweep for clearance of pocketing;Monitor for anterior loss Postural Changes and/or Swallow Maneuvers: Seated upright 90 degrees                  Oral care QID   Frequent or constant Supervision/Assistance Dysphagia, oropharyngeal phase (R13.12)     Continue with current plan of care     Dennis [name]  06/25/2023, 10:00 AM

## 2023-06-25 NOTE — Progress Notes (Signed)
 Occupational Therapy Treatment Patient Details Name: LIZANDRO SPELLMAN MRN: [id number] DOB: 1945-09-21 Today's Date: 06/25/2023   History of present illness The pt is a 78 yo male presenting 2/1 from home with concern for stroke (slurred speech and mild L facial droop). MRI showed:  small area of acute infarct in the more superior left frontal cortex and 2 areas of subacute ischemia in superior R frontal lobe. PMH includes: dementia, arthritis, HTN, HLD, RA, COPD, OSA, and CVA.   OT comments  Pt is making limited progress towards their acute OT goals. Pt was limited by LOA that did not improve with postural change this date. Overall he required max A to get to the EOB and close CGA- min A for sitting balance with a consistent R lateral lean and R head turn. Unable to facilitate any functional participation in simple ADLs, however pt did follow a few simple commands with multimodal cues and 5-10 seconds to process. Pt also with minimal communication throughout but did use yes/no and I dont know appropriately a few times with increased time. Attempted to stand EOB, pt unable to obtain full upright posture, and needed max A +2. OT to continue to follow acutely to facilitate progress towards established goals. Pt will continue to benefit from skilled inpatient follow up therapy, <3 hours/day.       If plan is discharge home, recommend the following:  A lot of help with walking and/or transfers;A lot of help with bathing/dressing/bathroom;Assistance with cooking/housework;Direct supervision/assist for medications management;Direct supervision/assist for financial management;Help with stairs or ramp for entrance;Assist for transportation;Assistance with feeding;Supervision due to cognitive status   Equipment Recommendations  Other (comment)       Precautions / Restrictions Precautions Precautions: Fall Precaution Comments: watch BP, wrist restraints Restrictions Weight Bearing Restrictions Per Provider  Order: No       Mobility Bed Mobility Overal bed mobility: Needs Assistance Bed Mobility: Supine to Sit, Sit to Supine Rolling: Max assist     Sit to supine: Min assist   General bed mobility comments: max A to initiate all aspects of getting OOB, however with multimodal cues adn increased time pt was able to initiate and sequence getting back into bed wtih min A    Transfers Overall transfer level: Needs assistance Equipment used: 2 person hand held assist Transfers: Sit to/from Stand Sit to Stand: Max assist, +2 physical assistance, +2 safety/equipment           General transfer comment: did not obtain full upright posture, bilateral knees blocked, pt with minimal active participation in stand     Balance Overall balance assessment: Needs assistance Sitting-balance support: No upper extremity supported Sitting balance-Leahy Scale: Fair   Postural control: Posterior lean Standing balance support: During functional activity, Bilateral upper extremity supported, Single extremity supported Standing balance-Leahy Scale: Poor                             ADL either performed or assessed with clinical judgement   ADL Overall ADL's : Needs assistance/impaired     Grooming: Total assistance Grooming Details (indicate cue type and reason): unable to facilate any functional participation in grooming task. wet cloth placed on pts face without regard, pt actively resisting hand over hand to remove rag                             Functional mobility during ADLs: Maximal  assistance;+2 for physical assistance;+2 for safety/equipment General ADL Comments: pt would be total A for all aspects of self care this date due to LOA and impaired cognition    Extremity/Trunk Assessment Upper Extremity Assessment Upper Extremity Assessment: Generalized weakness;Difficult to assess due to impaired cognition (pt actively resisting ROM with great strength bilaterally.  difficult to fully assess)   Lower Extremity Assessment Lower Extremity Assessment: Defer to PT evaluation        Vision   Vision Assessment?: No apparent visual deficits Additional Comments: wears glasses, pt's eyes were closed 99% of the session - head turned to the R the entire session   Perception Perception Perception: Not tested (L inattention?)   Praxis Praxis Praxis: Not tested    Cognition Arousal: Obtunded Behavior During Therapy: Flat affect Overall Cognitive Status: Difficult to assess                                 General Comments: eyes closed 99% of the session, he followed simple 1 step multimodal cues to kick legs and lay down when given a 5-10 second delay to respond. Pt with some appropriate communication throughout with yes/no and I dont know        Exercises      Shoulder Instructions       General Comments VSS. limited by LOA this date    Pertinent Vitals/ Pain       Pain Assessment Pain Assessment: Faces Faces Pain Scale: No hurt Pain Intervention(s): Monitored during session  Home Living                                          Prior Functioning/Environment              Frequency  Min 1X/week        Progress Toward Goals  OT Goals(current goals can now be found in the care plan section)  Progress towards OT goals: Not progressing toward goals - comment  Acute Rehab OT Goals Patient Stated Goal: unable to state OT Goal Formulation: Patient unable to participate in goal setting Time For Goal Achievement: 07/06/23 Potential to Achieve Goals: Good ADL Goals Pt Will Perform Grooming: with supervision;standing Pt Will Perform Lower Body Dressing: with supervision;sit to/from stand;sitting/lateral leans Pt Will Transfer to Toilet: with supervision;ambulating;bedside commode Pt Will Perform Toileting - Clothing Manipulation and hygiene: with supervision;sitting/lateral leans;sit to/from  stand Additional ADL Goal #1: Pt will follow 2 step commands with increased time.  Plan      Co-evaluation                 AM-PAC OT 6 Clicks Daily Activity     Outcome Measure   Help from another person eating meals?: Total Help from another person taking care of personal grooming?: Total Help from another person toileting, which includes using toliet, bedpan, or urinal?: Total Help from another person bathing (including washing, rinsing, drying)?: Total Help from another person to put on and taking off regular upper body clothing?: Total Help from another person to put on and taking off regular lower body clothing?: Total 6 Click Score: 6    End of Session Equipment Utilized During Treatment: Gait belt  OT Visit Diagnosis: Other abnormalities of gait and mobility (R26.89);Muscle weakness (generalized) (M62.81);Other symptoms and signs involving cognitive function  Activity Tolerance Patient limited by lethargy   Patient Left in bed;with call bell/phone within reach;with bed alarm set;with restraints reapplied   Nurse Communication Mobility status        Time: 8973-8947 OT Time Calculation (min): 26 min  Charges: OT General Charges $OT Visit: 1 Visit OT Treatments $Self Care/Home Management : 8-22 mins $Therapeutic Activity: 8-22 mins  Lucie Kendall, OTR/L Acute Rehabilitation Services Office [phone] Secure Chat Communication Preferred   Lucie JONETTA Kendall 06/25/2023, 2:02 PM

## 2023-06-25 NOTE — Plan of Care (Signed)
 Has been more alert and talkative this afternoon.  Speech still garbled but is communicating more and keeping eyes open longer.  Did ambulate in room with assist of this nurse and walker.  Continues to be impulsive and attempt to get out of the bed unassisted and also attempted to pull out IV.  No complaints and no signs of distress.   Problem: Safety: Goal: Non-violent Restraint(s) Outcome: Progressing   Problem: Self-Care: Goal: Ability to communicate needs accurately will improve Outcome: Progressing   Problem: Nutrition: Goal: Risk of aspiration will decrease Outcome: Progressing   Problem: Nutrition: Goal: Dietary intake will improve Outcome: Progressing   Problem: Clinical Measurements: Goal: Will remain free from infection Outcome: Progressing   Problem: Clinical Measurements: Goal: Respiratory complications will improve Outcome: Progressing   Problem: Activity: Goal: Risk for activity intolerance will decrease Outcome: Progressing

## 2023-06-25 NOTE — TOC Progression Note (Signed)
 Transition of Care [facility]) - Progression Note    Patient Details  Name: Dennis [name] MRN: [id number] Date of Birth: 1945-12-10  [facility]) CM/SW Contact  Almarie CHRISTELLA Goodie, [place] Phone Number: 06/25/2023, 1:48 PM  Clinical Narrative:   CSW spoke with Etha Chang at [facility] as paperwork for medical records was not received yet. DSS has yet to send the paperwork to CSW. CSW to follow.    Expected Discharge Plan: Skilled Nursing Facility Barriers to Discharge: Insurance Authorization, SNF Pending bed offer, Continued Medical Work up, Unsafe home situation  Expected Discharge Plan and Services     Post Acute Care Choice: Skilled Nursing Facility Living arrangements for the past 2 months: Single Family Home                                       Social Determinants of Health (SDOH) Interventions SDOH Screenings   Food Insecurity: No Food Insecurity (06/21/2023)  Housing: Low Risk  (06/21/2023)  Transportation Needs: No Transportation Needs (06/21/2023)  Utilities: Not At Risk (06/21/2023)  Social Connections: Moderately Isolated (06/21/2023)  Tobacco Use: Medium Risk (06/21/2023)    Readmission Risk Interventions     No data to display

## 2023-06-25 NOTE — Plan of Care (Signed)
  Problem: Nutrition: Goal: Risk of aspiration will decrease Outcome: Progressing Goal: Dietary intake will improve Outcome: Progressing   Problem: Clinical Measurements: Goal: Will remain free from infection Outcome: Progressing Goal: Cardiovascular complication will be avoided Outcome: Progressing   Problem: Activity: Goal: Risk for activity intolerance will decrease Outcome: Progressing   Problem: Elimination: Goal: Will not experience complications related to bowel motility Outcome: Progressing Goal: Will not experience complications related to urinary retention Outcome: Progressing   Problem: Skin Integrity: Goal: Risk for impaired skin integrity will decrease Outcome: Progressing

## 2023-06-25 NOTE — Progress Notes (Addendum)
 PROGRESS NOTE  Dennis [name] FMW:[id number] DOB: [birth year] DOA: 06/21/2023 PCP: Pcp, No  HPI/Recap of past 24 hours: Dennis [name] is a 78 y.o. male with medical history significant of HLD, BPH, HTN, dementia, rheumatoid arthritis, COPD, Barrett's esophagus, OSA does not use CPAP, history of CVA, presents to [facility] ED vis EMS after significant other noted him to be acutely more confused than his baseline and having speech difficulty. EMS noted him to have (L) facial droop and expressive aphasia as well as hypertensive en route to ED. History was obtained entirely from chart review. In the ED, patient vital signs fairly stable except for uncontrolled BP. Lab work largely unrevealing. CT head negative for acute intracranial abnormality. MRI with acute infarct in the (L) posterior subinsular white matter and chronic microvascular ischemic diease. Neurology consulted.  Of note, patient was noted to be noncompliant with medications.  Patient admitted for further management.     Today, pt sleepy, but once awakens, becomes restless. Cr up, appears dry. Started IVF   Assessment/Plan: Principal Problem:   CVA (cerebral vascular accident) (HCC) Active Problems:   HLD (hyperlipidemia)   HTN (hypertension)   BPH (benign prostatic hyperplasia)  Acute ischemic infarct Likely cardioembolic MRI showed left insula punctate acute infarcts in right frontal punctate subacute infarct CT head unremarkable ECHO with EF of 20 to 25%, global hypokinesis, moderate aortic valve thickening/calcification BLE Doppler, negative for DVT LDL 112, A1c 5.4 Neurology consulted, given possible cardioembolic stroke, started on IV heparin , currently switched to DOAC until EF is more than 30 to 35% Discontinue home ASA, Plavix , continue Eliquis  and Lipitor  Neurochecks, telemetry SLP- s/p MBS recommend dysphagia 1/pured, honey thick liquids PT/OT- SNF upon discharge  Acute on chronic systolic and diastolic  HF Appears compensated, BNP 946 Chest x-ray showed mild cardiomegaly, without congestion Echo showed EF of 20 to 25%, global hypokinesis, previous echo in 2023 showed EF of 45% Cardiology consulted, recommend GDMT regimen and titrate meds, signed off Restart on home Coreg , increased, will add on lisinopril  as BP permits as per cardiology Telemetry  AKI Pt with poor oral intake Creatinine bumped to 1.89 Bladder scan as needed to ensure patient is not retaining Start gentle IV fluids given history of CHF Daily BMP  Hypertension BP soft Hold home Coreg , hold lisinopril  due to AKI  Hyperlipidemia LDL 112, was on Lipitor  increased to 80 mg   BPH Proscar  and Flomax   Goals of care discussion Palliative care consulted     Estimated body mass index is 22.71 kg/m as calculated from the following:   Height as of this encounter: 5' 4 (1.626 m).   Weight as of this encounter: 60 kg.     Code Status: Full  Family Communication: None at bedside  Disposition Plan: Status is: Inpatient The patient will require care spanning > 2 midnights and should be moved to inpatient because: Level of care      Consultants: Neurology Cardiology  Procedures: None  Antimicrobials: None  DVT prophylaxis: Eliquis    Objective: Vitals:   06/24/23 2006 06/25/23 0352 06/25/23 0732 06/25/23 0836  BP: 100/60 121/71 112/60   Pulse: 74 78 67 74  Resp: 19 19 18    Temp: 98 F (36.7 C) 98.8 F (37.1 C) 99 F (37.2 C)   TempSrc: Oral Oral Oral   SpO2: 94% 96% 93%   Weight:      Height:        Intake/Output Summary (Last 24 hours) at 06/25/2023 1039 Last  data filed at 06/25/2023 0735 Gross per 24 hour  Intake --  Output 1150 ml  Net -1150 ml   Filed Weights   06/21/23 0000  Weight: 60 kg    Exam: General: NAD, aphasia, able to follow simple commands Cardiovascular: S1, S2 present Respiratory: CTAB Abdomen: Soft, nontender, nondistended, bowel sounds present Musculoskeletal:  No bilateral pedal edema noted Skin: Normal Psychiatry: Fair mood     Data Reviewed: CBC: Recent Labs  Lab 06/21/23 0016 06/21/23 0017 06/21/23 0837 06/22/23 1251 06/23/23 0619 06/25/23 0623  WBC 7.9  --  7.6 8.1 10.0 11.3*  NEUTROABS 3.9  --   --   --   --  7.5  HGB 13.0 13.6 12.9* 13.4 14.4 13.2  HCT 40.8 40.0 40.9 41.2 44.2 41.1  MCV 87.9  --  87.8 85.5 85.5 86.5  PLT 266  --  266 289 272 286   Basic Metabolic Panel: Recent Labs  Lab 06/21/23 0016 06/21/23 0017 06/21/23 0837 06/22/23 1251 06/23/23 0619 06/25/23 0623  NA 140 140 138 137 139 139  K 4.0 4.1 4.3 4.0 4.1 3.7  CL 105 105 103 105 107 107  CO2 26  --  22 23 23 23   GLUCOSE 100* 94 98 97 102* 105*  BUN 13 14 12 11 13  32*  CREATININE 1.05 1.10 1.05 1.31* 1.07 1.89*  CALCIUM  10.4*  --  10.0 9.6 9.6 9.3  MG  --   --  1.9  --   --   --    GFR: Estimated Creatinine Clearance: 27 mL/min (A) (by C-G formula based on SCr of 1.89 mg/dL (H)). Liver Function Tests: Recent Labs  Lab 06/21/23 0016  AST 18  ALT 13  ALKPHOS 29*  BILITOT 0.4  PROT 6.9  ALBUMIN 3.3*   No results for input(s): LIPASE, AMYLASE in the last 168 hours. No results for input(s): AMMONIA in the last 168 hours. Coagulation Profile: Recent Labs  Lab 06/21/23 0016  INR 1.0   Cardiac Enzymes: No results for input(s): CKTOTAL, CKMB, CKMBINDEX, TROPONINI in the last 168 hours. BNP (last 3 results) No results for input(s): PROBNP in the last 8760 hours. HbA1C: No results for input(s): HGBA1C in the last 72 hours.  CBG: Recent Labs  Lab 06/21/23 0013 06/21/23 2317  GLUCAP 88 99   Lipid Profile: No results for input(s): CHOL, HDL, LDLCALC, TRIG, CHOLHDL, LDLDIRECT in the last 72 hours.  Thyroid  Function Tests: No results for input(s): TSH, T4TOTAL, FREET4, T3FREE, THYROIDAB in the last 72 hours. Anemia Panel: No results for input(s): VITAMINB12, FOLATE, FERRITIN, TIBC, IRON,  RETICCTPCT in the last 72 hours. Urine analysis:    Component Value Date/Time   COLORURINE STRAW (A) 06/21/2023 0047   APPEARANCEUR CLEAR 06/21/2023 0047   LABSPEC 1.009 06/21/2023 0047   PHURINE 6.0 06/21/2023 0047   GLUCOSEU NEGATIVE 06/21/2023 0047   HGBUR NEGATIVE 06/21/2023 0047   BILIRUBINUR NEGATIVE 06/21/2023 0047   KETONESUR NEGATIVE 06/21/2023 0047   PROTEINUR NEGATIVE 06/21/2023 0047   NITRITE NEGATIVE 06/21/2023 0047   LEUKOCYTESUR NEGATIVE 06/21/2023 0047   Sepsis Labs: @LABRCNTIP (procalcitonin:4,lacticidven:4)  )No results found for this or any previous visit (from the past 240 hours).    Studies: No results found.   Scheduled Meds:  apixaban   5 mg Oral BID   atorvastatin   80 mg Oral Daily   carvedilol   6.25 mg Oral BID WC   finasteride   5 mg Oral Daily   pantoprazole   40 mg Oral Daily  tamsulosin   0.4 mg Oral Daily    Continuous Infusions:  sodium chloride         LOS: 3 days     Lebron JINNY Cage, MD [facility]  If 7PM-7AM, please contact night-coverage [email or web address] 06/25/2023, 10:39 AM

## 2023-06-26 DIAGNOSIS — I63131 Cerebral infarction due to embolism of right carotid artery: Secondary | ICD-10-CM

## 2023-06-26 LAB — BASIC METABOLIC PANEL
Anion gap: 7 (ref 5–15)
BUN: 34 mg/dL — ABNORMAL HIGH (ref 8–23)
CO2: 23 mmol/L (ref 22–32)
Calcium: 9.3 mg/dL (ref 8.9–10.3)
Chloride: 112 mmol/L — ABNORMAL HIGH (ref 98–111)
Creatinine, Ser: 1.68 mg/dL — ABNORMAL HIGH (ref 0.61–1.24)
GFR, Estimated: 41 mL/min — ABNORMAL LOW (ref >60)
Glucose, Bld: 98 mg/dL (ref 70–99)
Potassium: 3.9 mmol/L (ref 3.5–5.1)
Sodium: 142 mmol/L (ref 135–145)

## 2023-06-26 LAB — CBC WITH DIFFERENTIAL/PLATELET
Abs Immature Granulocytes: 0.04 K/uL (ref 0.00–0.07)
Basophils Absolute: 0 K/uL (ref 0.0–0.1)
Basophils Relative: 0 %
Eosinophils Absolute: 0.2 K/uL (ref 0.0–0.5)
Eosinophils Relative: 2 %
HCT: 40.1 % (ref 39.0–52.0)
Hemoglobin: 12.6 g/dL — ABNORMAL LOW (ref 13.0–17.0)
Immature Granulocytes: 0 %
Lymphocytes Relative: 20 %
Lymphs Abs: 1.9 K/uL (ref 0.7–4.0)
MCH: 27.9 pg (ref 26.0–34.0)
MCHC: 31.4 g/dL (ref 30.0–36.0)
MCV: 88.7 fL (ref 80.0–100.0)
Monocytes Absolute: 1.1 K/uL — ABNORMAL HIGH (ref 0.1–1.0)
Monocytes Relative: 11 %
Neutro Abs: 6.2 K/uL (ref 1.7–7.7)
Neutrophils Relative %: 67 %
Platelets: 243 K/uL (ref 150–400)
RBC: 4.52 MIL/uL (ref 4.22–5.81)
RDW: 16.1 % — ABNORMAL HIGH (ref 11.5–15.5)
WBC: 9.4 K/uL (ref 4.0–10.5)
nRBC: 0 % (ref 0.0–0.2)

## 2023-06-26 MED ORDER — QUETIAPINE FUMARATE 25 MG PO TABS
12.5000 mg | ORAL_TABLET | Freq: Two times a day (BID) | ORAL | Status: DC
  Administered 2023-06-26 – 2023-06-27 (×3): 12.5 mg via ORAL
  Filled 2023-06-26 (×3): qty 1

## 2023-06-26 NOTE — Progress Notes (Addendum)
 Speech Pathology: Late entry from 06/24/23  06/24/23 1200  SLP Visit Information  SLP Received On 06/24/23  General Information  Behavior/Cognition Alert  Patient Positioning Partially reclined  Oral care provided N/A  HPI Pt is a 78 y.o. male presented to ED after significant other noted him to be acutely more confused than his baseline and having speech difficulty. EMS noted him to have (L) facial droop and expressive aphasia as well as hypertensive en route to ED. MRI (06/21/23) revealed, Punctate acute infarct in the left posterior subinsular white matter... small area of acute infarct in the more superior left frontal cortex. There also 2 punctate areas of diffusion hyperintensity without ADC correlate in the superior right frontal lobe which may be areas of subacute ischemia. Previous acute SLP services provided for dysphagia on 03/03/23, recommended advancement of diet to dys 3/thin liquids given improvement in mentation and respiratory status. PMH: HLD, BPH, HTN, dementia, rheumatoid arthritis, COPD, Barrett's esophagus, OSA does not use CPAP, history of CVA.  Treatment Provided  Treatment provided Cognitive-Linquistic/swallowing:   Dennis [name] was restless today, difficult to reassure.  He was more communicative and speaking in sentences, requiring cues to repeat due to poor intellligibility - he asked repetitively to call his sign other (we called but there was no answer).  He repeated I'm trying to get back home but I don't know how to do it. He was able to name and discriminate functional items around the room with good accuracy and followed simple commands.  He reluctantly accepted a few bites of puree and sips of honey-thick liquid with mild oral holding, no s/s of aspiration, but then gave clear instructions to set the food back over there and declined any further PO. SLP will continue to follow - will need to repeat MBS prior to D/C.  Dysphagia Treatment  Temperature Spikes Noted No   Respiratory Status Room air  Oral Cavity - Dentition Poor condition;Missing dentition  Treatment Methods Skilled observation  Patient observed directly with PO's Yes  Type of PO's observed Honey-thick liquids;Dysphagia 1 (puree)  Feeding Needed assist  Liquids provided via Teaspoon  Oral Phase Signs & Symptoms Oral holding  Type of cueing Verbal  Other treatment/comments mod  Cognitive-Linquistic Treatment  Treatment focused on Aphasia  SLP - End of Session  Patient left in bed;with restraints reapplied  Assessment / Recommendations / Plan  Plan Continue with current plan of care  Dysphagia Recommendations  Diet recommendations Dysphagia 1 (puree);Honey-thick liquid  Liquids provided via Cup  Medication Administration Crushed with puree  Supervision Staff to assist with self feeding  Compensations Slow rate;Small sips/bites  Postural Changes and/or Swallow Maneuvers Seated upright 90 degrees  General Recommendations  Oral Care Recommendations Oral care QID  Assistance recommended at discharge Frequent or constant Supervision/Assistance  SLP Visit Diagnosis Dysphagia, oropharyngeal phase (R13.12)  SLP Time Calculation  SLP Start Time (ACUTE ONLY) 1229  SLP Stop Time (ACUTE ONLY) 1242  SLP Time Calculation (min) (ACUTE ONLY) 13 min  SLP Evaluations  $ SLP Speech Visit 1 Visit  SLP Evaluations  $Speech Treatment for Individual 1 Procedure   Dennis Thede L. Vona, MA CCC/SLP Clinical Specialist - Acute Care SLP Acute Rehabilitation Services Office number [phone]

## 2023-06-26 NOTE — Progress Notes (Signed)
 PROGRESS NOTE    Dennis [name]  FMW:[id number] DOB: [birth year] DOA: 06/21/2023 PCP: Freddrick, No    Brief Narrative:  Dennis [name] is a 78 y.o. male with medical history significant of HLD, BPH, HTN, dementia, rheumatoid arthritis, COPD, Barrett's esophagus, OSA does not use CPAP, history of CVA, presented to [facility] ED vis EMS after significant other noted him to be acutely more confused than his baseline and having speech difficulty. EMS noted him to have (L) facial droop and expressive aphasia as well as hypertensive en route to ED. In the ED, patient vital signs fairly stable except for uncontrolled BP. Lab work largely unrevealing. CT head negative for acute intracranial abnormality. MRI with acute infarct in the (L) posterior subinsular white matter and chronic microvascular ischemic diease. Neurology consulted.  Of note, patient was noted to be noncompliant with medications.  Patient admitted for further management.  Seen by neurology and cardiology. Now waiting to go to a skilled nursing facility.  Subjective: Patient seen and examined in the morning rounds.  He is pleasant and interactive.  Reportedly was agitated overnight and was on soft restraint.  Currently he is able to give answer to few basic questions but confused.   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  Discontinue home ASA, Plavix , continue Eliquis  and Lipitor  SLP- s/p MBS recommend dysphagia 1/pured, honey thick liquids PT/OT- SNF upon discharge   Acute on chronic systolic and diastolic HF Appears compensated, BNP 946 Chest x-ray  showed mild cardiomegaly, without congestion Echo showed EF of 20 to 25%, global hypokinesis, previous echo in 2023 showed EF of 45% Cardiology consulted, recommend GDMT regimen and titrate meds, signed off Carvedilol  and lisinopril  was on hold.  His blood pressures are adequate today.  Will start carvedilol  today and gradually start lisinopril .   AKI Pt with poor oral intake Creatinine bumped to 1.89 Bladder scan as needed to ensure patient is not retaining Stabilizing.  Off IV fluids.   Hypertension BP improved.  start carvedilol .  Hold lisinopril  today.   Hyperlipidemia LDL 112, was on Lipitor  increased to 80 mg   BPH Proscar  and Flomax    Goals of care discussion Palliative care consulted Patient is not able to make his own healthcare decisions.  His significant other also has dementia.  Social services involved.  Waiting for guardianship. In the meantime, patient remains full code.  Attending provider to provide any emergency consults.   DVT prophylaxis:  apixaban  (ELIQUIS ) tablet 5 mg   Code Status: Full code Family Communication: None at the bedside Disposition Plan: Status is: Inpatient Remains inpatient appropriate because: Unsafe discharge planning     Consultants:  Palliative care Cardiology Neurology  Procedures:  None  Antimicrobials:  None     Objective: Vitals:   06/25/23 2333 06/26/23 0435 06/26/23 0856 06/26/23 1107  BP: (!) 124/59 (!) 167/82 (!) 148/91 (!) 144/63  Pulse: 66 77 76 78  Resp: 17 16 19 19   Temp: 98.2 F (36.8 C) 98.3 F (36.8 C) 98 F (36.7 C) 98.1 F (36.7 C)  TempSrc: Oral Oral Oral Oral  SpO2: 91% 93% 95% 96%  Weight:      Height:  Intake/Output Summary (Last 24 hours) at 06/26/2023 1149 Last data filed at 06/26/2023 1055 Gross per 24 hour  Intake 1719.14 ml  Output 400 ml  Net 1319.14 ml   Filed Weights   06/21/23 0000  Weight: 60 kg    Examination:  General exam: Appears calm and comfortable  Frail  debilitated. Patient is alert and awake but not oriented.  He follows simple commands.  Left upper extremity slightly weaker than the right. Respiratory system: Clear to auscultation. Respiratory effort normal. Cardiovascular system: S1 & S2 heard, RRR.  Gastrointestinal system: soft , NT , BS positive.     Data Reviewed: I have personally reviewed following labs and imaging studies  CBC: Recent Labs  Lab 06/21/23 0016 06/21/23 0017 06/21/23 0837 06/22/23 1251 06/23/23 0619 06/25/23 0623 06/26/23 0617  WBC 7.9  --  7.6 8.1 10.0 11.3* 9.4  NEUTROABS 3.9  --   --   --   --  7.5 6.2  HGB 13.0   < > 12.9* 13.4 14.4 13.2 12.6*  HCT 40.8   < > 40.9 41.2 44.2 41.1 40.1  MCV 87.9  --  87.8 85.5 85.5 86.5 88.7  PLT 266  --  266 289 272 286 243   < > = values in this interval not displayed.   Basic Metabolic Panel: Recent Labs  Lab 06/21/23 0837 06/22/23 1251 06/23/23 0619 06/25/23 0623 06/26/23 0617  NA 138 137 139 139 142  K 4.3 4.0 4.1 3.7 3.9  CL 103 105 107 107 112*  CO2 22 23 23 23 23   GLUCOSE 98 97 102* 105* 98  BUN 12 11 13  32* 34*  CREATININE 1.05 1.31* 1.07 1.89* 1.68*  CALCIUM  10.0 9.6 9.6 9.3 9.3  MG 1.9  --   --   --   --    GFR: Estimated Creatinine Clearance: 30.3 mL/min (A) (by C-G formula based on SCr of 1.68 mg/dL (H)). Liver Function Tests: Recent Labs  Lab 06/21/23 0016  AST 18  ALT 13  ALKPHOS 29*  BILITOT 0.4  PROT 6.9  ALBUMIN 3.3*   No results for input(s): LIPASE, AMYLASE in the last 168 hours. No results for input(s): AMMONIA in the last 168 hours. Coagulation Profile: Recent Labs  Lab 06/21/23 0016  INR 1.0   Cardiac Enzymes: No results for input(s): CKTOTAL, CKMB, CKMBINDEX, TROPONINI in the last 168 hours. BNP (last 3 results) No results for input(s): PROBNP in the last 8760 hours. HbA1C: No results for input(s): HGBA1C in the last 72 hours. CBG: Recent Labs  Lab 06/21/23 0013 06/21/23 2317  GLUCAP 88  99   Lipid Profile: No results for input(s): CHOL, HDL, LDLCALC, TRIG, CHOLHDL, LDLDIRECT in the last 72 hours. Thyroid  Function Tests: No results for input(s): TSH, T4TOTAL, FREET4, T3FREE, THYROIDAB in the last 72 hours. Anemia Panel: No results for input(s): VITAMINB12, FOLATE, FERRITIN, TIBC, IRON, RETICCTPCT in the last 72 hours. Sepsis Labs: No results for input(s): PROCALCITON, LATICACIDVEN in the last 168 hours.  No results found for this or any previous visit (from the past 240 hours).       Radiology Studies: No results found.      Scheduled Meds:  apixaban   5 mg Oral BID   atorvastatin   80 mg Oral Daily   finasteride   5 mg Oral Daily   pantoprazole   40 mg Oral Daily   QUEtiapine   12.5 mg Oral BID   tamsulosin   0.4 mg Oral Daily   Continuous Infusions:  LOS: 4 days    Time spent: 40 minutes     Renato Applebaum, MD [facility]

## 2023-06-26 NOTE — Plan of Care (Signed)
  Problem: Education: Goal: Knowledge of disease or condition will improve Outcome: Progressing Goal: Knowledge of secondary prevention will improve (MUST DOCUMENT ALL) Outcome: Progressing Goal: Knowledge of patient specific risk factors will improve (DELETE if not current risk factor) Outcome: Progressing   Problem: Ischemic Stroke/TIA Tissue Perfusion: Goal: Complications of ischemic stroke/TIA will be minimized Outcome: Progressing   Problem: Coping: Goal: Will verbalize positive feelings about self Outcome: Progressing Goal: Will identify appropriate support needs Outcome: Progressing   Problem: Health Behavior/Discharge Planning: Goal: Ability to manage health-related needs will improve Outcome: Progressing Goal: Goals will be collaboratively established with patient/family Outcome: Progressing   Problem: Self-Care: Goal: Ability to participate in self-care as condition permits will improve Outcome: Progressing Goal: Verbalization of feelings and concerns over difficulty with self-care will improve Outcome: Progressing Goal: Ability to communicate needs accurately will improve Outcome: Progressing   Problem: Nutrition: Goal: Risk of aspiration will decrease Outcome: Progressing Goal: Dietary intake will improve Outcome: Progressing   Problem: Education: Goal: Knowledge of General Education information will improve Description: Including pain rating scale, medication(s)/side effects and non-pharmacologic comfort measures Outcome: Progressing   Problem: Health Behavior/Discharge Planning: Goal: Ability to manage health-related needs will improve Outcome: Progressing   Problem: Clinical Measurements: Goal: Ability to maintain clinical measurements within normal limits will improve Outcome: Progressing Goal: Will remain free from infection Outcome: Progressing Goal: Diagnostic test results will improve Outcome: Progressing Goal: Respiratory complications will  improve Outcome: Progressing Goal: Cardiovascular complication will be avoided Outcome: Progressing   Problem: Activity: Goal: Risk for activity intolerance will decrease Outcome: Progressing   Problem: Nutrition: Goal: Adequate nutrition will be maintained Outcome: Progressing   Problem: Coping: Goal: Level of anxiety will decrease Outcome: Progressing   Problem: Elimination: Goal: Will not experience complications related to bowel motility Outcome: Progressing Goal: Will not experience complications related to urinary retention Outcome: Progressing   Problem: Pain Managment: Goal: General experience of comfort will improve and/or be controlled Outcome: Progressing   Problem: Safety: Goal: Ability to remain free from injury will improve Outcome: Progressing   Problem: Skin Integrity: Goal: Risk for impaired skin integrity will decrease Outcome: Progressing   Problem: Safety: Goal: Non-violent Restraint(s) Outcome: Progressing

## 2023-06-27 DIAGNOSIS — I63131 Cerebral infarction due to embolism of right carotid artery: Secondary | ICD-10-CM | POA: Diagnosis not present

## 2023-06-27 MED ORDER — POLYVINYL ALCOHOL 1.4 % OP SOLN: 2.0000 [drp] | OPHTHALMIC | Status: DC | PRN

## 2023-06-27 MED ORDER — LIDOCAINE 5 % EX PTCH
1.0000 | MEDICATED_PATCH | CUTANEOUS | Status: DC
  Administered 2023-06-27 – 2023-08-13 (×37): 1 via TRANSDERMAL
  Filled 2023-06-27 (×39): qty 1

## 2023-06-27 MED ORDER — HYDRALAZINE HCL 50 MG PO TABS
50.0000 mg | ORAL_TABLET | Freq: Three times a day (TID) | ORAL | Status: DC
  Administered 2023-06-27 – 2023-07-04 (×22): 50 mg via ORAL
  Filled 2023-06-27 (×23): qty 1

## 2023-06-27 MED ORDER — QUETIAPINE FUMARATE 25 MG PO TABS
12.5000 mg | ORAL_TABLET | ORAL | Status: DC
  Administered 2023-06-28 – 2023-07-04 (×7): 12.5 mg via ORAL
  Filled 2023-06-27 (×7): qty 1

## 2023-06-27 MED ORDER — ACETAMINOPHEN 500 MG PO TABS
1000.0000 mg | ORAL_TABLET | Freq: Four times a day (QID) | ORAL | Status: AC
  Administered 2023-06-27 – 2023-06-29 (×6): 1000 mg via ORAL
  Filled 2023-06-27 (×7): qty 2

## 2023-06-27 MED ORDER — QUETIAPINE FUMARATE 50 MG PO TABS
50.0000 mg | ORAL_TABLET | Freq: Every day | ORAL | Status: DC
  Administered 2023-06-27 – 2023-06-29 (×3): 50 mg via ORAL
  Filled 2023-06-27 (×3): qty 1

## 2023-06-27 MED ORDER — ISOSORBIDE MONONITRATE ER 30 MG PO TB24
30.0000 mg | ORAL_TABLET | Freq: Every day | ORAL | Status: DC
  Administered 2023-06-27 – 2023-08-14 (×49): 30 mg via ORAL
  Filled 2023-06-27 (×49): qty 1

## 2023-06-27 NOTE — Progress Notes (Signed)
 Palliative-  Chart reviewed.  Await appointment of legal guardian/decision maker.  PMT will continue to follow peripherally.   Micki Alas, AGNP-C Palliative Medicine  No charge

## 2023-06-27 NOTE — Plan of Care (Addendum)
 Pt is alert to self. Pt agitated as hs. Seroquel  given and was effective. BP elevated 0400 vitals rechecked prior to giving meds. 164/70 hydralazine  given. Hydralazine  effective bp 141/70. Tylenol  given for left shoulder pain. Unable to understand the cause from the patient. Meds crushed in applesauce. Honey thick liquids. Last bm 2/4. 250 cc out of male primafit.  Problem: Education: Goal: Knowledge of disease or condition will improve Outcome: Progressing Goal: Knowledge of secondary prevention will improve (MUST DOCUMENT ALL) Outcome: Progressing Goal: Knowledge of patient specific risk factors will improve (DELETE if not current risk factor) Outcome: Progressing   Problem: Ischemic Stroke/TIA Tissue Perfusion: Goal: Complications of ischemic stroke/TIA will be minimized Outcome: Progressing   Problem: Coping: Goal: Will verbalize positive feelings about self Outcome: Progressing Goal: Will identify appropriate support needs Outcome: Progressing   Problem: Health Behavior/Discharge Planning: Goal: Ability to manage health-related needs will improve Outcome: Progressing Goal: Goals will be collaboratively established with patient/family Outcome: Progressing   Problem: Self-Care: Goal: Ability to participate in self-care as condition permits will improve Outcome: Progressing Goal: Verbalization of feelings and concerns over difficulty with self-care will improve Outcome: Progressing Goal: Ability to communicate needs accurately will improve Outcome: Progressing   Problem: Nutrition: Goal: Risk of aspiration will decrease Outcome: Progressing Goal: Dietary intake will improve Outcome: Progressing   Problem: Education: Goal: Knowledge of General Education information will improve Description: Including pain rating scale, medication(s)/side effects and non-pharmacologic comfort measures Outcome: Progressing   Problem: Health Behavior/Discharge Planning: Goal: Ability to  manage health-related needs will improve Outcome: Progressing   Problem: Clinical Measurements: Goal: Ability to maintain clinical measurements within normal limits will improve Outcome: Progressing Goal: Will remain free from infection Outcome: Progressing Goal: Diagnostic test results will improve Outcome: Progressing Goal: Respiratory complications will improve Outcome: Progressing Goal: Cardiovascular complication will be avoided Outcome: Progressing   Problem: Activity: Goal: Risk for activity intolerance will decrease Outcome: Progressing   Problem: Nutrition: Goal: Adequate nutrition will be maintained Outcome: Progressing   Problem: Coping: Goal: Level of anxiety will decrease Outcome: Progressing   Problem: Elimination: Goal: Will not experience complications related to bowel motility Outcome: Progressing Goal: Will not experience complications related to urinary retention Outcome: Progressing   Problem: Pain Managment: Goal: General experience of comfort will improve and/or be controlled Outcome: Progressing   Problem: Safety: Goal: Ability to remain free from injury will improve Outcome: Progressing   Problem: Skin Integrity: Goal: Risk for impaired skin integrity will decrease Outcome: Progressing   Problem: Safety: Goal: Non-violent Restraint(s) Outcome: Progressing
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
## 2023-06-27 NOTE — Plan of Care (Signed)
  Problem: Education: Goal: Knowledge of disease or condition will improve Outcome: Progressing Goal: Knowledge of secondary prevention will improve (MUST DOCUMENT ALL) Outcome: Progressing Goal: Knowledge of patient specific risk factors will improve (DELETE if not current risk factor) Outcome: Progressing   Problem: Ischemic Stroke/TIA Tissue Perfusion: Goal: Complications of ischemic stroke/TIA will be minimized Outcome: Progressing   Problem: Coping: Goal: Will verbalize positive feelings about self Outcome: Progressing Goal: Will identify appropriate support needs Outcome: Progressing   Problem: Health Behavior/Discharge Planning: Goal: Ability to manage health-related needs will improve Outcome: Progressing Goal: Goals will be collaboratively established with patient/family Outcome: Progressing   Problem: Self-Care: Goal: Ability to participate in self-care as condition permits will improve Outcome: Progressing Goal: Verbalization of feelings and concerns over difficulty with self-care will improve Outcome: Progressing Goal: Ability to communicate needs accurately will improve Outcome: Progressing   Problem: Nutrition: Goal: Risk of aspiration will decrease Outcome: Progressing Goal: Dietary intake will improve Outcome: Progressing   Problem: Education: Goal: Knowledge of General Education information will improve Description: Including pain rating scale, medication(s)/side effects and non-pharmacologic comfort measures Outcome: Progressing   Problem: Health Behavior/Discharge Planning: Goal: Ability to manage health-related needs will improve Outcome: Progressing   Problem: Clinical Measurements: Goal: Ability to maintain clinical measurements within normal limits will improve Outcome: Progressing Goal: Will remain free from infection Outcome: Progressing Goal: Diagnostic test results will improve Outcome: Progressing Goal: Respiratory complications will  improve Outcome: Progressing Goal: Cardiovascular complication will be avoided Outcome: Progressing   Problem: Activity: Goal: Risk for activity intolerance will decrease Outcome: Progressing   Problem: Nutrition: Goal: Adequate nutrition will be maintained Outcome: Progressing   Problem: Coping: Goal: Level of anxiety will decrease Outcome: Progressing   Problem: Elimination: Goal: Will not experience complications related to bowel motility Outcome: Progressing Goal: Will not experience complications related to urinary retention Outcome: Progressing   Problem: Pain Managment: Goal: General experience of comfort will improve and/or be controlled Outcome: Progressing   Problem: Safety: Goal: Ability to remain free from injury will improve Outcome: Progressing   Problem: Skin Integrity: Goal: Risk for impaired skin integrity will decrease Outcome: Progressing   Problem: Safety: Goal: Non-violent Restraint(s) Outcome: Progressing

## 2023-06-27 NOTE — TOC Progression Note (Signed)
 Transition of Care [facility]) - Progression Note    Patient Details  Name: Dennis [name] MRN: [id number] Date of Birth: 03-Mar-1946  Transition of Care [facility]) CM/SW Contact  Almarie CHRISTELLA Goodie, [place] Phone Number: 06/27/2023, 2:19 PM  Clinical Narrative:   CSW attempted to reach Nat Chang with DSS as request for medical records has still not been received. Also, RN expressed concern that the patient's spouse (who is also being investigated for possible guardianship) may not have a caregiver at this time as she is calling multiple times asking about the patient and seems confused. CSW attempted to relay concern to DSS, awaiting call back.    Expected Discharge Plan: Skilled Nursing Facility Barriers to Discharge: Insurance Authorization, SNF Pending bed offer, Continued Medical Work up, Unsafe home situation  Expected Discharge Plan and Services     Post Acute Care Choice: Skilled Nursing Facility Living arrangements for the past 2 months: Single Family Home                                       Social Determinants of Health (SDOH) Interventions SDOH Screenings   Food Insecurity: No Food Insecurity (06/21/2023)  Housing: Low Risk  (06/21/2023)  Transportation Needs: No Transportation Needs (06/21/2023)  Utilities: Not At Risk (06/21/2023)  Social Connections: Moderately Isolated (06/21/2023)  Tobacco Use: Medium Risk (06/21/2023)    Readmission Risk Interventions     No data to display

## 2023-06-27 NOTE — Progress Notes (Signed)
 Physical Therapy Treatment Patient Details Name: Dennis [name] MRN: [id number] DOB: 01/07/1946 Today's Date: 06/27/2023   History of Present Illness The pt is a 78 yo male presenting 2/1 from home with concern for stroke (slurred speech and mild L facial droop). MRI showed:  small area of acute infarct in the more superior left frontal cortex and 2 areas of subacute ischemia in superior R frontal lobe. PMH includes: dementia, arthritis, HTN, HLD, RA, COPD, OSA, and CVA.    PT Comments  Pt making good progress towards acute goals this session. Pt able to follow all simple single steps commands with increased time. Pt with garbled/slurred speech throughout session increasing difficulty to understand pt conversation however pt awake and alert throughout. Physically pt requiring min A to complete bed mobility, transfers and for gait with HHA on R with assist needed to steady and maintain balance throughout. Current plan remains appropriate to address deficits and maximize functional independence and decrease caregiver burden. Pt continues to benefit from skilled PT services to progress toward functional mobility goals.     If plan is discharge home, recommend the following: Direct supervision/assist for medications management;Help with stairs or ramp for entrance;Assist for transportation;Assistance with feeding;Direct supervision/assist for financial management;Assistance with cooking/housework;Supervision due to cognitive status;A little help with walking and/or transfers   Can travel by private vehicle     Yes  Equipment Recommendations  Other (comment) (defer to next venue)    Recommendations for Other Services       Precautions / Restrictions Precautions Precautions: Fall Precaution Comments: watch BP Restrictions Weight Bearing Restrictions Per Provider Order: No     Mobility  Bed Mobility Overal bed mobility: Needs Assistance Bed Mobility: Supine to Sit, Sit to Supine     Supine  to sit: Min assist Sit to supine: Contact guard assist   General bed mobility comments: min A to scoot out to EOB and bring LLE to edge, CGA to return to bed with pt climbing in quadruped    Transfers Overall transfer level: Needs assistance Equipment used: 1 person hand held assist Transfers: Sit to/from Stand Sit to Stand: Min assist           General transfer comment: min A to boost and steady on rise, standing x5 throughout session    Ambulation/Gait Ambulation/Gait assistance: Min assist Gait Distance (Feet): 75 Feet Assistive device: 1 person hand held assist Gait Pattern/deviations: Step-to pattern, Decreased stride length, Trunk flexed, Narrow base of support, Drifts right/left Gait velocity: variable     General Gait Details: Drifts to right with decr awareness of objects on his right. HHA to steady   Optometrist     Tilt Bed    Modified Rankin (Stroke Patients Only) Modified Rankin (Stroke Patients Only) Pre-Morbid Rankin Score: Moderate disability Modified Rankin: Moderately severe disability     Balance Overall balance assessment: Needs assistance Sitting-balance support: No upper extremity supported Sitting balance-Leahy Scale: Fair     Standing balance support: During functional activity, Bilateral upper extremity supported, Single extremity supported Standing balance-Leahy Scale: Poor Standing balance comment: min A to maintain balance                            Cognition Arousal: Alert Behavior During Therapy: Impulsive, Flat affect Overall Cognitive Status: Difficult to assess  General Comments: awake and alert througout session, following single step commands with increased time, easily distractible but able to be redirected, garbled speech and difficult to understand at times        Exercises      General Comments General comments (skin  integrity, edema, etc.): VSS      Pertinent Vitals/Pain Pain Assessment Pain Assessment: Faces Faces Pain Scale: Hurts a little bit Pain Location: R shoulder Pain Descriptors / Indicators: Sore Pain Intervention(s): Monitored during session, Limited activity within patient's tolerance    Home Living                          Prior Function            PT Goals (current goals can now be found in the care plan section) Acute Rehab PT Goals Patient Stated Goal: none stated PT Goal Formulation: With patient Time For Goal Achievement: 07/05/23 Progress towards PT goals: Progressing toward goals    Frequency    Min 1X/week      PT Plan      Co-evaluation              AM-PAC PT 6 Clicks Mobility   Outcome Measure  Help needed turning from your back to your side while in a flat bed without using bedrails?: A Lot Help needed moving from lying on your back to sitting on the side of a flat bed without using bedrails?: A Little Help needed moving to and from a bed to a chair (including a wheelchair)?: A Little Help needed standing up from a chair using your arms (e.g., wheelchair or bedside chair)?: A Little Help needed to walk in hospital room?: A Little Help needed climbing 3-5 steps with a railing? : Total 6 Click Score: 15    End of Session Equipment Utilized During Treatment: Gait belt Activity Tolerance: Patient tolerated treatment well Patient left: in bed;with call bell/phone within reach;with bed alarm set Nurse Communication: Mobility status PT Visit Diagnosis: Unsteadiness on feet (R26.81);Muscle weakness (generalized) (M62.81);Other abnormalities of gait and mobility (R26.89);Difficulty in walking, not elsewhere classified (R26.2)     Time: 8579-8555 PT Time Calculation (min) (ACUTE ONLY): 24 min  Charges:    $Gait Training: 8-22 mins $Therapeutic Activity: 8-22 mins PT General Charges $$ ACUTE PT VISIT: 1 Visit                      Elsy Chiang R. PTA Acute Rehabilitation Services Office: [phone]   Therisa CHRISTELLA Boor 06/27/2023, 4:19 PM

## 2023-06-27 NOTE — Progress Notes (Signed)
 PROGRESS NOTE    Dennis [name]  FMW:[id number] DOB: 01-05-1946 DOA: 06/21/2023 PCP: Freddrick, No    Brief Narrative:  Dennis [name] is a 78 y.o. male with medical history significant of HLD, BPH, HTN, dementia, rheumatoid arthritis, COPD, Barrett's esophagus, OSA does not use CPAP, history of CVA, presented to [facility] ED vis EMS after significant other noted him to be acutely more confused than his baseline and having speech difficulty. EMS noted him to have (L) facial droop and expressive aphasia as well as hypertensive en route to ED. In the ED, patient vital signs fairly stable except for uncontrolled BP. Lab work largely unrevealing. CT head negative for acute intracranial abnormality. MRI with acute infarct in the (L) posterior subinsular white matter and chronic microvascular ischemic diease. Neurology consulted.  Of note, patient was noted to be noncompliant with medications.  Patient admitted for further management.  Seen by neurology and cardiology. Now waiting to go to a skilled nursing facility.  Waiting for legal guardianship.  Subjective:  Patient seen and examined.  Difficult to understand.  He is tearful today.  Overnight anxiety and slight agitation noted.  Did not require any restraints last 24 hours.   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  Discontinue home ASA, Plavix , continue Eliquis  and Lipitor  SLP- s/p MBS recommend dysphagia 1/pured, honey thick liquids PT/OT- SNF upon discharge   Acute on chronic systolic and diastolic HF Appears compensated, BNP 946 Chest x-ray showed mild  cardiomegaly, without congestion Echo showed EF of 20 to 25%, global hypokinesis, previous echo in 2023 showed EF of 45% Cardiology consulted, recommend GDMT regimen and titrate meds, signed off Carvedilol  and lisinopril  was on hold.  His blood pressures are adequate today.  Coreg  resumed.   AKI Pt with poor oral intake Creatinine bumped to 1.89 Bladder scan as needed to ensure patient is not retaining Stabilizing.  Off IV fluids.   Hypertension BP improved.  start carvedilol .  Hold lisinopril  today.   Hyperlipidemia LDL 112, was on Lipitor  increased to 80 mg   BPH Proscar  and Flomax    Goals of care discussion Palliative care consulted Patient is not able to make his own healthcare decisions.  His significant other also has dementia.  Social services involved.  Waiting for guardianship. In the meantime, patient remains full code.  Attending provider to provide any emergency consults.   DVT prophylaxis:  apixaban  (ELIQUIS ) tablet 5 mg   Code Status: Full code Family Communication: None at the bedside Disposition Plan: Status is: Inpatient Remains inpatient appropriate because: Unsafe discharge planning     Consultants:  Palliative care Cardiology Neurology  Procedures:  None  Antimicrobials:  None     Objective: Vitals:   06/27/23 0535 06/27/23 0634 06/27/23 0924 06/27/23 1146  BP: (!) 164/70 (!) 141/70 (!) 171/75 113/60  Pulse:   73 (!) 112  Resp:   16 18  Temp:   (!) 97.5 F (36.4 C) (!) 97.3 F (36.3 C)  TempSrc:   Oral Oral  SpO2:   96% 94%  Weight:      Height:        Intake/Output Summary (Last 24 hours) at 06/27/2023  1256 Last data filed at 06/27/2023 0500 Gross per 24 hour  Intake 171.81 ml  Output 650 ml  Net -478.19 ml   Filed Weights   06/21/23 0000  Weight: 60 kg    Examination:  General exam: Appears anxious and tearful today. Frail debilitated. Patient is alert and awake but not oriented.  He follows simple commands.  Left  upper extremity slightly weaker than the right. Respiratory system: Clear to auscultation. Respiratory effort normal. Cardiovascular system: S1 & S2 heard, RRR.  Gastrointestinal system: soft , NT , BS positive.     Data Reviewed: I have personally reviewed following labs and imaging studies  CBC: Recent Labs  Lab 06/21/23 0016 06/21/23 0017 06/21/23 0837 06/22/23 1251 06/23/23 0619 06/25/23 0623 06/26/23 0617  WBC 7.9  --  7.6 8.1 10.0 11.3* 9.4  NEUTROABS 3.9  --   --   --   --  7.5 6.2  HGB 13.0   < > 12.9* 13.4 14.4 13.2 12.6*  HCT 40.8   < > 40.9 41.2 44.2 41.1 40.1  MCV 87.9  --  87.8 85.5 85.5 86.5 88.7  PLT 266  --  266 289 272 286 243   < > = values in this interval not displayed.   Basic Metabolic Panel: Recent Labs  Lab 06/21/23 0837 06/22/23 1251 06/23/23 0619 06/25/23 0623 06/26/23 0617  NA 138 137 139 139 142  K 4.3 4.0 4.1 3.7 3.9  CL 103 105 107 107 112*  CO2 22 23 23 23 23   GLUCOSE 98 97 102* 105* 98  BUN 12 11 13  32* 34*  CREATININE 1.05 1.31* 1.07 1.89* 1.68*  CALCIUM  10.0 9.6 9.6 9.3 9.3  MG 1.9  --   --   --   --    GFR: Estimated Creatinine Clearance: 30.3 mL/min (A) (by C-G formula based on SCr of 1.68 mg/dL (H)). Liver Function Tests: Recent Labs  Lab 06/21/23 0016  AST 18  ALT 13  ALKPHOS 29*  BILITOT 0.4  PROT 6.9  ALBUMIN 3.3*   No results for input(s): LIPASE, AMYLASE in the last 168 hours. No results for input(s): AMMONIA in the last 168 hours. Coagulation Profile: Recent Labs  Lab 06/21/23 0016  INR 1.0   Cardiac Enzymes: No results for input(s): CKTOTAL, CKMB, CKMBINDEX, TROPONINI in the last 168 hours. BNP (last 3 results) No results for input(s): PROBNP in the last 8760 hours. HbA1C: No results for input(s): HGBA1C in the last 72 hours. CBG: Recent Labs  Lab 06/21/23 0013 06/21/23 2317  GLUCAP 88 99   Lipid Profile: No results for input(s): CHOL, HDL, LDLCALC, TRIG, CHOLHDL,  LDLDIRECT in the last 72 hours. Thyroid  Function Tests: No results for input(s): TSH, T4TOTAL, FREET4, T3FREE, THYROIDAB in the last 72 hours. Anemia Panel: No results for input(s): VITAMINB12, FOLATE, FERRITIN, TIBC, IRON, RETICCTPCT in the last 72 hours. Sepsis Labs: No results for input(s): PROCALCITON, LATICACIDVEN in the last 168 hours.  No results found for this or any previous visit (from the past 240 hours).       Radiology Studies: No results found.      Scheduled Meds:  apixaban   5 mg Oral BID   atorvastatin   80 mg Oral Daily   finasteride   5 mg Oral Daily   hydrALAZINE   50 mg Oral Q8H   isosorbide  mononitrate  30 mg Oral Daily   pantoprazole   40 mg Oral Daily   [START ON 06/28/2023] QUEtiapine   12.5 mg Oral BH-q7a  QUEtiapine   50 mg Oral QHS   tamsulosin   0.4 mg Oral Daily   Continuous Infusions:   LOS: 5 days    Time spent: 40 minutes     Renato Applebaum, MD [facility]

## 2023-06-28 DIAGNOSIS — I63413 Cerebral infarction due to embolism of bilateral middle cerebral arteries: Secondary | ICD-10-CM | POA: Diagnosis not present

## 2023-06-28 NOTE — Plan of Care (Signed)
 Patient is not cognitive enough to remember Problem: Ischemic Stroke/TIA Tissue Perfusion: Goal: Complications of ischemic stroke/TIA will be minimized Outcome: Not Progressing   Problem: Coping: Goal: Will verbalize positive feelings about self Outcome: Not Progressing Goal: Will identify appropriate support needs Outcome: Not Progressing

## 2023-06-28 NOTE — Progress Notes (Signed)
 PROGRESS NOTE    Dennis [name]  FMW:[id number]  DOB: [birth year]  DOA: 06/21/2023 PCP: Freddrick, No Outpatient Specialists:   Hospital course:  78 y.o. male with medical history significant of HLD, BPH, HTN, dementia, rheumatoid arthritis, COPD, Barrett's esophagus, OSA does not use CPAP, history of CVA, presented to [facility] ED vis EMS after significant other noted him to be acutely more confused than his baseline and having speech difficulty.  MRI with acute infarct in the (L) posterior subinsular white matter and chronic microvascular ischemic diease.  Subjective:  Patient asleep, arousable by voice alone, opened his eyes and then closed them again.   Objective: Vitals:   06/28/23 0347 06/28/23 0600 06/28/23 0826 06/28/23 1239  BP: 139/67 (!) 138/96 138/68 129/82  Pulse: 66  (!) 57 91  Resp: 18     Temp: 97.7 F (36.5 C)  97.9 F (36.6 C)   TempSrc: Oral  Oral   SpO2: 99%  98% 95%  Weight:      Height:        Intake/Output Summary (Last 24 hours) at 06/28/2023 1347 Last data filed at 06/28/2023 0400 Gross per 24 hour  Intake 240 ml  Output 300 ml  Net -60 ml   Filed Weights   06/21/23 0000  Weight: 60 kg     Exam:  General: Patient lying comfortably in bed in NAD.  As noted above, he opens his eyes to voice alone and then closes them again. Eyes: sclera anicteric, conjuctiva mild injection bilaterally CVS: S1-S2, regular  Respiratory:  decreased air entry bilaterally secondary to decreased inspiratory effort, rales at bases  GI: NABS, soft, NT  LE: Warm and well-perfused   Data Reviewed:  Basic Metabolic Panel: Recent Labs  Lab 06/22/23 1251 06/23/23 0619 06/25/23 0623 06/26/23 0617  NA 137 139 139 142  K 4.0 4.1 3.7 3.9  CL 105 107 107 112*  CO2 23 23 23 23   GLUCOSE 97 102* 105* 98  BUN 11 13 32* 34*  CREATININE 1.31* 1.07 1.89* 1.68*  CALCIUM  9.6 9.6 9.3 9.3    CBC: Recent Labs  Lab 06/22/23 1251 06/23/23 0619 06/25/23 0623  06/26/23 0617  WBC 8.1 10.0 11.3* 9.4  NEUTROABS  --   --  7.5 6.2  HGB 13.4 14.4 13.2 12.6*  HCT 41.2 44.2 41.1 40.1  MCV 85.5 85.5 86.5 88.7  PLT 289 272 286 243     Scheduled Meds:  acetaminophen   1,000 mg Oral Q6H   apixaban   5 mg Oral BID   atorvastatin   80 mg Oral Daily   finasteride   5 mg Oral Daily   hydrALAZINE   50 mg Oral Q8H   isosorbide  mononitrate  30 mg Oral Daily   lidocaine   1 patch Transdermal Q24H   pantoprazole   40 mg Oral Daily   QUEtiapine   12.5 mg Oral BH-q7a   QUEtiapine   50 mg Oral QHS   tamsulosin   0.4 mg Oral Daily   Continuous Infusions:   Assessment & Plan:  Acute ischemic stroke likely cardioembolic DementiaContinue Eliquis  and Lipitor  Aspirin  and Plavix  have been discontinued Continue dysphagia 1 diet Appreciate ongoing PT/OT and speech involvement Awaiting SNF placement  HFrEF Euvolemic at present on GDMT Tolerating carvedilol  which was recently added EF 25%, global hypokinesis  AKI Creatinine was improving when last checked 2 days ago Repeat creatinine in the morning Continue oral hydration  BPH Continue tamsulosin  and finasteride   Disposition Appreciate palliative care involvement Both patient and his significant  other have dementia Await guardianship from social services   DVT prophylaxis: Eliquis  Code Status: Full code Family Communication: None     Studies: No results found.  Principal Problem:   CVA (cerebral vascular accident) (HCC) Active Problems:   HLD (hyperlipidemia)   HTN (hypertension)   BPH (benign prostatic hyperplasia)     Ivan Vangie Pike, [facility]  If 7PM-7AM, please contact night-coverage [email or web address]   LOS: 6 days

## 2023-06-28 NOTE — Plan of Care (Signed)
  Problem: Education: Goal: Knowledge of disease or condition will improve Outcome: Progressing Goal: Knowledge of secondary prevention will improve (MUST DOCUMENT ALL) Outcome: Progressing Goal: Knowledge of patient specific risk factors will improve (DELETE if not current risk factor) Outcome: Progressing   Problem: Ischemic Stroke/TIA Tissue Perfusion: Goal: Complications of ischemic stroke/TIA will be minimized Outcome: Progressing   Problem: Coping: Goal: Will verbalize positive feelings about self Outcome: Progressing Goal: Will identify appropriate support needs Outcome: Progressing   Problem: Health Behavior/Discharge Planning: Goal: Ability to manage health-related needs will improve Outcome: Progressing Goal: Goals will be collaboratively established with patient/family Outcome: Progressing   Problem: Self-Care: Goal: Ability to participate in self-care as condition permits will improve Outcome: Progressing Goal: Verbalization of feelings and concerns over difficulty with self-care will improve Outcome: Progressing Goal: Ability to communicate needs accurately will improve Outcome: Progressing   Problem: Nutrition: Goal: Risk of aspiration will decrease Outcome: Progressing Goal: Dietary intake will improve Outcome: Progressing   Problem: Education: Goal: Knowledge of General Education information will improve Description: Including pain rating scale, medication(s)/side effects and non-pharmacologic comfort measures Outcome: Progressing   Problem: Health Behavior/Discharge Planning: Goal: Ability to manage health-related needs will improve Outcome: Progressing   Problem: Clinical Measurements: Goal: Ability to maintain clinical measurements within normal limits will improve Outcome: Progressing Goal: Will remain free from infection Outcome: Progressing Goal: Diagnostic test results will improve Outcome: Progressing Goal: Respiratory complications will  improve Outcome: Progressing Goal: Cardiovascular complication will be avoided Outcome: Progressing   Problem: Activity: Goal: Risk for activity intolerance will decrease Outcome: Progressing   Problem: Nutrition: Goal: Adequate nutrition will be maintained Outcome: Progressing   Problem: Coping: Goal: Level of anxiety will decrease Outcome: Progressing   Problem: Elimination: Goal: Will not experience complications related to bowel motility Outcome: Progressing Goal: Will not experience complications related to urinary retention Outcome: Progressing   Problem: Pain Managment: Goal: General experience of comfort will improve and/or be controlled Outcome: Progressing   Problem: Safety: Goal: Ability to remain free from injury will improve Outcome: Progressing   Problem: Skin Integrity: Goal: Risk for impaired skin integrity will decrease Outcome: Progressing   Problem: Safety: Goal: Non-violent Restraint(s) Outcome: Progressing

## 2023-06-29 DIAGNOSIS — I63413 Cerebral infarction due to embolism of bilateral middle cerebral arteries: Secondary | ICD-10-CM | POA: Diagnosis not present

## 2023-06-29 LAB — BASIC METABOLIC PANEL
Anion gap: 7 (ref 5–15)
BUN: 34 mg/dL — ABNORMAL HIGH (ref 8–23)
CO2: 23 mmol/L (ref 22–32)
Calcium: 9.3 mg/dL (ref 8.9–10.3)
Chloride: 111 mmol/L (ref 98–111)
Creatinine, Ser: 1.33 mg/dL — ABNORMAL HIGH (ref 0.61–1.24)
GFR, Estimated: 55 mL/min — ABNORMAL LOW (ref >60)
Glucose, Bld: 92 mg/dL (ref 70–99)
Potassium: 3.8 mmol/L (ref 3.5–5.1)
Sodium: 141 mmol/L (ref 135–145)

## 2023-06-29 MED ORDER — HALOPERIDOL LACTATE 5 MG/ML IJ SOLN
5.0000 mg | Freq: Once | INTRAMUSCULAR | Status: AC | PRN
  Administered 2023-06-29: 5 mg via INTRAVENOUS
  Filled 2023-06-29: qty 1

## 2023-06-29 NOTE — Plan of Care (Signed)
  Problem: Education: Goal: Knowledge of disease or condition will improve Outcome: Not Progressing Goal: Knowledge of secondary prevention will improve (MUST DOCUMENT ALL) Outcome: Not Progressing Goal: Knowledge of patient specific risk factors will improve (DELETE if not current risk factor) Outcome: Not Progressing   Problem: Ischemic Stroke/TIA Tissue Perfusion: Goal: Complications of ischemic stroke/TIA will be minimized Outcome: Progressing

## 2023-06-29 NOTE — Progress Notes (Signed)
 Contacted MD for patient's continued left shoulder pain, heat packs seem to improve pain levels. Requested k-pad, orders received, k-pad placed on patient who is resting comfortably.

## 2023-06-29 NOTE — Plan of Care (Signed)
  Problem: Education: Goal: Knowledge of disease or condition will improve Outcome: Not Progressing   Problem: Ischemic Stroke/TIA Tissue Perfusion: Goal: Complications of ischemic stroke/TIA will be minimized Outcome: Progressing

## 2023-06-29 NOTE — Progress Notes (Addendum)
 PROGRESS NOTE    Dennis [name]  FMW:[id number]  DOB: Aug 17, 1945  DOA: 06/21/2023 PCP: Freddrick, No Outpatient Specialists:   Hospital course:  78 y.o. male with medical history significant of HLD, BPH, HTN, dementia, rheumatoid arthritis, COPD, Barrett's esophagus, OSA does not use CPAP, history of CVA, presented to [facility] ED vis EMS after significant other noted him to be acutely more confused than his baseline and having speech difficulty.  MRI with acute infarct in the (L) posterior subinsular white matter and chronic microvascular ischemic diease.  Subjective:  Patient was up and visiting with his wife.  He said he thought he recognized me from yesterday.  He is wondering when he can go home.    Objective: Vitals:   06/29/23 0057 06/29/23 0530 06/29/23 0826 06/29/23 1135  BP: 114/62 127/65 132/69 128/62  Pulse: 80 78 86 97  Resp: 18  18   Temp: 98.1 F (36.7 C) 97.9 F (36.6 C)    TempSrc: Axillary Axillary    SpO2: 96% 96% 95% 94%  Weight:      Height:        Intake/Output Summary (Last 24 hours) at 06/29/2023 1612 Last data filed at 06/29/2023 0600 Gross per 24 hour  Intake --  Output 900 ml  Net -900 ml   Filed Weights   06/21/23 0000  Weight: 60 kg     Exam:  General: Patient sitting up in bed chatting with wife and joking with his nurses Eyes: sclera anicteric, conjuctiva mild injection bilaterally CVS: S1-S2, regular  Respiratory:  decreased air entry bilaterally secondary to decreased inspiratory effort, rales at bases  GI: NABS, soft, NT  LE: Warm and well-perfused   Data Reviewed:  Basic Metabolic Panel: Recent Labs  Lab 06/23/23 0619 06/25/23 0623 06/26/23 0617 06/29/23 0706  NA 139 139 142 141  K 4.1 3.7 3.9 3.8  CL 107 107 112* 111  CO2 23 23 23 23   GLUCOSE 102* 105* 98 92  BUN 13 32* 34* 34*  CREATININE 1.07 1.89* 1.68* 1.33*  CALCIUM  9.6 9.3 9.3 9.3    CBC: Recent Labs  Lab 06/23/23 0619 06/25/23 0623 06/26/23 0617  WBC  10.0 11.3* 9.4  NEUTROABS  --  7.5 6.2  HGB 14.4 13.2 12.6*  HCT 44.2 41.1 40.1  MCV 85.5 86.5 88.7  PLT 272 286 243     Scheduled Meds:  apixaban   5 mg Oral BID   atorvastatin   80 mg Oral Daily   finasteride   5 mg Oral Daily   hydrALAZINE   50 mg Oral Q8H   isosorbide  mononitrate  30 mg Oral Daily   lidocaine   1 patch Transdermal Q24H   pantoprazole   40 mg Oral Daily   QUEtiapine   12.5 mg Oral BH-q7a   QUEtiapine   50 mg Oral QHS   tamsulosin   0.4 mg Oral Daily   Continuous Infusions:   Assessment & Plan:  AKI Creatinine continues to improve, it is 1.3 today, down from high of 1.9 Patient is hydrating orally and has a good appetite  Acute ischemic stroke likely cardioembolic Dementia Continue Eliquis  and Lipitor  Aspirin  and Plavix  have been discontinued Continue dysphagia 1 diet Appreciate ongoing PT/OT and speech involvement Awaiting SNF placement  HFrEF Euvolemic at present on GDMT Tolerating carvedilol  which was recently added EF 25%, global hypokinesis BPH Continue tamsulosin  and finasteride   Disposition Appreciate palliative care involvement Both patient and his significant other have dementia Await guardianship from social services   DVT prophylaxis:  Eliquis  Code Status: Full code Family Communication: None     Studies: No results found.  Principal Problem:   CVA (cerebral vascular accident) (HCC) Active Problems:   HLD (hyperlipidemia)   HTN (hypertension)   BPH (benign prostatic hyperplasia)     Dennis [name], [facility]  If 7PM-7AM, please contact night-coverage [email or web address]   LOS: 7 days

## 2023-06-29 NOTE — Plan of Care (Signed)
  Problem: Education: Goal: Knowledge of disease or condition will improve Outcome: Progressing Goal: Knowledge of secondary prevention will improve (MUST DOCUMENT ALL) Outcome: Progressing Goal: Knowledge of patient specific risk factors will improve (DELETE if not current risk factor) Outcome: Progressing   Problem: Ischemic Stroke/TIA Tissue Perfusion: Goal: Complications of ischemic stroke/TIA will be minimized Outcome: Progressing   Problem: Coping: Goal: Will verbalize positive feelings about self Outcome: Progressing Goal: Will identify appropriate support needs Outcome: Progressing   Problem: Health Behavior/Discharge Planning: Goal: Ability to manage health-related needs will improve Outcome: Progressing Goal: Goals will be collaboratively established with patient/family Outcome: Progressing   Problem: Self-Care: Goal: Ability to participate in self-care as condition permits will improve Outcome: Progressing Goal: Verbalization of feelings and concerns over difficulty with self-care will improve Outcome: Progressing Goal: Ability to communicate needs accurately will improve Outcome: Progressing   Problem: Nutrition: Goal: Risk of aspiration will decrease Outcome: Progressing Goal: Dietary intake will improve Outcome: Progressing   Problem: Education: Goal: Knowledge of General Education information will improve Description: Including pain rating scale, medication(s)/side effects and non-pharmacologic comfort measures Outcome: Progressing   Problem: Health Behavior/Discharge Planning: Goal: Ability to manage health-related needs will improve Outcome: Progressing   Problem: Clinical Measurements: Goal: Ability to maintain clinical measurements within normal limits will improve Outcome: Progressing Goal: Will remain free from infection Outcome: Progressing Goal: Diagnostic test results will improve Outcome: Progressing Goal: Respiratory complications will  improve Outcome: Progressing Goal: Cardiovascular complication will be avoided Outcome: Progressing   Problem: Activity: Goal: Risk for activity intolerance will decrease Outcome: Progressing   Problem: Nutrition: Goal: Adequate nutrition will be maintained Outcome: Progressing   Problem: Coping: Goal: Level of anxiety will decrease Outcome: Progressing   Problem: Elimination: Goal: Will not experience complications related to bowel motility Outcome: Progressing Goal: Will not experience complications related to urinary retention Outcome: Progressing   Problem: Pain Managment: Goal: General experience of comfort will improve and/or be controlled Outcome: Progressing   Problem: Safety: Goal: Ability to remain free from injury will improve Outcome: Progressing   Problem: Skin Integrity: Goal: Risk for impaired skin integrity will decrease Outcome: Progressing   Problem: Safety: Goal: Non-violent Restraint(s) Outcome: Progressing

## 2023-06-30 DIAGNOSIS — I639 Cerebral infarction, unspecified: Secondary | ICD-10-CM | POA: Diagnosis not present

## 2023-06-30 LAB — BASIC METABOLIC PANEL
Anion gap: 9 (ref 5–15)
BUN: 27 mg/dL — ABNORMAL HIGH (ref 8–23)
CO2: 26 mmol/L (ref 22–32)
Calcium: 9.7 mg/dL (ref 8.9–10.3)
Chloride: 108 mmol/L (ref 98–111)
Creatinine, Ser: 1.15 mg/dL (ref 0.61–1.24)
GFR, Estimated: >60 mL/min (ref >60)
Glucose, Bld: 92 mg/dL (ref 70–99)
Potassium: 4 mmol/L (ref 3.5–5.1)
Sodium: 143 mmol/L (ref 135–145)

## 2023-06-30 MED ORDER — QUETIAPINE FUMARATE 50 MG PO TABS
50.0000 mg | ORAL_TABLET | Freq: Every day | ORAL | Status: DC
  Administered 2023-06-30 – 2023-08-13 (×45): 50 mg via ORAL
  Filled 2023-06-30 (×45): qty 1

## 2023-06-30 NOTE — Progress Notes (Signed)
 Speech Language Pathology Treatment: Dysphagia  Patient Details Name: Dennis [name] MRN: [id number] DOB: 10-Feb-1946 Today's Date: 06/30/2023 Time: 4098-1191 SLP Time Calculation (min) (ACUTE ONLY): 19 min  Assessment / Plan / Recommendation Clinical Impression  Dennis [name] is much more alert and participatory.  Mentation allows better communication. No further concern for a significant aphasia - he is able to make his needs known, expressing himself in complete sentences. He is still dysarthric but speech is 80% intelligible. During our session his questions were appropriate and relevant. After set-up, he fed himself 100% of meal today.  Trials of thin liquid elicited occasional coughing - it would be beneficial to repeat his MBS to determine readiness for diet advancement (last study was on 2/2). Will plan for study in the next 24-48 hours.  Recommend dysphagia 1, nectar thick liquids for now.    HPI HPI: Pt is a 78 y.o. male presented to ED after significant other noted him to be acutely more confused than his baseline and having speech difficulty. EMS noted him to have (L) facial droop and expressive aphasia as well as hypertensive en route to ED. MRI (06/21/23) revealed, "Punctate acute infarct in the left posterior subinsular white matter... small area of acute infarct in the more superior left frontal cortex. There also 2 punctate areas of diffusion hyperintensity without ADC correlate in the superior right frontal lobe which may be areas of subacute ischemia". Previous acute SLP services provided for dysphagia on 03/03/23, recommended advancement of diet to dys 3/thin liquids given improvement in mentation and respiratory status. PMH: HLD, BPH, HTN, dementia, rheumatoid arthritis, COPD, Barrett's esophagus, OSA does not use CPAP, history of CVA.      SLP Plan  Continue with current plan of care      Recommendations for follow up therapy are one component of a multi-disciplinary discharge  planning process, led by the attending physician.  Recommendations may be updated based on patient status, additional functional criteria and insurance authorization.    Recommendations  Diet recommendations: Dysphagia 1 (puree);Nectar-thick liquid Liquids provided via: Cup;Teaspoon Medication Administration: Crushed with puree Supervision: Patient able to self feed Postural Changes and/or Swallow Maneuvers: Seated upright 90 degrees                  Oral care BID   Frequent or constant Supervision/Assistance Dysphagia, oropharyngeal phase (R13.12)     Continue with current plan of care   Dennis Begue L. Beatris Lincoln, MA CCC/SLP Clinical Specialist - Acute Care SLP Acute Rehabilitation Services Office number [phone]   Dennis [name]  06/30/2023, 2:30 PM

## 2023-06-30 NOTE — Progress Notes (Signed)
 Progress Note   Patient: Dennis [name] AVW:[id number] DOB: 03/31/1946 DOA: 06/21/2023     8 DOS: the patient was seen and examined on 06/30/2023   Brief hospital course: 78yo with h/o HLD, BPH, HTN, dementia, RA, COPD, OSA not on CPAP, and CVA who presented on 2/1 with AMS, dysarthria.  He was found to have an acute infarct in the L posterior subinsular white matter on MRI.  He and his significant other both have dementia, awaiting guardianship from social services.  Assessment and Plan:  CVA Presented with AMS and dysarthria Aspirin  and Plavix  have been discontinued Continue Eliquis  and Lipitor  Continue dysphagia 1 diet with nectar-thick liquid Appreciate ongoing PT/OT and speech involvement Awaiting SNF placement  AKI Patient is hydrating orally and has a good appetite AKI has resolved   Dementia Patient lives with wife, who also has dementia DSS is pursuing guardianship for both Once guardianship is obtained, he will need placement DSS is also working on LTC Medicaid for him   HFrEF Euvolemic at present on GDMT Tolerating carvedilol  which was recently added EF 25%, global hypokinesis  BPH Continue tamsulosin  and finasteride      Consultants: Neurology Cardiology Palliative care PT OT SLP TOC tem  Procedures: Echocardiogram 2/1 DVT US  2/1  Antibiotics: None  30 Day Unplanned Readmission Risk Score    Flowsheet Row ED to Hosp-Admission (Current) from 06/21/2023 in [facility]  30 Day Unplanned Readmission Risk Score (%) 29.15 Filed at 06/30/2023 0801       This score is the patient's risk of an unplanned readmission within 30 days of being discharged (0 -100%). The score is based on dignosis, age, lab data, medications, orders, and past utilization.   Low:  0-14.9   Medium: 15-21.9   High: 22-29.9   Extreme: 30 and above           Subjective: No complaints.   Objective: Vitals:   06/30/23 0016 06/30/23 0522  BP:  (!) 172/90   Pulse:  (!) 103  Resp:  18  Temp: 97.9 F (36.6 C) 99.5 F (37.5 C)  SpO2:  96%    Intake/Output Summary (Last 24 hours) at 06/30/2023 0840 Last data filed at 06/30/2023 0533 Gross per 24 hour  Intake --  Output 1100 ml  Net -1100 ml   Filed Weights   06/21/23 0000  Weight: 60 kg    Exam:  General:  Appears calm and comfortable and is in NAD Eyes:  EOMI, normal lids, iris ENT:  grossly normal hearing, lips & tongue, mmm Neck:  no LAD, masses or thyromegaly Cardiovascular:  RRR, no m/r/g. No LE edema.  Respiratory:   CTA bilaterally with no wheezes/rales/rhonchi.  Normal respiratory effort. Abdomen:  soft, NT, ND Skin:  no rash or induration seen on limited exam Musculoskeletal:  symmetric-appearing BUE/BLE, no bony abnormality Psychiatric:  pleasant mood and affect, speech mostly unintelligible Neurologic:  R facial droop, little movement of extremities and does not follow commands  Data Reviewed: I have reviewed the patient's lab results since admission.  Pertinent labs for today include:   Unremarkable BMP     Family Communication: None present  Disposition: Status is: Inpatient Remains inpatient appropriate because: awaiting guardianship and then placement     Time spent: 35 minutes  Unresulted Labs (From admission, onward)    None        Author: Lorita Rosa, MD 06/30/2023 8:40 AM  For on call review [email or web address].

## 2023-06-30 NOTE — Hospital Course (Addendum)
 78 years old male with past medical history of hyperlipidemia, BPH, HTN, dementia, RA, COPD, OSA not on CPAP, and CVA who presented to the hospital on 2/1 with mental status and dysarthria. He was found to have an acute infarct in the L posterior subinsular white matter on MRI. He and his significant other both have dementia, awaiting guardianship from social services.

## 2023-06-30 NOTE — TOC Progression Note (Addendum)
 Transition of Care [facility]) - Progression Note    Patient Details  Name: Dennis [name] MRN: [id number] Date of Birth: [birth year]  [facility]) CM/SW Contact  Tandy Fam, [place] Phone Number: 06/30/2023, 10:29 AM  Clinical Narrative:    CSW spoke with [facility] with DSS about guardianship and status of patient's wife. DSS is aware that patient's wife does not have a caregiver at this time and they are working on guardianship for her, as well. Nichole has not yet sent the request for records for patient's guardianship status but will send as soon as able to get the guardianship in process. CSW discussed placement recommendations and likely need for memory care for both patient and wife, Dennis [name] to work on [employer] for patient, as well. CSW to follow.   UPDATE: CSW received records request from DSS. CSW placed in patient's chart, and faxed records to DSS to support guardianship. CSW to follow.   Expected Discharge Plan: Skilled Nursing Facility Barriers to Discharge: Insurance Authorization, SNF Pending bed offer, Continued Medical Work up, Unsafe home situation  Expected Discharge Plan and Services     Post Acute Care Choice: Skilled Nursing Facility Living arrangements for the past 2 months: Single Family Home                                       Social Determinants of Health (SDOH) Interventions SDOH Screenings   Food Insecurity: No Food Insecurity (06/21/2023)  Housing: Low Risk  (06/21/2023)  Transportation Needs: No Transportation Needs (06/21/2023)  Utilities: Not At Risk (06/21/2023)  Social Connections: Moderately Isolated (06/21/2023)  Tobacco Use: Medium Risk (06/21/2023)    Readmission Risk Interventions     No data to display

## 2023-06-30 NOTE — Progress Notes (Signed)
 Notified MD overnight for increasing agitation. Patient unable to redirected, constantly trying to get out bed, yelling out for his wife, attempting to leave. Haldol  ordered and given, decrease in agitation noted.

## 2023-07-01 ENCOUNTER — Inpatient Hospital Stay (HOSPITAL_COMMUNITY): Payer: 59

## 2023-07-01 DIAGNOSIS — I639 Cerebral infarction, unspecified: Secondary | ICD-10-CM | POA: Diagnosis not present

## 2023-07-01 LAB — BASIC METABOLIC PANEL
Anion gap: 11 (ref 5–15)
BUN: 23 mg/dL (ref 8–23)
CO2: 23 mmol/L (ref 22–32)
Calcium: 9.4 mg/dL (ref 8.9–10.3)
Chloride: 107 mmol/L (ref 98–111)
Creatinine, Ser: 1.24 mg/dL (ref 0.61–1.24)
GFR, Estimated: 60 mL/min — ABNORMAL LOW (ref >60)
Glucose, Bld: 100 mg/dL — ABNORMAL HIGH (ref 70–99)
Potassium: 4.2 mmol/L (ref 3.5–5.1)
Sodium: 141 mmol/L (ref 135–145)

## 2023-07-01 LAB — CBC WITH DIFFERENTIAL/PLATELET
Abs Immature Granulocytes: 0.03 10*3/uL (ref 0.00–0.07)
Basophils Absolute: 0 10*3/uL (ref 0.0–0.1)
Basophils Relative: 1 %
Eosinophils Absolute: 0.1 10*3/uL (ref 0.0–0.5)
Eosinophils Relative: 1 %
HCT: 39.6 % (ref 39.0–52.0)
Hemoglobin: 12.5 g/dL — ABNORMAL LOW (ref 13.0–17.0)
Immature Granulocytes: 1 %
Lymphocytes Relative: 23 %
Lymphs Abs: 1.3 10*3/uL (ref 0.7–4.0)
MCH: 27.6 pg (ref 26.0–34.0)
MCHC: 31.6 g/dL (ref 30.0–36.0)
MCV: 87.4 fL (ref 80.0–100.0)
Monocytes Absolute: 0.7 10*3/uL (ref 0.1–1.0)
Monocytes Relative: 12 %
Neutro Abs: 3.3 10*3/uL (ref 1.7–7.7)
Neutrophils Relative %: 62 %
Platelets: 243 10*3/uL (ref 150–400)
RBC: 4.53 MIL/uL (ref 4.22–5.81)
RDW: 15.9 % — ABNORMAL HIGH (ref 11.5–15.5)
WBC: 5.4 10*3/uL (ref 4.0–10.5)
nRBC: 0 % (ref 0.0–0.2)

## 2023-07-01 NOTE — Procedures (Signed)
Modified Barium Swallow Study  Patient Details  Name: Dennis [name] MRN: [id number] Date of Birth: 09-Jan-1946  Today's Date: 07/01/2023  Modified Barium Swallow completed.  Full report located under Chart Review in the Imaging Section.  History of Present Illness Pt is a 78 y.o. male presented to ED after significant other noted him to be acutely more confused than his baseline and having speech difficulty. EMS noted him to have (L) facial droop and expressive aphasia as well as hypertensive en route to ED. MRI (06/21/23) revealed, "Punctate acute infarct in the left posterior subinsular white matter... small area of acute infarct in the more superior left frontal cortex. There also 2 punctate areas of diffusion hyperintensity without ADC correlate in the superior right frontal lobe which may be areas of subacute ischemia". Previous acute SLP services provided for dysphagia on 03/03/23, recommended advancement of diet to dys 3/thin liquids given improvement in mentation and respiratory status. PMH: HLD, BPH, HTN, dementia, rheumatoid arthritis, COPD, Barrett's esophagus, OSA does not use CPAP, history of CVA.   Clinical Impression Patient presents with significant improvement in swallow function with improved cognition likely impacting his performance. During today's study, he was able to sit still and follow directions as opposed to study from a week ago which reported excessive movements, restlessness and decreased attention. During today's study, patient exhibited flash penetration with sips of thin liquid barium (PAS 2) but with full clearance of penetrate and with no aspiration events observed even with straw sips of thin liquids and mixed consistencies (sips of thin while finishing up mastication of graham cracker). In addition, trace to no pharyngeal residuals remained after initial swallows of liquids and solids. Oral phase was delayed with liquids and solids and mastication was significantly  delayed with mechanical soft solids. SLP is recommending to advance patient's diet to Dys 2 (minced) solids an thin liquids. Factors that may increase risk of adverse event in presence of aspiration [name] 2021): Reduced cognitive function  Swallow Evaluation Recommendations Recommendations: PO diet PO Diet Recommendation: Dysphagia 2 (Finely chopped);Thin liquids (Level 0) Liquid Administration via: Cup;Straw Medication Administration: Whole meds with puree Supervision: Patient able to self-feed;Intermittent supervision/cueing for swallowing strategies;Set-up assistance for safety Swallowing strategies  : Small bites/sips;Slow rate;Check for pocketing or oral holding Postural changes: Position pt fully upright for meals Oral care recommendations: Oral care BID (2x/day)     Angela Nevin, MA, CCC-SLP Speech Therapy

## 2023-07-01 NOTE — Progress Notes (Signed)
Occupational Therapy Treatment Patient Details Name: Dennis [name] MRN: [id number] DOB: [birth year] Today's Date: 07/01/2023   History of present illness The pt is a 78 yo male presenting 2/1 from home with concern for stroke (slurred speech and mild L facial droop). MRI showed:  small area of acute infarct in the more superior left frontal cortex and 2 areas of subacute ischemia in superior R frontal lobe. PMH includes: dementia, arthritis, HTN, HLD, RA, COPD, OSA, and CVA.   OT comments  Pt is making great progress towards their acute OT goals. Pt completed all mobility with superivsion - CGA and HHA for safety only. HHA provided as pt does not use a RW at baseline. He tolerated >10 minutes OOB activity without sitting rest break. He demonstrated improved cognition, he followed all simple 1 step commands. Grooming completed at the sink with nectar thick liquids and cues for safety. Anticipate pt is getting close to his functional baseline. OT to continue to follow acutely to facilitate progress towards established goals. Pt will continue to benefit from skilled inpatient follow up therapy, <3 hours/day.       If plan is discharge home, recommend the following:  A lot of help with walking and/or transfers;A lot of help with bathing/dressing/bathroom;Assistance with cooking/housework;Direct supervision/assist for medications management;Direct supervision/assist for financial management;Help with stairs or ramp for entrance;Assist for transportation;Assistance with feeding;Supervision due to cognitive status   Equipment Recommendations  Other (comment)       Precautions / Restrictions Precautions Precautions: Fall Restrictions Weight Bearing Restrictions Per Provider Order: No       Mobility Bed Mobility Overal bed mobility: Needs Assistance Bed Mobility: Supine to Sit, Sit to Supine     Supine to sit: Supervision, HOB elevated Sit to supine: Supervision, HOB elevated   General bed  mobility comments: cues provided    Transfers Overall transfer level: Needs assistance Equipment used: 1 person hand held assist Transfers: Sit to/from Stand Sit to Stand: Contact guard assist                 Balance Overall balance assessment: Needs assistance Sitting-balance support: No upper extremity supported Sitting balance-Leahy Scale: Fair     Standing balance support: No upper extremity supported, During functional activity Standing balance-Leahy Scale: Fair                             ADL either performed or assessed with clinical judgement   ADL Overall ADL's : Needs assistance/impaired     Grooming: Minimal assistance Grooming Details (indicate cue type and reason): min A for cues to use nectar thick liquids                 Toilet Transfer: Contact guard assist;Ambulation Toilet Transfer Details (indicate cue type and reason): HHA for safety only. simulated         Functional mobility during ADLs: Contact guard assist General ADL Comments: cues for way finding during hallway mobility    Extremity/Trunk Assessment Upper Extremity Assessment Upper Extremity Assessment: Overall WFL for tasks assessed (seemingly [facility] for groomingand functional tasks assessed)   Lower Extremity Assessment Lower Extremity Assessment: Defer to PT evaluation        Vision   Vision Assessment?: No apparent visual deficits   Perception Perception Perception: Not tested   Praxis Praxis Praxis: Not tested   Communication Communication Communication: No apparent difficulties   Cognition Arousal: Alert Behavior During Therapy: Flat affect Cognition:  History of cognitive impairments             OT - Cognition Comments: followed all one step commands, sequenced grooming at the sink approrpiately. oriented to self only, able to select year and place when given options.                 Following commands: Intact Following commands  impaired: Only follows one step commands consistently, Follows multi-step commands inconsistently               General Comments VSS    Pertinent Vitals/ Pain       Pain Assessment Pain Assessment: No/denies pain   Frequency  Min 1X/week        Progress Toward Goals  OT Goals(current goals can now be found in the care plan section)  Progress towards OT goals: Progressing toward goals  Acute Rehab OT Goals Patient Stated Goal: to call wife OT Goal Formulation: With patient Time For Goal Achievement: 07/06/23 Potential to Achieve Goals: Good ADL Goals Pt Will Perform Grooming: with supervision;standing Pt Will Perform Lower Body Dressing: with supervision;sit to/from stand;sitting/lateral leans Pt Will Transfer to Toilet: with supervision;ambulating;bedside commode Pt Will Perform Toileting - Clothing Manipulation and hygiene: with supervision;sitting/lateral leans;sit to/from stand Additional ADL Goal #1: Pt will follow 2 step commands with increased time.   AM-PAC OT "6 Clicks" Daily Activity     Outcome Measure   Help from another person eating meals?: A Little Help from another person taking care of personal grooming?: A Little Help from another person toileting, which includes using toliet, bedpan, or urinal?: A Little Help from another person bathing (including washing, rinsing, drying)?: A Lot Help from another person to put on and taking off regular upper body clothing?: A Little Help from another person to put on and taking off regular lower body clothing?: A Lot 6 Click Score: 16    End of Session Equipment Utilized During Treatment: Gait belt  OT Visit Diagnosis: Other abnormalities of gait and mobility (R26.89);Muscle weakness (generalized) (M62.81);Other symptoms and signs involving cognitive function   Activity Tolerance Patient tolerated treatment well   Patient Left in bed;with call bell/phone within reach;with bed alarm set   Nurse Communication  Mobility status        Time: 6578-4696 OT Time Calculation (min): 23 min  Charges: OT General Charges $OT Visit: 1 Visit OT Treatments $Self Care/Home Management : 8-22 mins $Therapeutic Activity: 8-22 mins  Derenda Mis, OTR/L Acute Rehabilitation Services Office [phone] Secure Chat Communication Preferred   [name] 07/01/2023, 1:26 PM

## 2023-07-01 NOTE — Progress Notes (Signed)
PT Cancellation Note  Patient Details Name: Dennis [name] MRN: [id number] DOB: [birth year]   Cancelled Treatment:    Reason Eval/Treat Not Completed: (P) Patient at procedure or test/unavailable, pt off unit for modified barium swallow. Will check back as schedule allows to continue with PT POC.  Lenora Boys. PTA Acute Rehabilitation Services Office: [phone]    Catalina Antigua 07/01/2023, 2:34 PM

## 2023-07-01 NOTE — Progress Notes (Signed)
Palliative-   Chart reviewed- noted patient is stable for discharge. Awaiting DSS guardianship.  PMT will continue to check in intermittently for establishment of decision maker.   Ocie Bob, AGNP-C Palliative Medicine  No charge

## 2023-07-01 NOTE — Plan of Care (Signed)
  Problem: Safety: Goal: Ability to remain free from injury will improve Outcome: Progressing   Assess/Monitor signs of activity intolerance Encourage mobilization to extent of ability Provide rest periods

## 2023-07-01 NOTE — Progress Notes (Signed)
Progress Note   Patient: Dennis [name] ZOX:[id number] DOB: [birth year] DOA: 06/21/2023     9 DOS: the patient was seen and examined on 07/01/2023   Brief hospital course: 78yo with h/o HLD, BPH, HTN, dementia, RA, COPD, OSA not on CPAP, and CVA who presented on 2/1 with AMS, dysarthria.  He was found to have an acute infarct in the L posterior subinsular white matter on MRI.  He and his significant other both have dementia, awaiting guardianship from social services.  Assessment and Plan:  CVA Presented with AMS and dysarthria Aspirin and Plavix have been discontinued Continue Eliquis and Lipitor Continue dysphagia 1 diet with nectar-thick liquid Appreciate ongoing PT/OT and speech involvement Awaiting SNF placement   Dementia Patient lives with wife, who also has dementia DSS is pursuing guardianship for both Once guardianship is obtained, he will need placement DSS is also working on LTC Medicaid for him   HFrEF Euvolemic at present on GDMT Tolerating carvedilol which was recently added EF 25%, global hypokinesis   BPH Continue tamsulosin and finasteride         Consultants: Neurology Cardiology Palliative care PT OT SLP TOC tem   Procedures: Echocardiogram 2/1 DVT US 2/1   Antibiotics: None  30 Day Unplanned Readmission Risk Score    Flowsheet Row ED to Hosp-Admission (Current) from 06/21/2023 in [facility]  30 Day Unplanned Readmission Risk Score (%) 25.88 Filed at 07/01/2023 0401       This score is the patient's risk of an unplanned readmission within 30 days of being discharged (0 -100%). The score is based on dignosis, age, lab data, medications, orders, and past utilization.   Low:  0-14.9   Medium: 15-21.9   High: 22-29.9   Extreme: 30 and above           Subjective: More interactive today.  Asked why he is "tied down" (has a posey belt and seems to understand that he was agitated last night and needed it for safety). Also  reports that he is hungry.   Objective: Vitals:   07/01/23 0427 07/01/23 0732  BP: 118/78 111/76  Pulse: 91 92  Resp: 18 16  Temp: 98 F (36.7 C) 97.9 F (36.6 C)  SpO2: 99% 98%    Intake/Output Summary (Last 24 hours) at 07/01/2023 8119 Last data filed at 06/30/2023 1641 Gross per 24 hour  Intake --  Output 300 ml  Net -300 ml   Filed Weights   06/21/23 0000  Weight: 60 kg    Exam:  General:  Appears calm and comfortable and is in NAD Eyes:  EOMI, normal lids, iris ENT:  grossly normal hearing, lips & tongue, mmm Neck:  no LAD, masses or thyromegaly Cardiovascular:  RRR, no m/r/g. No LE edema.  Respiratory:   CTA bilaterally with no wheezes/rales/rhonchi.  Normal respiratory effort. Abdomen:  soft, NT, ND Skin:  no rash or induration seen on limited exam Musculoskeletal:  symmetric-appearing BUE/BLE, no bony abnormality Psychiatric:  pleasant mood and affect, speech mildly dysarthric and quiet but not unintelligible today Neurologic:  Able to follow commands and strength appears symmetric today  Data Reviewed: I have reviewed the patient's lab results since admission.  Pertinent labs for today include:  Unremarkable BMP Stable CBC     Family Communication: None present  Disposition: Status is: Inpatient Remains inpatient appropriate because: awaiting guardianship and then placement     Time spent: 35 minutes  Unresulted Labs (From admission, onward)  None        Author: Jonah Blue, MD 07/01/2023 8:03 AM  For on call review [email or web address].

## 2023-07-02 DIAGNOSIS — F03918 Unspecified dementia, unspecified severity, with other behavioral disturbance: Secondary | ICD-10-CM | POA: Diagnosis present

## 2023-07-02 DIAGNOSIS — I639 Cerebral infarction, unspecified: Secondary | ICD-10-CM | POA: Diagnosis not present

## 2023-07-02 DIAGNOSIS — R1310 Dysphagia, unspecified: Secondary | ICD-10-CM

## 2023-07-02 NOTE — Plan of Care (Signed)
  Problem: Ischemic Stroke/TIA Tissue Perfusion: Goal: Complications of ischemic stroke/TIA will be minimized Outcome: Progressing   Problem: Coping: Goal: Will identify appropriate support needs Outcome: Progressing   Problem: Health Behavior/Discharge Planning: Goal: Goals will be collaboratively established with patient/family Outcome: Progressing   Problem: Self-Care: Goal: Ability to communicate needs accurately will improve Outcome: Progressing   Problem: Nutrition: Goal: Risk of aspiration will decrease Outcome: Progressing Goal: Dietary intake will improve Outcome: Progressing   Problem: Clinical Measurements: Goal: Respiratory complications will improve Outcome: Progressing   Problem: Coping: Goal: Level of anxiety will decrease Outcome: Progressing   Problem: Elimination: Goal: Will not experience complications related to bowel motility Outcome: Progressing Goal: Will not experience complications related to urinary retention Outcome: Progressing   Problem: Skin Integrity: Goal: Risk for impaired skin integrity will decrease Outcome: Progressing

## 2023-07-02 NOTE — Progress Notes (Signed)
Progress Note   Patient: Dennis [name]:[id number] DOB: 1945-08-15 DOA: 06/21/2023     10 DOS: the patient was seen and examined on 07/02/2023   Brief hospital course: 77yo with h/o HLD, BPH, HTN, dementia, RA, COPD, OSA not on CPAP, and CVA who presented on 2/1 with AMS, dysarthria.  He was found to have an acute infarct in the L posterior subinsular white matter on MRI.  He and his significant other both have dementia, awaiting guardianship from social services.  Assessment and Plan:  CVA Presented with AMS and dysarthria Aspirin and Plavix have been discontinued Continue Eliquis and Lipitor Appreciate ongoing PT/OT and speech involvement Awaiting SNF placement  Dysphagia SLP consulting Recommended for dysphagia 1 diet with nectar-thick liquid Underwent MBS on 2/11 with significantly improved swallow function associated with improved cognition Diet advanced to dysphagia 2 with thin liquids   Dementia Patient lives with wife, who also has dementia DSS is pursuing guardianship for both Once guardianship is obtained, he will need placement DSS is also working on LTC Medicaid for him   HFrEF Euvolemic at present on GDMT Tolerating carvedilol which was recently added EF 25%, global hypokinesis   BPH Continue tamsulosin and finasteride         Consultants: Neurology Cardiology Palliative care PT OT SLP TOC tem   Procedures: Echocardiogram 2/1 DVT US 2/1 Modified barium swallow evaluation 2/12   Antibiotics: None  30 Day Unplanned Readmission Risk Score    Flowsheet Row ED to Hosp-Admission (Current) from 06/21/2023 in [facility]  30 Day Unplanned Readmission Risk Score (%) 25.61 Filed at 07/02/2023 0801       This score is the patient's risk of an unplanned readmission within 30 days of being discharged (0 -100%). The score is based on dignosis, age, lab data, medications, orders, and past utilization.   Low:  0-14.9   Medium: 15-21.9    High: 22-29.9   Extreme: 30 and above           Subjective: Pleasant, more conversant today but mildly confused.   Objective: Vitals:   07/02/23 1120 07/02/23 1333  BP: (!) 108/55 (!) 123/91  Pulse: (!) 104   Resp:    Temp: 97.8 F (36.6 C)   SpO2: 92%     Intake/Output Summary (Last 24 hours) at 07/02/2023 1454 Last data filed at 07/02/2023 1330 Gross per 24 hour  Intake --  Output 800 ml  Net -800 ml   Filed Weights   06/21/23 0000  Weight: 60 kg    Exam:  General:  Appears calm and comfortable and is in NAD Eyes:  EOMI, normal lids, iris ENT:  grossly normal hearing, lips & tongue, mmm Neck:  no LAD, masses or thyromegaly Cardiovascular:  RRR, no m/r/g. No LE edema.  Respiratory:   CTA bilaterally with no wheezes/rales/rhonchi.  Normal respiratory effort. Abdomen:  soft, NT, ND Skin:  no rash or induration seen on limited exam Musculoskeletal:  symmetric-appearing BUE/BLE, no bony abnormality Psychiatric:  pleasant mood and affect, speech mildly dysarthric and quiet but not unintelligible today Neurologic:  Able to follow commands and strength appears symmetric today  Data Reviewed: I have reviewed the patient's lab results since admission.  Pertinent labs for today include:     None today   Family Communication: None present  Disposition: Status is: Inpatient Remains inpatient appropriate because: needs placement     Time spent: 25 minutes  Unresulted Labs (From admission, onward)  None        Author: Jonah Blue, MD 07/02/2023 2:54 PM  For on call review [email or web address].

## 2023-07-02 NOTE — Progress Notes (Signed)
Speech Language Pathology Treatment: Dysphagia  Patient Details Name: Dennis [name] MRN: [id number] DOB: 02-14-1946 Today's Date: 07/02/2023 Time: 1355-1410 SLP Time Calculation (min) (ACUTE ONLY): 15 min  Assessment / Plan / Recommendation Clinical Impression  Patient seen by SLP for skilled treatment focused on dysphagia goals. When SLP arrived into room, patient with lunch meal tray in front of him and was feeding himself. He was finishing up the dysphagia 2 solids on plate except for the minced broccoli, telling SLP he didn't like it. Mildly prolonged mastication observed but full clearance of PO's from oral cavity. No overt s/s aspiration with thin liquids. Patient continues to hold a trace amount of liquids in anterior portion of mouth but no observe impact on swallow safety. SLP recommending continue with current PO diet and will follow to ensure toleration.    HPI HPI: Pt is a 78 y.o. male presented to ED after significant other noted him to be acutely more confused than his baseline and having speech difficulty. EMS noted him to have (L) facial droop and expressive aphasia as well as hypertensive en route to ED. MRI (06/21/23) revealed, "Punctate acute infarct in the left posterior subinsular white matter... small area of acute infarct in the more superior left frontal cortex. There also 2 punctate areas of diffusion hyperintensity without ADC correlate in the superior right frontal lobe which may be areas of subacute ischemia". Previous acute SLP services provided for dysphagia on 03/03/23, recommended advancement of diet to dys 3/thin liquids given improvement in mentation and respiratory status. PMH: HLD, BPH, HTN, dementia, rheumatoid arthritis, COPD, Barrett's esophagus, OSA does not use CPAP, history of CVA.      SLP Plan  Continue with current plan of care      Recommendations for follow up therapy are one component of a multi-disciplinary discharge planning process, led by the  attending physician.  Recommendations may be updated based on patient status, additional functional criteria and insurance authorization.    Recommendations  Diet recommendations: Thin liquid;Dysphagia 2 (fine chop) Liquids provided via: Cup;Straw Medication Administration: Whole meds with puree Supervision: Patient able to self feed Compensations: Slow rate;Small sips/bites;Minimize environmental distractions Postural Changes and/or Swallow Maneuvers: Seated upright 90 degrees                  Oral care BID   Intermittent Supervision/Assistance Dysphagia, oropharyngeal phase (R13.12)     Continue with current plan of care    Angela Nevin, MA, CCC-SLP Speech Therapy

## 2023-07-02 NOTE — Progress Notes (Signed)
Physical Therapy Treatment Patient Details Name: Dennis [name] MRN: [id number] DOB: Dec 11, 1945 Today's Date: 07/02/2023   History of Present Illness The pt is a 78 yo male presenting 2/1 from home with concern for stroke (slurred speech and mild L facial droop). MRI showed:  small area of acute infarct in the more superior left frontal cortex and 2 areas of subacute ischemia in superior R frontal lobe. PMH includes: dementia, arthritis, HTN, HLD, RA, COPD, OSA, and CVA.    PT Comments  Pt resting in bed on arrival, eager for OOB mobility and demonstrating continued progress towards acute goals. Pt continues to be limited in safe mobility, by impaired balance/postural reactions, decreased insight into deficits, and general weakness. Pt completing bed mobility at supervision level, transfers with CGA for safety and gait with min A to steady via HHA. Pt following all simple single steps commands throughout session. Anticipate pt getting close to functional baseline per chart review. Will continue to follow acutely to  progress toward functional mobility goals. Current plan remains appropriate to address deficits and maximize functional independence and safety.    If plan is discharge home, recommend the following: Direct supervision/assist for medications management;Help with stairs or ramp for entrance;Assist for transportation;Assistance with feeding;Direct supervision/assist for financial management;Assistance with cooking/housework;Supervision due to cognitive status;A little help with walking and/or transfers;A little help with bathing/dressing/bathroom   Can travel by private vehicle     Yes  Equipment Recommendations  Other (comment) (defer to next venue)    Recommendations for Other Services       Precautions / Restrictions Precautions Precautions: Fall Precaution/Restrictions Comments: watch BP Restrictions Weight Bearing Restrictions Per Provider Order: No     Mobility  Bed  Mobility Overal bed mobility: Needs Assistance Bed Mobility: Supine to Sit, Sit to Supine     Supine to sit: Supervision, HOB elevated Sit to supine: Supervision   General bed mobility comments: cues provided, likes to enter bed quadruped to return to supine, able to do without fault    Transfers Overall transfer level: Needs assistance Equipment used: None Transfers: Sit to/from Stand Sit to Stand: Contact guard assist           General transfer comment: CGA for safety    Ambulation/Gait Ambulation/Gait assistance: Min assist Gait Distance (Feet): 200 Feet Assistive device: 1 person hand held assist Gait Pattern/deviations: Step-to pattern, Decreased stride length, Trunk flexed, Narrow base of support, Drifts right/left Gait velocity: variable     General Gait Details: HHA to steady, drifting R/L in hall with head turns and conversation   Optometrist     Tilt Bed    Modified Rankin (Stroke Patients Only) Modified Rankin (Stroke Patients Only) Pre-Morbid Rankin Score: Moderate disability Modified Rankin: Moderately severe disability     Balance Overall balance assessment: Needs assistance Sitting-balance support: No upper extremity supported Sitting balance-Leahy Scale: Fair     Standing balance support: No upper extremity supported, During functional activity Standing balance-Leahy Scale: Fair Standing balance comment: min A to maintain balance                            Communication Communication Communication: No apparent difficulties  Cognition Arousal: Alert Behavior During Therapy: Flat affect   PT - Cognitive impairments: History of cognitive impairments  PT - Cognition Comments: poor recall from previous session, able to recall stories from long term memory Following commands: Intact Following commands impaired: Only follows one step commands consistently, Follows  multi-step commands inconsistently    Cueing Cueing Techniques: Verbal cues, Gestural cues, Tactile cues, Visual cues  Exercises      General Comments General comments (skin integrity, edema, etc.): VSS      Pertinent Vitals/Pain Pain Assessment Pain Assessment: No/denies pain    Home Living                          Prior Function            PT Goals (current goals can now be found in the care plan section) Acute Rehab PT Goals Patient Stated Goal: none stated PT Goal Formulation: With patient Time For Goal Achievement: 07/05/23 Progress towards PT goals: Progressing toward goals    Frequency    Min 1X/week      PT Plan      Co-evaluation              AM-PAC PT "6 Clicks" Mobility   Outcome Measure  Help needed turning from your back to your side while in a flat bed without using bedrails?: A Lot Help needed moving from lying on your back to sitting on the side of a flat bed without using bedrails?: A Little Help needed moving to and from a bed to a chair (including a wheelchair)?: A Little Help needed standing up from a chair using your arms (e.g., wheelchair or bedside chair)?: A Little Help needed to walk in hospital room?: A Little Help needed climbing 3-5 steps with a railing? : Total 6 Click Score: 15    End of Session Equipment Utilized During Treatment: Gait belt Activity Tolerance: Patient tolerated treatment well Patient left: in bed;with call bell/phone within reach;with bed alarm set;Other (comment) (with fall mats replaced) Nurse Communication: Mobility status PT Visit Diagnosis: Unsteadiness on feet (R26.81);Muscle weakness (generalized) (M62.81);Other abnormalities of gait and mobility (R26.89);Difficulty in walking, not elsewhere classified (R26.2)     Time: 4098-1191 PT Time Calculation (min) (ACUTE ONLY): 23 min  Charges:    $Gait Training: 23-37 mins PT General Charges $$ ACUTE PT VISIT: 1 Visit                      Dennis [name] R. PTA Acute Rehabilitation Services Office: [phone]   Catalina Antigua 07/02/2023, 2:56 PM

## 2023-07-03 DIAGNOSIS — I639 Cerebral infarction, unspecified: Secondary | ICD-10-CM | POA: Diagnosis not present

## 2023-07-03 MED ORDER — HALOPERIDOL LACTATE 5 MG/ML IJ SOLN
5.0000 mg | Freq: Four times a day (QID) | INTRAMUSCULAR | Status: DC | PRN
Start: 2023-07-03 — End: 2023-07-07
  Administered 2023-07-03 – 2023-07-05 (×3): 5 mg via INTRAMUSCULAR
  Filled 2023-07-03 (×4): qty 1

## 2023-07-03 MED ORDER — CARVEDILOL 6.25 MG PO TABS
6.2500 mg | ORAL_TABLET | Freq: Two times a day (BID) | ORAL | Status: DC
  Administered 2023-07-03 – 2023-07-04 (×2): 6.25 mg via ORAL
  Filled 2023-07-03 (×3): qty 1

## 2023-07-03 NOTE — Plan of Care (Signed)
  Problem: Education: Goal: Knowledge of disease or condition will improve Outcome: Progressing Goal: Knowledge of secondary prevention will improve (MUST DOCUMENT ALL) Outcome: Progressing Goal: Knowledge of patient specific risk factors will improve (DELETE if not current risk factor) Outcome: Progressing   Problem: Ischemic Stroke/TIA Tissue Perfusion: Goal: Complications of ischemic stroke/TIA will be minimized Outcome: Progressing   Problem: Coping: Goal: Will verbalize positive feelings about self Outcome: Progressing Goal: Will identify appropriate support needs Outcome: Progressing   Problem: Health Behavior/Discharge Planning: Goal: Ability to manage health-related needs will improve Outcome: Progressing Goal: Goals will be collaboratively established with patient/family Outcome: Progressing   Problem: Self-Care: Goal: Ability to participate in self-care as condition permits will improve Outcome: Progressing Goal: Verbalization of feelings and concerns over difficulty with self-care will improve Outcome: Progressing Goal: Ability to communicate needs accurately will improve Outcome: Progressing   Problem: Nutrition: Goal: Risk of aspiration will decrease Outcome: Progressing Goal: Dietary intake will improve Outcome: Progressing   Problem: Health Behavior/Discharge Planning: Goal: Ability to manage health-related needs will improve Outcome: Progressing   Problem: Clinical Measurements: Goal: Ability to maintain clinical measurements within normal limits will improve Outcome: Progressing Goal: Will remain free from infection Outcome: Progressing Goal: Diagnostic test results will improve Outcome: Progressing Goal: Respiratory complications will improve Outcome: Progressing Goal: Cardiovascular complication will be avoided Outcome: Progressing   Problem: Activity: Goal: Risk for activity intolerance will decrease Outcome: Progressing   Problem:  Nutrition: Goal: Adequate nutrition will be maintained Outcome: Progressing   Problem: Coping: Goal: Level of anxiety will decrease Outcome: Progressing   Problem: Elimination: Goal: Will not experience complications related to bowel motility Outcome: Progressing Goal: Will not experience complications related to urinary retention Outcome: Progressing   Problem: Pain Managment: Goal: General experience of comfort will improve and/or be controlled Outcome: Progressing   Problem: Safety: Goal: Ability to remain free from injury will improve Outcome: Progressing   Problem: Skin Integrity: Goal: Risk for impaired skin integrity will decrease Outcome: Progressing   Problem: Safety: Goal: Non-violent Restraint(s) Outcome: Progressing

## 2023-07-03 NOTE — Progress Notes (Signed)
Progress Note   Patient: Dennis [name] VHQ:[id number] DOB: January 20, 1946 DOA: 06/21/2023     11 DOS: the patient was seen and examined on 07/03/2023   Brief hospital course: 77yo with h/o HLD, BPH, HTN, dementia, RA, COPD, OSA not on CPAP, and CVA who presented on 2/1 with AMS, dysarthria.  He was found to have an acute infarct in the L posterior subinsular white matter on MRI.  He and his significant other both have dementia, awaiting guardianship from social services.  Assessment and Plan:  Acute CVA Presented with AMS and dysarthria Aspirin and Plavix have been discontinued Continue Eliquis and Lipitor Appreciate ongoing PT/OT and speech involvement Awaiting SNF placement  HTN BPs are more consistently elevated  Will add carvedilol 6.25 mg BID per pharmacy recommendation   Dysphagia SLP consulting Recommended for dysphagia 1 diet with nectar-thick liquid Underwent MBS on 2/11 with significantly improved swallow function associated with improved cognition Diet advanced to dysphagia 2 with thin liquids   Dementia Patient lives with wife, who also has dementia DSS is pursuing guardianship for both Once guardianship is obtained, he will need placement DSS is also working on LTC Medicaid for him   HFrEF Euvolemic at present on GDMT Carvedilol which was recently added and then stopped so will resume at this time EF 25%, global hypokinesis   BPH Continue tamsulosin and finasteride         Consultants: Neurology Cardiology Palliative care PT OT SLP TOC tem   Procedures: Echocardiogram 2/1 DVT US 2/1 Modified barium swallow evaluation 2/12   Antibiotics: None   30 Day Unplanned Readmission Risk Score    Flowsheet Row ED to Hosp-Admission (Current) from 06/21/2023 in [facility]  30 Day Unplanned Readmission Risk Score (%) 25.89 Filed at 07/03/2023 0400       This score is the patient's risk of an unplanned readmission within 30 days of being  discharged (0 -100%). The score is based on dignosis, age, lab data, medications, orders, and past utilization.   Low:  0-14.9   Medium: 15-21.9   High: 22-29.9   Extreme: 30 and above           Subjective: Confused, trying to call his wife with the remote control.  No specific complaints otherwise.   Objective: Vitals:   07/03/23 0350 07/03/23 0810  BP: (!) 168/89 (!) 152/70  Pulse: 94 91  Resp: 18 16  Temp: (!) 97.5 F (36.4 C) 97.6 F (36.4 C)  SpO2: 97% 94%    Intake/Output Summary (Last 24 hours) at 07/03/2023 1435 Last data filed at 07/03/2023 0810 Gross per 24 hour  Intake 360 ml  Output 1900 ml  Net -1540 ml   Filed Weights   06/21/23 0000  Weight: 60 kg    Exam:  General:  Appears calm and comfortable and is in NAD Eyes:  EOMI, normal lids, iris ENT:  grossly normal hearing, lips & tongue, mmm Neck:  no LAD, masses or thyromegaly Cardiovascular:  RRR, no m/r/g. No LE edema.  Respiratory:   CTA bilaterally with no wheezes/rales/rhonchi.  Normal respiratory effort. Abdomen:  soft, NT, ND Skin:  no rash or induration seen on limited exam Musculoskeletal:  symmetric-appearing BUE/BLE, no bony abnormality Psychiatric:  pleasant mood and affect, speech mildly dysarthric and quiet but not unintelligible today, confused Neurologic:  Able to follow commands and strength appears symmetric today  Data Reviewed: I have reviewed the patient's lab results since admission.  Pertinent labs for today  include:   None today     Family Communication: None present  Disposition: Status is: Inpatient Remains inpatient appropriate because: awaiting placement     Time spent: 25 minutes  Unresulted Labs (From admission, onward)    None        Author: Jonah Blue, MD 07/03/2023 2:35 PM  For on call review [email or web address].

## 2023-07-03 NOTE — Plan of Care (Signed)
Soft waist belt started at 16:21  Problem: Safety: Goal: Non-violent Restraint(s) 07/03/2023 1731 by Ileana Roup, RN Outcome: Not Met (add Reason) 07/03/2023 1136 by Ileana Roup, RN Outcome: Not Met (add Reason)

## 2023-07-03 NOTE — Plan of Care (Addendum)
Conts on tele sitter, awaiting placement.  15:07 Patient is trying to go off unit ""to go see his wife". Security called, male RN is walking with the patient and trying to de-escalate the situation. MD made aware, will recommend IM anxiolytics.   1529 IM haldol administered.  Problem: Ischemic Stroke/TIA Tissue Perfusion: Goal: Complications of ischemic stroke/TIA will be minimized Outcome: Not Met (add Reason)   Problem: Nutrition: Goal: Risk of aspiration will decrease Outcome: Not Met (add Reason) Goal: Dietary intake will improve Outcome: Not Met (add Reason)   Problem: Health Behavior/Discharge Planning: Goal: Ability to manage health-related needs will improve Outcome: Not Met (add Reason)   Problem: Pain Managment: Goal: General experience of comfort will improve and/or be controlled Outcome: Not Met (add Reason)   Problem: Safety: Goal: Ability to remain free from injury will improve Outcome: Not Met (add Reason)   Problem: Skin Integrity: Goal: Risk for impaired skin integrity will decrease Outcome: Not Met (add Reason)

## 2023-07-04 DIAGNOSIS — I639 Cerebral infarction, unspecified: Secondary | ICD-10-CM | POA: Diagnosis not present

## 2023-07-04 MED ORDER — CARVEDILOL 3.125 MG PO TABS
3.1250 mg | ORAL_TABLET | Freq: Two times a day (BID) | ORAL | Status: DC
  Administered 2023-07-04 – 2023-08-14 (×80): 3.125 mg via ORAL
  Filled 2023-07-04 (×80): qty 1

## 2023-07-04 MED ORDER — QUETIAPINE 12.5 MG HALF TABLET
25.0000 mg | ORAL_TABLET | ORAL | Status: DC
  Administered 2023-07-05 – 2023-08-14 (×41): 25 mg via ORAL
  Filled 2023-07-04: qty 2
  Filled 2023-07-04: qty 1
  Filled 2023-07-04 (×39): qty 2
  Filled 2023-07-04: qty 1

## 2023-07-04 MED ORDER — ORAL CARE MOUTH RINSE: 15.0000 mL | OROMUCOSAL | Status: DC | PRN

## 2023-07-04 NOTE — TOC Progression Note (Signed)
Transition of Care [facility]) - Progression Note    Patient Details  Name: Dennis [name] MRN: [id number] Date of Birth: 07-22-1945  Transition of [facility] CM/SW Contact  Baldemar Lenis, [place] Phone Number: 07/04/2023, 10:27 AM  Clinical Narrative:   CSW following for discharge to SNF. Patient still awaiting guardianship and now back in restraints. CSW to follow.    Expected Discharge Plan: Skilled Nursing Facility Barriers to Discharge: Insurance Authorization, SNF Pending bed offer, Continued Medical Work up, Unsafe home situation  Expected Discharge Plan and Services     Post Acute Care Choice: Skilled Nursing Facility Living arrangements for the past 2 months: Single Family Home                                       Social Determinants of Health (SDOH) Interventions SDOH Screenings   Food Insecurity: No Food Insecurity (06/21/2023)  Housing: Low Risk  (06/21/2023)  Transportation Needs: No Transportation Needs (06/21/2023)  Utilities: Not At Risk (06/21/2023)  Social Connections: Moderately Isolated (06/21/2023)  Tobacco Use: Medium Risk (06/21/2023)    Readmission Risk Interventions     No data to display

## 2023-07-04 NOTE — Progress Notes (Signed)
Physical Therapy Treatment Patient Details Name: Dennis [name] MRN: [id number] DOB: [birth year] Today's Date: 07/04/2023   History of Present Illness The pt is a 78 yo male presenting 2/1 from home with concern for stroke (slurred speech and mild L facial droop). MRI showed:  small area of acute infarct in the more superior left frontal cortex and 2 areas of subacute ischemia in superior R frontal lobe. PMH includes: dementia, arthritis, HTN, HLD, RA, COPD, OSA, and CVA.    PT Comments  Pt seen for PT tx with pt received in bed, agreeable to tx. Pt pleasant throughout session, following simple commands with extra time. Pt reports need to use restroom, continent BM on toilet. Pt requires cuing to locate soap on R & thoroughly was hands while standing at sink as pt lathers hands in soap but forgets to rinse them. Pt ambulates in hallway with RUE HHA & CGA. Pt negotiates steps in gym with B rails & CGA with pt self selecting step-over-step gait pattern. Pt would benefit from continued PT treatment to address balance & progress gait with LRAD.   If plan is discharge home, recommend the following: Direct supervision/assist for medications management;Help with stairs or ramp for entrance;Assist for transportation;Assistance with feeding;Direct supervision/assist for financial management;Assistance with cooking/housework;Supervision due to cognitive status;A little help with walking and/or transfers;A little help with bathing/dressing/bathroom   Can travel by private vehicle     Yes  Equipment Recommendations  Other (comment) (defer to next venue)    Recommendations for Other Services       Precautions / Restrictions Precautions Precautions: Fall Precaution/Restrictions Comments: watch BP Restrictions Weight Bearing Restrictions Per Provider Order: No     Mobility  Bed Mobility Overal bed mobility: Needs Assistance Bed Mobility: Supine to Sit, Sit to Supine     Supine to sit: Supervision,  HOB elevated Sit to supine: Supervision, HOB elevated   General bed mobility comments: extra time    Transfers Overall transfer level: Needs assistance Equipment used: None Transfers: Sit to/from Stand Sit to Stand: Contact guard assist           General transfer comment: STS from EOB, low toilet in bathoom    Ambulation/Gait Ambulation/Gait assistance: Min assist, Contact guard assist Gait Distance (Feet):  (>150 ft) Assistive device: 1 person hand held assist Gait Pattern/deviations: Step-through pattern, Decreased step length - right, Decreased weight shift to left Gait velocity: decreased     General Gait Details: RUE HHA min assist fade to CGA   Stairs Stairs: Yes Stairs assistance: Contact guard assist Stair Management: Two rails, Alternating pattern Number of Stairs: 10 (6" + 3")     Wheelchair Mobility     Tilt Bed    Modified Rankin (Stroke Patients Only)       Balance Overall balance assessment: Needs assistance Sitting-balance support: No upper extremity supported Sitting balance-Leahy Scale: Fair Sitting balance - Comments: sitting on toilet, no LOB   Standing balance support: Single extremity supported, During functional activity, No upper extremity supported Standing balance-Leahy Scale: Fair Standing balance comment: hand hygiene standing at sink with supervision<>CGA                            Communication Communication Factors Affecting Communication: Reduced clarity of speech  Cognition Arousal: Alert Behavior During Therapy: Flat affect   PT - Cognitive impairments: History of cognitive impairments  PT - Cognition Comments: Pt with impaired focused attention in hallway, distracted by enviroment.   Following commands impaired: Only follows one step commands consistently, Follows one step commands with increased time    Cueing    Exercises      General Comments General comments  (skin integrity, edema, etc.): Pt with continent BM, PT providing assistance for hygiene for thoroughness      Pertinent Vitals/Pain Pain Assessment Pain Assessment: Faces Faces Pain Scale: Hurts little more Pain Location: R distal medial thigh, knee Pain Descriptors / Indicators: Discomfort, Sore Pain Intervention(s): Monitored during session, Limited activity within patient's tolerance (made nurse aware)    Home Living                          Prior Function            PT Goals (current goals can now be found in the care plan section) Acute Rehab PT Goals Patient Stated Goal: none stated PT Goal Formulation: With patient Time For Goal Achievement: 07/18/23 Potential to Achieve Goals: Good Progress towards PT goals: Progressing toward goals;Goals updated    Frequency    Min 1X/week      PT Plan      Co-evaluation              AM-PAC PT "6 Clicks" Mobility   Outcome Measure  Help needed turning from your back to your side while in a flat bed without using bedrails?: None Help needed moving from lying on your back to sitting on the side of a flat bed without using bedrails?: A Little Help needed moving to and from a bed to a chair (including a wheelchair)?: A Little Help needed standing up from a chair using your arms (e.g., wheelchair or bedside chair)?: A Little Help needed to walk in hospital room?: A Little Help needed climbing 3-5 steps with a railing? : A Little 6 Click Score: 19    End of Session   Activity Tolerance: Patient tolerated treatment well Patient left: in bed;with call bell/phone within reach (BUE wrist restraints donned (abdominal restraint belt doffed per nurse recommendation)) Nurse Communication:  (pain patch coming off back, R LE pain, need for new male purewick) PT Visit Diagnosis: Unsteadiness on feet (R26.81);Muscle weakness (generalized) (M62.81);Other abnormalities of gait and mobility (R26.89);Difficulty in walking,  not elsewhere classified (R26.2);Pain Pain - Right/Left: Right Pain - part of body: Knee     Time: 4098-1191 PT Time Calculation (min) (ACUTE ONLY): 28 min  Charges:    $Therapeutic Activity: 23-37 mins PT General Charges $$ ACUTE PT VISIT: 1 Visit                     Aleda Grana, PT, DPT 07/04/23, 11:00 AM   Sandi Mariscal 07/04/2023, 10:59 AM

## 2023-07-04 NOTE — Progress Notes (Signed)
Progress Note   Patient: Dennis [name] ZOX:[id number] DOB: Feb 24, 1946 DOA: 06/21/2023     12 DOS: the patient was seen and examined on 07/04/2023   Brief hospital course: 78yo with h/o HLD, BPH, HTN, dementia, RA, COPD, OSA not on CPAP, and CVA who presented on 2/1 with AMS, dysarthria.  He was found to have an acute infarct in the L posterior subinsular white matter on MRI.  He and his significant other both have dementia, awaiting guardianship from social services.  Assessment and Plan:  Acute CVA Presented with AMS and dysarthria Aspirin and Plavix have been discontinued Continue Eliquis and Lipitor Appreciate ongoing PT/OT and speech involvement Awaiting SNF placement   HTN BPs have been more consistently elevated  Added carvedilol 6.25 mg BID per pharmacy recommendation but will decrease to 3.125 mg BID today Stop hydralazine and titrate GDMT as possible   Dysphagia SLP consulting Recommended for dysphagia 1 diet with nectar-thick liquid Underwent MBS on 2/11 with significantly improved swallow function associated with improved cognition Diet advanced to dysphagia 2 with thin liquids   Dementia Patient lives with wife, who also has dementia DSS is pursuing guardianship for both Once guardianship is obtained, he will need placement DSS is also working on LTC Medicaid for him Avoid restraints if possible (yesterday they were needed, as he was trying to leave the unit to find his wife) Will increase Seroquel to 25 mg qAM and 50 mg at bedtime Can d/c prn Haldol tomorrow if no longer needed   HFrEF Euvolemic at present on GDMT Carvedilol which was recently added and then stopped and is resumed again EF 25%, global hypokinesis Continue Imdur   BPH Continue tamsulosin and finasteride         Consultants: Neurology Cardiology Palliative care PT OT SLP TOC tem   Procedures: Echocardiogram 2/1 DVT US 2/1 Modified barium swallow evaluation 2/12    Antibiotics: None    30 Day Unplanned Readmission Risk Score    Flowsheet Row ED to Hosp-Admission (Current) from 06/21/2023 in [facility]  30 Day Unplanned Readmission Risk Score (%) 24.93 Filed at 07/04/2023 0801       This score is the patient's risk of an unplanned readmission within 30 days of being discharged (0 -100%). The score is based on dignosis, age, lab data, medications, orders, and past utilization.   Low:  0-14.9   Medium: 15-21.9   High: 22-29.9   Extreme: 30 and above           Subjective: Pleasant and calm today, wanted to know how to get out of his restraints (but did not seem to understand what they are)   Objective: Vitals:   07/04/23 1539 07/04/23 1652  BP: (!) 104/49 100/66  Pulse:  75  Resp:  16  Temp:    SpO2:  96%    Intake/Output Summary (Last 24 hours) at 07/04/2023 1700 Last data filed at 07/04/2023 8119 Gross per 24 hour  Intake 780 ml  Output 1075 ml  Net -295 ml   Filed Weights   06/21/23 0000  Weight: 60 kg    Exam:  General:  Appears calm and comfortable and is in NAD Eyes:  EOMI, normal lids, iris ENT:  grossly normal hearing, lips & tongue, mmm Neck:  no LAD, masses or thyromegaly Cardiovascular:  RRR, no m/r/g. No LE edema.  Respiratory:   CTA bilaterally with no wheezes/rales/rhonchi.  Normal respiratory effort. Abdomen:  soft, NT, ND Skin:  no  rash or induration seen on limited exam Musculoskeletal:  symmetric-appearing BUE/BLE, no bony abnormality, wrist restraints in place Psychiatric:  pleasant mood and affect, speech mildly dysarthric and quiet but not unintelligible today, confused Neurologic:  Able to follow commands and strength appears symmetric today  Data Reviewed: I have reviewed the patient's lab results since admission.  Pertinent labs for today include:   None today     Family Communication: None present  Disposition: Status is: Inpatient Remains inpatient appropriate  because: awaiting placement     Time spent: 25 minutes  Unresulted Labs (From admission, onward)     Start     Ordered   Unscheduled  CBC with Differential/Platelet  Tomorrow morning,   R       Question:  Specimen collection method  Answer:  Lab=Lab collect   07/04/23 1700   Unscheduled  Basic metabolic panel  Tomorrow morning,   R       Question:  Specimen collection method  Answer:  Lab=Lab collect   07/04/23 1700             Author: Jonah Blue, MD 07/04/2023 5:00 PM  For on call review [email or web address].

## 2023-07-05 DIAGNOSIS — I639 Cerebral infarction, unspecified: Secondary | ICD-10-CM | POA: Diagnosis not present

## 2023-07-05 LAB — CBC WITH DIFFERENTIAL/PLATELET
Abs Immature Granulocytes: 0.06 10*3/uL (ref 0.00–0.07)
Basophils Absolute: 0 10*3/uL (ref 0.0–0.1)
Basophils Relative: 0 %
Eosinophils Absolute: 0.3 10*3/uL (ref 0.0–0.5)
Eosinophils Relative: 3 %
HCT: 37.7 % — ABNORMAL LOW (ref 39.0–52.0)
Hemoglobin: 12.1 g/dL — ABNORMAL LOW (ref 13.0–17.0)
Immature Granulocytes: 1 %
Lymphocytes Relative: 25 %
Lymphs Abs: 2.3 10*3/uL (ref 0.7–4.0)
MCH: 27.9 pg (ref 26.0–34.0)
MCHC: 32.1 g/dL (ref 30.0–36.0)
MCV: 87.1 fL (ref 80.0–100.0)
Monocytes Absolute: 0.8 10*3/uL (ref 0.1–1.0)
Monocytes Relative: 9 %
Neutro Abs: 5.6 10*3/uL (ref 1.7–7.7)
Neutrophils Relative %: 62 %
Platelets: 346 10*3/uL (ref 150–400)
RBC: 4.33 MIL/uL (ref 4.22–5.81)
RDW: 15.6 % — ABNORMAL HIGH (ref 11.5–15.5)
WBC: 9.1 10*3/uL (ref 4.0–10.5)
nRBC: 0 % (ref 0.0–0.2)

## 2023-07-05 LAB — BASIC METABOLIC PANEL
Anion gap: 9 (ref 5–15)
BUN: 27 mg/dL — ABNORMAL HIGH (ref 8–23)
CO2: 28 mmol/L (ref 22–32)
Calcium: 9.5 mg/dL (ref 8.9–10.3)
Chloride: 101 mmol/L (ref 98–111)
Creatinine, Ser: 1.63 mg/dL — ABNORMAL HIGH (ref 0.61–1.24)
GFR, Estimated: 43 mL/min — ABNORMAL LOW (ref >60)
Glucose, Bld: 119 mg/dL — ABNORMAL HIGH (ref 70–99)
Potassium: 4 mmol/L (ref 3.5–5.1)
Sodium: 138 mmol/L (ref 135–145)

## 2023-07-05 NOTE — Progress Notes (Signed)
 Progress Note   Patient: Dennis [name] [id number] DOB: Nov 26, 1945 DOA: 06/21/2023     13 DOS: the patient was seen and examined on 07/05/2023   Brief hospital course: 78yo with h/o HLD, BPH, HTN, dementia, RA, COPD, OSA not on CPAP, and CVA who presented on 2/1 with AMS, dysarthria.  He was found to have an acute infarct in the L posterior subinsular white matter on MRI.  He and his significant other both have dementia, awaiting guardianship from social services.  Assessment and Plan:  Awaiting Placement Patient has completed treatment for acute CVA He lived independently with his wife PTA and is not currently safe to return home (his wife also has dementia so DSS is seeking guardianship for them both) Once guardianship is obtained, he will need placement DSS is also working on LTC Medicaid for him  Acute CVA Presented with AMS and dysarthria Aspirin and Plavix have been discontinued Continue Eliquis and Lipitor Appreciate ongoing PT/OT and speech involvement Awaiting SNF placement   HTN BPs have been more consistently elevated  Added carvedilol 6.25 mg BID per pharmacy recommendation but will decrease to 3.125 mg BID today Stop hydralazine and titrate GDMT as possible   Dysphagia SLP consulting Recommended for dysphagia 1 diet with nectar-thick liquid Underwent MBS on 2/11 with significantly improved swallow function associated with improved cognition Diet advanced to dysphagia 2 with thin liquids   Dementia Avoid restraints if possible (yesterday they were needed, as he was trying to leave the unit to find his wife) Increased Seroquel to 25 mg qAM and 50 mg at bedtime Can d/c prn Haldol tomorrow if no longer needed   HFrEF Euvolemic at present on GDMT Carvedilol which was recently added and then stopped and is resumed again EF 25%, global hypokinesis Continue Imdur   BPH Continue tamsulosin and finasteride  Stage 3a CKD Appears to be stable at this time Attempt  to avoid nephrotoxic medications Recheck BMP periodically        Consultants: Neurology Cardiology Palliative care PT OT SLP TOC tem   Procedures: Echocardiogram 2/1 DVT US 2/1 Modified barium swallow evaluation 2/12   Antibiotics: None      30 Day Unplanned Readmission Risk Score    Flowsheet Row ED to Hosp-Admission (Current) from 06/21/2023 in [facility]  30 Day Unplanned Readmission Risk Score (%) 24.97 Filed at 07/05/2023 0400       This score is the patient's risk of an unplanned readmission within 30 days of being discharged (0 -100%). The score is based on dignosis, age, lab data, medications, orders, and past utilization.   Low:  0-14.9   Medium: 15-21.9   High: 22-29.9   Extreme: 30 and above           Subjective: Agitated and resistant to vitals overnight.  Sedate and reasonable this AM.   Objective: Vitals:   07/05/23 0950 07/05/23 1044  BP: 120/68 (!) 116/55  Pulse:  85  Resp:  17  Temp:  97.6 F (36.4 C)  SpO2:  95%    Intake/Output Summary (Last 24 hours) at 07/05/2023 1529 Last data filed at 07/05/2023 0310 Gross per 24 hour  Intake 118 ml  Output 250 ml  Net -132 ml   Filed Weights   06/21/23 0000  Weight: 60 kg    Exam:  General:  Appears calm and comfortable and is in NAD Eyes:  EOMI, normal lids, iris ENT:  grossly normal hearing, lips & tongue, mmm Neck:  no LAD, masses or thyromegaly Cardiovascular:  RRR, no m/r/g. No LE edema.  Respiratory:   CTA bilaterally with no wheezes/rales/rhonchi.  Normal respiratory effort. Abdomen:  soft, NT, ND Skin:  no rash or induration seen on limited exam Musculoskeletal:  symmetric-appearing BUE/BLE, no bony abnormality, wrist restraints in place Psychiatric:  pleasant mood and affect, speech mildly dysarthric and quiet but not unintelligible today, confused Neurologic:  Able to follow commands and strength appears symmetric today  Data Reviewed: I have reviewed  the patient's lab results since admission.  Pertinent labs for today include:   Glucose 119 BUN 27/Creatinine 1.63/GFR 43 - stable Stable CBC     Family Communication: None present; wife also has dementia  Disposition: Status is: Inpatient Remains inpatient appropriate because: unsafe disposition     Time spent: 25 minutes  Unresulted Labs (From admission, onward)    None        Author: Jonah Blue, MD 07/05/2023 3:29 PM  For on call review [email or web address].

## 2023-07-05 NOTE — Progress Notes (Addendum)
 Patient is combative when awaken to check vital signs, starts grabbing staff when attempting to check temperature. Unable to complete full vital signs. Refused to check bladder as well, started kicking staff.  4098- DR. Toniann Fail updated of patient's aggressiveness towards staff and refusal for bladder scan and temperature and saturation taking.  0500- per other NT Daniah, she has drained Canister for urine, output recorded.

## 2023-07-05 NOTE — Plan of Care (Signed)
  Problem: Coping: Goal: Will identify appropriate support needs Outcome: Progressing   Problem: Health Behavior/Discharge Planning: Goal: Goals will be collaboratively established with patient/family Outcome: Progressing   Problem: Self-Care: Goal: Ability to communicate needs accurately will improve Outcome: Progressing   Problem: Nutrition: Goal: Risk of aspiration will decrease Outcome: Progressing   Problem: Clinical Measurements: Goal: Respiratory complications will improve Outcome: Progressing Goal: Cardiovascular complication will be avoided Outcome: Progressing   Problem: Activity: Goal: Risk for activity intolerance will decrease Outcome: Progressing   Problem: Nutrition: Goal: Adequate nutrition will be maintained Outcome: Progressing

## 2023-07-05 NOTE — Plan of Care (Signed)
  Problem: Ischemic Stroke/TIA Tissue Perfusion: Goal: Complications of ischemic stroke/TIA will be minimized Outcome: Not Progressing   Problem: Coping: Goal: Will verbalize positive feelings about self Outcome: Not Progressing Goal: Will identify appropriate support needs Outcome: Not Progressing   Problem: Nutrition: Goal: Risk of aspiration will decrease Outcome: Not Progressing Goal: Dietary intake will improve Outcome: Not Progressing   Problem: Clinical Measurements: Goal: Will remain free from infection Outcome: Not Progressing   Problem: Coping: Goal: Level of anxiety will decrease Outcome: Not Progressing   Problem: Safety: Goal: Ability to remain free from injury will improve Outcome: Not Progressing   Problem: Safety: Goal: Non-violent Restraint(s) Outcome: Not Progressing

## 2023-07-06 DIAGNOSIS — I639 Cerebral infarction, unspecified: Secondary | ICD-10-CM | POA: Diagnosis not present

## 2023-07-06 NOTE — Progress Notes (Signed)
 Progress Note   Patient: Dennis [name] [id number] DOB: May 02, 1946 DOA: 06/21/2023     14 DOS: the patient was seen and examined on 07/06/2023   Brief hospital course: 78yo with h/o HLD, BPH, HTN, dementia, RA, COPD, OSA not on CPAP, and CVA who presented on 2/1 with AMS, dysarthria.  He was found to have an acute infarct in the L posterior subinsular white matter on MRI.  He and his significant other both have dementia, awaiting guardianship from social services.  Assessment and Plan:  Awaiting Placement 78yo Patient has completed treatment for acute CVA He lived independently with his wife PTA and is not currently safe to return home (his wife also has dementia so DSS is seeking guardianship for them both) Once guardianship is obtained, he will need placement DSS is also working on LTC Medicaid for him   Acute CVA Presented with AMS and dysarthria Aspirin and Plavix have been discontinued Continue Eliquis and Lipitor Appreciate ongoing PT/OT and speech involvement Awaiting SNF placement   HTN BPs have been more consistently elevated  Added carvedilol 6.25 mg BID per pharmacy recommendation but will decrease to 3.125 mg BID today Stop hydralazine and titrate GDMT as possible   Dysphagia SLP consulting Recommended for dysphagia 1 diet with nectar-thick liquid Underwent MBS on 2/11 with significantly improved swallow function associated with improved cognition Diet advanced to dysphagia 2 with thin liquids   Dementia Avoid restraints if possible (yesterday they were needed, as he was trying to leave the unit to find his wife) Increased Seroquel to 25 mg qAM and 50 mg at bedtime Can d/c prn Haldol tomorrow if no longer needed   HFrEF Euvolemic at present on GDMT Carvedilol which was recently added and then stopped and is resumed again EF 25%, global hypokinesis Continue Imdur   BPH Continue tamsulosin and finasteride   Stage 3a CKD Appears to be stable at this time Attempt  to avoid nephrotoxic medications Recheck BMP periodically        Consultants: Neurology Cardiology Palliative care PT OT SLP TOC tem   Procedures: Echocardiogram 2/1 DVT US 2/1 Modified barium swallow evaluation 2/12   Antibiotics: None  30 Day Unplanned Readmission Risk Score    Flowsheet Row ED to Hosp-Admission (Current) from 06/21/2023 in [facility]  30 Day Unplanned Readmission Risk Score (%) 32.02 Filed at 07/06/2023 0401       This score is the patient's risk of an unplanned readmission within 30 days of being discharged (0 -100%). The score is based on dignosis, age, lab data, medications, orders, and past utilization.   Low:  0-14.9   Medium: 15-21.9   High: 22-29.9   Extreme: 30 and above           Subjective: Pleasant and comfortable but sad that he can't "get to" his wife.  He reports "she's going to divorce me!'   Objective: Vitals:   07/06/23 0056 07/06/23 0351  BP: (!) 123/59 110/66  Pulse: 100 63  Resp: 17 18  Temp: 98.2 F (36.8 C) 98.1 F (36.7 C)  SpO2: 96% 94%    Intake/Output Summary (Last 24 hours) at 07/06/2023 0749 Last data filed at 07/06/2023 0354 Gross per 24 hour  Intake 650 ml  Output 775 ml  Net -125 ml   Filed Weights   06/21/23 0000  Weight: 60 kg    Exam:  General:  Appears calm and comfortable and is in NAD Eyes:  EOMI, normal lids, iris ENT:  grossly normal hearing, lips & tongue, mmm Neck:  no LAD, masses or thyromegaly Cardiovascular:  RRR, no m/r/g. No LE edema.  Respiratory:   CTA bilaterally with no wheezes/rales/rhonchi.  Normal respiratory effort. Abdomen:  soft, NT, ND Skin:  no rash or induration seen on limited exam Musculoskeletal:  symmetric-appearing BUE/BLE, no bony abnormality, wrist restraints in place Psychiatric:  pleasant mood and affect, speech mildly dysarthric but generally appropriate, still does not understand his circumstances Neurologic:  Able to follow commands  and strength appears symmetric today  Data Reviewed: I have reviewed the patient's lab results since admission.  Pertinent labs for today include:   None today     Family Communication: None (wife also has dementia, DSS is seeking guardianship for both)  Disposition: Status is: Inpatient Remains inpatient appropriate because: awaiting placement     Time spent: 25 minutes  Unresulted Labs (From admission, onward)    None        Author: Jonah Blue, MD 07/06/2023 7:49 AM  For on call review [email or web address].

## 2023-07-06 NOTE — Progress Notes (Signed)
 Patient received new bed assignment.  Transferred over to 6N room 6N 32 by bed with RN and NT escort.  Handoff completed with Wilburn Mylar. RN who will take over care of patient for remainder of shift.     07/06/23 1600  Hand-off documentation  Hand-off Given Given to Transfer Unit/facility (Transfer from 3W to 6N)  Report given to (Full Name) Wilburn Mylar RN

## 2023-07-06 NOTE — Plan of Care (Signed)
 Calm, cooperative, no other complaints noted and comfortably sleeping on bed as of this time.  Problem: Education: Goal: Knowledge of disease or condition will improve Outcome: Progressing   Problem: Ischemic Stroke/TIA Tissue Perfusion: Goal: Complications of ischemic stroke/TIA will be minimized Outcome: Progressing   Problem: Self-Care: Goal: Ability to communicate needs accurately will improve Outcome: Progressing   Problem: Self-Care: Goal: Verbalization of feelings and concerns over difficulty with self-care will improve Outcome: Progressing   Problem: Clinical Measurements: Goal: Will remain free from infection Outcome: Progressing   Problem: Coping: Goal: Level of anxiety will decrease Outcome: Progressing   Problem: Nutrition: Goal: Adequate nutrition will be maintained Outcome: Progressing

## 2023-07-06 NOTE — Plan of Care (Signed)
 Alert, oriented to only self.  Set-up for breakfast this morning.  Dr. Ophelia Charter made staff aware that patient no longer requiring Stroke/CVA care.  Transfer to Med-Surg unit as bed becomes available to await safe discharge plan.   Problem: Health Behavior/Discharge Planning: Goal: Ability to manage health-related needs will improve Outcome: Progressing   Problem: Clinical Measurements: Goal: Ability to maintain clinical measurements within normal limits will improve Outcome: Progressing Goal: Will remain free from infection Outcome: Progressing Goal: Diagnostic test results will improve Outcome: Progressing Goal: Respiratory complications will improve Outcome: Progressing Goal: Cardiovascular complication will be avoided Outcome: Progressing   Problem: Activity: Goal: Risk for activity intolerance will decrease Outcome: Progressing   Problem: Nutrition: Goal: Adequate nutrition will be maintained Outcome: Progressing   Problem: Coping: Goal: Level of anxiety will decrease Outcome: Progressing   Problem: Elimination: Goal: Will not experience complications related to bowel motility Outcome: Progressing Goal: Will not experience complications related to urinary retention Outcome: Progressing   Problem: Pain Managment: Goal: General experience of comfort will improve and/or be controlled Outcome: Progressing   Problem: Safety: Goal: Ability to remain free from injury will improve Outcome: Progressing   Problem: Skin Integrity: Goal: Risk for impaired skin integrity will decrease Outcome: Progressing

## 2023-07-07 DIAGNOSIS — I639 Cerebral infarction, unspecified: Secondary | ICD-10-CM | POA: Diagnosis not present

## 2023-07-07 NOTE — Progress Notes (Signed)
 Occupational Therapy Treatment Patient Details Name: Dennis [name] MRN: [id number] DOB: [birth year] Today's Date: 07/07/2023   History of present illness The pt is a 78 yo male presenting 2/1 from home with concern for stroke (slurred speech and mild L facial droop). MRI showed:  small area of acute infarct in the more superior left frontal cortex and 2 areas of subacute ischemia in superior R frontal lobe. PMH includes: dementia, arthritis, HTN, HLD, RA, COPD, OSA, and CVA.   OT comments  Pt is making solid progress towards their acute OT goals, biweekly goals updated. Pt remains pleasantly confused and follows all simple 1 step commands, anticipate his cognition is near his functional baseline. CGA provided for all transfers and OOB mobility for safety only, no AD utilized. He stood at the sink for grooming with CGA and cues, again anticipate he is near his functional baseline. OT to continue to follow acutely to facilitate progress towards established goals. Pt will continue to benefit from skilled inpatient follow up therapy, <3 hours/day.       If plan is discharge home, recommend the following:  A lot of help with walking and/or transfers;A lot of help with bathing/dressing/bathroom;Assistance with cooking/housework;Direct supervision/assist for medications management;Direct supervision/assist for financial management;Help with stairs or ramp for entrance;Assist for transportation;Assistance with feeding;Supervision due to cognitive status   Equipment Recommendations  Other (comment)       Precautions / Restrictions Precautions Precautions: Fall Restrictions Weight Bearing Restrictions Per Provider Order: No       Mobility Bed Mobility Overal bed mobility: Needs Assistance Bed Mobility: Supine to Sit, Sit to Supine     Supine to sit: Supervision Sit to supine: Supervision   General bed mobility comments: cues for initiation    Transfers Overall transfer level: Needs  assistance Equipment used: None Transfers: Sit to/from Stand Sit to Stand: Contact guard assist                 Balance Overall balance assessment: Needs assistance Sitting-balance support: No upper extremity supported Sitting balance-Leahy Scale: Fair     Standing balance support: Single extremity supported, During functional activity, No upper extremity supported Standing balance-Leahy Scale: Fair                             ADL either performed or assessed with clinical judgement   ADL Overall ADL's : Needs assistance/impaired     Grooming: Contact guard assist Grooming Details (indicate cue type and reason): cues for sequencing and initiation                 Toilet Transfer: Contact guard assist;Ambulation Toilet Transfer Details (indicate cue type and reason): no AD, cues for safety         Functional mobility during ADLs: Contact guard assist General ADL Comments: limited by baseline cog    Extremity/Trunk Assessment Upper Extremity Assessment Upper Extremity Assessment: Overall WFL for tasks assessed   Lower Extremity Assessment Lower Extremity Assessment: Defer to PT evaluation        Vision   Vision Assessment?: No apparent visual deficits   Perception Perception Perception: Not tested   Praxis Praxis Praxis: Not tested   Communication Communication Communication: No apparent difficulties   Cognition Arousal: Alert Behavior During Therapy: WFL for tasks assessed/performed Cognition: History of cognitive impairments             OT - Cognition Comments: pleasantly confused, followed most simple 1 step  commands                 Following commands: Intact Following commands impaired: Only follows one step commands consistently, Follows one step commands with increased time               General Comments VSS.    Pertinent Vitals/ Pain       Pain Assessment Pain Assessment: No/denies pain Faces Pain Scale:  No hurt   Frequency  Min 1X/week        Progress Toward Goals  OT Goals(current goals can now be found in the care plan section)  Progress towards OT goals: Progressing toward goals  Acute Rehab OT Goals Patient Stated Goal: to eat OT Goal Formulation: With patient Time For Goal Achievement: 07/21/23 Potential to Achieve Goals: Good ADL Goals Pt Will Transfer to Toilet: ambulating;regular height toilet;with supervision   AM-PAC OT "6 Clicks" Daily Activity     Outcome Measure   Help from another person eating meals?: A Little Help from another person taking care of personal grooming?: A Little Help from another person toileting, which includes using toliet, bedpan, or urinal?: A Little Help from another person bathing (including washing, rinsing, drying)?: A Little Help from another person to put on and taking off regular upper body clothing?: A Little Help from another person to put on and taking off regular lower body clothing?: A Little 6 Click Score: 18    End of Session Equipment Utilized During Treatment: Gait belt  OT Visit Diagnosis: Other abnormalities of gait and mobility (R26.89);Muscle weakness (generalized) (M62.81);Other symptoms and signs involving cognitive function   Activity Tolerance Patient tolerated treatment well   Patient Left in bed;with bed alarm set;with call bell/phone within reach   Nurse Communication Mobility status        Time: 1456-1511 OT Time Calculation (min): 15 min  Charges: OT General Charges $OT Visit: 1 Visit OT Treatments $Self Care/Home Management : 8-22 mins  Derenda Mis, OTR/L Acute Rehabilitation Services Office [phone] Secure Chat Communication Preferred   [name] 07/07/2023, 3:19 PM

## 2023-07-07 NOTE — Progress Notes (Signed)
 Progress Note   Patient: Dennis [name] WUJ:[id number] DOB: 1946/01/05 DOA: 06/21/2023     15 DOS: the patient was seen and examined on 07/07/2023   Brief hospital course: 77yo with h/o HLD, BPH, HTN, dementia, RA, COPD, OSA not on CPAP, and CVA who presented on 2/1 with AMS, dysarthria.  He was found to have an acute infarct in the L posterior subinsular white matter on MRI.  He and his significant other both have dementia, awaiting guardianship from social services.  Assessment and Plan:  Awaiting Placement Patient has completed treatment for acute CVA, medically stable since 2/3 He lived independently with his wife PTA and is not currently safe to return home (his wife also has dementia so DSS is seeking guardianship for them both) Once guardianship is obtained, he will need placement DSS is also working on LTC Medicaid for him   Acute CVA Presented with AMS and dysarthria Aspirin and Plavix have been discontinued Continue Eliquis and Lipitor Appreciate ongoing PT/OT and speech involvement Awaiting SNF placement   HTN BPs have been more consistently elevated  Added carvedilol 3.125 mg BID with good result Stop hydralazine and titrate GDMT as possible   Dysphagia SLP consulting Recommended for dysphagia 1 diet with nectar-thick liquid Underwent MBS on 2/11 with significantly improved swallow function associated with improved cognition Diet advanced to dysphagia 2 with thin liquids   Dementia Avoid restraints if possible (yesterday they were needed, as he was trying to leave the unit to find his wife) Increased Seroquel to 25 mg qAM and 50 mg at bedtime prn Haldol discontinued on 2/17   HFrEF Euvolemic at present on GDMT Carvedilol which was recently added and then stopped and is resumed again EF 25%, global hypokinesis Continue Imdur   BPH Continue tamsulosin and finasteride   Stage 3a CKD Appears to be stable at this time Attempt to avoid nephrotoxic  medications Recheck BMP periodically        Consultants: Neurology Cardiology Palliative care PT OT SLP TOC tem   Procedures: Echocardiogram 2/1 DVT US 2/1 Modified barium swallow evaluation 2/12   Antibiotics: None  30 Day Unplanned Readmission Risk Score    Flowsheet Row ED to Hosp-Admission (Current) from 06/21/2023 in [facility]  SURGICAL  30 Day Unplanned Readmission Risk Score (%) 32.4 Filed at 07/07/2023 0401       This score is the patient's risk of an unplanned readmission within 30 days of being discharged (0 -100%). The score is based on dignosis, age, lab data, medications, orders, and past utilization.   Low:  0-14.9   Medium: 15-21.9   High: 22-29.9   Extreme: 30 and above           Subjective: Confused, wants to know where his wife is.  He is unable to operate the room telephone to call her.   Objective: Vitals:   07/07/23 0200 07/07/23 0528  BP: (!) 142/82 125/63  Pulse: 76 64  Resp: 16 17  Temp: (!) 97.5 F (36.4 C) 98.8 F (37.1 C)  SpO2: 96% 95%    Intake/Output Summary (Last 24 hours) at 07/07/2023 0756 Last data filed at 07/07/2023 9562 Gross per 24 hour  Intake 720 ml  Output 1725 ml  Net -1005 ml   Filed Weights   06/21/23 0000  Weight: 60 kg    Exam:  General:  Appears calm and comfortable and is in NAD Eyes:  EOMI, normal lids, iris ENT:  grossly normal hearing,  lips & tongue, mmm Neck:  no LAD, masses or thyromegaly Cardiovascular:  RRR, no m/r/g. No LE edema.  Respiratory:   CTA bilaterally with no wheezes/rales/rhonchi.  Normal respiratory effort. Abdomen:  soft, NT, ND Skin:  no rash or induration seen on limited exam Musculoskeletal:  symmetric-appearing BUE/BLE, no bony abnormality, wrist restraints in place Psychiatric:  pleasant mood and affect, speech mildly dysarthric but generally appropriate, still does not understand his circumstances Neurologic:  Able to follow commands and  strength appears symmetric today  Data Reviewed: I have reviewed the patient's lab results since admission.  Pertinent labs for today include:   None today     Family Communication: None present  Disposition: Status is: Inpatient Remains inpatient appropriate because: awaiting placement     Time spent: 25 minutes  Unresulted Labs (From admission, onward)    None        Author: Jonah Blue, MD 07/07/2023 7:56 AM  For on call review [email or web address].

## 2023-07-07 NOTE — TOC Progression Note (Signed)
 Transition of Care [facility]) - Progression Note    Patient Details  Name: KYREN KNICK MRN: [id number] Date of Birth: 1945-07-22  [facility] of [facility] CM/SW Contact  Carley Hammed, LCSW Phone Number: 07/07/2023, 11:41 AM  Clinical Narrative:    CSW reviewed chart and discussed case with previous CSW. Pt with extended hospital stay do to no safe disposition, placement needs, and DSS involvement. DSS pursuing guardianship, CSW left VM for [place] [place] DSS [phone], to get a case update. Plan at this time has been for placement but pt is in and out of restraints, no LT payor, and AMS requiring a 3rd party to consent for him. CSW to start researching facilities and complete workup when more appropriate. TOC will continue to follow.   Expected Discharge Plan: Skilled Nursing Facility Barriers to Discharge: Insurance Authorization, SNF Pending bed offer, Continued Medical Work up, Unsafe home situation  Expected Discharge Plan and Services     Post Acute Care Choice: Skilled Nursing Facility Living arrangements for the past 2 months: Single Family Home                                       Social Determinants of Health (SDOH) Interventions SDOH Screenings   Food Insecurity: No Food Insecurity (06/21/2023)  Housing: Low Risk  (06/21/2023)  Transportation Needs: No Transportation Needs (06/21/2023)  Utilities: Not At Risk (06/21/2023)  Social Connections: Moderately Isolated (06/21/2023)  Tobacco Use: Medium Risk (06/21/2023)    Readmission Risk Interventions     No data to display

## 2023-07-07 NOTE — Progress Notes (Signed)
 Mobility Specialist Progress Note:   07/07/23 1457  Mobility  Activity Transferred from chair to bed;Ambulated with assistance in room  Level of Assistance Standby assist, set-up cues, supervision of patient - no hands on  Assistive Device None  Distance Ambulated (ft) 6 ft  Activity Response Tolerated well  Mobility Referral Yes  Mobility visit 1 Mobility  Mobility Specialist Start Time (ACUTE ONLY) 1442  Mobility Specialist Stop Time (ACUTE ONLY) 1450  Mobility Specialist Time Calculation (min) (ACUTE ONLY) 8 min   Pt received in chair, agreeable to return back to bed. No physical assistance needed to stand and ambulate around bed. No unsteadiness present. Pt left in supine with call bell in reach and all needs met.   [name]  Mobility Specialist Please contact via [employer] office at [phone]

## 2023-07-07 NOTE — Plan of Care (Signed)
  Problem: Clinical Measurements: Goal: Ability to maintain clinical measurements within normal limits will improve Outcome: Progressing   Problem: Activity: Goal: Risk for activity intolerance will decrease Outcome: Progressing   Problem: Nutrition: Goal: Adequate nutrition will be maintained Outcome: Progressing   Problem: Coping: Goal: Level of anxiety will decrease Outcome: Progressing   

## 2023-07-07 NOTE — Progress Notes (Signed)
 Mobility Specialist Progress Note:   07/07/23 1349  Mobility  Activity Ambulated with assistance in hallway  Level of Assistance Contact guard assist, steadying assist  Assistive Device None  Distance Ambulated (ft) 175 ft  Activity Response Tolerated well  Mobility Referral Yes  Mobility visit 1 Mobility  Mobility Specialist Start Time (ACUTE ONLY) 1128  Mobility Specialist Stop Time (ACUTE ONLY) 1140  Mobility Specialist Time Calculation (min) (ACUTE ONLY) 12 min   Pt received in bed, agreeable to mobility. Slight LOB upon standing but pt was able to correct with CG assist. Pt stability improved during ambulation only needing MinG majority of session. Pt denied any dizziness or any discomfort during ambulation, asx throughout. Pt left in chair with call bell in reach and all needs met. Chair alarm on.   [name]  Mobility Specialist Please contact via [employer] [facility] office at [phone]

## 2023-07-08 DIAGNOSIS — I639 Cerebral infarction, unspecified: Secondary | ICD-10-CM | POA: Diagnosis not present

## 2023-07-08 MED ORDER — HALOPERIDOL LACTATE 5 MG/ML IJ SOLN
2.0000 mg | Freq: Once | INTRAMUSCULAR | Status: AC | PRN
  Administered 2023-07-10: 2 mg via INTRAMUSCULAR
  Filled 2023-07-08: qty 1

## 2023-07-08 NOTE — Plan of Care (Signed)

## 2023-07-08 NOTE — Progress Notes (Signed)
 Palliative-   Chart reviewed. Awaiting DSS guardianship. Patient stable for discharge pending guardianship and placement.  Discussed with attending. Palliative will sign off for now- please reconsult if patient's status changes or other Palliative needs are identified.   Ocie Bob, AGNP-C Palliative Medicine  No charge

## 2023-07-08 NOTE — Progress Notes (Addendum)
 Mobility Specialist Progress Note:   07/08/23 1611  Mobility  Activity Transferred from chair to bed  Level of Assistance Standby assist, set-up cues, supervision of patient - no hands on  Assistive Device None  Distance Ambulated (ft) 15 ft  Activity Response Tolerated fair  Mobility Referral Yes  Mobility visit 1 Mobility  Mobility Specialist Start Time (ACUTE ONLY) 1500  Mobility Specialist Stop Time (ACUTE ONLY) 1515  Mobility Specialist Time Calculation (min) (ACUTE ONLY) 15 min   Pt received in chair with chair alarm going off. Pt initially agreeable to transfer back to bed. Once pt sat EOB pt refusing to return to supine. Pt very agitated and noncompliant also refusing to return back to chair. RN notified. Pt left EOB with call bell in reach and bed alarm on.   [name]  Mobility Specialist Please contact via [facility] office at [phone]

## 2023-07-08 NOTE — Progress Notes (Signed)
 Progress Note   Patient: Dennis [name]:[id number] DOB: Oct 05, 1945 DOA: 06/21/2023     16 DOS: the patient was seen and examined on 07/08/2023   Brief hospital course: 77yo with h/o HLD, BPH, HTN, dementia, RA, COPD, OSA not on CPAP, and CVA who presented on 2/1 with AMS, dysarthria.  He was found to have an acute infarct in the L posterior subinsular white matter on MRI.  He and his significant other both have dementia, awaiting guardianship from social services.  Assessment and Plan:  Awaiting Placement Patient has completed treatment for acute CVA, medically stable since 2/3 He lived independently with his wife PTA and is not currently safe to return home (his wife also has dementia so DSS is seeking guardianship for them both) Once guardianship is obtained, he will need placement DSS is also working on LTC Medicaid for him   Acute CVA Presented with AMS and dysarthria Aspirin and Plavix have been discontinued Continue Eliquis and Lipitor Appreciate ongoing PT/OT and speech involvement Awaiting SNF placement   HTN BPs have been more consistently elevated  Added carvedilol 3.125 mg BID with good result Stop hydralazine and titrate GDMT as possible   Dysphagia SLP consulting Recommended for dysphagia 1 diet with nectar-thick liquid Underwent MBS on 2/11 with significantly improved swallow function associated with improved cognition Diet advanced to dysphagia 2 with thin liquids   Dementia Avoid restraints if possible (yesterday they were needed, as he was trying to leave the unit to find his wife) Increased Seroquel to 25 mg qAM and 50 mg at bedtime prn Haldol discontinued on 2/17   HFrEF Euvolemic at present on GDMT Carvedilol which was recently added and then stopped and is resumed again EF 25%, global hypokinesis Continue Imdur   BPH Continue tamsulosin and finasteride   Stage 3a CKD Appears to be stable at this time Attempt to avoid nephrotoxic  medications Recheck BMP periodically        Consultants: Neurology Cardiology Palliative care PT OT SLP TOC tem   Procedures: Echocardiogram 2/1 DVT US 2/1 Modified barium swallow evaluation 2/12   Antibiotics: None   30 Day Unplanned Readmission Risk Score    Flowsheet Row ED to Hosp-Admission (Current) from 06/21/2023 in [facility]  SURGICAL  30 Day Unplanned Readmission Risk Score (%) 32.57 Filed at 07/08/2023 0801       This score is the patient's risk of an unplanned readmission within 30 days of being discharged (0 -100%). The score is based on dignosis, age, lab data, medications, orders, and past utilization.   Low:  0-14.9   Medium: 15-21.9   High: 22-29.9   Extreme: 30 and above           Subjective: Pleasant, eating breakfast, reports buttocks pain and wants to move to chair.   Objective: Vitals:   07/08/23 0753 07/08/23 0814  BP: (!) 120/59 (!) 120/59  Pulse: 96 96  Resp: 17   Temp: 97.9 F (36.6 C)   SpO2: 92%     Intake/Output Summary (Last 24 hours) at 07/08/2023 1257 Last data filed at 07/08/2023 1100 Gross per 24 hour  Intake 880 ml  Output 1900 ml  Net -1020 ml   Filed Weights   06/21/23 0000  Weight: 60 kg    Exam:  General:  Appears calm and comfortable and is in NAD Eyes:  EOMI, normal lids, iris ENT:  grossly normal hearing, lips & tongue, mmm Neck:  no LAD, masses or  thyromegaly Cardiovascular:  RRR, no m/r/g. No LE edema.  Respiratory:   CTA bilaterally with no wheezes/rales/rhonchi.  Normal respiratory effort. Abdomen:  soft, NT, ND Skin:  no rash or induration seen on limited exam Musculoskeletal:  symmetric-appearing BUE/BLE, no bony abnormality, wrist restraints in place Psychiatric:  pleasant mood and affect, speech mildly dysarthric but generally appropriate, still does not understand his circumstances Neurologic:  Able to follow commands and strength appears symmetric today  Data  Reviewed: I have reviewed the patient's lab results since admission.  Pertinent labs for today include:   None today     Family Communication: None present  Disposition: Status is: Inpatient Remains inpatient appropriate because: awaiting placement     Time spent: 25 minutes  Unresulted Labs (From admission, onward)     Start     Ordered   07/09/23 0500  CBC with Differential/Platelet  Tomorrow morning,   R       Question:  Specimen collection method  Answer:  Lab=Lab collect   07/08/23 0815   07/09/23 0500  Basic metabolic panel  Tomorrow morning,   R       Question:  Specimen collection method  Answer:  Lab=Lab collect   07/08/23 0815             Author: Jonah Blue, MD 07/08/2023 12:57 PM  For on call review [email or web address].

## 2023-07-08 NOTE — Progress Notes (Signed)
 Pt transferred from chair to bed. Bed alarm on. Call light in reach. Will continue to monitor pt.

## 2023-07-08 NOTE — Progress Notes (Signed)
 Mobility Specialist Progress Note:   07/08/23 1036  Mobility  Activity Ambulated with assistance in hallway  Level of Assistance  (MinG)  Assistive Device None  Distance Ambulated (ft) 200 ft  Activity Response Tolerated well  Mobility Referral Yes  Mobility visit 1 Mobility  Mobility Specialist Start Time (ACUTE ONLY) 0945  Mobility Specialist Stop Time (ACUTE ONLY) 1000  Mobility Specialist Time Calculation (min) (ACUTE ONLY) 15 min   Pt received in bed, agreeable to mobility. Upon standing pt requesting to use BR before ambulation. Void successful. Pt only needing MinG during ambulation for safety. No unsteadiness present. Pt left in chair with call bell in reach and all needs met. Chair alarm on.   [name]  Mobility Specialist Please contact via [facility] office at [phone]

## 2023-07-08 NOTE — Progress Notes (Signed)
 Physical Therapy Treatment Patient Details Name: Dennis [name] MRN: [id number] DOB: [birth year] Today's Date: 07/08/2023   History of Present Illness The pt is a 78 yo male presenting 2/1 from home with concern for stroke (slurred speech and mild L facial droop). MRI showed:  small area of acute infarct in the more superior left frontal cortex and 2 areas of subacute ischemia in superior R frontal lobe. PMH includes: dementia, arthritis, HTN, HLD, RA, COPD, OSA, and CVA.    PT Comments  Pt received in supine, attempting to get out of bed with bed alarm sounding as PTA and NT arrived to his room. Pt Supervision to sit up to EOB using hospital bed features and CGA to minA for transfers and gait with HHA. Without UE support, pt holding onto furniture for support when mobilizing around his room. Pt scored 13/24 on Dynamic Gait Index this date. Scores of 19 or less are predictive of falls in older community living adults, esp given pt increased confusion/dysarthria after new CVA. Pt with some improved environmental awareness this date compared with previous session. Pt up in recliner with lap belt alarm on for safety (able to demo back belt removal and call bell) and agreeable to eat lunch and sit up >1 hour at end of session. Pt will continue to benefit from skilled rehab in a post acute setting < 3 hours per day to maximize functional gains before returning home.     If plan is discharge home, recommend the following: Direct supervision/assist for medications management;Help with stairs or ramp for entrance;Assist for transportation;Assistance with feeding;Direct supervision/assist for financial management;Assistance with cooking/housework;Supervision due to cognitive status;A little help with walking and/or transfers;A little help with bathing/dressing/bathroom   Can travel by private vehicle     Yes  Equipment Recommendations  Other (comment) (TBD, per chart review he may have RW at home)     Recommendations for Other Services       Precautions / Restrictions Precautions Precautions: Fall Recall of Precautions/Restrictions: Impaired Precaution/Restrictions Comments: decreased awareness of call bell use/location causing higher fall risk; watch BP if sx Restrictions Weight Bearing Restrictions Per Provider Order: No     Mobility  Bed Mobility Overal bed mobility: Needs Assistance Bed Mobility: Supine to Sit     Supine to sit: Supervision, HOB elevated, Used rails     General bed mobility comments: cues for safety, pt using hospital bed features    Transfers Overall transfer level: Needs assistance Equipment used: None, 1 person hand held assist Transfers: Sit to/from Stand Sit to Stand: Min assist           General transfer comment: CGA to rise from EOB, but minA for stand>sit assist to chair as pt attempting to sit prior to reaching close proximity to his chair, and not reaching back to both arm rests.    Ambulation/Gait Ambulation/Gait assistance: Min assist Gait Distance (Feet): 100 Feet (x2 with break) Assistive device: 1 person hand held assist Gait Pattern/deviations: Step-through pattern, Decreased step length - right, Decreased weight shift to left, Shuffle, Drifts right/left Gait velocity: decreased     General Gait Details: RUE HHA min assist fade to CGA, increased assist to minA with dynamic standing tasks such as stepping over glove box and speed changes   Stairs Stairs: Yes Stairs assistance: Contact guard assist, Min assist Stair Management: One rail Right, Step to pattern, Forwards, No rails Number of Stairs: 3 General stair comments: minA when not using rail via UE support, CGA  when using rails, pt self-selects step-to pattern when pt cued not to use rails   Wheelchair Mobility     Tilt Bed    Modified Rankin (Stroke Patients Only)       Balance Overall balance assessment: Needs assistance Sitting-balance support: No  upper extremity supported Sitting balance-Leahy Scale: Good     Standing balance support: Single extremity supported, During functional activity, No upper extremity supported Standing balance-Leahy Scale: Fair Standing balance comment: fair with dynamic challenges, good static standing today unsupported                 Standardized Balance Assessment Standardized Balance Assessment : Dynamic Gait Index   Dynamic Gait Index Level Surface: Mild Impairment Change in Gait Speed: Moderate Impairment Gait with Horizontal Head Turns: Mild Impairment Gait with Vertical Head Turns: Mild Impairment Gait and Pivot Turn: Moderate Impairment Step Over Obstacle: Mild Impairment Step Around Obstacles: Mild Impairment Steps: Moderate Impairment Total Score: 13      Communication Communication Communication: Impaired Factors Affecting Communication: Reduced clarity of speech (dysarthria due to CVA)  Cognition Arousal: Alert Behavior During Therapy: WFL for tasks assessed/performed   PT - Cognitive impairments: History of cognitive impairments                       PT - Cognition Comments: Pt getting up OOB with bed alarm sounding when PTA arrived to his room. Patient with impaired focused attention in hallway, distracted by enviroment, initially appears oriented to self but not fully to situation or location or time. Following commands: Intact Following commands impaired: Only follows one step commands consistently, Follows one step commands with increased time    Cueing Cueing Techniques: Verbal cues, Gestural cues  Exercises      General Comments General comments (skin integrity, edema, etc.): no visible skin breakdown, pt has sacral foam covering lower back, PTA assisting pt to fix his gown to keep his back covered at beginning of session, socks already donned. Pt agreeable to sit in chair with lap belt alarm on for safety at end of session, pt shown how to use call bell  and remove belt and able to demo back when prompted. Pt set up to eat his lunch.      Pertinent Vitals/Pain Pain Assessment Pain Assessment: No/denies pain Faces Pain Scale: No hurt Pain Intervention(s): Monitored during session, Repositioned    Home Living                          Prior Function            PT Goals (current goals can now be found in the care plan section) Acute Rehab PT Goals Patient Stated Goal: to go home so I can help my wife who has dementia PT Goal Formulation: With patient Time For Goal Achievement: 07/18/23 Progress towards PT goals: Progressing toward goals    Frequency    Min 1X/week      PT Plan      Co-evaluation              AM-PAC PT "6 Clicks" Mobility   Outcome Measure  Help needed turning from your back to your side while in a flat bed without using bedrails?: A Little (w/o rails) Help needed moving from lying on your back to sitting on the side of a flat bed without using bedrails?: A Little Help needed moving to and from a bed to a  chair (including a wheelchair)?: A Little Help needed standing up from a chair using your arms (e.g., wheelchair or bedside chair)?: A Little Help needed to walk in hospital room?: A Lot (mod safety cues) Help needed climbing 3-5 steps with a railing? : A Lot (mod safety cues) 6 Click Score: 16    End of Session Equipment Utilized During Treatment: Gait belt Activity Tolerance: Patient tolerated treatment well Patient left: in chair;with call bell/phone within reach;with chair alarm set Nurse Communication: Mobility status;Other (comment) (lap belt alarm on and pt set up to eat his lunch) PT Visit Diagnosis: Unsteadiness on feet (R26.81);Muscle weakness (generalized) (M62.81);Other abnormalities of gait and mobility (R26.89);Difficulty in walking, not elsewhere classified (R26.2);Pain     Time: 1345-1418 PT Time Calculation (min) (ACUTE ONLY): 33 min  Charges:    $Gait Training:  8-22 mins $Therapeutic Activity: 8-22 mins PT General Charges $$ ACUTE PT VISIT: 1 Visit                     Shavon Zenz P., PTA Acute Rehabilitation Services Secure [facility] Preferred 9a-5:30pm Office: [phone]    Dorathy Kinsman [facility] 07/08/2023, 3:21 PM

## 2023-07-08 NOTE — Progress Notes (Signed)
   07/08/23 0753  Vitals  Temp 97.9 F (36.6 C)  Temp Source Oral  BP (!) 120/59  MAP (mmHg) 77  BP Location Right Arm  BP Method Automatic  Patient Position (if appropriate) Lying  Pulse Rate 96  Pulse Rate Source Monitor  Resp 17  Level of Consciousness  Level of Consciousness Alert  MEWS COLOR  MEWS Score Color Green  Oxygen Therapy  SpO2 92 %  O2 Device Room Air  Pain Assessment  Pain Scale 0-10  Pain Score 0  MEWS Score  MEWS Temp 0  MEWS Systolic 0  MEWS Pulse 0  MEWS RR 0  MEWS LOC 0  MEWS Score 0

## 2023-07-08 NOTE — Progress Notes (Signed)
 Speech Language Pathology Treatment: Dysphagia  Patient Details Name: Dennis [name] MRN: [id number] DOB: [birth year] Today's Date: 07/08/2023 Time: 0102-7253 SLP Time Calculation (min) (ACUTE ONLY): 15 min  Assessment / Plan / Recommendation Clinical Impression  Patient seen by SLP for skilled treatment focused on dysphagia goals. He had just finished working with mobility specialist. He continues to be confused and speech dysarthric as well as language impaired. He told SLP "I'm waiting for my wife." He was receptive to eating some graham crackers and drinking a cola. Mildly prolonged mastication but no overt s/s aspiration and overall appeared to tolerate PO's well. SLP inspected patient's lunch tray which was in the room and 100% had been consumed. SLP plans to assess patient's toleration of a trial tray of mechanical soft solids next visit.    HPI HPI: Pt is a 78 y.o. male presented to ED after significant other noted him to be acutely more confused than his baseline and having speech difficulty. EMS noted him to have (L) facial droop and expressive aphasia as well as hypertensive en route to ED. MRI (06/21/23) revealed, "Punctate acute infarct in the left posterior subinsular white matter... small area of acute infarct in the more superior left frontal cortex. There also 2 punctate areas of diffusion hyperintensity without ADC correlate in the superior right frontal lobe which may be areas of subacute ischemia". Previous acute SLP services provided for dysphagia on 03/03/23, recommended advancement of diet to dys 3/thin liquids given improvement in mentation and respiratory status. PMH: HLD, BPH, HTN, dementia, rheumatoid arthritis, COPD, Barrett's esophagus, OSA does not use CPAP, history of CVA.      SLP Plan  Continue with current plan of care      Recommendations for follow up therapy are one component of a multi-disciplinary discharge planning process, led by the attending physician.   Recommendations may be updated based on patient status, additional functional criteria and insurance authorization.    Recommendations  Diet recommendations: Dysphagia 2 (fine chop);Thin liquid Liquids provided via: Cup;Straw Medication Administration: Whole meds with puree Supervision: Patient able to self feed Compensations: Slow rate;Small sips/bites;Minimize environmental distractions Postural Changes and/or Swallow Maneuvers: Seated upright 90 degrees                  Oral care BID   Intermittent Supervision/Assistance Dysphagia, oropharyngeal phase (R13.12)     Continue with current plan of care    Angela Nevin, MA, CCC-SLP Speech Therapy

## 2023-07-09 DIAGNOSIS — I639 Cerebral infarction, unspecified: Secondary | ICD-10-CM | POA: Diagnosis not present

## 2023-07-09 LAB — CBC WITH DIFFERENTIAL/PLATELET
Abs Immature Granulocytes: 0.12 10*3/uL — ABNORMAL HIGH (ref 0.00–0.07)
Basophils Absolute: 0.1 10*3/uL (ref 0.0–0.1)
Basophils Relative: 1 %
Eosinophils Absolute: 0.4 10*3/uL (ref 0.0–0.5)
Eosinophils Relative: 3 %
HCT: 37.2 % — ABNORMAL LOW (ref 39.0–52.0)
Hemoglobin: 11.8 g/dL — ABNORMAL LOW (ref 13.0–17.0)
Immature Granulocytes: 1 %
Lymphocytes Relative: 22 %
Lymphs Abs: 2.7 10*3/uL (ref 0.7–4.0)
MCH: 27.8 pg (ref 26.0–34.0)
MCHC: 31.7 g/dL (ref 30.0–36.0)
MCV: 87.5 fL (ref 80.0–100.0)
Monocytes Absolute: 1.1 10*3/uL — ABNORMAL HIGH (ref 0.1–1.0)
Monocytes Relative: 9 %
Neutro Abs: 8 10*3/uL — ABNORMAL HIGH (ref 1.7–7.7)
Neutrophils Relative %: 64 %
Platelets: 366 10*3/uL (ref 150–400)
RBC: 4.25 MIL/uL (ref 4.22–5.81)
RDW: 15.5 % (ref 11.5–15.5)
WBC: 12.3 10*3/uL — ABNORMAL HIGH (ref 4.0–10.5)
nRBC: 0 % (ref 0.0–0.2)

## 2023-07-09 LAB — BASIC METABOLIC PANEL
Anion gap: 8 (ref 5–15)
BUN: 27 mg/dL — ABNORMAL HIGH (ref 8–23)
CO2: 27 mmol/L (ref 22–32)
Calcium: 9.8 mg/dL (ref 8.9–10.3)
Chloride: 104 mmol/L (ref 98–111)
Creatinine, Ser: 1.37 mg/dL — ABNORMAL HIGH (ref 0.61–1.24)
GFR, Estimated: 53 mL/min — ABNORMAL LOW (ref >60)
Glucose, Bld: 91 mg/dL (ref 70–99)
Potassium: 4.5 mmol/L (ref 3.5–5.1)
Sodium: 139 mmol/L (ref 135–145)
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
## 2023-07-09 NOTE — Progress Notes (Signed)
   07/09/23 0722  Vitals  Temp 98.2 F (36.8 C)  BP 126/65  MAP (mmHg) 83  BP Location Right Arm  BP Method Automatic  Patient Position (if appropriate) Lying  Pulse Rate 78  Pulse Rate Source Monitor  Resp 16  Level of Consciousness  Level of Consciousness Alert  MEWS COLOR  MEWS Score Color Green  Oxygen Therapy  SpO2 96 %  O2 Device Room Air  Pain Assessment  Pain Scale 0-10  Pain Score 0  MEWS Score  MEWS Temp 0  MEWS Systolic 0  MEWS Pulse 0  MEWS RR 0  MEWS LOC 0  MEWS Score 0
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
## 2023-07-09 NOTE — TOC Progression Note (Addendum)
 Transition of Care [facility]) - Progression Note    Patient Details  Name: Dennis [name] MRN: [id number] Date of Birth: [birth year]  [facility] CM/SW Contact  Carley Hammed, LCSW Phone Number: 07/09/2023, 10:18 AM  Clinical Narrative:    CSW notified pt is back in restraints due to agitation. CSW attempted to follow up with DSS again to discuss case. VM was left for DSS worker Marlana Latus. TOC will continue to follow.   11:00 CSW spoke with DSS worker Joni Reining who noted they had filed an Ex Parte for emergency Guardianship and are waiting on a court date. CSW and Joni Reining discussed the need for Medicaid to pay for placement, she will start it. Level of care at discharge was discussed, pt walked 200 ft, CGA. At this time he seems to be more Memory Care locked unit appropriate. Joni Reining and CSW discussed current treatment options and DSS is requesting Psych or Neurology be involved to assist with med management for behaviors and to have documentation from the court system. CSW passed this request along to the medical team. TOC will continue to follow.  Expected Discharge Plan: Skilled Nursing Facility Barriers to Discharge: Insurance Authorization, SNF Pending bed offer, Continued Medical Work up, Unsafe home situation  Expected Discharge Plan and Services     Post Acute Care Choice: Skilled Nursing Facility Living arrangements for the past 2 months: Single Family Home                                       Social Determinants of Health (SDOH) Interventions SDOH Screenings   Food Insecurity: No Food Insecurity (06/21/2023)  Housing: Low Risk  (06/21/2023)  Transportation Needs: No Transportation Needs (06/21/2023)  Utilities: Not At Risk (06/21/2023)  Social Connections: Moderately Isolated (06/21/2023)  Tobacco Use: Medium Risk (06/21/2023)    Readmission Risk Interventions     No data to display

## 2023-07-10 DIAGNOSIS — I639 Cerebral infarction, unspecified: Secondary | ICD-10-CM | POA: Diagnosis not present

## 2023-07-10 DIAGNOSIS — R451 Restlessness and agitation: Secondary | ICD-10-CM | POA: Diagnosis not present

## 2023-07-10 NOTE — Progress Notes (Signed)
 PROGRESS NOTE  Dennis [name] YQM:[id number] DOB: 10-15-1945 DOA: 06/21/2023 PCP: Pcp, No   LOS: 18 days   Brief narrative:  78 years old male with past medical history of hyperlipidemia, BPH, HTN, dementia, RA, COPD, OSA not on CPAP, and CVA who presented to the hospital on 2/1 with mental status and dysarthria.  He was found to have an acute infarct in the L posterior subinsular white matter on MRI.  He and his significant other both have dementia, awaiting guardianship from social services.    Assessment/Plan: Principal Problem:   Acute CVA (cerebrovascular accident) (HCC) Active Problems:   HLD (hyperlipidemia)   HTN (hypertension)   BPH (benign prostatic hyperplasia)   Dysphagia   Dementia with behavioral disturbance (HCC)  Assessment and Plan:  Acute CVA Presented with AMS and dysarthria.  Continue Eliquis and Lipitor.  PT OT recommended skilled nursing facility placement.   HTN Continue Coreg.  Hydralazine has been discontinued.  Titrate GDMT as possible   Dysphagia Underwent MBS on 2/11 with significantly improved swallow function associated with improved cognition. Diet has been advanced to dysphagia 2 with thin liquids   Dementia On Seroquel. Might need to adjust medications for adequate control of agitation.  Was on restraints yesterday. Will get psychiatry assistance for medication management and further care.  HFrEF Continue Coreg.  Last known ejection fraction of 25% with global hypokinesis.  Continue Imdur.  BPH Continue tamsulosin and finasteride   Stage 3a CKD Continue to monitor.  Disposition. He lived independently with his wife PTA and is not currently safe to return home (his wife also has dementia so DSS is seeking guardianship for them both). Once guardianship is obtained, he will need placement. DSS is also working on LTC Medicaid for him  DVT prophylaxis:  apixaban (ELIQUIS) tablet 5 mg   Disposition: Awaiting for placement.  Status is:  Inpatient  Remains inpatient appropriate because: Awaiting for placement, TOC involved.    Code Status:     Code Status: Full Code  Family Communication: None present  Consultants: Palliative care Cardiology Neurology Psychiatry.  Procedures: Modified barium swallow  Anti-infectives:  None currently  Anti-infectives (From admission, onward)    None       Subjective: Today, patient was seen and examined at bedside.  Patient seen and examined at bedside.  Was on restraints and agitated yesterday but this morning.  More calm this morning  Objective: Vitals:   07/10/23 0453 07/10/23 0744  BP: (!) 118/55 110/61  Pulse: 67 66  Resp: 18 17  Temp: (!) 97.5 F (36.4 C) (!) 97.4 F (36.3 C)  SpO2: 95% 92%    Intake/Output Summary (Last 24 hours) at 07/10/2023 1006 Last data filed at 07/10/2023 0509 Gross per 24 hour  Intake 200 ml  Output 350 ml  Net -150 ml   Filed Weights   06/21/23 0000  Weight: 60 kg   Body mass index is 22.71 kg/m.   Physical Exam:  GENERAL: Patient is alert awake and Communicative but confused, calmer this morning.  Disoriented.  Has underlying dementia,.  Elderly male, chronically ill and deconditioned HENT: No scleral pallor or icterus. Pupils equally reactive to light. Oral mucosa is moist NECK: is supple, no gross swelling noted. CHEST: Clear to auscultation. No crackles or wheezes.   CVS: S1 and S2 heard, no murmur. Regular rate and rhythm.  ABDOMEN: Soft, non-tender, bowel sounds are present. EXTREMITIES: No edema. CNS: Cranial nerves are intact.  Moves all extremities. SKIN: warm and  dry without rashes.  Data Review: I have personally reviewed the following laboratory data and studies,  CBC: Recent Labs  Lab 07/05/23 1121 07/09/23 0543  WBC 9.1 12.3*  NEUTROABS 5.6 8.0*  HGB 12.1* 11.8*  HCT 37.7* 37.2*  MCV 87.1 87.5  PLT 346 366   Basic Metabolic Panel: Recent Labs  Lab 07/05/23 1121 07/09/23 0543  NA 138  139  K 4.0 4.5  CL 101 104  CO2 28 27  GLUCOSE 119* 91  BUN 27* 27*  CREATININE 1.63* 1.37*  CALCIUM 9.5 9.8   Liver Function Tests: No results for input(s): "AST", "ALT", "ALKPHOS", "BILITOT", "PROT", "ALBUMIN" in the last 168 hours. No results for input(s): "LIPASE", "AMYLASE" in the last 168 hours. No results for input(s): "AMMONIA" in the last 168 hours. Cardiac Enzymes: No results for input(s): "CKTOTAL", "CKMB", "CKMBINDEX", "TROPONINI" in the last 168 hours. BNP (last 3 results) Recent Labs    03/01/23 0245 03/03/23 0313 06/22/23 1251  BNP 1,535.3* 489.2* 946.6*    ProBNP (last 3 results) No results for input(s): "PROBNP" in the last 8760 hours.  CBG: No results for input(s): "GLUCAP" in the last 168 hours. No results found for this or any previous visit (from the past 240 hours).   Studies: No results found.    Joycelyn Das, MD  [facility] 07/10/2023  If 7PM-7AM, please contact night-coverage
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
## 2023-07-10 NOTE — Progress Notes (Signed)
 Speech Language Pathology Treatment: Dysphagia  Patient Details Name: Dennis [name] MRN: [id number] DOB: 12-18-1945 Today's Date: 07/10/2023 Time: 5284-1324 SLP Time Calculation (min) (ACUTE ONLY): 10 min  Assessment / Plan / Recommendation Clinical Impression  Pt received with nearly completed Dy2 meal tray. Per RN, no concerns for aspiration or pocketing with current diet consistency. Pt accepted trials of crackers. Oral prep and transit observed to be efficient with complete oral clearance. No overt or subtle s/s of aspiration observed with trials.   Diet advanced to mechanical soft consistency. SLP will follow up to assess diet tolerance and advance diet further as indicated.    HPI HPI: Pt is a 78 y.o. male presented to ED after significant other noted him to be acutely more confused than his baseline and having speech difficulty. EMS noted him to have (L) facial droop and expressive aphasia as well as hypertensive en route to ED. MRI (06/21/23) revealed, "Punctate acute infarct in the left posterior subinsular white matter... small area of acute infarct in the more superior left frontal cortex. There also 2 punctate areas of diffusion hyperintensity without ADC correlate in the superior right frontal lobe which may be areas of subacute ischemia". Previous acute SLP services provided for dysphagia on 03/03/23, recommended advancement of diet to dys 3/thin liquids given improvement in mentation and respiratory status. PMH: HLD, BPH, HTN, dementia, rheumatoid arthritis, COPD, Barrett's esophagus, OSA does not use CPAP, history of CVA.      SLP Plan  Continue with current plan of care      Recommendations for follow up therapy are one component of a multi-disciplinary discharge planning process, led by the attending physician.  Recommendations may be updated based on patient status, additional functional criteria and insurance authorization.    Recommendations  Diet recommendations: Dysphagia  3 (mechanical soft);Thin liquid Liquids provided via: Cup;Straw Medication Administration: Whole meds with liquid Supervision: Patient able to self feed Compensations: Slow rate;Small sips/bites;Minimize environmental distractions Postural Changes and/or Swallow Maneuvers: Seated upright 90 degrees                  Oral care BID   Intermittent Supervision/Assistance Dysphagia, unspecified (R13.10)     Continue with current plan of care     Dennis [name]  07/10/2023, 5:48 PM

## 2023-07-10 NOTE — Plan of Care (Signed)
  Problem: Clinical Measurements: Goal: Will remain free from infection Outcome: Progressing   Problem: Activity: Goal: Risk for activity intolerance will decrease Outcome: Progressing   Problem: Nutrition: Goal: Adequate nutrition will be maintained Outcome: Progressing   Problem: Pain Managment: Goal: General experience of comfort will improve and/or be controlled Outcome: Progressing   Problem: Skin Integrity: Goal: Risk for impaired skin integrity will decrease Outcome: Progressing   Problem: Health Behavior/Discharge Planning: Goal: Ability to manage health-related needs will improve Outcome: Not Progressing   Problem: Safety: Goal: Ability to remain free from injury will improve Outcome: Not Progressing

## 2023-07-10 NOTE — Plan of Care (Signed)
  Problem: Clinical Measurements: Goal: Ability to maintain clinical measurements within normal limits will improve Outcome: Progressing   Problem: Activity: Goal: Risk for activity intolerance will decrease Outcome: Progressing   Problem: Nutrition: Goal: Adequate nutrition will be maintained Outcome: Progressing   Problem: Coping: Goal: Level of anxiety will decrease Outcome: Progressing   

## 2023-07-11 DIAGNOSIS — I639 Cerebral infarction, unspecified: Secondary | ICD-10-CM | POA: Diagnosis not present

## 2023-07-11 MED ORDER — HALOPERIDOL LACTATE 5 MG/ML IJ SOLN
2.0000 mg | Freq: Once | INTRAMUSCULAR | Status: DC
  Filled 2023-07-11: qty 1

## 2023-07-11 NOTE — Plan of Care (Signed)
  Problem: Coping: Goal: Level of anxiety will decrease Outcome: Progressing   Problem: Elimination: Goal: Will not experience complications related to bowel motility Outcome: Progressing   Problem: Pain Managment: Goal: General experience of comfort will improve and/or be controlled Outcome: Progressing   Problem: Skin Integrity: Goal: Risk for impaired skin integrity will decrease Outcome: Progressing

## 2023-07-11 NOTE — TOC Progression Note (Signed)
 Transition of Care [facility]) - Progression Note    Patient Details  Name: NEWT LEVINGSTON MRN: [id number] Date of Birth: July 24, 1945  [facility] of [facility]) CM/SW Contact  Mearl Latin, LCSW Phone Number: 07/11/2023, 2:22 PM  Clinical Narrative:    CSW continuing to follow and await APS Guardianship.    Expected Discharge Plan: Skilled Nursing Facility Barriers to Discharge: Insurance Authorization, SNF Pending bed offer, Continued Medical Work up, Unsafe home situation  Expected Discharge Plan and Services     Post Acute Care Choice: Skilled Nursing Facility Living arrangements for the past 2 months: Single Family Home                                       Social Determinants of Health (SDOH) Interventions SDOH Screenings   Food Insecurity: No Food Insecurity (06/21/2023)  Housing: Low Risk  (06/21/2023)  Transportation Needs: No Transportation Needs (06/21/2023)  Utilities: Not At Risk (06/21/2023)  Social Connections: Moderately Isolated (06/21/2023)  Tobacco Use: Medium Risk (06/21/2023)    Readmission Risk Interventions     No data to display

## 2023-07-11 NOTE — Plan of Care (Signed)
  Problem: Safety: Goal: Ability to remain free from injury will improve Outcome: Progressing   Problem: Coping: Goal: Level of anxiety will decrease Outcome: Progressing   Problem: Activity: Goal: Risk for activity intolerance will decrease Outcome: Progressing   

## 2023-07-11 NOTE — Progress Notes (Signed)
 Mobility Specialist Progress Note:    07/11/23 1202  Mobility  Activity Ambulated with assistance in hallway  Level of Assistance Minimal assist, patient does 75% or more  Assistive Device Other (Comment) (HHA)  Distance Ambulated (ft) 200 ft  Activity Response Tolerated well  Mobility Referral Yes  Mobility visit 1 Mobility  Mobility Specialist Start Time (ACUTE ONLY) 1120  Mobility Specialist Stop Time (ACUTE ONLY) 1135  Mobility Specialist Time Calculation (min) (ACUTE ONLY) 15 min   Pt received in bed agreeable to mobility. Pt required no physical assistance w/ STS but minA during ambulation d/t unsteadiness w/ turns. Took x1 standing rest break. No c/o throughout. Returned to room w/o fault. Call bell and personal belongings in reach. All needs met. Bed alarm on.  Thompson Grayer Mobility Specialist  Please contact vis Secure Chat or  Rehab Office [phone]

## 2023-07-11 NOTE — Progress Notes (Signed)
 PROGRESS NOTE  Dennis [name] JXB:[id number] DOB: 1945/12/03 DOA: 06/21/2023 PCP: Pcp, No   LOS: 19 days   Brief narrative:  78 years old male with past medical history of hyperlipidemia, BPH, HTN, dementia, RA, COPD, OSA not on CPAP, and CVA who presented to the hospital on 2/1 with mental status and dysarthria.  He was found to have an acute infarct in the L posterior subinsular white matter on MRI.  He and his significant other both have dementia, awaiting guardianship from social services.    Assessment/Plan: Principal Problem:   Acute CVA (cerebrovascular accident) (HCC) Active Problems:   HLD (hyperlipidemia)   HTN (hypertension)   BPH (benign prostatic hyperplasia)   Dysphagia   Dementia with behavioral disturbance (HCC)   Agitation  Assessment and Plan:  Acute CVA Presented with AMS and dysarthria.  Continue Eliquis and Lipitor.  PT OT recommended skilled nursing facility placement.   HTN Continue Coreg.  Hydralazine has been discontinued.  Titrate GDMT as possible   Dysphagia Underwent MBS on 2/11 with significantly improved swallow function associated with improved cognition. Diet has been advanced to dysphagia 2 with thin liquids   Dementia On Seroquel.  Seen by psychiatry in consultation.  Feels like current dosing of Seroquel is adequate for the patient.  HFrEF Continue Coreg.  Last known ejection fraction of 25% with global hypokinesis.  Continue Imdur.  BPH Continue tamsulosin and finasteride   Stage 3a CKD Continue to monitor.  Disposition. He lived independently with his wife PTA and is not currently safe to return home (his wife also has dementia so DSS is seeking guardianship for them both). Once guardianship is obtained, he will need placement. DSS is also working on LTC Medicaid for him  DVT prophylaxis:  apixaban (ELIQUIS) tablet 5 mg   Disposition: Awaiting for placement.  Status is: Inpatient  Remains inpatient appropriate because: Awaiting  for placement, TOC involved.    Code Status:     Code Status: Full Code  Family Communication: None present  Consultants: Palliative care Cardiology Neurology Psychiatry.  Procedures: Modified barium swallow  Anti-infectives:  None currently  Anti-infectives (From admission, onward)    None       Subjective: Today, patient was seen and examined at bedside.  Patient awake confused and disoriented but calm today.   Objective: Vitals:   07/11/23 0459 07/11/23 0748  BP: (!) 123/59 133/66  Pulse: 64 72  Resp: 18 17  Temp: 97.7 F (36.5 C) (!) 97.5 F (36.4 C)  SpO2: 96%     Intake/Output Summary (Last 24 hours) at 07/11/2023 1031 Last data filed at 07/11/2023 0800 Gross per 24 hour  Intake 490 ml  Output 1200 ml  Net -710 ml   Filed Weights   06/21/23 0000  Weight: 60 kg   Body mass index is 22.71 kg/m.   Physical Exam:  GENERAL: Patient is alert awake and Communicative but confused, calm,  Disoriented.  Has underlying dementia,.  Elderly male, chronically ill and deconditioned HENT: No scleral pallor or icterus. Pupils equally reactive to light. Oral mucosa is moist NECK: is supple, no gross swelling noted. CHEST: Clear to auscultation. No crackles or wheezes.   CVS: S1 and S2 heard, no murmur. Regular rate and rhythm.  ABDOMEN: Soft, non-tender, bowel sounds are present. EXTREMITIES: No edema. CNS: Cranial nerves are intact.  Moves all extremities. SKIN: warm and dry without rashes.  Data Review: I have personally reviewed the following laboratory data and studies,  CBC: Recent  Labs  Lab 07/05/23 1121 07/09/23 0543  WBC 9.1 12.3*  NEUTROABS 5.6 8.0*  HGB 12.1* 11.8*  HCT 37.7* 37.2*  MCV 87.1 87.5  PLT 346 366   Basic Metabolic Panel: Recent Labs  Lab 07/05/23 1121 07/09/23 0543  NA 138 139  K 4.0 4.5  CL 101 104  CO2 28 27  GLUCOSE 119* 91  BUN 27* 27*  CREATININE 1.63* 1.37*  CALCIUM 9.5 9.8   Liver Function Tests: No  results for input(s): "AST", "ALT", "ALKPHOS", "BILITOT", "PROT", "ALBUMIN" in the last 168 hours. No results for input(s): "LIPASE", "AMYLASE" in the last 168 hours. No results for input(s): "AMMONIA" in the last 168 hours. Cardiac Enzymes: No results for input(s): "CKTOTAL", "CKMB", "CKMBINDEX", "TROPONINI" in the last 168 hours. BNP (last 3 results) Recent Labs    03/01/23 0245 03/03/23 0313 06/22/23 1251  BNP 1,535.3* 489.2* 946.6*    ProBNP (last 3 results) No results for input(s): "PROBNP" in the last 8760 hours.  CBG: No results for input(s): "GLUCAP" in the last 168 hours. No results found for this or any previous visit (from the past 240 hours).   Studies: No results found.    Joycelyn Das, MD  [facility] 07/11/2023  If 7PM-7AM, please contact night-coverage

## 2023-07-12 DIAGNOSIS — I639 Cerebral infarction, unspecified: Secondary | ICD-10-CM | POA: Diagnosis not present

## 2023-07-12 MED ORDER — POLYETHYLENE GLYCOL 3350 17 G PO PACK
17.0000 g | PACK | Freq: Every day | ORAL | Status: DC
  Administered 2023-07-12 – 2023-08-13 (×32): 17 g via ORAL
  Filled 2023-07-12 (×34): qty 1

## 2023-07-12 MED ORDER — CARMEX CLASSIC LIP BALM EX OINT
TOPICAL_OINTMENT | CUTANEOUS | Status: DC | PRN
Start: 2023-07-12 — End: 2023-08-14
  Filled 2023-07-12: qty 10

## 2023-07-12 MED ORDER — DOCUSATE SODIUM 100 MG PO CAPS
100.0000 mg | ORAL_CAPSULE | Freq: Two times a day (BID) | ORAL | Status: DC
  Administered 2023-07-12 – 2023-08-14 (×53): 100 mg via ORAL
  Filled 2023-07-12 (×57): qty 1

## 2023-07-12 NOTE — Progress Notes (Signed)
   07/12/23 1027  Mobility  Activity Ambulated with assistance in hallway  Level of Assistance Minimal assist, patient does 75% or more  Assistive Device Other (Comment) (HHA)  Distance Ambulated (ft) 450 ft  Activity Response Tolerated well  Mobility Referral Yes  Mobility visit 1 Mobility  Mobility Specialist Start Time (ACUTE ONLY) 1007  Mobility Specialist Stop Time (ACUTE ONLY) 1027  Mobility Specialist Time Calculation (min) (ACUTE ONLY) 20 min   Mobility Specialist: Progress Note  Pt agreeable to mobility session - received in bed. Pt was asymptomatic throughout session with no complaints, eagar to ambulate. Returned to chair with all needs met - call bell within reach. Visitor present. Chair alarm on. Pt unaware of being in the hospital.   Barnie Mort, BS Mobility Specialist Please contact via SecureChat or  Rehab office at [phone].

## 2023-07-12 NOTE — Plan of Care (Signed)
  Problem: Clinical Measurements: Goal: Ability to maintain clinical measurements within normal limits will improve Outcome: Progressing Goal: Will remain free from infection Outcome: Progressing   Problem: Activity: Goal: Risk for activity intolerance will decrease Outcome: Progressing   Problem: Nutrition: Goal: Adequate nutrition will be maintained Outcome: Progressing   Problem: Pain Managment: Goal: General experience of comfort will improve and/or be controlled Outcome: Progressing   Problem: Safety: Goal: Ability to remain free from injury will improve Outcome: Progressing   Problem: Skin Integrity: Goal: Risk for impaired skin integrity will decrease Outcome: Progressing

## 2023-07-12 NOTE — Progress Notes (Addendum)
 PROGRESS NOTE  Dennis [name] ZOX:[id number] DOB: 1945/06/09 DOA: 06/21/2023 PCP: Pcp, No   LOS: 20 days   Brief narrative:  78 years old male with past medical history of hyperlipidemia, BPH, HTN, dementia, RA, COPD, OSA not on CPAP, and CVA who presented to the hospital on 2/1 with mental status and dysarthria.  He was found to have an acute infarct in the L posterior subinsular white matter on MRI.  He and his significant other both have dementia, awaiting guardianship from social services.    Assessment/Plan: Principal Problem:   Acute CVA (cerebrovascular accident) (HCC) Active Problems:   HLD (hyperlipidemia)   HTN (hypertension)   BPH (benign prostatic hyperplasia)   Dysphagia   Dementia with behavioral disturbance (HCC)   Agitation  Assessment and Plan:  Acute CVA Presented with AMS and dysarthria.  Continue Eliquis and Lipitor.  PT OT recommended skilled nursing facility placement.   HTN Continue Coreg.  Hydralazine has been discontinued.  Titrate GDMT as possible   Dysphagia Underwent MBS on 2/11 with significantly improved swallow function associated with improved cognition. Diet has been advanced to dysphagia 2 with thin liquids   Dementia On Seroquel.  Seen by psychiatry in consultation.  Feels like current dosing of Seroquel is adequate for the patient.  Sitting up by the bedside chair.  HFrEF Continue Coreg.  Last known ejection fraction of 25% with global hypokinesis.  Continue Imdur.  BPH Continue tamsulosin and finasteride   Stage 3a CKD Continue to monitor.  Disposition. He lived independently with his wife PTA and is not currently safe to return home (his wife also has dementia so DSS is seeking guardianship for them both). Once guardianship is obtained, he will need placement. DSS is also working on LTC Medicaid for him  DVT prophylaxis:  apixaban (ELIQUIS) tablet 5 mg   Disposition: Awaiting for placement.  Unsafe disposition so far  Status is:  Inpatient  Remains inpatient appropriate because: Awaiting for placement, TOC involved.    Code Status:     Code Status: Full Code  Family Communication: spoke with the patient' neighbor on the phone who updated patient's wife at bediside.  Consultants: Palliative care Cardiology Neurology Psychiatry.  Procedures: Modified barium swallow  Anti-infectives:  None currently  Anti-infectives (From admission, onward)    None       Subjective: Today, patient was seen and examined at bedside.  Patient and friend at bedside.  Patient awake confused and disoriented but medicated when sitting up by the window.  Objective: Vitals:   07/12/23 0531 07/12/23 0813  BP: (!) 148/70 129/62  Pulse: 67 87  Resp: 18 18  Temp: 98.9 F (37.2 C) (!) 97.5 F (36.4 C)  SpO2: 96% 97%    Intake/Output Summary (Last 24 hours) at 07/12/2023 1100 Last data filed at 07/12/2023 0831 Gross per 24 hour  Intake 360 ml  Output 950 ml  Net -590 ml   Filed Weights   06/21/23 0000  Weight: 60 kg   Body mass index is 22.71 kg/m.   Physical Exam:  GENERAL: Patient is alert awake and Communicative but confused, calm,  Disoriented.  Significant cognitive dysfunction ia,.  Elderly male, chronically ill and deconditioned HENT: No scleral pallor or icterus. Pupils equally reactive to light. Oral mucosa is moist NECK: is supple, no gross swelling noted. CHEST: Clear to auscultation. No crackles or wheezes.   CVS: S1 and S2 heard, no murmur. Regular rate and rhythm.  ABDOMEN: Soft, non-tender, bowel sounds are  present. EXTREMITIES: No edema. CNS: Cranial nerves are intact.  Moves all extremities. SKIN: warm and dry without rashes.  Data Review: I have personally reviewed the following laboratory data and studies,  CBC: Recent Labs  Lab 07/05/23 1121 07/09/23 0543  WBC 9.1 12.3*  NEUTROABS 5.6 8.0*  HGB 12.1* 11.8*  HCT 37.7* 37.2*  MCV 87.1 87.5  PLT 346 366   Basic Metabolic  Panel: Recent Labs  Lab 07/05/23 1121 07/09/23 0543  NA 138 139  K 4.0 4.5  CL 101 104  CO2 28 27  GLUCOSE 119* 91  BUN 27* 27*  CREATININE 1.63* 1.37*  CALCIUM 9.5 9.8   Liver Function Tests: No results for input(s): "AST", "ALT", "ALKPHOS", "BILITOT", "PROT", "ALBUMIN" in the last 168 hours. No results for input(s): "LIPASE", "AMYLASE" in the last 168 hours. No results for input(s): "AMMONIA" in the last 168 hours. Cardiac Enzymes: No results for input(s): "CKTOTAL", "CKMB", "CKMBINDEX", "TROPONINI" in the last 168 hours. BNP (last 3 results) Recent Labs    03/01/23 0245 03/03/23 0313 06/22/23 1251  BNP 1,535.3* 489.2* 946.6*    ProBNP (last 3 results) No results for input(s): "PROBNP" in the last 8760 hours.  CBG: No results for input(s): "GLUCAP" in the last 168 hours. No results found for this or any previous visit (from the past 240 hours).   Studies: No results found.    Joycelyn Das, MD  [facility] 07/12/2023  If 7PM-7AM, please contact night-coverage

## 2023-07-12 NOTE — Plan of Care (Signed)
  Problem: Clinical Measurements: Goal: Ability to maintain clinical measurements within normal limits will improve Outcome: Progressing   Problem: Activity: Goal: Risk for activity intolerance will decrease Outcome: Progressing   Problem: Elimination: Goal: Will not experience complications related to bowel motility Outcome: Progressing Goal: Will not experience complications related to urinary retention Outcome: Progressing   Problem: Pain Managment: Goal: General experience of comfort will improve and/or be controlled Outcome: Progressing

## 2023-07-13 DIAGNOSIS — I639 Cerebral infarction, unspecified: Secondary | ICD-10-CM | POA: Diagnosis not present

## 2023-07-13 LAB — BASIC METABOLIC PANEL
Anion gap: 6 (ref 5–15)
BUN: 26 mg/dL — ABNORMAL HIGH (ref 8–23)
CO2: 27 mmol/L (ref 22–32)
Calcium: 9.8 mg/dL (ref 8.9–10.3)
Chloride: 105 mmol/L (ref 98–111)
Creatinine, Ser: 1.35 mg/dL — ABNORMAL HIGH (ref 0.61–1.24)
GFR, Estimated: 54 mL/min — ABNORMAL LOW (ref >60)
Glucose, Bld: 93 mg/dL (ref 70–99)
Potassium: 4.3 mmol/L (ref 3.5–5.1)
Sodium: 138 mmol/L (ref 135–145)

## 2023-07-13 LAB — CBC
HCT: 35.6 % — ABNORMAL LOW (ref 39.0–52.0)
Hemoglobin: 11.4 g/dL — ABNORMAL LOW (ref 13.0–17.0)
MCH: 28 pg (ref 26.0–34.0)
MCHC: 32 g/dL (ref 30.0–36.0)
MCV: 87.5 fL (ref 80.0–100.0)
Platelets: 337 10*3/uL (ref 150–400)
RBC: 4.07 MIL/uL — ABNORMAL LOW (ref 4.22–5.81)
RDW: 15.5 % (ref 11.5–15.5)
WBC: 9.1 10*3/uL (ref 4.0–10.5)
nRBC: 0 % (ref 0.0–0.2)

## 2023-07-13 NOTE — Progress Notes (Signed)
 Pt has attempted to get out of bed multiple times. Pt has attempted to remove his catheter multiple times. Pt hit the staff when trying to put him back in bed. Pt has been placed in lap belt and soft wrist restraints per MD order

## 2023-07-13 NOTE — Progress Notes (Signed)
 Mobility Specialist Progress Note:   07/13/23 1218  Mobility  Activity Ambulated with assistance in hallway  Level of Assistance  (MinG)  Assistive Device None  Distance Ambulated (ft) 450 ft  Activity Response Tolerated well  Mobility Referral Yes  Mobility visit 1 Mobility  Mobility Specialist Start Time (ACUTE ONLY) 1015  Mobility Specialist Stop Time (ACUTE ONLY) 1030  Mobility Specialist Time Calculation (min) (ACUTE ONLY) 15 min   Pt received in bed, pleasant and agreeable to mobility. No complaints stated during session. MinG for safety during ambulation. Pt left in chair with call bell in reach all needs met. Chair alarm on.   [name]  Mobility Specialist Please contact via SecureChat Rehab office at [phone]

## 2023-07-13 NOTE — Plan of Care (Signed)
  Problem: Clinical Measurements: Goal: Ability to maintain clinical measurements within normal limits will improve Outcome: Progressing Goal: Will remain free from infection Outcome: Progressing   Problem: Nutrition: Goal: Adequate nutrition will be maintained Outcome: Progressing   Problem: Elimination: Goal: Will not experience complications related to bowel motility Outcome: Progressing Goal: Will not experience complications related to urinary retention Outcome: Progressing   Problem: Pain Managment: Goal: General experience of comfort will improve and/or be controlled Outcome: Progressing   Problem: Safety: Goal: Ability to remain free from injury will improve Outcome: Progressing   Problem: Skin Integrity: Goal: Risk for impaired skin integrity will decrease Outcome: Progressing

## 2023-07-13 NOTE — Progress Notes (Signed)
 PROGRESS NOTE  Dennis [name] ZOX:[id number] DOB: 08-Jul-1945 DOA: 06/21/2023 PCP: Pcp, No   LOS: 21 days   Brief narrative:  78 years old male with past medical history of hyperlipidemia, BPH, HTN, dementia, RA, COPD, OSA not on CPAP, and CVA who presented to the hospital on 2/1 with mental status and dysarthria.  He was found to have an acute infarct in the L posterior subinsular white matter on MRI.  He and his significant other both have dementia, awaiting guardianship from social services.    Assessment/Plan: Principal Problem:   Acute CVA (cerebrovascular accident) (HCC) Active Problems:   HLD (hyperlipidemia)   HTN (hypertension)   BPH (benign prostatic hyperplasia)   Dysphagia   Dementia with behavioral disturbance (HCC)   Agitation  Assessment and Plan:  Acute CVA Presented with AMS and dysarthria.  Continue Eliquis and Lipitor.  PT OT recommended skilled nursing facility placement.  Disoriented and confused.   HTN Continue Coreg.  Hydralazine has been discontinued.  Titrate GDMT as possible   Dysphagia Underwent MBS on 2/11 with significantly improved swallow function associated with improved cognition. Diet has been advanced to dysphagia 2 with thin liquids   Dementia On Seroquel.  Seen by psychiatry in consultation.  Feels like current dosing of Seroquel is adequate for the patient.  Sitting up by the bedside chair.  Disoriented and confused.  HFrEF Continue Coreg.  Last known ejection fraction of 25% with global hypokinesis.  Continue Imdur.  BPH Continue tamsulosin and finasteride   Stage 3a CKD Continue to monitor.  Disposition. He lived independently with his wife PTA and is not currently safe to return home (his wife also has dementia so DSS is seeking guardianship for them both). Once guardianship is obtained, he will need placement. DSS is also working on LTC Medicaid for him  DVT prophylaxis:  apixaban (ELIQUIS) tablet 5 mg   Disposition: Awaiting  for placement.  Unsafe disposition so far  Status is: Inpatient  Remains inpatient appropriate because: Awaiting for placement, TOC involved.    Code Status:     Code Status: Full Code  Family Communication: spoke with the patient' neighbor on the phone who updated patient's wife at bedside on 07/12/2023.  Consultants: Palliative care Cardiology Neurology Psychiatry.  Procedures: Modified barium swallow  Anti-infectives:  None currently  Anti-infectives (From admission, onward)    None       Subjective: Today, patient was seen and examined at bedside.  Ambulating in the hallway.  Communicative but confused and disoriented.  Appears to be calm  Objective: Vitals:   07/13/23 0821 07/13/23 0847  BP: 127/62 (!) 123/54  Pulse: 96 87  Resp: 17 18  Temp: (!) 97.3 F (36.3 C) 98.1 F (36.7 C)  SpO2:  96%    Intake/Output Summary (Last 24 hours) at 07/13/2023 1026 Last data filed at 07/13/2023 0800 Gross per 24 hour  Intake 1800 ml  Output 2850 ml  Net -1050 ml   Filed Weights   06/21/23 0000  Weight: 60 kg   Body mass index is 22.71 kg/m.   Physical Exam:  GENERAL: Patient is alert awake and Communicative chronically ill and deconditioned.  Has significant dementia with confusion and disorientation.   HENT: No scleral pallor or icterus. Pupils equally reactive to light. Oral mucosa is moist NECK: is supple, no gross swelling noted. CHEST: Clear to auscultation. No crackles or wheezes.   CVS: S1 and S2 heard, no murmur. Regular rate and rhythm.  ABDOMEN: Soft, non-tender,  bowel sounds are present. EXTREMITIES: No edema. CNS: Cranial nerves are intact.  Moves all extremities. SKIN: warm and dry without rashes.  Data Review: I have personally reviewed the following laboratory data and studies,  CBC: Recent Labs  Lab 07/09/23 0543 07/13/23 0632  WBC 12.3* 9.1  NEUTROABS 8.0*  --   HGB 11.8* 11.4*  HCT 37.2* 35.6*  MCV 87.5 87.5  PLT 366 337    Basic Metabolic Panel: Recent Labs  Lab 07/09/23 0543 07/13/23 0632  NA 139 138  K 4.5 4.3  CL 104 105  CO2 27 27  GLUCOSE 91 93  BUN 27* 26*  CREATININE 1.37* 1.35*  CALCIUM 9.8 9.8   Liver Function Tests: No results for input(s): "AST", "ALT", "ALKPHOS", "BILITOT", "PROT", "ALBUMIN" in the last 168 hours. No results for input(s): "LIPASE", "AMYLASE" in the last 168 hours. No results for input(s): "AMMONIA" in the last 168 hours. Cardiac Enzymes: No results for input(s): "CKTOTAL", "CKMB", "CKMBINDEX", "TROPONINI" in the last 168 hours. BNP (last 3 results) Recent Labs    03/01/23 0245 03/03/23 0313 06/22/23 1251  BNP 1,535.3* 489.2* 946.6*    ProBNP (last 3 results) No results for input(s): "PROBNP" in the last 8760 hours.  CBG: No results for input(s): "GLUCAP" in the last 168 hours. No results found for this or any previous visit (from the past 240 hours).   Studies: No results found.    Joycelyn Das, MD  [facility] 07/13/2023  If 7PM-7AM, please contact night-coverage

## 2023-07-14 DIAGNOSIS — I639 Cerebral infarction, unspecified: Secondary | ICD-10-CM | POA: Diagnosis not present
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
## 2023-07-14 NOTE — Plan of Care (Signed)

## 2023-07-14 NOTE — Plan of Care (Signed)
  Problem: Clinical Measurements: Goal: Ability to maintain clinical measurements within normal limits will improve Outcome: Progressing   Problem: Activity: Goal: Risk for activity intolerance will decrease Outcome: Progressing   Problem: Nutrition: Goal: Adequate nutrition will be maintained Outcome: Progressing   Problem: Elimination: Goal: Will not experience complications related to bowel motility Outcome: Progressing Goal: Will not experience complications related to urinary retention Outcome: Progressing   Problem: Safety: Goal: Ability to remain free from injury will improve Outcome: Progressing

## 2023-07-14 NOTE — Progress Notes (Signed)
 Mobility Specialist Progress Note:    07/14/23 1238  Mobility  Activity Ambulated with assistance in hallway  Level of Assistance Contact guard assist, steadying assist  Assistive Device None  Distance Ambulated (ft) 200 ft  Activity Response Tolerated well  Mobility Referral Yes  Mobility visit 1 Mobility  Mobility Specialist Start Time (ACUTE ONLY) 1200  Mobility Specialist Stop Time (ACUTE ONLY) 1212  Mobility Specialist Time Calculation (min) (ACUTE ONLY) 12 min   Received pt in chair having no complaints and agreeable to mobility. Pt was asymptomatic throughout ambulation and returned to room w/o fault. Left in chair w/ call bell in reach and all needs met. Chair alarm on.   Thompson Grayer Mobility Specialist  Please contact vis Secure Chat or  Rehab Office [phone]

## 2023-07-14 NOTE — Progress Notes (Signed)
 PROGRESS NOTE  Dennis [name] XBJ:[id number] DOB: 1946/04/13 DOA: 06/21/2023 PCP: Pcp, No   LOS: 22 days   Brief narrative:  78 years old male with past medical history of hyperlipidemia, BPH, HTN, dementia, RA, COPD, OSA not on CPAP, and CVA who presented to the hospital on 2/1 with mental status and dysarthria.  He was found to have an acute infarct in the L posterior subinsular white matter on MRI.  He and his significant other both have dementia, awaiting guardianship from social services.    Assessment/Plan: Principal Problem:   Acute CVA (cerebrovascular accident) (HCC) Active Problems:   HLD (hyperlipidemia)   HTN (hypertension)   BPH (benign prostatic hyperplasia)   Dysphagia   Dementia with behavioral disturbance (HCC)   Agitation  Assessment and Plan:  Acute CVA Presented with AMS and dysarthria.  Continue Eliquis and Lipitor.  PT OT recommended skilled nursing facility placement.  Disoriented and confused.   HTN Continue Coreg.  Hydralazine has been discontinued.    Dysphagia Underwent MBS on 2/11 with significantly improved swallow function associated with improved cognition. Diet has been advanced to dysphagia 2 with thin liquids   Dementia On Seroquel.  Seen by psychiatry in consultation.  Feels like current dosing of Seroquel is adequate for the patient.  Stable HFrEF Continue Coreg.  Last known ejection fraction of 25% with global hypokinesis.  Continue Imdur.  BPH Continue tamsulosin and finasteride   Stage 3a CKD Continue to monitor.  Disposition. He lived independently with his wife PTA and is not currently safe to return home (his wife also has dementia so DSS is seeking guardianship for them both). Once guardianship is obtained, he will need placement. DSS is also working on LTC Medicaid for him  DVT prophylaxis:  apixaban (ELIQUIS) tablet 5 mg   Disposition: Awaiting for placement.  Unsafe disposition so far  Status is: Inpatient  Remains  inpatient appropriate because: Awaiting for placement, TOC involved.    Code Status:     Code Status: Full Code  Family Communication: spoke with the patient' neighbor on the phone who updated patient's wife at bedside on 07/12/2023.  Consultants: Palliative care Cardiology Neurology Psychiatry.  Procedures: Modified barium swallow  Anti-infectives:  None currently  Anti-infectives (From admission, onward)    None       Subjective: Today, patient was seen and examined at bedside.  Ambulating in the hallway.  Denies any pain, nausea, vomiting, fever, chills.    Objective: Vitals:   07/14/23 0853 07/14/23 0918  BP: 112/76 (!) 154/70  Pulse: 62 93  Resp:  17  Temp:  97.6 F (36.4 C)  SpO2:  96%    Intake/Output Summary (Last 24 hours) at 07/14/2023 1138 Last data filed at 07/14/2023 1100 Gross per 24 hour  Intake 1320 ml  Output 3050 ml  Net -1730 ml   Filed Weights   06/21/23 0000  Weight: 60 kg   Body mass index is 22.71 kg/m.   Physical Exam:  GENERAL: Patient is alert awake and Communicative chronically ill and deconditioned.  Has significant dementia with confusion and disorientation.   HENT: No scleral pallor or icterus. Pupils equally reactive to light. Oral mucosa is moist NECK: is supple, no gross swelling noted. CHEST: Clear to auscultation. No crackles or wheezes.   CVS: S1 and S2 heard, no murmur. Regular rate and rhythm.  ABDOMEN: Soft, non-tender, bowel sounds are present. EXTREMITIES: No edema. CNS: Cranial nerves are intact.  Moves all extremities. SKIN: warm and  dry without rashes.  Data Review: I have personally reviewed the following laboratory data and studies,  CBC: Recent Labs  Lab 07/09/23 0543 07/13/23 0632  WBC 12.3* 9.1  NEUTROABS 8.0*  --   HGB 11.8* 11.4*  HCT 37.2* 35.6*  MCV 87.5 87.5  PLT 366 337   Basic Metabolic Panel: Recent Labs  Lab 07/09/23 0543 07/13/23 0632  NA 139 138  K 4.5 4.3  CL 104 105   CO2 27 27  GLUCOSE 91 93  BUN 27* 26*  CREATININE 1.37* 1.35*  CALCIUM 9.8 9.8   Liver Function Tests: No results for input(s): "AST", "ALT", "ALKPHOS", "BILITOT", "PROT", "ALBUMIN" in the last 168 hours. No results for input(s): "LIPASE", "AMYLASE" in the last 168 hours. No results for input(s): "AMMONIA" in the last 168 hours. Cardiac Enzymes: No results for input(s): "CKTOTAL", "CKMB", "CKMBINDEX", "TROPONINI" in the last 168 hours. BNP (last 3 results) Recent Labs    03/01/23 0245 03/03/23 0313 06/22/23 1251  BNP 1,535.3* 489.2* 946.6*    ProBNP (last 3 results) No results for input(s): "PROBNP" in the last 8760 hours.  CBG: No results for input(s): "GLUCAP" in the last 168 hours. No results found for this or any previous visit (from the past 240 hours).   Studies: No results found.    Joycelyn Das, MD  [facility] 07/14/2023  If 7PM-7AM, please contact night-coverage
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
## 2023-07-14 NOTE — TOC Progression Note (Signed)
 Transition of Care [facility]) - Progression Note    Patient Details  Name: Dennis [name] MRN: [id number] Date of Birth: 1945/09/05  [facility] CM/SW Contact  Carley Hammed, LCSW Phone Number: 07/14/2023, 2:55 PM  Clinical Narrative:    CSW continues to follow pt for disposition needs. Pt walking 450 ft, likely requiring a memory care unit vs. A SNF. Pt waiting for Guardianship and Medicaid from DSS in order to pursue either. TOC will continue to follow.    Expected Discharge Plan: Skilled Nursing Facility Barriers to Discharge: Insurance Authorization, SNF Pending bed offer, Continued Medical Work up, Unsafe home situation  Expected Discharge Plan and Services     Post Acute Care Choice: Skilled Nursing Facility Living arrangements for the past 2 months: Single Family Home                                       Social Determinants of Health (SDOH) Interventions SDOH Screenings   Food Insecurity: No Food Insecurity (06/21/2023)  Housing: Low Risk  (06/21/2023)  Transportation Needs: No Transportation Needs (06/21/2023)  Utilities: Not At Risk (06/21/2023)  Social Connections: Moderately Isolated (06/21/2023)  Tobacco Use: Medium Risk (06/21/2023)    Readmission Risk Interventions     No data to display

## 2023-07-15 DIAGNOSIS — I639 Cerebral infarction, unspecified: Secondary | ICD-10-CM | POA: Diagnosis not present

## 2023-07-15 NOTE — Progress Notes (Signed)
 Mobility Specialist Progress Note:   07/15/23 1102  Mobility  Activity Ambulated with assistance in hallway  Level of Assistance Standby assist, set-up cues, supervision of patient - no hands on  Assistive Device Cane;None  Distance Ambulated (ft) 450 ft  Activity Response Tolerated well  Mobility Referral Yes  Mobility visit 1 Mobility  Mobility Specialist Start Time (ACUTE ONLY) 1038  Mobility Specialist Stop Time (ACUTE ONLY) 1058  Mobility Specialist Time Calculation (min) (ACUTE ONLY) 20 min   Pt received in bed, agreeable to mobility. No complaints during ambulation, asx throughout. Pt requesting to use BR for BM when returning to room. BM successful. Pt performed pericare independently. Pt returned to back to bed with call bell in reach and all needs met. Bed alarm on.   Dennis [name]  Mobility Specialist Please contact via SecureChat Rehab office at [phone]

## 2023-07-15 NOTE — Progress Notes (Signed)
 PROGRESS NOTE  Dennis [name] GEX:[id number] DOB: 03-Mar-1946 DOA: 06/21/2023 PCP: Pcp, No   LOS: 23 days   Brief narrative:  78 years old male with past medical history of hyperlipidemia, BPH, HTN, dementia, RA, COPD, OSA not on CPAP, and CVA who presented to the hospital on 2/1 with mental status and dysarthria.  He was found to have an acute infarct in the L posterior subinsular white matter on MRI.  He and his significant other both have dementia, awaiting guardianship from social services.    Assessment/Plan: Principal Problem:   Acute CVA (cerebrovascular accident) (HCC) Active Problems:   HLD (hyperlipidemia)   HTN (hypertension)   BPH (benign prostatic hyperplasia)   Dysphagia   Dementia with behavioral disturbance (HCC)   Agitation  Assessment and Plan:  Acute CVA Presented with AMS and dysarthria.  Continue Eliquis and Lipitor.  PT OT recommended skilled nursing facility placement.  Communicative but disoriented.   HTN Continue Coreg.  Hydralazine has been discontinued.    Dysphagia Underwent MBS on 2/11 with significantly improved swallow function associated with improved cognition. Diet has been advanced to dysphagia 2 with thin liquids   Dementia On Seroquel.  Seen by psychiatry in consultation.  Feels like current dosing of Seroquel is adequate for the patient.  Stable HFrEF Continue Coreg.  Last known ejection fraction of 25% with global hypokinesis.  Continue Imdur.  BPH Continue tamsulosin and finasteride   Stage 3a CKD Continue to monitor.  Disposition. He lived independently with his wife PTA and is not currently safe to return home (his wife also has dementia so DSS is seeking guardianship for them both). Once guardianship is obtained, he will need placement. DSS is also working on LTC Medicaid for him  DVT prophylaxis:  apixaban (ELIQUIS) tablet 5 mg   Disposition: Awaiting for placement.  Unsafe disposition so far  Status is: Inpatient  Remains  inpatient appropriate because: Awaiting for placement, TOC involved.    Code Status:     Code Status: Full Code  Family Communication: spoke with the patient' neighbor on the phone who updated patient's wife at bedside on 07/12/2023.  Consultants: Palliative care Cardiology Neurology Psychiatry.  Procedures: Modified barium swallow  Anti-infectives:  None currently  Anti-infectives (From admission, onward)    None       Subjective: Today, patient was seen and examined at bedside.  Alert awake and Communicative.  Has underlying dementia.  Disoriented.  Confused at times, denies any pain, nausea, vomiting, shortness of breath.  Objective: Vitals:   07/15/23 0506 07/15/23 0931  BP: (!) 156/70 137/70  Pulse: 82 79  Resp: 16 17  Temp: (!) 97 F (36.1 C) 98.3 F (36.8 C)  SpO2: 97% 95%    Intake/Output Summary (Last 24 hours) at 07/15/2023 1033 Last data filed at 07/15/2023 0930 Gross per 24 hour  Intake 960 ml  Output 2600 ml  Net -1640 ml   Filed Weights   06/21/23 0000  Weight: 60 kg   Body mass index is 22.71 kg/m.   Physical Exam:  GENERAL: Patient is alert awake and Communicative chronically ill and deconditioned.  Underlying dementia.   HENT: No scleral pallor or icterus. Pupils equally reactive to light. Oral mucosa is moist NECK: is supple, no gross swelling noted. CHEST: Clear to auscultation. No crackles or wheezes.   CVS: S1 and S2 heard, no murmur. Regular rate and rhythm.  ABDOMEN: Soft, non-tender, bowel sounds are present. EXTREMITIES: No edema. CNS: Cranial nerves are intact.  Moves all extremities.  Mild disorientation and confusion at times.  Cognitive impairment. SKIN: warm and dry without rashes.  Data Review: I have personally reviewed the following laboratory data and studies,  CBC: Recent Labs  Lab 07/09/23 0543 07/13/23 0632  WBC 12.3* 9.1  NEUTROABS 8.0*  --   HGB 11.8* 11.4*  HCT 37.2* 35.6*  MCV 87.5 87.5  PLT 366 337    Basic Metabolic Panel: Recent Labs  Lab 07/09/23 0543 07/13/23 0632  NA 139 138  K 4.5 4.3  CL 104 105  CO2 27 27  GLUCOSE 91 93  BUN 27* 26*  CREATININE 1.37* 1.35*  CALCIUM 9.8 9.8   Liver Function Tests: No results for input(s): "AST", "ALT", "ALKPHOS", "BILITOT", "PROT", "ALBUMIN" in the last 168 hours. No results for input(s): "LIPASE", "AMYLASE" in the last 168 hours. No results for input(s): "AMMONIA" in the last 168 hours. Cardiac Enzymes: No results for input(s): "CKTOTAL", "CKMB", "CKMBINDEX", "TROPONINI" in the last 168 hours. BNP (last 3 results) Recent Labs    03/01/23 0245 03/03/23 0313 06/22/23 1251  BNP 1,535.3* 489.2* 946.6*    ProBNP (last 3 results) No results for input(s): "PROBNP" in the last 8760 hours.  CBG: No results for input(s): "GLUCAP" in the last 168 hours. No results found for this or any previous visit (from the past 240 hours).   Studies: No results found.    Joycelyn Das, MD  [facility] 07/15/2023  If 7PM-7AM, please contact night-coverage

## 2023-07-15 NOTE — Progress Notes (Signed)
 Occupational Therapy Treatment Patient Details Name: Dennis [name] MRN: [id number] DOB: [birth year] Today's Date: 07/15/2023   History of present illness The pt is a 78 yo male presenting 2/1 from home with concern for stroke (slurred speech and mild L facial droop). MRI showed:  small area of acute infarct in the more superior left frontal cortex and 2 areas of subacute ischemia in superior R frontal lobe. Pt with overnight agitation 2/18 and at high risk of delirium per psychiatry consult 07/10/23. PMH includes: dementia, arthritis, HTN, HLD, RA, COPD, OSA, and CVA.   OT comments  Today pt followed one-step commands with increased time inconsistently, and was not able to follow any two step commands. Pt was mod I for supine to sit bed mobility with HOB elevated and bed rails. Ambulated to bathroom with cane and sup for safety. Grooming and oral hygiene completed at sink with supervision for safety, and cues for task initiation and perseveration. Toilet transfer simulated in room with sup for safety. Pt able to don and doff socks seated EOB with cues for safety, no loss of balance. Recommended d/c to memory care d/t baseline cog status, pt requires supervision for safety. OT will continue to follow acutely to maximize functional independence.      If plan is discharge home, recommend the following:  A little help with walking and/or transfers;A little help with bathing/dressing/bathroom;Assistance with cooking/housework;Direct supervision/assist for medications management;Direct supervision/assist for financial management;Assist for transportation;Supervision due to cognitive status   Equipment Recommendations  Other (comment) (Defer to next site)    Recommendations for Other Services      Precautions / Restrictions Precautions Precautions: Fall Recall of Precautions/Restrictions: Impaired Precaution/Restrictions Comments: poor awareness of situation Restrictions Weight Bearing Restrictions  Per Provider Order: No       Mobility Bed Mobility Overal bed mobility: Needs Assistance Bed Mobility: Supine to Sit, Sit to Supine     Supine to sit: Supervision, HOB elevated, Used rails Sit to supine: Supervision, HOB elevated, Used rails   General bed mobility comments: Cues for safety and following commands    Transfers Overall transfer level: Needs assistance Equipment used: Straight cane Transfers: Sit to/from Stand Sit to Stand: Supervision           General transfer comment: Sup for safety     Balance Overall balance assessment: Needs assistance Sitting-balance support: No upper extremity supported, Feet supported Sitting balance-Leahy Scale: Good     Standing balance support: Single extremity supported, Reliant on assistive device for balance Standing balance-Leahy Scale: Fair Standing balance comment: Pt used sink counter or cane to steady himself during grooming                           ADL either performed or assessed with clinical judgement   ADL Overall ADL's : Needs assistance/impaired Eating/Feeding: Supervision/ safety   Grooming: Supervision/safety;Wash/dry hands;Wash/dry face;Oral care;Brushing hair;Cueing for safety;Standing               Lower Body Dressing: Supervision/safety;Cueing for safety;Sitting/lateral leans   Toilet Transfer: Supervision/safety;Cueing for safety;Ambulation (W/ cane) Toilet Transfer Details (indicate cue type and reason): Simulated in room w/ cane, cues for safety,         Functional mobility during ADLs: Supervision/safety;Cane;Cueing for safety General ADL Comments: Pt able to follow one step commands, baseline cog causes safety concerns    Extremity/Trunk Assessment Upper Extremity Assessment Upper Extremity Assessment: Overall WFL for tasks assessed   Lower Extremity  Assessment Lower Extremity Assessment: Defer to PT evaluation        Vision   Vision Assessment?: No apparent visual  deficits   Perception     Praxis     Communication Communication Communication: Impaired Factors Affecting Communication: Reduced clarity of speech;Hearing impaired   Cognition Arousal: Alert Behavior During Therapy: Impulsive Cognition: History of cognitive impairments             OT - Cognition Comments: Followed one step commands with cueing for task ceasation                 Following commands: Intact Following commands impaired: Follows one step commands inconsistently, Follows one step commands with increased time      Cueing   Cueing Techniques: Verbal cues, Tactile cues  Exercises      Shoulder Instructions       General Comments VSS on RA    Pertinent Vitals/ Pain       Pain Assessment Pain Assessment: No/denies pain  Home Living Family/patient expects to be discharged to:: Private residence Living Arrangements: Spouse/significant other                               Additional Comments: pt unable to provide full history, per chart he lives with a significant other who also has memory issues and is unable toassist, 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      Prior Functioning/Environment              Frequency  Min 1X/week        Progress Toward Goals  OT Goals(current goals can now be found in the care plan section)  Progress towards OT goals: Progressing toward goals  Acute Rehab OT Goals Patient Stated Goal: None OT Goal Formulation: With patient Time For Goal Achievement: 07/21/23 Potential to Achieve Goals: Good ADL Goals Pt Will Perform Grooming: with supervision;standing Pt Will Perform Lower Body Dressing: with supervision;sit to/from stand;sitting/lateral leans Pt Will Transfer to Toilet: ambulating;regular height toilet;with supervision Pt Will Perform Toileting - Clothing Manipulation and hygiene: with supervision;sitting/lateral leans;sit to/from stand Additional ADL Goal #1: Pt will follow 2 step commands  with increased time.  Plan      Co-evaluation                 AM-PAC OT "6 Clicks" Daily Activity     Outcome Measure   Help from another person eating meals?: A Little Help from another person taking care of personal grooming?: A Little Help from another person toileting, which includes using toliet, bedpan, or urinal?: A Little Help from another person bathing (including washing, rinsing, drying)?: A Little Help from another person to put on and taking off regular upper body clothing?: A Little Help from another person to put on and taking off regular lower body clothing?: A Little 6 Click Score: 18    End of Session Equipment Utilized During Treatment: Gait belt;Other (comment) Gilmer Mor)  OT Visit Diagnosis: Other abnormalities of gait and mobility (R26.89);Muscle weakness (generalized) (M62.81);Other symptoms and signs involving cognitive function   Activity Tolerance Patient tolerated treatment well   Patient Left in bed;with call bell/phone within reach;with bed alarm set   Nurse Communication Mobility status        Time: 1610-9604 OT Time Calculation (min): 23 min  Charges: OT General Charges $OT Visit: 1 Visit OT Treatments $Self Care/Home Management : 23-37 mins  Ruford Dudzinski C, OT  Acute  Rehabilitation Services Office [phone] Secure chat preferred   Marilynne Drivers 07/15/2023, 12:51 PM

## 2023-07-15 NOTE — Plan of Care (Signed)

## 2023-07-16 DIAGNOSIS — I639 Cerebral infarction, unspecified: Secondary | ICD-10-CM | POA: Diagnosis not present

## 2023-07-16 NOTE — Progress Notes (Signed)
 Pt is sundowning, wondering in the hall looking for his wife, this nurse was able to redirect pt back into his room and gave pt coffee and graham crackers. Endorse to the ongoing nurse that pt is siting up in the chair and eating his snacks.

## 2023-07-16 NOTE — Progress Notes (Signed)
 PROGRESS NOTE  Dennis [name] VHQ:[id number] DOB: June 25, 1945 DOA: 06/21/2023 PCP: Pcp, No   LOS: 24 days   Brief narrative:  78 years old male with past medical history of hyperlipidemia, BPH, HTN, dementia, RA, COPD, OSA not on CPAP, and CVA who presented to the hospital on 2/1 with mental status and dysarthria.  He was found to have an acute infarct in the L posterior subinsular white matter on MRI.  He and his significant other both have dementia, awaiting guardianship from social services.    Assessment/Plan: Principal Problem:   Acute CVA (cerebrovascular accident) (HCC) Active Problems:   HLD (hyperlipidemia)   HTN (hypertension)   BPH (benign prostatic hyperplasia)   Dysphagia   Dementia with behavioral disturbance (HCC)   Agitation  Assessment and Plan:  Acute CVA Presented with AMS and dysarthria.  Continue Eliquis and Lipitor.  PT OT recommended skilled nursing facility placement.  Communicative but disoriented.    HTN Continue Coreg.  Blood pressure is stable at this time.   Dysphagia Underwent MBS on 2/11 with significantly improved swallow function associated with improved cognition.  Diet has been advanced to regular diet at this time.    Dementia On Seroquel.  Seen by psychiatry in consultation.  Feels like current dosing of Seroquel is adequate for the patient.  Stable  HFrEF Continue Coreg.  Last known ejection fraction of 25% with global hypokinesis.  Continue Imdur.  Appears compensated at this time.  On room air.  No gross edema.  BPH Continue tamsulosin and finasteride   Stage 3a CKD Continue to monitor.  Disposition. He lived independently with his wife PTA and is not currently safe to return home (his wife also has dementia so DSS is seeking guardianship for them both). Once guardianship is obtained, he will need placement. DSS is also working on LTC Medicaid for him  DVT prophylaxis:  apixaban (ELIQUIS) tablet 5 mg   Disposition: Awaiting for  placement.  Unsafe disposition so far  Status is: Inpatient  Remains inpatient appropriate because: Awaiting for placement, TOC involved.    Code Status:     Code Status: Full Code  Family Communication: spoke with the patient' neighbor on the phone who updated patient's wife at bedside on 07/12/2023.  Consultants: Palliative care Cardiology Neurology Psychiatry.  Procedures: Modified barium swallow  Anti-infectives:  None currently  Anti-infectives (From admission, onward)    None       Subjective: Today, patient was seen and examined at bedside.  Alert awake and Communicative.  Has underlying dementia so disoriented and confused at times.  Denies any pain, nausea, vomiting, chest pain or shortness of breath.   Objective: Vitals:   07/16/23 0604 07/16/23 0807  BP: (!) 159/62 115/64  Pulse: 72 77  Resp: 18 16  Temp: 98.2 F (36.8 C) 98.2 F (36.8 C)  SpO2: 96% 98%    Intake/Output Summary (Last 24 hours) at 07/16/2023 1042 Last data filed at 07/15/2023 1837 Gross per 24 hour  Intake 600 ml  Output 450 ml  Net 150 ml   Filed Weights   06/21/23 0000  Weight: 60 kg   Body mass index is 22.71 kg/m.   Physical Exam:  GENERAL: Patient is alert awake and Communicative, chronically ill and deconditioned.  Underlying dementia.  Disoriented at this time HENT: No scleral pallor or icterus. Pupils equally reactive to light. Oral mucosa is moist NECK: is supple, no gross swelling noted. CHEST: Clear to auscultation. No crackles or wheezes.  CVS: S1 and S2 heard, no murmur. Regular rate and rhythm.  ABDOMEN: Soft, non-tender, bowel sounds are present. EXTREMITIES: No edema. CNS: Cranial nerves are intact.  Moves all extremities.  Cognitive impairment. SKIN: warm and dry without rashes.  Data Review: I have personally reviewed the following laboratory data and studies,  CBC: Recent Labs  Lab 07/13/23 0632  WBC 9.1  HGB 11.4*  HCT 35.6*  MCV 87.5  PLT  337   Basic Metabolic Panel: Recent Labs  Lab 07/13/23 0632  NA 138  K 4.3  CL 105  CO2 27  GLUCOSE 93  BUN 26*  CREATININE 1.35*  CALCIUM 9.8   Liver Function Tests: No results for input(s): "AST", "ALT", "ALKPHOS", "BILITOT", "PROT", "ALBUMIN" in the last 168 hours. No results for input(s): "LIPASE", "AMYLASE" in the last 168 hours. No results for input(s): "AMMONIA" in the last 168 hours. Cardiac Enzymes: No results for input(s): "CKTOTAL", "CKMB", "CKMBINDEX", "TROPONINI" in the last 168 hours. BNP (last 3 results) Recent Labs    03/01/23 0245 03/03/23 0313 06/22/23 1251  BNP 1,535.3* 489.2* 946.6*    ProBNP (last 3 results) No results for input(s): "PROBNP" in the last 8760 hours.  CBG: No results for input(s): "GLUCAP" in the last 168 hours. No results found for this or any previous visit (from the past 240 hours).   Studies: No results found.    Joycelyn Das, MD  [facility] 07/16/2023  If 7PM-7AM, please contact night-coverage

## 2023-07-16 NOTE — Plan of Care (Signed)
  Problem: Health Behavior/Discharge Planning: Goal: Ability to manage health-related needs will improve Outcome: Not Progressing   Problem: Clinical Measurements: Goal: Will remain free from infection Outcome: Progressing Goal: Diagnostic test results will improve Outcome: Progressing Goal: Respiratory complications will improve Outcome: Progressing Goal: Cardiovascular complication will be avoided Outcome: Progressing   Problem: Nutrition: Goal: Adequate nutrition will be maintained Outcome: Progressing   Problem: Coping: Goal: Level of anxiety will decrease Outcome: Not Progressing   Problem: Elimination: Goal: Will not experience complications related to urinary retention Outcome: Progressing   Problem: Safety: Goal: Ability to remain free from injury will improve Outcome: Progressing

## 2023-07-17 DIAGNOSIS — I639 Cerebral infarction, unspecified: Secondary | ICD-10-CM | POA: Diagnosis not present

## 2023-07-17 NOTE — Progress Notes (Signed)
 Mobility Specialist Progress Note:   07/17/23 1201  Mobility  Activity Ambulated independently in hallway  Level of Assistance Modified independent, requires aide device or extra time  Assistive Device Cane  Distance Ambulated (ft) 550 ft  Activity Response Tolerated well  Mobility Referral Yes  Mobility visit 1 Mobility  Mobility Specialist Start Time (ACUTE ONLY) 1115  Mobility Specialist Stop Time (ACUTE ONLY) 1125  Mobility Specialist Time Calculation (min) (ACUTE ONLY) 10 min   Pt received in chair, pleasant and agreeable to mobility. No complaints stated during ambulation, asx throughout. Pt returned to chair with call bell in reach and all needs met. Chair alarm on.   [name]  Mobility Specialist Please contact via SecureChat Rehab office at [phone]

## 2023-07-17 NOTE — Progress Notes (Signed)
 Occupational Therapy Treatment Patient Details Name: Dennis [name] MRN: [id number] DOB: [birth year] Today's Date: 07/17/2023   History of present illness The pt is a 78 yo male presenting 2/1 from home with concern for stroke (slurred speech and mild L facial droop). MRI showed:  small area of acute infarct in the more superior left frontal cortex and 2 areas of subacute ischemia in superior R frontal lobe. Pt with overnight agitation 2/18 and at high risk of delirium per psychiatry consult 07/10/23. PMH includes: dementia, arthritis, HTN, HLD, RA, COPD, OSA, and CVA.   OT comments  Upon entry pt was standing in room, no chair alarm activated. Pt was oriented towards self, aware he was in hospital, but thought he was in [facility], and unaware of why he was in the hospital. Grooming completed at the sink with s for safety. Unaware to recall two step commands to wash his hands then have a seat. Functional ambulation completed to increase divided attention. Pt unable to ambulate and recall simple items (fruit, colors, etc) at the same time. Ambulated with the quad cane and experienced no loss of balance. While seated pt was able to verbalize sequencing for familiar simple tasks (make a sandwich, brushing teeth). Recommended d/c to memory care d/t baseline cog status, pt requires s for safety. OT will continue to follow acutely to maximize functional independence.      If plan is discharge home, recommend the following:  A little help with walking and/or transfers;A little help with bathing/dressing/bathroom;Assistance with cooking/housework;Direct supervision/assist for medications management;Direct supervision/assist for financial management;Assist for transportation;Supervision due to cognitive status   Equipment Recommendations  Other (comment) (Defer to next site)    Recommendations for Other Services      Precautions / Restrictions Precautions Precautions: Fall Recall of  Precautions/Restrictions: Impaired Precaution/Restrictions Comments: poor awareness of situation Restrictions Weight Bearing Restrictions Per Provider Order: No       Mobility Bed Mobility               General bed mobility comments: Pt standing in room upon OT entry    Transfers Overall transfer level: Needs assistance Equipment used: Quad cane Transfers: Sit to/from Stand Sit to Stand: Supervision           General transfer comment: S for safety, pt impulsive     Balance Overall balance assessment: Needs assistance Sitting-balance support: No upper extremity supported, Feet supported Sitting balance-Leahy Scale: Good     Standing balance support: Single extremity supported, During functional activity, Reliant on assistive device for balance Standing balance-Leahy Scale: Fair Standing balance comment: Pt used sink counter or cane to steady himself during grooming                           ADL either performed or assessed with clinical judgement   ADL Overall ADL's : Needs assistance/impaired     Grooming: Wash/dry hands;Wash/dry face;Supervision/safety;Cueing for safety;Standing Grooming Details (indicate cue type and reason): Cues for perseveration                 Toilet Transfer: Supervision/safety;Ambulation Toilet Transfer Details (indicate cue type and reason): Simulated in room w/ quad cane         Functional mobility during ADLs: Supervision/safety;Cane;Cueing for safety General ADL Comments: Pt able to follow one step commands inconsistently, cues for task iniation and perseveration, baseline cog is safety concerns    Extremity/Trunk Assessment Upper Extremity Assessment Upper Extremity Assessment: Overall  WFL for tasks assessed   Lower Extremity Assessment Lower Extremity Assessment: Defer to PT evaluation        Vision   Vision Assessment?: No apparent visual deficits   Perception     Praxis     Communication  Communication Communication: Impaired Factors Affecting Communication: Reduced clarity of speech;Hearing impaired   Cognition Arousal: Alert Behavior During Therapy: Impulsive Cognition: History of cognitive impairments             OT - Cognition Comments: Followed one step commands, with increased time and cues for perseveration. Only oriented to self, knows he is in the hospital, but thinks he is in [facility], not aware of why he is in the hospital                 Following commands: Intact Following commands impaired: Follows one step commands inconsistently, Follows one step commands with increased time      Cueing   Cueing Techniques: Verbal cues, Tactile cues, Gestural cues  Exercises      Shoulder Instructions       General Comments Upon OT entry pt standing and ambulating in room, chair alarm was turned off    Pertinent Vitals/ Pain       Pain Assessment Pain Assessment: No/denies pain  Home Living                                          Prior Functioning/Environment              Frequency  Min 1X/week        Progress Toward Goals  OT Goals(current goals can now be found in the care plan section)  Progress towards OT goals: Progressing toward goals  Acute Rehab OT Goals Patient Stated Goal: None OT Goal Formulation: With patient Time For Goal Achievement: 07/21/23 Potential to Achieve Goals: Good ADL Goals Pt Will Perform Grooming: with supervision;standing Pt Will Perform Lower Body Dressing: with supervision;sit to/from stand;sitting/lateral leans Pt Will Transfer to Toilet: ambulating;regular height toilet;with supervision Pt Will Perform Toileting - Clothing Manipulation and hygiene: with supervision;sitting/lateral leans;sit to/from stand Additional ADL Goal #1: Pt will follow 2 step commands with increased time.  Plan      Co-evaluation                 AM-PAC OT "6 Clicks" Daily Activity      Outcome Measure   Help from another person eating meals?: A Little Help from another person taking care of personal grooming?: A Little Help from another person toileting, which includes using toliet, bedpan, or urinal?: A Little Help from another person bathing (including washing, rinsing, drying)?: A Little Help from another person to put on and taking off regular upper body clothing?: A Little Help from another person to put on and taking off regular lower body clothing?: A Little 6 Click Score: 18    End of Session Equipment Utilized During Treatment: Gait belt;Other (comment) (Quad cane)  OT Visit Diagnosis: Other abnormalities of gait and mobility (R26.89);Muscle weakness (generalized) (M62.81);Other symptoms and signs involving cognitive function   Activity Tolerance Patient tolerated treatment well   Patient Left in chair;with chair alarm set;with nursing/sitter in room;with call bell/phone within reach   Nurse Communication Mobility status        Time: 1610-9604 OT Time Calculation (min): 25 min  Charges: OT General Charges $  OT Visit: 1 Visit OT Treatments $Self Care/Home Management : 23-37 mins  Ivor Messier, OT  Acute Rehabilitation Services Office [phone] Secure chat preferred   Marilynne Drivers 07/17/2023, 3:47 PM

## 2023-07-17 NOTE — Progress Notes (Signed)
 Speech Language Pathology Treatment: Cognitive-Linquistic  Patient Details Name: Dennis [name] MRN: [id number] DOB: [birth year] Today's Date: 07/17/2023 Time: 0865-7846 SLP Time Calculation (min) (ACUTE ONLY): 20 min  Assessment / Plan / Recommendation Clinical Impression  Pt walking around room, concerned about his wife.  Speech/language have improved significantly, but it reveals level of confusion.  He is communicating fluently.  Basic naming and following simple commands have improved to 80-90% accuracy.  He tends to perseverate on topics, today worried about not seeing his wife for eight months, thinking that she is out in the hall. He was easily redirectable. Is making basic needs known and engaging in quite functional social communication, but memory and attention are impaired. Will need 24 hour supervision. Awaiting guardianship.  SLP will follow.   HPI HPI: Pt is a 79 y.o. male presented to ED after significant other noted him to be acutely more confused than his baseline and having speech difficulty. EMS noted him to have (L) facial droop and expressive aphasia as well as hypertensive en route to ED. MRI (06/21/23) revealed, "Punctate acute infarct in the left posterior subinsular white matter... small area of acute infarct in the more superior left frontal cortex. There also 2 punctate areas of diffusion hyperintensity without ADC correlate in the superior right frontal lobe which may be areas of subacute ischemia". Previous acute SLP services provided for dysphagia on 03/03/23, recommended advancement of diet to dys 3/thin liquids given improvement in mentation and respiratory status. PMH: HLD, BPH, HTN, dementia, rheumatoid arthritis, COPD, Barrett's esophagus, OSA does not use CPAP, history of CVA.      SLP Plan  Continue with current plan of care      Recommendations for follow up therapy are one component of a multi-disciplinary discharge planning process, led by the attending  physician.  Recommendations may be updated based on patient status, additional functional criteria and insurance authorization.    Recommendations                  Oral care BID   Frequent or constant Supervision/Assistance Aphasia (R47.01)     Continue with current plan of care    Dennis [name] L. Samson Frederic, MA CCC/SLP Clinical Specialist - Acute Care SLP Acute Rehabilitation Services Office number [phone]  Blenda Mounts Laurice  07/17/2023, 3:12 PM

## 2023-07-17 NOTE — Progress Notes (Signed)
 Physical Therapy Treatment Patient Details Name: JAVELL BLACKBURN MRN: [id number] DOB: 1945/11/07 Today's Date: 07/17/2023   History of Present Illness The pt is a 78 yo male presenting 2/1 from home with concern for stroke (slurred speech and mild L facial droop). MRI showed:  small area of acute infarct in the more superior left frontal cortex and 2 areas of subacute ischemia in superior R frontal lobe. Pt with overnight agitation 2/18 and at high risk of delirium per psychiatry consult 07/10/23. PMH includes: dementia, arthritis, HTN, HLD, RA, COPD, OSA, and CVA.    PT Comments  Patient resting in recliner at start of session, pleasant, confused, and agreeable to therapy visit. Pt required cues for safety throughout due to impulsivity and decreased awareness of deficits. Supervision for transfers and gait. Pt amb with [facility]QC initially and transitioned to no AD as pt has difficulty coordinating cane placement. Pt given cognitive dual task challenge during gait and noted to drift Rt/Lt more. Pt unable to state alphabet beyond "F" when attempting, able to count up to 10 and down from 10 with extra time. EOS pt completed seated exercises for functional strengthening, repositioned in recliner, and left with Alarm on and call bell and all needs within reach. Will continue to progress pt as able.   If plan is discharge home, recommend the following: Supervision due to cognitive status;Direct supervision/assist for financial management;Direct supervision/assist for medications management   Can travel by private vehicle     Yes  Equipment Recommendations  None recommended by PT    Recommendations for Other Services       Precautions / Restrictions Precautions Precautions: Fall Recall of Precautions/Restrictions: Impaired Precaution/Restrictions Comments: poor awareness of situation Restrictions Weight Bearing Restrictions Per Provider Order: No     Mobility  Bed Mobility                General bed mobility comments: pt OOB in recliner    Transfers Overall transfer level: Needs assistance Equipment used: Quad cane Transfers: Sit to/from Stand Sit to Stand: Supervision           General transfer comment: sup and cues for safety. pt impulsive.    Ambulation/Gait Ambulation/Gait assistance: Supervision Gait Distance (Feet): 400 Feet Assistive device: None Gait Pattern/deviations: Step-through pattern, Drifts right/left Gait velocity: functional     General Gait Details: pt ambulating with [facility] for portion of gait, pt inconsistent with cane placement and carrying it at times. Steady with no AD. Pt less steady with dual task challenge. Counting 1-10, 10-1, and saying alphabet. Pt unable to get past "F".   Stairs             Wheelchair Mobility     Tilt Bed    Modified Rankin (Stroke Patients Only)       Balance Overall balance assessment: Needs assistance Sitting-balance support: No upper extremity supported, Feet supported Sitting balance-Leahy Scale: Good     Standing balance support: Single extremity supported, During functional activity, Reliant on assistive device for balance Standing balance-Leahy Scale: Fair Standing balance comment: Pt used sink counter or cane to steady himself during grooming                            Communication Communication Communication: Impaired Factors Affecting Communication: Reduced clarity of speech;Hearing impaired  Cognition Arousal: Alert Behavior During Therapy: Impulsive   PT - Cognitive impairments: History of cognitive impairments  Following commands: Intact Following commands impaired: Follows one step commands inconsistently, Follows one step commands with increased time    Cueing Cueing Techniques: Verbal cues, Tactile cues, Gestural cues  Exercises Other Exercises Other Exercises: 10x sit<>stand no UE Other Exercises: 10x LAQ bil LE     General Comments General comments (skin integrity, edema, etc.): Upon OT entry pt standing and ambulating in room, chair alarm was turned off      Pertinent Vitals/Pain Pain Assessment Pain Assessment: No/denies pain    Home Living                          Prior Function            PT Goals (current goals can now be found in the care plan section) Acute Rehab PT Goals PT Goal Formulation: With patient Time For Goal Achievement: 07/18/23 Potential to Achieve Goals: Good Progress towards PT goals: Progressing toward goals    Frequency    Min 1X/week      PT Plan      Co-evaluation              AM-PAC PT "6 Clicks" Mobility   Outcome Measure  Help needed turning from your back to your side while in a flat bed without using bedrails?: None Help needed moving from lying on your back to sitting on the side of a flat bed without using bedrails?: None Help needed moving to and from a bed to a chair (including a wheelchair)?: A Little Help needed standing up from a chair using your arms (e.g., wheelchair or bedside chair)?: A Little Help needed to walk in hospital room?: A Little Help needed climbing 3-5 steps with a railing? : A Little 6 Click Score: 20    End of Session Equipment Utilized During Treatment: Gait belt Activity Tolerance: Patient tolerated treatment well Patient left: in chair;with chair alarm set;with call bell/phone within reach Nurse Communication: Mobility status PT Visit Diagnosis: Unsteadiness on feet (R26.81);Muscle weakness (generalized) (M62.81);Other abnormalities of gait and mobility (R26.89);Difficulty in walking, not elsewhere classified (R26.2);Pain Pain - Right/Left: Right Pain - part of body: Knee     Time: 1478-2956 PT Time Calculation (min) (ACUTE ONLY): 21 min  Charges:    $Gait Training: 8-22 mins PT General Charges $$ ACUTE PT VISIT: 1 Visit                     Wynn Maudlin, DPT Acute Rehabilitation  Services Office [phone]  07/17/23 4:22 PM

## 2023-07-17 NOTE — Progress Notes (Signed)
 PROGRESS NOTE  Dennis [name] MVH:[id number] DOB: 02-Mar-1946 DOA: 06/21/2023 PCP: Pcp, No   LOS: 25 days   Brief narrative:  78 years old male with past medical history of hyperlipidemia, BPH, HTN, dementia, RA, COPD, OSA not on CPAP, and CVA who presented to the hospital on 2/1 with mental status and dysarthria.  He was found to have an acute infarct in the L posterior subinsular white matter on MRI.  He and his significant other both have dementia, awaiting guardianship from social services.  07/17/2023: Patient seen.  No new changes.    Assessment/Plan: Principal Problem:   Acute CVA (cerebrovascular accident) (HCC) Active Problems:   HLD (hyperlipidemia)   HTN (hypertension)   BPH (benign prostatic hyperplasia)   Dysphagia   Dementia with behavioral disturbance (HCC)   Agitation  Assessment and Plan:  Acute CVA Presented with AMS and dysarthria.  Continue Eliquis and Lipitor.  PT OT recommended skilled nursing facility placement.  Communicative but disoriented.    HTN Continue Coreg.  Blood pressure is stable at this time.   Dysphagia Underwent MBS on 2/11 with significantly improved swallow function associated with improved cognition.  Diet has been advanced to regular diet at this time.    Dementia On Seroquel.  Seen by psychiatry in consultation.  Feels like current dosing of Seroquel is adequate for the patient.  Stable  HFrEF Continue Coreg.  Last known ejection fraction of 25% with global hypokinesis.  Continue Imdur.  Appears compensated at this time.  On room air.  No gross edema.  BPH Continue tamsulosin and finasteride   Stage 3a CKD Continue to monitor.  Disposition. He lived independently with his wife PTA and is not currently safe to return home (his wife also has dementia so DSS is seeking guardianship for them both). Once guardianship is obtained, he will need placement. DSS is also working on LTC Medicaid for him  DVT prophylaxis:  apixaban (ELIQUIS)  tablet 5 mg   Disposition: Awaiting for placement.  Unsafe disposition so far  Status is: Inpatient  Remains inpatient appropriate because: Awaiting for placement, TOC involved.    Code Status:     Code Status: Full Code  Family Communication: spoke with the patient' neighbor on the phone who updated patient's wife at bedside on 07/12/2023.  Consultants: Palliative care Cardiology Neurology Psychiatry.  Procedures: Modified barium swallow  Anti-infectives:  None currently  Anti-infectives (From admission, onward)    None       Subjective: No new complaints.  Objective: Vitals:   07/17/23 0610 07/17/23 0912  BP: 124/68 (!) 137/59  Pulse: 80 84  Resp:  17  Temp: 98.6 F (37 C) 97.6 F (36.4 C)  SpO2: 96% 96%    Intake/Output Summary (Last 24 hours) at 07/17/2023 1339 Last data filed at 07/17/2023 1000 Gross per 24 hour  Intake 360 ml  Output --  Net 360 ml   Filed Weights   06/21/23 0000  Weight: 60 kg   Body mass index is 22.71 kg/m.   Physical Exam:  GENERAL: Not in any distress.  Patient is awake and alert. HEENT: Patient is pale.  No jaundice. Neck: Supple. Lungs: Clear to auscultation. CVs: S1-S2. Neuro: Awake and alert.  Moves all extremities. Extremities: No leg edema. Abdomen: Soft and nontender.   Data Review: I have personally reviewed the following laboratory data and studies,  CBC: Recent Labs  Lab 07/13/23 0632  WBC 9.1  HGB 11.4*  HCT 35.6*  MCV 87.5  PLT 337   Basic Metabolic Panel: Recent Labs  Lab 07/13/23 0632  NA 138  K 4.3  CL 105  CO2 27  GLUCOSE 93  BUN 26*  CREATININE 1.35*  CALCIUM 9.8   Liver Function Tests: No results for input(s): "AST", "ALT", "ALKPHOS", "BILITOT", "PROT", "ALBUMIN" in the last 168 hours. No results for input(s): "LIPASE", "AMYLASE" in the last 168 hours. No results for input(s): "AMMONIA" in the last 168 hours. Cardiac Enzymes: No results for input(s): "CKTOTAL", "CKMB",  "CKMBINDEX", "TROPONINI" in the last 168 hours. BNP (last 3 results) Recent Labs    03/01/23 0245 03/03/23 0313 06/22/23 1251  BNP 1,535.3* 489.2* 946.6*    ProBNP (last 3 results) No results for input(s): "PROBNP" in the last 8760 hours.  CBG: No results for input(s): "GLUCAP" in the last 168 hours. No results found for this or any previous visit (from the past 240 hours).   Studies: No results found.  Time spent: 35 minutes.  Barnetta Chapel, MD  [facility] 07/17/2023  If 7PM-7AM, please contact night-coverage

## 2023-07-18 DIAGNOSIS — I639 Cerebral infarction, unspecified: Secondary | ICD-10-CM | POA: Diagnosis not present

## 2023-07-18 NOTE — TOC Progression Note (Signed)
 Transition of Care [facility]) - Progression Note    Patient Details  Name: Dennis [name] MRN: [id number] Date of Birth: Jul 30, 1945  [facility] of [facility] CM/SW Contact  Carley Hammed, LCSW Phone Number: 07/18/2023, 3:10 PM  Clinical Narrative:     CSW continues to follow pt for disposition needs. Pt walking 450 ft, likely requiring a memory care unit vs. A SNF. Pt waiting for Guardianship and Medicaid from DSS in order to pursue either. TOC will continue to follow.   Expected Discharge Plan: Skilled Nursing Facility Barriers to Discharge: Insurance Authorization, SNF Pending bed offer, Continued Medical Work up, Unsafe home situation  Expected Discharge Plan and Services     Post Acute Care Choice: Skilled Nursing Facility Living arrangements for the past 2 months: Single Family Home                                       Social Determinants of Health (SDOH) Interventions SDOH Screenings   Food Insecurity: No Food Insecurity (06/21/2023)  Housing: Low Risk  (06/21/2023)  Transportation Needs: No Transportation Needs (06/21/2023)  Utilities: Not At Risk (06/21/2023)  Social Connections: Moderately Isolated (06/21/2023)  Tobacco Use: Medium Risk (06/21/2023)    Readmission Risk Interventions     No data to display

## 2023-07-18 NOTE — Progress Notes (Signed)
 PROGRESS NOTE  Dennis [name] [id number]:[id number] DOB: January 10, 1946 DOA: 06/21/2023 PCP: Pcp, No   LOS: 26 days   Brief narrative:  78 years old male with past medical history of hyperlipidemia, BPH, HTN, dementia, RA, COPD, OSA not on CPAP, and CVA who presented to the hospital on 2/1 with mental status and dysarthria.  He was found to have an acute infarct in the L posterior subinsular white matter on MRI.  He and his significant other both have dementia, awaiting guardianship from social services.  07/18/2023: Patient seen.  No new changes.    Assessment/Plan: Principal Problem:   Acute CVA (cerebrovascular accident) (HCC) Active Problems:   HLD (hyperlipidemia)   HTN (hypertension)   BPH (benign prostatic hyperplasia)   Dysphagia   Dementia with behavioral disturbance (HCC)   Agitation  Assessment and Plan:  Acute CVA Presented with AMS and dysarthria.  Continue Eliquis and Lipitor.  PT OT recommended skilled nursing facility placement.  Communicative but disoriented.    HTN Continue Coreg.  Blood pressure is stable at this time.   Dysphagia Underwent MBS on 2/11 with significantly improved swallow function associated with improved cognition.  Diet has been advanced to regular diet at this time.    Dementia On Seroquel.  Seen by psychiatry in consultation.  Feels like current dosing of Seroquel is adequate for the patient.  Stable  HFrEF Continue Coreg.  Last known ejection fraction of 25% with global hypokinesis.  Continue Imdur.  Appears compensated at this time.  On room air.  No gross edema.  BPH Continue tamsulosin and finasteride   Stage 3a CKD Continue to monitor.  Disposition. He lived independently with his wife PTA and is not currently safe to return home (his wife also has dementia so DSS is seeking guardianship for them both). Once guardianship is obtained, he will need placement. DSS is also working on LTC Medicaid for him  DVT prophylaxis:  apixaban (ELIQUIS)  tablet 5 mg   Disposition: Awaiting for placement.  Unsafe disposition so far  Status is: Inpatient  Remains inpatient appropriate because: Awaiting for placement, TOC involved.    Code Status:     Code Status: Full Code  Family Communication: spoke with the patient' neighbor on the phone who updated patient's wife at bedside on 07/12/2023.  Consultants: Palliative care Cardiology Neurology Psychiatry.  Procedures: Modified barium swallow  Anti-infectives:  None currently  Anti-infectives (From admission, onward)    None       Subjective: No new complaints.  Objective: Vitals:   07/18/23 0750 07/18/23 1535  BP: (!) 146/88 127/65  Pulse: 82 78  Resp: 18 17  Temp: 98.9 F (37.2 C) 99 F (37.2 C)  SpO2: 95% 98%   No intake or output data in the 24 hours ending 07/18/23 2100  Filed Weights   06/21/23 0000  Weight: 60 kg   Body mass index is 22.71 kg/m.   Physical Exam:  GENERAL: Not in any distress.  Patient is awake and alert. HEENT: Patient is pale.  No jaundice. Neck: Supple. Lungs: Clear to auscultation. CVs: S1-S2. Neuro: Awake and alert.  Moves all extremities. Extremities: No leg edema. Abdomen: Soft and nontender.   Data Review: I have personally reviewed the following laboratory data and studies,  CBC: Recent Labs  Lab 07/13/23 0632  WBC 9.1  HGB 11.4*  HCT 35.6*  MCV 87.5  PLT 337   Basic Metabolic Panel: Recent Labs  Lab 07/13/23 0632  NA 138  K 4.3  CL 105  CO2 27  GLUCOSE 93  BUN 26*  CREATININE 1.35*  CALCIUM 9.8   Liver Function Tests: No results for input(s): "AST", "ALT", "ALKPHOS", "BILITOT", "PROT", "ALBUMIN" in the last 168 hours. No results for input(s): "LIPASE", "AMYLASE" in the last 168 hours. No results for input(s): "AMMONIA" in the last 168 hours. Cardiac Enzymes: No results for input(s): "CKTOTAL", "CKMB", "CKMBINDEX", "TROPONINI" in the last 168 hours. BNP (last 3 results) Recent Labs     03/01/23 0245 03/03/23 0313 06/22/23 1251  BNP 1,535.3* 489.2* 946.6*    ProBNP (last 3 results) No results for input(s): "PROBNP" in the last 8760 hours.  CBG: No results for input(s): "GLUCAP" in the last 168 hours. No results found for this or any previous visit (from the past 240 hours).   Studies: No results found.  Time spent: 35 minutes.  Barnetta Chapel, MD  [facility] 07/18/2023  If 7PM-7AM, please contact night-coverage

## 2023-07-18 NOTE — Progress Notes (Signed)
 Mobility Specialist Progress Note:   07/18/23 1504  Mobility  Activity Ambulated with assistance in hallway  Level of Assistance Standby assist, set-up cues, supervision of patient - no hands on  Assistive Device Cane  Distance Ambulated (ft) 450 ft  Activity Response Tolerated well  Mobility Referral Yes  Mobility visit 1 Mobility  Mobility Specialist Start Time (ACUTE ONLY) 1420  Mobility Specialist Stop Time (ACUTE ONLY) 1435  Mobility Specialist Time Calculation (min) (ACUTE ONLY) 15 min   Pt received standing in room with chair alarm going off. RN present in room. Pt agreeable to ambulate in hallway. Pt pleasant but needing modA VC for redirection this session. Pt left in chair with call bell in reach and all needs met. Chair alarm on.   [name]  Mobility Specialist Please contact via [employer] office at [phone]

## 2023-07-18 NOTE — Progress Notes (Signed)
 Mobility Specialist Progress Note:   07/18/23 1113  Mobility  Activity Ambulated with assistance in hallway  Level of Assistance Modified independent, requires aide device or extra time  Assistive Device Cane;None  Distance Ambulated (ft) 450 ft  Activity Response Tolerated well  Mobility Referral Yes  Mobility visit 1 Mobility  Mobility Specialist Start Time (ACUTE ONLY) 0955  Mobility Specialist Stop Time (ACUTE ONLY) 1005  Mobility Specialist Time Calculation (min) (ACUTE ONLY) 10 min   Pt received EOB with bed alarm going off. Pt agreeable to ambulate in hallway. C/o bilateral foot pain, otherwise asx throughout. Pt left in chair with call bell in reach and all needs met. Chair alarm on.   [name]  Mobility Specialist Please contact via [facility] office at [phone]

## 2023-07-18 NOTE — Progress Notes (Addendum)
 Mobility Specialist Progress Note:   07/18/23 1452  Mobility  Activity Ambulated with assistance to bathroom  Level of Assistance Standby assist, set-up cues, supervision of patient - no hands on  Assistive Device None  Distance Ambulated (ft) 15 ft  Activity Response Tolerated well  Mobility Referral Yes  Mobility visit 1 Mobility  Mobility Specialist Start Time (ACUTE ONLY) 1347  Mobility Specialist Stop Time (ACUTE ONLY) 1352  Mobility Specialist Time Calculation (min) (ACUTE ONLY) 5 min   Pt received standing in room with chair alarm going off. Pt requesting to use BR.Void successful. Pt redirected back to chair with call bell in reach and all needs met.   [name]  Mobility Specialist Please contact via [employer] office at [phone]

## 2023-07-19 DIAGNOSIS — I639 Cerebral infarction, unspecified: Secondary | ICD-10-CM | POA: Diagnosis not present

## 2023-07-19 NOTE — Progress Notes (Signed)
 PROGRESS NOTE  Dennis [name] YQM:[id number] DOB: Jun 25, 1945 DOA: 06/21/2023 PCP: Pcp, No   LOS: 27 days   Brief narrative:  78 years old male with past medical history of hyperlipidemia, BPH, HTN, dementia, RA, COPD, OSA not on CPAP, and CVA who presented to the hospital on 2/1 with mental status and dysarthria.  He was found to have an acute infarct in the L posterior subinsular white matter on MRI.  He and his significant other both have dementia, awaiting guardianship from social services.  07/19/2023: Patient seen.  No new changes.    Assessment/Plan: Principal Problem:   Acute CVA (cerebrovascular accident) (HCC) Active Problems:   HLD (hyperlipidemia)   HTN (hypertension)   BPH (benign prostatic hyperplasia)   Dysphagia   Dementia with behavioral disturbance (HCC)   Agitation  Assessment and Plan:  Acute CVA Presented with AMS and dysarthria.  Continue Eliquis and Lipitor.  PT OT recommended skilled nursing facility placement.  Communicative but disoriented.    HTN Continue Coreg.  Blood pressure is stable at this time.   Dysphagia Underwent MBS on 2/11 with significantly improved swallow function associated with improved cognition.  Diet has been advanced to regular diet at this time.    Dementia On Seroquel.  Seen by psychiatry in consultation.  Feels like current dosing of Seroquel is adequate for the patient.  Stable  HFrEF Continue Coreg.  Last known ejection fraction of 25% with global hypokinesis.  Continue Imdur.  Appears compensated at this time.  On room air.  No gross edema.  BPH Continue tamsulosin and finasteride   Stage 3a CKD Continue to monitor.  Disposition. He lived independently with his wife PTA and is not currently safe to return home (his wife also has dementia so DSS is seeking guardianship for them both). Once guardianship is obtained, he will need placement. DSS is also working on LTC Medicaid for him  DVT prophylaxis:  apixaban (ELIQUIS)  tablet 5 mg   Disposition: Awaiting for placement.  Unsafe disposition so far  Status is: Inpatient  Remains inpatient appropriate because: Awaiting for placement, TOC involved.    Code Status:     Code Status: Full Code  Family Communication: spoke with the patient' neighbor on the phone who updated patient's wife at bedside on 07/12/2023.  Consultants: Palliative care Cardiology Neurology Psychiatry.  Procedures: Modified barium swallow  Anti-infectives:  None currently  Anti-infectives (From admission, onward)    None       Subjective: No new complaints.  Objective: Vitals:   07/19/23 0719 07/19/23 1614  BP: (!) 156/102 131/61  Pulse: 86 75  Resp: 16 16  Temp: 98.2 F (36.8 C) 98.1 F (36.7 C)  SpO2: 96% 96%   No intake or output data in the 24 hours ending 07/19/23 1802  Filed Weights   06/21/23 0000  Weight: 60 kg   Body mass index is 22.71 kg/m.   Physical Exam:  GENERAL: Not in any distress.  Patient is awake and alert. HEENT: Patient is pale.  No jaundice. Neck: Supple. Lungs: Clear to auscultation. CVs: S1-S2. Neuro: Awake and alert.  Moves all extremities. Extremities: No leg edema. Abdomen: Soft and nontender.   Data Review: I have personally reviewed the following laboratory data and studies,  CBC: Recent Labs  Lab 07/13/23 0632  WBC 9.1  HGB 11.4*  HCT 35.6*  MCV 87.5  PLT 337   Basic Metabolic Panel: Recent Labs  Lab 07/13/23 0632  NA 138  K 4.3  CL 105  CO2 27  GLUCOSE 93  BUN 26*  CREATININE 1.35*  CALCIUM 9.8   Liver Function Tests: No results for input(s): "AST", "ALT", "ALKPHOS", "BILITOT", "PROT", "ALBUMIN" in the last 168 hours. No results for input(s): "LIPASE", "AMYLASE" in the last 168 hours. No results for input(s): "AMMONIA" in the last 168 hours. Cardiac Enzymes: No results for input(s): "CKTOTAL", "CKMB", "CKMBINDEX", "TROPONINI" in the last 168 hours. BNP (last 3 results) Recent Labs     03/01/23 0245 03/03/23 0313 06/22/23 1251  BNP 1,535.3* 489.2* 946.6*    ProBNP (last 3 results) No results for input(s): "PROBNP" in the last 8760 hours.  CBG: No results for input(s): "GLUCAP" in the last 168 hours. No results found for this or any previous visit (from the past 240 hours).   Studies: No results found.  Time spent: 25 minutes.  Barnetta Chapel, MD  [facility] 07/19/2023  If 7PM-7AM, please contact night-coverage

## 2023-07-19 NOTE — Plan of Care (Signed)
  Problem: Health Behavior/Discharge Planning: Goal: Ability to manage health-related needs will improve Outcome: Not Progressing   Problem: Clinical Measurements: Goal: Ability to maintain clinical measurements within normal limits will improve Outcome: Not Progressing Goal: Will remain free from infection Outcome: Not Progressing Goal: Diagnostic test results will improve Outcome: Not Progressing Goal: Respiratory complications will improve Outcome: Not Progressing Goal: Cardiovascular complication will be avoided Outcome: Not Progressing   Problem: Activity: Goal: Risk for activity intolerance will decrease Outcome: Not Progressing   Problem: Nutrition: Goal: Adequate nutrition will be maintained Outcome: Not Progressing   Problem: Coping: Goal: Level of anxiety will decrease Outcome: Not Progressing   Problem: Elimination: Goal: Will not experience complications related to bowel motility Outcome: Not Progressing Goal: Will not experience complications related to urinary retention Outcome: Not Progressing   Problem: Pain Managment: Goal: General experience of comfort will improve and/or be controlled Outcome: Not Progressing   Problem: Safety: Goal: Ability to remain free from injury will improve Outcome: Not Progressing   Problem: Skin Integrity: Goal: Risk for impaired skin integrity will decrease Outcome: Not Progressing

## 2023-07-20 DIAGNOSIS — I639 Cerebral infarction, unspecified: Secondary | ICD-10-CM | POA: Diagnosis not present

## 2023-07-20 NOTE — Progress Notes (Signed)
 PROGRESS NOTE  Dennis [name] [id number]:[id number] DOB: Feb 12, 1946 DOA: 06/21/2023 PCP: Pcp, No   LOS: 28 days   Brief narrative:  78 years old male with past medical history of hyperlipidemia, BPH, HTN, dementia, RA, COPD, OSA not on CPAP, and CVA who presented to the hospital on 2/1 with mental status and dysarthria.  He was found to have an acute infarct in the L posterior subinsular white matter on MRI.  He and his significant other both have dementia, awaiting guardianship from social services.  07/20/2023: Patient seen.  No new changes.    Assessment/Plan: Principal Problem:   Acute CVA (cerebrovascular accident) (HCC) Active Problems:   HLD (hyperlipidemia)   HTN (hypertension)   BPH (benign prostatic hyperplasia)   Dysphagia   Dementia with behavioral disturbance (HCC)   Agitation  Assessment and Plan:  Acute CVA Presented with AMS and dysarthria.  Continue Eliquis and Lipitor.  PT OT recommended skilled nursing facility placement.  Communicative but disoriented.    HTN Continue Coreg.  Blood pressure is stable at this time.   Dysphagia Underwent MBS on 2/11 with significantly improved swallow function associated with improved cognition.  Diet has been advanced to regular diet at this time.    Dementia On Seroquel.  Seen by psychiatry in consultation.  Feels like current dosing of Seroquel is adequate for the patient.  Stable  HFrEF Continue Coreg.  Last known ejection fraction of 25% with global hypokinesis.  Continue Imdur.  Appears compensated at this time.  On room air.  No gross edema.  BPH Continue tamsulosin and finasteride   Stage 3a CKD Continue to monitor.  Disposition. He lived independently with his wife PTA and is not currently safe to return home (his wife also has dementia so DSS is seeking guardianship for them both). Once guardianship is obtained, he will need placement. DSS is also working on LTC Medicaid for him  DVT prophylaxis:  apixaban (ELIQUIS)  tablet 5 mg   Disposition: Awaiting for placement.  Unsafe disposition so far  Status is: Inpatient  Remains inpatient appropriate because: Awaiting for placement, TOC involved.    Code Status:     Code Status: Full Code  Family Communication: spoke with the patient' neighbor on the phone who updated patient's wife at bedside on 07/12/2023.  Consultants: Palliative care Cardiology Neurology Psychiatry.  Procedures: Modified barium swallow  Anti-infectives:  None currently  Anti-infectives (From admission, onward)    None       Subjective: No new complaints.  Objective: Vitals:   07/19/23 2100 07/20/23 0924  BP: (!) 158/76 (!) 97/50  Pulse: 92 89  Resp: 18 20  Temp: 97.9 F (36.6 C) 98.2 F (36.8 C)  SpO2: 96% 93%    Intake/Output Summary (Last 24 hours) at 07/20/2023 1447 Last data filed at 07/19/2023 2148 Gross per 24 hour  Intake 480 ml  Output --  Net 480 ml    Filed Weights   06/21/23 0000  Weight: 60 kg   Body mass index is 22.71 kg/m.   Physical Exam:  GENERAL: Not in any distress.  Patient is awake and alert. HEENT: Patient is pale.  No jaundice. Neck: Supple. Lungs: Clear to auscultation. CVs: S1-S2. Neuro: Awake and alert.  Moves all extremities. Extremities: No leg edema. Abdomen: Soft and nontender.   Data Review: I have personally reviewed the following laboratory data and studies,  CBC: No results for input(s): "WBC", "NEUTROABS", "HGB", "HCT", "MCV", "PLT" in the last 168 hours.  Basic  Metabolic Panel: No results for input(s): "NA", "K", "CL", "CO2", "GLUCOSE", "BUN", "CREATININE", "CALCIUM", "MG", "PHOS" in the last 168 hours.  Liver Function Tests: No results for input(s): "AST", "ALT", "ALKPHOS", "BILITOT", "PROT", "ALBUMIN" in the last 168 hours. No results for input(s): "LIPASE", "AMYLASE" in the last 168 hours. No results for input(s): "AMMONIA" in the last 168 hours. Cardiac Enzymes: No results for input(s):  "CKTOTAL", "CKMB", "CKMBINDEX", "TROPONINI" in the last 168 hours. BNP (last 3 results) Recent Labs    03/01/23 0245 03/03/23 0313 06/22/23 1251  BNP 1,535.3* 489.2* 946.6*    ProBNP (last 3 results) No results for input(s): "PROBNP" in the last 8760 hours.  CBG: No results for input(s): "GLUCAP" in the last 168 hours. No results found for this or any previous visit (from the past 240 hours).   Studies: No results found.  Time spent: 25 minutes.  Barnetta Chapel, MD  [facility] 07/20/2023  If 7PM-7AM, please contact night-coverage

## 2023-07-21 DIAGNOSIS — I639 Cerebral infarction, unspecified: Secondary | ICD-10-CM | POA: Diagnosis not present

## 2023-07-21 NOTE — Progress Notes (Signed)
 Mobility Specialist Progress Note:   07/21/23 1124  Mobility  Activity Ambulated independently in hallway  Level of Assistance Modified independent, requires aide device or extra time  Assistive Device Cane  Distance Ambulated (ft) 460 ft  Activity Response Tolerated well  Mobility Referral Yes  Mobility visit 1 Mobility  Mobility Specialist Start Time (ACUTE ONLY) 0940  Mobility Specialist Stop Time (ACUTE ONLY) 1000  Mobility Specialist Time Calculation (min) (ACUTE ONLY) 20 min   Pt received in chair, agreeable to mobility. Pt displayed slower gait pace this session. No complaints throughout session. Pt returned to chair with call bell in reach and all needs met.   [name]  Mobility Specialist Please contact via [employer] office at [phone]

## 2023-07-21 NOTE — TOC Progression Note (Addendum)
 Transition of Care [facility]) - Progression Note    Patient Details  Name: Dennis [name] MRN: [id number] Date of Birth: 10-08-1945  [facility]) CM/SW Contact  Carley Hammed, LCSW Phone Number: 07/21/2023, 3:37 PM  Clinical Narrative:    CSW attempted to follow up with Joni Reining from [employer] [phone] ) to discuss updates and plan for pt. TOC will continue to follow.   3:45 CSW spoke with Joni Reining who stated pt now has a DSS Guardian. They are working on determining his income and how much money is in his account. Joni Reining to work on [employer] as well. CSW and Joni Reining discussed possible placement options for Memory Care. CSW to send referrals for memory care. TOC will continue to follow.   Expected Discharge Plan: Skilled Nursing Facility Barriers to Discharge: Insurance Authorization, SNF Pending bed offer, Continued Medical Work up, Unsafe home situation  Expected Discharge Plan and Services     Post Acute Care Choice: Skilled Nursing Facility Living arrangements for the past 2 months: Single Family Home                                       Social Determinants of Health (SDOH) Interventions SDOH Screenings   Food Insecurity: No Food Insecurity (06/21/2023)  Housing: Low Risk  (06/21/2023)  Transportation Needs: No Transportation Needs (06/21/2023)  Utilities: Not At Risk (06/21/2023)  Social Connections: Moderately Isolated (06/21/2023)  Tobacco Use: Medium Risk (06/21/2023)    Readmission Risk Interventions     No data to display

## 2023-07-21 NOTE — Plan of Care (Signed)
  Problem: Health Behavior/Discharge Planning: Goal: Ability to manage health-related needs will improve Outcome: Not Progressing   Problem: Clinical Measurements: Goal: Ability to maintain clinical measurements within normal limits will improve Outcome: Not Progressing Goal: Will remain free from infection Outcome: Not Progressing Goal: Diagnostic test results will improve Outcome: Not Progressing Goal: Respiratory complications will improve Outcome: Not Progressing Goal: Cardiovascular complication will be avoided Outcome: Not Progressing   Problem: Activity: Goal: Risk for activity intolerance will decrease Outcome: Not Progressing   Problem: Nutrition: Goal: Adequate nutrition will be maintained Outcome: Not Progressing   Problem: Coping: Goal: Level of anxiety will decrease Outcome: Not Progressing   Problem: Elimination: Goal: Will not experience complications related to bowel motility Outcome: Not Progressing Goal: Will not experience complications related to urinary retention Outcome: Not Progressing   Problem: Pain Managment: Goal: General experience of comfort will improve and/or be controlled Outcome: Not Progressing   Problem: Safety: Goal: Ability to remain free from injury will improve Outcome: Not Progressing   Problem: Skin Integrity: Goal: Risk for impaired skin integrity will decrease Outcome: Not Progressing

## 2023-07-21 NOTE — Progress Notes (Signed)
 PROGRESS NOTE  Dennis [name] ZOX:[id number] DOB: [birth year] DOA: 06/21/2023 PCP: Pcp, No   LOS: 29 days   Brief narrative:  78 years old male with past medical history of hyperlipidemia, BPH, HTN, dementia, RA, COPD, OSA not on CPAP, and CVA who presented to the hospital on 2/1 with mental status and dysarthria.  He was found to have an acute infarct in the L posterior subinsular white matter on MRI.  He and his significant other both have dementia, awaiting guardianship from social services.  07/21/2023: Patient seen.  No new changes.    Assessment/Plan: Principal Problem:   Acute CVA (cerebrovascular accident) (HCC) Active Problems:   HLD (hyperlipidemia)   HTN (hypertension)   BPH (benign prostatic hyperplasia)   Dysphagia   Dementia with behavioral disturbance (HCC)   Agitation  Assessment and Plan:  Acute CVA Presented with AMS and dysarthria.  Continue Eliquis and Lipitor.  PT OT recommended skilled nursing facility placement.  Communicative but disoriented.    HTN Continue Coreg.  Blood pressure is stable at this time.   Dysphagia Underwent MBS on 2/11 with significantly improved swallow function associated with improved cognition.  Diet has been advanced to regular diet at this time.    Dementia On Seroquel.  Seen by psychiatry in consultation.  Feels like current dosing of Seroquel is adequate for the patient.  Stable  HFrEF Continue Coreg.  Last known ejection fraction of 25% with global hypokinesis.  Continue Imdur.  Appears compensated at this time.  On room air.  No gross edema.  BPH Continue tamsulosin and finasteride   Stage 3a CKD Continue to monitor.  Disposition. He lived independently with his wife PTA and is not currently safe to return home (his wife also has dementia so DSS is seeking guardianship for them both). Once guardianship is obtained, he will need placement. DSS is also working on LTC Medicaid for him  DVT prophylaxis:  apixaban (ELIQUIS)  tablet 5 mg   Disposition: Awaiting for placement.  Unsafe disposition so far  Status is: Inpatient  Remains inpatient appropriate because: Awaiting for placement, TOC involved.    Code Status:     Code Status: Full Code  Family Communication: spoke with the patient' neighbor on the phone who updated patient's wife at bedside on 07/12/2023.  Consultants: Palliative care Cardiology Neurology Psychiatry.  Procedures: Modified barium swallow  Anti-infectives:  None currently  Anti-infectives (From admission, onward)    None       Subjective: No new complaints.  Objective: Vitals:   07/21/23 0744 07/21/23 1547  BP: (!) 142/73 (!) 122/57  Pulse: 80 78  Resp: 16 16  Temp: 98.2 F (36.8 C) 98.3 F (36.8 C)  SpO2: 95% 95%   No intake or output data in the 24 hours ending 07/21/23 1802   Filed Weights   06/21/23 0000  Weight: 60 kg   Body mass index is 22.71 kg/m.   Physical Exam:  GENERAL: Not in any distress.  Patient is awake and alert. HEENT: Patient is pale.  No jaundice. Neck: Supple. Lungs: Clear to auscultation. CVs: S1-S2. Neuro: Awake and alert.  Moves all extremities. Extremities: No leg edema. Abdomen: Soft and nontender.   Data Review: I have personally reviewed the following laboratory data and studies,  CBC: No results for input(s): "WBC", "NEUTROABS", "HGB", "HCT", "MCV", "PLT" in the last 168 hours.  Basic Metabolic Panel: No results for input(s): "NA", "K", "CL", "CO2", "GLUCOSE", "BUN", "CREATININE", "CALCIUM", "MG", "PHOS" in the last  168 hours.  Liver Function Tests: No results for input(s): "AST", "ALT", "ALKPHOS", "BILITOT", "PROT", "ALBUMIN" in the last 168 hours. No results for input(s): "LIPASE", "AMYLASE" in the last 168 hours. No results for input(s): "AMMONIA" in the last 168 hours. Cardiac Enzymes: No results for input(s): "CKTOTAL", "CKMB", "CKMBINDEX", "TROPONINI" in the last 168 hours. BNP (last 3  results) Recent Labs    03/01/23 0245 03/03/23 0313 06/22/23 1251  BNP 1,535.3* 489.2* 946.6*    ProBNP (last 3 results) No results for input(s): "PROBNP" in the last 8760 hours.  CBG: No results for input(s): "GLUCAP" in the last 168 hours. No results found for this or any previous visit (from the past 240 hours).   Studies: No results found.  Time spent: 25 minutes.  Barnetta Chapel, MD  [facility] 07/21/2023  If 7PM-7AM, please contact night-coverage

## 2023-07-22 DIAGNOSIS — I639 Cerebral infarction, unspecified: Secondary | ICD-10-CM | POA: Diagnosis not present

## 2023-07-22 NOTE — Progress Notes (Signed)
 PROGRESS NOTE  Dennis [name] [id number]:[id number] DOB: 11-13-1945 DOA: 06/21/2023 PCP: Pcp, No   LOS: 30 days   Brief narrative:  78 years old male with past medical history of hyperlipidemia, BPH, HTN, dementia, RA, COPD, OSA not on CPAP, and CVA who presented to the hospital on 2/1 with mental status and dysarthria.  He was found to have an acute infarct in the L posterior subinsular white matter on MRI.  He and his significant other both have dementia, awaiting guardianship from social services.  07/22/2023: Patient seen.  No new changes.    Assessment/Plan: Principal Problem:   Acute CVA (cerebrovascular accident) (HCC) Active Problems:   HLD (hyperlipidemia)   HTN (hypertension)   BPH (benign prostatic hyperplasia)   Dysphagia   Dementia with behavioral disturbance (HCC)   Agitation  Assessment and Plan:  Acute CVA Presented with AMS and dysarthria.  Continue Eliquis and Lipitor.  PT OT recommended skilled nursing facility placement.  Communicative but disoriented.    HTN Continue Coreg.  Blood pressure is stable at this time.   Dysphagia Underwent MBS on 2/11 with significantly improved swallow function associated with improved cognition.  Diet has been advanced to regular diet at this time.    Dementia On Seroquel.  Seen by psychiatry in consultation.  Feels like current dosing of Seroquel is adequate for the patient.  Stable  HFrEF Continue Coreg.  Last known ejection fraction of 25% with global hypokinesis.  Continue Imdur.  Appears compensated at this time.  On room air.  No gross edema.  BPH Continue tamsulosin and finasteride   Stage 3a CKD Continue to monitor.  Disposition. He lived independently with his wife PTA and is not currently safe to return home (his wife also has dementia so DSS is seeking guardianship for them both). Once guardianship is obtained, he will need placement. DSS is also working on LTC Medicaid for him  DVT prophylaxis:  apixaban (ELIQUIS)  tablet 5 mg   Disposition: Awaiting for placement.  Unsafe disposition so far  Status is: Inpatient  Remains inpatient appropriate because: Awaiting for placement, TOC involved.    Code Status:     Code Status: Full Code  Family Communication:   Consultants: Palliative care Cardiology Neurology Psychiatry.  Procedures: Modified barium swallow  Anti-infectives:  None currently  Anti-infectives (From admission, onward)    None       Subjective: No new complaints.  Objective: Vitals:   07/22/23 1539 07/22/23 1740  BP: 123/63 (!) 154/67  Pulse: 88 88  Resp: 18   Temp: 98.3 F (36.8 C)   SpO2: 95%     Intake/Output Summary (Last 24 hours) at 07/22/2023 1805 Last data filed at 07/22/2023 1538 Gross per 24 hour  Intake 960 ml  Output --  Net 960 ml     Filed Weights   06/21/23 0000  Weight: 60 kg   Body mass index is 22.71 kg/m.   Physical Exam:  GENERAL: Not in any distress.  Patient is awake and alert. HEENT: Patient is pale.  No jaundice. Neck: Supple. Lungs: Clear to auscultation. CVs: S1-S2. Neuro: Awake and alert.  Moves all extremities. Extremities: No leg edema. Abdomen: Soft and nontender.   Data Review: I have personally reviewed the following laboratory data and studies,  CBC: No results for input(s): "WBC", "NEUTROABS", "HGB", "HCT", "MCV", "PLT" in the last 168 hours.  Basic Metabolic Panel: No results for input(s): "NA", "K", "CL", "CO2", "GLUCOSE", "BUN", "CREATININE", "CALCIUM", "MG", "PHOS" in the  last 168 hours.  Liver Function Tests: No results for input(s): "AST", "ALT", "ALKPHOS", "BILITOT", "PROT", "ALBUMIN" in the last 168 hours. No results for input(s): "LIPASE", "AMYLASE" in the last 168 hours. No results for input(s): "AMMONIA" in the last 168 hours. Cardiac Enzymes: No results for input(s): "CKTOTAL", "CKMB", "CKMBINDEX", "TROPONINI" in the last 168 hours. BNP (last 3 results) Recent Labs    03/01/23 0245  03/03/23 0313 06/22/23 1251  BNP 1,535.3* 489.2* 946.6*    ProBNP (last 3 results) No results for input(s): "PROBNP" in the last 8760 hours.  CBG: No results for input(s): "GLUCAP" in the last 168 hours. No results found for this or any previous visit (from the past 240 hours).   Studies: No results found.  Time spent: 25 minutes.  Barnetta Chapel, MD  [facility] 07/22/2023  If 7PM-7AM, please contact night-coverage

## 2023-07-22 NOTE — TOC Progression Note (Signed)
 Transition of Care [facility]) - Progression Note    Patient Details  Name: Dennis [name] MRN: [id number] Date of Birth: 1945-09-29  Transition [facility]) CM/SW Contact  Carley Hammed, LCSW Phone Number: 07/22/2023, 12:54 PM  Clinical Narrative:    CSW sent referral to [facility], plan for Teams meeting for facility to assess pt tomorrow. Pt was served [employer], CSW made a copy for the chart. CSW attempted to call Joni Reining with [facility], unable to leave VM. TOC will continue to follow.    Expected Discharge Plan: Skilled Nursing Facility Barriers to Discharge: Insurance Authorization, SNF Pending bed offer, Continued Medical Work up, Unsafe home situation  Expected Discharge Plan and Services     Post Acute Care Choice: Skilled Nursing Facility Living arrangements for the past 2 months: Single Family Home                                       Social Determinants of Health (SDOH) Interventions SDOH Screenings   Food Insecurity: No Food Insecurity (06/21/2023)  Housing: Low Risk  (06/21/2023)  Transportation Needs: No Transportation Needs (06/21/2023)  Utilities: Not At Risk (06/21/2023)  Social Connections: Moderately Isolated (06/21/2023)  Tobacco Use: Medium Risk (06/21/2023)    Readmission Risk Interventions     No data to display

## 2023-07-22 NOTE — Progress Notes (Addendum)
 Occupational Therapy Treatment and Discharge  Patient Details Name: LATHON ADAN MRN: [id number] DOB: May 09, 1946 Today's Date: 07/22/2023   History of present illness The pt is a 78 yo male presenting 2/1 from home with concern for stroke (slurred speech and mild L facial droop). MRI showed:  small area of acute infarct in the more superior left frontal cortex and 2 areas of subacute ischemia in superior R frontal lobe. Pt with overnight agitation 2/18 and at high risk of delirium per psychiatry consult 07/10/23. PMH includes: dementia, arthritis, HTN, HLD, RA, COPD, OSA, and CVA.   OT comments  Pt agreeable to OT session. Completing mobility in room without assist, engaged in ADLS with supervision. He remains oriented to self, able to state hospital today but not location.  He is internally focused on his spouse and where they can live together, redirected with increased time and notified CM/SW of pts concerns.  With known PMH of cognitive deficits, dementia, anticipate pt is at functional baseline and no further acute OT needs identified at this time. Recommend memory care at dc, OT will sign off acutely.       If plan is discharge home, recommend the following:  Assistance with cooking/housework;Direct supervision/assist for medications management;Direct supervision/assist for financial management;Assist for transportation;Supervision due to cognitive status   Equipment Recommendations  Tub/shower seat (depending on facility setup)    Recommendations for Other Services      Precautions / Restrictions Precautions Precautions: Fall Recall of Precautions/Restrictions: Impaired Precaution/Restrictions Comments: poor awareness of situation Restrictions Weight Bearing Restrictions Per Provider Order: No       Mobility Bed Mobility               General bed mobility comments: EOB upon entry    Transfers Overall transfer level: Needs assistance Equipment used: None Transfers:  Sit to/from Stand Sit to Stand: Modified independent (Device/Increase time)                 Balance Overall balance assessment: Needs assistance Sitting-balance support: No upper extremity supported, Feet supported Sitting balance-Leahy Scale: Good     Standing balance support: No upper extremity supported, During functional activity Standing balance-Leahy Scale: Fair                             ADL either performed or assessed with clinical judgement   ADL Overall ADL's : Needs assistance/impaired     Grooming: Supervision/safety;Standing               Lower Body Dressing: Supervision/safety;Sit to/from stand   Toilet Transfer: Supervision/safety;Ambulation   Toileting- Clothing Manipulation and Hygiene: Modified independent;Sit to/from stand       Functional mobility during ADLs: Supervision/safety      Extremity/Trunk Assessment              Vision       Perception     Praxis     Communication Communication Communication: Impaired Factors Affecting Communication: Reduced clarity of speech;Hearing impaired   Cognition Arousal: Alert Behavior During Therapy: Impulsive Cognition: History of cognitive impairments             OT - Cognition Comments: pt oriented to self and hopsital (but not location).  He is able to follow 1step commands, sequencing basic ADLs without support but poor awareness to deficits and need for increased assist at dc.  Internally distracted by concerns of spouse.  Following commands: Intact Following commands impaired: Follows one step commands with increased time, Only follows one step commands consistently      Cueing   Cueing Techniques: Verbal cues  Exercises      Shoulder Instructions       General Comments      Pertinent Vitals/ Pain       Pain Assessment Pain Assessment: No/denies pain  Home Living                                           Prior Functioning/Environment              Frequency  Min 1X/week        Progress Toward Goals  OT Goals(current goals can now be found in the care plan section)  Progress towards OT goals: Goals met/education completed, patient discharged from OT  Acute Rehab OT Goals Patient Stated Goal: not stated  Plan      Co-evaluation                 AM-PAC OT "6 Clicks" Daily Activity     Outcome Measure   Help from another person eating meals?: None Help from another person taking care of personal grooming?: A Little Help from another person toileting, which includes using toliet, bedpan, or urinal?: A Little Help from another person bathing (including washing, rinsing, drying)?: A Little Help from another person to put on and taking off regular upper body clothing?: A Little Help from another person to put on and taking off regular lower body clothing?: A Little 6 Click Score: 19    End of Session    OT Visit Diagnosis: Other abnormalities of gait and mobility (R26.89);Muscle weakness (generalized) (M62.81);Other symptoms and signs involving cognitive function   Activity Tolerance Patient tolerated treatment well   Patient Left in chair;with chair alarm set;with nursing/sitter in room;with call bell/phone within reach   Nurse Communication Mobility status        Time: 8657-8469 OT Time Calculation (min): 28 min  Charges: OT General Charges $OT Visit: 1 Visit OT Treatments $Self Care/Home Management : 23-37 mins  Barry Brunner, OT Acute Rehabilitation Services Office [phone] Secure Chat Preferred    Chancy Milroy 07/22/2023, 12:08 PM

## 2023-07-22 NOTE — Plan of Care (Signed)
   Problem: Activity: Goal: Risk for activity intolerance will decrease Outcome: Progressing   Problem: Nutrition: Goal: Adequate nutrition will be maintained Outcome: Progressing   Problem: Safety: Goal: Ability to remain free from injury will improve Outcome: Progressing

## 2023-07-22 NOTE — Progress Notes (Signed)
 Mobility Specialist Progress Note:   07/22/23 1225  Mobility  Activity Ambulated independently in hallway  Level of Assistance Modified independent, requires aide device or extra time  Assistive Device Cane  Distance Ambulated (ft) 460 ft  Activity Response Tolerated well  Mobility Referral Yes  Mobility visit 1 Mobility  Mobility Specialist Start Time (ACUTE ONLY) 1155  Mobility Specialist Stop Time (ACUTE ONLY) 1205  Mobility Specialist Time Calculation (min) (ACUTE ONLY) 10 min   Pt received in chair, pleasant and agreeable to mobility. 1x slight LOB during ambulation but pt quickly corrected with CG assist. No complaints stated during session, asymptomatic throughout. Pt returned to chair with call bell in reach and all needs met.  [name]  Mobility Specialist Please contact via [employer] office at [phone]

## 2023-07-22 NOTE — Plan of Care (Signed)
   Problem: Activity: Goal: Risk for activity intolerance will decrease Outcome: Progressing   Problem: Nutrition: Goal: Adequate nutrition will be maintained Outcome: Progressing   Problem: Coping: Goal: Level of anxiety will decrease Outcome: Progressing   Problem: Elimination: Goal: Will not experience complications related to bowel motility Outcome: Progressing   Problem: Pain Managment: Goal: General experience of comfort will improve and/or be controlled Outcome: Progressing   Problem: Safety: Goal: Ability to remain free from injury will improve Outcome: Progressing   Problem: Skin Integrity: Goal: Risk for impaired skin integrity will decrease Outcome: Progressing

## 2023-07-23 DIAGNOSIS — I639 Cerebral infarction, unspecified: Secondary | ICD-10-CM | POA: Diagnosis not present

## 2023-07-23 LAB — CBC
HCT: 36.5 % — ABNORMAL LOW (ref 39.0–52.0)
Hemoglobin: 11.3 g/dL — ABNORMAL LOW (ref 13.0–17.0)
MCH: 27.4 pg (ref 26.0–34.0)
MCHC: 31 g/dL (ref 30.0–36.0)
MCV: 88.4 fL (ref 80.0–100.0)
Platelets: 256 10*3/uL (ref 150–400)
RBC: 4.13 MIL/uL — ABNORMAL LOW (ref 4.22–5.81)
RDW: 15.4 % (ref 11.5–15.5)
WBC: 8.4 10*3/uL (ref 4.0–10.5)
nRBC: 0 % (ref 0.0–0.2)

## 2023-07-23 LAB — COMPREHENSIVE METABOLIC PANEL
ALT: 23 U/L (ref 0–44)
AST: 24 U/L (ref 15–41)
Albumin: 3.1 g/dL — ABNORMAL LOW (ref 3.5–5.0)
Alkaline Phosphatase: 26 U/L — ABNORMAL LOW (ref 38–126)
Anion gap: 9 (ref 5–15)
BUN: 17 mg/dL (ref 8–23)
CO2: 24 mmol/L (ref 22–32)
Calcium: 9.5 mg/dL (ref 8.9–10.3)
Chloride: 107 mmol/L (ref 98–111)
Creatinine, Ser: 1.34 mg/dL — ABNORMAL HIGH (ref 0.61–1.24)
GFR, Estimated: 54 mL/min — ABNORMAL LOW (ref >60)
Glucose, Bld: 114 mg/dL — ABNORMAL HIGH (ref 70–99)
Potassium: 4.6 mmol/L (ref 3.5–5.1)
Sodium: 140 mmol/L (ref 135–145)
Total Bilirubin: 0.4 mg/dL (ref 0.0–1.2)
Total Protein: 6.6 g/dL (ref 6.5–8.1)

## 2023-07-23 NOTE — TOC Progression Note (Signed)
 Transition of Care [facility]) - Progression Note    Patient Details  Name: Dennis [name] MRN: [id number] Date of Birth: 03-16-1946  Transition of Care [facility]) CM/SW Contact  Carley Hammed, LCSW Phone Number: 07/23/2023, 1:54 PM  Clinical Narrative:    CSW and pt met with director and nursing director for [facility] for an assessment. Nursing director attempted to ask orientation questions to pt, he was pleasant but unable to follow. CSW followed up with facility and provided updated FL2, DSS worker info, and requested a TB test from MD.  CSW attempted to contact DSS worker, unable to leave VM. Facility spoke with DSS worker and they are working on payor source now, possible bed by Tuesday. TOC will continue to follow.    Expected Discharge Plan: Skilled Nursing Facility Barriers to Discharge: Insurance Authorization, SNF Pending bed offer, Continued Medical Work up, Unsafe home situation  Expected Discharge Plan and Services     Post Acute Care Choice: Skilled Nursing Facility Living arrangements for the past 2 months: Single Family Home                                       Social Determinants of Health (SDOH) Interventions SDOH Screenings   Food Insecurity: No Food Insecurity (06/21/2023)  Housing: Low Risk  (06/21/2023)  Transportation Needs: No Transportation Needs (06/21/2023)  Utilities: Not At Risk (06/21/2023)  Social Connections: Moderately Isolated (06/21/2023)  Tobacco Use: Medium Risk (06/21/2023)    Readmission Risk Interventions     No data to display

## 2023-07-23 NOTE — Progress Notes (Signed)
 Mobility Specialist Progress Note:   07/23/23 1132  Mobility  Activity Ambulated independently in hallway  Level of Assistance Modified independent, requires aide device or extra time  Assistive Device Cane  Distance Ambulated (ft) 200 ft  Activity Response Tolerated well  Mobility Referral Yes  Mobility visit 1 Mobility  Mobility Specialist Start Time (ACUTE ONLY) 0915  Mobility Specialist Stop Time (ACUTE ONLY) 0925  Mobility Specialist Time Calculation (min) (ACUTE ONLY) 10 min   Pt received in hallway agreeable for MS to join. Pt still confused but seemed more alert this session. Denied any complaints during ambulation, asx throughout. Pt left in chair with call bell in reach and all needs met.   [name]  Mobility Specialist Please contact via [employer] office at [phone]

## 2023-07-23 NOTE — Progress Notes (Addendum)
 Speech Language Pathology Treatment: Cognitive-Linquistic  Patient Details Name: Dennis [name] MRN: [id number] DOB: August 30, 1945 Today's Date: 07/23/2023 Time: 4098-1191 SLP Time Calculation (min) (ACUTE ONLY): 23 min  Assessment / Plan / Recommendation Clinical Impression  Pt seen for language and cognitive intervention. Pt initially with decreased sustained attention and expressing to therapist about his wife is taking his money with decreased semantics noted. Therapist provided brief supportive listening then redirected pt to task with only 2 requests needed. Spaced retrieval intervention utilized for pt to recall relevant information (disposition is to rehab). Pt able to state "rehab" after 10 second delay as well as when therapist doubled increments up to 40 seconds. SLP moved on to other tasks and returned to spaced retrieval request with pt unable to recall intended target word at 2 minute mark. Therapist branched down to 15 seconds doubling time in increments up to 90 seconds and did not introduce new information during that time. He recalled "rehab" at 60 seconds but unable to recall at 90. Significant improvement noted in object naming to 87% accuracy only needing phonemic cue x 1 (goal was for naming with phonemic cue). He also responded to biographical/environmental yes/no questions with 100% accuracy. Pt had more difficulty following 2 step commands and was only able to follow one step command in isolation; would incorrectly repeat direction to therapist and SLP used written instructions which did not improve accuracy. Therapy will focus on communication (comprehension/expression) for relevant information as well as using strategies to improve memory, attention.   HPI HPI: Pt is a 78 y.o. male presented to ED after significant other noted him to be acutely more confused than his baseline and having speech difficulty. EMS noted him to have (L) facial droop and expressive aphasia as well as  hypertensive en route to ED. MRI (06/21/23) revealed, "Punctate acute infarct in the left posterior subinsular white matter... small area of acute infarct in the more superior left frontal cortex. There also 2 punctate areas of diffusion hyperintensity without ADC correlate in the superior right frontal lobe which may be areas of subacute ischemia". Previous acute SLP services provided for dysphagia on 03/03/23, recommended advancement of diet to dys 3/thin liquids given improvement in mentation and respiratory status. PMH: HLD, BPH, HTN, dementia, rheumatoid arthritis, COPD, Barrett's esophagus, OSA does not use CPAP, history of CVA.      SLP Plan  Continue with current plan of care      Recommendations for follow up therapy are one component of a multi-disciplinary discharge planning process, led by the attending physician.  Recommendations may be updated based on patient status, additional functional criteria and insurance authorization.    Recommendations                     Oral care BID   Frequent or constant Supervision/Assistance Aphasia (R47.01);Cognitive communication deficit (Y78.295)     Continue with current plan of care     Royce Macadamia  07/23/2023, 12:05 PM

## 2023-07-23 NOTE — Plan of Care (Signed)
  Problem: Pain Managment: Goal: General experience of comfort will improve and/or be controlled Outcome: Progressing   Problem: Safety: Goal: Ability to remain free from injury will improve Outcome: Progressing

## 2023-07-23 NOTE — Progress Notes (Signed)
 Physical Therapy Treatment and Discharge Patient Details Name: Dennis [name] MRN: [id number] DOB: 23-Dec-1945 Today's Date: 07/23/2023   History of Present Illness The pt is a 78 yo male presenting 2/1 from home with concern for stroke (slurred speech and mild L facial droop). MRI showed:  small area of acute infarct in the more superior left frontal cortex and 2 areas of subacute ischemia in superior R frontal lobe. Pt with overnight agitation 2/18 and at high risk of delirium per psychiatry consult 07/10/23. PMH includes: dementia, arthritis, HTN, HLD, RA, COPD, OSA, and CVA.    PT Comments  Session focused on progressive amb with balance challenges throughout; Dennis [name] walked well without an assistive device (for a large portion of the walk, he carried and ate a cup of ice cream); Good progress; Majority of PT goals met; will sign off      If plan is discharge home, recommend the following: Supervision due to cognitive status;Direct supervision/assist for financial management;Direct supervision/assist for medications management   Can travel by private vehicle     Yes  Equipment Recommendations  None recommended by PT    Recommendations for Other Services       Precautions / Restrictions Precautions Precautions: Fall (While fall risk is present, it is minimal) Recall of Precautions/Restrictions: Impaired Precaution/Restrictions Comments: poor awareness of situation Restrictions Weight Bearing Restrictions Per Provider Order: No     Mobility  Bed Mobility               General bed mobility comments: Initiated session with ptwhile he was sitting at the table near the front nurses station    Transfers Overall transfer level: Modified independent Equipment used: None Transfers: Sit to/from Stand Sit to Stand: Modified independent (Device/Increase time)                Ambulation/Gait Ambulation/Gait assistance: Modified independent (Device/Increase time),  Supervision Gait Distance (Feet): 800 Feet Assistive device: None Gait Pattern/deviations: Step-through pattern Gait velocity: functional     General Gait Details: walked without assistive device, and carrying and eating a cup of chocolate ice cream; overall steady, with one small loss of balance while turning on a slight incline - he recovered his balance without physical assist   Stairs Stairs: Yes Stairs assistance: Modified independent (Device/Increase time) Stair Management: No rails Number of Stairs: 1 (x 8-10 during step up exercise) General stair comments: step ups in 6th floor hallway between 6C and 6N   Wheelchair Mobility     Tilt Bed    Modified Rankin (Stroke Patients Only)       Balance     Sitting balance-Leahy Scale: Good       Standing balance-Leahy Scale: Good                              Communication Communication Communication: Impaired Factors Affecting Communication: Reduced clarity of speech;Hearing impaired  Cognition Arousal: Alert Behavior During Therapy: Impulsive   PT - Cognitive impairments: History of cognitive impairments                         Following commands: Intact Following commands impaired: Follows one step commands with increased time, Only follows one step commands consistently    Cueing Cueing Techniques: Verbal cues, Gestural cues  Exercises      General Comments General comments (skin integrity, edema, etc.): Pt pleasant and participating; 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      Pertinent Vitals/Pain Pain Assessment Pain Assessment: No/denies pain    Home Living                          Prior Function            PT Goals (current goals can now be found in the care plan section) Acute Rehab PT Goals Patient Stated Goal: Did not specifically state, but agrees to walking as he finishes his ice cream Progress towards PT goals: Goals met/education  completed, patient discharged from PT    Frequency           PT Plan      Co-evaluation              AM-PAC PT "6 Clicks" Mobility   Outcome Measure  Help needed turning from your back to your side while in a flat bed without using bedrails?: None Help needed moving from lying on your back to sitting on the side of a flat bed without using bedrails?: None Help needed moving to and from a bed to a chair (including a wheelchair)?: None Help needed standing up from a chair using your arms (e.g., wheelchair or bedside chair)?: None Help needed to walk in hospital room?: None Help needed climbing 3-5 steps with a railing? : None 6 Click Score: 24    End of Session   Activity Tolerance: Patient tolerated treatment well Patient left: Other (comment) (walking in his room) Nurse Communication: Mobility status PT Visit Diagnosis: Unsteadiness on feet (R26.81);Muscle weakness (generalized) (M62.81);Other abnormalities of gait and mobility (R26.89);Difficulty in walking, not elsewhere classified (R26.2);Pain     Time: 1610-9604 PT Time Calculation (min) (ACUTE ONLY): 30 min  Charges:    $Gait Training: 23-37 mins PT General Charges $$ ACUTE PT VISIT: 1 Visit                     Van Clines, PT  Acute Rehabilitation Services Office [phone] Secure Chat welcomed    Levi Aland 07/23/2023, 5:04 PM

## 2023-07-23 NOTE — Progress Notes (Signed)
 Speech Language Pathology Treatment: Dysphagia  Patient Details Name: Dennis [name] MRN: [id number] DOB: December 26, 1945 Today's Date: 07/23/2023 Time: 6962-9528 SLP Time Calculation (min) (ACUTE ONLY): 26 min  Assessment / Plan / Recommendation Clinical Impression  Pt seen for a second session today with focus on dysphagia with texture upgraded to regular. Observed with lunch tray of spaghetti, broccoli, chocolate chip cookie and requested soda. He intermittently appeared somewhat frustrated that therapist was observing him and explained reasoning. Pt self fed needing several cues for smaller bites which did not carry over although he managed volume without difficulty. Rate was mildly increased although he did swallow prior to taking subsequent bite. He expectorated broccoli stems x 2 however may have been combination of lack of upper dentition and possible toughness of stem. Otherwise he was able to [facility] without difficulty and clear oral cavity. Sequential straw sips soda taken throughout session without s/s aspiration. Overall he appears safe to continue a regular texture, thin liquids, meds with liquids and no further ST needed for swallow (will continue to see for communication/cognition). May need intermittent supervision for smaller bites from staff.     HPI HPI: Pt is a 78 y.o. male presented to ED after significant other noted him to be acutely more confused than his baseline and having speech difficulty. EMS noted him to have (L) facial droop and expressive aphasia as well as hypertensive en route to ED. MRI (06/21/23) revealed, "Punctate acute infarct in the left posterior subinsular white matter... small area of acute infarct in the more superior left frontal cortex. There also 2 punctate areas of diffusion hyperintensity without ADC correlate in the superior right frontal lobe which may be areas of subacute ischemia". Previous acute SLP services provided for dysphagia on 03/03/23, recommended  advancement of diet to dys 3/thin liquids given improvement in mentation and respiratory status. PMH: HLD, BPH, HTN, dementia, rheumatoid arthritis, COPD, Barrett's esophagus, OSA does not use CPAP, history of CVA.      SLP Plan  All goals met;Discharge SLP treatment due to (comment) (discharge for dysphagia)      Recommendations for follow up therapy are one component of a multi-disciplinary discharge planning process, led by the attending physician.  Recommendations may be updated based on patient status, additional functional criteria and insurance authorization.    Recommendations  Diet recommendations: Regular;Thin liquid Liquids provided via: Cup;Straw Medication Administration: Whole meds with liquid Supervision: Patient able to self feed;Intermittent supervision to cue for compensatory strategies Compensations: Slow rate;Small sips/bites;Minimize environmental distractions Postural Changes and/or Swallow Maneuvers: Seated upright 90 degrees                  Oral care BID   Intermittent Supervision/Assistance Dysphagia, unspecified (R13.10)     All goals met;Discharge SLP treatment due to (comment) (discharge for dysphagia)     Royce Macadamia  07/23/2023, 1:29 PM

## 2023-07-23 NOTE — [place] FL2 (Signed)
 [place] MEDICAID FL2 LEVEL OF CARE FORM     IDENTIFICATION  Patient Name: Dennis [name] Birthdate: [birth year] Sex: male Admission Date (Current Location): 06/21/2023  [facility] and [place] Number:  Producer, television/film/video and Address:  [facility], [address], [place], [place] [address]      Provider Number: [id number]  Attending Physician Name and Address:  Lanae Boast, MD  Relative Name and Phone Number:  DSS Marlou Porch [phone]    Current Level of Care: Hospital Recommended Level of Care: Memory Care Prior Approval Number:    Date Approved/Denied:   PASRR Number: [id number]  Discharge Plan: Other (Comment) (Memory Care)    Current Diagnoses: Patient Active Problem List   Diagnosis Date Noted   Agitation 07/10/2023   Dysphagia 07/02/2023   Dementia with behavioral disturbance (HCC) 07/02/2023   Acute CVA (cerebrovascular accident) (HCC) 06/21/2023   BPH (benign prostatic hyperplasia) 06/21/2023   Altered mental state 02/22/2023   Difficulty walking 02/22/2023   HLD (hyperlipidemia) 06/02/2009   HTN (hypertension) 06/02/2009   AORTIC INSUFFICIENCY, MILD 06/02/2009   DYSPNEA ON EXERTION 06/02/2009   CHEST PAIN, ATYPICAL 06/02/2009    Orientation RESPIRATION BLADDER Height & Weight     Self, Place  Normal Continent Weight: 132 lb 4.4 oz (60 kg) Height:  5\' 4"  (162.6 cm)  BEHAVIORAL SYMPTOMS/MOOD NEUROLOGICAL BOWEL NUTRITION STATUS      Continent Diet (Regular)  AMBULATORY STATUS COMMUNICATION OF NEEDS Skin   Supervision Verbally Normal                       Personal Care Assistance Level of Assistance  Bathing, Feeding, Dressing Bathing Assistance: Limited assistance Feeding assistance: Limited assistance Dressing Assistance: Limited assistance     Functional Limitations Info  Sight, Hearing, Speech Sight Info: Impaired Hearing Info: Impaired Speech Info: Impaired    SPECIAL CARE FACTORS FREQUENCY                        Contractures Contractures Info: Not present    Additional Factors Info  Code Status, Allergies, Psychotropic Code Status Info: Full Allergies Info: Codeine, Shellfish Psychotropic Info: Quetiapine         Current Medications (07/23/2023):  This is the current hospital active medication list Current Facility-Administered Medications  Medication Dose Route Frequency Provider Last Rate Last Admin   albuterol (PROVENTIL) (2.5 MG/3ML) 0.083% nebulizer solution 2.5 mg  2.5 mg Nebulization Q4H PRN Foust, Katy L, NP       apixaban (ELIQUIS) tablet 5 mg  5 mg Oral BID Reome, Earle J, RPH   5 mg at 07/23/23 1011   atorvastatin (LIPITOR) tablet 80 mg  80 mg Oral Daily Briant Cedar, MD   80 mg at 07/23/23 1010   carvedilol (COREG) tablet 3.125 mg  3.125 mg Oral BID WC Jonah Blue, MD   3.125 mg at 07/23/23 8413   docusate sodium (COLACE) capsule 100 mg  100 mg Oral BID Pokhrel, Laxman, MD   100 mg at 07/23/23 1011   finasteride (PROSCAR) tablet 5 mg  5 mg Oral Daily Foust, Katy L, NP   5 mg at 07/23/23 1011   hydrALAZINE (APRESOLINE) injection 10 mg  10 mg Intravenous Q4H PRN Briant Cedar, MD   10 mg at 06/27/23 0547   isosorbide mononitrate (IMDUR) 24 hr tablet 30 mg  30 mg Oral Daily Dorcas Carrow, MD  30 mg at 07/23/23 1011   lidocaine (LIDODERM) 5 % 1 patch  1 patch Transdermal Q24H Dorcas Carrow, MD   1 patch at 07/22/23 1430   lip balm (CARMEX) ointment   Topical PRN Pokhrel, Laxman, MD       melatonin tablet 5 mg  5 mg Oral QHS PRN Foust, Katy L, NP   5 mg at 07/22/23 2234   Oral care mouth rinse  15 mL Mouth Rinse PRN Jonah Blue, MD       pantoprazole (PROTONIX) EC tablet 40 mg  40 mg Oral Daily Foust, Katy L, NP   40 mg at 07/23/23 1011   polyethylene glycol (MIRALAX / GLYCOLAX) packet 17 g  17 g Oral Daily Pokhrel, Laxman, MD   17 g at 07/23/23 1011   polyvinyl alcohol (LIQUIFILM TEARS) 1.4 % ophthalmic solution 2 drop  2 drop Both Eyes  PRN Dorcas Carrow, MD       QUEtiapine (SEROQUEL) tablet 25 mg  25 mg Oral Wille Celeste, MD   25 mg at 07/23/23 0602   QUEtiapine (SEROQUEL) tablet 50 mg  50 mg Oral q1800 Jonah Blue, MD   50 mg at 07/22/23 1740   senna-docusate (Senokot-S) tablet 1 tablet  1 tablet Oral QHS PRN Foust, Katy L, NP       tamsulosin (FLOMAX) capsule 0.4 mg  0.4 mg Oral Daily Foust, Katy L, NP   0.4 mg at 07/23/23 1011     Discharge Medications: Please see discharge summary for a list of discharge medications.  Relevant Imaging Results:  Relevant Lab Results:   Additional Information SS# [id number], [place]

## 2023-07-23 NOTE — Progress Notes (Addendum)
 PROGRESS NOTE IZEL EISENHARDT  RUE:[id number] DOB: 06-Dec-1945 DOA: 06/21/2023 PCP: Pcp, No  Brief Narrative/Hospital Course: 78 years old male with past medical history of hyperlipidemia, BPH, HTN, dementia, RA, COPD, OSA not on CPAP, and CVA who presented to the hospital on 2/1 with mental status and dysarthria. He was found to have an acute infarct in the L posterior subinsular white matter on MRI. He and his significant other both have dementia, awaiting guardianship from social services.   Subjective: Seen and examined this morning Resting comfortably in the bedside chair alert awake oriented to self current place, unable to answer any the current president's name or current date asking " how long am I staying here? Overnight vitals stable BP 160s, on room air afebrile.  Labs reviewed from 2/23 creatinine 1.3 with EGFR 54, chronic anemia hemoglobin 11.4. Currently compliant with Eliquis twice daily, Coreg/Imdur, Lipitor 80, PPI, Proscar/Flomax, Seroquel 25 AM and 50 p.m., needing melatonin bedtime as needed   Assessment and Plan: Principal Problem:   Acute CVA (cerebrovascular accident) (HCC) Active Problems:   HLD (hyperlipidemia)   HTN (hypertension)   BPH (benign prostatic hyperplasia)   Dysphagia   Dementia with behavioral disturbance (HCC)   Agitation   Acute CVA Presented with AMS and dysarthria.Continue Eliquis and Lipitor.  PT OT recommended skilled nursing facility placement.  Communicative but disoriented.  Awaiting for placement   HTN BP stable continue Coreg/Imdur  HLD: Continue Lipitor   Dysphagia Underwent MBS on 2/11 with significantly improved swallow function associated with improved cognition.  Tolerating regular diet.   Dementia Mood is stable alert awake pleasant. Seroquel 25 AM and 50 p.m., Seen by psychiatry in consultation.   HFrEF Euvolemic.  Continue Coreg, Imdur  HIS LVEF was  25% with global hypokinesis.Continue Imdur.   BPH Continue  Proscar/Flomax    Stage 3a CKD Last labs w/ stable renal function.     Disposition. He lived independently with his wife PTA and is not currently safe to return home (his wife also has dementia so DSS is seeking guardianship for them both). Once guardianship is obtained, he will need placement. DSS is also working on LTC Medicaid for him  DVT prophylaxis: eliquis Code Status:   Code Status: Full Code Family Communication: plan of care discussed with patient at bedside. Patient status is: Remains hospitalized because of severity of illness Level of care: Med-Surg   Dispo: The patient is from: ILF            Anticipated disposition: snf pending Objective: Vitals last 24 hrs: Vitals:   07/22/23 1931 07/23/23 0504 07/23/23 0752 07/23/23 0832  BP: (!) 168/70 (!) 124/55 (!) 166/85 (!) 166/85  Pulse: 94 84 87 87  Resp: 18 16 18    Temp: 98 F (36.7 C) 98 F (36.7 C) 98.2 F (36.8 C)   TempSrc: Oral Oral Oral   SpO2: 96% 98% 95%   Weight:      Height:       Weight change:   Physical Examination: General exam: alert awake,at baseline, older than stated age HEENT:Oral mucosa moist, Ear/Nose WNL grossly Respiratory system: Bilaterally clear BS,no use of accessory muscle Cardiovascular system: S1 & S2 +, No JVD. Gastrointestinal system: Abdomen soft,NT,ND, BS+ Nervous System: Alert, awake, oriented to self current place moving all extremities,and following commands. Extremities: LE edema neg,distal peripheral pulses palpable and warm.  Skin: No rashes,no icterus. MSK: Normal muscle bulk,tone, power   Medications reviewed:  Scheduled Meds:  apixaban  5 mg  Oral BID   atorvastatin  80 mg Oral Daily   carvedilol  3.125 mg Oral BID WC   docusate sodium  100 mg Oral BID   finasteride  5 mg Oral Daily   isosorbide mononitrate  30 mg Oral Daily   lidocaine  1 patch Transdermal Q24H   pantoprazole  40 mg Oral Daily   polyethylene glycol  17 g Oral Daily   QUEtiapine  25 mg Oral  BH-q7a   QUEtiapine  50 mg Oral q1800   tamsulosin  0.4 mg Oral Daily  Continuous Infusions:   Diet Order             Diet regular Room service appropriate? No; Fluid consistency: Thin  Diet effective now                  Intake/Output Summary (Last 24 hours) at 07/23/2023 1342 Last data filed at 07/23/2023 0753 Gross per 24 hour  Intake 840 ml  Output --  Net 840 ml   Net IO Since Admission: -12,647.97 mL [07/23/23 1342]  Wt Readings from Last 3 Encounters:  06/21/23 60 kg  06/07/23 70 kg  02/22/23 68 kg     Unresulted Labs (From admission, onward)    None     Data Reviewed: I have personally reviewed following labs and imaging studies CBC:No results for input(s): "WBC", "NEUTROABS", "HGB", "HCT", "MCV", "PLT" in the last 168 hours. Basic Metabolic Panel: No results for input(s): "NA", "K", "CL", "CO2", "GLUCOSE", "BUN", "CREATININE", "CALCIUM", "MG", "PHOS" in the last 168 hours. GFR: Estimated Creatinine Clearance: 37.2 mL/min (A) (by C-G formula based on SCr of 1.35 mg/dL (H)). Liver Function Tests:  No results for input(s): "AST", "ALT", "ALKPHOS", "BILITOT", "PROT", "ALBUMIN" in the last 168 hours. No results for input(s): "LIPASE", "AMYLASE" in the last 168 hours. No results for input(s): "AMMONIA" in the last 168 hours. Coagulation Profile: No results for input(s): "INR", "PROTIME" in the last 168 hours. No results for input(s): "PROBNP" in the last 168 hours.  No results for input(s): "HGBA1C" in the last 72 hours. No results for input(s): "GLUCAP" in the last 168 hours. No results for input(s): "CHOL", "HDL", "LDLCALC", "TRIG", "CHOLHDL", "LDLDIRECT" in the last 72 hours. No results for input(s): "TSH", "T4TOTAL", "FREET4", "T3FREE", "THYROIDAB" in the last 72 hours. Sepsis Labs: No results for input(s): "PROCALCITON", "LATICACIDVEN" in the last 168 hours. No results found for this or any previous visit (from the past 240 hours).   Antimicrobials/Microbiology: Anti-infectives (From admission, onward)    None         Component Value Date/Time   SDES CSF 02/24/2023 1418   SPECREQUEST NONE 02/24/2023 1418   CULT  02/24/2023 1418    NO GROWTH 3 DAYS Performed at [facility], [address] [place], [place] [address]    REPTSTATUS 03/01/2023 FINAL 02/24/2023 1418   Radiology Studies: No results found.   LOS: 31 days   Total time spent in review of labs and imaging, patient evaluation, formulation of plan, documentation and communication with family: 25 minutes  Lanae Boast, MD  [facility]  07/23/2023, 1:42 PM

## 2023-07-23 NOTE — Plan of Care (Signed)

## 2023-07-24 DIAGNOSIS — I639 Cerebral infarction, unspecified: Secondary | ICD-10-CM | POA: Diagnosis not present

## 2023-07-24 NOTE — Progress Notes (Signed)
 PROGRESS NOTE Dennis [name]  GNF:[id number] DOB: [birth year] DOA: 06/21/2023 PCP: Pcp, No  Brief Narrative/Hospital Course: 78 years old male with past medical history of hyperlipidemia, BPH, HTN, dementia, RA, COPD, OSA not on CPAP, and CVA who presented to the hospital on 2/1 with mental status and dysarthria. He was found to have an acute infarct in the L posterior subinsular white matter on MRI. He and his significant other both have dementia, awaiting guardianship from social services.   Subjective: Seen and examined this morning  Ambulating in the room, has no complaints  asking " how long am I staying here?" Labs reviewed from 3/5 and stable  Currently compliant with Eliquis twice daily, Coreg/Imdur, Lipitor 80, PPI, Proscar/Flomax, Seroquel 25 AM and 50 p.m., needing melatonin bedtime as needed   Assessment and Plan: Principal Problem:   Acute CVA (cerebrovascular accident) (HCC) Active Problems:   HLD (hyperlipidemia)   HTN (hypertension)   BPH (benign prostatic hyperplasia)   Dysphagia   Dementia with behavioral disturbance (HCC)   Agitation   Acute CVA-acute infarct in the superior left frontal cortex, punctuate acute infarct in the left posterior subinsular white matter: Presented with AMS and dysarthria.neurologically doing okay ambulating well.  Completed a stroke workup A1c 5.4 LDL 112 target less than 70 Patient had CT head MRI brain and CT angio head and neck-no LVO, 60% stenosis right proximal ICA and 50% on the left proximal ICA Echo showed EF 20-25%. Continue Eliquis and Lipitor and PTOT and awaiting for skilled nursing facility. Neurology is advised to repeat echo in 3 months, and continue Eliquis until EF more than 30-35%.   HTN BP well-controlled on Coreg/Imdur  HLD: Continue Lipitor   Dysphagia Underwent MBS on 2/11 with significantly improved swallow function associated with improved cognition.  Tolerating regular diet.   Dementia Mood is stable alert  awake pleasant. Seroquel 25 AM and 50 p.m., Seen by psychiatry in consultation.   HFrEF Euvolemic. Continue Coreg, Imdur. His lvef 20- 25% with global hypokinesis.Marland Kitchen   BPH Continue Proscar/Flomax    Stage 3a CKD Last labs w/ stable renal function.  Monitor intermittently   Disposition. He lived independently with his wife PTA and is not currently safe to return home (his wife also has dementia so DSS is seeking guardianship for them both). Once guardianship is obtained, he will need placement. DSS is also working on LTC Medicaid for him  DVT prophylaxis: eliquis Code Status:   Code Status: Full Code Family Communication: plan of care discussed with patient at bedside. Patient status is: Remains hospitalized because of severity of illness Level of care: Med-Surg   Dispo: The patient is from: ILF            Anticipated disposition: snf pending.  QuantiFERON gold test ordered 3/5 for SNF Objective: Vitals last 24 hrs: Vitals:   07/23/23 1731 07/23/23 1941 07/24/23 0540 07/24/23 0908  BP: (!) 147/78 (!) 159/77 127/61 (!) 144/60  Pulse: 94 80 75 90  Resp:  18 18 18   Temp:  98.2 F (36.8 C) 97.9 F (36.6 C) (!) 97.5 F (36.4 C)  TempSrc:  Oral Oral Oral  SpO2:  98% 94% 94%  Weight:      Height:       Weight change:   Physical Examination: General exam: alert awake, oriented to self/place  HEENT:Oral mucosa moist, Ear/Nose WNL grossly Respiratory system: Bilaterally clear BS,no use of accessory muscle Cardiovascular system: S1 & S2 +, No JVD. Gastrointestinal system: Abdomen soft,NT,ND,  BS+ Nervous System: Alert, awake, moving all extremities,and following commands. Extremities: LE edema neg,distal peripheral pulses palpable and warm.  Skin: No rashes,no icterus. MSK: Normal muscle bulk,tone, power   Medications reviewed:  Scheduled Meds:  apixaban  5 mg Oral BID   atorvastatin  80 mg Oral Daily   carvedilol  3.125 mg Oral BID WC   docusate sodium  100 mg Oral BID    finasteride  5 mg Oral Daily   isosorbide mononitrate  30 mg Oral Daily   lidocaine  1 patch Transdermal Q24H   pantoprazole  40 mg Oral Daily   polyethylene glycol  17 g Oral Daily   QUEtiapine  25 mg Oral BH-q7a   QUEtiapine  50 mg Oral q1800   tamsulosin  0.4 mg Oral Daily  Continuous Infusions:   Diet Order             Diet regular Room service appropriate? No; Fluid consistency: Thin  Diet effective now                  Intake/Output Summary (Last 24 hours) at 07/24/2023 1127 Last data filed at 07/24/2023 0900 Gross per 24 hour  Intake 1320 ml  Output --  Net 1320 ml   Net IO Since Admission: -11,327.97 mL [07/24/23 1127]  Wt Readings from Last 3 Encounters:  06/21/23 60 kg  06/07/23 70 kg  02/22/23 68 kg     Unresulted Labs (From admission, onward)     Start     Ordered   07/23/23 1347  QuantiFERON-TB Gold Plus  Once,   R       Question:  Specimen collection method  Answer:  Lab=Lab collect   07/23/23 1346          Data Reviewed: I have personally reviewed following labs and imaging studies CBC: Recent Labs  Lab 07/23/23 1459  WBC 8.4  HGB 11.3*  HCT 36.5*  MCV 88.4  PLT 256   Basic Metabolic Panel:  Recent Labs  Lab 07/23/23 1459  NA 140  K 4.6  CL 107  CO2 24  GLUCOSE 114*  BUN 17  CREATININE 1.34*  CALCIUM 9.5   GFR: Estimated Creatinine Clearance: 37.4 mL/min (A) (by C-G formula based on SCr of 1.34 mg/dL (H)). Liver Function Tests:  Recent Labs  Lab 07/23/23 1459  AST 24  ALT 23  ALKPHOS 26*  BILITOT 0.4  PROT 6.6  ALBUMIN 3.1*   No results for input(s): "LIPASE", "AMYLASE" in the last 168 hours. No results for input(s): "AMMONIA" in the last 168 hours. Coagulation Profile: No results for input(s): "INR", "PROTIME" in the last 168 hours. No results for input(s): "PROBNP" in the last 168 hours.  No results for input(s): "HGBA1C" in the last 72 hours. No results for input(s): "GLUCAP" in the last 168 hours. No results for  input(s): "CHOL", "HDL", "LDLCALC", "TRIG", "CHOLHDL", "LDLDIRECT" in the last 72 hours. No results for input(s): "TSH", "T4TOTAL", "FREET4", "T3FREE", "THYROIDAB" in the last 72 hours. Sepsis Labs: No results for input(s): "PROCALCITON", "LATICACIDVEN" in the last 168 hours. No results found for this or any previous visit (from the past 240 hours).  Antimicrobials/Microbiology: Anti-infectives (From admission, onward)    None         Component Value Date/Time   SDES CSF 02/24/2023 1418   SPECREQUEST NONE 02/24/2023 1418   CULT  02/24/2023 1418    NO GROWTH 3 DAYS Performed at [facility], [address]  [place], [place] [address]    REPTSTATUS 03/01/2023 FINAL 02/24/2023 1418   Radiology Studies: No results found.   LOS: 32 days   Total time spent in review of labs and imaging, patient evaluation, formulation of plan, documentation and communication with family: 35 minutes  Lanae Boast, MD  [facility]  07/24/2023, 11:27 AM

## 2023-07-24 NOTE — Plan of Care (Signed)
  Problem: Activity: Goal: Risk for activity intolerance will decrease Outcome: Progressing   Problem: Health Behavior/Discharge Planning: Goal: Ability to manage health-related needs will improve Outcome: Progressing   Problem: Coping: Goal: Level of anxiety will decrease Outcome: Progressing   Problem: Safety: Goal: Ability to remain free from injury will improve Outcome: Progressing

## 2023-07-24 NOTE — Progress Notes (Signed)
 Mobility Specialist Progress Note:   07/24/23 1107  Mobility  Activity Ambulated with assistance in hallway  Level of Assistance Standby assist, set-up cues, supervision of patient - no hands on  Assistive Device Cane  Distance Ambulated (ft) 450 ft  Activity Response Tolerated well  Mobility Referral Yes  Mobility visit 1 Mobility  Mobility Specialist Start Time (ACUTE ONLY) 1035  Mobility Specialist Stop Time (ACUTE ONLY) 1050  Mobility Specialist Time Calculation (min) (ACUTE ONLY) 15 min   Pt received in hallway, agreeable for MS to join. Denied any discomfort during ambulation, asx throughout. Pt returned back to room with all needs met.   [name]  Mobility Specialist Please contact via [employer] office at [phone]

## 2023-07-24 NOTE — Plan of Care (Signed)
   Problem: Clinical Measurements: Goal: Ability to maintain clinical measurements within normal limits will improve Outcome: Progressing

## 2023-07-25 DIAGNOSIS — I639 Cerebral infarction, unspecified: Secondary | ICD-10-CM | POA: Diagnosis not present

## 2023-07-25 NOTE — TOC Progression Note (Signed)
 Transition of Care [facility]) - Progression Note    Patient Details  Name: RHYS LICHTY MRN: [id number] Date of Birth: December 23, 1945  [facility] CM/SW Contact  Carley Hammed, LCSW Phone Number: 07/25/2023, 1:34 PM  Clinical Narrative:     [name] and DSS working on arranging placement. CSW to follow up on Progress on Monday. Possible DC early next week. TOC will continue to follow.   Expected Discharge Plan: Skilled Nursing Facility Barriers to Discharge: Insurance Authorization, SNF Pending bed offer, Continued Medical Work up, Unsafe home situation  Expected Discharge Plan and Services     Post Acute Care Choice: Skilled Nursing Facility Living arrangements for the past 2 months: Single Family Home                                       Social Determinants of Health (SDOH) Interventions SDOH Screenings   Food Insecurity: No Food Insecurity (06/21/2023)  Housing: Low Risk  (06/21/2023)  Transportation Needs: No Transportation Needs (06/21/2023)  Utilities: Not At Risk (06/21/2023)  Social Connections: Moderately Isolated (06/21/2023)  Tobacco Use: Medium Risk (06/21/2023)    Readmission Risk Interventions     No data to display

## 2023-07-25 NOTE — Progress Notes (Signed)
 PROGRESS NOTE Dennis [name]  WUJ:[id number] DOB: 14-Apr-1946 DOA: 06/21/2023 PCP: Pcp, No  Brief Narrative/Hospital Course: 78 years old male with past medical history of hyperlipidemia, BPH, HTN, dementia, RA, COPD, OSA not on CPAP, and CVA who presented to the hospital on 2/1 with mental status and dysarthria. He was found to have an acute infarct in the L posterior subinsular white matter on MRI. He and his significant other both have dementia, awaiting guardianship from social services.   Subjective:  Seen and examined this morning ambulating in the hallway nursing watching him, no complaints Currently compliant with Eliquis twice daily, Coreg/Imdur, Lipitor 80, PPI, Proscar/Flomax, Seroquel 25 AM and 50 p.m., needing melatonin bedtime as needed   Assessment and Plan: Principal Problem:   Acute CVA (cerebrovascular accident) (HCC) Active Problems:   HLD (hyperlipidemia)   HTN (hypertension)   BPH (benign prostatic hyperplasia)   Dysphagia   Dementia with behavioral disturbance (HCC)   Agitation   Acute CVA-acute infarct in the superior left frontal cortex, punctuate acute infarct in the left posterior subinsular white matter: Presented with AMS and dysarthria.neurologically doing okay ambulating well.  Completed a stroke workup A1c 5.4 LDL 112 target less than 70. Patient had CT head MRI brain and CT angio head and neck-no LVO, 60% stenosis right proximal ICA and 50% on the left proximal ICA. Echo showed EF 20-25%. Continue Eliquis and Lipitor and PTOT and awaiting for skilled nursing facility. Neurology is advised to repeat echo in 3 months, and continue Eliquis until EF more than 30-35%.   HTN BP well-controlled on Coreg/Imdur  HLD: Continue Lipitor   Dysphagia Underwent MBS on 2/11 with significantly improved swallow function associated with improved cognition.  Tolerating regular diet.   Dementia Mood is stable alert awake pleasant. Seroquel 25 AM and 50 p.m., Seen by  psychiatry in consultation.   HFrEF Euvolemic. Continue Coreg, Imdur. His lvef 20- 25% with global hypokinesis.Marland Kitchen   BPH Continue Proscar/Flomax    Stage 3a CKD Last labs w/ stable renal function.  Monitor intermittently   Disposition. He lived independently with his wife PTA and is not currently safe to return home (his wife also has dementia so DSS is seeking guardianship for them both). Once guardianship is obtained, he will need placement. DSS is also working on LTC Medicaid for him  DVT prophylaxis: eliquis Code Status:   Code Status: Full Code Family Communication: plan of care discussed with patient at bedside. Patient status is: Remains hospitalized because of severity of illness Level of care: Med-Surg   Dispo: The patient is from: ILF            Anticipated disposition: snf pending.  QuantiFERON gold test ordered 3/5 for SNF Objective: Vitals last 24 hrs: Vitals:   07/24/23 1601 07/24/23 2016 07/25/23 0453 07/25/23 0854  BP: 125/72 (!) 146/73 (!) 147/77 (!) 145/75  Pulse: 93 91 72 92  Resp: 16 20 18 17   Temp: 98.1 F (36.7 C) 97.7 F (36.5 C)  97.9 F (36.6 C)  TempSrc: Oral Oral  Oral  SpO2: 94% 95% 94% 92%  Weight:      Height:       Weight change:   Physical Examination: General exam: alert awake, oriented to self place HEENT:Oral mucosa moist, Ear/Nose WNL grossly Respiratory system: Bilaterally clear BS,no use of accessory muscle Cardiovascular system: S1 & S2 +, No JVD. Gastrointestinal system: Abdomen soft,NT,ND, BS+ Nervous System: Alert, awake, moving all extremities,and following commands. Extremities: LE edema neg,distal peripheral  pulses palpable and warm.  Skin: No rashes,no icterus. MSK: Normal muscle bulk,tone, power   Medications reviewed:  Scheduled Meds:  apixaban  5 mg Oral BID   atorvastatin  80 mg Oral Daily   carvedilol  3.125 mg Oral BID WC   docusate sodium  100 mg Oral BID   finasteride  5 mg Oral Daily   isosorbide  mononitrate  30 mg Oral Daily   lidocaine  1 patch Transdermal Q24H   pantoprazole  40 mg Oral Daily   polyethylene glycol  17 g Oral Daily   QUEtiapine  25 mg Oral BH-q7a   QUEtiapine  50 mg Oral q1800   tamsulosin  0.4 mg Oral Daily  Continuous Infusions:   Diet Order             Diet regular Room service appropriate? No; Fluid consistency: Thin  Diet effective now                  Intake/Output Summary (Last 24 hours) at 07/25/2023 1136 Last data filed at 07/25/2023 0855 Gross per 24 hour  Intake 220 ml  Output --  Net 220 ml   Net IO Since Admission: -11,107.97 mL [07/25/23 1136]  Wt Readings from Last 3 Encounters:  06/21/23 60 kg  06/07/23 70 kg  02/22/23 68 kg     Unresulted Labs (From admission, onward)     Start     Ordered   07/23/23 1347  QuantiFERON-TB Gold Plus  Once,   R       Question:  Specimen collection method  Answer:  Lab=Lab collect   07/23/23 1346          Data Reviewed: I have personally reviewed following labs and imaging studies CBC: Recent Labs  Lab 07/23/23 1459  WBC 8.4  HGB 11.3*  HCT 36.5*  MCV 88.4  PLT 256   Basic Metabolic Panel:  Recent Labs  Lab 07/23/23 1459  NA 140  K 4.6  CL 107  CO2 24  GLUCOSE 114*  BUN 17  CREATININE 1.34*  CALCIUM 9.5   GFR: Estimated Creatinine Clearance: 37.4 mL/min (A) (by C-G formula based on SCr of 1.34 mg/dL (H)). Liver Function Tests:  Recent Labs  Lab 07/23/23 1459  AST 24  ALT 23  ALKPHOS 26*  BILITOT 0.4  PROT 6.6  ALBUMIN 3.1*  Sepsis Labs: No results for input(s): "PROCALCITON", "LATICACIDVEN" in the last 168 hours. No results found for this or any previous visit (from the past 240 hours).  Antimicrobials/Microbiology: Anti-infectives (From admission, onward)    None         Component Value Date/Time   SDES CSF 02/24/2023 1418   SPECREQUEST NONE 02/24/2023 1418   CULT  02/24/2023 1418    NO GROWTH 3 DAYS Performed at [facility], [address] [place], [place] [address]    REPTSTATUS 03/01/2023 FINAL 02/24/2023 1418   Radiology Studies: No results found.   LOS: 33 days   Total time spent in review of labs and imaging, patient evaluation, formulation of plan, documentation and communication with family: 35 minutes  Lanae Boast, MD  [facility]  07/25/2023, 11:36 AM

## 2023-07-25 NOTE — Plan of Care (Signed)
  Problem: Health Behavior/Discharge Planning: Goal: Ability to manage health-related needs will improve Outcome: Progressing   Problem: Activity: Goal: Risk for activity intolerance will decrease Outcome: Progressing   Problem: Coping: Goal: Level of anxiety will decrease Outcome: Progressing   Problem: Pain Managment: Goal: General experience of comfort will improve and/or be controlled Outcome: Progressing   Problem: Safety: Goal: Ability to remain free from injury will improve Outcome: Progressing

## 2023-07-26 DIAGNOSIS — I639 Cerebral infarction, unspecified: Secondary | ICD-10-CM | POA: Diagnosis not present

## 2023-07-26 NOTE — Progress Notes (Signed)
 PROGRESS NOTE Dennis [name]  EAV:[id number] DOB: 1945-12-12 DOA: 06/21/2023 PCP: Pcp, No  Brief Narrative/Hospital Course: 78 years old male with past medical history of hyperlipidemia, BPH, HTN, dementia, RA, COPD, OSA not on CPAP, and CVA who presented to the hospital on 2/1 with mental status and dysarthria. He was found to have an acute infarct in the L posterior subinsular white matter on MRI. He and his significant other both have dementia, awaiting guardianship from social services.   Subjective: Seen and examined Overnight afebrile BP stable Pending insurance authorization and SNF bed offers   Assessment and Plan: Principal Problem:   Acute CVA (cerebrovascular accident) (HCC) Active Problems:   HLD (hyperlipidemia)   HTN (hypertension)   BPH (benign prostatic hyperplasia)   Dysphagia   Dementia with behavioral disturbance (HCC)   Agitation   Acute CVA-acute infarct in the superior left frontal cortex, punctuate acute infarct in the left posterior subinsular white matter: Presented with AMS and dysarthria.neurologically doing okay ambulating well.  Completed a stroke workup A1c 5.4 LDL 112 target less than 70. Patient had CT head MRI brain and CT angio head and neck-no LVO, 60% stenosis right proximal ICA and 50% on the left proximal ICA. Echo showed EF 20-25%. Continue Eliquis and Lipitor and PTOT and awaiting for skilled nursing facility. Neurology is advised to repeat echo in 3 months, and continue Eliquis until EF more than 30-35%.   HTN BP well-controlled continue Coreg/Imdur  HLD: Continue Lipitor   Dysphagia Underwent MBS on 2/11 with significantly improved swallow function associated with improved cognition.  Tolerating regular diet.   Dementia Mood is stable alert awake pleasant. Seroquel 25 AM and 50 p.m., Seen by psychiatry in consultation.   HFrEF Euvolemic. Continue Coreg, Imdur. His lvef 20- 25% with global hypokinesis.Marland Kitchen   BPH Continue Proscar/Flomax     Stage 3a CKD Last labs w/ stable renal function.  Monitor intermittently   Disposition. He lived independently with his wife PTA and is not currently safe to return home (his wife also has dementia so DSS is seeking guardianship for them both). Once guardianship is obtained, he will need placement. DSS is also working on LTC Medicaid for him  DVT prophylaxis: eliquis Code Status:   Code Status: Full Code Family Communication: plan of care discussed with patient at bedside. Patient status is: Remains hospitalized because of severity of illness Level of care: Med-Surg   Dispo: The patient is from: ILF            Anticipated disposition: snf pending.  QuantiFERON gold test ordered 3/5 for SNF Objective: Vitals last 24 hrs: Vitals:   07/25/23 0854 07/25/23 1943 07/26/23 0246 07/26/23 0838  BP: (!) 145/75 129/70 (!) 111/51 (!) 157/70  Pulse: 92 95 71 86  Resp: 17 18 16 18   Temp: 97.9 F (36.6 C) 98.2 F (36.8 C) 98.3 F (36.8 C) 97.8 F (36.6 C)  TempSrc: Oral Oral Oral Oral  SpO2: 92% 96% 95% 95%  Weight:      Height:       Weight change:   Physical Examination: General exam: alert awake, oriented x 2 HEENT:Oral mucosa moist, Ear/Nose WNL grossly Respiratory system: Bilaterally clear BS,no use of accessory muscle Cardiovascular system: S1 & S2 +, No JVD. Gastrointestinal system: Abdomen soft,NT,ND, BS+ Nervous System: Alert, awake, moving all extremities,and following commands. Extremities: LE edema neg,distal peripheral pulses palpable and warm.  Skin: No rashes,no icterus. MSK: Normal muscle bulk,tone, power   Medications reviewed:  Scheduled Meds:  apixaban  5 mg Oral BID   atorvastatin  80 mg Oral Daily   carvedilol  3.125 mg Oral BID WC   docusate sodium  100 mg Oral BID   finasteride  5 mg Oral Daily   isosorbide mononitrate  30 mg Oral Daily   lidocaine  1 patch Transdermal Q24H   pantoprazole  40 mg Oral Daily   polyethylene glycol  17 g Oral Daily    QUEtiapine  25 mg Oral BH-q7a   QUEtiapine  50 mg Oral q1800   tamsulosin  0.4 mg Oral Daily  Continuous Infusions:   Diet Order             Diet regular Room service appropriate? No; Fluid consistency: Thin  Diet effective now                  Intake/Output Summary (Last 24 hours) at 07/26/2023 1023 Last data filed at 07/25/2023 2030 Gross per 24 hour  Intake 240 ml  Output --  Net 240 ml   Net IO Since Admission: -10,867.97 mL [07/26/23 1023]  Wt Readings from Last 3 Encounters:  06/21/23 60 kg  06/07/23 70 kg  02/22/23 68 kg     Unresulted Labs (From admission, onward)     Start     Ordered   07/23/23 1347  QuantiFERON-TB Gold Plus  Once,   R       Question:  Specimen collection method  Answer:  Lab=Lab collect   07/23/23 1346          Data Reviewed: I have personally reviewed following labs and imaging studies CBC: Recent Labs  Lab 07/23/23 1459  WBC 8.4  HGB 11.3*  HCT 36.5*  MCV 88.4  PLT 256   Basic Metabolic Panel:  Recent Labs  Lab 07/23/23 1459  NA 140  K 4.6  CL 107  CO2 24  GLUCOSE 114*  BUN 17  CREATININE 1.34*  CALCIUM 9.5   GFR: Estimated Creatinine Clearance: 37.4 mL/min (A) (by C-G formula based on SCr of 1.34 mg/dL (H)). Liver Function Tests:  Recent Labs  Lab 07/23/23 1459  AST 24  ALT 23  ALKPHOS 26*  BILITOT 0.4  PROT 6.6  ALBUMIN 3.1*  Sepsis Labs: No results for input(s): "PROCALCITON", "LATICACIDVEN" in the last 168 hours. No results found for this or any previous visit (from the past 240 hours).  Antimicrobials/Microbiology: Anti-infectives (From admission, onward)    None         Component Value Date/Time   SDES CSF 02/24/2023 1418   SPECREQUEST NONE 02/24/2023 1418   CULT  02/24/2023 1418    NO GROWTH 3 DAYS Performed at [facility], [address], [place], [place] [address]    REPTSTATUS 03/01/2023 FINAL 02/24/2023 1418   Radiology Studies: No results found.   LOS: 34 days   Total time  spent in review of labs and imaging, patient evaluation, formulation of plan, documentation and communication with family: 25 minutes  Lanae Boast, MD  [facility]  07/26/2023, 10:23 AM

## 2023-07-26 NOTE — Plan of Care (Signed)
  Problem: Activity: Goal: Risk for activity intolerance will decrease Outcome: Progressing   Problem: Nutrition: Goal: Adequate nutrition will be maintained Outcome: Progressing   Problem: Safety: Goal: Ability to remain free from injury will improve Outcome: Progressing   Problem: Coping: Goal: Level of anxiety will decrease Outcome: Not Progressing

## 2023-07-27 LAB — QUANTIFERON-TB GOLD PLUS: QuantiFERON-TB Gold Plus: UNDETERMINED — AB

## 2023-07-27 LAB — QUANTIFERON-TB GOLD PLUS (RQFGPL)
QuantiFERON Mitogen Value: 0.09 [IU]/mL
QuantiFERON Nil Value: 0.01 [IU]/mL
QuantiFERON TB1 Ag Value: 0.01 [IU]/mL
QuantiFERON TB2 Ag Value: 0.04 [IU]/mL

## 2023-07-27 NOTE — Progress Notes (Signed)
 Patient seen this morning No acute events overnight No change in plan of care Awaiting for placement at this time

## 2023-07-27 NOTE — Plan of Care (Signed)
  Problem: Activity: Goal: Risk for activity intolerance will decrease Outcome: Progressing   Problem: Nutrition: Goal: Adequate nutrition will be maintained Outcome: Progressing   Problem: Safety: Goal: Ability to remain free from injury will improve Outcome: Progressing   Problem: Coping: Goal: Level of anxiety will decrease Outcome: Not Progressing

## 2023-07-27 NOTE — Plan of Care (Signed)
  Problem: Activity: Goal: Risk for activity intolerance will decrease Outcome: Progressing   Problem: Nutrition: Goal: Adequate nutrition will be maintained Outcome: Progressing   Problem: Elimination: Goal: Will not experience complications related to urinary retention Outcome: Progressing   Problem: Safety: Goal: Ability to remain free from injury will improve Outcome: Progressing   Problem: Health Behavior/Discharge Planning: Goal: Ability to manage health-related needs will improve Outcome: Not Progressing

## 2023-07-28 DIAGNOSIS — I639 Cerebral infarction, unspecified: Secondary | ICD-10-CM | POA: Diagnosis not present

## 2023-07-28 NOTE — Progress Notes (Signed)
 PROGRESS NOTE Dennis [name]  WUJ:[id number] DOB: 1945-10-30 DOA: 06/21/2023 PCP: Pcp, No  Brief Narrative/Hospital Course: 78 years old male with past medical history of hyperlipidemia, BPH, HTN, dementia, RA, COPD, OSA not on CPAP, and CVA who presented to the hospital on 2/1 with mental status and dysarthria. He was found to have an acute infarct in the L posterior subinsular white matter on MRI. He and his significant other both have dementia, awaiting guardianship from social services.   Subjective: Alert oriented resting comfortably appears pleasant  Calm  Asking " how long?"  assessment and Plan: Principal Problem:   Acute CVA (cerebrovascular accident) (HCC) Active Problems:   HLD (hyperlipidemia)   HTN (hypertension)   BPH (benign prostatic hyperplasia)   Dysphagia   Dementia with behavioral disturbance (HCC)   Agitation   Acute CVA-acute infarct in the superior left frontal cortex, punctuate acute infarct in the left posterior subinsular white matter: Presented with AMS and dysarthria.neurologically doing okay ambulating well.  Completed a stroke workup A1c 5.4 LDL 112 target less than 70. Patient had CT head MRI brain and CT angio head and neck-no LVO, 60% stenosis right proximal ICA and 50% on the left proximal ICA. Echo showed EF 20-25%. Continue Eliquis and Lipitor and PTOT and awaiting for skilled nursing facility. Neurologically intact, alert and ambulating. Neuro asvised to repeat echo in 3 months, and continue Eliquis until EF more than 30-35%.   HTN BP well-controlled continue Coreg/Imdur  HLD: Continue Lipitor   Dysphagia Underwent MBS on 2/11 with significantly improved swallow function associated with improved cognition.  Tolerating regular diet.   Dementia Mood is stable alert awake pleasant. Seroquel 25 AM and 50 p.m., Seen by psychiatry in consultation.   HFrEF Euvolemic. Continue Coreg, Imdur. His lvef 20- 25% with global hypokinesis.Marland Kitchen   BPH Continue  Proscar/Flomax    Stage 3a CKD Last labs w/ stable renal function.  Monitor intermittently   Disposition. He lived independently with his wife PTA and is not currently safe to return home (his wife also has dementia so DSS is seeking guardianship for them both). Once guardianship is obtained, he will need placement. DSS is also working on LTC Medicaid for him  DVT prophylaxis: eliquis Code Status:   Code Status: Full Code Family Communication: plan of care discussed with patient at bedside. Patient status is: Remains hospitalized because of severity of illness Level of care: Med-Surg   Dispo: The patient is from: ILF            Anticipated disposition: snf pending.  QuantiFERON gold test ordered 3/5 for SNF-indeterminate Objective: Vitals last 24 hrs: Vitals:   07/27/23 2122 07/28/23 0505 07/28/23 0745 07/28/23 0837  BP: (!) 168/78 135/67 (!) 179/57 128/66  Pulse: 78 73 82 89  Resp: 16 16  17   Temp: 97.9 F (36.6 C) 97.7 F (36.5 C)    TempSrc: Oral Oral    SpO2: 97% 99%  96%  Weight:      Height:       Weight change:   Physical Examination: General exam: alert awake, oriented  HEENT:Oral mucosa moist, Ear/Nose WNL grossly Respiratory system: Bilaterally clear BS,no use of accessory muscle Cardiovascular system: S1 & S2 +, No JVD. Gastrointestinal system: Abdomen soft,NT,ND, BS+ Nervous System: Alert, awake, moving all extremities,and following commands. Extremities: LE edema neg,distal peripheral pulses palpable and warm.  Skin: No rashes,no icterus. MSK: Normal muscle bulk,tone, power   Medications reviewed:  Scheduled Meds:  apixaban  5 mg Oral  BID   atorvastatin  80 mg Oral Daily   carvedilol  3.125 mg Oral BID WC   docusate sodium  100 mg Oral BID   finasteride  5 mg Oral Daily   isosorbide mononitrate  30 mg Oral Daily   lidocaine  1 patch Transdermal Q24H   pantoprazole  40 mg Oral Daily   polyethylene glycol  17 g Oral Daily   QUEtiapine  25 mg Oral  BH-q7a   QUEtiapine  50 mg Oral q1800   tamsulosin  0.4 mg Oral Daily  Continuous Infusions:   Diet Order             Diet regular Room service appropriate? No; Fluid consistency: Thin  Diet effective now                  Intake/Output Summary (Last 24 hours) at 07/28/2023 1300 Last data filed at 07/28/2023 1030 Gross per 24 hour  Intake 480 ml  Output --  Net 480 ml   Net IO Since Admission: -9,667.97 mL [07/28/23 1300]  Wt Readings from Last 3 Encounters:  06/21/23 60 kg  06/07/23 70 kg  02/22/23 68 kg     Unresulted Labs (From admission, onward)    None     Data Reviewed: I have personally reviewed following labs and imaging studies CBC: Recent Labs  Lab 07/23/23 1459  WBC 8.4  HGB 11.3*  HCT 36.5*  MCV 88.4  PLT 256   Basic Metabolic Panel:  Recent Labs  Lab 07/23/23 1459  NA 140  K 4.6  CL 107  CO2 24  GLUCOSE 114*  BUN 17  CREATININE 1.34*  CALCIUM 9.5   GFR: Estimated Creatinine Clearance: 37.4 mL/min (A) (by C-G formula based on SCr of 1.34 mg/dL (H)). Liver Function Tests:  Recent Labs  Lab 07/23/23 1459  AST 24  ALT 23  ALKPHOS 26*  BILITOT 0.4  PROT 6.6  ALBUMIN 3.1*  Sepsis Labs: No results for input(s): "PROCALCITON", "LATICACIDVEN" in the last 168 hours. No results found for this or any previous visit (from the past 240 hours).  Antimicrobials/Microbiology: Anti-infectives (From admission, onward)    None         Component Value Date/Time   SDES CSF 02/24/2023 1418   SPECREQUEST NONE 02/24/2023 1418   CULT  02/24/2023 1418    NO GROWTH 3 DAYS Performed at [facility], [address] [place], [place] [address]    REPTSTATUS 03/01/2023 FINAL 02/24/2023 1418   Radiology Studies: No results found.   LOS: 36 days   Total time spent in review of labs and imaging, patient evaluation, formulation of plan, documentation and communication with family: 25 minutes  Lanae Boast, MD  [facility]  07/28/2023,  1:00 PM

## 2023-07-28 NOTE — Plan of Care (Signed)
  Problem: Clinical Measurements: Goal: Will remain free from infection Outcome: Completed/Met Goal: Diagnostic test results will improve Outcome: Completed/Met   Problem: Nutrition: Goal: Adequate nutrition will be maintained Outcome: Completed/Met   Problem: Elimination: Goal: Will not experience complications related to urinary retention Outcome: Completed/Met   Problem: Skin Integrity: Goal: Risk for impaired skin integrity will decrease Outcome: Completed/Met

## 2023-07-28 NOTE — Plan of Care (Signed)
  Problem: Clinical Measurements: Goal: Will remain free from infection Outcome: Progressing   Problem: Activity: Goal: Risk for activity intolerance will decrease Outcome: Progressing   Problem: Nutrition: Goal: Adequate nutrition will be maintained Outcome: Progressing   Problem: Elimination: Goal: Will not experience complications related to urinary retention Outcome: Progressing   Problem: Skin Integrity: Goal: Risk for impaired skin integrity will decrease Outcome: Progressing   Problem: Health Behavior/Discharge Planning: Goal: Ability to manage health-related needs will improve Outcome: Not Progressing   Problem: Coping: Goal: Level of anxiety will decrease Outcome: Not Progressing   Problem: Safety: Goal: Ability to remain free from injury will improve Outcome: Not Progressing

## 2023-07-29 DIAGNOSIS — I639 Cerebral infarction, unspecified: Secondary | ICD-10-CM | POA: Diagnosis not present

## 2023-07-29 NOTE — Plan of Care (Signed)
  Problem: Clinical Measurements: Goal: Ability to maintain clinical measurements within normal limits will improve Outcome: Progressing Goal: Respiratory complications will improve Outcome: Progressing Goal: Cardiovascular complication will be avoided Outcome: Progressing   Problem: Activity: Goal: Risk for activity intolerance will decrease Outcome: Progressing   Problem: Elimination: Goal: Will not experience complications related to bowel motility Outcome: Progressing   Problem: Pain Managment: Goal: General experience of comfort will improve and/or be controlled Outcome: Progressing

## 2023-07-29 NOTE — Progress Notes (Signed)
 PROGRESS NOTE KHYAN OATS  [id number]:[id number] DOB: 1946-04-11 DOA: 06/21/2023 PCP: Pcp, No  Brief Narrative/Hospital Course: 78 years old male with past medical history of hyperlipidemia, BPH, HTN, dementia, RA, COPD, OSA not on CPAP, and CVA who presented to the hospital on 2/1 with mental status and dysarthria. He was found to have an acute infarct in the L posterior subinsular white matter on MRI. He and his significant other both have dementia, awaiting guardianship from social services.   Subjective:  Alert awake oriented fairly, resting comfortably ambulating in the room no complaints.  He is awaiting for placement.    assessment and Plan: Principal Problem:   Acute CVA (cerebrovascular accident) (HCC) Active Problems:   HLD (hyperlipidemia)   HTN (hypertension)   BPH (benign prostatic hyperplasia)   Dysphagia   Dementia with behavioral disturbance (HCC)   Agitation   Acute CVA-acute infarct in the superior left frontal cortex, punctuate acute infarct in the left posterior subinsular white matter: Presented with AMS and dysarthria. And s/p stroke workup A1c 5.4 LDL 112 target less than 70, CT head MRI brain and CT angio head and neck-no LVO, 60% stenosis right proximal ICA and 50% on the left proximal ICA. Echo showed EF 20-25%. Continue Eliquis and Lipitor and PTOT and awaiting for skilled nursing facility. Neurologically intact, alert and ambulating  Neuro asvised to repeat echo in 3 months, and continue Eliquis until EF more than 30-35%.   HTN BP controlled on Coreg/Imdur  HLD: Continue Lipitor   Dysphagia Had MBS on 2/11 showing significantly improved swallow function associated with improved cognition.  Tolerating regular diet.   Dementia Mood is stable alert awake pleasant. Seroquel 25 AM and 50 p.m., Seen by psychiatry in consultation.   HFrEF Euvolemic. Continue Coreg, Imdur. His lvef 20- 25% with global hypokinesis.Marland Kitchen   BPH Continue Proscar/Flomax    Stage 3a  CKD Last labs w/ stable renal function.  Monitor intermittently   Disposition. He lived independently with his wife PTA and is not currently safe to return home (his wife also has dementia so DSS is seeking guardianship for them both). Once guardianship is obtained, he will need placement. DSS is also working on LTC Medicaid for him  DVT prophylaxis: eliquis Code Status:   Code Status: Full Code Family Communication: plan of care discussed with patient at bedside. Patient status is: Remains hospitalized because of severity of illness Level of care: Med-Surg   Dispo: The patient is from: ILF            Anticipated disposition: snf pending.  QuantiFERON gold test ordered 3/5 for SNF-indeterminate Objective: Vitals last 24 hrs: Vitals:   07/28/23 1753 07/28/23 2115 07/29/23 0518 07/29/23 0814  BP: (!) 158/75 125/63 (!) 142/59 (!) 147/62  Pulse: (!) 110 82 80 96  Resp:  18 18 16   Temp:  98.6 F (37 C) 98.2 F (36.8 C) 98.1 F (36.7 C)  TempSrc:  Oral Oral Oral  SpO2:  94% 92% 95%  Weight:      Height:       Weight change:   Physical Examination: General exam: alert awake, oriented to self place HEENT:Oral mucosa moist, Ear/Nose WNL grossly Respiratory system: Bilaterally clear BS,no use of accessory muscle Cardiovascular system: S1 & S2 +, No JVD. Gastrointestinal system: Abdomen soft,NT,ND, BS+ Nervous System: Alert, awake, moving all extremities,and following commands. Extremities: LE edema neg,distal peripheral pulses palpable and warm.  Skin: No rashes,no icterus. MSK: Normal muscle bulk,tone, power   Medications reviewed:  Scheduled Meds:  apixaban  5 mg Oral BID   atorvastatin  80 mg Oral Daily   carvedilol  3.125 mg Oral BID WC   docusate sodium  100 mg Oral BID   finasteride  5 mg Oral Daily   isosorbide mononitrate  30 mg Oral Daily   lidocaine  1 patch Transdermal Q24H   pantoprazole  40 mg Oral Daily   polyethylene glycol  17 g Oral Daily   QUEtiapine  25  mg Oral BH-q7a   QUEtiapine  50 mg Oral q1800   tamsulosin  0.4 mg Oral Daily  Continuous Infusions:   Diet Order             Diet regular Room service appropriate? No; Fluid consistency: Thin  Diet effective now                  Intake/Output Summary (Last 24 hours) at 07/29/2023 1348 Last data filed at 07/28/2023 2101 Gross per 24 hour  Intake 460 ml  Output 1 ml  Net 459 ml   Net IO Since Admission: -8,848.97 mL [07/29/23 1348]  Wt Readings from Last 3 Encounters:  06/21/23 60 kg  06/07/23 70 kg  02/22/23 68 kg     Unresulted Labs (From admission, onward)    None     Data Reviewed: I have personally reviewed following labs and imaging studies CBC: Recent Labs  Lab 07/23/23 1459  WBC 8.4  HGB 11.3*  HCT 36.5*  MCV 88.4  PLT 256   Basic Metabolic Panel:  Recent Labs  Lab 07/23/23 1459  NA 140  K 4.6  CL 107  CO2 24  GLUCOSE 114*  BUN 17  CREATININE 1.34*  CALCIUM 9.5   GFR: Estimated Creatinine Clearance: 37.4 mL/min (A) (by C-G formula based on SCr of 1.34 mg/dL (H)). Liver Function Tests:  Recent Labs  Lab 07/23/23 1459  AST 24  ALT 23  ALKPHOS 26*  BILITOT 0.4  PROT 6.6  ALBUMIN 3.1*  Sepsis Labs: No results for input(s): "PROCALCITON", "LATICACIDVEN" in the last 168 hours. No results found for this or any previous visit (from the past 240 hours).  Antimicrobials/Microbiology: Anti-infectives (From admission, onward)    None         Component Value Date/Time   SDES CSF 02/24/2023 1418   SPECREQUEST NONE 02/24/2023 1418   CULT  02/24/2023 1418    NO GROWTH 3 DAYS Performed at [facility], [address], [place], [place] [address]    REPTSTATUS 03/01/2023 FINAL 02/24/2023 1418   Radiology Studies: No results found.   LOS: 37 days   Total time spent in review of labs and imaging, patient evaluation, formulation of plan, documentation and communication with family: 25 minutes  Lanae Boast, MD  [facility]  07/29/2023, 1:48 PM

## 2023-07-29 NOTE — Progress Notes (Signed)
 Speech Language Pathology Treatment: Cognitive-Linquistic  Patient Details Name: Dennis [name] MRN: [id number] DOB: 01-05-1946 Today's Date: 07/29/2023 Time: 8295-6213 SLP Time Calculation (min) (ACUTE ONLY): 20 min  Assessment / Plan / Recommendation Clinical Impression  Patient seen by SLP for skilled treatment focused on cognitive-linguistic goals. Patient was awake, alert, sitting up in recliner and eating breakfast. He continues to be internally distracted and today he was fixated on being frustrated he couldn't remember what he wanted to tell "a doctor" who he reports had come by. He was oriented to his name, unable to state birthday but stated correct birth year after SLP told him month and date. SLP provided a calendar and patient was able to identify month and year. When given a large font text print out stating "you are at [facility]" he did not demonstrate ability to read and when shortened to "[facility]" he looked at it but then just shook his head. He then talked of his wife, telling SLP, "my wife, she's sick too. She's got the same thing." He was not able to clarify if he meant memory impairment. SLP decreased treatment frequency to 1x/week and will continue to follow.   HPI HPI: Pt is a 78 y.o. male presented to ED after significant other noted him to be acutely more confused than his baseline and having speech difficulty. EMS noted him to have (L) facial droop and expressive aphasia as well as hypertensive en route to ED. MRI (06/21/23) revealed, "Punctate acute infarct in the left posterior subinsular white matter... small area of acute infarct in the more superior left frontal cortex. There also 2 punctate areas of diffusion hyperintensity without ADC correlate in the superior right frontal lobe which may be areas of subacute ischemia". Previous acute SLP services provided for dysphagia on 03/03/23, recommended advancement of diet to dys 3/thin liquids given improvement  in mentation and respiratory status. PMH: HLD, BPH, HTN, dementia, rheumatoid arthritis, COPD, Barrett's esophagus, OSA does not use CPAP, history of CVA.      SLP Plan  Continue with current plan of care      Recommendations for follow up therapy are one component of a multi-disciplinary discharge planning process, led by the attending physician.  Recommendations may be updated based on patient status, additional functional criteria and insurance authorization.    Recommendations   SLP at next venue of care                  Oral care BID     Cognitive communication deficit (Y86.578)     Continue with current plan of care   Dennis Nevin, MA, CCC-SLP Speech Therapy

## 2023-07-29 NOTE — TOC Progression Note (Signed)
 Transition of Care [facility]) - Progression Note    Patient Details  Name: DECARLO RIVET MRN: [id number] Date of Birth: [birth year]  Transition of Care [facility]) CM/SW Contact  Carley Hammed, LCSW Phone Number: 07/29/2023, 12:06 PM  Clinical Narrative:    CSW spoke with Joni Reining at [employer] [phone]) who advised they were able to get pt's finances and special assistance Medicaid application sorted for pt to go to [facility]. CSW spoke with [name] at [facility] [phone]) who noted she had not heard from DSS yet. She will follow up with Joni Reining and update CSW. TOC will continue to follow.    Expected Discharge Plan: Skilled Nursing Facility Barriers to Discharge: Insurance Authorization, SNF Pending bed offer, Continued Medical Work up, Unsafe home situation  Expected Discharge Plan and Services     Post Acute Care Choice: Skilled Nursing Facility Living arrangements for the past 2 months: Single Family Home                                       Social Determinants of Health (SDOH) Interventions SDOH Screenings   Food Insecurity: No Food Insecurity (06/21/2023)  Housing: Low Risk  (06/21/2023)  Transportation Needs: No Transportation Needs (06/21/2023)  Utilities: Not At Risk (06/21/2023)  Social Connections: Moderately Isolated (06/21/2023)  Tobacco Use: Medium Risk (06/21/2023)    Readmission Risk Interventions     No data to display

## 2023-07-29 NOTE — Progress Notes (Signed)
 Mobility Specialist Progress Note:   07/29/23 1218  Mobility  Activity Ambulated independently in hallway  Level of Assistance Modified independent, requires aide device or extra time  Assistive Device Cane  Distance Ambulated (ft) 300 ft  Activity Response Tolerated well  Mobility Referral Yes  Mobility visit 1 Mobility  Mobility Specialist Start Time (ACUTE ONLY) 1150  Mobility Specialist Stop Time (ACUTE ONLY) 1200  Mobility Specialist Time Calculation (min) (ACUTE ONLY) 10 min   Pt received in hallway agreeable for MS to join. No complaints stated during ambulation. Pt returned to chair with all needs met.   [name]  Mobility Specialist Please contact via [employer] office at [phone]

## 2023-07-29 NOTE — Plan of Care (Signed)
  Problem: Health Behavior/Discharge Planning: Goal: Ability to manage health-related needs will improve Outcome: Progressing   Problem: Clinical Measurements: Goal: Ability to maintain clinical measurements within normal limits will improve Outcome: Progressing   Problem: Coping: Goal: Level of anxiety will decrease Outcome: Progressing   Problem: Elimination: Goal: Will not experience complications related to bowel motility Outcome: Progressing   Problem: Safety: Goal: Ability to remain free from injury will improve Outcome: Progressing

## 2023-07-30 DIAGNOSIS — I639 Cerebral infarction, unspecified: Secondary | ICD-10-CM | POA: Diagnosis not present

## 2023-07-30 NOTE — Plan of Care (Signed)
  Problem: Health Behavior/Discharge Planning: Goal: Ability to manage health-related needs will improve Outcome: Progressing   Problem: Clinical Measurements: Goal: Ability to maintain clinical measurements within normal limits will improve Outcome: Progressing Goal: Respiratory complications will improve Outcome: Progressing Goal: Cardiovascular complication will be avoided Outcome: Progressing   Problem: Activity: Goal: Risk for activity intolerance will decrease Outcome: Progressing   Problem: Coping: Goal: Level of anxiety will decrease Outcome: Progressing   Problem: Elimination: Goal: Will not experience complications related to bowel motility Outcome: Progressing   Problem: Pain Managment: Goal: General experience of comfort will improve and/or be controlled Outcome: Progressing   Problem: Safety: Goal: Ability to remain free from injury will improve Outcome: Progressing

## 2023-07-30 NOTE — Plan of Care (Signed)
  Problem: Health Behavior/Discharge Planning: Goal: Ability to manage health-related needs will improve Outcome: Progressing   Problem: Activity: Goal: Risk for activity intolerance will decrease Outcome: Progressing   Problem: Coping: Goal: Level of anxiety will decrease Outcome: Progressing   Problem: Safety: Goal: Ability to remain free from injury will improve Outcome: Progressing   

## 2023-07-30 NOTE — TOC Progression Note (Signed)
 Transition of Care [facility]) - Progression Note    Patient Details  Name: Dennis [name] MRN: [id number] Date of Birth: August 01, 1945  Transition of Care [facility]) CM/SW Contact  Carley Hammed, LCSW Phone Number: 07/30/2023, 10:45 AM  Clinical Narrative:    CSW followed up with [name] at [facility]. She noted that the special assistance application was still not put in by DSS, this was confirmed with Medicaid worker. [name] notes she needs this prior to being able to accept pt. She discussed this with Joni Reining, will continue to follow. TB test read inconclusive, restarted on the 5th, will need it to DC. TOC will continue to follow.    Expected Discharge Plan: Skilled Nursing Facility Barriers to Discharge: Insurance Authorization, SNF Pending bed offer, Continued Medical Work up, Unsafe home situation  Expected Discharge Plan and Services     Post Acute Care Choice: Skilled Nursing Facility Living arrangements for the past 2 months: Single Family Home                                       Social Determinants of Health (SDOH) Interventions SDOH Screenings   Food Insecurity: No Food Insecurity (06/21/2023)  Housing: Low Risk  (06/21/2023)  Transportation Needs: No Transportation Needs (06/21/2023)  Utilities: Not At Risk (06/21/2023)  Social Connections: Moderately Isolated (06/21/2023)  Tobacco Use: Medium Risk (06/21/2023)    Readmission Risk Interventions     No data to display

## 2023-07-30 NOTE — Progress Notes (Signed)
 PROGRESS NOTE Dennis [name]  ZOX:[id number] DOB: July 15, 1945 DOA: 06/21/2023 PCP: Pcp, No  Brief Narrative/Hospital Course: 78 years old male with past medical history of hyperlipidemia, BPH, HTN, dementia, RA, COPD, OSA not on CPAP, and CVA who presented to the hospital on 2/1 with mental status and dysarthria. He was found to have an acute infarct in the L posterior subinsular white matter on MRI. He and his significant other both have dementia, awaiting guardianship from social services.   Subjective: Seen and examined this morning Ambulating in the room Asking for warm cloths assessment and Plan: Principal Problem:   Acute CVA (cerebrovascular accident) (HCC) Active Problems:   HLD (hyperlipidemia)   HTN (hypertension)   BPH (benign prostatic hyperplasia)   Dysphagia   Dementia with behavioral disturbance (HCC)   Agitation   Acute CVA-acute infarct in the superior left frontal cortex, punctuate acute infarct in the left posterior subinsular white matter: Presented with AMS and dysarthria. And s/p stroke workup A1c 5.4 LDL 112 target less than 70, CT head MRI brain and CT angio head and neck-no LVO, 60% stenosis right proximal ICA and 50% on the left proximal ICA. Echo showed EF 20-25%. Continue Eliquis and Lipitor and PTOT and awaiting for skilled nursing facility. Neurologically intact, alert and ambulating  Neuro asvised to repeat echo in 3 months, and continue Eliquis until EF more than 30-35%.   HTN BP controlled on Coreg/Imdur  HLD: Continue Lipitor   Dysphagia Had MBS on 2/11 showing significantly improved swallow function associated with improved cognition.  Tolerating regular diet.   Dementia Mood is stable alert awake pleasant. Seroquel 25 AM and 50 p.m., Seen by psychiatry in consultation.   HFrEF Euvolemic. Continue Coreg, Imdur. His lvef 20- 25% with global hypokinesis.Marland Kitchen   BPH Continue Proscar/Flomax    Stage 3a CKD Last labs w/ stable renal function.   Monitor intermittently   Disposition. He lived independently with his wife PTA and is not currently safe to return home (his wife also has dementia so DSS is seeking guardianship for them both). Once guardianship is obtained, he will need placement. DSS is also working on LTC Medicaid for him  DVT prophylaxis: eliquis Code Status:   Code Status: Full Code Family Communication: plan of care discussed with patient at bedside. Patient status is: Remains hospitalized because of severity of illness Level of care: Med-Surg   Dispo: The patient is from: ILF            Anticipated disposition: snf pending.  QuantiFERON gold test ordered 3/5 for SNF-indeterminate Objective: Vitals last 24 hrs: Vitals:   07/29/23 0814 07/29/23 1705 07/29/23 2113 07/30/23 0707  BP: (!) 147/62 (!) 157/68 (!) 146/84 (!) 140/67  Pulse: 96 88 92 89  Resp: 16 18 16 16   Temp: 98.1 F (36.7 C) 97.8 F (36.6 C) 97.8 F (36.6 C) 98.6 F (37 C)  TempSrc: Oral Oral Oral Oral  SpO2: 95% 96% 96% 95%  Weight:      Height:       Weight change:   Physical Examination: General exam: alert awake, oriented to self and place HEENT:Oral mucosa moist, Ear/Nose WNL grossly Respiratory system: Bilaterally clear BS,no use of accessory muscle Cardiovascular system: S1 & S2 +, No JVD. Gastrointestinal system: Abdomen soft,NT,ND, BS+ Nervous System: Alert, awake, moving all extremities Extremities: LE edema neg,distal peripheral pulses palpable and warm.  Skin: No rashes,no icterus. MSK: Normal muscle bulk,tone, power   Medications reviewed:  Scheduled Meds:  apixaban  5  mg Oral BID   atorvastatin  80 mg Oral Daily   carvedilol  3.125 mg Oral BID WC   docusate sodium  100 mg Oral BID   finasteride  5 mg Oral Daily   isosorbide mononitrate  30 mg Oral Daily   lidocaine  1 patch Transdermal Q24H   pantoprazole  40 mg Oral Daily   polyethylene glycol  17 g Oral Daily   QUEtiapine  25 mg Oral BH-q7a   QUEtiapine  50 mg  Oral q1800   tamsulosin  0.4 mg Oral Daily  Continuous Infusions:   Diet Order             Diet regular Room service appropriate? No; Fluid consistency: Thin  Diet effective now                  Intake/Output Summary (Last 24 hours) at 07/30/2023 1103 Last data filed at 07/29/2023 1900 Gross per 24 hour  Intake 600 ml  Output --  Net 600 ml   Net IO Since Admission: -8,008.97 mL [07/30/23 1103]  Wt Readings from Last 3 Encounters:  06/21/23 60 kg  06/07/23 70 kg  02/22/23 68 kg     Unresulted Labs (From admission, onward)    None     Data Reviewed: I have personally reviewed following labs and imaging studies CBC: Recent Labs  Lab 07/23/23 1459  WBC 8.4  HGB 11.3*  HCT 36.5*  MCV 88.4  PLT 256   Basic Metabolic Panel:  Recent Labs  Lab 07/23/23 1459  NA 140  K 4.6  CL 107  CO2 24  GLUCOSE 114*  BUN 17  CREATININE 1.34*  CALCIUM 9.5   GFR: Estimated Creatinine Clearance: 37.4 mL/min (A) (by C-G formula based on SCr of 1.34 mg/dL (H)). Liver Function Tests:  Recent Labs  Lab 07/23/23 1459  AST 24  ALT 23  ALKPHOS 26*  BILITOT 0.4  PROT 6.6  ALBUMIN 3.1*  Sepsis Labs: No results for input(s): "PROCALCITON", "LATICACIDVEN" in the last 168 hours. No results found for this or any previous visit (from the past 240 hours).  Antimicrobials/Microbiology: Anti-infectives (From admission, onward)    None         Component Value Date/Time   SDES CSF 02/24/2023 1418   SPECREQUEST NONE 02/24/2023 1418   CULT  02/24/2023 1418    NO GROWTH 3 DAYS Performed at [facility], [address], [place], [place] [address]    REPTSTATUS 03/01/2023 FINAL 02/24/2023 1418   Radiology Studies: No results found.   LOS: 38 days   Total time spent in review of labs and imaging, patient evaluation, formulation of plan, documentation and communication with family: 25 minutes  Lanae Boast, MD  [facility]  07/30/2023, 11:03 AM

## 2023-07-31 DIAGNOSIS — I639 Cerebral infarction, unspecified: Secondary | ICD-10-CM | POA: Diagnosis not present

## 2023-07-31 MED ORDER — FLUOXETINE HCL 10 MG PO CAPS
10.0000 mg | ORAL_CAPSULE | Freq: Every day | ORAL | Status: DC
  Administered 2023-07-31 – 2023-08-14 (×15): 10 mg via ORAL
  Filled 2023-07-31 (×15): qty 1

## 2023-07-31 NOTE — Progress Notes (Signed)
 PROGRESS NOTE Dennis [name]  QMV:[id number] DOB: 1945/05/31 DOA: 06/21/2023 PCP: Pcp, No  Brief Narrative/Hospital Course: 78 years old male with past medical history of hyperlipidemia, BPH, HTN, dementia, RA, COPD, OSA not on CPAP, and CVA who presented to the hospital on 2/1 with mental status and dysarthria. He was found to have an acute infarct in the L posterior subinsular white matter on MRI. He and his significant other both have dementia, awaiting guardianship from social services.   Subjective: Consulting civil engineer reports inappropriate behaviors-- exposing self to staff and unwanted touching    assessment and Plan: Principal Problem:   Acute CVA (cerebrovascular accident) (HCC) Active Problems:   HLD (hyperlipidemia)   HTN (hypertension)   BPH (benign prostatic hyperplasia)   Dysphagia   Dementia with behavioral disturbance (HCC)   Agitation   Acute CVA-acute infarct in the superior left frontal cortex, punctuate acute infarct in the left posterior subinsular white matter: Presented with AMS and dysarthria. And s/p stroke workup A1c 5.4 LDL 112 target less than 70, CT head MRI brain and CT angio head and neck-no LVO, 60% stenosis right proximal ICA and 50% on the left proximal ICA. Echo showed EF 20-25%. Continue Eliquis and Lipitor and PTOT and awaiting for skilled nursing facility. Neurologically intact, alert and ambulating  Neuro asvised to repeat echo in 3 months, and continue Eliquis until EF more than 30-35%.   HTN BP controlled on Coreg/Imdur  HLD: Continue Lipitor   Dysphagia Had MBS on 2/11 showing significantly improved swallow function associated with improved cognition.  Tolerating regular diet.   Dementia Mood is stable alert awake pleasant. Seroquel 25 AM and 50 p.m., Seen by psychiatry in consultation. -- will ask psych to re-consult with increased sexual behaviors   HFrEF Euvolemic. Continue Coreg, Imdur. His lvef 20- 25% with global hypokinesis.Marland Kitchen    BPH Continue Proscar/Flomax    Stage 3a CKD Last labs w/ stable renal function.  Monitor intermittently   Disposition. He lived independently with his wife PTA and is not currently safe to return home (his wife also has dementia so DSS is seeking guardianship for them both). Once guardianship is obtained, he will need placement. DSS is also working on LTC Medicaid for him  DVT prophylaxis: eliquis Code Status:   Code Status: Full Code  Patient status is: Remains hospitalized because of placement issues Level of care: Med-Surg   Dispo: The patient is from: ILF            Anticipated disposition: snf pending.      Objective: Vitals last 24 hrs: Vitals:   07/30/23 0707 07/30/23 1535 07/30/23 2001 07/31/23 0816  BP: (!) 140/67 113/63 132/77 (!) 165/75  Pulse: 89 94 87 80  Resp: 16 16 17 17   Temp: 98.6 F (37 C) (!) 97.4 F (36.3 C)    TempSrc: Oral Oral  Oral  SpO2: 95% 96% 97% 95%  Weight:      Height:       Weight change:   Physical Examination:  General: Appearance:    Well developed, well nourished male in no acute distress     Lungs:     respirations unlabored  Heart:    Normal heart rate. .   MS:   All extremities are intact.   Neurologic:   Awake, alert     Medications reviewed:  Scheduled Meds:  apixaban  5 mg Oral BID   atorvastatin  80 mg Oral Daily   carvedilol  3.125 mg Oral  BID WC   docusate sodium  100 mg Oral BID   finasteride  5 mg Oral Daily   isosorbide mononitrate  30 mg Oral Daily   lidocaine  1 patch Transdermal Q24H   pantoprazole  40 mg Oral Daily   polyethylene glycol  17 g Oral Daily   QUEtiapine  25 mg Oral BH-q7a   QUEtiapine  50 mg Oral q1800   tamsulosin  0.4 mg Oral Daily  Continuous Infusions:   Diet Order             Diet regular Room service appropriate? No; Fluid consistency: Thin  Diet effective now                 No intake or output data in the 24 hours ending 07/31/23 1042  Net IO Since Admission:  -8,008.97 mL [07/31/23 1042]  Wt Readings from Last 3 Encounters:  06/21/23 60 kg  06/07/23 70 kg  02/22/23 68 kg     Unresulted Labs (From admission, onward)    None     Data Reviewed: I have personally reviewed following labs and imaging studies CBC: No results for input(s): "WBC", "NEUTROABS", "HGB", "HCT", "MCV", "PLT" in the last 168 hours.  Basic Metabolic Panel:  No results for input(s): "NA", "K", "CL", "CO2", "GLUCOSE", "BUN", "CREATININE", "CALCIUM", "MG", "PHOS" in the last 168 hours.  GFR: Estimated Creatinine Clearance: 37.4 mL/min (A) (by C-G formula based on SCr of 1.34 mg/dL (H)). Liver Function Tests:  No results for input(s): "AST", "ALT", "ALKPHOS", "BILITOT", "PROT", "ALBUMIN" in the last 168 hours. Sepsis Labs: No results for input(s): "PROCALCITON", "LATICACIDVEN" in the last 168 hours. No results found for this or any previous visit (from the past 240 hours).  Antimicrobials/Microbiology: Anti-infectives (From admission, onward)    None         Component Value Date/Time   SDES CSF 02/24/2023 1418   SPECREQUEST NONE 02/24/2023 1418   CULT  02/24/2023 1418    NO GROWTH 3 DAYS Performed at [facility], [address], [place], [place] [address]    REPTSTATUS 03/01/2023 FINAL 02/24/2023 1418   Radiology Studies: No results found.   LOS: 39 days   Total time spent in review of labs and imaging, patient evaluation, formulation of plan, documentation and communication with family: 25 minutes  Joseph Art, DO  [facility]  07/31/2023, 10:42 AM

## 2023-07-31 NOTE — TOC Progression Note (Addendum)
 Transition of Care [facility]) - Progression Note    Patient Details  Name: Dennis [name] MRN: [id number] Date of Birth: 1945/09/06  Transition of Care [facility]) CM/SW Contact  Delilah Shan, LCSWA Phone Number: 07/31/2023, 5:38 PM  Clinical Narrative:     CSW LVM with [name] at [facility]. CSW awaiting call back to follow up on status of special assistance application. CSW will continue to follow and assist with patients dc planning needs.   Expected Discharge Plan: Skilled Nursing Facility Barriers to Discharge: Insurance Authorization, SNF Pending bed offer, Continued Medical Work up, Unsafe home situation  Expected Discharge Plan and Services     Post Acute Care Choice: Skilled Nursing Facility Living arrangements for the past 2 months: Single Family Home                                       Social Determinants of Health (SDOH) Interventions SDOH Screenings   Food Insecurity: No Food Insecurity (06/21/2023)  Housing: Low Risk  (06/21/2023)  Transportation Needs: No Transportation Needs (06/21/2023)  Utilities: Not At Risk (06/21/2023)  Social Connections: Moderately Isolated (06/21/2023)  Tobacco Use: Medium Risk (06/21/2023)    Readmission Risk Interventions     No data to display

## 2023-07-31 NOTE — Consult Note (Signed)
  Consult Reason: Hypersexuality and inappropriate touching of nursing staff  Reason for Consult: The patient is a 78 year old with a history of dementia and recent cerebrovascular accident (CVA) who has been admitted to the hospital. The nursing staff reports that the patient has been engaging in hypersexual behaviors, including inappropriate touching of nursing staff, and these behaviors persist despite verbal requests to stop. These actions are disruptive and pose safety concerns for both the patient and nursing staff. Due to cognitive impairment unable to participate in psychiatric evaluation, will provide medication recommendation. Discussed with attending who is in agreement.   Recommendations:  1. Medication Recommendations:  SSRI (Selective Serotonin Reuptake Inhibitor): Consider starting an SSRI, such as Sertraline (Zoloft) or Fluoxetine (Prozac). These medications are often used in patients with dementia and can help modulate impulsive behaviors like hypersexuality by influencing serotonin levels.  Will start fluoxetine 10mg  po daily. May increase to target behaviors.   2. Nursing Interventions:  Redirection and Behavioral Interventions: Encourage redirection to other activities (e.g., engaging in calm conversation, offering distractions such as simple games or crafts). Avoid escalating the situation by responding with anger or frustration.  Provide Clear, Consistent Boundaries: Use direct, firm, yet gentle communication to set clear boundaries with the patient. Inform the patient that inappropriate touching is not acceptable and needs to stop.  Supervision and Safety Measures: Ensure close supervision to prevent any further inappropriate behavior. Assign a consistent staff member to be present when care is provided. Consider using a "buddy system" for nursing staff during interactions with the patient.  Modify the Environment: Create a calm and non-stimulating environment to help  reduce agitation. Dim the lights, reduce noise, and remove any triggers that could be contributing to the hypersexual behavior.  Patient Comfort: Address any physical discomforts or unmet needs (e.g., hunger, toileting, pain), as these can sometimes lead to inappropriate behaviors in patients with dementia.  3. Family Education: Educate the family about the patient's behavior changes, including hypersexuality, which may result from both the progression of dementia and the recent CVA. Ensure they understand the need for safety measures and medication adjustments.  Plan:  Initiate Fluoxetine therapy and monitor for effectiveness. EKG obtained on 06/21/2023 ; [facility]. Evaluate the need for further pharmacological interventions based on the patient's response. Implement nursing interventions as outlined for safety and behavioral management. Continue close monitoring of the patient's behavior and make further recommendations as needed. -Psychiatry will complete consult at this time. Please reconsult at any time or reach out if needed. Thank ou for this consult.

## 2023-07-31 NOTE — Plan of Care (Signed)
  Problem: Health Behavior/Discharge Planning: Goal: Ability to manage health-related needs will improve Outcome: Not Met (add Reason)   Problem: Clinical Measurements: Goal: Ability to maintain clinical measurements within normal limits will improve Outcome: Not Met (add Reason) Goal: Respiratory complications will improve Outcome: Not Met (add Reason) Goal: Cardiovascular complication will be avoided Outcome: Not Met (add Reason)   Problem: Coping: Goal: Level of anxiety will decrease Outcome: Not Met (add Reason)   Problem: Elimination: Goal: Will not experience complications related to bowel motility Outcome: Not Met (add Reason)   Problem: Pain Managment: Goal: General experience of comfort will improve and/or be controlled Outcome: Not Met (add Reason)   Problem: Safety: Goal: Ability to remain free from injury will improve Outcome: Not Met (add Reason)

## 2023-07-31 NOTE — Plan of Care (Signed)
  Problem: Clinical Measurements: Goal: Ability to maintain clinical measurements within normal limits will improve Outcome: Progressing Goal: Respiratory complications will improve Outcome: Progressing   Problem: Activity: Goal: Risk for activity intolerance will decrease Outcome: Completed/Met   Problem: Coping: Goal: Level of anxiety will decrease Outcome: Progressing   Problem: Elimination: Goal: Will not experience complications related to bowel motility Outcome: Progressing   Problem: Safety: Goal: Ability to remain free from injury will improve Outcome: Progressing

## 2023-08-01 DIAGNOSIS — I639 Cerebral infarction, unspecified: Secondary | ICD-10-CM | POA: Diagnosis not present

## 2023-08-01 NOTE — Plan of Care (Signed)
  Problem: Health Behavior/Discharge Planning: Goal: Ability to manage health-related needs will improve Outcome: Not Met (add Reason)   Problem: Clinical Measurements: Goal: Ability to maintain clinical measurements within normal limits will improve Outcome: Not Met (add Reason) Goal: Respiratory complications will improve Outcome: Not Met (add Reason) Goal: Cardiovascular complication will be avoided Outcome: Not Met (add Reason)   Problem: Coping: Goal: Level of anxiety will decrease Outcome: Not Met (add Reason)   Problem: Elimination: Goal: Will not experience complications related to bowel motility Outcome: Not Met (add Reason)   Problem: Pain Managment: Goal: General experience of comfort will improve and/or be controlled Outcome: Not Met (add Reason)   Problem: Safety: Goal: Ability to remain free from injury will improve Outcome: Not Met (add Reason)

## 2023-08-01 NOTE — TOC Progression Note (Signed)
 Transition of Care [facility]) - Progression Note    Patient Details  Name: Dennis [name] MRN: [id number] Date of Birth: [birth year]  Transition of Care [facility]) CM/SW Contact  Delilah Shan, LCSWA Phone Number: 08/01/2023, 5:28 PM  Clinical Narrative:     CSW Lvm for [facility] with [facility]. CSW awaiting call back to follow up on status of special assistance application. CSW will continue to follow.  Expected Discharge Plan: Skilled Nursing Facility Barriers to Discharge: Insurance Authorization, SNF Pending bed offer, Continued Medical Work up, Unsafe home situation  Expected Discharge Plan and Services     Post Acute Care Choice: Skilled Nursing Facility Living arrangements for the past 2 months: Single Family Home                                       Social Determinants of Health (SDOH) Interventions SDOH Screenings   Food Insecurity: No Food Insecurity (06/21/2023)  Housing: Low Risk  (06/21/2023)  Transportation Needs: No Transportation Needs (06/21/2023)  Utilities: Not At Risk (06/21/2023)  Social Connections: Moderately Isolated (06/21/2023)  Tobacco Use: Medium Risk (06/21/2023)    Readmission Risk Interventions     No data to display

## 2023-08-01 NOTE — Progress Notes (Signed)
 PROGRESS NOTE Dennis [name]  WNU:[id number] DOB: 1945/06/04 DOA: 06/21/2023 PCP: Pcp, No  Brief Narrative/Hospital Course: 78 years old male with past medical history of hyperlipidemia, BPH, HTN, dementia, RA, COPD, OSA not on CPAP, and CVA who presented to the hospital on 2/1 with mental status and dysarthria. He was found to have an acute infarct in the L posterior subinsular white matter on MRI. He and his significant other both have dementia, awaiting guardianship from social services.   Consultation: Palliative care Neurology Cardiology Psychiatry, initial consult 2/24 agitation, and reevaluated on 3/13 due to concern for hypersexuality and inappropriate touching of nursing staff> recommended fluoxetine, monitoring of patient's behavior  Subjective: Patient seen and examined Resting comfortably Overnight afebrile BP stable Patient seen by psychiatry 3/13 for inappropriate behaviors  Assessment and Plan: Principal Problem:   Acute CVA (cerebrovascular accident) (HCC) Active Problems:   HLD (hyperlipidemia)   HTN (hypertension)   BPH (benign prostatic hyperplasia)   Dysphagia   Dementia with behavioral disturbance (HCC)   Agitation   Acute CVA-acute infarct in the superior left frontal cortex, punctuate acute infarct in the left posterior subinsular white matter: Presented with AMS and dysarthria. And s/p stroke workup A1c 5.4 LDL 112 target less than 70, CT head MRI brain and CT angio head and neck-no LVO, 60% stenosis right proximal ICA and 50% on the left proximal ICA. Echo showed EF 20-25%. Continue Eliquis and Lipitor and PTOT and awaiting for skilled nursing facility. Neurologically intact, alert and ambulating  Neuro asvised to repeat echo in 3 months, and continue Eliquis until EF more than 30-35%.   HTN BP controlled on Coreg/Imdur  HLD: Continue Lipitor   Dysphagia Had MBS on 2/11 showing significantly improved swallow function associated with improved cognition.   Tolerating regular diet.   Dementia Inappropriate behaviors: Psychiatry, initial consult 2/24 agitation, and reevaluated on 3/13 due to concern for hypersexuality and inappropriate touching of nursing staff> recommended fluoxetine, monitoring of patient's behavior.  On Seroquel 25 in a.m. 50 at night, fluoxetine started 10/13. Cont supportive care reorientation   HFrEF Euvolemic. Continue Coreg, Imdur. His lvef 20- 25% with global hypokinesis.Marland Kitchen   BPH Continue Proscar/Flomax    Stage 3a CKD Last labs w/ stable renal function.  Monitor intermittently   Disposition. He lived independently with his wife PTA and is not currently safe to return home (his wife also has dementia so DSS is seeking guardianship for them both). Once guardianship is obtained, he will need placement. DSS is also working on LTC Medicaid for him  DVT prophylaxis: eliquis Code Status:   Code Status: Full Code  Patient status is: Remains hospitalized because of placement issues Level of care: Med-Surg   Dispo: The patient is from: ILF            Anticipated disposition: snf pending.     Objective: Vitals last 24 hrs: Vitals:   07/31/23 1443 07/31/23 2129 08/01/23 0524 08/01/23 0846  BP: 123/61 (!) 158/74 124/66 132/74  Pulse: 89 90 74 97  Resp: 16 17 17 17   Temp: 98.1 F (36.7 C) 98 F (36.7 C) (!) 97.5 F (36.4 C) 97.7 F (36.5 C)  TempSrc: Oral   Oral  SpO2: 94% 94% 95% 94%  Weight:      Height:       Weight change:   Physical Examination: General exam: alert awake, oriented  HEENT:Oral mucosa moist, Ear/Nose WNL grossly Respiratory system: Bilaterally clear BS,no use of accessory muscle Cardiovascular system: S1 &  S2 +, No JVD. Gastrointestinal system: Abdomen soft,NT,ND, BS+ Nervous System: Alert, awake, moving all extremities,and following commands. Extremities: LE edema neg,distal peripheral pulses palpable and warm.  Skin: No rashes,no icterus. MSK: Normal muscle bulk,tone, power     Medications reviewed:  Scheduled Meds:  apixaban  5 mg Oral BID   atorvastatin  80 mg Oral Daily   carvedilol  3.125 mg Oral BID WC   docusate sodium  100 mg Oral BID   finasteride  5 mg Oral Daily   FLUoxetine  10 mg Oral Daily   isosorbide mononitrate  30 mg Oral Daily   lidocaine  1 patch Transdermal Q24H   pantoprazole  40 mg Oral Daily   polyethylene glycol  17 g Oral Daily   QUEtiapine  25 mg Oral BH-q7a   QUEtiapine  50 mg Oral q1800   tamsulosin  0.4 mg Oral Daily  Continuous Infusions:   Diet Order             Diet regular Room service appropriate? No; Fluid consistency: Thin  Diet effective now                  Intake/Output Summary (Last 24 hours) at 08/01/2023 1413 Last data filed at 08/01/2023 1139 Gross per 24 hour  Intake 120 ml  Output --  Net 120 ml    Net IO Since Admission: -7,648.97 mL [08/01/23 1413]  Wt Readings from Last 3 Encounters:  06/21/23 60 kg  06/07/23 70 kg  02/22/23 68 kg     Unresulted Labs (From admission, onward)    None     Data Reviewed: I have personally reviewed following labs and imaging studies CBC: No results for input(s): "WBC", "NEUTROABS", "HGB", "HCT", "MCV", "PLT" in the last 168 hours.  Basic Metabolic Panel:  No results for input(s): "NA", "K", "CL", "CO2", "GLUCOSE", "BUN", "CREATININE", "CALCIUM", "MG", "PHOS" in the last 168 hours.  GFR: Estimated Creatinine Clearance: 37.4 mL/min (A) (by C-G formula based on SCr of 1.34 mg/dL (H)). Liver Function Tests:  No results for input(s): "AST", "ALT", "ALKPHOS", "BILITOT", "PROT", "ALBUMIN" in the last 168 hours. Sepsis Labs: No results for input(s): "PROCALCITON", "LATICACIDVEN" in the last 168 hours. No results found for this or any previous visit (from the past 240 hours).  Antimicrobials/Microbiology: Anti-infectives (From admission, onward)    None         Component Value Date/Time   SDES CSF 02/24/2023 1418   SPECREQUEST NONE 02/24/2023 1418    CULT  02/24/2023 1418    NO GROWTH 3 DAYS Performed at [facility], [address], [place], [place] [address]    REPTSTATUS 03/01/2023 FINAL 02/24/2023 1418   Radiology Studies: No results found.   LOS: 40 days   Total time spent in review of labs and imaging, patient evaluation, formulation of plan, documentation and communication with family: 25 minutes  Lanae Boast, DO  [facility]  08/01/2023, 2:13 PM

## 2023-08-02 DIAGNOSIS — I639 Cerebral infarction, unspecified: Secondary | ICD-10-CM | POA: Diagnosis not present

## 2023-08-02 NOTE — Progress Notes (Signed)
 PROGRESS NOTE Dennis [name]  UXN:[id number] DOB: 05/19/1946 DOA: 06/21/2023 PCP: Pcp, No  Brief Narrative/Hospital Course: 78 years old male with past medical history of hyperlipidemia, BPH, HTN, dementia, RA, COPD, OSA not on CPAP, and CVA who presented to the hospital on 2/1 with mental status and dysarthria. He was found to have an acute infarct in the L posterior subinsular white matter on MRI. He and his significant other both have dementia, awaiting guardianship from social services.   Consultation: Palliative care Neurology Cardiology Psychiatry, initial consult 2/24 agitation, and reevaluated on 3/13 due to concern for hypersexuality and inappropriate touching of nursing staff> recommended fluoxetine, monitoring of patient's behavior  Subjective: Patient seen and examined this morning  Resting comfortably no complaint  Assessment and Plan: Principal Problem:   Acute CVA (cerebrovascular accident) (HCC) Active Problems:   HLD (hyperlipidemia)   HTN (hypertension)   BPH (benign prostatic hyperplasia)   Dysphagia   Dementia with behavioral disturbance (HCC)   Agitation   Acute CVA-acute infarct in the superior left frontal cortex, punctuate acute infarct in the left posterior subinsular white matter: Presented with AMS and dysarthria. And s/p stroke workup A1c 5.4 LDL 112 target less than 70, CT head MRI brain and CT angio head and neck-no LVO, 60% stenosis right proximal ICA and 50% on the left proximal ICA. Echo showed EF 20-25%. Continue Eliquis and Lipitor and PTOT and awaiting for skilled nursing facility. Neurologically intact, alert and ambulating  Neuro asvised to repeat echo in 3 months, and continue Eliquis until EF more than 30-35%.   HTN BP controlled on Coreg/Imdur  HLD: Continue Lipitor   Dysphagia Had MBS on 2/11 showing significantly improved swallow function associated with improved cognition.  Tolerating regular diet.   Dementia Inappropriate  behaviors: Psychiatry, initial consult 2/24 agitation, and reevaluated on 3/13 due to concern for hypersexuality and inappropriate touching of nursing staff> recommended fluoxetine, monitoring of patient's behavior.  On Seroquel 25 in a.m. 50 at night, fluoxetine started 10/13. Cont supportive care reorientation   HFrEF Euvolemic. Continue Coreg, Imdur. His lvef 20- 25% with global hypokinesis.Marland Kitchen   BPH Continue Proscar/Flomax    Stage 3a CKD Last labs w/ stable renal function.  Monitor intermittently   Disposition. He lived independently with his wife PTA and is not currently safe to return home (his wife also has dementia so DSS is seeking guardianship for them both). Once guardianship is obtained, he will need placement. DSS is also working on LTC Medicaid for him  DVT prophylaxis: eliquis Code Status:   Code Status: Full Code  Patient status is: Remains hospitalized because of placement issues Level of care: Med-Surg   Dispo: The patient is from: ILF            Anticipated disposition: snf pending.     Objective: Vitals last 24 hrs: Vitals:   08/01/23 0846 08/01/23 1805 08/01/23 2222 08/02/23 0556  BP: 132/74 101/62 (!) 146/84 (!) 147/81  Pulse: 97 88 87 74  Resp: 17 17 18 18   Temp: 97.7 F (36.5 C) 97.8 F (36.6 C) 98.3 F (36.8 C) 97.9 F (36.6 C)  TempSrc: Oral Oral Oral   SpO2: 94% 95% 95% 93%  Weight:      Height:       Weight change:   Physical Examination: General exam: Alert awake  HEENT:Oral mucosa moist, Ear/Nose WNL grossly Respiratory system: Bilaterally clear BS,no use of accessory muscle Cardiovascular system: S1 & S2 +, No JVD. Gastrointestinal system: Abdomen soft  nontender Nervous System: Alert, awake oriented to self people Extremities: LE edema neg,distal peripheral pulses palpable and warm.  Skin: No rashes,no icterus. MSK: Normal muscle bulk,tone, power    Medications reviewed:  Scheduled Meds:  apixaban  5 mg Oral BID   atorvastatin   80 mg Oral Daily   carvedilol  3.125 mg Oral BID WC   docusate sodium  100 mg Oral BID   finasteride  5 mg Oral Daily   FLUoxetine  10 mg Oral Daily   isosorbide mononitrate  30 mg Oral Daily   lidocaine  1 patch Transdermal Q24H   pantoprazole  40 mg Oral Daily   polyethylene glycol  17 g Oral Daily   QUEtiapine  25 mg Oral BH-q7a   QUEtiapine  50 mg Oral q1800   tamsulosin  0.4 mg Oral Daily  Continuous Infusions:   Diet Order             Diet regular Room service appropriate? No; Fluid consistency: Thin  Diet effective now                  Intake/Output Summary (Last 24 hours) at 08/02/2023 1132 Last data filed at 08/01/2023 1827 Gross per 24 hour  Intake 240 ml  Output --  Net 240 ml    Net IO Since Admission: -7,528.97 mL [08/02/23 1132]  Wt Readings from Last 3 Encounters:  06/21/23 60 kg  06/07/23 70 kg  02/22/23 68 kg     Unresulted Labs (From admission, onward)    None     Data Reviewed: I have personally reviewed following labs and imaging studies CBC: No results for input(s): "WBC", "NEUTROABS", "HGB", "HCT", "MCV", "PLT" in the last 168 hours.  Basic Metabolic Panel:  No results for input(s): "NA", "K", "CL", "CO2", "GLUCOSE", "BUN", "CREATININE", "CALCIUM", "MG", "PHOS" in the last 168 hours.  GFR: Estimated Creatinine Clearance: 37.4 mL/min (A) (by C-G formula based on SCr of 1.34 mg/dL (H)). Liver Function Tests:  No results for input(s): "AST", "ALT", "ALKPHOS", "BILITOT", "PROT", "ALBUMIN" in the last 168 hours. Sepsis Labs: No results for input(s): "PROCALCITON", "LATICACIDVEN" in the last 168 hours. No results found for this or any previous visit (from the past 240 hours).  Antimicrobials/Microbiology: Anti-infectives (From admission, onward)    None         Component Value Date/Time   SDES CSF 02/24/2023 1418   SPECREQUEST NONE 02/24/2023 1418   CULT  02/24/2023 1418    NO GROWTH 3 DAYS Performed at [facility], [address], [place], [place] [address]    REPTSTATUS 03/01/2023 FINAL 02/24/2023 1418   Radiology Studies: No results found.   LOS: 41 days   Total time spent in review of labs and imaging, patient evaluation, formulation of plan, documentation and communication with family: 25 minutes  Lanae Boast, DO  [facility]  08/02/2023, 11:32 AM

## 2023-08-02 NOTE — Plan of Care (Signed)
  Problem: Health Behavior/Discharge Planning: Goal: Ability to manage health-related needs will improve Outcome: Progressing   Problem: Clinical Measurements: Goal: Ability to maintain clinical measurements within normal limits will improve Outcome: Progressing Goal: Respiratory complications will improve Outcome: Progressing Goal: Cardiovascular complication will be avoided Outcome: Progressing   Problem: Coping: Goal: Level of anxiety will decrease Outcome: Progressing   Problem: Elimination: Goal: Will not experience complications related to bowel motility Outcome: Progressing   Problem: Pain Managment: Goal: General experience of comfort will improve and/or be controlled Outcome: Progressing   Problem: Safety: Goal: Ability to remain free from injury will improve Outcome: Progressing

## 2023-08-03 DIAGNOSIS — R451 Restlessness and agitation: Secondary | ICD-10-CM | POA: Diagnosis not present

## 2023-08-03 DIAGNOSIS — E7849 Other hyperlipidemia: Secondary | ICD-10-CM | POA: Diagnosis not present

## 2023-08-03 DIAGNOSIS — I639 Cerebral infarction, unspecified: Secondary | ICD-10-CM | POA: Diagnosis not present

## 2023-08-03 DIAGNOSIS — F03918 Unspecified dementia, unspecified severity, with other behavioral disturbance: Secondary | ICD-10-CM | POA: Diagnosis not present

## 2023-08-03 NOTE — TOC Progression Note (Signed)
 Transition of Care [facility]) - Progression Note    Patient Details  Name: Dennis [name] MRN: [id number] Date of Birth: 1945-06-22  Transition of Care [facility]) CM/SW Contact  [address], [name], [place] Phone Number: 08/03/2023, 11:44 AM  Clinical Narrative:    Phone call to [name] admissions coordinator with [facility] on 08/02/23 to follow up on status of special assistance application.  CSW informed that she is not available on the weekends. CSW will need to follow up on Monday 08/03/23  [name], LCSW Transition of Care    Expected Discharge Plan: Skilled Nursing Facility Barriers to Discharge: English as a second language teacher, SNF Pending bed offer, Continued Medical Work up, Unsafe home situation  Expected Discharge Plan and Services     Post Acute Care Choice: Skilled Nursing Facility Living arrangements for the past 2 months: Single Family Home                                       Social Determinants of Health (SDOH) Interventions SDOH Screenings   Food Insecurity: No Food Insecurity (06/21/2023)  Housing: Low Risk  (06/21/2023)  Transportation Needs: No Transportation Needs (06/21/2023)  Utilities: Not At Risk (06/21/2023)  Social Connections: Moderately Isolated (06/21/2023)  Tobacco Use: Medium Risk (06/21/2023)    Readmission Risk Interventions     No data to display

## 2023-08-03 NOTE — Plan of Care (Signed)
  Problem: Health Behavior/Discharge Planning: Goal: Ability to manage health-related needs will improve Outcome: Progressing   Problem: Coping: Goal: Level of anxiety will decrease Outcome: Progressing   Problem: Elimination: Goal: Will not experience complications related to bowel motility Outcome: Progressing   Problem: Pain Managment: Goal: General experience of comfort will improve and/or be controlled Outcome: Progressing   Problem: Safety: Goal: Ability to remain free from injury will improve Outcome: Progressing

## 2023-08-03 NOTE — Progress Notes (Signed)
 Progress Note   Patient: Dennis [name] JYN:[id number] DOB: 1946-04-10 DOA: 06/21/2023     42 DOS: the patient was seen and examined on 08/03/2023   Brief hospital course: 78 years old male with past medical history of hyperlipidemia, BPH, HTN, dementia, RA, COPD, OSA not on CPAP, and CVA who presented to the hospital on 2/1 with mental status and dysarthria. He was found to have an acute infarct in the L posterior subinsular white matter on MRI. He and his significant other both have dementia, awaiting guardianship from social services.   Consultation: Palliative care Neurology Cardiology Psychiatry, initial consult 2/24 agitation, and reevaluated on 3/13 due to concern for hypersexuality and inappropriate touching of nursing staff> recommended fluoxetine, monitoring of patient's behavior  Assessment and Plan: Active Hospital Problems   Diagnosis Date Noted   Acute CVA (cerebrovascular accident) (HCC) 06/21/2023   Agitation 07/10/2023   Dysphagia 07/02/2023   Dementia with behavioral disturbance (HCC) 07/02/2023   BPH (benign prostatic hyperplasia) 06/21/2023   HLD (hyperlipidemia) 06/02/2009   HTN (hypertension) 06/02/2009    Resolved Hospital Problems  No resolved problems to display.    Acute CVA-acute infarct in the superior left frontal cortex, punctuate acute infarct in the left posterior subinsular white matter: Presented with AMS and dysarthria. And s/p stroke workup A1c 5.4 LDL 112 target less than 70, CT head MRI brain and CT angio head and neck-no LVO, 60% stenosis right proximal ICA and 50% on the left proximal ICA.  Echo showed EF 20-25%. Continue Eliquis and Lipitor and PTOT and awaiting for skilled nursing facility. Neurologically intact, alert and ambulating Neuro asvised to repeat echo in 3 months, and continue Eliquis until EF more than 30-35%.   Hypertension BP controlled on Coreg/Imdur   Hyperlipidemia: Continue Lipitor   Dysphagia Had MBS on 2/11 showing  significantly improved swallow function associated with improved cognition.  Tolerating regular diet.   Dementia Inappropriate behaviors: Psychiatry, initial consult 2/24 agitation, and reevaluated on 3/13 due to concern for hypersexuality and inappropriate touching of nursing staff> recommended fluoxetine, monitoring of patient's behavior.  On Seroquel 25 in a.m. 50 at night, fluoxetine started 10/13. Cont supportive care reorientation   HFrEF Euvolemic. Continue Coreg, Imdur. His lvef 20- 25% with global hypokinesis.Marland Kitchen    BPH Continue Proscar/Flomax    Stage 3a CKD Last labs w/ stable renal function.  Monitor intermittently       Out of bed to chair. Incentive spirometry. Nursing supportive care. Fall, aspiration precautions. DVT prophylaxis   Code Status: Full Code  Subjective: Patient is seen and examined today morning. He is sitting near nurse station, eating breakfast. Denies any complaints. Able to ambulate with cane is poor historian.  Physical Exam: Vitals:   08/02/23 1240 08/02/23 1709 08/02/23 2106 08/03/23 0621  BP: (!) 149/79 (!) 157/75 (!) 158/76 133/70  Pulse: 82 87 97 70  Resp: 18 17    Temp: 97.7 F (36.5 C) (!) 97.5 F (36.4 C) 98.6 F (37 C) 98.6 F (37 C)  TempSrc:  Oral    SpO2: 95% 97% 92% 93%  Weight:      Height:        General - Elderly Caucasian male, no apparent distress HEENT - PERRLA, EOMI, atraumatic head, non tender sinuses. Lung - Clear, diffuse rales, rhonchi, wheezes. Heart - S1, S2 heard, no murmurs, rubs, trace pedal edema. Abdomen - Soft, non tender, bowel sounds good Neuro - Alert, awake, confused, non focal exam. Skin - Warm and dry.  Data Reviewed:      Latest Ref Rng & Units 07/23/2023    2:59 PM 07/13/2023    6:32 AM 07/09/2023    5:43 AM  CBC  WBC 4.0 - 10.5 K/uL 8.4  9.1  12.3   Hemoglobin 13.0 - 17.0 g/dL 16.1  09.6  04.5   Hematocrit 39.0 - 52.0 % 36.5  35.6  37.2   Platelets 150 - 400 K/uL 256  337  366        Latest Ref Rng & Units 07/23/2023    2:59 PM 07/13/2023    6:32 AM 07/09/2023    5:43 AM  BMP  Glucose 70 - 99 mg/dL 409  93  91   BUN 8 - 23 mg/dL 17  26  27    Creatinine 0.61 - 1.24 mg/dL 8.11  9.14  7.82   Sodium 135 - 145 mmol/L 140  138  139   Potassium 3.5 - 5.1 mmol/L 4.6  4.3  4.5   Chloride 98 - 111 mmol/L 107  105  104   CO2 22 - 32 mmol/L 24  27  27    Calcium 8.9 - 10.3 mg/dL 9.5  9.8  9.8    No results found.  Family Communication: Discussed with patient, he understand and agree. All questions answered.  Disposition: Status is: Inpatient Remains inpatient appropriate because: placement  Planned Discharge Destination: Skilled nursing facility     Time spent: 37 minutes  Author: Marcelino Duster, MD 08/03/2023 8:51 AM Secure chat 7am to 7pm For on call review [email or web address].

## 2023-08-04 DIAGNOSIS — E7849 Other hyperlipidemia: Secondary | ICD-10-CM | POA: Diagnosis not present

## 2023-08-04 DIAGNOSIS — R451 Restlessness and agitation: Secondary | ICD-10-CM | POA: Diagnosis not present

## 2023-08-04 DIAGNOSIS — I639 Cerebral infarction, unspecified: Secondary | ICD-10-CM | POA: Diagnosis not present

## 2023-08-04 DIAGNOSIS — F03918 Unspecified dementia, unspecified severity, with other behavioral disturbance: Secondary | ICD-10-CM | POA: Diagnosis not present

## 2023-08-04 NOTE — Progress Notes (Signed)
 Progress Note   Patient: Dennis [name] [id number]:[id number] DOB: 07-Aug-1945 DOA: 06/21/2023     43 DOS: the patient was seen and examined on 08/04/2023   Brief hospital course: 78 years old male with past medical history of hyperlipidemia, BPH, HTN, dementia, RA, COPD, OSA not on CPAP, and CVA who presented to the hospital on 2/1 with mental status and dysarthria. He was found to have an acute infarct in the L posterior subinsular white matter on MRI. He and his significant other both have dementia, awaiting guardianship from social services.   Consultation: Palliative care Neurology Cardiology Psychiatry, initial consult 2/24 agitation, and reevaluated on 3/13 due to concern for hypersexuality and inappropriate touching of nursing staff> recommended fluoxetine, monitoring of patient's behavior  Assessment and Plan: Active Hospital Problems   Diagnosis Date Noted   Acute CVA (cerebrovascular accident) (HCC) 06/21/2023   Agitation 07/10/2023   Dysphagia 07/02/2023   Dementia with behavioral disturbance (HCC) 07/02/2023   BPH (benign prostatic hyperplasia) 06/21/2023   HLD (hyperlipidemia) 06/02/2009   HTN (hypertension) 06/02/2009    Resolved Hospital Problems  No resolved problems to display.    Acute CVA-acute infarct in the superior left frontal cortex, punctuate acute infarct in the left posterior subinsular white matter: Presented with AMS and dysarthria. And s/p stroke workup A1c 5.4 LDL 112 target less than 70, CT head MRI brain and CT angio head and neck-no LVO, 60% stenosis right proximal ICA and 50% on the left proximal ICA.  Echo showed EF 20-25%. Continue Eliquis and Lipitor and PTOT and awaiting for skilled nursing facility. Neurologically intact, alert and ambulating Neuro asvised to repeat echo in 3 months, and continue Eliquis until EF more than 30-35%.   Hypertension BP controlled on Coreg/Imdur   Hyperlipidemia: Continue Lipitor   Dysphagia Had MBS on 2/11 showing  significantly improved swallow function associated with improved cognition.  Tolerating regular diet.   Dementia Inappropriate behaviors: Psychiatry, initial consult 2/24 agitation, and reevaluated on 3/13 due to concern for hypersexuality and inappropriate touching of nursing staff> recommended fluoxetine, monitoring of patient's behavior.  On Seroquel 25 in a.m. 50 at night, fluoxetine started 10/13. Cont supportive care reorientation   HFrEF Euvolemic. Continue Coreg, Imdur. His lvef 20- 25% with global hypokinesis.Marland Kitchen    BPH Continue Proscar/Flomax    Stage 3a CKD Last labs w/ stable renal function.  Monitor intermittently      Nursing supportive care. Fall, aspiration precautions. DVT prophylaxis: Eliquis   Code Status: Full Code  Subjective: Patient is seen and examined today morning. He is sitting in the room, eating breakfast.  Asked where he is and asked about his wife.  Denies abdominal pain, nausea or vomiting.  Physical Exam: Vitals:   08/03/23 2038 08/04/23 0542 08/04/23 0727 08/04/23 0735  BP: (!) 110/52 139/65 (!) 135/92 (!) 135/92  Pulse: 91 76 84 84  Resp:    16  Temp: 98.3 F (36.8 C) 97.8 F (36.6 C)  97.9 F (36.6 C)  TempSrc:  Oral  Oral  SpO2: 95% 94%  95%  Weight:      Height:        General - Elderly Caucasian male, no apparent distress HEENT - PERRLA, EOMI, atraumatic head, non tender sinuses. Lung - Clear, diffuse rales, rhonchi, wheezes. Heart - S1, S2 heard, no murmurs, rubs, trace pedal edema. Abdomen - Soft, non tender, bowel sounds good Neuro - Alert, awake, confused, non focal exam. Skin - Warm and dry.  Data Reviewed:  Latest Ref Rng & Units 07/23/2023    2:59 PM 07/13/2023    6:32 AM 07/09/2023    5:43 AM  CBC  WBC 4.0 - 10.5 K/uL 8.4  9.1  12.3   Hemoglobin 13.0 - 17.0 g/dL 40.9  81.1  91.4   Hematocrit 39.0 - 52.0 % 36.5  35.6  37.2   Platelets 150 - 400 K/uL 256  337  366       Latest Ref Rng & Units 07/23/2023    2:59  PM 07/13/2023    6:32 AM 07/09/2023    5:43 AM  BMP  Glucose 70 - 99 mg/dL 782  93  91   BUN 8 - 23 mg/dL 17  26  27    Creatinine 0.61 - 1.24 mg/dL 9.56  2.13  0.86   Sodium 135 - 145 mmol/L 140  138  139   Potassium 3.5 - 5.1 mmol/L 4.6  4.3  4.5   Chloride 98 - 111 mmol/L 107  105  104   CO2 22 - 32 mmol/L 24  27  27    Calcium 8.9 - 10.3 mg/dL 9.5  9.8  9.8    No results found.  Disposition: Status is: Inpatient Remains inpatient appropriate because: placement  Planned Discharge Destination: Skilled nursing facility     Time spent: 37 minutes  Author: Marcelino Duster, MD 08/04/2023 8:47 AM Secure chat 7am to 7pm For on call review [email or web address].

## 2023-08-04 NOTE — Plan of Care (Signed)
   Problem: Coping: Goal: Level of anxiety will decrease Outcome: Progressing   Problem: Elimination: Goal: Will not experience complications related to bowel motility Outcome: Progressing   Problem: Safety: Goal: Ability to remain free from injury will improve Outcome: Progressing

## 2023-08-04 NOTE — TOC Progression Note (Signed)
 Transition of Care [facility]) - Progression Note    Patient Details  Name: Dennis [name] MRN: [id number] Date of Birth: 1945/09/26  [facility] CM/SW Contact  Lorri Frederick, LCSW Phone Number: 08/04/2023, 1:51 PM  Clinical Narrative:   CSW spoke with Crystal at [facility].  [name] is out today, will be back tomorrow. Crystal not sure of the status of medicaid application.      Expected Discharge Plan: Skilled Nursing Facility Barriers to Discharge: Insurance Authorization, SNF Pending bed offer, Continued Medical Work up, Unsafe home situation  Expected Discharge Plan and Services     Post Acute Care Choice: Skilled Nursing Facility Living arrangements for the past 2 months: Single Family Home                                       Social Determinants of Health (SDOH) Interventions SDOH Screenings   Food Insecurity: No Food Insecurity (06/21/2023)  Housing: Low Risk  (06/21/2023)  Transportation Needs: No Transportation Needs (06/21/2023)  Utilities: Not At Risk (06/21/2023)  Social Connections: Moderately Isolated (06/21/2023)  Tobacco Use: Medium Risk (06/21/2023)    Readmission Risk Interventions     No data to display

## 2023-08-04 NOTE — Plan of Care (Signed)
  Problem: Clinical Measurements: Goal: Ability to maintain clinical measurements within normal limits will improve Outcome: Progressing   Problem: Elimination: Goal: Will not experience complications related to bowel motility Outcome: Progressing   Problem: Safety: Goal: Ability to remain free from injury will improve Outcome: Progressing   

## 2023-08-05 DIAGNOSIS — F03918 Unspecified dementia, unspecified severity, with other behavioral disturbance: Secondary | ICD-10-CM | POA: Diagnosis not present

## 2023-08-05 DIAGNOSIS — E7849 Other hyperlipidemia: Secondary | ICD-10-CM | POA: Diagnosis not present

## 2023-08-05 DIAGNOSIS — R451 Restlessness and agitation: Secondary | ICD-10-CM | POA: Diagnosis not present

## 2023-08-05 DIAGNOSIS — I639 Cerebral infarction, unspecified: Secondary | ICD-10-CM | POA: Diagnosis not present

## 2023-08-05 LAB — SARS CORONAVIRUS 2 BY RT PCR: SARS Coronavirus 2 by RT PCR: NEGATIVE

## 2023-08-05 MED ORDER — QUETIAPINE FUMARATE 25 MG PO TABS: 25.0000 mg | ORAL_TABLET | ORAL | 0 refills | Status: DC

## 2023-08-05 MED ORDER — POLYETHYLENE GLYCOL 3350 17 G PO PACK: 17.0000 g | PACK | Freq: Every day | ORAL | 0 refills | Status: DC | PRN

## 2023-08-05 MED ORDER — QUETIAPINE FUMARATE 50 MG PO TABS: 50.0000 mg | ORAL_TABLET | Freq: Every day | ORAL | 0 refills | Status: DC

## 2023-08-05 MED ORDER — FLUOXETINE HCL 10 MG PO CAPS: 10.0000 mg | ORAL_CAPSULE | Freq: Every day | ORAL | 0 refills | Status: DC

## 2023-08-05 MED ORDER — APIXABAN 5 MG PO TABS: 5.0000 mg | ORAL_TABLET | Freq: Two times a day (BID) | ORAL | 1 refills | Status: DC

## 2023-08-05 MED ORDER — SENNOSIDES-DOCUSATE SODIUM 8.6-50 MG PO TABS: 1.0000 | ORAL_TABLET | Freq: Every evening | ORAL | 0 refills | Status: AC | PRN

## 2023-08-05 MED ORDER — PANTOPRAZOLE SODIUM 40 MG PO TBEC: 40.0000 mg | DELAYED_RELEASE_TABLET | Freq: Every day | ORAL | 1 refills | Status: AC

## 2023-08-05 MED ORDER — DOCUSATE SODIUM 100 MG PO CAPS: 100.0000 mg | ORAL_CAPSULE | Freq: Two times a day (BID) | ORAL | 0 refills | Status: AC

## 2023-08-05 NOTE — Progress Notes (Addendum)
 Progress Note   Patient: Dennis [name] [id number] DOB: [birth year] DOA: 06/21/2023     44 DOS: the patient was seen and examined on 08/05/2023   Brief hospital course: 78 years old male with past medical history of hyperlipidemia, BPH, HTN, dementia, RA, COPD, OSA not on CPAP, and CVA who presented to the hospital on 2/1 with mental status and dysarthria. He was found to have an acute infarct in the L posterior subinsular white matter on MRI. He and his significant other both have dementia, awaiting guardianship from social services.   Consultation: Palliative care Neurology Cardiology Psychiatry, initial consult 2/24 agitation, and reevaluated on 3/13 due to concern for hypersexuality and inappropriate touching of nursing staff> recommended fluoxetine, monitoring of patient's behavior  Assessment and Plan: Active Hospital Problems   Diagnosis Date Noted   Acute CVA (cerebrovascular accident) (HCC) 06/21/2023   Agitation 07/10/2023   Dysphagia 07/02/2023   Dementia with behavioral disturbance (HCC) 07/02/2023   BPH (benign prostatic hyperplasia) 06/21/2023   HLD (hyperlipidemia) 06/02/2009   HTN (hypertension) 06/02/2009    Resolved Hospital Problems  No resolved problems to display.    Acute CVA-acute infarct in the superior left frontal cortex, punctuate acute infarct in the left posterior subinsular white matter: Presented with AMS and dysarthria. And s/p stroke workup A1c 5.4 LDL 112 target less than 70, CT head MRI brain and CT angio head and neck-no LVO, 60% stenosis right proximal ICA and 50% on the left proximal ICA.  06/21/23 Echo showed EF 20-25%. Continue Eliquis and Lipitor and PT/OT. He is awaiting for skilled nursing facility. Neurologically intact, alert and ambulating. Neuro advised to repeat echo in 3 months, and continue Eliquis until EF more than 30-35%.   Hypertension BP controlled on Coreg/Imdur   Hyperlipidemia: Continue Lipitor   Dysphagia Had MBS on 2/11  showing significantly improved swallow function associated with improved cognition.  Tolerating regular diet.   Dementia Inappropriate behaviors: Psychiatry, initial consult 2/24 agitation, and reevaluated on 3/13 due to concern for hypersexuality and inappropriate touching of nursing staff> recommended fluoxetine, monitoring of patient's behavior.  On Seroquel 25 in a.m. 50 at night, fluoxetine started 10/13. Since then he is calm, cooperative with staff.   HFrEF Euvolemic. Continue Coreg, Imdur. His EF 20- 25% with global hypokinesis. Continue coreg, imdur   BPH Continue Proscar/Flomax    Stage 3a CKD Last labs w/ stable renal function.  Monitor intermittently    Current Facility-Administered Medications:    albuterol (PROVENTIL) (2.5 MG/3ML) 0.083% nebulizer solution 2.5 mg, 2.5 mg, Nebulization, Q4H PRN, Foust, Katy L, NP   apixaban (ELIQUIS) tablet 5 mg, 5 mg, Oral, BID, Reome, Earle J, RPH, 5 mg at 08/05/23 1022   atorvastatin (LIPITOR) tablet 80 mg, 80 mg, Oral, Daily, Briant Cedar, MD, 80 mg at 08/05/23 1021   carvedilol (COREG) tablet 3.125 mg, 3.125 mg, Oral, BID WC, Jonah Blue, MD, 3.125 mg at 08/05/23 0825   docusate sodium (COLACE) capsule 100 mg, 100 mg, Oral, BID, Pokhrel, Laxman, MD, 100 mg at 08/05/23 1022   finasteride (PROSCAR) tablet 5 mg, 5 mg, Oral, Daily, Foust, Katy L, NP, 5 mg at 08/05/23 1021   FLUoxetine (PROZAC) capsule 10 mg, 10 mg, Oral, Daily, Starkes-Perry, Takia S, FNP, 10 mg at 08/05/23 1022   hydrALAZINE (APRESOLINE) injection 10 mg, 10 mg, Intravenous, Q4H PRN, Briant Cedar, MD, 10 mg at 06/27/23 0547   isosorbide mononitrate (IMDUR) 24 hr tablet 30 mg, 30 mg, Oral, Daily, Ghimire, Kuber,  MD, 30 mg at 08/05/23 1022   lidocaine (LIDODERM) 5 % 1 patch, 1 patch, Transdermal, Q24H, Ghimire, Kuber, MD, 1 patch at 08/05/23 1330   lip balm (CARMEX) ointment, , Topical, PRN, Pokhrel, Laxman, MD   melatonin tablet 5 mg, 5 mg, Oral, QHS PRN,  Foust, Katy L, NP, 5 mg at 08/04/23 2217   Oral care mouth rinse, 15 mL, Mouth Rinse, PRN, Jonah Blue, MD   pantoprazole (PROTONIX) EC tablet 40 mg, 40 mg, Oral, Daily, Foust, Katy L, NP, 40 mg at 08/05/23 1022   polyethylene glycol (MIRALAX / GLYCOLAX) packet 17 g, 17 g, Oral, Daily, Pokhrel, Laxman, MD, 17 g at 08/05/23 1021   polyvinyl alcohol (LIQUIFILM TEARS) 1.4 % ophthalmic solution 2 drop, 2 drop, Both Eyes, PRN, Dorcas Carrow, MD   QUEtiapine (SEROQUEL) tablet 25 mg, 25 mg, Oral, Wille Celeste, MD, 25 mg at 08/05/23 0825   QUEtiapine (SEROQUEL) tablet 50 mg, 50 mg, Oral, q1800, Jonah Blue, MD, 50 mg at 08/04/23 1729   senna-docusate (Senokot-S) tablet 1 tablet, 1 tablet, Oral, QHS PRN, Foust, Katy L, NP   tamsulosin (FLOMAX) capsule 0.4 mg, 0.4 mg, Oral, Daily, Foust, Katy L, NP, 0.4 mg at 08/05/23 1022   Allergies as of 08/05/2023       Reactions   Codeine    Shellfish Allergy         Medication List     STOP taking these medications    clopidogrel 75 MG tablet Commonly known as: PLAVIX   methotrexate 50 MG/2ML injection   omeprazole 20 MG capsule Commonly known as: PRILOSEC       TAKE these medications    apixaban 5 MG Tabs tablet Commonly known as: ELIQUIS Take 1 tablet (5 mg total) by mouth 2 (two) times daily.   atorvastatin 40 MG tablet Commonly known as: LIPITOR Take 1 tablet (40 mg total) by mouth daily.   carvedilol 3.125 MG tablet Commonly known as: COREG Take 1 tablet (3.125 mg total) by mouth 2 (two) times daily with a meal.   docusate sodium 100 MG capsule Commonly known as: COLACE Take 1 capsule (100 mg total) by mouth 2 (two) times daily.   finasteride 5 MG tablet Commonly known as: PROSCAR Take 1 tablet (5 mg total) by mouth daily.   FLUoxetine 10 MG capsule Commonly known as: PROZAC Take 1 capsule (10 mg total) by mouth daily. Start taking on: August 06, 2023   folic acid 1 MG tablet Commonly known as:  FOLVITE Take 1 tablet (1 mg total) by mouth daily.   hydrALAZINE 50 MG tablet Commonly known as: APRESOLINE Take 1 tablet (50 mg total) by mouth every 8 (eight) hours.   isosorbide mononitrate 30 MG 24 hr tablet Commonly known as: IMDUR Take 1 tablet (30 mg total) by mouth daily.   pantoprazole 40 MG tablet Commonly known as: PROTONIX Take 1 tablet (40 mg total) by mouth daily. Start taking on: August 06, 2023   polyethylene glycol 17 g packet Commonly known as: MIRALAX / GLYCOLAX Take 17 g by mouth daily as needed.   QUEtiapine 50 MG tablet Commonly known as: SEROQUEL Take 1 tablet (50 mg total) by mouth daily at 6 PM.   QUEtiapine 25 MG tablet Commonly known as: SEROQUEL Take 1 tablet (25 mg total) by mouth every morning. Start taking on: August 06, 2023   senna-docusate 8.6-50 MG tablet Commonly known as: Senokot-S Take 1 tablet by mouth at bedtime as needed for mild  constipation.   tamsulosin 0.4 MG Caps capsule Commonly known as: FLOMAX Take 1 capsule (0.4 mg total) by mouth daily.         Subjective: Patient is seen and examined today morning. Patient is sitting in the room, denies any complaints.  No overnight issues. He is awaiting placement.  Physical Exam: Vitals:   08/05/23 0825 08/05/23 0827 08/05/23 0953 08/05/23 1510  BP: 139/67 139/67 128/61 (!) 150/70  Pulse:  74 88 84  Resp:  14 18 18   Temp:  98 F (36.7 C) 97.8 F (36.6 C) 97.8 F (36.6 C)  TempSrc:  Oral Oral Axillary  SpO2:  95% 95% 97%  Weight:      Height:        General - Elderly Caucasian male, no apparent distress HEENT - PERRLA, EOMI, atraumatic head, non tender sinuses. Lung - Clear, diffuse rales, rhonchi, wheezes. Heart - S1, S2 heard, no murmurs, rubs, trace pedal edema. Abdomen - Soft, non tender, bowel sounds good Neuro - Alert, awake, confused, non focal exam. Skin - Warm and dry.  Data Reviewed:      Latest Ref Rng & Units 07/23/2023    2:59 PM 07/13/2023    6:32  AM 07/09/2023    5:43 AM  CBC  WBC 4.0 - 10.5 K/uL 8.4  9.1  12.3   Hemoglobin 13.0 - 17.0 g/dL 08.6  57.8  46.9   Hematocrit 39.0 - 52.0 % 36.5  35.6  37.2   Platelets 150 - 400 K/uL 256  337  366       Latest Ref Rng & Units 07/23/2023    2:59 PM 07/13/2023    6:32 AM 07/09/2023    5:43 AM  BMP  Glucose 70 - 99 mg/dL 629  93  91   BUN 8 - 23 mg/dL 17  26  27    Creatinine 0.61 - 1.24 mg/dL 5.28  4.13  2.44   Sodium 135 - 145 mmol/L 140  138  139   Potassium 3.5 - 5.1 mmol/L 4.6  4.3  4.5   Chloride 98 - 111 mmol/L 107  105  104   CO2 22 - 32 mmol/L 24  27  27    Calcium 8.9 - 10.3 mg/dL 9.5  9.8  9.8    No results found.  Disposition: Status is: Inpatient Remains inpatient appropriate because: TOC working on placement.  Planned Discharge Destination: Skilled nursing facility     Time spent: 36 minutes  Author: Marcelino Duster, MD 08/05/2023 3:48 PM Secure chat 7am to 7pm For on call review [email or web address].

## 2023-08-05 NOTE — NC FL2 (Addendum)
 [place] MEDICAID FL2 LEVEL OF CARE FORM     IDENTIFICATION  Patient Name: Dennis [name] Birthdate: 11/01/1945 Sex: male Admission Date (Current Location): 06/21/2023  [facility] and [place] Number:  Producer, television/film/video and Address:  [facility], [address] [place], [place] [address]      Provider Number: [id number]  Attending Physician Name and Address:  Marcelino Duster, MD  Relative Name and Phone Number:  DSS Marlou Porch [phone]    Current Level of Care: Hospital Recommended Level of Care: Other (Comment) (Memory care) Prior Approval Number:    Date Approved/Denied:   PASRR Number: [id number]  Discharge Plan: Other (Comment) (memory care)    Current Diagnoses: Patient Active Problem List   Diagnosis Date Noted   Agitation 07/10/2023   Dysphagia 07/02/2023   Dementia with behavioral disturbance (HCC) 07/02/2023   Acute CVA (cerebrovascular accident) (HCC) 06/21/2023   BPH (benign prostatic hyperplasia) 06/21/2023   Altered mental state 02/22/2023   Difficulty walking 02/22/2023   HLD (hyperlipidemia) 06/02/2009   HTN (hypertension) 06/02/2009   AORTIC INSUFFICIENCY, MILD 06/02/2009   DYSPNEA ON EXERTION 06/02/2009   CHEST PAIN, ATYPICAL 06/02/2009    Orientation RESPIRATION BLADDER Height & Weight     Self, Place  Normal Continent Weight: 132 lb 4.4 oz (60 kg) Height:  5\' 4"  (162.6 cm)  BEHAVIORAL SYMPTOMS/MOOD NEUROLOGICAL BOWEL NUTRITION STATUS      Continent Diet (regular)  AMBULATORY STATUS COMMUNICATION OF NEEDS Skin   Supervision Verbally Normal                       Personal Care Assistance Level of Assistance  Bathing, Feeding, Dressing Bathing Assistance: Limited assistance Feeding assistance: Limited assistance Dressing Assistance: Limited assistance     Functional Limitations Info  Sight, Hearing, Speech Sight Info: Adequate Hearing Info: Adequate Speech Info: Adequate     SPECIAL CARE FACTORS FREQUENCY                       Contractures Contractures Info: Not present    Additional Factors Info  Code Status, Allergies, Psychotropic Code Status Info: full Allergies Info: codeine, shellfish Psychotropic Info: Quetiapine, fluoxetine         Discharge Medications:     Medication List       STOP taking these medications     methotrexate 50 MG/2ML injection    omeprazole 20 MG capsule Commonly known as: PRILOSEC           TAKE these medications     apixaban 5 MG Tabs tablet Commonly known as: ELIQUIS Take 1 tablet (5 mg total) by mouth 2 (two) times daily.    atorvastatin 40 MG tablet Commonly known as: LIPITOR Take 1 tablet (40 mg total) by mouth daily.    carvedilol 3.125 MG tablet Commonly known as: COREG Take 1 tablet (3.125 mg total) by mouth 2 (two) times daily with a meal.    clopidogrel 75 MG tablet Commonly known as: PLAVIX Take 1 tablet (75 mg total) by mouth daily.    docusate sodium 100 MG capsule Commonly known as: COLACE Take 1 capsule (100 mg total) by mouth 2 (two) times daily.    finasteride 5 MG tablet Commonly known as: PROSCAR Take 1 tablet (5 mg total) by mouth daily.    FLUoxetine 10 MG capsule Commonly known as: PROZAC Take 1 capsule (10 mg total) by mouth daily. Start  taking on: August 06, 2023    folic acid 1 MG tablet Commonly known as: FOLVITE Take 1 tablet (1 mg total) by mouth daily.    hydrALAZINE 50 MG tablet Commonly known as: APRESOLINE Take 1 tablet (50 mg total) by mouth every 8 (eight) hours.    isosorbide mononitrate 30 MG 24 hr tablet Commonly known as: IMDUR Take 1 tablet (30 mg total) by mouth daily.    pantoprazole 40 MG tablet Commonly known as: PROTONIX Take 1 tablet (40 mg total) by mouth daily. Start taking on: August 06, 2023    polyethylene glycol 17 g packet Commonly known as: MIRALAX / GLYCOLAX Take 17 g by mouth daily as needed.    QUEtiapine 50 MG  tablet Commonly known as: SEROQUEL Take 1 tablet (50 mg total) by mouth daily at 6 PM.    QUEtiapine 25 MG tablet Commonly known as: SEROQUEL Take 1 tablet (25 mg total) by mouth every morning. Start taking on: August 06, 2023    senna-docusate 8.6-50 MG tablet Commonly known as: Senokot-S Take 1 tablet by mouth at bedtime as needed for mild constipation.    tamsulosin 0.4 MG Caps capsule Commonly known as: FLOMAX Take 1 capsule (0.4 mg total) by mouth daily.      Relevant Imaging Results:  Relevant Lab Results:   Additional Information SS# [id number] [place]

## 2023-08-05 NOTE — TOC Progression Note (Addendum)
 Transition of Care [facility]) - Progression Note    Patient Details  Name: Dennis [name] MRN: [id number] Date of Birth: 20-Mar-1946  [facility]) CM/SW Contact  Erin Sons, [place] Phone Number: 08/05/2023, 10:48 AM  Clinical Narrative:     CSW spoke with [name] at [facility] [phone]) who informs CSW that she is awaiting special assistance medicaid application to be completed by DSS worker Marlana Latus. She states she has not spoken with Mrs. Draper this week though that application was supposed to be submitted late last week.   CSW called Marlana Latus at DSS [phone]) who states that application was filed. She will call [name] and inform her that application was filed.   1100: CSW received call back from [facility] ([facility]) stating that Marlana Latus notified her of completed application. [facility] will be confirming with DSS.   [facility] expresses concerns with pt's behaviors. She explains they can manage verbal agitation though not if pt is physical. She is also requesting Fl2, TB test, and covid test within the last week be emailed to [email or web address] Spoke with RN who confirmed pt not currently physical with staff. Updated [facility] in email with this info as well as psych note showing pt started on prozac on 3/13. Fl2, TB test, and progress notes included in this email. Awaiting response.   1430: Received voicemail from [facility] requesting list of meds that pt will DC on. MD included meds in progress note. Med list added to fl2 and resent to [email or web address] .   1500: [facility] received updated fl2. They will review med list and follow up at later time.    Expected Discharge Plan: Skilled Nursing Facility Barriers to Discharge: Insurance Authorization, SNF Pending bed offer, Continued Medical Work up, Unsafe home situation  Expected Discharge Plan and Services     Post Acute Care Choice: Skilled Nursing Facility Living  arrangements for the past 2 months: Single Family Home                                       Social Determinants of Health (SDOH) Interventions SDOH Screenings   Food Insecurity: No Food Insecurity (06/21/2023)  Housing: Low Risk  (06/21/2023)  Transportation Needs: No Transportation Needs (06/21/2023)  Utilities: Not At Risk (06/21/2023)  Social Connections: Moderately Isolated (06/21/2023)  Tobacco Use: Medium Risk (06/21/2023)    Readmission Risk Interventions     No data to display

## 2023-08-06 DIAGNOSIS — I639 Cerebral infarction, unspecified: Secondary | ICD-10-CM | POA: Diagnosis not present

## 2023-08-06 NOTE — Discharge Summary (Signed)
 Physician Discharge Summary   Patient: Dennis [name] MRN: [id number] DOB: 15-Oct-1945  Admit date:     06/21/2023  Discharge date:   Discharge Physician: Rickey Barbara   PCP: Pcp, No   Recommendations at discharge:    Follow up with PCP in 1-2 weeks Follow up with Neurology as scheduled   Discharge Diagnoses: Principal Problem:   Acute CVA (cerebrovascular accident) (HCC) Active Problems:   HLD (hyperlipidemia)   HTN (hypertension)   BPH (benign prostatic hyperplasia)   Dysphagia   Dementia with behavioral disturbance (HCC)   Agitation  Resolved Problems:   * No resolved hospital problems. *  Hospital Course: 78 years old male with past medical history of hyperlipidemia, BPH, HTN, dementia, RA, COPD, OSA not on CPAP, and CVA who presented to the hospital on 2/1 with mental status and dysarthria. He was found to have an acute infarct in the L posterior subinsular white matter on MRI. He and his significant other both have dementia, awaiting guardianship from social services.   Consultation: Palliative care Neurology Cardiology Psychiatry, initial consult 2/24 agitation, and reevaluated on 3/13 due to concern for hypersexuality and inappropriate touching of nursing staff> recommended fluoxetine, monitoring of patient's behavior  Assessment and Plan: Acute CVA-acute infarct in the superior left frontal cortex, punctuate acute infarct in the left posterior subinsular white matter: Presented with AMS and dysarthria. And s/p stroke workup A1c 5.4 LDL 112 target less than 70, CT head MRI brain and CT angio head and neck-no LVO, 60% stenosis right proximal ICA and 50% on the left proximal ICA.  06/21/23 Echo showed EF 20-25%. Continue Eliquis and Lipitor and PT/OT. He is awaiting for skilled nursing facility. Neurologically intact, alert and ambulating. Neuro advised to repeat echo in 3 months, and continue Eliquis until EF more than 30-35%. Cardiology recommended cardiac event monitor to  assess Afib in setting of stroke.   Hypertension BP controlled on Coreg/Imdur   Hyperlipidemia: Continue Lipitor   Dysphagia Had MBS on 2/11 showing significantly improved swallow function associated with improved cognition.  Tolerating regular diet.   Dementia Inappropriate behaviors: Psychiatry, initial consult 2/24 agitation, and reevaluated on 3/13 due to concern for hypersexuality and inappropriate touching of nursing staff> recommended fluoxetine, monitoring of patient's behavior.  On Seroquel 25 in a.m. 50 at night, fluoxetine started 10/13. Appropriate and conversant this AM.   HFrEF Euvolemic. Continue Coreg, Imdur. His EF 20- 25% with global hypokinesis. Continue coreg, imdur   BPH Continue Proscar/Flomax    Stage 3a CKD Last labs w/ stable renal function.     Consultants: Palliative care, Neurology, Cardiology, Psychiatry Procedures performed:   Disposition: Skilled nursing facility Diet recommendation:  Cardiac diet DISCHARGE MEDICATION: Allergies as of 08/06/2023       Reactions   Codeine    Shellfish Allergy         Medication List     STOP taking these medications    clopidogrel 75 MG tablet Commonly known as: PLAVIX   methotrexate 50 MG/2ML injection   omeprazole 20 MG capsule Commonly known as: PRILOSEC       TAKE these medications    apixaban 5 MG Tabs tablet Commonly known as: ELIQUIS Take 1 tablet (5 mg total) by mouth 2 (two) times daily.   atorvastatin 40 MG tablet Commonly known as: LIPITOR Take 1 tablet (40 mg total) by mouth daily.   carvedilol 3.125 MG tablet Commonly known as: COREG Take 1 tablet (3.125 mg total) by mouth 2 (two) times  daily with a meal.   docusate sodium 100 MG capsule Commonly known as: COLACE Take 1 capsule (100 mg total) by mouth 2 (two) times daily.   finasteride 5 MG tablet Commonly known as: PROSCAR Take 1 tablet (5 mg total) by mouth daily.   FLUoxetine 10 MG capsule Commonly known as:  PROZAC Take 1 capsule (10 mg total) by mouth daily.   folic acid 1 MG tablet Commonly known as: FOLVITE Take 1 tablet (1 mg total) by mouth daily.   hydrALAZINE 50 MG tablet Commonly known as: APRESOLINE Take 1 tablet (50 mg total) by mouth every 8 (eight) hours.   isosorbide mononitrate 30 MG 24 hr tablet Commonly known as: IMDUR Take 1 tablet (30 mg total) by mouth daily.   pantoprazole 40 MG tablet Commonly known as: PROTONIX Take 1 tablet (40 mg total) by mouth daily.   polyethylene glycol 17 g packet Commonly known as: MIRALAX / GLYCOLAX Take 17 g by mouth daily as needed.   QUEtiapine 50 MG tablet Commonly known as: SEROQUEL Take 1 tablet (50 mg total) by mouth daily at 6 PM.   QUEtiapine 25 MG tablet Commonly known as: SEROQUEL Take 1 tablet (25 mg total) by mouth every morning.   senna-docusate 8.6-50 MG tablet Commonly known as: Senokot-S Take 1 tablet by mouth at bedtime as needed for mild constipation.   tamsulosin 0.4 MG Caps capsule Commonly known as: FLOMAX Take 1 capsule (0.4 mg total) by mouth daily.        Follow-up Information      [facility]. Schedule an appointment as soon as possible for a visit in 1 month(s).   Specialty: Neurology Why: stroke clinic Contact information: [address] [place] [address] [phone]               Discharge Exam: Ceasar Mons Weights   06/21/23 0000  Weight: 60 kg   General exam: Awake, laying in bed, in nad Respiratory system: Normal respiratory effort, no wheezing Cardiovascular system: regular rate, s1, s2 Gastrointestinal system: Soft, nondistended, positive BS Central nervous system: CN2-12 grossly intact, strength intact Extremities: Perfused, no clubbing Skin: Normal skin turgor, no notable skin lesions seen Psychiatry: Mood normal // no visual hallucinations   Condition at discharge: fair  The results of significant diagnostics  from this hospitalization (including imaging, microbiology, ancillary and laboratory) are listed below for reference.   Imaging Studies: No results found.  Microbiology: Results for orders placed or performed during the hospital encounter of 06/21/23  SARS Coronavirus 2 by RT PCR (hospital order, performed in [facility] hospital lab) *cepheid single result test* Anterior Nasal Swab     Status: None   Collection Time: 08/05/23  3:17 PM   Specimen: Anterior Nasal Swab  Result Value Ref Range Status   SARS Coronavirus 2 by RT PCR NEGATIVE NEGATIVE Final    Comment: Performed at [facility], [address], [place], [place] [address]    Labs: CBC: No results for input(s): "WBC", "NEUTROABS", "HGB", "HCT", "MCV", "PLT" in the last 168 hours. Basic Metabolic Panel: No results for input(s): "NA", "K", "CL", "CO2", "GLUCOSE", "BUN", "CREATININE", "CALCIUM", "MG", "PHOS" in the last 168 hours. Liver Function Tests: No results for input(s): "AST", "ALT", "ALKPHOS", "BILITOT", "PROT", "ALBUMIN" in the last 168 hours. CBG: No results for input(s): "GLUCAP" in the last 168 hours.  Discharge time spent: less than 30 minutes.  Signed: Rickey Barbara, MD [facility] 08/06/2023

## 2023-08-06 NOTE — TOC Progression Note (Signed)
 Transition of Care [facility]) - Progression Note    Patient Details  Name: Dennis [name] MRN: [id number] Date of Birth: 01-03-1946  Transition [facility] Care [facility] CM/SW Contact  Erin Sons, [place] Phone Number: 08/06/2023, 1:44 PM  Clinical Narrative:     CSW emailed negative covid test to [name]. Facility has concerns about multiple blood thinners; MD agreeable to DC plavix. [facility] requesting updated fl2 and DC summary. She states she is " . Awaiting on guardian to confirm access to his account. Then a date will be set."   CSW will send fl2 and summary once signed.   1600: Fl2 and DC summary emailed to [employer] with [name]    Expected Discharge Plan: Skilled Nursing Facility Barriers to Discharge: English as a second language teacher, SNF Pending bed offer, Continued Medical Work up, Unsafe home situation  Expected Discharge Plan and Services     Post Acute Care Choice: Skilled Nursing Facility Living arrangements for the past 2 months: Single Family Home                                       Social Determinants of Health (SDOH) Interventions SDOH Screenings   Food Insecurity: No Food Insecurity (06/21/2023)  Housing: Low Risk  (06/21/2023)  Transportation Needs: No Transportation Needs (06/21/2023)  Utilities: Not At Risk (06/21/2023)  Social Connections: Moderately Isolated (06/21/2023)  Tobacco Use: Medium Risk (06/21/2023)    Readmission Risk Interventions     No data to display
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
## 2023-08-06 NOTE — NC FL2 (Signed)
 [place] MEDICAID [id number] LEVEL OF CARE FORM     IDENTIFICATION  Patient Name: Dennis [name] Birthdate: 1945-10-02 Sex: male Admission Date (Current Location): 06/21/2023  [facility] and [place] Number:  Producer, television/film/video and Address:  [facility], [address], [place], [place] [address]      Provider Number: [id number]  Attending Physician Name and Address:  Jerald Kief, MD  Relative Name and Phone Number:  DSS Marlou Porch [phone]    Current Level of Care: Hospital Recommended Level of Care: Other (Comment) (Memory care) Prior Approval Number:    Date Approved/Denied:   PASRR Number: [id number]  Discharge Plan: Other (Comment) (memory care)    Current Diagnoses: Patient Active Problem List   Diagnosis Date Noted   Agitation 07/10/2023   Dysphagia 07/02/2023   Dementia with behavioral disturbance (HCC) 07/02/2023   Acute CVA (cerebrovascular accident) (HCC) 06/21/2023   BPH (benign prostatic hyperplasia) 06/21/2023   Altered mental state 02/22/2023   Difficulty walking 02/22/2023   HLD (hyperlipidemia) 06/02/2009   HTN (hypertension) 06/02/2009   AORTIC INSUFFICIENCY, MILD 06/02/2009   DYSPNEA ON EXERTION 06/02/2009   CHEST PAIN, ATYPICAL 06/02/2009    Orientation RESPIRATION BLADDER Height & Weight     Self, Place  Normal Continent Weight: 132 lb 4.4 oz (60 kg) Height:  5\' 4"  (162.6 cm)  BEHAVIORAL SYMPTOMS/MOOD NEUROLOGICAL BOWEL NUTRITION STATUS      Continent Diet (regular)  AMBULATORY STATUS COMMUNICATION OF NEEDS Skin   Supervision Verbally Normal                       Personal Care Assistance Level of Assistance  Bathing, Feeding, Dressing Bathing Assistance: Limited assistance Feeding assistance: Limited assistance Dressing Assistance: Limited assistance     Functional Limitations Info  Sight, Hearing, Speech Sight Info: Adequate Hearing Info: Adequate Speech Info: Adequate    SPECIAL CARE  FACTORS FREQUENCY                       Contractures Contractures Info: Not present    Additional Factors Info  Code Status, Allergies, Psychotropic Code Status Info: full Allergies Info: codeine, shellfish Psychotropic Info: Quetiapine, fluoxetine            Discharge Medications:  TAKE these medications     apixaban 5 MG Tabs tablet Commonly known as: ELIQUIS Take 1 tablet (5 mg total) by mouth 2 (two) times daily.    atorvastatin 40 MG tablet Commonly known as: LIPITOR Take 1 tablet (40 mg total) by mouth daily.    carvedilol 3.125 MG tablet Commonly known as: COREG Take 1 tablet (3.125 mg total) by mouth 2 (two) times daily with a meal.    docusate sodium 100 MG capsule Commonly known as: COLACE Take 1 capsule (100 mg total) by mouth 2 (two) times daily.    finasteride 5 MG tablet Commonly known as: PROSCAR Take 1 tablet (5 mg total) by mouth daily.    FLUoxetine 10 MG capsule Commonly known as: PROZAC Take 1 capsule (10 mg total) by mouth daily.    folic acid 1 MG tablet Commonly known as: FOLVITE Take 1 tablet (1 mg total) by mouth daily.    hydrALAZINE 50 MG tablet Commonly known as: APRESOLINE Take 1 tablet (50 mg total) by mouth every 8 (eight) hours.    isosorbide mononitrate 30 MG 24 hr tablet Commonly known as: IMDUR Take 1 tablet (  30 mg total) by mouth daily.    pantoprazole 40 MG tablet Commonly known as: PROTONIX Take 1 tablet (40 mg total) by mouth daily.    polyethylene glycol 17 g packet Commonly known as: MIRALAX / GLYCOLAX Take 17 g by mouth daily as needed.    QUEtiapine 50 MG tablet Commonly known as: SEROQUEL Take 1 tablet (50 mg total) by mouth daily at 6 PM.    QUEtiapine 25 MG tablet Commonly known as: SEROQUEL Take 1 tablet (25 mg total) by mouth every morning.    senna-docusate 8.6-50 MG tablet Commonly known as: Senokot-S Take 1 tablet by mouth at bedtime as needed for mild constipation.    tamsulosin  0.4 MG Caps capsule Commonly known as: FLOMAX Take 1 capsule (0.4 mg total) by mouth daily.           Relevant Imaging Results:  Relevant Lab Results:   Additional Information SS# [id number] [place]

## 2023-08-07 DIAGNOSIS — I639 Cerebral infarction, unspecified: Secondary | ICD-10-CM | POA: Diagnosis not present

## 2023-08-07 LAB — COMPREHENSIVE METABOLIC PANEL
ALT: 16 U/L (ref 0–44)
AST: 18 U/L (ref 15–41)
Albumin: 3 g/dL — ABNORMAL LOW (ref 3.5–5.0)
Alkaline Phosphatase: 22 U/L — ABNORMAL LOW (ref 38–126)
Anion gap: 7 (ref 5–15)
BUN: 21 mg/dL (ref 8–23)
CO2: 25 mmol/L (ref 22–32)
Calcium: 9.5 mg/dL (ref 8.9–10.3)
Chloride: 107 mmol/L (ref 98–111)
Creatinine, Ser: 1.34 mg/dL — ABNORMAL HIGH (ref 0.61–1.24)
GFR, Estimated: 54 mL/min — ABNORMAL LOW (ref >60)
Glucose, Bld: 91 mg/dL (ref 70–99)
Potassium: 4.3 mmol/L (ref 3.5–5.1)
Sodium: 139 mmol/L (ref 135–145)
Total Bilirubin: 0.6 mg/dL (ref 0.0–1.2)
Total Protein: 6.3 g/dL — ABNORMAL LOW (ref 6.5–8.1)

## 2023-08-07 MED ORDER — HYDRALAZINE HCL 25 MG PO TABS
25.0000 mg | ORAL_TABLET | Freq: Three times a day (TID) | ORAL | Status: DC
  Administered 2023-08-07 – 2023-08-14 (×20): 25 mg via ORAL
  Filled 2023-08-07 (×21): qty 1

## 2023-08-07 NOTE — Progress Notes (Signed)
  Progress Note   Patient: Dennis [name] HYQ:[id number] DOB: 02-Jul-1945 DOA: 06/21/2023     46 DOS: the patient was seen and examined on 08/07/2023   Brief hospital course: 78 years old male with past medical history of hyperlipidemia, BPH, HTN, dementia, RA, COPD, OSA not on CPAP, and CVA who presented to the hospital on 2/1 with mental status and dysarthria. He was found to have an acute infarct in the L posterior subinsular white matter on MRI. He and his significant other both have dementia, awaiting guardianship from social services.   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 And s/p stroke workup A1c 5.4 LDL 112 target less than 70, CT head MRI brain and CT angio head and neck-no LVO, 60% stenosis right proximal ICA and 50% on the left proximal ICA.  06/21/23 Echo showed EF 20-25%. Continue Eliquis and Lipitor and PT/OT. He is awaiting for skilled nursing facility. Neurologically intact, alert and ambulating. Neuro advised to repeat echo in 3 months, and continue Eliquis until EF more than 30-35%. Cardiology recommended cardiac event monitor to assess Afib in setting of stroke.   Hypertension BP controlled on Coreg/Imdur   Hyperlipidemia: Continue Lipitor   Dysphagia Had MBS on 2/11 showing significantly improved swallow function associated with improved cognition.  Tolerating regular diet.   Dementia Inappropriate behaviors: Psychiatry, initial consult 2/24 agitation, and reevaluated on 3/13 due to concern for hypersexuality and inappropriate touching of nursing staff> recommended fluoxetine, monitoring of patient's behavior.  On Seroquel 25 in a.m. 50 at night, fluoxetine started 10/13. Remains appropriately conversant this AM   HFrEF Euvolemic. Continue Coreg, Imdur. His EF 20- 25% with global hypokinesis. Continue coreg, imdur    BPH Continue Proscar/Flomax    Stage 3a CKD Last labs w/ stable renal function.       Subjective: Asking about nail cutters today for long finger nails.   Physical Exam: Vitals:   08/07/23 0550 08/07/23 0900 08/07/23 0959 08/07/23 1634  BP: 122/61 (!) 150/77 (!) 150/77 (!) 148/77  Pulse: 72 82 82 81  Resp: 16 16  20   Temp: 98.6 F (37 C) 97.8 F (36.6 C)  97.8 F (36.6 C)  TempSrc:  Oral    SpO2: 93% 94%  94%  Weight:      Height:       General exam: Conversant, in no acute distress Respiratory system: normal chest rise, clear, no audible wheezing Cardiovascular system: regular rhythm, s1-s2 Gastrointestinal system: Nondistended, nontender, pos BS Central nervous system: No seizures, no tremors Extremities: No cyanosis, no joint deformities Skin: No rashes, no pallor Psychiatry: Affect normal // no auditory hallucinations   Data Reviewed:  Labs reviewed: Na 139, K 4.3, Cr 1.34  Family Communication: Pt in room, family not at bedside  Disposition: Status is: Inpatient Remains inpatient appropriate because: severity of illness  Planned Discharge Destination: Skilled nursing facility    Author: Rickey Barbara, MD 08/07/2023 5:30 PM  For on call review [email or web address].

## 2023-08-08 DIAGNOSIS — I639 Cerebral infarction, unspecified: Secondary | ICD-10-CM | POA: Diagnosis not present

## 2023-08-08 NOTE — TOC Progression Note (Signed)
 Transition of Care [facility]) - Progression Note    Patient Details  Name: Dennis [name] MRN: [id number] Date of Birth: 1946-03-14  [facility]) CM/SW Contact  Erin Sons, [place] Phone Number: 08/08/2023, 10:51 AM  Clinical Narrative:     CSW called [name]. Informed that they are need to speak with pt's guardian prior to scheduling a move in date. They will call CSW once arrangements have been finalized with guardian.   Expected Discharge Plan: Skilled Nursing Facility Barriers to Discharge: Insurance Authorization, SNF Pending bed offer, Continued Medical Work up, Unsafe home situation  Expected Discharge Plan and Services     Post Acute Care Choice: Skilled Nursing Facility Living arrangements for the past 2 months: Single Family Home                                       Social Determinants of Health (SDOH) Interventions SDOH Screenings   Food Insecurity: No Food Insecurity (06/21/2023)  Housing: Low Risk  (06/21/2023)  Transportation Needs: No Transportation Needs (06/21/2023)  Utilities: Not At Risk (06/21/2023)  Social Connections: Moderately Isolated (06/21/2023)  Tobacco Use: Medium Risk (06/21/2023)    Readmission Risk Interventions     No data to display

## 2023-08-08 NOTE — Plan of Care (Signed)

## 2023-08-08 NOTE — Progress Notes (Signed)
  Progress Note   Patient: Dennis [name] WUJ:[id number] DOB: Apr 11, 1946 DOA: 06/21/2023     47 DOS: the patient was seen and examined on 08/08/2023   Brief hospital course: 78 years old male with past medical history of hyperlipidemia, BPH, HTN, dementia, RA, COPD, OSA not on CPAP, and CVA who presented to the hospital on 2/1 with mental status and dysarthria. He was found to have an acute infarct in the L posterior subinsular white matter on MRI. He and his significant other both have dementia, awaiting guardianship from social services.   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 And s/p stroke workup A1c 5.4 LDL 112 target less than 70, CT head MRI brain and CT angio head and neck-no LVO, 60% stenosis right proximal ICA and 50% on the left proximal ICA.  06/21/23 Echo showed EF 20-25%. Continue Eliquis and Lipitor and PT/OT. He is awaiting for skilled nursing facility. Neurologically intact, alert and ambulating. Neuro advised to repeat echo in 3 months, and continue Eliquis until EF more than 30-35%. Cardiology recommended cardiac event monitor to assess Afib in setting of stroke, would plan to arrange close to d/c.   Hypertension BP controlled on Coreg/Imdur   Hyperlipidemia: Continue Lipitor   Dysphagia Had MBS on 2/11 showing significantly improved swallow function associated with improved cognition.  Tolerating regular diet.   Dementia Inappropriate behaviors: Psychiatry, initial consult 2/24 agitation, and reevaluated on 3/13 due to concern for hypersexuality and inappropriate touching of nursing staff> recommended fluoxetine, monitoring of patient's behavior.  On Seroquel 25 in a.m. 50 at night, fluoxetine started 10/13. Remains appropriately conversant this AM, ambulating around the unit   HFrEF Euvolemic. Continue Coreg, Imdur. His EF 20-  25% with global hypokinesis. Continue coreg, imdur   BPH Continue Proscar/Flomax    Stage 3a CKD Last labs w/ stable renal function.       Subjective: No complaints this AM  Physical Exam: Vitals:   08/07/23 2052 08/08/23 0605 08/08/23 0816 08/08/23 1439  BP: (!) 123/54 (!) 164/68 (!) 140/67 (!) 149/77  Pulse: 89 77 92   Resp: 20 18 17    Temp: 98.8 F (37.1 C) 98.4 F (36.9 C) 97.6 F (36.4 C)   TempSrc: Oral Oral Oral   SpO2: 94% 98% 98%   Weight:      Height:       General exam: Awake, laying in bed, in nad Respiratory system: Normal respiratory effort, no wheezing Cardiovascular system: regular rate, s1, s2 Gastrointestinal system: Soft, nondistended, positive BS Central nervous system: CN2-12 grossly intact, strength intact Extremities: Perfused, no clubbing Skin: Normal skin turgor, no notable skin lesions seen Psychiatry: Mood normal // no visual hallucinations   Data Reviewed:  There are no new results to review at this time.  Family Communication: Pt in room, family not at bedside  Disposition: Status is: Inpatient Remains inpatient appropriate because: severity of illness  Planned Discharge Destination: Skilled nursing facility    Author: Rickey Barbara, MD 08/08/2023 4:54 PM  For on call review [email or web address].

## 2023-08-08 NOTE — Plan of Care (Signed)
  Problem: Health Behavior/Discharge Planning: Goal: Ability to manage health-related needs will improve Outcome: Progressing   Problem: Coping: Goal: Level of anxiety will decrease Outcome: Progressing   Problem: Elimination: Goal: Will not experience complications related to bowel motility Outcome: Progressing   Problem: Pain Managment: Goal: General experience of comfort will improve and/or be controlled Outcome: Progressing

## 2023-08-09 DIAGNOSIS — I639 Cerebral infarction, unspecified: Secondary | ICD-10-CM | POA: Diagnosis not present

## 2023-08-09 NOTE — Progress Notes (Signed)
  Progress Note   Patient: Dennis [name] ION:[id number] DOB: Jan 27, 1946 DOA: 06/21/2023     48 DOS: the patient was seen and examined on 08/09/2023   Brief hospital course: 78 years old male with past medical history of hyperlipidemia, BPH, HTN, dementia, RA, COPD, OSA not on CPAP, and CVA who presented to the hospital on 2/1 with mental status and dysarthria. He was found to have an acute infarct in the L posterior subinsular white matter on MRI. He and his significant other both have dementia, awaiting guardianship from social services.   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 And s/p stroke workup A1c 5.4 LDL 112 target less than 70, CT head MRI brain and CT angio head and neck-no LVO, 60% stenosis right proximal ICA and 50% on the left proximal ICA.  06/21/23 Echo showed EF 20-25%. Continue Eliquis and Lipitor and PT/OT. He is awaiting for skilled nursing facility. Neurologically intact, alert and ambulating. Neuro advised to repeat echo in 3 months, and continue Eliquis until EF more than 30-35%. Cardiology recommended cardiac event monitor to assess Afib in setting of stroke, would plan to arrange close to d/c. Stable this AM   Hypertension BP controlled on Coreg/Imdur   Hyperlipidemia: Continue Lipitor   Dysphagia Had MBS on 2/11 showing significantly improved swallow function associated with improved cognition.  Tolerating regular diet.   Dementia Inappropriate behaviors: Psychiatry, initial consult 2/24 agitation, and reevaluated on 3/13 due to concern for hypersexuality and inappropriate touching of nursing staff> recommended fluoxetine, monitoring of patient's behavior.  On Seroquel 25 in a.m. 50 at night, fluoxetine started 10/13. Remains appropriately conversant this AM, ambulating around the unit   HFrEF Euvolemic. Continue Coreg,  Imdur. His EF 20- 25% with global hypokinesis. Continue coreg, imdur   BPH Continue Proscar/Flomax    Stage 3a CKD Last labs w/ stable renal function.       Subjective: Without complaints today  Physical Exam: Vitals:   08/09/23 0450 08/09/23 0853 08/09/23 0904 08/09/23 1514  BP: (!) 137/58 (!) 133/107 134/84 (!) 158/71  Pulse: 72 74  80  Resp: 16 17  18   Temp: 97.8 F (36.6 C) 97.8 F (36.6 C)  97.7 F (36.5 C)  TempSrc: Oral Oral  Oral  SpO2: 95% 97%  98%  Weight:      Height:       General exam: Conversant, in no acute distress Respiratory system: normal chest rise, clear, no audible wheezing Cardiovascular system: regular rhythm, s1-s2 Gastrointestinal system: Nondistended, nontender, pos BS Central nervous system: No seizures, no tremors Extremities: No cyanosis, no joint deformities Skin: No rashes, no pallor Psychiatry: Affect normal // no auditory hallucinations   Data Reviewed:  There are no new results to review at this time.  Family Communication: Pt in room, family not at bedside  Disposition: Status is: Inpatient Remains inpatient appropriate because: severity of illness  Planned Discharge Destination: Skilled nursing facility    Author: Rickey Barbara, MD 08/09/2023 5:36 PM  For on call review [email or web address].

## 2023-08-09 NOTE — Plan of Care (Signed)
  Problem: Health Behavior/Discharge Planning: Goal: Ability to manage health-related needs will improve Outcome: Progressing   Problem: Pain Managment: Goal: General experience of comfort will improve and/or be controlled Outcome: Progressing   Problem: Safety: Goal: Ability to remain free from injury will improve Outcome: Progressing

## 2023-08-09 NOTE — Plan of Care (Signed)
  Problem: Health Behavior/Discharge Planning: Goal: Ability to manage health-related needs will improve Outcome: Progressing   Problem: Clinical Measurements: Goal: Ability to maintain clinical measurements within normal limits will improve Outcome: Progressing Goal: Respiratory complications will improve Outcome: Progressing Goal: Cardiovascular complication will be avoided Outcome: Progressing   Problem: Coping: Goal: Level of anxiety will decrease Outcome: Progressing   Problem: Elimination: Goal: Will not experience complications related to bowel motility Outcome: Progressing   Problem: Pain Managment: Goal: General experience of comfort will improve and/or be controlled Outcome: Progressing   Problem: Safety: Goal: Ability to remain free from injury will improve Outcome: Progressing

## 2023-08-10 DIAGNOSIS — I639 Cerebral infarction, unspecified: Secondary | ICD-10-CM | POA: Diagnosis not present

## 2023-08-10 MED ORDER — ACETAMINOPHEN 325 MG PO TABS: 650.0000 mg | ORAL_TABLET | Freq: Four times a day (QID) | ORAL | Status: DC | PRN

## 2023-08-10 NOTE — Progress Notes (Signed)
  Progress Note   Patient: Dennis [name] JXB:[id number] DOB: 05/13/1946 DOA: 06/21/2023     49 DOS: the patient was seen and examined on 08/10/2023   Brief hospital course: 78 years old male with past medical history of hyperlipidemia, BPH, HTN, dementia, RA, COPD, OSA not on CPAP, and CVA who presented to the hospital on 2/1 with mental status and dysarthria. He was found to have an acute infarct in the L posterior subinsular white matter on MRI. He and his significant other both have dementia, awaiting guardianship from social services.   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 And s/p stroke workup A1c 5.4 LDL 112 target less than 70, CT head MRI brain and CT angio head and neck-no LVO, 60% stenosis right proximal ICA and 50% on the left proximal ICA.  06/21/23 Echo showed EF 20-25%. Continue Eliquis and Lipitor and PT/OT. He is awaiting for skilled nursing facility. Neurologically intact, alert and ambulating. Neuro advised to repeat echo in 3 months, and continue Eliquis until EF more than 30-35%. Cardiology recommended cardiac event monitor to assess Afib in setting of stroke, would plan to arrange close to d/c. Remains stable this AM   Hypertension BP controlled on Coreg/Imdur   Hyperlipidemia: Continue Lipitor   Dysphagia Had MBS on 2/11 showing significantly improved swallow function associated with improved cognition.  Tolerating regular diet.   Dementia Inappropriate behaviors: Psychiatry, initial consult 2/24 agitation, and reevaluated on 3/13 due to concern for hypersexuality and inappropriate touching of nursing staff> recommended fluoxetine, monitoring of patient's behavior.  On Seroquel 25 in a.m. 50 at night, fluoxetine started 10/13. Remains appropriately conversant this AM, ambulating around the unit   HFrEF Euvolemic. Continue  Coreg, Imdur. His EF 20- 25% with global hypokinesis. Continue coreg, imdur   BPH Continue Proscar/Flomax    Stage 3a CKD Last labs w/ stable renal function.       Subjective: No complaints. Ambulating around unit regularly  Physical Exam: Vitals:   08/09/23 1514 08/09/23 2232 08/10/23 0736 08/10/23 1523  BP: (!) 158/71 (!) 146/68 (!) 164/43 (!) 157/71  Pulse: 80 73 83 84  Resp: 18 17 17 18   Temp: 97.7 F (36.5 C) 98.4 F (36.9 C)  97.6 F (36.4 C)  TempSrc: Oral Oral  Oral  SpO2: 98% 96% 99% 96%  Weight:      Height:       General exam: Awake, laying in bed, in nad Respiratory system: Normal respiratory effort, no wheezing Cardiovascular system: regular rate, s1, s2 Gastrointestinal system: Soft, nondistended, positive BS Central nervous system: CN2-12 grossly intact, strength intact Extremities: Perfused, no clubbing Skin: Normal skin turgor, no notable skin lesions seen Psychiatry: Mood normal // no visual hallucinations   Data Reviewed:  There are no new results to review at this time.  Family Communication: Pt in room, family not at bedside  Disposition: Status is: Inpatient Remains inpatient appropriate because: severity of illness  Planned Discharge Destination: Skilled nursing facility    Author: Rickey Barbara, MD 08/10/2023 5:49 PM  For on call review [email or web address].

## 2023-08-10 NOTE — Plan of Care (Signed)
  Problem: Health Behavior/Discharge Planning: Goal: Ability to manage health-related needs will improve Outcome: Progressing   Problem: Clinical Measurements: Goal: Ability to maintain clinical measurements within normal limits will improve Outcome: Progressing Goal: Respiratory complications will improve Outcome: Progressing Goal: Cardiovascular complication will be avoided Outcome: Progressing   Problem: Coping: Goal: Level of anxiety will decrease Outcome: Progressing   Problem: Elimination: Goal: Will not experience complications related to bowel motility Outcome: Progressing   Problem: Pain Managment: Goal: General experience of comfort will improve and/or be controlled Outcome: Progressing   Problem: Safety: Goal: Ability to remain free from injury will improve Outcome: Progressing

## 2023-08-10 NOTE — Progress Notes (Signed)
 Mobility Specialist Progress Note:   08/10/23 0910  Mobility  Activity Ambulated independently in hallway  Level of Assistance Modified independent, requires aide device or extra time  Assistive Device Four point cane  Distance Ambulated (ft) 300 ft  Activity Response Tolerated well  Mobility Referral Yes  Mobility visit 1 Mobility  Mobility Specialist Start Time (ACUTE ONLY) 0910  Mobility Specialist Stop Time (ACUTE ONLY) 0925  Mobility Specialist Time Calculation (min) (ACUTE ONLY) 15 min   Pt agreeable to mobility session. Required no physical assistance throughout ambulation with cane. No unsteadiness noted. Pt back in chair with all needs met.   Addison Lank Mobility Specialist Please contact via [employer] or  Rehab office at [phone]

## 2023-08-11 DIAGNOSIS — I639 Cerebral infarction, unspecified: Secondary | ICD-10-CM | POA: Diagnosis not present

## 2023-08-11 NOTE — Progress Notes (Signed)
  Progress Note   Patient: Dennis [name] UJW:[id number] DOB: July 19, 1945 DOA: 06/21/2023     50 DOS: the patient was seen and examined on 08/11/2023   Brief hospital course: 78 years old male with past medical history of hyperlipidemia, BPH, HTN, dementia, RA, COPD, OSA not on CPAP, and CVA who presented to the hospital on 2/1 with mental status and dysarthria. He was found to have an acute infarct in the L posterior subinsular white matter on MRI. He and his significant other both have dementia, awaiting guardianship from social services.   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 And s/p stroke workup A1c 5.4 LDL 112 target less than 70, CT head MRI brain and CT angio head and neck-no LVO, 60% stenosis right proximal ICA and 50% on the left proximal ICA.  06/21/23 Echo showed EF 20-25%. Continue Eliquis and Lipitor and PT/OT. He is awaiting for skilled nursing facility. Remains neurologically intact, alert and ambulating. Neuro advised to repeat echo in 3 months, and continue Eliquis until EF more than 30-35%. Cardiology recommended cardiac event monitor to assess Afib in setting of stroke, would plan to arrange close to d/c. Currently remains stable this AM   Hypertension BP controlled on Coreg/Imdur   Hyperlipidemia: Continue Lipitor   Dysphagia Had MBS on 2/11 showing significantly improved swallow function associated with improved cognition.  Tolerating regular diet.   Dementia Inappropriate behaviors: Psychiatry, initial consult 2/24 agitation, and reevaluated on 3/13 due to concern for hypersexuality and inappropriate touching of nursing staff> recommended fluoxetine, monitoring of patient's behavior.  On Seroquel 25 in a.m. 50 at night, fluoxetine started 10/13. Remains appropriately conversant this AM, ambulating around the unit    HFrEF Euvolemic. Continue Coreg, Imdur. His EF 20- 25% with global hypokinesis. Continue coreg, imdur   BPH Continue Proscar/Flomax    Stage 3a CKD Last labs w/ stable renal function.       Subjective: Without complaints this AM  Physical Exam: Vitals:   08/10/23 1523 08/10/23 2220 08/11/23 0650 08/11/23 1048  BP: (!) 157/71 117/61 (!) 142/64 (!) 127/55  Pulse: 84 77 73 80  Resp: 18 16 17 16   Temp: 97.6 F (36.4 C) 98.1 F (36.7 C)  (!) 97.4 F (36.3 C)  TempSrc: Oral Oral  Oral  SpO2: 96% 95% 98% 96%  Weight:      Height:       General exam: Conversant, in no acute distress Respiratory system: normal chest rise, clear, no audible wheezing Cardiovascular system: regular rhythm, s1-s2 Gastrointestinal system: Nondistended, nontender, pos BS Central nervous system: No seizures, no tremors Extremities: No cyanosis, no joint deformities Skin: No rashes, no pallor Psychiatry: Affect normal // no auditory hallucinations   Data Reviewed:  There are no new results to review at this time.  Family Communication: Pt in room, family not at bedside  Disposition: Status is: Inpatient Remains inpatient appropriate because: severity of illness  Planned Discharge Destination: Skilled nursing facility    Author: Rickey Barbara, MD 08/11/2023 4:40 PM  For on call review [email or web address].

## 2023-08-11 NOTE — Progress Notes (Signed)
 Mobility Specialist Progress Note:   08/11/23 1150  Mobility  Activity Ambulated independently in hallway  Level of Assistance Modified independent, requires aide device or extra time  Assistive Device Cane  Distance Ambulated (ft) 300 ft  Activity Response Tolerated well  Mobility Referral Yes  Mobility visit 1 Mobility  Mobility Specialist Start Time (ACUTE ONLY) 1137  Mobility Specialist Stop Time (ACUTE ONLY) 1145  Mobility Specialist Time Calculation (min) (ACUTE ONLY) 8 min   Pt found pleasant and agreeable to mobility. No unsteadiness present with cane during ambulation, asx throughout. Pt returned to chair with all needs met.   [name]  Mobility Specialist Please contact via [employer] office at [phone]

## 2023-08-11 NOTE — Plan of Care (Signed)
  Problem: Health Behavior/Discharge Planning: Goal: Ability to manage health-related needs will improve Outcome: Progressing   Problem: Clinical Measurements: Goal: Ability to maintain clinical measurements within normal limits will improve Outcome: Progressing Goal: Respiratory complications will improve Outcome: Progressing Goal: Cardiovascular complication will be avoided Outcome: Progressing   Problem: Coping: Goal: Level of anxiety will decrease Outcome: Progressing   Problem: Elimination: Goal: Will not experience complications related to bowel motility Outcome: Progressing   Problem: Pain Managment: Goal: General experience of comfort will improve and/or be controlled Outcome: Progressing   Problem: Safety: Goal: Ability to remain free from injury will improve Outcome: Progressing

## 2023-08-12 DIAGNOSIS — I639 Cerebral infarction, unspecified: Secondary | ICD-10-CM | POA: Diagnosis not present

## 2023-08-12 NOTE — Plan of Care (Signed)
  Problem: Health Behavior/Discharge Planning: Goal: Ability to manage health-related needs will improve Outcome: Progressing   Problem: Clinical Measurements: Goal: Ability to maintain clinical measurements within normal limits will improve Outcome: Progressing Goal: Respiratory complications will improve Outcome: Progressing Goal: Cardiovascular complication will be avoided Outcome: Progressing   Problem: Coping: Goal: Level of anxiety will decrease Outcome: Progressing   Problem: Elimination: Goal: Will not experience complications related to bowel motility Outcome: Progressing   Problem: Pain Managment: Goal: General experience of comfort will improve and/or be controlled Outcome: Progressing   Problem: Safety: Goal: Ability to remain free from injury will improve Outcome: Progressing

## 2023-08-12 NOTE — TOC Progression Note (Signed)
 Transition of Care [facility]) - Progression Note    Patient Details  Name: NOVA EVETT MRN: [id number] Date of Birth: 14-Jul-1945  [facility] of [facility]) CM/SW Contact  Mearl Latin, LCSW Phone Number: 08/12/2023, 2:42 PM  Clinical Narrative:    CSW reached out to [employer] at [facility]; pending paperwork with patient's Guardian.    Expected Discharge Plan: Skilled Nursing Facility Barriers to Discharge: Insurance Authorization, SNF Pending bed offer, Continued Medical Work up, Unsafe home situation  Expected Discharge Plan and Services     Post Acute Care Choice: Skilled Nursing Facility Living arrangements for the past 2 months: Single Family Home                                       Social Determinants of Health (SDOH) Interventions SDOH Screenings   Food Insecurity: No Food Insecurity (06/21/2023)  Housing: Low Risk  (06/21/2023)  Transportation Needs: No Transportation Needs (06/21/2023)  Utilities: Not At Risk (06/21/2023)  Social Connections: Moderately Isolated (06/21/2023)  Tobacco Use: Medium Risk (06/21/2023)    Readmission Risk Interventions     No data to display

## 2023-08-12 NOTE — Progress Notes (Signed)
  Progress Note   Patient: Dennis [name] ZOX:[id number] DOB: 1945-06-18 DOA: 06/21/2023     51 DOS: the patient was seen and examined on 08/12/2023   Brief hospital course: 78 years old male with past medical history of hyperlipidemia, BPH, HTN, dementia, RA, COPD, OSA not on CPAP, and CVA who presented to the hospital on 2/1 with mental status and dysarthria. He was found to have an acute infarct in the L posterior subinsular white matter on MRI. He and his significant other both have dementia, awaiting guardianship from social services.   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 And s/p stroke workup A1c 5.4 LDL 112 target less than 70, CT head MRI brain and CT angio head and neck-no LVO, 60% stenosis right proximal ICA and 50% on the left proximal ICA.  06/21/23 Echo showed EF 20-25%. Continue Eliquis and Lipitor and PT/OT. He is awaiting for skilled nursing facility. Remains neurologically intact, alert and ambulating. Neuro advised to repeat echo in 3 months, and continue Eliquis until EF more than 30-35%. Cardiology recommended cardiac event monitor to assess Afib in setting of stroke, would plan to arrange close to d/c. Would reach out to Cardiology once dispo plans are nearly finalized Currently remains stable this AM. Pt ambulating   Hypertension BP controlled on Coreg/Imdur   Hyperlipidemia: Continue Lipitor   Dysphagia Had MBS on 2/11 showing significantly improved swallow function associated with improved cognition.  Tolerating regular diet.   Dementia Inappropriate behaviors: Psychiatry, initial consult 2/24 agitation, and reevaluated on 3/13 due to concern for hypersexuality and inappropriate touching of nursing staff> recommended fluoxetine, monitoring of patient's behavior.  On Seroquel 25 in a.m. 50 at night, fluoxetine started  10/13. Remains appropriately conversant this AM, continues to ambulate around the unit   HFrEF Euvolemic. Continue Coreg, Imdur. His EF 20- 25% with global hypokinesis. Continue coreg, imdur   BPH Continue Proscar/Flomax    Stage 3a CKD Last labs w/ stable renal function.       Subjective: No complaints this AM  Physical Exam: Vitals:   08/11/23 2145 08/12/23 0544 08/12/23 0828 08/12/23 1405  BP: 121/71 (!) 107/55 (!) 132/113 124/69  Pulse: 80 71 71   Resp: 18  17   Temp: 98.6 F (37 C) 98.8 F (37.1 C) 98.5 F (36.9 C)   TempSrc:      SpO2: 97% 94% 95%   Weight:      Height:       General exam: Awake, laying in bed, in nad Respiratory system: Normal respiratory effort, no wheezing Cardiovascular system: regular rate, s1, s2 Gastrointestinal system: Soft, nondistended, positive BS Central nervous system: CN2-12 grossly intact, strength intact Extremities: Perfused, no clubbing Skin: Normal skin turgor, no notable skin lesions seen Psychiatry: Mood normal // no visual hallucinations   Data Reviewed:  There are no new results to review at this time.  Family Communication: Pt in room, family not at bedside  Disposition: Status is: Inpatient Remains inpatient appropriate because: severity of illness  Planned Discharge Destination: Skilled nursing facility    Author: Rickey Barbara, MD 08/12/2023 4:10 PM  For on call review [email or web address].

## 2023-08-13 DIAGNOSIS — I639 Cerebral infarction, unspecified: Secondary | ICD-10-CM | POA: Diagnosis not present

## 2023-08-13 LAB — COMPREHENSIVE METABOLIC PANEL
ALT: 20 U/L (ref 0–44)
AST: 21 U/L (ref 15–41)
Albumin: 2.9 g/dL — ABNORMAL LOW (ref 3.5–5.0)
Alkaline Phosphatase: 22 U/L — ABNORMAL LOW (ref 38–126)
Anion gap: 7 (ref 5–15)
BUN: 24 mg/dL — ABNORMAL HIGH (ref 8–23)
CO2: 26 mmol/L (ref 22–32)
Calcium: 9.5 mg/dL (ref 8.9–10.3)
Chloride: 108 mmol/L (ref 98–111)
Creatinine, Ser: 1.2 mg/dL (ref 0.61–1.24)
GFR, Estimated: >60 mL/min (ref >60)
Glucose, Bld: 91 mg/dL (ref 70–99)
Potassium: 4.6 mmol/L (ref 3.5–5.1)
Sodium: 141 mmol/L (ref 135–145)
Total Bilirubin: 0.6 mg/dL (ref 0.0–1.2)
Total Protein: 6.2 g/dL — ABNORMAL LOW (ref 6.5–8.1)

## 2023-08-13 LAB — CBC
HCT: 35 % — ABNORMAL LOW (ref 39.0–52.0)
Hemoglobin: 11.1 g/dL — ABNORMAL LOW (ref 13.0–17.0)
MCH: 27.3 pg (ref 26.0–34.0)
MCHC: 31.7 g/dL (ref 30.0–36.0)
MCV: 86 fL (ref 80.0–100.0)
Platelets: 247 10*3/uL (ref 150–400)
RBC: 4.07 MIL/uL — ABNORMAL LOW (ref 4.22–5.81)
RDW: 14.7 % (ref 11.5–15.5)
WBC: 9.2 10*3/uL (ref 4.0–10.5)
nRBC: 0 % (ref 0.0–0.2)

## 2023-08-13 MED ORDER — HYDRALAZINE HCL 25 MG PO TABS: 25.0000 mg | ORAL_TABLET | Freq: Three times a day (TID) | ORAL | 1 refills | Status: DC

## 2023-08-13 NOTE — TOC Progression Note (Signed)
 Transition of Care [facility]) - Progression Note    Patient Details  Name: Dennis [name] MRN: [id number] Date of Birth: 02-03-1946  [facility]) CM/SW Contact  Mearl Latin, LCSW Phone Number: 08/13/2023, 1:15 PM  Clinical Narrative:    CSW received call from [facility] at [facility] [phone]). She stated she is able to accept patient tomorrow. She requests CSW send DC Summary, Fl2, and TB test to f. [phone]. Will require PTAR. CSW sent update to Alda Lea [phone]), patient's APS interim Guardian. Sheriff just brought paperwork for a hearing of appointing guardian.   Expected Discharge Plan: Skilled Nursing Facility Barriers to Discharge: Insurance Authorization, SNF Pending bed offer, Continued Medical Work up, Unsafe home situation  Expected Discharge Plan and Services     Post Acute Care Choice: Skilled Nursing Facility Living arrangements for the past 2 months: Single Family Home                                       Social Determinants of Health (SDOH) Interventions SDOH Screenings   Food Insecurity: No Food Insecurity (06/21/2023)  Housing: Low Risk  (06/21/2023)  Transportation Needs: No Transportation Needs (06/21/2023)  Utilities: Not At Risk (06/21/2023)  Social Connections: Moderately Isolated (06/21/2023)  Tobacco Use: Medium Risk (06/21/2023)    Readmission Risk Interventions     No data to display

## 2023-08-13 NOTE — Plan of Care (Signed)
  Problem: Pain Managment: Goal: General experience of comfort will improve and/or be controlled Outcome: Progressing   Problem: Safety: Goal: Ability to remain free from injury will improve Outcome: Progressing

## 2023-08-13 NOTE — Progress Notes (Signed)
 Progress Note   Patient: Dennis [name]:[id number] DOB: 09/22/1945 DOA: 06/21/2023     52 DOS: the patient was seen and examined on 08/13/2023    Subjective:  The patient was seen and examined this morning. No complaints no issues overnight  Brief hospital course: 78 years old male with past medical history of hyperlipidemia, BPH, HTN, dementia, RA, COPD, OSA not on CPAP, and CVA who presented to the hospital on 2/1 with mental status and dysarthria. He was found to have an acute infarct in the L posterior subinsular white matter on MRI. He and his significant other both have dementia, awaiting guardianship from social services.   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 And s/p stroke workup A1c 5.4 LDL 112 target less than 70, CT head MRI brain and CT angio head and neck-no LVO, 60% stenosis right proximal ICA and 50% on the left proximal ICA.  06/21/23 Echo showed EF 20-25%. Continue Eliquis and Lipitor and PT/OT. He is awaiting for skilled nursing facility. Remains neurologically intact, alert and ambulating. Neuro advised to repeat echo in 3 months, and continue Eliquis until EF more than 30-35%. Cardiology recommended cardiac event monitor to assess Afib in setting of stroke, would plan to arrange close to d/c. Would reach out to Cardiology once dispo plans are nearly finalized Currently remains stable this AM. Pt ambulating   Hypertension BP controlled on Coreg/Imdur   Hyperlipidemia: Continue Lipitor   Dysphagia Had MBS on 2/11 showing significantly improved swallow function associated with improved cognition.  Tolerating regular diet.   Dementia Inappropriate behaviors: Psychiatry, initial consult 2/24 agitation, and reevaluated on 3/13 due to concern for hypersexuality and inappropriate touching of nursing staff> recommended  fluoxetine, monitoring of patient's behavior.  On Seroquel 25 in a.m. 50 at night, fluoxetine started 10/13. Remains appropriately conversant this AM, continues to ambulate around the unit   HFrEF Euvolemic. Continue Coreg, Imdur. His EF 20- 25% with global hypokinesis. Continue coreg, imdur   BPH Continue Proscar/Flomax    Stage 3a CKD Last labs w/ stable renal function.      Physical Exam: Vitals:   08/12/23 2245 08/13/23 0629 08/13/23 0752 08/13/23 0845  BP: (!) 151/60 (!) 143/69 (!) 167/70 (!) 167/70  Pulse:  77 91 91  Resp:  18 16   Temp:  98.2 F (36.8 C) (!) 97.5 F (36.4 C)   TempSrc:  Oral Oral   SpO2:  95% 94%   Weight:      Height:            General:  AAO x 1,  cooperative, no distress; pleasantly confused  HEENT:  Normocephalic, PERRL, otherwise with in Normal limits   Neuro:  CNII-XII intact. , normal motor and sensation, reflexes intact   Lungs:   Clear to auscultation BL, Respirations unlabored,  No wheezes / crackles  Cardio:    S1/S2, RRR, No murmure, No Rubs or Gallops   Abdomen:  Soft, non-tender, bowel sounds active all four quadrants, no guarding or peritoneal signs.  Muscular  skeletal:  Limited exam -global generalized weaknesses - in bed, able to move all 4 extremities,   2+ pulses,  symmetric, No pitting edema  Skin:  Dry, warm to touch, negative for any Rashes,  Wounds: Please see nursing documentation         Data Reviewed:  There are no new results  to review at this time.  Family Communication: Pt in room, family not at bedside  Disposition: Status is: Inpatient Remains inpatient appropriate because: severity of illness  Planned Discharge Destination: Skilled nursing facility >>>>> DC in a.m. to memory unit      Author: Kendell Bane, MD 08/13/2023 1:21 PM  For on call review [email or web address].

## 2023-08-13 NOTE — Discharge Summary (Incomplete)
 Physician Discharge Summary   Patient: Dennis [name] MRN: [id number] DOB: 08/22/1945  Admit date:     06/21/2023  Discharge date: 08/14/23  Discharge Physician: Kendell Bane   PCP: Pcp, No   The patient was seen and examined stable to be discharged to SNF-memory unit  Remained stable physically and mentally   Recommendations at discharge:   Follow-up with PCP in 2-4 weeks Follow-up with neurology as scheduled Recommending referral to palliative care as soon as possible  Discharge Diagnoses: Principal Problem:   Acute CVA (cerebrovascular accident) (HCC) Active Problems:   HLD (hyperlipidemia)   HTN (hypertension)   BPH (benign prostatic hyperplasia)   Dysphagia   Dementia with behavioral disturbance (HCC)   Agitation  Resolved Problems:   * No resolved hospital problems. *  Hospital Course: 78 years old male with past medical history of hyperlipidemia, BPH, HTN, dementia, RA, COPD, OSA not on CPAP, and CVA who presented to the hospital on 2/1 with mental status and dysarthria. He was found to have an acute infarct in the L posterior subinsular white matter on MRI. He and his significant other both have dementia, awaiting guardianship from social services.   78 years old male with past medical history of hyperlipidemia, BPH, HTN, dementia, RA, COPD, OSA not on CPAP, and CVA who presented to the hospital on 2/1 with mental status and dysarthria. He was found to have an acute infarct in the L posterior subinsular white matter on MRI. He and his significant other both have dementia, awaiting guardianship from social services.     Acute CVA-acute infarct in the superior left frontal cortex, punctuate acute infarct in the left posterior subinsular white matter: Presented with AMS and dysarthria. And s/p stroke workup A1c 5.4 LDL 112 target less than 70, CT head MRI brain and CT angio head and neck-no LVO, 60% stenosis right proximal ICA and 50% on the left proximal ICA.  06/21/23  Echo showed EF 20-25%. Continue Eliquis and Lipitor and PT/OT. He is awaiting for skilled nursing facility. Remains neurologically intact, alert and ambulating. Neuro advised to repeat echo in 3 months, and continue Eliquis until EF more than 30-35%. Cardiology recommended cardiac event monitor to assess Afib in setting of stroke, would plan to arrange close to d/c. Would reach out to Cardiology once dispo plans are nearly finalized Remained stable physically and mentally.   Hypertension BP controlled on Coreg/Imdur   Hyperlipidemia: Continue Lipitor   Dysphagia Had MBS on 2/11 showing significantly improved swallow function associated with improved cognition.  Tolerating regular diet.   Dementia Inappropriate behaviors: Psychiatry, initial consult 2/24 agitation, and reevaluated on 3/13 due to concern for hypersexuality and inappropriate touching of nursing staff> recommended fluoxetine, monitoring of patient's behavior.  On Seroquel 25 in a.m. 50 at night, fluoxetine started 10/13. Remains appropriately conversant this AM, continues to ambulate around the unit   HFrEF Euvolemic. Continue Coreg, Imdur. His EF 20- 25% with global hypokinesis. Continue coreg, imdur   BPH Continue Proscar/Flomax    Stage 3a CKD Last labs w/ stable renal function.    Disposition: Skilled nursing facility-memory unit Diet recommendation:  Regular diet DISCHARGE MEDICATION: Allergies as of 08/14/2023       Reactions   Codeine    Shellfish Allergy         Medication List     STOP taking these medications    clopidogrel 75 MG tablet Commonly known as: PLAVIX   methotrexate 50 MG/2ML injection   omeprazole 20  MG capsule Commonly known as: PRILOSEC       TAKE these medications    apixaban 5 MG Tabs tablet Commonly known as: ELIQUIS Take 1 tablet (5 mg total) by mouth 2 (two) times daily.   atorvastatin 40 MG tablet Commonly known as: LIPITOR Take 1 tablet (40 mg total) by mouth  daily.   carvedilol 3.125 MG tablet Commonly known as: COREG Take 1 tablet (3.125 mg total) by mouth 2 (two) times daily with a meal.   docusate sodium 100 MG capsule Commonly known as: COLACE Take 1 capsule (100 mg total) by mouth 2 (two) times daily.   finasteride 5 MG tablet Commonly known as: PROSCAR Take 1 tablet (5 mg total) by mouth daily.   FLUoxetine 10 MG capsule Commonly known as: PROZAC Take 1 capsule (10 mg total) by mouth daily.   folic acid 1 MG tablet Commonly known as: FOLVITE Take 1 tablet (1 mg total) by mouth daily.   hydrALAZINE 25 MG tablet Commonly known as: APRESOLINE Take 1 tablet (25 mg total) by mouth every 8 (eight) hours. What changed:  medication strength how much to take   isosorbide mononitrate 30 MG 24 hr tablet Commonly known as: IMDUR Take 1 tablet (30 mg total) by mouth daily.   pantoprazole 40 MG tablet Commonly known as: PROTONIX Take 1 tablet (40 mg total) by mouth daily.   polyethylene glycol 17 g packet Commonly known as: MIRALAX / GLYCOLAX Take 17 g by mouth daily as needed.   QUEtiapine 50 MG tablet Commonly known as: SEROQUEL Take 1 tablet (50 mg total) by mouth daily at 6 PM.   QUEtiapine 25 MG tablet Commonly known as: SEROQUEL Take 1 tablet (25 mg total) by mouth every morning.   senna-docusate 8.6-50 MG tablet Commonly known as: Senokot-S Take 1 tablet by mouth at bedtime as needed for mild constipation.   tamsulosin 0.4 MG Caps capsule Commonly known as: FLOMAX Take 1 capsule (0.4 mg total) by mouth daily.        Follow-up Information     [facility]. Schedule an appointment as soon as possible for a visit in 1 month(s).   Specialty: Neurology Why: stroke clinic Contact information: [address] [place] [address] [phone]               Discharge Exam: Ceasar Mons Weights   06/21/23 0000  Weight: 60 kg     General:  AAO x 3,   cooperative, no distress;   HEENT:  Normocephalic, PERRL, otherwise with in Normal limits   Neuro:  CNII-XII intact. , normal motor and sensation, reflexes intact   Lungs:   Clear to auscultation BL, Respirations unlabored,  No wheezes / crackles  Cardio:    S1/S2, RRR, No murmure, No Rubs or Gallops   Abdomen:  Soft, non-tender, bowel sounds active all four quadrants, no guarding or peritoneal signs.  Muscular  skeletal:  Limited exam -global generalized weaknesses - in bed, able to move all 4 extremities,   2+ pulses,  symmetric, No pitting edema  Skin:  Dry, warm to touch, negative for any Rashes,  Wounds: Please see nursing documentation         Condition at discharge: good  The results of significant diagnostics from this hospitalization (including imaging, microbiology, ancillary and laboratory) are listed below for reference.   Imaging Studies: No results found.  Microbiology: Results for orders placed or performed during the hospital encounter of 06/21/23  SARS Coronavirus 2 by RT PCR (hospital order, performed in [facility] hospital lab) *cepheid single result test* Anterior Nasal Swab     Status: None   Collection Time: 08/05/23  3:17 PM   Specimen: Anterior Nasal Swab  Result Value Ref Range Status   SARS Coronavirus 2 by RT PCR NEGATIVE NEGATIVE Final    Comment: Performed at [facility], [address] [place], [place] [address]    Labs: CBC: Recent Labs  Lab 08/13/23 0647  WBC 9.2  HGB 11.1*  HCT 35.0*  MCV 86.0  PLT 247   Basic Metabolic Panel: Recent Labs  Lab 08/13/23 0647  NA 141  K 4.6  CL 108  CO2 26  GLUCOSE 91  BUN 24*  CREATININE 1.20  CALCIUM 9.5   Liver Function Tests: Recent Labs  Lab 08/13/23 0647  AST 21  ALT 20  ALKPHOS 22*  BILITOT 0.6  PROT 6.2*  ALBUMIN 2.9*   CBG: No results for input(s): "GLUCAP" in the last 168 hours.  Discharge time spent: greater than 30 minutes.  Signed: Kendell Bane,  MD [facility] 08/14/2023

## 2023-08-14 DIAGNOSIS — I639 Cerebral infarction, unspecified: Secondary | ICD-10-CM | POA: Diagnosis not present

## 2023-08-14 NOTE — Plan of Care (Signed)
  Problem: Pain Managment: Goal: General experience of comfort will improve and/or be controlled Outcome: Progressing   Problem: Safety: Goal: Ability to remain free from injury will improve Outcome: Progressing

## 2023-08-14 NOTE — TOC Transition Note (Signed)
 Transition of Care [name]) - Discharge Note   Patient Details  Name: Dennis [name] MRN: [id number] Date of Birth: 1945/06/30  Transition of Care [name]) CM/SW Contact:  Deatra Robinson, [place] Phone Number: 08/14/2023, 10:44 AM   Clinical Narrative:  Pt for dc to [name] today. Spoke to [employer] at [facility] who confirmed they have received necessary paperwork and are prepared to admit pt today. Voicemail left for pt's interim legal guardian Hoy Morn [name] DSS [phone] notifying her of dc. RN provided with number for report and PTAR arranged for transport. SW signing off at dc.   Dellie Burns, MSW, LCSW [phone] (coverage)         Barriers to Discharge: Insurance Authorization, SNF Pending bed offer, Continued Medical Work up, Unsafe home situation   Patient Goals and CMS Choice            Discharge Placement                       Discharge Plan and Services Additional resources added to the After Visit Summary for       Post Acute Care Choice: Skilled Nursing Facility                               Social Drivers of Health (SDOH) Interventions SDOH Screenings   Food Insecurity: No Food Insecurity (06/21/2023)  Housing: Low Risk  (06/21/2023)  Transportation Needs: No Transportation Needs (06/21/2023)  Utilities: Not At Risk (06/21/2023)  Social Connections: Moderately Isolated (06/21/2023)  Tobacco Use: Medium Risk (06/21/2023)     Readmission Risk Interventions     No data to display

## 2023-08-14 NOTE — NC FL2 (Signed)
 [place] MEDICAID FL2 LEVEL OF CARE FORM     IDENTIFICATION  Patient Name: Dennis [name] Birthdate: [birth year] Sex: male Admission Date (Current Location): 06/21/2023  [facility] and [place] Number:  Producer, television/film/video and Address:  [facility] [place], [address], [place], [place] [address]      Provider Number: [id number]  Attending Physician Name and Address:  Kendell Bane, MD  Relative Name and Phone Number:  DSS Marlou Porch [phone]    Current Level of Care: Hospital Recommended Level of Care: Other (Comment) (Memory care) Prior Approval Number:    Date Approved/Denied:   PASRR Number: [id number]  Discharge Plan: Other (Comment) (memory care)    Current Diagnoses: Patient Active Problem List   Diagnosis Date Noted   Agitation 07/10/2023   Dysphagia 07/02/2023   Dementia with behavioral disturbance (HCC) 07/02/2023   Acute CVA (cerebrovascular accident) (HCC) 06/21/2023   BPH (benign prostatic hyperplasia) 06/21/2023   Altered mental state 02/22/2023   Difficulty walking 02/22/2023   HLD (hyperlipidemia) 06/02/2009   HTN (hypertension) 06/02/2009   AORTIC INSUFFICIENCY, MILD 06/02/2009   DYSPNEA ON EXERTION 06/02/2009   CHEST PAIN, ATYPICAL 06/02/2009    Orientation RESPIRATION BLADDER Height & Weight     [name]  Normal Continent Weight: 132 lb 4.4 oz (60 kg) Height:  5\' 4"  (162.6 cm)  BEHAVIORAL SYMPTOMS/MOOD NEUROLOGICAL BOWEL NUTRITION STATUS      Continent Diet (regular)  AMBULATORY STATUS COMMUNICATION OF NEEDS Skin   Supervision Verbally Normal                       Personal Care Assistance Level of Assistance  Bathing, Feeding, Dressing Bathing Assistance: Limited assistance Feeding assistance: Limited assistance Dressing Assistance: Limited assistance     Functional Limitations Info  Sight, Hearing Sight Info: Impaired Hearing Info: Impaired Speech Info: Adequate    SPECIAL CARE FACTORS  FREQUENCY                       Contractures Contractures Info: Not present    Additional Factors Info  Code Status, Allergies, Psychotropic Code Status Info: full Allergies Info: codeine, shellfish Psychotropic Info: Quetiapine, fluoxetine         Current Medications (08/14/2023):   Discharge Medications: STOP taking these medications     clopidogrel 75 MG tablet Commonly known as: PLAVIX    methotrexate 50 MG/2ML injection    omeprazole 20 MG capsule Commonly known as: PRILOSEC           TAKE these medications     apixaban 5 MG Tabs tablet Commonly known as: ELIQUIS Take 1 tablet (5 mg total) by mouth 2 (two) times daily.    atorvastatin 40 MG tablet Commonly known as: LIPITOR Take 1 tablet (40 mg total) by mouth daily.    carvedilol 3.125 MG tablet Commonly known as: COREG Take 1 tablet (3.125 mg total) by mouth 2 (two) times daily with a meal.    docusate sodium 100 MG capsule Commonly known as: COLACE Take 1 capsule (100 mg total) by mouth 2 (two) times daily.    finasteride 5 MG tablet Commonly known as: PROSCAR Take 1 tablet (5 mg total) by mouth daily.    FLUoxetine 10 MG capsule Commonly known as: PROZAC Take 1 capsule (10 mg total) by mouth daily.    folic acid 1 MG tablet Commonly known as: FOLVITE Take 1 tablet (1 mg total)  by mouth daily.    hydrALAZINE 25 MG tablet Commonly known as: APRESOLINE Take 1 tablet (25 mg total) by mouth every 8 (eight) hours. What changed:  medication strength how much to take    isosorbide mononitrate 30 MG 24 hr tablet Commonly known as: IMDUR Take 1 tablet (30 mg total) by mouth daily.    pantoprazole 40 MG tablet Commonly known as: PROTONIX Take 1 tablet (40 mg total) by mouth daily.    polyethylene glycol 17 g packet Commonly known as: MIRALAX / GLYCOLAX Take 17 g by mouth daily as needed.    QUEtiapine 50 MG tablet Commonly known as: SEROQUEL Take 1 tablet (50 mg total) by mouth  daily at 6 PM.    QUEtiapine 25 MG tablet Commonly known as: SEROQUEL Take 1 tablet (25 mg total) by mouth every morning.    senna-docusate 8.6-50 MG tablet Commonly known as: Senokot-S Take 1 tablet by mouth at bedtime as needed for mild constipation.    tamsulosin 0.4 MG Caps capsule Commonly known as: FLOMAX Take 1 capsule (0.4 mg total) by mouth daily.      Relevant Imaging Results:  Relevant Lab Results:   Additional Information SS# [id number] [place], [place]

## 2023-08-14 NOTE — Progress Notes (Signed)
 Called report to Nurse Crystal at [facility] [phone] who will be taking care of Mr. Holeman once he arrives. Nurse verbalized understanding and had no further questions

## 2023-08-14 NOTE — Discharge Summary (Signed)
 Physician Discharge Summary   Patient: Dennis [name] MRN: [id number] DOB: 08/22/1945  Admit date:     06/21/2023  Discharge date: 08/14/23  Discharge Physician: Kendell Bane   PCP: Pcp, No   The patient was seen and examined stable to be discharged to SNF-memory unit  Remained stable physically and mentally   Recommendations at discharge:   Follow-up with PCP in 2-4 weeks Follow-up with neurology as scheduled Recommending referral to palliative care as soon as possible  Discharge Diagnoses: Principal Problem:   Acute CVA (cerebrovascular accident) (HCC) Active Problems:   HLD (hyperlipidemia)   HTN (hypertension)   BPH (benign prostatic hyperplasia)   Dysphagia   Dementia with behavioral disturbance (HCC)   Agitation  Resolved Problems:   * No resolved hospital problems. *  Hospital Course: 78 years old male with past medical history of hyperlipidemia, BPH, HTN, dementia, RA, COPD, OSA not on CPAP, and CVA who presented to the hospital on 2/1 with mental status and dysarthria. He was found to have an acute infarct in the L posterior subinsular white matter on MRI. He and his significant other both have dementia, awaiting guardianship from social services.   78 years old male with past medical history of hyperlipidemia, BPH, HTN, dementia, RA, COPD, OSA not on CPAP, and CVA who presented to the hospital on 2/1 with mental status and dysarthria. He was found to have an acute infarct in the L posterior subinsular white matter on MRI. He and his significant other both have dementia, awaiting guardianship from social services.     Acute CVA-acute infarct in the superior left frontal cortex, punctuate acute infarct in the left posterior subinsular white matter: Presented with AMS and dysarthria. And s/p stroke workup A1c 5.4 LDL 112 target less than 70, CT head MRI brain and CT angio head and neck-no LVO, 60% stenosis right proximal ICA and 50% on the left proximal ICA.  06/21/23  Echo showed EF 20-25%. Continue Eliquis and Lipitor and PT/OT. He is awaiting for skilled nursing facility. Remains neurologically intact, alert and ambulating. Neuro advised to repeat echo in 3 months, and continue Eliquis until EF more than 30-35%. Cardiology recommended cardiac event monitor to assess Afib in setting of stroke, would plan to arrange close to d/c. Would reach out to Cardiology once dispo plans are nearly finalized Remained stable physically and mentally.   Hypertension BP controlled on Coreg/Imdur   Hyperlipidemia: Continue Lipitor   Dysphagia Had MBS on 2/11 showing significantly improved swallow function associated with improved cognition.  Tolerating regular diet.   Dementia Inappropriate behaviors: Psychiatry, initial consult 2/24 agitation, and reevaluated on 3/13 due to concern for hypersexuality and inappropriate touching of nursing staff> recommended fluoxetine, monitoring of patient's behavior.  On Seroquel 25 in a.m. 50 at night, fluoxetine started 10/13. Remains appropriately conversant this AM, continues to ambulate around the unit   HFrEF Euvolemic. Continue Coreg, Imdur. His EF 20- 25% with global hypokinesis. Continue coreg, imdur   BPH Continue Proscar/Flomax    Stage 3a CKD Last labs w/ stable renal function.    Disposition: Skilled nursing facility-memory unit Diet recommendation:  Regular diet DISCHARGE MEDICATION: Allergies as of 08/14/2023       Reactions   Codeine    Shellfish Allergy         Medication List     STOP taking these medications    clopidogrel 75 MG tablet Commonly known as: PLAVIX   methotrexate 50 MG/2ML injection   omeprazole 20  MG capsule Commonly known as: PRILOSEC       TAKE these medications    apixaban 5 MG Tabs tablet Commonly known as: ELIQUIS Take 1 tablet (5 mg total) by mouth 2 (two) times daily.   atorvastatin 40 MG tablet Commonly known as: LIPITOR Take 1 tablet (40 mg total) by mouth  daily.   carvedilol 3.125 MG tablet Commonly known as: COREG Take 1 tablet (3.125 mg total) by mouth 2 (two) times daily with a meal.   docusate sodium 100 MG capsule Commonly known as: COLACE Take 1 capsule (100 mg total) by mouth 2 (two) times daily.   finasteride 5 MG tablet Commonly known as: PROSCAR Take 1 tablet (5 mg total) by mouth daily.   FLUoxetine 10 MG capsule Commonly known as: PROZAC Take 1 capsule (10 mg total) by mouth daily.   folic acid 1 MG tablet Commonly known as: FOLVITE Take 1 tablet (1 mg total) by mouth daily.   hydrALAZINE 25 MG tablet Commonly known as: APRESOLINE Take 1 tablet (25 mg total) by mouth every 8 (eight) hours. What changed:  medication strength how much to take   isosorbide mononitrate 30 MG 24 hr tablet Commonly known as: IMDUR Take 1 tablet (30 mg total) by mouth daily.   pantoprazole 40 MG tablet Commonly known as: PROTONIX Take 1 tablet (40 mg total) by mouth daily.   polyethylene glycol 17 g packet Commonly known as: MIRALAX / GLYCOLAX Take 17 g by mouth daily as needed.   QUEtiapine 50 MG tablet Commonly known as: SEROQUEL Take 1 tablet (50 mg total) by mouth daily at 6 PM.   QUEtiapine 25 MG tablet Commonly known as: SEROQUEL Take 1 tablet (25 mg total) by mouth every morning.   senna-docusate 8.6-50 MG tablet Commonly known as: Senokot-S Take 1 tablet by mouth at bedtime as needed for mild constipation.   tamsulosin 0.4 MG Caps capsule Commonly known as: FLOMAX Take 1 capsule (0.4 mg total) by mouth daily.        Follow-up Information     [facility]. Schedule an appointment as soon as possible for a visit in 1 month(s).   Specialty: Neurology Why: stroke clinic Contact information: [address] [place] [address] [phone]               Discharge Exam: Ceasar Mons Weights   06/21/23 0000  Weight: 60 kg     General:  AAO x 3,   cooperative, no distress;   HEENT:  Normocephalic, PERRL, otherwise with in Normal limits   Neuro:  CNII-XII intact. , normal motor and sensation, reflexes intact   Lungs:   Clear to auscultation BL, Respirations unlabored,  No wheezes / crackles  Cardio:    S1/S2, RRR, No murmure, No Rubs or Gallops   Abdomen:  Soft, non-tender, bowel sounds active all four quadrants, no guarding or peritoneal signs.  Muscular  skeletal:  Limited exam -global generalized weaknesses - in bed, able to move all 4 extremities,   2+ pulses,  symmetric, No pitting edema  Skin:  Dry, warm to touch, negative for any Rashes,  Wounds: Please see nursing documentation         Condition at discharge: good  The results of significant diagnostics from this hospitalization (including imaging, microbiology, ancillary and laboratory) are listed below for reference.   Imaging Studies: No results found.  Microbiology: Results for orders placed or performed during the hospital encounter of 06/21/23  SARS Coronavirus 2 by RT PCR (hospital order, performed in [facility] hospital lab) *cepheid single result test* Anterior Nasal Swab     Status: None   Collection Time: 08/05/23  3:17 PM   Specimen: Anterior Nasal Swab  Result Value Ref Range Status   SARS Coronavirus 2 by RT PCR NEGATIVE NEGATIVE Final    Comment: Performed at [facility], [address] [place], [place] [address]    Labs: CBC: Recent Labs  Lab 08/13/23 0647  WBC 9.2  HGB 11.1*  HCT 35.0*  MCV 86.0  PLT 247   Basic Metabolic Panel: Recent Labs  Lab 08/13/23 0647  NA 141  K 4.6  CL 108  CO2 26  GLUCOSE 91  BUN 24*  CREATININE 1.20  CALCIUM 9.5   Liver Function Tests: Recent Labs  Lab 08/13/23 0647  AST 21  ALT 20  ALKPHOS 22*  BILITOT 0.6  PROT 6.2*  ALBUMIN 2.9*   CBG: No results for input(s): "GLUCAP" in the last 168 hours.  Discharge time spent: greater than 30 minutes.  Signed: Kendell Bane,  MD [facility] 08/14/2023

## 2023-08-18 ENCOUNTER — Emergency Department (HOSPITAL_COMMUNITY)

## 2023-08-18 ENCOUNTER — Emergency Department (HOSPITAL_COMMUNITY)
Admission: EM | Admit: 2023-08-18 | Discharge: 2023-08-18 | Disposition: A | Discharge status: Home / Self Care | Attending: Emergency Medicine | Admitting: Emergency Medicine

## 2023-08-18 DIAGNOSIS — I509 Heart failure, unspecified: Secondary | ICD-10-CM | POA: Insufficient documentation

## 2023-08-18 DIAGNOSIS — I110 Hypertensive heart disease with heart failure: Secondary | ICD-10-CM | POA: Insufficient documentation

## 2023-08-18 DIAGNOSIS — Z87891 Personal history of nicotine dependence: Secondary | ICD-10-CM | POA: Insufficient documentation

## 2023-08-18 DIAGNOSIS — Z8673 Personal history of transient ischemic attack (TIA), and cerebral infarction without residual deficits: Secondary | ICD-10-CM | POA: Diagnosis not present

## 2023-08-18 DIAGNOSIS — R4182 Altered mental status, unspecified: Secondary | ICD-10-CM | POA: Insufficient documentation

## 2023-08-18 DIAGNOSIS — R531 Weakness: Secondary | ICD-10-CM | POA: Insufficient documentation

## 2023-08-18 LAB — CBC
HCT: 35.8 % — ABNORMAL LOW (ref 39.0–52.0)
Hemoglobin: 11.4 g/dL — ABNORMAL LOW (ref 13.0–17.0)
MCH: 27 pg (ref 26.0–34.0)
MCHC: 31.8 g/dL (ref 30.0–36.0)
MCV: 84.8 fL (ref 80.0–100.0)
Platelets: 246 10*3/uL (ref 150–400)
RBC: 4.22 MIL/uL (ref 4.22–5.81)
RDW: 14.4 % (ref 11.5–15.5)
WBC: 10.1 10*3/uL (ref 4.0–10.5)
nRBC: 0 % (ref 0.0–0.2)

## 2023-08-18 LAB — TROPONIN I (HIGH SENSITIVITY)
Troponin I (High Sensitivity): 71 ng/L — ABNORMAL HIGH (ref <18)
Troponin I (High Sensitivity): 57 ng/L — ABNORMAL HIGH (ref <18)

## 2023-08-18 LAB — URINALYSIS, ROUTINE W REFLEX MICROSCOPIC
Bilirubin Urine: NEGATIVE
Glucose, UA: NEGATIVE mg/dL
Hgb urine dipstick: NEGATIVE
Ketones, ur: NEGATIVE mg/dL
Leukocytes,Ua: NEGATIVE
Nitrite: NEGATIVE
Protein, ur: NEGATIVE mg/dL
Specific Gravity, Urine: 1.017 (ref 1.005–1.030)
pH: 5 (ref 5.0–8.0)

## 2023-08-18 LAB — COMPREHENSIVE METABOLIC PANEL WITH GFR
ALT: 17 U/L (ref 0–44)
AST: 21 U/L (ref 15–41)
Albumin: 3.3 g/dL — ABNORMAL LOW (ref 3.5–5.0)
Alkaline Phosphatase: 28 U/L — ABNORMAL LOW (ref 38–126)
Anion gap: 7 (ref 5–15)
BUN: 23 mg/dL (ref 8–23)
CO2: 23 mmol/L (ref 22–32)
Calcium: 9.8 mg/dL (ref 8.9–10.3)
Chloride: 108 mmol/L (ref 98–111)
Creatinine, Ser: 1.19 mg/dL (ref 0.61–1.24)
GFR, Estimated: >60 mL/min (ref >60)
Glucose, Bld: 88 mg/dL (ref 70–99)
Potassium: 3.8 mmol/L (ref 3.5–5.1)
Sodium: 138 mmol/L (ref 135–145)
Total Bilirubin: 0.8 mg/dL (ref 0.0–1.2)
Total Protein: 6.7 g/dL (ref 6.5–8.1)

## 2023-08-18 LAB — LIPASE, BLOOD: Lipase: 47 U/L (ref 11–51)

## 2023-08-18 LAB — PROTIME-INR
INR: 1.3 — ABNORMAL HIGH (ref 0.8–1.2)
Prothrombin Time: 16.1 s — ABNORMAL HIGH (ref 11.4–15.2)

## 2023-08-18 NOTE — ED Triage Notes (Signed)
 Patient brought in by EMS from [facility] due to increase lethargic. EMS state facility did not have a last known well time due to shift change and  3rd shift found patient like this. Patient is normally ambulatory however today he could only stand and pivot. Vital signs stable with EMS.

## 2023-08-18 NOTE — ED Notes (Signed)
 Pt getting out of bed, stating he needs to use the bathroom. Pt ambulated to restroom with assistance, steady gait and no complaints.

## 2023-08-18 NOTE — ED Provider Notes (Signed)
 [facility]-EMERGENCY DEPT [facility] Emergency Department Provider Note MRN:  [id number]  Arrival date & time: 08/18/23     Chief Complaint   Altered mental status History of Present Illness   Dennis [name] is a 78 y.o. year-old male with a history of hypertension, CHF, stroke dementia presenting to the ED with chief complaint of altered mental status.  Reportedly off his baseline, coming from care facility.  Confused and somnolent, not walking like he normally can.  Was complaining of chest pain with staff, was complaining of abdominal pain with EMS, has no complaints currently.  Review of Systems  A thorough review of systems was obtained and all systems are negative except as noted in the HPI and PMH.   Patient's Health History    Past Medical History:  Diagnosis Date   Arthritis    Hypertension     Past Surgical History:  Procedure Laterality Date   CHOLECYSTECTOMY     THROAT SURGERY      No family history on file.  Social History   Socioeconomic History   Marital status: Single    Spouse name: Not on file   Number of children: Not on file   Years of education: Not on file   Highest education level: Not on file  Occupational History   Not on file  Tobacco Use   Smoking status: Former    Types: Cigarettes   Smokeless tobacco: Never  Vaping Use   Vaping status: Never Used  Substance and Sexual Activity   Alcohol use: No   Drug use: No   Sexual activity: Not on file  Other Topics Concern   Not on file  Social History Narrative   Not on file   Social Drivers of Health   Financial Resource Strain: Not on file  Food Insecurity: No Food Insecurity (06/21/2023)   Hunger Vital Sign    Worried About Running Out of Food in the Last Year: Never true    Ran Out of Food in the Last Year: Never true  Transportation Needs: No Transportation Needs (06/21/2023)   PRAPARE - Administrator, Civil Service (Medical): No    Lack of Transportation (Non-Medical): No   Physical Activity: Not on file  Stress: Not on file  Social Connections: Moderately Isolated (06/21/2023)   Social Connection and Isolation Panel [NHANES]    Frequency of Communication with Friends and Family: More than three times a week    Frequency of Social Gatherings with Friends and Family: More than three times a week    Attends Religious Services: More than 4 times per year    Active Member of [employer] or Organizations: No    Attends Banker Meetings: Never    Marital Status: Divorced  Catering manager Violence: Not At Risk (06/21/2023)   Humiliation, Afraid, Rape, and Kick questionnaire    Fear of Current or Ex-Partner: No    Emotionally Abused: No    Physically Abused: No    Sexually Abused: No     Physical Exam   Vitals:   08/18/23 0230 08/18/23 0302  BP: 132/64   Pulse: 75   Resp: 15   Temp:  (!) 97.5 F (36.4 C)  SpO2: 91%     CONSTITUTIONAL: Chronically ill-appearing, NAD NEURO/PSYCH: Somnolent, wakes to voice, hard of hearing, can move all extremities equally, oriented to name only EYES:  eyes equal and reactive ENT/NECK:  no LAD, no JVD CARDIO: Regular rate, well-perfused, normal S1 and S2 PULM:  CTAB no wheezing or rhonchi GI/GU:  non-distended, non-tender MSK/SPINE:  No gross deformities, no edema SKIN:  no rash, atraumatic   *Additional and/or pertinent findings included in MDM below  Diagnostic and Interventional Summary    EKG Interpretation Date/Time:  Monday August 18 2023 00:38:40 EDT Ventricular Rate:  71 PR Interval:  180 QRS Duration:  92 QT Interval:  400 QTC Calculation: 435 R Axis:   16  Text Interpretation: Sinus rhythm Abnormal T, consider ischemia, lateral leads ST elevation, consider anterior injury Confirmed by Kennis Carina [phone]) on 08/18/2023 1:56:14 AM       Labs Reviewed  CBC - Abnormal; Notable for the following components:      Result Value   Hemoglobin 11.4 (*)    HCT 35.8 (*)    All other components  within normal limits  COMPREHENSIVE METABOLIC PANEL WITH GFR - Abnormal; Notable for the following components:   Albumin 3.3 (*)    Alkaline Phosphatase 28 (*)    All other components within normal limits  PROTIME-INR - Abnormal; Notable for the following components:   Prothrombin Time 16.1 (*)    INR 1.3 (*)    All other components within normal limits  TROPONIN I (HIGH SENSITIVITY) - Abnormal; Notable for the following components:   Troponin I (High Sensitivity) 71 (*)    All other components within normal limits  TROPONIN I (HIGH SENSITIVITY) - Abnormal; Notable for the following components:   Troponin I (High Sensitivity) 57 (*)    All other components within normal limits  URINALYSIS, ROUTINE W REFLEX MICROSCOPIC  LIPASE, BLOOD    CT HEAD WO CONTRAST ( )  Final Result    DG Chest Port 1 View  Final Result      Medications - No data to display   Procedures  /  Critical Care Procedures  ED Course and Medical Decision Making  Initial Impression and Ddx Altered mental status, confusion, somnolence and this 78 year old male with history of dementia, stroke, CHF.  Differential diagnosis includes electrolyte disturbance, UTI, polypharmacy, dementia, CNS lesion or mass or bleeding.  Patient is anticoagulated.  Past medical/surgical history that increases complexity of ED encounter: Dementia, CHF, stroke  Interpretation of Diagnostics I personally reviewed the EKG and my interpretation is as follows: Sinus rhythm with nonspecific findings, similar morphology to prior without significant changes  No significant blood count or electrolyte disturbance.  Minimally elevated troponin that is downtrending upon repeat.  Patient Reassessment and Ultimate Disposition/Management     Patient has been very stable during his time here in the emergency department.  He seems much more awake, no longer exhibiting severe weakness.  He got himself out of bed and walked to the bathroom on his  own to provide urine sample.  Never had a fever, vitals have been normal the whole time.  Given this stable improvement and reassuring workup there is no indication for further testing or admission, patient is appropriate for discharge.  Attempted to reach out to the emergency contact, no answer.  Patient management required discussion with the following services or consulting groups:  None  Complexity of Problems Addressed Acute illness or injury that poses threat of life of bodily function  Additional Data Reviewed and Analyzed Further history obtained from: EMS on arrival  Additional Factors Impacting ED Encounter Risk Consideration of hospitalization  Elmer Sow. Pilar Plate, MD [facility] Emergency Medicine [facility] [email or web address]  Final Clinical Impressions(s) / ED Diagnoses     ICD-10-CM  1. Weakness  R53.1       ED Discharge Orders     None        Discharge Instructions Discussed with and Provided to Patient:     Discharge Instructions      You were evaluated in the Emergency Department and after careful evaluation, we did not find any emergent condition requiring admission or further testing in the hospital.  Your exam/testing today was overall reassuring.  Recommend close follow-up with your primary care doctor to discuss your symptoms.  Please return to the Emergency Department if you experience any worsening of your condition.  Thank you for allowing [place] to be a part of your care.        Sabas Sous, MD 08/18/23 [phone]

## 2023-08-18 NOTE — ED Notes (Signed)
 Patient up walking to bathroom, gait steady. Denies any pain at this time.

## 2023-08-18 NOTE — Discharge Instructions (Addendum)
You were evaluated in the Emergency Department and after careful evaluation, we did not find any emergent condition requiring admission or further testing in the hospital. ° °Your exam/testing today was overall reassuring.  Recommend close follow-up with your primary care doctor to discuss your symptoms. ° °Please return to the Emergency Department if you experience any worsening of your condition.  Thank you for allowing us to be a part of your care. ° °

## 2023-09-16 ENCOUNTER — Encounter (HOSPITAL_COMMUNITY): Payer: Self-pay

## 2023-09-16 ENCOUNTER — Observation Stay (HOSPITAL_COMMUNITY)
Admission: EM | Admit: 2023-09-16 | Discharge: 2023-09-19 | Disposition: A | Discharge status: Home / Self Care | Attending: Family Medicine | Admitting: Family Medicine

## 2023-09-16 ENCOUNTER — Other Ambulatory Visit: Payer: Self-pay

## 2023-09-16 ENCOUNTER — Emergency Department (HOSPITAL_COMMUNITY)

## 2023-09-16 DIAGNOSIS — Z7901 Long term (current) use of anticoagulants: Secondary | ICD-10-CM | POA: Insufficient documentation

## 2023-09-16 DIAGNOSIS — Z8673 Personal history of transient ischemic attack (TIA), and cerebral infarction without residual deficits: Secondary | ICD-10-CM

## 2023-09-16 DIAGNOSIS — E785 Hyperlipidemia, unspecified: Secondary | ICD-10-CM | POA: Diagnosis present

## 2023-09-16 DIAGNOSIS — I5020 Unspecified systolic (congestive) heart failure: Secondary | ICD-10-CM | POA: Diagnosis not present

## 2023-09-16 DIAGNOSIS — R55 Syncope and collapse: Secondary | ICD-10-CM | POA: Diagnosis not present

## 2023-09-16 DIAGNOSIS — Z87891 Personal history of nicotine dependence: Secondary | ICD-10-CM | POA: Insufficient documentation

## 2023-09-16 DIAGNOSIS — I10 Essential (primary) hypertension: Secondary | ICD-10-CM | POA: Diagnosis present

## 2023-09-16 DIAGNOSIS — N390 Urinary tract infection, site not specified: Secondary | ICD-10-CM | POA: Diagnosis not present

## 2023-09-16 DIAGNOSIS — Z79899 Other long term (current) drug therapy: Secondary | ICD-10-CM | POA: Diagnosis not present

## 2023-09-16 DIAGNOSIS — I110 Hypertensive heart disease with heart failure: Secondary | ICD-10-CM | POA: Diagnosis not present

## 2023-09-16 DIAGNOSIS — J449 Chronic obstructive pulmonary disease, unspecified: Secondary | ICD-10-CM | POA: Insufficient documentation

## 2023-09-16 DIAGNOSIS — G9341 Metabolic encephalopathy: Secondary | ICD-10-CM

## 2023-09-16 DIAGNOSIS — F39 Unspecified mood [affective] disorder: Secondary | ICD-10-CM | POA: Insufficient documentation

## 2023-09-16 DIAGNOSIS — N179 Acute kidney failure, unspecified: Secondary | ICD-10-CM

## 2023-09-16 DIAGNOSIS — D509 Iron deficiency anemia, unspecified: Secondary | ICD-10-CM | POA: Diagnosis not present

## 2023-09-16 DIAGNOSIS — N3000 Acute cystitis without hematuria: Secondary | ICD-10-CM

## 2023-09-16 DIAGNOSIS — N400 Enlarged prostate without lower urinary tract symptoms: Secondary | ICD-10-CM | POA: Insufficient documentation

## 2023-09-16 DIAGNOSIS — R4182 Altered mental status, unspecified: Secondary | ICD-10-CM

## 2023-09-16 DIAGNOSIS — M069 Rheumatoid arthritis, unspecified: Secondary | ICD-10-CM | POA: Diagnosis not present

## 2023-09-16 LAB — COMPREHENSIVE METABOLIC PANEL WITH GFR
ALT: 12 U/L (ref 0–44)
AST: 28 U/L (ref 15–41)
Albumin: 2.9 g/dL — ABNORMAL LOW (ref 3.5–5.0)
Alkaline Phosphatase: 24 U/L — ABNORMAL LOW (ref 38–126)
Anion gap: 5 (ref 5–15)
BUN: 25 mg/dL — ABNORMAL HIGH (ref 8–23)
CO2: 23 mmol/L (ref 22–32)
Calcium: 8.4 mg/dL — ABNORMAL LOW (ref 8.9–10.3)
Chloride: 110 mmol/L (ref 98–111)
Creatinine, Ser: 1.53 mg/dL — ABNORMAL HIGH (ref 0.61–1.24)
GFR, Estimated: 46 mL/min — ABNORMAL LOW (ref >60)
Glucose, Bld: 105 mg/dL — ABNORMAL HIGH (ref 70–99)
Potassium: 4.7 mmol/L (ref 3.5–5.1)
Sodium: 138 mmol/L (ref 135–145)
Total Bilirubin: 1.1 mg/dL (ref 0.0–1.2)
Total Protein: 6 g/dL — ABNORMAL LOW (ref 6.5–8.1)

## 2023-09-16 LAB — CBC WITH DIFFERENTIAL/PLATELET
Abs Immature Granulocytes: 0.07 10*3/uL (ref 0.00–0.07)
Basophils Absolute: 0 10*3/uL (ref 0.0–0.1)
Basophils Relative: 0 %
Eosinophils Absolute: 0 10*3/uL (ref 0.0–0.5)
Eosinophils Relative: 0 %
HCT: 34.2 % — ABNORMAL LOW (ref 39.0–52.0)
Hemoglobin: 10.6 g/dL — ABNORMAL LOW (ref 13.0–17.0)
Immature Granulocytes: 1 %
Lymphocytes Relative: 14 %
Lymphs Abs: 1.3 10*3/uL (ref 0.7–4.0)
MCH: 27.1 pg (ref 26.0–34.0)
MCHC: 31 g/dL (ref 30.0–36.0)
MCV: 87.5 fL (ref 80.0–100.0)
Monocytes Absolute: 0.8 10*3/uL (ref 0.1–1.0)
Monocytes Relative: 9 %
Neutro Abs: 7 10*3/uL (ref 1.7–7.7)
Neutrophils Relative %: 76 %
Platelets: 202 10*3/uL (ref 150–400)
RBC: 3.91 MIL/uL — ABNORMAL LOW (ref 4.22–5.81)
RDW: 15.4 % (ref 11.5–15.5)
WBC: 9.2 10*3/uL (ref 4.0–10.5)
nRBC: 0 % (ref 0.0–0.2)

## 2023-09-16 LAB — URINALYSIS, W/ REFLEX TO CULTURE (INFECTION SUSPECTED)
Bilirubin Urine: NEGATIVE
Glucose, UA: NEGATIVE mg/dL
Hgb urine dipstick: NEGATIVE
Ketones, ur: NEGATIVE mg/dL
Nitrite: NEGATIVE
Protein, ur: 100 mg/dL — AB
Specific Gravity, Urine: 1.016 (ref 1.005–1.030)
pH: 5 (ref 5.0–8.0)

## 2023-09-16 LAB — TSH: TSH: 1.741 u[IU]/mL (ref 0.350–4.500)

## 2023-09-16 LAB — CBG MONITORING, ED: Glucose-Capillary: 91 mg/dL (ref 70–99)

## 2023-09-16 LAB — LIPASE, BLOOD: Lipase: 38 U/L (ref 11–51)

## 2023-09-16 LAB — TROPONIN I (HIGH SENSITIVITY)
Troponin I (High Sensitivity): 30 ng/L — ABNORMAL HIGH (ref <18)
Troponin I (High Sensitivity): 30 ng/L — ABNORMAL HIGH (ref <18)

## 2023-09-16 LAB — MRSA NEXT GEN BY PCR, NASAL: MRSA by PCR Next Gen: NOT DETECTED

## 2023-09-16 LAB — LACTATE DEHYDROGENASE: LDH: 260 U/L — ABNORMAL HIGH (ref 98–192)

## 2023-09-16 LAB — VITAMIN B12: Vitamin B-12: 346 pg/mL (ref 180–914)

## 2023-09-16 LAB — I-STAT CG4 LACTIC ACID, ED: Lactic Acid, Venous: 0.9 mmol/L (ref 0.5–1.9)

## 2023-09-16 LAB — AMMONIA: Ammonia: <13 umol/L (ref 9–35)

## 2023-09-16 MED ORDER — ACETAMINOPHEN 650 MG RE SUPP: 650.0000 mg | Freq: Four times a day (QID) | RECTAL | Status: DC | PRN

## 2023-09-16 MED ORDER — SODIUM CHLORIDE 0.9 % IV SOLN: INTRAVENOUS | Status: AC

## 2023-09-16 MED ORDER — SODIUM CHLORIDE 0.9 % IV SOLN
1.0000 g | INTRAVENOUS | Status: DC
  Administered 2023-09-17 – 2023-09-18 (×2): 1 g via INTRAVENOUS
  Filled 2023-09-16 (×2): qty 10

## 2023-09-16 MED ORDER — ACETAMINOPHEN 325 MG PO TABS
650.0000 mg | ORAL_TABLET | Freq: Four times a day (QID) | ORAL | Status: DC | PRN
  Administered 2023-09-17: 650 mg via ORAL
  Filled 2023-09-16: qty 2

## 2023-09-16 MED ORDER — LACTATED RINGERS IV BOLUS
1000.0000 mL | Freq: Once | INTRAVENOUS | Status: AC
  Administered 2023-09-16: 1000 mL via INTRAVENOUS

## 2023-09-16 MED ORDER — SODIUM CHLORIDE 0.9 % IV SOLN
1.0000 g | Freq: Once | INTRAVENOUS | Status: AC
  Administered 2023-09-16: 1 g via INTRAVENOUS
  Filled 2023-09-16: qty 10

## 2023-09-16 NOTE — ED Provider Notes (Signed)
 Care assumed from previous provider.  See note for full HPI.  In summation here from nursing facility.  Sounds like sitting in a chair and had a syncopal episode.  Was unresponsive.  Apparently with EMS he was hypotensive which improved after IV fluids.  Plan on follow-up on workup. More confused than baseline.  Recent echo February EF 20% Physical Exam  BP 127/63   Pulse (!) 58   Temp 98 F (36.7 C) (Oral)   Resp 15   Ht 5\' 4"  (1.626 m)   Wt 63.9 kg   SpO2 92%   BMI 24.18 kg/m   Physical Exam Vitals and nursing note reviewed.  Constitutional:      General: He is not in acute distress.    Appearance: He is well-developed. He is not ill-appearing or diaphoretic.  HENT:     Head: Atraumatic.  Eyes:     Pupils: Pupils are equal, round, and reactive to light.  Cardiovascular:     Rate and Rhythm: Normal rate and regular rhythm.  Pulmonary:     Effort: Pulmonary effort is normal. No respiratory distress.  Abdominal:     General: There is no distension.     Palpations: Abdomen is soft.  Musculoskeletal:        General: Normal range of motion.     Cervical back: Normal range of motion and neck supple.  Skin:    General: Skin is warm and dry.  Neurological:     General: No focal deficit present.     Mental Status: He is alert and oriented to person, place, and time.     Procedures  Procedures Labs Reviewed  COMPREHENSIVE METABOLIC PANEL WITH GFR - Abnormal; Notable for the following components:      Result Value   Glucose, Bld 105 (*)    BUN 25 (*)    Creatinine, Ser 1.53 (*)    Calcium  8.4 (*)    Total Protein 6.0 (*)    Albumin 2.9 (*)    Alkaline Phosphatase 24 (*)    GFR, Estimated 46 (*)    All other components within normal limits  CBC WITH DIFFERENTIAL/PLATELET - Abnormal; Notable for the following components:   RBC 3.91 (*)    Hemoglobin 10.6 (*)    HCT 34.2 (*)    All other components within normal limits  LACTATE DEHYDROGENASE - Abnormal; Notable for  the following components:   LDH 260 (*)    All other components within normal limits  URINALYSIS, W/ REFLEX TO CULTURE (INFECTION SUSPECTED) - Abnormal; Notable for the following components:   APPearance HAZY (*)    Protein, ur 100 (*)    Leukocytes,Ua MODERATE (*)    Bacteria, UA RARE (*)    All other components within normal limits  TROPONIN I (HIGH SENSITIVITY) - Abnormal; Notable for the following components:   Troponin I (High Sensitivity) 30 (*)    All other components within normal limits  URINE CULTURE  LIPASE, BLOOD  CBG MONITORING, ED  I-STAT CG4 LACTIC ACID, ED  TROPONIN I (HIGH SENSITIVITY)   CT HEAD WO CONTRAST ( ) Result Date: 09/16/2023 CLINICAL DATA:  Delirium EXAM: CT HEAD WITHOUT CONTRAST TECHNIQUE: Contiguous axial images were obtained from the base of the skull through the vertex without intravenous contrast. RADIATION DOSE REDUCTION: This exam was performed according to the departmental dose-optimization program which includes automated exposure control, adjustment of the mA and/or kV according to patient size and/or use of iterative reconstruction technique. COMPARISON:  CT  head 08/18/2023 FINDINGS: Brain: Cerebral ventricle sizes are concordant with the degree of cerebral volume loss. Patchy and confluent areas of decreased attenuation are noted throughout the deep and periventricular white matter of the cerebral hemispheres bilaterally, compatible with chronic microvascular ischemic disease. No evidence of large-territorial acute infarction. No parenchymal hemorrhage. No mass lesion. No extra-axial collection. No mass effect or midline shift. No hydrocephalus. Basilar cisterns are patent. Vascular: No hyperdense vessel. Atherosclerotic calcifications are present within the cavernous internal carotid and vertebral arteries. Skull: No acute fracture or focal lesion. Sinuses/Orbits: Paranasal sinuses and mastoid air cells are clear. The orbits are unremarkable. Other: None.  IMPRESSION: No acute intracranial abnormality. Electronically Signed   By: Morgane  Naveau M.D.   On: 09/16/2023 17:06   DG Chest 2 View Result Date: 09/16/2023 CLINICAL DATA:  Altered mental status. EXAM: CHEST - 2 VIEW COMPARISON:  August 18, 2023. FINDINGS: Stable cardiomediastinal silhouette. Both lungs are clear. The visualized skeletal structures are unremarkable. IMPRESSION: No active cardiopulmonary disease. Electronically Signed   By: Rosalene Colon M.D.   On: 09/16/2023 17:06   CT HEAD WO CONTRAST ( ) Result Date: 08/18/2023 CLINICAL DATA:  Altered mental status EXAM: CT HEAD WITHOUT CONTRAST TECHNIQUE: Contiguous axial images were obtained from the base of the skull through the vertex without intravenous contrast. RADIATION DOSE REDUCTION: This exam was performed according to the departmental dose-optimization program which includes automated exposure control, adjustment of the mA and/or kV according to patient size and/or use of iterative reconstruction technique. COMPARISON:  06/22/2023 FINDINGS: Brain: There is atrophy and chronic small vessel disease changes. No acute intracranial abnormality. Specifically, no hemorrhage, hydrocephalus, mass lesion, acute infarction, or significant intracranial injury. Vascular: No hyperdense vessel or unexpected calcification. Skull: No acute calvarial abnormality. Sinuses/Orbits: No acute findings Other: None IMPRESSION: Atrophy, chronic microvascular disease. No acute intracranial abnormality. Electronically Signed   By: Janeece Mechanic M.D.   On: 08/18/2023 01:14   DG Chest Port 1 View Result Date: 08/18/2023 CLINICAL DATA:  Altered mental status, chest pain EXAM: PORTABLE CHEST 1 VIEW COMPARISON:  06/22/2023 FINDINGS: Heart and mediastinal contours are within normal limits. No focal opacities or effusions. No acute bony abnormality. Aortic atherosclerosis IMPRESSION: No active disease. Electronically Signed   By: Janeece Mechanic M.D.   On: 08/18/2023 01:09     ED Course / MDM   Clinical Course as of 09/16/23 2034  Tue Sep 16, 2023  1958 Dr. Michell Ahumada with medicine will evaluate for admission [BH]    Clinical Course User Index [BH] Jodilyn Giese A, PA-C   Care assumed from previous provider.  See note for full HPI.  In summation here from nursing facility.  Sounds like sitting in a chair and had a syncopal episode.  Was unresponsive.  Apparently with EMS he was hypotensive which improved after IV fluids.  Plan on follow-up on workup.   Reviewed recent echocardiogram in February, EF of 20%.  Patient has history of dementia.  Given possible syncope will admit for further workup and management given high risk for arrhythmias with decreased ejection fraction. A little more confused than baseline per facility.  Labs and imaging personally viewed and interpreted:  UA moderate leuks, bacteria, 250 WBC Troponin 30 however similar to prior Lactic within normal limits Metabolic panel creatinine 1.53 up from 1.19 CBC without leukocytosis Imaging without significant abnormality  Patient reassessed.  Pleasant in room.  States she does not know what happened today.  Will admit for syncope as well as possible  UTI worsening of his baseline mental status.  Discussed with Dr. Michell Ahumada with medicine who is agreeable to evaluate patient for admission   Medical Decision Making Amount and/or Complexity of Data Reviewed Independent Historian: EMS External Data Reviewed: labs, radiology, ECG and notes. Labs: ordered. Decision-making details documented in ED Course. Radiology: ordered and independent interpretation performed. Decision-making details documented in ED Course. ECG/medicine tests: ordered and independent interpretation performed. Decision-making details documented in ED Course.  Risk OTC drugs. Prescription drug management. Parenteral controlled substances. Decision regarding hospitalization. Diagnosis or treatment significantly limited  by social determinants of health.      Olsen Mccutchan A, PA-C 09/16/23 2035    Iva Mariner, MD 09/17/23 [phone]

## 2023-09-16 NOTE — ED Triage Notes (Signed)
 Brought in by [facility] from [facility] for altered mental status, "not acting right". Patient appears awake, alert but hard of hearing. Peripheral line on left hand g20 with 200 ml of NS infused by EMS for BP=84/50 mmhg initially. BP rechecked=106/52 mmhg, glucose=115 mg/dl/

## 2023-09-16 NOTE — H&P (Signed)
 History and Physical    Dennis [name]:[id number] DOB: 10/22/1945 DOA: 09/16/2023  PCP: Vicente Graham, No  Chief Complaint: AMS/syncope  HPI: Dennis [name] is a 78 y.o. male with medical history significant of dementia, hypertension, hyperlipidemia, CVA, HFrEF, BPH, mood disorder, RA, COPD, OSA not on CPAP.  Patient was admitted to the hospital 2/1-3/27/2025 for acute CVA.  Patient presents to the ED today via EMS from his nursing facility for evaluation of syncope/confusion/altered mental status and hypotension.  Nursing facility reported that patient was sitting in a chair and had a syncopal episode/became unresponsive.  They felt that the patient was little more confused than baseline.  Blood pressure 84/50 with EMS and was given 200 mL normal saline and blood pressure 106/52 when rechecked.  Glucose 115 with EMS.  In the ED, patient noted to be slightly bradycardic with heart rate in the 50-60s.  Blood pressure improved.  Not hypoxic.  Labs showing no leukocytosis, hemoglobin 10.6 (baseline 11-12), MCV 87.5, glucose 105, BUN 25, creatinine 1.5 (baseline 1.2), initial troponin 30 and repeat pending, lactate normal.  UA with moderate leukocytes and microscopy showing 21-50 WBCs and rare bacteria.  Urine culture in process.  CT head negative for acute intracranial abnormality.  Chest x-ray showing no active cardiopulmonary disease.  EKG showing sinus rhythm and T wave inversions in lateral leads (not new).  No acute ischemic changes. Patient was given ceftriaxone  and 1 L LR.  TRH called to admit.  Patient is currently awake and alert, oriented to person and place.  States he was walking and then blacked out.  He is very upset about the care he receives at his facility.  Denies chest pain, shortness of breath, vomiting, diarrhea, abdominal pain, or any urinary symptoms.  Continues to express his frustration regarding his nursing facility and not able to give any additional history.  Review of Systems:  Review  of Systems  All other systems reviewed and are negative.   Past Medical History:  Diagnosis Date   Arthritis    Hypertension     Past Surgical History:  Procedure Laterality Date   CHOLECYSTECTOMY     THROAT SURGERY       reports that he has quit smoking. His smoking use included cigarettes. He has never used smokeless tobacco. He reports that he does not drink alcohol  and does not use drugs.  Allergies  Allergen Reactions   Codeine    Shellfish Allergy     History reviewed. No pertinent family history.  Prior to Admission medications   Medication Sig Start Date End Date Taking? Authorizing Provider  apixaban  (ELIQUIS ) 5 MG TABS tablet Take 1 tablet (5 mg total) by mouth 2 (two) times daily. 08/05/23   Aisha Hove, MD  atorvastatin  (LIPITOR ) 40 MG tablet Take 1 tablet (40 mg total) by mouth daily. 03/16/23   Ghimire, Estil Heman, MD  carvedilol  (COREG ) 3.125 MG tablet Take 1 tablet (3.125 mg total) by mouth 2 (two) times daily with a meal. 03/16/23   Ghimire, Estil Heman, MD  docusate sodium  (COLACE) 100 MG capsule Take 1 capsule (100 mg total) by mouth 2 (two) times daily. 08/05/23   Aisha Hove, MD  finasteride  (PROSCAR ) 5 MG tablet Take 1 tablet (5 mg total) by mouth daily. 03/16/23   Ghimire, Estil Heman, MD  FLUoxetine  (PROZAC ) 10 MG capsule Take 1 capsule (10 mg total) by mouth daily. 08/06/23   Aisha Hove, MD  folic acid  (FOLVITE ) 1 MG tablet Take 1 tablet (1  mg total) by mouth daily. 03/16/23   Ghimire, Estil Heman, MD  hydrALAZINE  (APRESOLINE ) 25 MG tablet Take 1 tablet (25 mg total) by mouth every 8 (eight) hours. 08/13/23   ShahmehdiConstantino Demark, MD  isosorbide  mononitrate (IMDUR ) 30 MG 24 hr tablet Take 1 tablet (30 mg total) by mouth daily. 03/16/23   Ghimire, Estil Heman, MD  pantoprazole  (PROTONIX ) 40 MG tablet Take 1 tablet (40 mg total) by mouth daily. 08/06/23   Aisha Hove, MD  polyethylene glycol (MIRALAX  / GLYCOLAX ) 17 g packet Take 17  g by mouth daily as needed. 08/05/23   Aisha Hove, MD  QUEtiapine  (SEROQUEL ) 25 MG tablet Take 1 tablet (25 mg total) by mouth every morning. 08/06/23   Aisha Hove, MD  QUEtiapine  (SEROQUEL ) 50 MG tablet Take 1 tablet (50 mg total) by mouth daily at 6 PM. 08/05/23   Aisha Hove, MD  senna-docusate (SENOKOT-S) 8.6-50 MG tablet Take 1 tablet by mouth at bedtime as needed for mild constipation. 08/05/23   Aisha Hove, MD  tamsulosin  (FLOMAX ) 0.4 MG CAPS capsule Take 1 capsule (0.4 mg total) by mouth daily. 03/16/23   Burton Casey, MD    Physical Exam: Vitals:   09/16/23 1800 09/16/23 1900 09/16/23 1915 09/16/23 1930  BP: 117/61 (!) 128/59  135/62  Pulse: (!) 56 63 (!) 56 60  Resp: 13 13 13 14   Temp:    98 F (36.7 C)  TempSrc:    Oral  SpO2: 92% 94% 94% 95%  Weight:      Height:        Physical Exam Vitals reviewed.  Constitutional:      General: He is not in acute distress. HENT:     Head: Normocephalic and atraumatic.     Mouth/Throat:     Mouth: Mucous membranes are dry.  Eyes:     Extraocular Movements: Extraocular movements intact.  Cardiovascular:     Rate and Rhythm: Normal rate and regular rhythm.     Pulses: Normal pulses.  Pulmonary:     Effort: Pulmonary effort is normal. No respiratory distress.     Breath sounds: Normal breath sounds. No wheezing or rales.  Abdominal:     General: Bowel sounds are normal. There is no distension.     Palpations: Abdomen is soft.     Tenderness: There is no abdominal tenderness. There is no guarding.  Musculoskeletal:     Cervical back: Normal range of motion.     Right lower leg: No edema.     Left lower leg: No edema.  Skin:    General: Skin is warm and dry.  Neurological:     General: No focal deficit present.     Mental Status: He is alert.     Labs on Admission: I have personally reviewed following labs and imaging studies  CBC: Recent Labs  Lab 09/16/23 1353  WBC 9.2   NEUTROABS 7.0  HGB 10.6*  HCT 34.2*  MCV 87.5  PLT 202   Basic Metabolic Panel: Recent Labs  Lab 09/16/23 1353  NA 138  K 4.7  CL 110  CO2 23  GLUCOSE 105*  BUN 25*  CREATININE 1.53*  CALCIUM  8.4*   GFR: Estimated Creatinine Clearance: 32.8 mL/min (A) (by C-G formula based on SCr of 1.53 mg/dL (H)). Liver Function Tests: Recent Labs  Lab 09/16/23 1353  AST 28  ALT 12  ALKPHOS 24*  BILITOT 1.1  PROT 6.0*  ALBUMIN 2.9*   Recent Labs  Lab 09/16/23 1621  LIPASE 38   No results for input(s): "AMMONIA" in the last 168 hours. Coagulation Profile: No results for input(s): "INR", "PROTIME" in the last 168 hours. Cardiac Enzymes: No results for input(s): "CKTOTAL", "CKMB", "CKMBINDEX", "TROPONINI" in the last 168 hours. BNP (last 3 results) No results for input(s): "PROBNP" in the last 8760 hours. HbA1C: No results for input(s): "HGBA1C" in the last 72 hours. CBG: Recent Labs  Lab 09/16/23 1935  GLUCAP 91   Lipid Profile: No results for input(s): "CHOL", "HDL", "LDLCALC", "TRIG", "CHOLHDL", "LDLDIRECT" in the last 72 hours. Thyroid Function Tests: No results for input(s): "TSH", "T4TOTAL", "FREET4", "T3FREE", "THYROIDAB" in the last 72 hours. Anemia Panel: No results for input(s): "VITAMINB12", "FOLATE", "FERRITIN", "TIBC", "IRON", "RETICCTPCT" in the last 72 hours. Urine analysis:    Component Value Date/Time   COLORURINE YELLOW 09/16/2023 1621   APPEARANCEUR HAZY (A) 09/16/2023 1621   LABSPEC 1.016 09/16/2023 1621   PHURINE 5.0 09/16/2023 1621   GLUCOSEU NEGATIVE 09/16/2023 1621   HGBUR NEGATIVE 09/16/2023 1621   BILIRUBINUR NEGATIVE 09/16/2023 1621   KETONESUR NEGATIVE 09/16/2023 1621   PROTEINUR 100 (A) 09/16/2023 1621   NITRITE NEGATIVE 09/16/2023 1621   LEUKOCYTESUR MODERATE (A) 09/16/2023 1621    Radiological Exams on Admission: CT HEAD WO CONTRAST ( ) Result Date: 09/16/2023 CLINICAL DATA:  Delirium EXAM: CT HEAD WITHOUT CONTRAST  TECHNIQUE: Contiguous axial images were obtained from the base of the skull through the vertex without intravenous contrast. RADIATION DOSE REDUCTION: This exam was performed according to the departmental dose-optimization program which includes automated exposure control, adjustment of the mA and/or kV according to patient size and/or use of iterative reconstruction technique. COMPARISON:  CT head 08/18/2023 FINDINGS: Brain: Cerebral ventricle sizes are concordant with the degree of cerebral volume loss. Patchy and confluent areas of decreased attenuation are noted throughout the deep and periventricular white matter of the cerebral hemispheres bilaterally, compatible with chronic microvascular ischemic disease. No evidence of large-territorial acute infarction. No parenchymal hemorrhage. No mass lesion. No extra-axial collection. No mass effect or midline shift. No hydrocephalus. Basilar cisterns are patent. Vascular: No hyperdense vessel. Atherosclerotic calcifications are present within the cavernous internal carotid and vertebral arteries. Skull: No acute fracture or focal lesion. Sinuses/Orbits: Paranasal sinuses and mastoid air cells are clear. The orbits are unremarkable. Other: None. IMPRESSION: No acute intracranial abnormality. Electronically Signed   By: Morgane  Naveau M.D.   On: 09/16/2023 17:06   DG Chest 2 View Result Date: 09/16/2023 CLINICAL DATA:  Altered mental status. EXAM: CHEST - 2 VIEW COMPARISON:  August 18, 2023. FINDINGS: Stable cardiomediastinal silhouette. Both lungs are clear. The visualized skeletal structures are unremarkable. IMPRESSION: No active cardiopulmonary disease. Electronically Signed   By: Rosalene Colon M.D.   On: 09/16/2023 17:06    Assessment and Plan  Syncope Patient hypotensive with EMS with SBP in the 80s.  Blood pressure has now improved after IV fluids.  Chest x-ray showing no active cardiopulmonary disease.  Patient is on a beta-blocker and mildly  bradycardic with heart rate 50-60s (sinus rhythm).  Troponin 30> 30, not consistent with ACS.  Patient is not endorsing chest pain and EKG without acute ischemic changes. PE less likely as he is anticoagulated with Eliquis  and not tachycardic or hypoxic.  CT head negative for acute intracranial abnormality and no focal neurodeficit on exam.  No seizure-like activity reported.  Echo done 06/21/2023 was showing LVEF worsened from 45-50% to 20-25%, global hypokinesis, grade 1 diastolic dysfunction, mild  mitral regurgitation, and mild eccentric aortic insufficiency but severely likely underestimated. -Cardiac monitoring -Patient appears dehydrated on exam.  Continue gentle IV fluid hydration and monitor volume status very closely. -Hold home antihypertensives/beta-blocker at this time -Check orthostatics -Fall precautions  AKI Likely prerenal in etiology from dehydration.  BUN 25, creatinine 1.5 (baseline 1.2).  Continue gentle IV fluid hydration and monitor renal function.  Avoid nephrotoxic agents.  Possible UTI UA with evidence of pyuria and bacteriuria.  Hypotension likely from dehydration, now improved after IV fluids.  No fever, tachycardia, leukocytosis, or lactic acidosis to suggest sepsis.  Continue ceftriaxone  and follow-up urine culture.  Acute metabolic encephalopathy in the setting of baseline dementia UTI and dehydration likely contributing. CT head negative for acute intracranial abnormality and no focal neurodeficit on exam.  No seizure-like activity reported.  Check TSH, B12, and ammonia level.  Hypertension Hold antihypertensives at this time and check orthostatics.  HFrEF Patient appears dehydrated on exam and has an AKI.  Continue gentle IV fluid hydration and monitor volume status very closely.  Chronic normocytic anemia Hemoglobin close to baseline and no signs of bleeding.  Monitor CBC.  Hyperlipidemia History of CVA BPH Mood disorder RA COPD: Stable, no signs of acute  exacerbation. Unable to safely order home medications at this time as pharmacy medication reconciliation is pending.  DVT prophylaxis: Continue Eliquis  after pharmacy med rec is done. Code Status: Full Code (discussed with the patient) Family Communication: No family available at this time. Level of care: Telemetry bed Admission status: It is my clinical opinion that referral for OBSERVATION is reasonable and necessary in this patient based on the above information provided. The aforementioned taken together are felt to place the patient at high risk for further clinical deterioration. However, it is anticipated that the patient may be medically stable for discharge from the hospital within 24 to 48 hours.  Juliette Oh MD [facility]  If 7PM-7AM, please contact night-coverage [email or web address]  09/16/2023, 7:58 PM

## 2023-09-16 NOTE — ED Provider Notes (Signed)
 [place] EMERGENCY DEPARTMENT AT [facility] Provider Note   CSN: [id number] Arrival date & time: 09/16/23  1327     History Chief Complaint  Patient presents with   Altered Mental Status     Altered Mental Status Associated symptoms: weakness   Associated symptoms: no abdominal pain and no seizures    Dennis [name] is a 78 y.o. male presenting for his describes altered level of consciousness coming from a nursing facility.  EMS brought him and noted that he was hypotensive on their assessment and a initiated IV fluid bolus with him receiving 200 mL of normal saline prior to arriving.  They also checked blood glucose which at their reading was 115.  Talking with the patient he does state that he has had loose stools but is unable to enumerate how many and for how long.  He denies any pain at present but does present with delayed skin turgor and dry oral mucosa.  He has a previous medical history of dementia, hyperlipidemia, hypertension, aortic insufficiency, and previous CVA.  Records show that he is currently on Eliquis , Lipitor , Coreg , finasteride , fluoxetine , Apresoline  isosorbide , Seroquel .   Patient's recorded medical, surgical, social, medication list and allergies were reviewed in the Snapshot window as part of the initial history.   Review of Systems   Review of Systems  Gastrointestinal:  Positive for diarrhea. Negative for abdominal distention and abdominal pain.  Neurological:  Positive for weakness. Negative for dizziness, seizures and syncope.    Physical Exam Updated Vital Signs BP (!) 114/53 (BP Location: Right Arm)   Pulse 64   Temp 97.9 F (36.6 C) (Oral)   Resp 20   Ht 5\' 4"  (1.626 m)   Wt 63.9 kg   SpO2 95%   BMI 24.18 kg/m  Physical Exam   ED Course/ Medical Decision Making/ A&P    Procedures Procedures   Medications Ordered in ED Medications - No data to display  Medical Decision Making:   [name] is a 78 y.o. male  who presented to the ED today with altered mentation and hypotension detailed above.    Handoff received from EMS.  Patient placed on continuous vitals and telemetry monitoring while in ED which was reviewed periodically.  Complete initial physical exam performed, notably the patient  was for skin turgor and dry oral mucosa are noted on exam.     Reviewed and confirmed nursing documentation for past medical history, family history, social history.    Initial Assessment:   With the patient's presentation of Hypotension and altered mentation, most likely diagnosis is fluid volume deficit. Other diagnoses were considered including (but not limited to) hypoglycemia, CVA. These are considered less likely due to history of present illness and physical exam findings.     Initial Plan:  Continue IV fluid replacement and monitor blood pressures Screening labs including CBC and Metabolic panel to evaluate for infectious or metabolic etiology of disease.  Urinalysis with reflex culture ordered to evaluate for UTI or relevant urologic/nephrologic pathology.  EKG to evaluate for cardiac pathology Objective evaluation as below reviewed   Initial Study Results:   Laboratory  All laboratory results reviewed without evidence of clinically relevant pathology.   Exceptions include: Elevated lactate of 260, BUN 25 and creatinine of 1.53 indicating prerenal azotemia.  Hematocrit of 34.2.  EKG EKG was reviewed independently. Rate, rhythm, axis, intervals all examined and without medically relevant abnormality. ST segments without concerns for elevations.    Reviewed previous  EKGs and do not notice any significant change from previous.  Radiology:  All images reviewed independently. Agree with radiology report at this time.   CT HEAD WO CONTRAST ( ) Result Date: 08/18/2023 CLINICAL DATA:  Altered mental status EXAM: CT HEAD WITHOUT CONTRAST TECHNIQUE: Contiguous axial images were obtained from the base of  the skull through the vertex without intravenous contrast. RADIATION DOSE REDUCTION: This exam was performed according to the departmental dose-optimization program which includes automated exposure control, adjustment of the mA and/or kV according to patient size and/or use of iterative reconstruction technique. COMPARISON:  06/22/2023 FINDINGS: Brain: There is atrophy and chronic small vessel disease changes. No acute intracranial abnormality. Specifically, no hemorrhage, hydrocephalus, mass lesion, acute infarction, or significant intracranial injury. Vascular: No hyperdense vessel or unexpected calcification. Skull: No acute calvarial abnormality. Sinuses/Orbits: No acute findings Other: None IMPRESSION: Atrophy, chronic microvascular disease. No acute intracranial abnormality. Electronically Signed   By: Janeece Mechanic M.D.   On: 08/18/2023 01:14   DG Chest Port 1 View Result Date: 08/18/2023 CLINICAL DATA:  Altered mental status, chest pain EXAM: PORTABLE CHEST 1 VIEW COMPARISON:  06/22/2023 FINDINGS: Heart and mediastinal contours are within normal limits. No focal opacities or effusions. No acute bony abnormality. Aortic atherosclerosis IMPRESSION: No active disease. Electronically Signed   By: Janeece Mechanic M.D.   On: 08/18/2023 01:09     Reassessment and Plan:   Labs have returned with evidence of prerenal azotemia with a creatinine of 1.53 and BUN 25.  Further indicated elevated lactate of 260 with a hematocrit also noted at 34.2 suggesting hemoconcentration and further evidence of fluid deficit.  Plan to continue fluid resuscitation and monitoring of patient's status.   Patient signed off to Britini Henderly, PA at end of shift.  Clinical Impression: No diagnosis found.   Data Unavailable   Final Clinical Impression(s) / ED Diagnoses Final diagnoses:  None    Rx / DC Orders ED Discharge Orders     None         Juanetta Nordmann, PA 09/16/23 1508    Deatra Face,  MD 09/19/23 4098    Deatra Face, MD 09/19/23 2150

## 2023-09-17 ENCOUNTER — Inpatient Hospital Stay: Admitting: Adult Health

## 2023-09-17 DIAGNOSIS — R55 Syncope and collapse: Secondary | ICD-10-CM | POA: Diagnosis not present

## 2023-09-17 LAB — CBC
HCT: 34.7 % — ABNORMAL LOW (ref 39.0–52.0)
Hemoglobin: 10.6 g/dL — ABNORMAL LOW (ref 13.0–17.0)
MCH: 26.6 pg (ref 26.0–34.0)
MCHC: 30.5 g/dL (ref 30.0–36.0)
MCV: 87 fL (ref 80.0–100.0)
Platelets: 210 10*3/uL (ref 150–400)
RBC: 3.99 MIL/uL — ABNORMAL LOW (ref 4.22–5.81)
RDW: 15.2 % (ref 11.5–15.5)
WBC: 9.2 10*3/uL (ref 4.0–10.5)
nRBC: 0 % (ref 0.0–0.2)

## 2023-09-17 LAB — BASIC METABOLIC PANEL WITH GFR
Anion gap: 8 (ref 5–15)
BUN: 24 mg/dL — ABNORMAL HIGH (ref 8–23)
CO2: 25 mmol/L (ref 22–32)
Calcium: 9.3 mg/dL (ref 8.9–10.3)
Chloride: 108 mmol/L (ref 98–111)
Creatinine, Ser: 1.21 mg/dL (ref 0.61–1.24)
GFR, Estimated: >60 mL/min (ref >60)
Glucose, Bld: 88 mg/dL (ref 70–99)
Potassium: 4.1 mmol/L (ref 3.5–5.1)
Sodium: 141 mmol/L (ref 135–145)

## 2023-09-17 MED ORDER — PANTOPRAZOLE SODIUM 40 MG PO TBEC
40.0000 mg | DELAYED_RELEASE_TABLET | Freq: Every day | ORAL | Status: DC
  Administered 2023-09-17 – 2023-09-19 (×3): 40 mg via ORAL
  Filled 2023-09-17 (×3): qty 1

## 2023-09-17 MED ORDER — ORAL CARE MOUTH RINSE
15.0000 mL | OROMUCOSAL | Status: DC
Start: 2023-09-17 — End: 2023-09-18
  Administered 2023-09-17 (×4): 15 mL via OROMUCOSAL

## 2023-09-17 MED ORDER — FOLIC ACID 1 MG PO TABS
1.0000 mg | ORAL_TABLET | Freq: Every day | ORAL | Status: DC
  Administered 2023-09-17 – 2023-09-19 (×3): 1 mg via ORAL
  Filled 2023-09-17 (×3): qty 1

## 2023-09-17 MED ORDER — SENNOSIDES-DOCUSATE SODIUM 8.6-50 MG PO TABS
1.0000 | ORAL_TABLET | Freq: Every evening | ORAL | Status: DC | PRN
  Administered 2023-09-17: 1 via ORAL
  Filled 2023-09-17: qty 1

## 2023-09-17 MED ORDER — FINASTERIDE 5 MG PO TABS
5.0000 mg | ORAL_TABLET | Freq: Every day | ORAL | Status: DC
  Administered 2023-09-17 – 2023-09-19 (×3): 5 mg via ORAL
  Filled 2023-09-17 (×3): qty 1

## 2023-09-17 MED ORDER — POLYETHYLENE GLYCOL 3350 17 G PO PACK: 17.0000 g | PACK | Freq: Every day | ORAL | Status: DC | PRN

## 2023-09-17 MED ORDER — DOCUSATE SODIUM 100 MG PO CAPS
100.0000 mg | ORAL_CAPSULE | Freq: Two times a day (BID) | ORAL | Status: DC
  Administered 2023-09-17 – 2023-09-18 (×3): 100 mg via ORAL
  Filled 2023-09-17 (×3): qty 1

## 2023-09-17 MED ORDER — TAMSULOSIN HCL 0.4 MG PO CAPS
0.4000 mg | ORAL_CAPSULE | Freq: Every day | ORAL | Status: DC
  Administered 2023-09-17 – 2023-09-19 (×3): 0.4 mg via ORAL
  Filled 2023-09-17 (×3): qty 1

## 2023-09-17 MED ORDER — ATORVASTATIN CALCIUM 40 MG PO TABS
40.0000 mg | ORAL_TABLET | Freq: Every day | ORAL | Status: DC
  Administered 2023-09-17 – 2023-09-19 (×3): 40 mg via ORAL
  Filled 2023-09-17 (×3): qty 1

## 2023-09-17 MED ORDER — FLUOXETINE HCL 20 MG PO CAPS
20.0000 mg | ORAL_CAPSULE | Freq: Every day | ORAL | Status: DC
  Administered 2023-09-17 – 2023-09-19 (×3): 20 mg via ORAL
  Filled 2023-09-17 (×3): qty 1

## 2023-09-17 MED ORDER — APIXABAN 5 MG PO TABS
5.0000 mg | ORAL_TABLET | Freq: Two times a day (BID) | ORAL | Status: DC
  Administered 2023-09-17 – 2023-09-18 (×2): 5 mg via ORAL
  Filled 2023-09-17 (×2): qty 1

## 2023-09-17 MED ORDER — QUETIAPINE FUMARATE 50 MG PO TABS
50.0000 mg | ORAL_TABLET | Freq: Every day | ORAL | Status: DC
  Administered 2023-09-17 – 2023-09-18 (×2): 50 mg via ORAL
  Filled 2023-09-17 (×2): qty 1

## 2023-09-17 MED ORDER — QUETIAPINE FUMARATE 25 MG PO TABS
25.0000 mg | ORAL_TABLET | Freq: Every day | ORAL | Status: DC
  Administered 2023-09-18 – 2023-09-19 (×2): 25 mg via ORAL
  Filled 2023-09-17 (×2): qty 1

## 2023-09-17 MED ORDER — CARVEDILOL 3.125 MG PO TABS
3.1250 mg | ORAL_TABLET | Freq: Two times a day (BID) | ORAL | Status: DC
  Administered 2023-09-17 – 2023-09-19 (×4): 3.125 mg via ORAL
  Filled 2023-09-17 (×4): qty 1

## 2023-09-17 NOTE — TOC Initial Note (Signed)
 Transition of Care [facility]) - Initial/Assessment Note    Patient Details  Name: Dennis [name] MRN: [id number] Date of Birth: [birth year]  [facility] CM/SW Contact:    Gertha Ku, LCSW Phone Number: 09/17/2023, 3:39 PM  Clinical Narrative:                  CSW spoke with Dede, an Production designer, theatre/television/film at [facility], who reported that the pt is from their memory care unit. She stated they will need to review the patient's discharge summary and FL2 form before the pt can return. She provided CSW with contact information for the APS worker, as the pt is now a ward of [facility].  CSW spoke with APS worker Kathaleen Pale [phone]), who confirmed that [facility] obtained guardianship about a week ago. CSW requested the guardianship paperwork, which was faxed and placed in the chart. CSW also discussed the Medicare notice and the pt's concerns about his current facility. The APS worker shared that the pt had been living with his long-term girlfriend for about 30 years; both were recently diagnosed with dementia and placed in a facility. CSW explained that the pt would need to work with the current facility if he wishes to transfer to a new one. Pt's APS worker would like to speak with RN to receive an update. RN made aware. TOC to follow.     Expected Discharge Plan: Memory Care Barriers to Discharge: Continued Medical Work up   Patient Goals and CMS Choice Patient states their goals for this hospitalization and ongoing recovery are:: retrun to Memory Care facility          Expected Discharge Plan and Services       Living arrangements for the past 2 months: Assisted Living Facility                                      Prior Living Arrangements/Services Living arrangements for the past 2 months: Assisted Living Facility Lives with:: Self Patient language and need for interpreter reviewed:: Yes        Need for Family Participation in Patient  Care: Yes (Comment) Care giver support system in place?: Yes (comment)   Criminal Activity/Legal Involvement Pertinent to Current Situation/Hospitalization: No - Comment as needed  Activities of Daily Living   ADL Screening (condition at time of admission) Independently performs ADLs?: Yes (appropriate for developmental age) Is the patient deaf or have difficulty hearing?: No Does the patient have difficulty seeing, even when wearing glasses/contacts?: No Does the patient have difficulty concentrating, remembering, or making decisions?: No  Permission Sought/Granted                  Emotional Assessment              Admission diagnosis:  Syncope [R55] Patient Active Problem List   Diagnosis Date Noted   Syncope 09/16/2023   AKI (acute kidney injury) (HCC) 09/16/2023   UTI (urinary tract infection) 09/16/2023   Acute metabolic encephalopathy 09/16/2023   History of stroke 09/16/2023   Agitation 07/10/2023   Dysphagia 07/02/2023   Dementia with behavioral disturbance (HCC) 07/02/2023   Acute CVA (cerebrovascular accident) (HCC) 06/21/2023   BPH (benign prostatic hyperplasia) 06/21/2023   Altered mental state 02/22/2023   Difficulty walking 02/22/2023   HLD (hyperlipidemia) 06/02/2009   HTN (hypertension) 06/02/2009   AORTIC INSUFFICIENCY, MILD 06/02/2009  DYSPNEA ON EXERTION 06/02/2009   CHEST PAIN, ATYPICAL 06/02/2009   PCP:  Vicente Graham No Pharmacy:   [facility] [address] ([address]), [place] - [address] [place] ([place]) [place] [address] Phone: [phone] Fax: [phone]     Social Drivers of Health (SDOH) Social History: SDOH Screenings   Food Insecurity: No Food Insecurity (09/17/2023)  Housing: Low Risk  (09/17/2023)  Transportation Needs: No Transportation Needs (09/17/2023)  Utilities: Not At Risk (09/17/2023)  Social Connections: Moderately Isolated (09/17/2023)  Tobacco Use: Medium Risk (09/16/2023)   SDOH Interventions:      Readmission Risk Interventions     No data to display

## 2023-09-17 NOTE — Progress Notes (Addendum)
 Pt currently resides at [facility] and has requested not to return there due to unsafe living environment from staff. Pt has altered mental status and has a tendency to mix up current events with past events, not sure of accuracy entered Social Worker [facility] ) consult to speak with patient and follow up. SRP, RN

## 2023-09-17 NOTE — Care Management Obs Status (Signed)
 MEDICARE OBSERVATION STATUS NOTIFICATION   Patient Details  Name: ILIJA CHEZEM MRN: [id number] Date of Birth: [birth year]   Medicare Observation Status Notification Given:  Yes    Gertha Ku, LCSW 09/17/2023, 4:25 PM

## 2023-09-17 NOTE — Progress Notes (Signed)
 PROGRESS NOTE    Dennis [name]  UEA:[id number] DOB: [birth year] DOA: 09/16/2023 PCP: Pcp, No  Chief Complaint  Patient presents with   Altered Mental Status    Brief Narrative:   78 y.o. male with medical history significant of dementia, hypertension, hyperlipidemia, CVA, HFrEF, BPH, mood disorder, RA, COPD, OSA not on CPAP.  Patient was admitted to the hospital 2/1-3/27/2025 for acute CVA.  He's presented again for evaluation of syncope/AMS/hypotension.  Apparently was sitting in Cyler Kappes chair and had syncopal episode and became unresponsive.  He was more confused than baseline.  Hypotensive (SBP in 80's) with EMS, improved after 200 cc NS.  Was also mildly brady with EMS.    Assessment & Plan:   Principal Problem:   Syncope Active Problems:   HLD (hyperlipidemia)   HTN (hypertension)   AKI (acute kidney injury) (HCC)   UTI (urinary tract infection)   Acute metabolic encephalopathy   History of stroke  Syncope Patient was hypotensive with EMS with SBP in the 80s -> hypotension is most likely cause of his presentation. Blood pressure has now improved after IV fluids.   EKG appears similar to priors with T wave inversions in I, aVL, V4-V6.  Trop flat.  Not c/w ACS. Mild bradycardia (sinus) Negative orthostatics Notably, plan after recent discharge was to repeat echo in 3 months  Unclear what causes his hypotension at presentation -> possibly related to antihypertensives, but not clear anything is new or has changed recently.  Infectious workup ongoing with pending urine culture.  CXR without acute findings.  Head CT without acute findings.  Will plan to gradually resume home antihypertensives.  I called [facility], but no one answered to help provide additional history regarding his presentation.    AKI Baseline cr appears to be 1.2 Presented with creatinine 1.53 Follow, improved today   Pyuria UA with evidence of pyuria and bacteriuria.  Hypotension likely from dehydration, now  improved after IV fluids.  No fever, tachycardia, leukocytosis, or lactic acidosis to suggest sepsis.   Not clear this represents UTI, was started on abx at presentation Will follow final culture   Acute metabolic encephalopathy in the setting of baseline dementia CT head negative for acute intracranial abnormality and no focal neurodeficit on exam.   Check TSH (wnl), B12 (wnl), and ammonia level (wnl). Delirium precautions   Hypertension Resume coreg  Hold imdur  and hydralazine  for now   HFrEF 06/2023 echo with newly decreased EF (20-25%), aortic valve regurg, likely underestimated (no AS)   Chronic normocytic anemia trend   Hyperlipidemia Statin  History of CVA Eliquis , statin Will repeat echo, plan was to repeat echo in 3 months and continue eliquis  until EF >30-35% (see 06/23/23 neurology note from Dr. Christiane Cowing)  BPH Proscar  Caution with flomax    Mood disorder Seroquel   RA Doesn't appear to be on any meds  COPD: Stable, no signs of acute exacerbation.  Will reach back out to [facility] tomorrow for collateral (no one answered today).  [name] (guardian) has been guardian for about 1 week, doesn't have much information on what happened prior to his presentation.     DVT prophylaxis: eliquis  Code Status: full Family Communication: discussed with new guardian, joe Disposition:   Status is: Observation The patient remains OBS appropriate and will d/c before 2 midnights.   Consultants:  none  Procedures:  none  Antimicrobials:  Anti-infectives (From admission, onward)    Start     Dose/Rate Route Frequency Ordered Stop   09/17/23 2000  cefTRIAXone  (ROCEPHIN ) 1 g in sodium chloride  0.9 % 100 mL IVPB        1 g 200 mL/hr over 30 Minutes Intravenous Every 24 hours 09/16/23 2104     09/16/23 2000  cefTRIAXone  (ROCEPHIN ) 1 g in sodium chloride  0.9 % 100 mL IVPB        1 g 200 mL/hr over 30 Minutes Intravenous  Once 09/16/23 1952 09/16/23 2054        Subjective: Says he was walking and then passed out No CP or SOB Does remember lightheadedness  Objective: Vitals:   09/17/23 0426 09/17/23 0919 09/17/23 1230 09/17/23 1303  BP: (!) 140/71 139/69 (!) 169/83 (!) 154/71  Pulse: 66 72 74 69  Resp: 20 20 14 20   Temp: 97.6 F (36.4 C) 97.8 F (36.6 C) 98.1 F (36.7 C) 98.2 F (36.8 C)  TempSrc:  Oral Oral Oral  SpO2: 99% 94% 96% 96%  Weight:      Height:        Intake/Output Summary (Last 24 hours) at 09/17/2023 1710 Last data filed at 09/17/2023 1500 Gross per 24 hour  Intake 0 ml  Output 900 ml  Net -900 ml   Filed Weights   09/16/23 1348  Weight: 63.9 kg    Examination:  General exam: Appears calm and comfortable  Respiratory system: unlabored Cardiovascular system: RRR Gastrointestinal system: Abdomen is nondistended, soft and nontender.  Central nervous system: pleasantly confused Extremities: no LEE   Data Reviewed: I have personally reviewed following labs and imaging studies  CBC: Recent Labs  Lab 09/16/23 1353 09/17/23 0418  WBC 9.2 9.2  NEUTROABS 7.0  --   HGB 10.6* 10.6*  HCT 34.2* 34.7*  MCV 87.5 87.0  PLT 202 210    Basic Metabolic Panel: Recent Labs  Lab 09/16/23 1353 09/17/23 0418  NA 138 141  K 4.7 4.1  CL 110 108  CO2 23 25  GLUCOSE 105* 88  BUN 25* 24*  CREATININE 1.53* 1.21  CALCIUM  8.4* 9.3    GFR: Estimated Creatinine Clearance: 41.5 mL/min (by C-G formula based on SCr of 1.21 mg/dL).  Liver Function Tests: Recent Labs  Lab 09/16/23 1353  AST 28  ALT 12  ALKPHOS 24*  BILITOT 1.1  PROT 6.0*  ALBUMIN 2.9*    CBG: Recent Labs  Lab 09/16/23 1935  GLUCAP 91     Recent Results (from the past 240 hours)  MRSA Next Gen by PCR, Nasal     Status: None   Collection Time: 09/16/23  9:37 PM   Specimen: Nasal Mucosa; Nasal Swab  Result Value Ref Range Status   MRSA by PCR Next Gen NOT DETECTED NOT DETECTED Final    Comment: (NOTE) The GeneXpert MRSA Assay  (FDA approved for NASAL specimens only), is one component of [facility] comprehensive MRSA colonization surveillance program. It is not intended to diagnose MRSA infection nor to guide or monitor treatment for MRSA infections. Test performance is not FDA approved in patients less than 52 years old. Performed at [facility], [address], [place], [place] [address]          Radiology Studies: CT HEAD WO CONTRAST ( ) Result Date: 09/16/2023 CLINICAL DATA:  Delirium EXAM: CT HEAD WITHOUT CONTRAST TECHNIQUE: Contiguous axial images were obtained from the base of the skull through the vertex without intravenous contrast. RADIATION DOSE REDUCTION: This exam was performed according to the departmental dose-optimization program which includes automated exposure control, adjustment of the mA and/or  kV according to patient size and/or use of iterative reconstruction technique. COMPARISON:  CT head 08/18/2023 FINDINGS: Brain: Cerebral ventricle sizes are concordant with the degree of cerebral volume loss. Patchy and confluent areas of decreased attenuation are noted throughout the deep and periventricular white matter of the cerebral hemispheres bilaterally, compatible with chronic microvascular ischemic disease. No evidence of large-territorial acute infarction. No parenchymal hemorrhage. No mass lesion. No extra-axial collection. No mass effect or midline shift. No hydrocephalus. Basilar cisterns are patent. Vascular: No hyperdense vessel. Atherosclerotic calcifications are present within the cavernous internal carotid and vertebral arteries. Skull: No acute fracture or focal lesion. Sinuses/Orbits: Paranasal sinuses and mastoid air cells are clear. The orbits are unremarkable. Other: None. IMPRESSION: No acute intracranial abnormality. Electronically Signed   By: Morgane  Naveau M.D.   On: 09/16/2023 17:06   DG Chest 2 View Result Date: 09/16/2023 CLINICAL DATA:  Altered mental status.  EXAM: CHEST - 2 VIEW COMPARISON:  August 18, 2023. FINDINGS: Stable cardiomediastinal silhouette. Both lungs are clear. The visualized skeletal structures are unremarkable. IMPRESSION: No active cardiopulmonary disease. Electronically Signed   By: Rosalene Colon M.D.   On: 09/16/2023 17:06        Scheduled Meds:  apixaban   5 mg Oral BID   atorvastatin   40 mg Oral Daily   carvedilol   3.125 mg Oral BID WC   docusate sodium   100 mg Oral BID   finasteride   5 mg Oral Daily   FLUoxetine   20 mg Oral Daily   folic acid   1 mg Oral Daily   mouth rinse  15 mL Mouth Rinse 4 times per day   pantoprazole   40 mg Oral Daily   [START ON 09/18/2023] QUEtiapine   25 mg Oral Daily   QUEtiapine   50 mg Oral q1800   tamsulosin   0.4 mg Oral Daily   Continuous Infusions:  cefTRIAXone  (ROCEPHIN )  IV       LOS: 0 days    Time spent: over 30 min    Donnetta Gains, MD [facility]   To contact the attending provider between 7A-7P or the covering provider during after hours 7P-7A, please log into the web site [email or web address] and access using universal [place] password for that web site. If you do not have the password, please call the hospital operator.  09/17/2023, 5:10 PM

## 2023-09-17 NOTE — Plan of Care (Signed)

## 2023-09-18 ENCOUNTER — Observation Stay (HOSPITAL_BASED_OUTPATIENT_CLINIC_OR_DEPARTMENT_OTHER)

## 2023-09-18 DIAGNOSIS — R55 Syncope and collapse: Secondary | ICD-10-CM

## 2023-09-18 LAB — CBC WITH DIFFERENTIAL/PLATELET
Abs Immature Granulocytes: 0.02 10*3/uL (ref 0.00–0.07)
Basophils Absolute: 0 10*3/uL (ref 0.0–0.1)
Basophils Relative: 0 %
Eosinophils Absolute: 0.3 10*3/uL (ref 0.0–0.5)
Eosinophils Relative: 4 %
HCT: 34.8 % — ABNORMAL LOW (ref 39.0–52.0)
Hemoglobin: 10.7 g/dL — ABNORMAL LOW (ref 13.0–17.0)
Immature Granulocytes: 0 %
Lymphocytes Relative: 30 %
Lymphs Abs: 2.1 10*3/uL (ref 0.7–4.0)
MCH: 26.7 pg (ref 26.0–34.0)
MCHC: 30.7 g/dL (ref 30.0–36.0)
MCV: 86.8 fL (ref 80.0–100.0)
Monocytes Absolute: 0.9 10*3/uL (ref 0.1–1.0)
Monocytes Relative: 12 %
Neutro Abs: 3.8 10*3/uL (ref 1.7–7.7)
Neutrophils Relative %: 54 %
Platelets: 201 10*3/uL (ref 150–400)
RBC: 4.01 MIL/uL — ABNORMAL LOW (ref 4.22–5.81)
RDW: 15.2 % (ref 11.5–15.5)
WBC: 7.1 10*3/uL (ref 4.0–10.5)
nRBC: 0 % (ref 0.0–0.2)

## 2023-09-18 LAB — COMPREHENSIVE METABOLIC PANEL WITH GFR
ALT: 12 U/L (ref 0–44)
AST: 16 U/L (ref 15–41)
Albumin: 2.7 g/dL — ABNORMAL LOW (ref 3.5–5.0)
Alkaline Phosphatase: 26 U/L — ABNORMAL LOW (ref 38–126)
Anion gap: 8 (ref 5–15)
BUN: 18 mg/dL (ref 8–23)
CO2: 24 mmol/L (ref 22–32)
Calcium: 8.9 mg/dL (ref 8.9–10.3)
Chloride: 108 mmol/L (ref 98–111)
Creatinine, Ser: 1.21 mg/dL (ref 0.61–1.24)
GFR, Estimated: >60 mL/min (ref >60)
Glucose, Bld: 85 mg/dL (ref 70–99)
Potassium: 3.8 mmol/L (ref 3.5–5.1)
Sodium: 140 mmol/L (ref 135–145)
Total Bilirubin: 0.6 mg/dL (ref 0.0–1.2)
Total Protein: 5.7 g/dL — ABNORMAL LOW (ref 6.5–8.1)

## 2023-09-18 LAB — ECHOCARDIOGRAM COMPLETE
AR max vel: 1.6 cm2
AV Area VTI: 1.67 cm2
AV Area mean vel: 1.53 cm2
AV Mean grad: 7 mmHg
AV Peak grad: 11.9 mmHg
Ao pk vel: 1.73 m/s
Area-P 1/2: 3.71 cm2
Calc EF: 42.3 %
Est EF: 45
Height: 64 in
P 1/2 time: 394 ms
S' Lateral: 4.05 cm
Single Plane A2C EF: 40 %
Single Plane A4C EF: 46.9 %
Weight: 2253.98 [oz_av]

## 2023-09-18 LAB — MAGNESIUM: Magnesium: 1.9 mg/dL (ref 1.7–2.4)

## 2023-09-18 LAB — PHOSPHORUS: Phosphorus: 3.1 mg/dL (ref 2.5–4.6)

## 2023-09-18 MED ORDER — ORAL CARE MOUTH RINSE: 15.0000 mL | OROMUCOSAL | Status: DC | PRN

## 2023-09-18 MED ORDER — ORAL CARE MOUTH RINSE
15.0000 mL | OROMUCOSAL | Status: DC
  Administered 2023-09-18 – 2023-09-19 (×6): 15 mL via OROMUCOSAL

## 2023-09-18 MED ORDER — ASPIRIN 81 MG PO TBEC
81.0000 mg | DELAYED_RELEASE_TABLET | Freq: Every day | ORAL | Status: DC
Start: 2023-09-19 — End: 2023-09-19
  Administered 2023-09-19: 81 mg via ORAL
  Filled 2023-09-18: qty 1

## 2023-09-18 MED ORDER — ENOXAPARIN SODIUM 40 MG/0.4ML IJ SOSY
40.0000 mg | PREFILLED_SYRINGE | INTRAMUSCULAR | Status: DC
  Administered 2023-09-18: 40 mg via SUBCUTANEOUS
  Filled 2023-09-18: qty 0.4

## 2023-09-18 NOTE — Plan of Care (Signed)

## 2023-09-18 NOTE — Progress Notes (Signed)
 PROGRESS NOTE    Dennis [name]  ZOX:[id number] DOB: [birth year] DOA: 09/16/2023 PCP: Pcp, No  Chief Complaint  Patient presents with   Altered Mental Status    Brief Narrative:   78 y.o. male with medical history significant of dementia, hypertension, hyperlipidemia, CVA, HFrEF, BPH, mood disorder, RA, COPD, OSA not on CPAP.  Patient was admitted to the hospital 2/1-3/27/2025 for acute CVA.  He's presented again for evaluation of syncope/AMS/hypotension.  Apparently was sitting in Jerritt Cardoza chair and had syncopal episode and became unresponsive.  He was more confused than baseline.  Hypotensive (SBP in 80's) with EMS, improved after 200 cc NS.  Was also mildly brady with EMS.    Assessment & Plan:   Principal Problem:   Syncope Active Problems:   HLD (hyperlipidemia)   HTN (hypertension)   AKI (acute kidney injury) (HCC)   UTI (urinary tract infection)   Acute metabolic encephalopathy   History of stroke  Altered Mental Status  Presyncope Hypotension Patient was hypotensive with EMS with SBP in the 80s -> hypotension is most likely cause of his presentation. Blood pressure has now improved after IV fluids.   EKG appears similar to priors with T wave inversions in I, aVL, V4-V6.  Trop flat.  Not c/w ACS. Mild bradycardia (sinus) Negative orthostatics Notably, plan after recent discharge was to repeat echo in 3 months  Unclear what caused his hypotension at presentation -> possibly related to antihypertensives, but not clear anything is new or has changed recently.  Infectious workup ongoing with pending urine culture (still pending today).  CXR without acute findings.  Head CT without acute findings.  Will plan to gradually resume home antihypertensives.  Per discussion with [facility] Staff today, he was eating breakfast and dropped his cup and became less alert, staring/dazed, not acting like himself - no clear syncopal event/episode.  Whole episode lasted less than 1 min (at baseline  he's confused, conversant, oriented to self only).    AKI Baseline cr appears to be 1.2 Presented with creatinine 1.53 Follow, improved today   Pyuria UA with evidence of pyuria and bacteriuria.  Hypotension likely from dehydration, now improved after IV fluids.  No fever, tachycardia, leukocytosis, or lactic acidosis to suggest sepsis.   Not clear this represents UTI, was started on abx at presentation Will follow final culture given hypotension at presentation   Acute metabolic encephalopathy in the setting of baseline dementia CT head negative for acute intracranial abnormality and no focal neurodeficit on exam.   Check TSH (wnl), B12 (wnl), and ammonia level (wnl). Delirium precautions Suspect he's close to baseline   Hypertension Resume coreg  Hold imdur  and hydralazine  for now   HFrEF 06/2023 echo with newly decreased EF (20-25%), aortic valve regurg, likely underestimated (no AS) Repeat echo today with EF 45%, grade II diastolic dysfunction, mildly reduced RVSF Outpatient cards follow up   Chronic normocytic anemia trend   Hyperlipidemia Statin  History of CVA Eliquis , statin Given improvement in EF to 45%, will d/c eliquis  and start aspirin .  Messaged cardmaster to arrange 30 day outpatient monitor and cards follow up.  Has scheduled neurology follow up on 5/14.  BPH Proscar  Caution with flomax    Mood disorder Seroquel   RA Doesn't appear to be on any meds  COPD: Stable, no signs of acute exacerbation.     DVT prophylaxis: eliquis  Code Status: full Family Communication: discussed with new guardian, joe Disposition:   Status is: Observation The patient remains OBS appropriate and will  d/c before 2 midnights.   Consultants:  none  Procedures:  none  Antimicrobials:  Anti-infectives (From admission, onward)    Start     Dose/Rate Route Frequency Ordered Stop   09/17/23 2000  cefTRIAXone  (ROCEPHIN ) 1 g in sodium chloride  0.9 % 100 mL IVPB         1 g 200 mL/hr over 30 Minutes Intravenous Every 24 hours 09/16/23 2104     09/16/23 2000  cefTRIAXone  (ROCEPHIN ) 1 g in sodium chloride  0.9 % 100 mL IVPB        1 g 200 mL/hr over 30 Minutes Intravenous  Once 09/16/23 1952 09/16/23 2054       Subjective: Asking for help with coffee Confused - tangential   Objective: Vitals:   09/17/23 1303 09/17/23 1738 09/17/23 2139 09/18/23 0528  BP: (!) 154/71 (!) 154/71 129/76 (!) 155/72  Pulse: 69 69 68 66  Resp: 20  18 18   Temp: 98.2 F (36.8 C)  97.7 F (36.5 C) 97.8 F (36.6 C)  TempSrc: Oral  Oral Oral  SpO2: 96%  93% 94%  Weight:      Height:        Intake/Output Summary (Last 24 hours) at 09/18/2023 1029 Last data filed at 09/18/2023 0900 Gross per 24 hour  Intake 100 ml  Output 750 ml  Net -650 ml   Filed Weights   09/16/23 1348  Weight: 63.9 kg    Examination:  General: No acute distress. Cardiovascular: RRR Lungs: unlabored Abdomen: Soft, nontender, nondistended  Neurological: Alert, disoriented. Moves all extremities 4. Cranial nerves II through XII grossly intact. Extremities: No clubbing or cyanosis. No edema.   Data Reviewed: I have personally reviewed following labs and imaging studies  CBC: Recent Labs  Lab 09/16/23 1353 09/17/23 0418 09/18/23 0412  WBC 9.2 9.2 7.1  NEUTROABS 7.0  --  3.8  HGB 10.6* 10.6* 10.7*  HCT 34.2* 34.7* 34.8*  MCV 87.5 87.0 86.8  PLT 202 210 201    Basic Metabolic Panel: Recent Labs  Lab 09/16/23 1353 09/17/23 0418 09/18/23 0412  NA 138 141 140  K 4.7 4.1 3.8  CL 110 108 108  CO2 23 25 24   GLUCOSE 105* 88 85  BUN 25* 24* 18  CREATININE 1.53* 1.21 1.21  CALCIUM  8.4* 9.3 8.9  MG  --   --  1.9  PHOS  --   --  3.1    GFR: Estimated Creatinine Clearance: 41.5 mL/min (by C-G formula based on SCr of 1.21 mg/dL).  Liver Function Tests: Recent Labs  Lab 09/16/23 1353 09/18/23 0412  AST 28 16  ALT 12 12  ALKPHOS 24* 26*  BILITOT 1.1 0.6  PROT 6.0* 5.7*   ALBUMIN 2.9* 2.7*    CBG: Recent Labs  Lab 09/16/23 1935  GLUCAP 91     Recent Results (from the past 240 hours)  MRSA Next Gen by PCR, Nasal     Status: None   Collection Time: 09/16/23  9:37 PM   Specimen: Nasal Mucosa; Nasal Swab  Result Value Ref Range Status   MRSA by PCR Next Gen NOT DETECTED NOT DETECTED Final    Comment: (NOTE) The GeneXpert MRSA Assay (FDA approved for NASAL specimens only), is one component of [employer] comprehensive MRSA colonization surveillance program. It is not intended to diagnose MRSA infection nor to guide or monitor treatment for MRSA infections. Test performance is not FDA approved in patients less than 43 years old. Performed at [facility], [address], [place], [place] [address]          Radiology Studies: ECHOCARDIOGRAM COMPLETE Result Date: 09/18/2023    ECHOCARDIOGRAM REPORT   Patient Name:   Dennis [name] Date of Exam: 09/18/2023 Medical Rec #:  [id number]     Height:       64.0 in Accession #:    [id number]    Weight:       140.9 lb Date of Birth:  Apr 24, 1946     BSA:          1.686 m Patient Age:    78 years      BP:           155/72 mmHg Patient Gender: M             HR:           65 bpm. Exam Location:  Inpatient Procedure: 2D Echo, Cardiac Doppler and Color Doppler (Both Spectral and Color            Flow Doppler were utilized during procedure). Indications:    R55 Syncope  History:        Patient has prior history of Echocardiogram examinations, most                 recent 06/21/2023. Stroke and COPD, Aortic Valve Disease,                 Signs/Symptoms:Syncope, Alzheimer's, Altered Mental Status,                 Dyspnea and Shortness of Breath; Risk Factors:Hypertension,                 Dyslipidemia and Sleep Apnea.  Sonographer:    Raynelle Callow RDCS Referring Phys: [phone] Esteen Delpriore CALDWELL POWELL JR IMPRESSIONS  1. Left ventricular ejection fraction, by estimation, is 45%. Left ventricular ejection fraction by 2D MOD biplane is  42.3 %. The left ventricle has mildly decreased function. The left ventricle demonstrates global hypokinesis. There is moderate concentric left ventricular hypertrophy. Left ventricular diastolic parameters are consistent with Grade II diastolic dysfunction (pseudonormalization).  2. Right ventricular systolic function is mildly reduced. The right ventricular size is normal. There is normal pulmonary artery systolic pressure. The estimated right ventricular systolic pressure is 12.0 mmHg.  3. Left atrial size was mild to moderately dilated.  4. The mitral valve is normal in structure. Trivial mitral valve regurgitation. No evidence of mitral stenosis.  5. The aortic valve is tricuspid. There is moderate calcification of the aortic valve. Aortic valve regurgitation is mild. Aortic valve sclerosis/calcification is present, without any evidence of aortic stenosis. Aortic valve mean gradient measures 7.0 mmHg.  6. The inferior vena cava is normal in size with greater than 50% respiratory variability, suggesting right atrial pressure of 3 mmHg. FINDINGS  Left Ventricle: Left ventricular ejection fraction, by estimation, is 45%. Left ventricular ejection fraction by 2D MOD biplane is 42.3 %. The left ventricle has mildly decreased function. The left ventricle demonstrates global hypokinesis. The left ventricular internal cavity size was normal in size. There is moderate concentric left ventricular hypertrophy. Left ventricular diastolic parameters are consistent with Grade II diastolic dysfunction (pseudonormalization). Right Ventricle: The right ventricular size is normal. No increase in right ventricular wall thickness. Right ventricular systolic function is mildly reduced. There is normal pulmonary artery systolic pressure. The tricuspid regurgitant velocity is 1.50 m/s, and with an assumed right atrial pressure of 3 mmHg, the estimated right  ventricular systolic pressure is 12.0 mmHg. Left Atrium: Left atrial size was  mild to moderately dilated. Right Atrium: Right atrial size was normal in size. Pericardium: There is no evidence of pericardial effusion. Mitral Valve: The mitral valve is normal in structure. Mild mitral annular calcification. Trivial mitral valve regurgitation. No evidence of mitral valve stenosis. Tricuspid Valve: The tricuspid valve is normal in structure. Tricuspid valve regurgitation is trivial. Aortic Valve: The aortic valve is tricuspid. There is moderate calcification of the aortic valve. Aortic valve regurgitation is mild. Aortic regurgitation PHT measures 394 msec. Aortic valve sclerosis/calcification is present, without any evidence of aortic stenosis. Aortic valve mean gradient measures 7.0 mmHg. Aortic valve peak gradient measures 11.9 mmHg. Aortic valve area, by VTI measures 1.67 cm. Pulmonic Valve: The pulmonic valve was normal in structure. Pulmonic valve regurgitation is trivial. Aorta: The aortic root is normal in size and structure. Venous: The inferior vena cava is normal in size with greater than 50% respiratory variability, suggesting right atrial pressure of 3 mmHg. IAS/Shunts: No atrial level shunt detected by color flow Doppler.  LEFT VENTRICLE PLAX 2D                        Biplane EF (MOD) LVIDd:         5.00 cm         LV Biplane EF:   Left LVIDs:         4.05 cm                          ventricular LV PW:         1.20 cm                          ejection LV IVS:        1.25 cm                          fraction by LVOT diam:     2.30 cm                          2D MOD LV SV:         60                               biplane is LV SV Index:   35                               42.3 %. LVOT Area:     4.15 cm                                Diastology                                LV e' medial:    6.72 cm/s LV Volumes (MOD)               LV E/e' medial:  7.5 LV vol d, MOD    112.0 ml      LV e' lateral:   7.15 cm/s A2C:  LV E/e' lateral: 7.0 LV vol d, MOD    107.0  ml A4C: LV vol s, MOD    67.2 ml A2C: LV vol s, MOD    56.8 ml A4C: LV SV MOD A2C:   44.8 ml LV SV MOD A4C:   107.0 ml LV SV MOD BP:    46.3 ml RIGHT VENTRICLE            IVC RV S prime:     9.96 cm/s  IVC diam: 1.70 cm TAPSE (M-mode): 2.0 cm LEFT ATRIUM           Index        RIGHT ATRIUM           Index LA diam:      3.60 cm 2.14 cm/m   RA Area:     16.80 cm LA Vol (A2C): 77.9 ml 46.21 ml/m  RA Volume:   48.00 ml  28.48 ml/m LA Vol (A4C): 37.3 ml 22.13 ml/m  AORTIC VALVE                     PULMONIC VALVE AV Area (Vmax):    1.60 cm      PR End Diast Vel: 2.21 msec AV Area (Vmean):   1.53 cm AV Area (VTI):     1.67 cm AV Vmax:           172.75 cm/s AV Vmean:          115.000 cm/s AV VTI:            0.359 m AV Peak Grad:      11.9 mmHg AV Mean Grad:      7.0 mmHg LVOT Vmax:         66.40 cm/s LVOT Vmean:        42.400 cm/s LVOT VTI:          0.144 m LVOT/AV VTI ratio: 0.40 AI PHT:            394 msec  AORTA Ao Root diam: 3.90 cm Ao Asc diam:  3.40 cm MITRAL VALVE               TRICUSPID VALVE MV Area (PHT): 3.71 cm    TR Peak grad:   9.0 mmHg MV Decel Time: 205 msec    TR Vmax:        150.00 cm/s MV E velocity: 50.35 cm/s MV Audrie Kuri velocity: 37.05 cm/s  SHUNTS MV E/Juwann Sherk ratio:  1.36        Systemic VTI:  0.14 m                            Systemic Diam: 2.30 cm Dalton McleanMD Electronically signed by Archer Bear Signature Date/Time: 09/18/2023/8:57:05 AM    Final    CT HEAD WO CONTRAST ( ) Result Date: 09/16/2023 CLINICAL DATA:  Delirium EXAM: CT HEAD WITHOUT CONTRAST TECHNIQUE: Contiguous axial images were obtained from the base of the skull through the vertex without intravenous contrast. RADIATION DOSE REDUCTION: This exam was performed according to the departmental dose-optimization program which includes automated exposure control, adjustment of the mA and/or kV according to patient size and/or use of iterative reconstruction technique. COMPARISON:  CT head 08/18/2023 FINDINGS: Brain: Cerebral ventricle  sizes are concordant with the degree of cerebral volume loss. Patchy and confluent areas of decreased attenuation are noted throughout the deep and periventricular white matter of the cerebral hemispheres bilaterally,  compatible with chronic microvascular ischemic disease. No evidence of large-territorial acute infarction. No parenchymal hemorrhage. No mass lesion. No extra-axial collection. No mass effect or midline shift. No hydrocephalus. Basilar cisterns are patent. Vascular: No hyperdense vessel. Atherosclerotic calcifications are present within the cavernous internal carotid and vertebral arteries. Skull: No acute fracture or focal lesion. Sinuses/Orbits: Paranasal sinuses and mastoid air cells are clear. The orbits are unremarkable. Other: None. IMPRESSION: No acute intracranial abnormality. Electronically Signed   By: Morgane  Naveau M.D.   On: 09/16/2023 17:06   DG Chest 2 View Result Date: 09/16/2023 CLINICAL DATA:  Altered mental status. EXAM: CHEST - 2 VIEW COMPARISON:  August 18, 2023. FINDINGS: Stable cardiomediastinal silhouette. Both lungs are clear. The visualized skeletal structures are unremarkable. IMPRESSION: No active cardiopulmonary disease. Electronically Signed   By: Rosalene Colon M.D.   On: 09/16/2023 17:06        Scheduled Meds:  apixaban   5 mg Oral BID   atorvastatin   40 mg Oral Daily   carvedilol   3.125 mg Oral BID WC   docusate sodium   100 mg Oral BID   finasteride   5 mg Oral Daily   FLUoxetine   20 mg Oral Daily   folic acid   1 mg Oral Daily   mouth rinse  15 mL Mouth Rinse 4 times per day   pantoprazole   40 mg Oral Daily   QUEtiapine   25 mg Oral Daily   QUEtiapine   50 mg Oral q1800   tamsulosin   0.4 mg Oral Daily   Continuous Infusions:  cefTRIAXone  (ROCEPHIN )  IV 1 g (09/17/23 2057)     LOS: 0 days    Time spent: over 30 min    Donnetta Gains, MD [facility]   To contact the attending provider between 7A-7P or the covering provider  during after hours 7P-7A, please log into the web site [email or web address] and access using universal [place] password for that web site. If you do not have the password, please call the hospital operator.  09/18/2023, 10:29 AM

## 2023-09-18 NOTE — Plan of Care (Signed)

## 2023-09-18 NOTE — Plan of Care (Signed)
 Contacted by Dr. Ada Acres. Pt admitted for syncope with low BP. Also found to have UTI and AKI. Echo repeat showed EF 45%. Therefore, I think he can come out of eliquis  and will put on ASA 81. Will also recommend 30 day cardiac event monitoring to rule out afib. Recommend outpt cardiology follow up. He will see Johny Nap NP at [facility] on 5/14. Discussed with Dr. Ada Acres.  Consuelo Denmark, MD PhD Stroke Neurology 09/18/2023 10:35 AM

## 2023-09-18 NOTE — Progress Notes (Signed)
  Echocardiogram 2D Echocardiogram has been performed.  Royden Corin 09/18/2023, 8:53 AM

## 2023-09-19 DIAGNOSIS — R55 Syncope and collapse: Secondary | ICD-10-CM | POA: Diagnosis not present

## 2023-09-19 LAB — CBC WITH DIFFERENTIAL/PLATELET
Abs Immature Granulocytes: 0.03 10*3/uL (ref 0.00–0.07)
Basophils Absolute: 0 10*3/uL (ref 0.0–0.1)
Basophils Relative: 0 %
Eosinophils Absolute: 0.3 10*3/uL (ref 0.0–0.5)
Eosinophils Relative: 4 %
HCT: 38.2 % — ABNORMAL LOW (ref 39.0–52.0)
Hemoglobin: 11.4 g/dL — ABNORMAL LOW (ref 13.0–17.0)
Immature Granulocytes: 0 %
Lymphocytes Relative: 28 %
Lymphs Abs: 2.3 10*3/uL (ref 0.7–4.0)
MCH: 26.5 pg (ref 26.0–34.0)
MCHC: 29.8 g/dL — ABNORMAL LOW (ref 30.0–36.0)
MCV: 88.6 fL (ref 80.0–100.0)
Monocytes Absolute: 0.9 10*3/uL (ref 0.1–1.0)
Monocytes Relative: 11 %
Neutro Abs: 4.6 10*3/uL (ref 1.7–7.7)
Neutrophils Relative %: 57 %
Platelets: 220 10*3/uL (ref 150–400)
RBC: 4.31 MIL/uL (ref 4.22–5.81)
RDW: 15.2 % (ref 11.5–15.5)
WBC: 8.2 10*3/uL (ref 4.0–10.5)
nRBC: 0 % (ref 0.0–0.2)

## 2023-09-19 LAB — COMPREHENSIVE METABOLIC PANEL WITH GFR
ALT: 14 U/L (ref 0–44)
AST: 17 U/L (ref 15–41)
Albumin: 3.1 g/dL — ABNORMAL LOW (ref 3.5–5.0)
Alkaline Phosphatase: 27 U/L — ABNORMAL LOW (ref 38–126)
Anion gap: 6 (ref 5–15)
BUN: 20 mg/dL (ref 8–23)
CO2: 26 mmol/L (ref 22–32)
Calcium: 9.2 mg/dL (ref 8.9–10.3)
Chloride: 106 mmol/L (ref 98–111)
Creatinine, Ser: 1.2 mg/dL (ref 0.61–1.24)
GFR, Estimated: >60 mL/min (ref >60)
Glucose, Bld: 87 mg/dL (ref 70–99)
Potassium: 4 mmol/L (ref 3.5–5.1)
Sodium: 138 mmol/L (ref 135–145)
Total Bilirubin: 0.4 mg/dL (ref 0.0–1.2)
Total Protein: 6.4 g/dL — ABNORMAL LOW (ref 6.5–8.1)

## 2023-09-19 LAB — MAGNESIUM: Magnesium: 1.7 mg/dL (ref 1.7–2.4)

## 2023-09-19 LAB — PHOSPHORUS: Phosphorus: 3.3 mg/dL (ref 2.5–4.6)

## 2023-09-19 MED ORDER — ASPIRIN 81 MG PO TBEC: 81.0000 mg | DELAYED_RELEASE_TABLET | Freq: Every day | ORAL | Status: DC

## 2023-09-19 NOTE — Progress Notes (Signed)
 Mobility Specialist - Progress Note   09/19/23 1129  Mobility  Activity Ambulated with assistance to bathroom  Level of Assistance Contact guard assist, steadying assist  Assistive Device None  Distance Ambulated (ft) 20 ft  Range of Motion/Exercises Active  Activity Response Tolerated well  Mobility Referral Yes  Mobility visit 1 Mobility  Mobility Specialist Start Time (ACUTE ONLY) 1124  Mobility Specialist Stop Time (ACUTE ONLY) 1129  Mobility Specialist Time Calculation (min) (ACUTE ONLY) 5 min   Pt was found getting up from bed and assisted to bathroom. Afterwards returned to bed with all needs met. Call bell in reach.  Dennis [name] Mobility Specialist

## 2023-09-19 NOTE — TOC Transition Note (Signed)
 Transition of Care [facility]) - Discharge Note   Patient Details  Name: Dennis [name] MRN: [id number] Date of Birth: 16-Mar-1946  Transition of Care [facility] CM/SW Contact:  Gertha Ku, LCSW Phone Number: 09/19/2023, 11:44 AM   Clinical Narrative:    Pt to d/c back to [facility] . CSW has faxed d/c summary and FL2 to facility for review and is awaiting call back.  CSW received call back from facility requesting pt's diet be change on Fl2. Pt's diet was changed to "No added table salt." FL2 has been faxed out. RN to call report to [phone]. PTAR called no further TOC needs. TOC sign off.   Final next level of care: Memory Care Barriers to Discharge: No Barriers Identified   Patient Goals and CMS Choice Patient states their goals for this hospitalization and ongoing recovery are:: return to Memory care facility          Discharge Placement                  Name of family member notified: Kathaleen Pale (Legal Guardian)  [phone] (Mobile) Patient and family notified of of transfer: 09/19/23  Discharge Plan and Services Additional resources added to the After Visit Summary for                                       Social Drivers of Health (SDOH) Interventions SDOH Screenings   Food Insecurity: No Food Insecurity (09/17/2023)  Housing: Low Risk  (09/17/2023)  Transportation Needs: No Transportation Needs (09/17/2023)  Utilities: Not At Risk (09/17/2023)  Social Connections: Moderately Isolated (09/17/2023)  Tobacco Use: Medium Risk (09/16/2023)     Readmission Risk Interventions     No data to display

## 2023-09-19 NOTE — Discharge Summary (Signed)
 Physician Discharge Summary  Dennis [name] EAV:[id number] DOB: [birth year] DOA: 09/16/2023  PCP: Pcp, No  Admit date: 09/16/2023 Discharge date: 09/19/2023  Time spent: 40 minutes  Recommendations for Outpatient Follow-up:  Follow outpatient CBC/CMP  Follow blood pressure outpatient - resume imdur Dennis [name] gradually if appropriate Repeat UA/culture if symptoms of UTI If recurrent episodes of presyncope/AMS, consider EEG Eliquis  has been d/c'd with improvement of EF -> follow with neurology and cardiology outpatient Needs 30 day event monitor and cards follow up, discussed with cardsmaster prior to discharge   Discharge Diagnoses:  Principal Problem:   Syncope Active Problems:   HLD (hyperlipidemia)   HTN (hypertension)   AKI (acute kidney injury) (HCC)   UTI (urinary tract infection)   Acute metabolic encephalopathy   History of stroke   Discharge Condition: stable  Diet recommendation: heart healthy  Filed Weights   09/16/23 1348  Weight: 63.9 kg    History of present illness:   78 yo with hx dementia, HTN, dyslipidemia, HFrEF and multiple other medical issues who presented to the hospital for evaluation of presyncope/AMS/hypotension.  He was sitting in [employer] chair and dropped his cup, became less alert.  Lasted about 1 minute.  Was sent to the ED and found to be hypotensive with EMS.  He's improved here.  Presentation thought to be due to his hypotension.  We've stopped his imdur Dennis [name].  Was treated for possible UTI (low suspicion).   Stable for discharge today.    See below for additional details.   Hospital Course:  Assessment and Plan:  Altered Mental Status  Presyncope Hypotension Patient was hypotensive with EMS with SBP in the 80s -> hypotension is most likely cause of his presentation. Blood pressure has now improved after IV fluids.   EKG appears similar to priors with T wave inversions in I, aVL, V4-V6.  Trop flat.  Not c/w ACS. Mild bradycardia (sinus) Negative  orthostatics Notably, plan after recent discharge was to repeat echo in 3 months  Unclear what caused his hypotension at presentation -> possibly related to antihypertensives, but not clear anything is new or has changed recently.  Received 3 days abx for possible UTI, though only with pyuria, low suspicion for true UTI.  CXR without acute findings.  Head CT without acute findings.  Will plan to gradually resume home antihypertensives.  Per discussion with Dennis [name] Hospital Staff, he was eating breakfast and dropped his cup and became less alert, staring/dazed, not acting like himself - no clear syncopal event/episode.  Whole episode lasted less than 1 min (at baseline he's confused, conversant, oriented to self only).  If recurrent, consideration of EEG would be reasonable, but I think hypotension explains current presentation.   AKI Baseline cr appears to be 1.2 Presented with creatinine 1.53 Follow, improved today   Pyuria UA with evidence of pyuria and bacteriuria.  Hypotension likely from dehydration, now improved after IV fluids.  No fever, tachycardia, leukocytosis, or lactic acidosis to suggest sepsis.  Not clear this represents UTI, was started on abx at presentation.  Called micro today as urine culture not resulted.  They did not have Dennis [name] sample.  At this point, will d/c abx.  If new symptoms would reculture.    Acute metabolic encephalopathy in the setting of baseline dementia CT head negative for acute intracranial abnormality and no focal neurodeficit on exam.   Check TSH (wnl), B12 (wnl), and ammonia level (wnl). Delirium precautions Suspect he's close to baseline   Hypertension Resume coreg  Hold imdur  and  hydralazine  for now - if BP stable outpatient, would gradually resume as tolerated   HFrEF 06/2023 echo with newly decreased EF (20-25%), aortic valve regurg, likely underestimated (no AS) Repeat echo with EF 45%, grade II diastolic dysfunction, mildly reduced RVSF Outpatient  cards follow up - requested via cardmaster   Chronic normocytic anemia trend   Hyperlipidemia Statin   History of CVA Eliquis , statin Given improvement in EF to 45%, will d/c eliquis  and start aspirin .  Messaged cardmaster to arrange 30 day outpatient monitor and cards follow up.  Has scheduled neurology follow up on 5/14.   BPH Proscar  Caution with flomax  with presyncope above   Mood disorder Seroquel    RA Doesn't appear to be on any meds   COPD: Stable, no signs of acute exacerbation.     Procedures: Echo IMPRESSIONS     1. Left ventricular ejection fraction, by estimation, is 45%. Left  ventricular ejection fraction by 2D MOD biplane is 42.3 %. The left  ventricle has mildly decreased function. The left ventricle demonstrates  global hypokinesis. There is moderate  concentric left ventricular hypertrophy. Left ventricular diastolic  parameters are consistent with Grade II diastolic dysfunction  (pseudonormalization).   2. Right ventricular systolic function is mildly reduced. The right  ventricular size is normal. There is normal pulmonary artery systolic  pressure. The estimated right ventricular systolic pressure is 12.0 mmHg.   3. Left atrial size was mild to moderately dilated.   4. The mitral valve is normal in structure. Trivial mitral valve  regurgitation. No evidence of mitral stenosis.   5. The aortic valve is tricuspid. There is moderate calcification of the  aortic valve. Aortic valve regurgitation is mild. Aortic valve  sclerosis/calcification is present, without any evidence of aortic  stenosis. Aortic valve mean gradient measures 7.0  mmHg.   6. The inferior vena cava is normal in size with greater than 50%  respiratory variability, suggesting right atrial pressure of 3 mmHg.   Consultations: none  Discharge Exam: Vitals:   09/18/23 2032 09/19/23 0635  BP: (!) 130/53 (!) 156/65  Pulse: 63 66  Resp: 18 18  Temp: 98 F (36.7 C) 98 F (36.7  C)  SpO2: 94% 93%   No complaints Eager to discharge Called guardian, but no answer  General: No acute distress. Cardiovascular: RRR Lungs: unlabored Abdomen: Soft, nontender, nondistended  Neurological: Alert and oriented 3. Moves all extremities 4 with equal strength. Cranial nerves II through XII grossly intact. Extremities: No clubbing or cyanosis. No edema.   Discharge Instructions   Discharge Instructions     Call MD for:  difficulty breathing, headache or visual disturbances   Complete by: As directed    Call MD for:  extreme fatigue   Complete by: As directed    Call MD for:  hives   Complete by: As directed    Call MD for:  persistant dizziness or light-headedness   Complete by: As directed    Call MD for:  persistant nausea and vomiting   Complete by: As directed    Call MD for:  redness, tenderness, or signs of infection (pain, swelling, redness, odor or green/yellow discharge around incision site)   Complete by: As directed    Call MD for:  severe uncontrolled pain   Complete by: As directed    Call MD for:  temperature >100.4   Complete by: As directed    Diet - low sodium heart healthy   Complete by: As directed  Discharge instructions   Complete by: As directed    You were seen after an episode of low blood pressure with altered mental status.    We stopped your hydralazine  and imdur .  Your blood pressure should be followed closely outpatient.  If your blood pressure is stable, these medicines may be gradually resumed.  Your echo showed improved function since February.  We can stop your eliquis .  We'll arrange outpatient cardiology follow up and Sharen Youngren cardiac monitor to look for abnormal heart rhythms that put you at risk of stroke.  We treated you for [place]ome[place] UTI presumptively, but it's not clear this was present.  If you have pain with urination, frequent urination, abdominal pain, urgency, or other urinary symptoms, your urine should be repeated and sent for  [place] urine culture.  Return for new, recurrent, and worsening symptoms.  Please ask your PCP to request records from this hospitalization so they know what was done and what the next steps will be.   Increase activity slowly   Complete by: As directed       Allergies as of 09/19/2023       Reactions   Codeine    Shellfish Allergy         Medication List     STOP taking these medications    apixaban  5 MG Tabs tablet Commonly known as: ELIQUIS    hydrALAZINE  25 MG tablet Commonly known as: APRESOLINE    isosorbide  mononitrate 30 MG 24 hr tablet Commonly known as: IMDUR        TAKE these medications    aspirin  EC 81 MG tablet Take 1 tablet (81 mg total) by mouth daily. Swallow whole. Start taking on: Sep 20, 2023   atorvastatin  40 MG tablet Commonly known as: LIPITOR  Take 1 tablet (40 mg total) by mouth daily.   carvedilol  3.125 MG tablet Commonly known as: COREG  Take 1 tablet (3.125 mg total) by mouth 2 (two) times daily with Naquita Nappier meal.   docusate sodium  100 MG capsule Commonly known as: COLACE Take 1 capsule (100 mg total) by mouth 2 (two) times daily.   finasteride  5 MG tablet Commonly known as: PROSCAR  Take 1 tablet (5 mg total) by mouth daily.   FLUoxetine  20 MG capsule Commonly known as: PROZAC  Take 20 mg by mouth daily.   folic acid  1 MG tablet Commonly known as: FOLVITE  Take 1 tablet (1 mg total) by mouth daily.   pantoprazole  40 MG tablet Commonly known as: PROTONIX  Take 1 tablet (40 mg total) by mouth daily.   polyethylene glycol 17 g packet Commonly known as: MIRALAX  / GLYCOLAX  Take 17 g by mouth daily as needed.   QUEtiapine  50 MG tablet Commonly known as: SEROQUEL  Take 1 tablet (50 mg total) by mouth daily at 6 PM.   QUEtiapine  25 MG tablet Commonly known as: SEROQUEL  Take 1 tablet (25 mg total) by mouth every morning.   senna-docusate 8.6-50 MG tablet Commonly known as: Senokot-S Take 1 tablet by mouth at bedtime as needed for mild  constipation.   tamsulosin  0.4 MG Caps capsule Commonly known as: FLOMAX  Take 1 capsule (0.4 mg total) by mouth daily.       Allergies  Allergen Reactions   Codeine    Shellfish Allergy       The results of significant diagnostics from this hospitalization (including imaging, microbiology, ancillary and laboratory) are listed below for reference.    Significant Diagnostic Studies: ECHOCARDIOGRAM COMPLETE Result Date: 09/18/2023    ECHOCARDIOGRAM REPORT  Patient Name:   Dennis [name] Date of Exam: 09/18/2023 Medical Rec #:  [id number]     Height:       64.0 in Accession #:    [id number]    Weight:       140.9 lb Date of Birth:  [birth year]     BSA:          1.686 m Patient Age:    78 years      BP:           155/72 mmHg Patient Gender: M             HR:           65 bpm. Exam Location:  Inpatient Procedure: 2D Echo, Cardiac Doppler and Color Doppler (Both Spectral and Color            Flow Doppler were utilized during procedure). Indications:    R55 Syncope  History:        Patient has prior history of Echocardiogram examinations, most                 recent 06/21/2023. Stroke and COPD, Aortic Valve Disease,                 Signs/Symptoms:Syncope, Alzheimer's, Altered Mental Status,                 Dyspnea and Shortness of Breath; Risk Factors:Hypertension,                 Dyslipidemia and Sleep Apnea.  Sonographer:    Raynelle Callow RDCS Referring Phys: [id number] Kiyla Ringler CALDWELL POWELL JR IMPRESSIONS  1. Left ventricular ejection fraction, by estimation, is 45%. Left ventricular ejection fraction by 2D MOD biplane is 42.3 %. The left ventricle has mildly decreased function. The left ventricle demonstrates global hypokinesis. There is moderate concentric left ventricular hypertrophy. Left ventricular diastolic parameters are consistent with Grade II diastolic dysfunction (pseudonormalization).  2. Right ventricular systolic function is mildly reduced. The right ventricular size is normal. There is normal pulmonary  artery systolic pressure. The estimated right ventricular systolic pressure is 12.0 mmHg.  3. Left atrial size was mild to moderately dilated.  4. The mitral valve is normal in structure. Trivial mitral valve regurgitation. No evidence of mitral stenosis.  5. The aortic valve is tricuspid. There is moderate calcification of the aortic valve. Aortic valve regurgitation is mild. Aortic valve sclerosis/calcification is present, without any evidence of aortic stenosis. Aortic valve mean gradient measures 7.0 mmHg.  6. The inferior vena cava is normal in size with greater than 50% respiratory variability, suggesting right atrial pressure of 3 mmHg. FINDINGS  Left Ventricle: Left ventricular ejection fraction, by estimation, is 45%. Left ventricular ejection fraction by 2D MOD biplane is 42.3 %. The left ventricle has mildly decreased function. The left ventricle demonstrates global hypokinesis. The left ventricular internal cavity size was normal in size. There is moderate concentric left ventricular hypertrophy. Left ventricular diastolic parameters are consistent with Grade II diastolic dysfunction (pseudonormalization). Right Ventricle: The right ventricular size is normal. No increase in right ventricular wall thickness. Right ventricular systolic function is mildly reduced. There is normal pulmonary artery systolic pressure. The tricuspid regurgitant velocity is 1.50 m/s, and with an assumed right atrial pressure of 3 mmHg, the estimated right ventricular systolic pressure is 12.0 mmHg. Left Atrium: Left atrial size was mild to moderately dilated. Right Atrium: Right atrial size was normal in size. Pericardium: There is no evidence of  pericardial effusion. Mitral Valve: The mitral valve is normal in structure. Mild mitral annular calcification. Trivial mitral valve regurgitation. No evidence of mitral valve stenosis. Tricuspid Valve: The tricuspid valve is normal in structure. Tricuspid valve regurgitation is  trivial. Aortic Valve: The aortic valve is tricuspid. There is moderate calcification of the aortic valve. Aortic valve regurgitation is mild. Aortic regurgitation PHT measures 394 msec. Aortic valve sclerosis/calcification is present, without any evidence of aortic stenosis. Aortic valve mean gradient measures 7.0 mmHg. Aortic valve peak gradient measures 11.9 mmHg. Aortic valve area, by VTI measures 1.67 cm. Pulmonic Valve: The pulmonic valve was normal in structure. Pulmonic valve regurgitation is trivial. Aorta: The aortic root is normal in size and structure. Venous: The inferior vena cava is normal in size with greater than 50% respiratory variability, suggesting right atrial pressure of 3 mmHg. IAS/Shunts: No atrial level shunt detected by color flow Doppler.  LEFT VENTRICLE PLAX 2D                        Biplane EF (MOD) LVIDd:         5.00 cm         LV Biplane EF:   Left LVIDs:         4.05 cm                          ventricular LV PW:         1.20 cm                          ejection LV IVS:        1.25 cm                          fraction by LVOT diam:     2.30 cm                          2D MOD LV SV:         60                               biplane is LV SV Index:   35                               42.3 %. LVOT Area:     4.15 cm                                Diastology                                LV e' medial:    6.72 cm/s LV Volumes (MOD)               LV E/e' medial:  7.5 LV vol d, MOD    112.0 ml      LV e' lateral:   7.15 cm/s A2C:                           LV E/e' lateral: 7.0 LV vol d, MOD  107.0 ml A4C: LV vol s, MOD    67.2 ml A2C: LV vol s, MOD    56.8 ml A4C: LV SV MOD A2C:   44.8 ml LV SV MOD A4C:   107.0 ml LV SV MOD BP:    46.3 ml RIGHT VENTRICLE            IVC RV S prime:     9.96 cm/s  IVC diam: 1.70 cm TAPSE (M-mode): 2.0 cm LEFT ATRIUM           Index        RIGHT ATRIUM           Index LA diam:      3.60 cm 2.14 cm/m   RA Area:     16.80 cm LA Vol (A2C): 77.9 ml 46.21 ml/m   RA Volume:   48.00 ml  28.48 ml/m LA Vol (A4C): 37.3 ml 22.13 ml/m  AORTIC VALVE                     PULMONIC VALVE AV Area (Vmax):    1.60 cm      PR End Diast Vel: 2.21 msec AV Area (Vmean):   1.53 cm AV Area (VTI):     1.67 cm AV Vmax:           172.75 cm/s AV Vmean:          115.000 cm/s AV VTI:            0.359 m AV Peak Grad:      11.9 mmHg AV Mean Grad:      7.0 mmHg LVOT Vmax:         66.40 cm/s LVOT Vmean:        42.400 cm/s LVOT VTI:          0.144 m LVOT/AV VTI ratio: 0.40 AI PHT:            394 msec  AORTA Ao Root diam: 3.90 cm Ao Asc diam:  3.40 cm MITRAL VALVE               TRICUSPID VALVE MV Area (PHT): 3.71 cm    TR Peak grad:   9.0 mmHg MV Decel Time: 205 msec    TR Vmax:        150.00 cm/s MV E velocity: 50.35 cm/s MV Azhane Eckart velocity: 37.05 cm/s  SHUNTS MV E/Joshuah Minella ratio:  1.36        Systemic VTI:  0.14 m                            Systemic Diam: 2.30 cm Dalton McleanMD Electronically signed by Archer Bear Signature Date/Time: 09/18/2023/8:57:05 AM    Final    CT HEAD WO CONTRAST ( ) Result Date: 09/16/2023 CLINICAL DATA:  Delirium EXAM: CT HEAD WITHOUT CONTRAST TECHNIQUE: Contiguous axial images were obtained from the base of the skull through the vertex without intravenous contrast. RADIATION DOSE REDUCTION: This exam was performed according to the departmental dose-optimization program which includes automated exposure control, adjustment of the mA and/or kV according to patient size and/or use of iterative reconstruction technique. COMPARISON:  CT head 08/18/2023 FINDINGS: Brain: Cerebral ventricle sizes are concordant with the degree of cerebral volume loss. Patchy and confluent areas of decreased attenuation are noted throughout the deep and periventricular white matter of the cerebral hemispheres bilaterally, compatible with chronic microvascular ischemic disease. No evidence of large-territorial acute infarction.  No parenchymal hemorrhage. No mass lesion. No extra-axial collection. No  mass effect or midline shift. No hydrocephalus. Basilar cisterns are patent. Vascular: No hyperdense vessel. Atherosclerotic calcifications are present within the cavernous internal carotid and vertebral arteries. Skull: No acute fracture or focal lesion. Sinuses/Orbits: Paranasal sinuses and mastoid air cells are clear. The orbits are unremarkable. Other: None. IMPRESSION: No acute intracranial abnormality. Electronically Signed   By: Morgane  Naveau M.D.   On: 09/16/2023 17:06   DG Chest 2 View Result Date: 09/16/2023 CLINICAL DATA:  Altered mental status. EXAM: CHEST - 2 VIEW COMPARISON:  August 18, 2023. FINDINGS: Stable cardiomediastinal silhouette. Both lungs are clear. The visualized skeletal structures are unremarkable. IMPRESSION: No active cardiopulmonary disease. Electronically Signed   By: Rosalene Colon M.D.   On: 09/16/2023 17:06    Microbiology: Recent Results (from the past 240 hours)  MRSA Next Gen by PCR, Nasal     Status: None   Collection Time: 09/16/23  9:37 PM   Specimen: Nasal Mucosa; Nasal Swab  Result Value Ref Range Status   MRSA by PCR Next Gen NOT DETECTED NOT DETECTED Final    Comment: (NOTE) The GeneXpert MRSA Assay (FDA approved for NASAL specimens only), is one component of [facility] comprehensive MRSA colonization surveillance program. It is not intended to diagnose MRSA infection nor to guide or monitor treatment for MRSA infections. Test performance is not FDA approved in patients less than 62 years old. Performed at [facility], [address] [place], [place] [address]      Labs: Basic Metabolic Panel: Recent Labs  Lab 09/16/23 1353 09/17/23 0418 09/18/23 0412 09/19/23 0400  NA 138 141 140 138  K 4.7 4.1 3.8 4.0  CL 110 108 108 106  CO2 23 25 24 26   GLUCOSE 105* 88 85 87  BUN 25* 24* 18 20  CREATININE 1.53* 1.21 1.21 1.20  CALCIUM  8.4* 9.3 8.9 9.2  MG  --   --  1.9 1.7  PHOS  --   --  3.1 3.3   Liver Function Tests: Recent  Labs  Lab 09/16/23 1353 09/18/23 0412 09/19/23 0400  AST 28 16 17   ALT 12 12 14   ALKPHOS 24* 26* 27*  BILITOT 1.1 0.6 0.4  PROT 6.0* 5.7* 6.4*  ALBUMIN 2.9* 2.7* 3.1*   Recent Labs  Lab 09/16/23 1621  LIPASE 38   Recent Labs  Lab 09/16/23 2130  AMMONIA <13   CBC: Recent Labs  Lab 09/16/23 1353 09/17/23 0418 09/18/23 0412 09/19/23 0400  WBC 9.2 9.2 7.1 8.2  NEUTROABS 7.0  --  3.8 4.6  HGB 10.6* 10.6* 10.7* 11.4*  HCT 34.2* 34.7* 34.8* 38.2*  MCV 87.5 87.0 86.8 88.6  PLT 202 210 201 220   Cardiac Enzymes: No results for input(s): "CKTOTAL", "CKMB", "CKMBINDEX", "TROPONINI" in the last 168 hours. BNP: BNP (last 3 results) Recent Labs    03/01/23 0245 03/03/23 0313 06/22/23 1251  BNP 1,535.3* 489.2* 946.6*    ProBNP (last 3 results) No results for input(s): "PROBNP" in the last 8760 hours.  CBG: Recent Labs  Lab 09/16/23 1935  GLUCAP 91       Signed:  Donnetta Gains MD.  [facility] 09/19/2023, 10:43 AM

## 2023-09-19 NOTE — [place] FL2 (Addendum)
 [place]  MEDICAID FL2 LEVEL OF CARE FORM     IDENTIFICATION  Patient Name: Dennis [name] Birthdate: [birth year] Sex: male Admission Date (Current Location): 09/16/2023  [facility] and [place] Number:  Producer, television/film/video and Address:  [facility],  [address], [place] [address]      Provider Number: [id number]  Attending Physician Name and Address:  Etter Hermann., *  Relative Name and Phone Number:  Kathaleen Pale (Legal Guardian)  [phone] (Mobile)    Current Level of Care: Hospital Recommended Level of Care: Memory Care Prior Approval Number:    Date Approved/Denied:   PASRR Number:    Discharge Plan: Other (Comment) (Memory Care)    Current Diagnoses: Patient Active Problem List   Diagnosis Date Noted   Syncope 09/16/2023   AKI (acute kidney injury) (HCC) 09/16/2023   UTI (urinary tract infection) 09/16/2023   Acute metabolic encephalopathy 09/16/2023   History of stroke 09/16/2023   Agitation 07/10/2023   Dysphagia 07/02/2023   Dementia with behavioral disturbance (HCC) 07/02/2023   Acute CVA (cerebrovascular accident) (HCC) 06/21/2023   BPH (benign prostatic hyperplasia) 06/21/2023   Altered mental state 02/22/2023   Difficulty walking 02/22/2023   HLD (hyperlipidemia) 06/02/2009   HTN (hypertension) 06/02/2009   AORTIC INSUFFICIENCY, MILD 06/02/2009   DYSPNEA ON EXERTION 06/02/2009   CHEST PAIN, ATYPICAL 06/02/2009    Orientation RESPIRATION BLADDER Height & Weight     Self  Normal Continent Weight: 140 lb 14 oz (63.9 kg) Height:  5\' 4"  (162.6 cm)  BEHAVIORAL SYMPTOMS/MOOD NEUROLOGICAL BOWEL NUTRITION STATUS      Continent Diet: No added table salt    AMBULATORY STATUS COMMUNICATION OF NEEDS Skin   Independent Verbally Normal                       Personal Care Assistance Level of Assistance  Bathing, Feeding, Dressing Bathing Assistance: Limited assistance Feeding assistance: Independent Dressing Assistance:  Limited assistance     Functional Limitations Info  Sight, Hearing, Speech Sight Info: Impaired (glasses) Hearing Info: Impaired (hard of hearing) Speech Info: Adequate    SPECIAL CARE FACTORS FREQUENCY                       Contractures Contractures Info: Not present    Additional Factors Info  Code Status, Allergies Code Status Info: Full Allergies Info: Codeine, Shellfish Allergy           Current Medications (09/19/2023):  This is the current hospital active medication list Current Facility-Administered Medications  Medication Dose Route Frequency Provider Last Rate Last Admin   acetaminophen  (TYLENOL ) tablet 650 mg  650 mg Oral Q6H PRN Rathore, Vasundhra, MD   650 mg at 09/17/23 2057   Or   acetaminophen  (TYLENOL ) suppository 650 mg  650 mg Rectal Q6H PRN Juliette Oh, MD       aspirin  EC tablet 81 mg  81 mg Oral Daily Etter Hermann., MD   81 mg at 09/19/23 0910   atorvastatin  (LIPITOR ) tablet 40 mg  40 mg Oral Daily Etter Hermann., MD   40 mg at 09/19/23 5621   carvedilol  (COREG ) tablet 3.125 mg  3.125 mg Oral BID WC Etter Hermann., MD   3.125 mg at 09/19/23 0910   cefTRIAXone  (ROCEPHIN ) 1 g in sodium chloride  0.9 % 100 mL IVPB  1 g Intravenous Q24H Rathore, Vasundhra, MD 200 mL/hr at 09/18/23 2227 1 g  at 09/18/23 2227   docusate sodium  (COLACE) capsule 100 mg  100 mg Oral BID Etter Hermann., MD   100 mg at 09/18/23 2143   enoxaparin  (LOVENOX ) injection 40 mg  40 mg Subcutaneous Q24H Etter Hermann., MD   40 mg at 09/18/23 2143   finasteride  (PROSCAR ) tablet 5 mg  5 mg Oral Daily Etter Hermann., MD   5 mg at 09/19/23 1914   FLUoxetine  (PROZAC ) capsule 20 mg  20 mg Oral Daily Etter Hermann., MD   20 mg at 09/19/23 7829   folic acid  (FOLVITE ) tablet 1 mg  1 mg Oral Daily Etter Hermann., MD   1 mg at 09/19/23 5621   Oral care mouth rinse  15 mL Mouth Rinse 4 times per day Etter Hermann., MD    15 mL at 09/19/23 0913   pantoprazole  (PROTONIX ) EC tablet 40 mg  40 mg Oral Daily Etter Hermann., MD   40 mg at 09/19/23 0910   polyethylene glycol (MIRALAX  / GLYCOLAX ) packet 17 g  17 g Oral Daily PRN Etter Hermann., MD       QUEtiapine  (SEROQUEL ) tablet 25 mg  25 mg Oral Daily Etter Hermann., MD   25 mg at 09/19/23 3086   QUEtiapine  (SEROQUEL ) tablet 50 mg  50 mg Oral q1800 Etter Hermann., MD   50 mg at 09/18/23 1758   senna-docusate (Senokot-S) tablet 1 tablet  1 tablet Oral QHS PRN Etter Hermann., MD   1 tablet at 09/17/23 2058   tamsulosin  (FLOMAX ) capsule 0.4 mg  0.4 mg Oral Daily Etter Hermann., MD   0.4 mg at 09/19/23 0910     Discharge Medications: STOP taking these medications     apixaban  5 MG Tabs tablet Commonly known as: ELIQUIS     hydrALAZINE  25 MG tablet Commonly known as: APRESOLINE     isosorbide  mononitrate 30 MG 24 hr tablet Commonly known as: IMDUR            TAKE these medications     aspirin  EC 81 MG tablet Take 1 tablet (81 mg total) by mouth daily. Swallow whole. Start taking on: Sep 20, 2023    atorvastatin  40 MG tablet Commonly known as: LIPITOR  Take 1 tablet (40 mg total) by mouth daily.    carvedilol  3.125 MG tablet Commonly known as: COREG  Take 1 tablet (3.125 mg total) by mouth 2 (two) times daily with a meal.    docusate sodium  100 MG capsule Commonly known as: COLACE Take 1 capsule (100 mg total) by mouth 2 (two) times daily.    finasteride  5 MG tablet Commonly known as: PROSCAR  Take 1 tablet (5 mg total) by mouth daily.    FLUoxetine  20 MG capsule Commonly known as: PROZAC  Take 20 mg by mouth daily.    folic acid  1 MG tablet Commonly known as: FOLVITE  Take 1 tablet (1 mg total) by mouth daily.    pantoprazole  40 MG tablet Commonly known as: PROTONIX  Take 1 tablet (40 mg total) by mouth daily.    polyethylene glycol 17 g packet Commonly known as: MIRALAX  / GLYCOLAX  Take 17 g by  mouth daily as needed.    QUEtiapine  50 MG tablet Commonly known as: SEROQUEL  Take 1 tablet (50 mg total) by mouth daily at 6 PM.    QUEtiapine  25 MG tablet Commonly known as: SEROQUEL  Take 1 tablet (25 mg total) by mouth  every morning.    senna-docusate 8.6-50 MG tablet Commonly known as: Senokot-S Take 1 tablet by mouth at bedtime as needed for mild constipation.    tamsulosin  0.4 MG Caps capsule Commonly known as: FLOMAX  Take 1 capsule (0.4 mg total) by mouth daily.      Relevant Imaging Results:  Relevant Lab Results:   Additional Information SSN:[id number]  Arta Lark Sarann Tregre, LCSW

## 2023-09-30 NOTE — Progress Notes (Deleted)
 [facility] [address] [place]. [place] [address] [phone]       HOSPITAL FOLLOW UP NOTE  [name] Date of Birth:  27-Mar-1946 Medical Record Number:  [id number]   Reason for Referral:  hospital stroke follow up    SUBJECTIVE:   CHIEF COMPLAINT:  No chief complaint on file.   HPI:   [name] is a 78 y.o. 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  LE Doppler negative for DVT.  LDL 105.  A1c 5.5.  Evaluated by cardiology, placed on Eliquis  until EF greater than 40 to 35% and recommended cardiac monitor to assess for A-fib, also increased home dose atorvastatin  from 40 mg to 80 mg daily.  Noted underlying dementia, psychiatry placed on fluoxetine  and Seroquel  for inappropriate behaviors.  Prolonged hospital stay due to awaiting guardianship of social services.  He was eventually discharged to SNF memory unit on 3/27 (54 day stay).   He returned to ED on 3/31 with altered mental status and difficulty with ambulation. CTH no acute intracranial abnormality.  Lab work overall reassuring.  Released back to facility after 6 hours.  Return again to ED on 4/29 for 3 days after episode of presyncope/AMS likely in setting of hypotension.  Blood pressure improved after IV fluids.  We have also treated for possible UTI. CTH no acute findings.  Plan to gradually resume home antihypertensives.  Recommended consideration of EEG with any recurrent events (staff reported patient dropped cup at breakfast and became less alert, staring/dazed, not acting like himself, full episode lasting 1 minute).   Repeat echo showed EF of 45% therefore Eliquis  discontinued and initiated aspirin  and advised outpatient cardiology follow-up.         PERTINENT IMAGING  Code Stroke CT head No acute abnormality.  MRI  Punctate acute infarct in the left posterior subinsular white matter.  Left frontal linear cortical infarct, subacute right frontal punctate infarct. 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 2D Echo: LVEF 20 to 25%, global hypokinesis, mild MVR, mild AVR, moderate aortic valve thickening, moderate aortic valve calcification LE venous Doppler no DVT LDL 105 HgbA1c 5.5    ROS:   14 system review of systems performed and negative with exception of ***  PMH:  Past Medical History:  Diagnosis Date   Arthritis    Hypertension     PSH:  Past Surgical History:  Procedure Laterality Date   CHOLECYSTECTOMY     THROAT SURGERY      Social History:  Social History   Socioeconomic History   Marital status: Single    Spouse name: Not on file   Number of children: Not on file   Years of education: Not on file   Highest education level: Not on file  Occupational History   Not on file  Tobacco Use   Smoking status: Former    Types: Cigarettes   Smokeless tobacco: Never  Vaping Use   Vaping status: Never Used  Substance and Sexual Activity   Alcohol  use: No   Drug use: No  Sexual activity: Not on file  Other Topics Concern   Not on file  Social History Narrative   Not on file   Social Drivers of Health   Financial Resource Strain: Not on file  Food Insecurity: No Food Insecurity (09/17/2023)   Hunger Vital Sign    Worried About Running Out of Food in the Last Year: Never true    Ran Out of Food in the Last Year: Never true  Transportation Needs: No Transportation Needs (09/17/2023)   PRAPARE - Administrator, Civil Service (Medical): No     Lack of Transportation (Non-Medical): No  Physical Activity: Not on file  Stress: Not on file  Social Connections: Moderately Isolated (09/17/2023)   Social Connection and Isolation Panel [NHANES]    Frequency of Communication with Friends and Family: More than three times a week    Frequency of Social Gatherings with Friends and Family: More than three times a week    Attends Religious Services: More than 4 times per year    Active Member of [employer] or Organizations: No    Attends Banker Meetings: Never    Marital Status: Divorced  Catering manager Violence: Not At Risk (09/17/2023)   Humiliation, Afraid, Rape, and Kick questionnaire    Fear of Current or Ex-Partner: No    Emotionally Abused: No    Physically Abused: No    Sexually Abused: No    Family History: No family history on file.  Medications:   Current Outpatient Medications on File Prior to Visit  Medication Sig Dispense Refill   aspirin  EC 81 MG tablet Take 1 tablet (81 mg total) by mouth daily. Swallow whole.     atorvastatin  (LIPITOR ) 40 MG tablet Take 1 tablet (40 mg total) by mouth daily. 30 tablet 2   carvedilol  (COREG ) 3.125 MG tablet Take 1 tablet (3.125 mg total) by mouth 2 (two) times daily with a meal. 60 tablet 2   docusate sodium  (COLACE) 100 MG capsule Take 1 capsule (100 mg total) by mouth 2 (two) times daily. 10 capsule 0   finasteride  (PROSCAR ) 5 MG tablet Take 1 tablet (5 mg total) by mouth daily. 30 tablet 2   FLUoxetine  (PROZAC ) 20 MG capsule Take 20 mg by mouth daily.     folic acid  (FOLVITE ) 1 MG tablet Take 1 tablet (1 mg total) by mouth daily. 30 tablet 2   pantoprazole  (PROTONIX ) 40 MG tablet Take 1 tablet (40 mg total) by mouth daily. 90 tablet 1   polyethylene glycol (MIRALAX  / GLYCOLAX ) 17 g packet Take 17 g by mouth daily as needed. 14 each 0   QUEtiapine  (SEROQUEL ) 25 MG tablet Take 1 tablet (25 mg total) by mouth every morning. 90 tablet 0   QUEtiapine  (SEROQUEL ) 50 MG tablet Take  1 tablet (50 mg total) by mouth daily at 6 PM. 90 tablet 0   senna-docusate (SENOKOT-S) 8.6-50 MG tablet Take 1 tablet by mouth at bedtime as needed for mild constipation. 15 tablet 0   tamsulosin  (FLOMAX ) 0.4 MG CAPS capsule Take 1 capsule (0.4 mg total) by mouth daily. 30 capsule 2   No current facility-administered medications on file prior to visit.    Allergies:   Allergies  Allergen Reactions   Codeine    Shellfish Allergy       OBJECTIVE:  Physical Exam  There were no vitals filed for this visit. There is no height or weight on file to calculate BMI. No results found.  General: well developed, well nourished, seated, in no evident distress Head: head normocephalic and atraumatic.   Neck: supple with no carotid or supraclavicular bruits Cardiovascular: regular rate and rhythm, no murmurs Musculoskeletal: no deformity Skin:  no rash/petichiae Vascular:  Normal pulses all extremities   Neurologic Exam Mental Status: Awake and fully alert. Oriented to place and time. Recent and remote memory intact. Attention span, concentration and fund of knowledge appropriate. Mood and affect appropriate.  Cranial Nerves: Fundoscopic exam reveals sharp disc margins. Pupils equal, briskly reactive to light. Extraocular movements full without nystagmus. Visual fields full to confrontation. Hearing intact. Facial sensation intact. Face, tongue, palate moves normally and symmetrically.  Motor: Normal bulk and tone. Normal strength in all tested extremity muscles Sensory.: intact to touch , pinprick , position and vibratory sensation.  Coordination: Rapid alternating movements normal in all extremities. Finger-to-nose and heel-to-shin performed accurately bilaterally. Gait and Station: Arises from chair without difficulty. Stance is normal. Gait demonstrates normal stride length and balance with ***. Tandem walk and heel toe ***.  Reflexes: 1+ and symmetric. Toes downgoing.     NIHSS   *** Modified Rankin  ***      ASSESSMENT: Dennis [name] is a 78 y.o. year old male with left frontal linear and left insular punctate acute infarcts and right frontal punctate subacute infarct in 06/2023 likely cardioembolic from cardiomyopathy with low EF. Vascular risk factors include cardiomyopathy, carotid stenosis and R VA stenosis, HTN, HLD and suspected advanced dementia.      PLAN:  Embolic strokes:  Residual deficit: ***.  Continue Eliquis  and atorvastatin  (Lipitor ) for secondary stroke prevention managed/prescribed by PCP.   Discussed secondary stroke prevention measures and importance of close PCP follow up for aggressive stroke risk factor management including BP goal<130/90, HLD with LDL goal<70 and DM with A1c.<7 .  Stroke labs with 06/2023: LDL 105, A1c 5.5 I have gone over the pathophysiology of stroke, warning signs and symptoms, risk factors and their management in some detail with instructions to go to the closest emergency room for symptoms of concern. Cardiomyopathy: EF 20 to 25% 06/2023 EF >45% 09/2023 Recommend continuation of Eliquis  until EF >30-35%    Follow up in *** or call earlier if needed   CC:  [facility] provider: Dr. Janett Medin PCP: Pcp, No    I spent *** minutes of face-to-face and non-face-to-face time with patient.  This included previsit chart review including review of recent hospitalization, lab review, study review, order entry, electronic health record documentation, patient education regarding recent stroke including etiology, secondary stroke prevention measures and importance of managing stroke risk factors, residual deficits and typical recovery time and answered all other questions to patient satisfaction   Johny Nap, AGNP-BC  [facility] [address] [place], [place] [address]  Phone [phone] Fax [phone] Note: This document was prepared with digital dictation and possible smart phrase  technology. Any transcriptional errors that result from this process are unintentional.

## 2023-10-01 ENCOUNTER — Inpatient Hospital Stay: Admitting: Adult Health

## 2023-10-08 ENCOUNTER — Encounter (HOSPITAL_COMMUNITY): Payer: Self-pay

## 2023-10-08 ENCOUNTER — Other Ambulatory Visit: Payer: Self-pay

## 2023-10-08 ENCOUNTER — Emergency Department (HOSPITAL_COMMUNITY)

## 2023-10-08 ENCOUNTER — Emergency Department (HOSPITAL_COMMUNITY)
Admission: EM | Admit: 2023-10-08 | Discharge: 2023-10-08 | Disposition: A | Discharge status: Skilled Nursing Facility | Attending: Emergency Medicine | Admitting: Emergency Medicine

## 2023-10-08 DIAGNOSIS — F03918 Unspecified dementia, unspecified severity, with other behavioral disturbance: Secondary | ICD-10-CM | POA: Insufficient documentation

## 2023-10-08 DIAGNOSIS — R0789 Other chest pain: Secondary | ICD-10-CM | POA: Diagnosis present

## 2023-10-08 DIAGNOSIS — Z8673 Personal history of transient ischemic attack (TIA), and cerebral infarction without residual deficits: Secondary | ICD-10-CM | POA: Insufficient documentation

## 2023-10-08 DIAGNOSIS — Z87891 Personal history of nicotine dependence: Secondary | ICD-10-CM | POA: Insufficient documentation

## 2023-10-08 DIAGNOSIS — Z7982 Long term (current) use of aspirin: Secondary | ICD-10-CM | POA: Diagnosis not present

## 2023-10-08 DIAGNOSIS — R079 Chest pain, unspecified: Secondary | ICD-10-CM

## 2023-10-08 DIAGNOSIS — I10 Essential (primary) hypertension: Secondary | ICD-10-CM | POA: Insufficient documentation

## 2023-10-08 LAB — CBC WITH DIFFERENTIAL/PLATELET
Abs Immature Granulocytes: 0.02 10*3/uL (ref 0.00–0.07)
Basophils Absolute: 0 10*3/uL (ref 0.0–0.1)
Basophils Relative: 0 %
Eosinophils Absolute: 0.4 10*3/uL (ref 0.0–0.5)
Eosinophils Relative: 5 %
HCT: 37.6 % — ABNORMAL LOW (ref 39.0–52.0)
Hemoglobin: 11.6 g/dL — ABNORMAL LOW (ref 13.0–17.0)
Immature Granulocytes: 0 %
Lymphocytes Relative: 36 %
Lymphs Abs: 2.8 10*3/uL (ref 0.7–4.0)
MCH: 27 pg (ref 26.0–34.0)
MCHC: 30.9 g/dL (ref 30.0–36.0)
MCV: 87.4 fL (ref 80.0–100.0)
Monocytes Absolute: 0.6 10*3/uL (ref 0.1–1.0)
Monocytes Relative: 8 %
Neutro Abs: 4 10*3/uL (ref 1.7–7.7)
Neutrophils Relative %: 51 %
Platelets: 228 10*3/uL (ref 150–400)
RBC: 4.3 MIL/uL (ref 4.22–5.81)
RDW: 15.7 % — ABNORMAL HIGH (ref 11.5–15.5)
WBC: 7.8 10*3/uL (ref 4.0–10.5)
nRBC: 0 % (ref 0.0–0.2)

## 2023-10-08 LAB — BASIC METABOLIC PANEL WITH GFR
Anion gap: 10 (ref 5–15)
BUN: 21 mg/dL (ref 8–23)
CO2: 24 mmol/L (ref 22–32)
Calcium: 9.6 mg/dL (ref 8.9–10.3)
Chloride: 105 mmol/L (ref 98–111)
Creatinine, Ser: 1.31 mg/dL — ABNORMAL HIGH (ref 0.61–1.24)
GFR, Estimated: 55 mL/min — ABNORMAL LOW (ref >60)
Glucose, Bld: 143 mg/dL — ABNORMAL HIGH (ref 70–99)
Potassium: 4 mmol/L (ref 3.5–5.1)
Sodium: 139 mmol/L (ref 135–145)

## 2023-10-08 LAB — TROPONIN I (HIGH SENSITIVITY)
Troponin I (High Sensitivity): 35 ng/L — ABNORMAL HIGH (ref <18)
Troponin I (High Sensitivity): 31 ng/L — ABNORMAL HIGH (ref <18)

## 2023-10-08 NOTE — Discharge Instructions (Addendum)
 Your workup tonight was reassuring.  Follow-up with your primary care provider as needed.  If you develop any life-threatening symptoms return to the emergency department.

## 2023-10-08 NOTE — ED Triage Notes (Signed)
 Pt arrived from [facility] via [facility] x 15 min prior to EMS arrival. Chest pain resolved once EMS got arrived. Left substernal, pt unable describe how it feels.

## 2023-10-08 NOTE — ED Notes (Signed)
 PTAR ride set up by Diplomatic Services operational officer.

## 2023-10-08 NOTE — ED Provider Notes (Signed)
 [place] EMERGENCY DEPARTMENT AT [facility] Provider Note   CSN: [id number] Arrival date & time: 10/08/23  0231     History  Chief Complaint  Patient presents with   Chest Pain    Dennis [name] is a 78 y.o. male.  Patient with past medical history significant for dementia with behavioral disturbance, hypertension, atypical chest pain, stroke presents to the emergency department via EMS due to reported chest pain.  Patient from [facility].  Patient reportedly complained of some chest pain that occurred 15 minutes prior to EMSs arrival on scene.  Chest pain resolved by the time EMS had arrived.  Patient currently has no complaints.  He states he is sleepy at this time.  He is in no acute distress upon arrival.  He is reportedly acting at baseline according to EMS.  He is unable to tell me how the chest pain felt when occurring.    Chest Pain      Home Medications Prior to Admission medications   Medication Sig Start Date End Date Taking? Authorizing Provider  aspirin  EC 81 MG tablet Take 1 tablet (81 mg total) by mouth daily. Swallow whole. 09/20/23   Etter Hermann., MD  atorvastatin  (LIPITOR ) 40 MG tablet Take 1 tablet (40 mg total) by mouth daily. 03/16/23   Ghimire, Estil Heman, MD  carvedilol  (COREG ) 3.125 MG tablet Take 1 tablet (3.125 mg total) by mouth 2 (two) times daily with a meal. 03/16/23   Ghimire, Estil Heman, MD  docusate sodium  (COLACE) 100 MG capsule Take 1 capsule (100 mg total) by mouth 2 (two) times daily. 08/05/23   Aisha Hove, MD  finasteride  (PROSCAR ) 5 MG tablet Take 1 tablet (5 mg total) by mouth daily. 03/16/23   Ghimire, Estil Heman, MD  FLUoxetine  (PROZAC ) 20 MG capsule Take 20 mg by mouth daily.    [provider]  folic acid  (FOLVITE ) 1 MG tablet Take 1 tablet (1 mg total) by mouth daily. 03/16/23   Ghimire, Estil Heman, MD  pantoprazole  (PROTONIX ) 40 MG tablet Take 1 tablet (40 mg total) by mouth daily. 08/06/23    Aisha Hove, MD  polyethylene glycol (MIRALAX  / GLYCOLAX ) 17 g packet Take 17 g by mouth daily as needed. 08/05/23   Aisha Hove, MD  QUEtiapine  (SEROQUEL ) 25 MG tablet Take 1 tablet (25 mg total) by mouth every morning. 08/06/23   Aisha Hove, MD  QUEtiapine  (SEROQUEL ) 50 MG tablet Take 1 tablet (50 mg total) by mouth daily at 6 PM. 08/05/23   Aisha Hove, MD  senna-docusate (SENOKOT-S) 8.6-50 MG tablet Take 1 tablet by mouth at bedtime as needed for mild constipation. 08/05/23   Aisha Hove, MD  tamsulosin  (FLOMAX ) 0.4 MG CAPS capsule Take 1 capsule (0.4 mg total) by mouth daily. 03/16/23   Ghimire, Estil Heman, MD      Allergies    Codeine and Shellfish allergy    Review of Systems   Review of Systems  Cardiovascular:  Positive for chest pain.    Physical Exam Updated Vital Signs BP (!) 142/60   Pulse (!) 59   Temp 99 F (37.2 C)   Resp 13   Ht 5\' 4"  (1.626 m)   Wt 63.9 kg   SpO2 97%   BMI 24.18 kg/m  Physical Exam Vitals and nursing note reviewed.  Constitutional:      General: He is not in acute distress.    Appearance: He is well-developed.  HENT:  Head: Normocephalic and atraumatic.  Eyes:     Conjunctiva/sclera: Conjunctivae normal.  Cardiovascular:     Rate and Rhythm: Normal rate and regular rhythm.  Pulmonary:     Effort: Pulmonary effort is normal. No respiratory distress.     Breath sounds: Normal breath sounds.  Chest:     Chest wall: No tenderness.  Abdominal:     Palpations: Abdomen is soft.     Tenderness: There is no abdominal tenderness.  Musculoskeletal:        General: No swelling.     Cervical back: Neck supple.  Skin:    General: Skin is warm and dry.     Capillary Refill: Capillary refill takes less than 2 seconds.  Neurological:     Mental Status: He is alert.     Comments: At reported baseline  Psychiatric:        Mood and Affect: Mood normal.     ED Results / Procedures / Treatments    Labs (all labs ordered are listed, but only abnormal results are displayed) Labs Reviewed  BASIC METABOLIC PANEL WITH GFR - Abnormal; Notable for the following components:      Result Value   Glucose, Bld 143 (*)    Creatinine, Ser 1.31 (*)    GFR, Estimated 55 (*)    All other components within normal limits  CBC WITH DIFFERENTIAL/PLATELET - Abnormal; Notable for the following components:   Hemoglobin 11.6 (*)    HCT 37.6 (*)    RDW 15.7 (*)    All other components within normal limits  TROPONIN I (HIGH SENSITIVITY) - Abnormal; Notable for the following components:   Troponin I (High Sensitivity) 35 (*)    All other components within normal limits  TROPONIN I (HIGH SENSITIVITY) - Abnormal; Notable for the following components:   Troponin I (High Sensitivity) 31 (*)    All other components within normal limits    EKG EKG Interpretation Date/Time:  Wednesday Oct 08 2023 02:41:05 EDT Ventricular Rate:  67 PR Interval:  169 QRS Duration:  86 QT Interval:  384 QTC Calculation: 406 R Axis:   41  Text Interpretation: Sinus rhythm Nonspecific repol abnormality, diffuse leads Minimal ST elevation, anterior leads When compared with ECG of 09/16/2023, No significant change was found Confirmed by Alissa April ([id number]) on 10/08/2023 2:45:01 AM  Radiology DG Chest Port 1 View Result Date: 10/08/2023 CLINICAL DATA:  Chest pain EXAM: PORTABLE CHEST 1 VIEW COMPARISON:  09/16/2023 FINDINGS: The heart size and mediastinal contours are within normal limits. Both lungs are clear. The visualized skeletal structures are unremarkable. IMPRESSION: No active disease. Electronically Signed   By: Violeta Grey M.D.   On: 10/08/2023 03:18    Procedures Procedures    Medications Ordered in ED Medications - No data to display  ED Course/ Medical Decision Making/ A&P                                 Medical Decision Making Amount and/or Complexity of Data Reviewed Labs: ordered. Radiology:  ordered.   This patient presents to the ED for concern of chest pain, this involves an extensive number of treatment options, and is a complaint that carries with it a high risk of complications and morbidity.  The differential diagnosis includes ACS, musculoskeletal pain, pneumonia, anxiety, others   Co morbidities that complicate the patient evaluation  Dementia   Additional history obtained:  Additional history  obtained from EMS External records from outside source obtained and reviewed including recent discharge summary   Lab Tests:  I Ordered, and personally interpreted labs.  The pertinent results include: Grossly unremarkable CBC, BMP.  Troponin 35, repeat troponin 31   Imaging Studies ordered:  I ordered imaging studies including chest x-ray I independently visualized and interpreted imaging which showed no active disease I agree with the radiologist interpretation   Cardiac Monitoring: / EKG:  The patient was maintained on a cardiac monitor.  I personally viewed and interpreted the cardiac monitored which showed an underlying rhythm of: Sinus rhythm   Social Determinants of Health:  Patient is a former smoker   Test / Admission - Considered:  Patient currently asymptomatic.  Troponins flat, patient with chronically elevated troponins.  Chest x-ray unremarkable.  I currently see no emergent condition causing patient's earlier episode of chest pain.  He has had no further episodes.  Patient stable for discharge back to his facility at this time.         Final Clinical Impression(s) / ED Diagnoses Final diagnoses:  Chest pain, unspecified type    Rx / DC Orders ED Discharge Orders     None         Delories Fetter 10/08/23 [id number]    Alissa April, MD 10/08/23 [phone]

## 2023-10-21 ENCOUNTER — Ambulatory Visit (INDEPENDENT_AMBULATORY_CARE_PROVIDER_SITE_OTHER): Admitting: Adult Health

## 2023-10-21 ENCOUNTER — Encounter: Payer: Self-pay | Admitting: Adult Health

## 2023-10-21 VITALS — BP 137/65 | HR 57 | Ht 65.0 in | Wt 134.0 lb

## 2023-10-21 DIAGNOSIS — Z09 Encounter for follow-up examination after completed treatment for conditions other than malignant neoplasm: Secondary | ICD-10-CM | POA: Diagnosis not present

## 2023-10-21 DIAGNOSIS — I6340 Cerebral infarction due to embolism of unspecified cerebral artery: Secondary | ICD-10-CM

## 2023-10-21 NOTE — Progress Notes (Signed)
 [facility] [address] [place]. [place] [address] [phone]       HOSPITAL FOLLOW UP NOTE  [name] Date of Birth:  [birth year] Medical Record Number:  [id number]   Reason for Referral:  hospital stroke follow up    SUBJECTIVE:   CHIEF COMPLAINT:  Chief Complaint  Patient presents with   Cerebrovascular Accident    Rm 8 with facility staff Pt is well, staff reports he has some speech slurring but it was present when he came to facility. No other concerns.     HPI:   [name] is a 78 y.o. 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  LE Doppler negative for DVT.  LDL 105.  A1c 5.5.  Evaluated by cardiology, placed on Eliquis  until EF greater than 40 to 35% and recommended cardiac monitor to assess for A-fib, also increased home dose atorvastatin  from 40 mg to 80 mg daily.  Noted underlying dementia, psychiatry placed on fluoxetine  and Seroquel  for inappropriate behaviors.  Prolonged hospital stay due to awaiting guardianship of social services.  He was eventually discharged to SNF memory unit on 3/27 (54 day stay).   He returned to ED on 3/31 with altered mental status and difficulty with ambulation. CTH no acute intracranial abnormality.  Lab work overall reassuring.  Released back to facility after 6 hours.  Return again to ED on 4/29 for 3 days after episode of presyncope/AMS likely in setting of hypotension.  Blood pressure improved after IV fluids.  Also treated for possible UTI. CTH no acute findings.  Plan to gradually resume home antihypertensives.  Recommended consideration  of EEG with any recurrent events (staff reported patient dropped cup at breakfast and became less alert, staring/dazed, not acting like himself, full episode lasting 1 minute).  Repeat echo showed EF of 45% therefore Eliquis  discontinued and initiated aspirin  and advised outpatient cardiology follow-up.    Today, 10/21/2023, patient is being seen for hospital follow up accompanied by SNF staff who provides most history as patient poor historian.  Continues to reside at [facility] memory care under guardianship. No new stroke/TIA symptoms. Continued cognitive impairment which can fluctuate with episodes of confusion and sometimes speech difficulty. He remains on Seroquel  and Prozac , mood overall stable. He does endorse frustration regarding not being able to remember certain things. 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 He is able to feed himself but needs assistance with all other ADLs.  Ambulates without AD, no recent falls.  Usually continent of both bladder and bowel.  Remains on both aspirin  and atorvastatin  without side effects.  Has not yet had follow-up with cardiology although updated referral placed on 5/19 to heart care at [facility] street but per staff, has not yet been called to scheduled. No questions or concerns at this time.      PERTINENT IMAGING  Code Stroke CT head No acute abnormality.  MRI  Punctate acute infarct in the left posterior subinsular white matter.  Left frontal linear cortical infarct, subacute right frontal punctate infarct. 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, 50% stenosis of left proximal ICA, severe  right vertebral artery stenosis and aortic arthrosclerosis 2D Echo 06/21/2023: LVEF 20 to 25%, global hypokinesis, mild MVR, mild AVR, moderate aortic valve thickening, moderate aortic valve calcification 2D echo 09/18/2023: EF 45% LE venous Doppler no DVT LDL 105 HgbA1c 5.5    ROS:   14 system review of systems performed and  negative with exception of those listed in HPI  PMH:  Past Medical History:  Diagnosis Date   Arthritis    Hypertension     PSH:  Past Surgical History:  Procedure Laterality Date   CHOLECYSTECTOMY     THROAT SURGERY      Social History:  Social History   Socioeconomic History   Marital status: Single    Spouse name: Not on file   Number of children: Not on file   Years of education: Not on file   Highest education level: Not on file  Occupational History   Not on file  Tobacco Use   Smoking status: Former    Types: Cigarettes   Smokeless tobacco: Never  Vaping Use   Vaping status: Never Used  Substance and Sexual Activity   Alcohol  use: No   Drug use: No   Sexual activity: Not on file  Other Topics Concern   Not on file  Social History Narrative   Not on file   Social Drivers of Health   Financial Resource Strain: Not on file  Food Insecurity: No Food Insecurity (09/17/2023)   Hunger Vital Sign    Worried About Running Out of Food in the Last Year: Never true    Ran Out of Food in the Last Year: Never true  Transportation Needs: No Transportation Needs (09/17/2023)   PRAPARE - Administrator, Civil Service (Medical): No    Lack of Transportation (Non-Medical): No  Physical Activity: Not on file  Stress: Not on file  Social Connections: Moderately Isolated (09/17/2023)   Social Connection and Isolation Panel [NHANES]    Frequency of Communication with Friends and Family: More than three times a week    Frequency of Social Gatherings with Friends and Family: More than three times a week    Attends Religious Services: More than 4 times per year    Active Member of [employer] or Organizations: No    Attends Banker Meetings: Never    Marital Status: Divorced  Catering manager Violence: Not At Risk (09/17/2023)   Humiliation, Afraid, Rape, and Kick questionnaire    Fear of Current or Ex-Partner: No    Emotionally Abused: No    Physically  Abused: No    Sexually Abused: No    Family History: History reviewed. No pertinent family history.  Medications:   Current Outpatient Medications on File Prior to Visit  Medication Sig Dispense Refill   aspirin  EC 81 MG tablet Take 1 tablet (81 mg total) by mouth daily. Swallow whole.     atorvastatin  (LIPITOR ) 40 MG tablet Take 1 tablet (40 mg total) by mouth daily. 30 tablet 2   azithromycin (ZITHROMAX) 500 MG tablet Take 500 mg by mouth daily.     carvedilol  (COREG ) 3.125 MG tablet Take 1 tablet (3.125 mg total) by mouth 2 (two) times daily with a meal. 60 tablet 2   docusate sodium  (COLACE) 100 MG capsule Take 1 capsule (100 mg total) by mouth 2 (two) times daily. 10 capsule 0   finasteride  (PROSCAR ) 5 MG tablet Take 1 tablet (5 mg total) by mouth daily. 30 tablet 2  FLUoxetine  (PROZAC ) 20 MG capsule Take 20 mg by mouth daily.     folic acid  (FOLVITE ) 1 MG tablet Take 1 tablet (1 mg total) by mouth daily. 30 tablet 2   guaiFENesin (MUCINEX) 600 MG 12 hr tablet Take by mouth 2 (two) times daily.     pantoprazole  (PROTONIX ) 40 MG tablet Take 1 tablet (40 mg total) by mouth daily. 90 tablet 1   polyethylene glycol (MIRALAX  / GLYCOLAX ) 17 g packet Take 17 g by mouth daily as needed. 14 each 0   QUEtiapine  (SEROQUEL ) 25 MG tablet Take 1 tablet (25 mg total) by mouth every morning. 90 tablet 0   QUEtiapine  (SEROQUEL ) 50 MG tablet Take 1 tablet (50 mg total) by mouth daily at 6 PM. 90 tablet 0   senna-docusate (SENOKOT-S) 8.6-50 MG tablet Take 1 tablet by mouth at bedtime as needed for mild constipation. 15 tablet 0   tamsulosin  (FLOMAX ) 0.4 MG CAPS capsule Take 1 capsule (0.4 mg total) by mouth daily. 30 capsule 2   No current facility-administered medications on file prior to visit.    Allergies:   Allergies  Allergen Reactions   Codeine    Shellfish Allergy       OBJECTIVE:  Physical Exam  Vitals:   10/21/23 1436  BP: 137/65  Pulse: (!) 57  Weight: 134 lb (60.8 kg)   Height: 5\' 5"  (1.651 m)   Body mass index is 22.3 kg/m. No results found.   General: well developed, well nourished, very pleasant elderly Caucasian male, seated, in no evident distress  Neurologic Exam Mental Status: Awake and fully alert. mild dysarthria although poor denture noted. Able to state name and birthday, disoriented to place and time. Recent and remote memory impaired. Attention span, concentration and fund of knowledge impaired. Mood and affect appropriate and cooperative with exam Cranial Nerves: Fundoscopic exam reveals sharp disc margins. Pupils equal, briskly reactive to light. Extraocular movements full without nystagmus. Visual fields full to confrontation. Hearing intact. Facial sensation intact. Face, tongue, palate moves normally and symmetrically.  Motor: Normal bulk and tone. Normal strength in all tested extremity muscles Sensory.: intact to touch , pinprick , position and vibratory sensation.  Coordination: Rapid alternating movements normal in all extremities. Finger-to-nose and heel-to-shin performed accurately bilaterally. Gait and Station: Arises from chair without difficulty. Stance is hunched. Gait demonstrates antalgic gait without use of AD Reflexes: 1+ and symmetric. Toes downgoing.     NIHSS  1 Modified Rankin  3      ASSESSMENT: Dennis [name] is a 78 y.o. year old male with left frontal linear and left insular punctate acute infarcts and right frontal punctate subacute infarct in 06/2023 likely cardioembolic from cardiomyopathy with low EF. Vascular risk factors include cardiomyopathy, carotid stenosis and R VA stenosis, HTN, HLD and suspected advanced dementia.      PLAN:  Embolic strokes:  Residual deficit: unclear worsening cognition post stroke as noted baseline dementia. Dementia currently being managed by memory care facility.  Continue aspirin  81mg  daily and atorvastatin  (Lipitor ) for secondary stroke prevention managed/prescribed by  PCP.   Discussed secondary stroke prevention measures and importance of close PCP follow up for aggressive stroke risk factor management including BP goal<130/90, and HLD with LDL goal<70 Stroke labs with 06/2023: LDL 105, A1c 5.5 I have gone over the pathophysiology of stroke, warning signs and symptoms, risk factors and their management in some detail with instructions to go to the closest emergency room for symptoms of concern.  Cardiomyopathy:  EF 20 to 25% 06/2023 EF >45% 09/2023 No off Eliquis  and on aspirin  Advised to schedule consult visit with cardiology for ongoing monitoring     No further recommendations from stroke standpoint and is closely being followed by memory care facility.  He can follow-up here on an as-needed basis   CC:  [facility] provider: Dr. Janett Medin    I personally spent a total of 55 minutes in the care of the patient today including preparing to see the patient, getting/reviewing separately obtained history, performing a medically appropriate exam/evaluation, counseling and educating, referring and communicating with other health care professionals, documenting clinical information in the EHR, and extensive review of recurrent hospitalizations.   Johny Nap, AGNP-BC  [facility] [address] [place], [place] [address]  Phone [phone] Fax [phone] Note: This document was prepared with digital dictation and possible smart phrase technology. Any transcriptional errors that result from this process are unintentional.

## 2023-10-21 NOTE — Patient Instructions (Addendum)
 Patient needs to be scheduled with cardiology for follow up of cardiomyopathy (referral placed to HeartCare at [facility] on 5/19) - please call them to schedule initial visit   Continue aspirin  81 mg daily  and atorvastatin   for secondary stroke prevention  Continue to follow up with PCP regarding blood pressure and cholesterol management  Maintain strict control of hypertension with blood pressure goal below 130/90 and cholesterol with LDL cholesterol (bad cholesterol) goal below 70 mg/dL.   Signs of a Stroke? Follow the BEFAST method Balance Watch for a sudden loss of balance, trouble with coordination or vertigo Eyes Is there a sudden loss of vision in one or both eyes? Or double vision?  Face: Ask the person to smile. Does one side of the face droop or is it numb?  Arms: Ask the person to raise both arms. Does one arm drift downward? Is there weakness or numbness of a leg? Speech: Ask the person to repeat a simple phrase. Does the speech sound slurred/strange? Is the person confused ? Time: If you observe any of these signs, call 911.     No further recommendations from stroke standpoint and recommend continued follow up with memory care unit for stroke risk factor management. Can follow up here as needed at this time.      Thank you for coming to see us  at [facility]. I hope we have been able to provide you high quality care today.  You may receive a patient satisfaction survey over the next few weeks. We would appreciate your feedback and comments so that we may continue to improve ourselves and the health of our patients.

## 2023-10-24 NOTE — Progress Notes (Signed)
 I agree with the above plan

## 2023-12-28 ENCOUNTER — Emergency Department (HOSPITAL_COMMUNITY)

## 2023-12-28 ENCOUNTER — Observation Stay (HOSPITAL_COMMUNITY)

## 2023-12-28 ENCOUNTER — Other Ambulatory Visit: Payer: Self-pay

## 2023-12-28 ENCOUNTER — Observation Stay (HOSPITAL_COMMUNITY)
Admission: EM | Admit: 2023-12-28 | Discharge: 2023-12-30 | Disposition: A | Discharge status: Home / Self Care | Attending: Internal Medicine | Admitting: Internal Medicine

## 2023-12-28 ENCOUNTER — Encounter (HOSPITAL_COMMUNITY): Payer: Self-pay | Admitting: Internal Medicine

## 2023-12-28 DIAGNOSIS — I700 Atherosclerosis of aorta: Secondary | ICD-10-CM | POA: Insufficient documentation

## 2023-12-28 DIAGNOSIS — Z7982 Long term (current) use of aspirin: Secondary | ICD-10-CM | POA: Diagnosis not present

## 2023-12-28 DIAGNOSIS — R2681 Unsteadiness on feet: Secondary | ICD-10-CM | POA: Insufficient documentation

## 2023-12-28 DIAGNOSIS — F0390 Unspecified dementia without behavioral disturbance: Secondary | ICD-10-CM | POA: Diagnosis not present

## 2023-12-28 DIAGNOSIS — I6523 Occlusion and stenosis of bilateral carotid arteries: Secondary | ICD-10-CM | POA: Insufficient documentation

## 2023-12-28 DIAGNOSIS — G9340 Encephalopathy, unspecified: Secondary | ICD-10-CM | POA: Insufficient documentation

## 2023-12-28 DIAGNOSIS — G459 Transient cerebral ischemic attack, unspecified: Secondary | ICD-10-CM | POA: Diagnosis not present

## 2023-12-28 DIAGNOSIS — R4182 Altered mental status, unspecified: Secondary | ICD-10-CM | POA: Diagnosis not present

## 2023-12-28 DIAGNOSIS — E785 Hyperlipidemia, unspecified: Secondary | ICD-10-CM | POA: Diagnosis not present

## 2023-12-28 DIAGNOSIS — D649 Anemia, unspecified: Secondary | ICD-10-CM | POA: Diagnosis not present

## 2023-12-28 DIAGNOSIS — N400 Enlarged prostate without lower urinary tract symptoms: Secondary | ICD-10-CM | POA: Insufficient documentation

## 2023-12-28 DIAGNOSIS — Z8673 Personal history of transient ischemic attack (TIA), and cerebral infarction without residual deficits: Secondary | ICD-10-CM | POA: Insufficient documentation

## 2023-12-28 DIAGNOSIS — G928 Other toxic encephalopathy: Secondary | ICD-10-CM | POA: Diagnosis not present

## 2023-12-28 DIAGNOSIS — R569 Unspecified convulsions: Secondary | ICD-10-CM | POA: Diagnosis not present

## 2023-12-28 DIAGNOSIS — D638 Anemia in other chronic diseases classified elsewhere: Secondary | ICD-10-CM | POA: Diagnosis not present

## 2023-12-28 DIAGNOSIS — R471 Dysarthria and anarthria: Principal | ICD-10-CM | POA: Insufficient documentation

## 2023-12-28 DIAGNOSIS — R29818 Other symptoms and signs involving the nervous system: Secondary | ICD-10-CM | POA: Diagnosis not present

## 2023-12-28 DIAGNOSIS — Z8679 Personal history of other diseases of the circulatory system: Secondary | ICD-10-CM

## 2023-12-28 DIAGNOSIS — I10 Essential (primary) hypertension: Secondary | ICD-10-CM | POA: Insufficient documentation

## 2023-12-28 DIAGNOSIS — E782 Mixed hyperlipidemia: Secondary | ICD-10-CM

## 2023-12-28 DIAGNOSIS — R4701 Aphasia: Secondary | ICD-10-CM | POA: Diagnosis present

## 2023-12-28 DIAGNOSIS — R4781 Slurred speech: Secondary | ICD-10-CM | POA: Diagnosis present

## 2023-12-28 HISTORY — DX: Unspecified dementia, unspecified severity, without behavioral disturbance, psychotic disturbance, mood disturbance, and anxiety: F03.90

## 2023-12-28 LAB — COMPREHENSIVE METABOLIC PANEL WITH GFR
ALT: 13 U/L (ref 0–44)
AST: 38 U/L (ref 15–41)
Albumin: 3.3 g/dL — ABNORMAL LOW (ref 3.5–5.0)
Alkaline Phosphatase: 27 U/L — ABNORMAL LOW (ref 38–126)
Anion gap: 11 (ref 5–15)
BUN: 21 mg/dL (ref 8–23)
CO2: 22 mmol/L (ref 22–32)
Calcium: 9.1 mg/dL (ref 8.9–10.3)
Chloride: 106 mmol/L (ref 98–111)
Creatinine, Ser: 1.17 mg/dL (ref 0.61–1.24)
GFR, Estimated: >60 mL/min (ref >60)
Glucose, Bld: 87 mg/dL (ref 70–99)
Potassium: 5.4 mmol/L — ABNORMAL HIGH (ref 3.5–5.1)
Sodium: 139 mmol/L (ref 135–145)
Total Bilirubin: 1.3 mg/dL — ABNORMAL HIGH (ref 0.0–1.2)
Total Protein: 6.3 g/dL — ABNORMAL LOW (ref 6.5–8.1)

## 2023-12-28 LAB — DIFFERENTIAL
Abs Immature Granulocytes: 0.02 K/uL (ref 0.00–0.07)
Basophils Absolute: 0 K/uL (ref 0.0–0.1)
Basophils Relative: 0 %
Eosinophils Absolute: 0.2 K/uL (ref 0.0–0.5)
Eosinophils Relative: 3 %
Immature Granulocytes: 0 %
Lymphocytes Relative: 28 %
Lymphs Abs: 2.3 K/uL (ref 0.7–4.0)
Monocytes Absolute: 0.9 K/uL (ref 0.1–1.0)
Monocytes Relative: 11 %
Neutro Abs: 4.8 K/uL (ref 1.7–7.7)
Neutrophils Relative %: 58 %

## 2023-12-28 LAB — CBC
HCT: 38.6 % — ABNORMAL LOW (ref 39.0–52.0)
Hemoglobin: 12.4 g/dL — ABNORMAL LOW (ref 13.0–17.0)
MCH: 28.1 pg (ref 26.0–34.0)
MCHC: 32.1 g/dL (ref 30.0–36.0)
MCV: 87.5 fL (ref 80.0–100.0)
Platelets: 213 K/uL (ref 150–400)
RBC: 4.41 MIL/uL (ref 4.22–5.81)
RDW: 15.9 % — ABNORMAL HIGH (ref 11.5–15.5)
WBC: 8.1 K/uL (ref 4.0–10.5)
nRBC: 0 % (ref 0.0–0.2)

## 2023-12-28 LAB — I-STAT CHEM 8, ED
BUN: 29 mg/dL — ABNORMAL HIGH (ref 8–23)
Calcium, Ion: 1.04 mmol/L — ABNORMAL LOW (ref 1.15–1.40)
Chloride: 108 mmol/L (ref 98–111)
Creatinine, Ser: 1.2 mg/dL (ref 0.61–1.24)
Glucose, Bld: 89 mg/dL (ref 70–99)
HCT: 38 % — ABNORMAL LOW (ref 39.0–52.0)
Hemoglobin: 12.9 g/dL — ABNORMAL LOW (ref 13.0–17.0)
Potassium: 5.5 mmol/L — ABNORMAL HIGH (ref 3.5–5.1)
Sodium: 139 mmol/L (ref 135–145)
TCO2: 25 mmol/L (ref 22–32)

## 2023-12-28 LAB — URINALYSIS, ROUTINE W REFLEX MICROSCOPIC
Bilirubin Urine: NEGATIVE
Glucose, UA: NEGATIVE mg/dL
Hgb urine dipstick: NEGATIVE
Ketones, ur: NEGATIVE mg/dL
Leukocytes,Ua: NEGATIVE
Nitrite: NEGATIVE
Protein, ur: NEGATIVE mg/dL
Specific Gravity, Urine: 1.036 — ABNORMAL HIGH (ref 1.005–1.030)
pH: 6 (ref 5.0–8.0)

## 2023-12-28 LAB — ETHANOL: Alcohol, Ethyl (B): <15 mg/dL (ref <15)

## 2023-12-28 LAB — MAGNESIUM: Magnesium: 1.9 mg/dL (ref 1.7–2.4)

## 2023-12-28 LAB — PROTIME-INR
INR: 1.1 (ref 0.8–1.2)
Prothrombin Time: 14.4 s (ref 11.4–15.2)

## 2023-12-28 LAB — CBG MONITORING, ED: Glucose-Capillary: 87 mg/dL (ref 70–99)

## 2023-12-28 LAB — APTT: aPTT: 27 s (ref 24–36)

## 2023-12-28 MED ORDER — ONDANSETRON HCL 4 MG/2ML IJ SOLN: 4.0000 mg | Freq: Four times a day (QID) | INTRAMUSCULAR | Status: DC | PRN

## 2023-12-28 MED ORDER — SODIUM CHLORIDE 0.9% FLUSH
3.0000 mL | Freq: Once | INTRAVENOUS | Status: AC
  Administered 2023-12-28: 3 mL via INTRAVENOUS

## 2023-12-28 MED ORDER — ACETAMINOPHEN 325 MG PO TABS
650.0000 mg | ORAL_TABLET | Freq: Four times a day (QID) | ORAL | Status: DC | PRN
  Administered 2023-12-29 (×2): 650 mg via ORAL
  Filled 2023-12-28: qty 2

## 2023-12-28 MED ORDER — MIDAZOLAM HCL 2 MG/2ML IJ SOLN
1.0000 mg | Freq: Once | INTRAMUSCULAR | Status: DC | PRN
  Administered 2023-12-29 (×2): 1 mg via INTRAVENOUS
  Filled 2023-12-28: qty 2

## 2023-12-28 MED ORDER — IOHEXOL 350 MG/ML SOLN
75.0000 mL | Freq: Once | INTRAVENOUS | Status: AC | PRN
  Administered 2023-12-28: 75 mL via INTRAVENOUS

## 2023-12-28 MED ORDER — ACETAMINOPHEN 650 MG RE SUPP
650.0000 mg | Freq: Four times a day (QID) | RECTAL | Status: DC | PRN
Start: 2023-12-28 — End: 2023-12-31

## 2023-12-28 MED ORDER — MELATONIN 3 MG PO TABS: 3.0000 mg | ORAL_TABLET | Freq: Every evening | ORAL | Status: DC | PRN

## 2023-12-28 NOTE — ED Provider Notes (Signed)
 [place] EMERGENCY DEPARTMENT AT [facility] Provider Note   CSN: [id number] Arrival date & time: 12/28/23  1351  An emergency department physician performed an initial assessment on this suspected stroke patient at 1357.  Patient presents with: Code Stroke   Dennis [name] is a 78 y.o. male.  With a history of stroke, dementia, metabolic encephalopathy who presents to the ED via EMS for concern of stroke.  Last known well time 0930 today.  Patient presents from skilled nursing facility given concern for slurred speech and difficulty with balance.  Activated as code stroke.  Additional history limited at this time secondary to clinical condition.  This patient is no longer on anticoagulation for atrial fibrillation   HPI     Prior to Admission medications   Medication Sig Start Date End Date Taking? Authorizing Provider  aspirin  EC 81 MG tablet Take 1 tablet (81 mg total) by mouth daily. Swallow whole. 09/20/23   Perri DELENA Meliton Mickey., MD  atorvastatin  (LIPITOR ) 40 MG tablet Take 1 tablet (40 mg total) by mouth daily. 03/16/23   Ghimire, Donalda HERO, MD  azithromycin (ZITHROMAX) 500 MG tablet Take 500 mg by mouth daily. 10/08/23   [provider]  carvedilol  (COREG ) 3.125 MG tablet Take 1 tablet (3.125 mg total) by mouth 2 (two) times daily with a meal. 03/16/23   Ghimire, Donalda HERO, MD  cimetidine (TAGAMET) 200 MG tablet Take 200 mg by mouth 2 (two) times daily.    [provider]  docusate sodium  (COLACE) 100 MG capsule Take 1 capsule (100 mg total) by mouth 2 (two) times daily. 08/05/23   Darci Pore, MD  finasteride  (PROSCAR ) 5 MG tablet Take 1 tablet (5 mg total) by mouth daily. 03/16/23   Ghimire, Donalda HERO, MD  FLUoxetine  (PROZAC ) 20 MG capsule Take 20 mg by mouth daily.    [provider]  folic acid  (FOLVITE ) 1 MG tablet Take 1 tablet (1 mg total) by mouth daily. 03/16/23   Ghimire, Donalda HERO, MD  guaiFENesin (MUCINEX) 600 MG 12 hr  tablet Take by mouth 2 (two) times daily.    [provider]  pantoprazole  (PROTONIX ) 40 MG tablet Take 1 tablet (40 mg total) by mouth daily. 08/06/23   Darci Pore, MD  polyethylene glycol (MIRALAX  / GLYCOLAX ) 17 g packet Take 17 g by mouth daily as needed. 08/05/23   Darci Pore, MD  QUEtiapine  (SEROQUEL ) 25 MG tablet Take 1 tablet (25 mg total) by mouth every morning. 08/06/23   Darci Pore, MD  QUEtiapine  (SEROQUEL ) 50 MG tablet Take 1 tablet (50 mg total) by mouth daily at 6 PM. 08/05/23   Darci Pore, MD  senna-docusate (SENOKOT-S) 8.6-50 MG tablet Take 1 tablet by mouth at bedtime as needed for mild constipation. 08/05/23   Darci Pore, MD  tamsulosin  (FLOMAX ) 0.4 MG CAPS capsule Take 1 capsule (0.4 mg total) by mouth daily. 03/16/23   Ghimire, Donalda HERO, MD    Allergies: Codeine and Shellfish allergy    Review of Systems  Updated Vital Signs BP (!) 154/68   Pulse 62   Temp 97.9 F (36.6 C) (Temporal)   Resp 14   Wt 64 kg   SpO2 97%   BMI 23.48 kg/m   Physical Exam Vitals and nursing note reviewed.  HENT:     Head: Normocephalic and atraumatic.  Eyes:     Pupils: Pupils are equal, round, and reactive to light.  Cardiovascular:     Rate and Rhythm:  Normal rate and regular rhythm.  Pulmonary:     Effort: Pulmonary effort is normal.     Breath sounds: Normal breath sounds.  Abdominal:     Palpations: Abdomen is soft.     Tenderness: There is no abdominal tenderness.  Skin:    General: Skin is warm and dry.  Neurological:     Mental Status: He is alert.     Comments: Bilateral lower extremity weakness, symmetric, 5 out of 5 motor strength bilateral upper extremities Waxing waning slurred speech.  Psychiatric:        Mood and Affect: Mood normal.     (all labs ordered are listed, but only abnormal results are displayed) Labs Reviewed  CBC - Abnormal; Notable for the following components:      Result Value    Hemoglobin 12.4 (*)    HCT 38.6 (*)    RDW 15.9 (*)    All other components within normal limits  COMPREHENSIVE METABOLIC PANEL WITH GFR - Abnormal; Notable for the following components:   Potassium 5.4 (*)    Total Protein 6.3 (*)    Albumin 3.3 (*)    Alkaline Phosphatase 27 (*)    Total Bilirubin 1.3 (*)    All other components within normal limits  I-STAT CHEM 8, ED - Abnormal; Notable for the following components:   Potassium 5.5 (*)    BUN 29 (*)    Calcium , Ion 1.04 (*)    Hemoglobin 12.9 (*)    HCT 38.0 (*)    All other components within normal limits  PROTIME-INR  APTT  DIFFERENTIAL  ETHANOL  URINALYSIS, ROUTINE W REFLEX MICROSCOPIC  CBG MONITORING, ED    EKG: EKG Interpretation Date/Time:  Sunday December 28 2023 14:14:50 EDT Ventricular Rate:  67 PR Interval:  173 QRS Duration:  90 QT Interval:  416 QTC Calculation: 440 R Axis:   47  Text Interpretation: Sinus rhythm Repol abnrm suggests ischemia, anterolateral Minimal ST elevation, anterior leads Confirmed by Pamella Sharper [phone]) on 12/28/2023 2:53:03 PM  Radiology: CT ANGIO HEAD NECK W WO CM (CODE STROKE) Result Date: 12/28/2023 EXAM: CTA HEAD AND NECK WITH AND WITHOUT 12/28/2023 02:11:32 PM TECHNIQUE: CTA of the head and neck was performed with and without the administration of intravenous contrast. Multiplanar 2D and/or 3D reformatted images are provided for review. Automated exposure control, iterative reconstruction, and/or weight based adjustment of the mA/kV was utilized to reduce the radiation dose to as low as reasonably achievable. Stenosis of the internal carotid arteries measured using NASCET criteria. COMPARISON: CT and CTA head and neck dated 12/28/2023. CLINICAL HISTORY: Neuro deficit, acute, stroke suspected. FINDINGS: CTA NECK: AORTIC ARCH AND ARCH VESSELS: Moderate atherosclerosis of the visualized aortic arch. Atherosclerosis of the aortic arch vessel origins without high-grade stenosis. The  subclavian arteries are patent bilaterally. CERVICAL CAROTID ARTERIES: Mild atherosclerosis of the right common carotid artery. Bulky calcified atherosclerosis at the carotid bifurcation and along the proximal cervical ICA where there is approximately 55% stenosis. There is tortuosity of the mid right cervical ICA. Mild atherosclerosis of the left common carotid artery. Atherosclerosis at the left carotid bifurcation and along the proximal left cervical ICA where there is approximately 40% stenosis. Mild tortuosity of the mid cervical ICA. CERVICAL VERTEBRAL ARTERIES: Atherosclerosis at the right vertebral artery origin resulting in moderate stenosis. The vertebral arteries are patent from the origins to the vertebrobasilar confluence. Atherosclerosis of the left V4 segment resulting in mild stenosis. LUNGS AND MEDIASTINUM: Unremarkable. SOFT TISSUES: No  acute abnormality. BONES: Degenerative changes in the visualized spine with disc space narrowing most pronounced at C4-5 through C6-7. CTA HEAD: ANTERIOR CIRCULATION: The intracranial internal carotid arteries are patent bilaterally. Atherosclerosis of the carotid siphons resulting in mild stenosis. The MCAs are patent bilaterally. The ACAs are patent bilaterally. POSTERIOR CIRCULATION: The basilar artery is patent. The PCAs are patent bilaterally. The superior cerebellar arteries are patent bilaterally. OTHER: No dural venous sinus thrombosis on this non-dedicated study. IMPRESSION: 1. No large vessel occlusion. 2. 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 3. Atherosclerosis with approximately 40% stenosis at the proximal left cervical ICA. 4. Moderate stenosis at the right vertebral artery origin. 5. Moderate atherosclerosis of the aortic arch. Electronically signed by: Donnice Mania MD 12/28/2023 02:28 PM EDT RP Workstation: [id number]   CT HEAD CODE STROKE WO CONTRAST Result Date: 12/28/2023 EXAM: CT HEAD WITHOUT  12/28/2023 01:59:30 PM TECHNIQUE: CT of the head was performed without the administration of intravenous contrast. Automated exposure control, iterative reconstruction, and/or weight based adjustment of the mA/kV was utilized to reduce the radiation dose to as low as reasonably achievable. COMPARISON: CT head 09/16/2023 CLINICAL HISTORY: Neuro deficit, acute, stroke suspected. FINDINGS: BRAIN AND VENTRICLES: No acute intracranial hemorrhage. No mass effect or midline shift. No extra-axial fluid collection. Gray-white differentiation is maintained. No hydrocephalus. Nonspecific hypoattenuation in the periventricular and subcortical white matter, most likely representing chronic small vessel disease. Similar appearance of remote cortical infarct in the posterior right frontal lobe. Generalized parenchymal volume loss. No edema, mass effect, or midline shift. No extraaxial fluid collections. ORBITS: No acute abnormality. SINUSES AND MASTOIDS: Mucosal thickening in the right sphenoid and right maxillary sinuses. SOFT TISSUES AND SKULL: No acute skull fracture. No acute soft tissue abnormality. [place] stroke program early CT (ASPECT) score Ganglionic (caudate, IC, lentiform nucleus, insula, M1-M3): 7 Supraganglionic (M4-M6): 3 Total: 10 IMPRESSION: 1. No acute intracranial abnormality. 2. 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 3. 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 4. Findings messaged to Dr. Arora via the [facility] messaging system at 2:05PM on 12/28/23. Electronically signed by: Donnice Mania MD 12/28/2023 02:06 PM EDT RP Workstation: [id number]     Procedures   Medications Ordered in the ED  midazolam  (VERSED ) injection 1 mg (has no administration in time range)  sodium chloride  flush (NS) 0.9 % injection 3 mL (3 mLs Intravenous Given 12/28/23 1416)  iohexol  (OMNIPAQUE ) 350 MG/ML injection 75 mL (75 mLs Intravenous Contrast Given 12/28/23 1411)    Clinical  Course as of 12/28/23 1557  Sun Dec 28, 2023  1510 Initial laboratory workup notable for hyperkalemia 5.4 without EKG changes.  Patient has remained hemodynamically stable here.  Awaiting MRI brain and UA  I, Ozell Marine DO, am transitioning care of this patient to the oncoming provider pending MRI, reevaluation and disposition [MP]    Clinical Course User Index [MP] Marine Ozell LABOR, DO                                 Medical Decision Making 78 year old male with history as above presenting from skilled nursing facility given concern for potential stroke.  Activated as code stroke.  Last known well time of 0930.  Initial stroke scale score of 18 as detailed in neurology note.  Difficulty with balance and expressive aphasia.  CT head and CTA head and neck showed no acute processes.  Examined with neurology team who recommends MRI.  Will obtain laboratory workup including stroke panel.  Will continue to monitor for any changes in neurologic or mental status  Amount and/or Complexity of Data Reviewed Labs: ordered. Radiology: ordered.        Final diagnoses:  Slurred speech  History of stroke  Dementia, unspecified dementia severity, unspecified dementia type, unspecified whether behavioral, psychotic, or mood disturbance or anxiety [facility])    ED Discharge Orders     None          Pamella Ozell LABOR, DO 12/28/23 1557

## 2023-12-28 NOTE — Consult Note (Cosign Needed Addendum)
 NEUROLOGY CONSULT NOTE   Date of service: December 28, 2023 Patient Name: Dennis [name] MRN:  [id number] DOB:  [birth year] Chief Complaint: Code Stroke Requesting Provider: Patt Alm Macho, MD  History of Present Illness  Dennis [name] is a 78 y.o. 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 He was last seen normal around 0930am. He is currently living at a nursing home. On evaluation at his last neurology appointment he was oriented to self and had impaired recent and remote memory with mild dysarthria. He was admitted 5/1 after a syncopal episode and he was found to have an AKI and a UTI.  At that time his EF was 45% he was taken off of the Eliquis  and continued on ASA 81mg .  Today he was last seen normal at 9:30 in the morning by staff at his nursing home.  At that time he was talking, although his baseline talking is comprehensible but does not really pertain to what is going on around him, and walking at baseline.  He was then found confused with garbled speech and a shuffling unsteady gait and EMS was called. On arrival he speech is fluctuating, initially able to say hi sweetie and state his name and then speech becomes garbled. While in CT he is able to follow commands and speech continued to fluctuate. There was also mention of a possible fall 3 days ago, however this was unable to be verified.  Glucose 87, BP 141/67.  LKW: 0930 Modified rankin score: 3-Moderate disability-requires help but walks WITHOUT assistance IV Thrombolysis: No, outside of window  EVT: No LVO  NIHSS components Score: Comment  1a Level of Conscious 0[x]  1[]  2[]  3[]      1b LOC Questions 0[]  1[x]  2[]       1c LOC Commands 0[x]  1[]  2[]       2 Best Gaze 0[x]  1[]  2[]       3 Visual 0[]  1[]  2[]  3[]      4 Facial Palsy 0[x]  1[]  2[]  3[]      5a Motor Arm - left 0[x]  1[]  2[]  3[]  4[]  UN[]     5b Motor Arm - Right 0[x]  1[]  2[]  3[]  4[]  UN[]    6a Motor Leg - Left 0[]  1[]  2[x]  3[]  4[]  UN[]    6b Motor Leg - Right 0[]  1[]  2[x]  3[]  4[]  UN[]    7 Limb Ataxia 0[x]  1[]  2[]  UN[]      8 Sensory 0[x]  1[]  2[]  UN[]      9 Best Language 0[]  1[]  2[x]  3[]    Fluctuating  10 Dysarthria 0[]  1[x]  2[]  UN[]    Fluctuating   11 Extinct. and Inattention 0[x]  1[]  2[]       TOTAL:8       ROS  Comprehensive ROS performed and pertinent positives documented in HPI   Past History   Past Medical History:  Diagnosis Date   Arthritis    Hypertension     Past Surgical History:  Procedure Laterality Date   CHOLECYSTECTOMY     THROAT SURGERY      Family History: No family history on file.  Social History  reports that he has quit smoking. His smoking use included cigarettes. He has never used smokeless tobacco. He reports that he does not drink alcohol  and does not use drugs.  Allergies  Allergen Reactions   Codeine    Shellfish Allergy     Medications   Current Facility-Administered  Medications:    midazolam  (VERSED ) injection 1 mg, 1 mg, Intravenous, Once PRN, Remi Pippin, NP  Current Outpatient Medications:    aspirin  EC 81 MG tablet, Take 1 tablet (81 mg total) by mouth daily. Swallow whole., Disp: , Rfl:    atorvastatin  (LIPITOR ) 40 MG tablet, Take 1 tablet (40 mg total) by mouth daily., Disp: 30 tablet, Rfl: 2   azithromycin (ZITHROMAX) 500 MG tablet, Take 500 mg by mouth daily., Disp: , Rfl:    carvedilol  (COREG ) 3.125 MG tablet, Take 1 tablet (3.125 mg total) by mouth 2 (two) times daily with a meal., Disp: 60 tablet, Rfl: 2   cimetidine (TAGAMET) 200 MG tablet, Take 200 mg by mouth 2 (two) times daily., Disp: , Rfl:    docusate sodium  (COLACE) 100 MG capsule, Take 1 capsule (100 mg total) by mouth 2 (two) times daily., Disp: 10 capsule, Rfl: 0   finasteride  (PROSCAR ) 5 MG tablet, Take 1 tablet (5 mg total) by mouth daily., Disp: 30 tablet, Rfl: 2   FLUoxetine  (PROZAC ) 20 MG  capsule, Take 20 mg by mouth daily., Disp: , Rfl:    folic acid  (FOLVITE ) 1 MG tablet, Take 1 tablet (1 mg total) by mouth daily., Disp: 30 tablet, Rfl: 2   guaiFENesin (MUCINEX) 600 MG 12 hr tablet, Take by mouth 2 (two) times daily., Disp: , Rfl:    pantoprazole  (PROTONIX ) 40 MG tablet, Take 1 tablet (40 mg total) by mouth daily., Disp: 90 tablet, Rfl: 1   polyethylene glycol (MIRALAX  / GLYCOLAX ) 17 g packet, Take 17 g by mouth daily as needed., Disp: 14 each, Rfl: 0   QUEtiapine  (SEROQUEL ) 25 MG tablet, Take 1 tablet (25 mg total) by mouth every morning., Disp: 90 tablet, Rfl: 0   QUEtiapine  (SEROQUEL ) 50 MG tablet, Take 1 tablet (50 mg total) by mouth daily at 6 PM., Disp: 90 tablet, Rfl: 0   senna-docusate (SENOKOT-S) 8.6-50 MG tablet, Take 1 tablet by mouth at bedtime as needed for mild constipation., Disp: 15 tablet, Rfl: 0   tamsulosin  (FLOMAX ) 0.4 MG CAPS capsule, Take 1 capsule (0.4 mg total) by mouth daily., Disp: 30 capsule, Rfl: 2  Vitals   Vitals:   12/28/23 1500 12/28/23 1515 12/28/23 1530 12/28/23 1542  BP: (!) 142/76 (!) 144/76 (!) 154/68   Pulse: 60 (!) 59 (!) 57 62  Resp: 18 15 15 14   Temp:      TempSrc:      SpO2: 97% 97% 95% 97%  Weight:        Body mass index is 23.48 kg/m.   Physical Exam   Constitutional: Appears well-developed and well-nourished.  Psych: Affect appropriate to situation.  Eyes: No scleral injection.  HENT: No OP obstruction.  Head: Normocephalic.  Cardiovascular: Normal rate and regular rhythm.  Respiratory: Effort normal, non-labored breathing.  GI: Soft.  No distension. There is no tenderness.  Skin: WDI.   Neurologic Examination    Neuro: Mental Status: Patient is awake, alert, oriented to person.  Speech is dysarthric with expressive aphasia. He is able to follow simple commands Cranial Nerves: II: Visual Fields are full. Pupils are equal, round, and reactive to light.   III,IV, VI: EOMI without ptosis or diploplia.  V:  Facial sensation is symmetric to temperature VII: Facial movement is symmetric resting and smiling VIII: Hearing is intact to voice X: Palate elevates symmetrically XI: Shoulder shrug is symmetric. XII: Tongue protrudes midline without atrophy or fasciculations.  Motor: Tone is normal. Bulk is  normal. Bilateral upper extremities with good strength and no drift. Bilateral lower extremities drift to bed.  Sensory: Sensation is grossly symmetric to light touch Cerebellar: No gross ataxia noted in arms with movement   Labs/Imaging/Neurodiagnostic studies   CBC:  Recent Labs  Lab 05-Jan-2024 1353 01-05-2024 1357  WBC 8.1  --   NEUTROABS 4.8  --   HGB 12.4* 12.9*  HCT 38.6* 38.0*  MCV 87.5  --   PLT 213  --    Basic Metabolic Panel:  Lab Results  Component Value Date   NA 139 01/05/2024   K 5.5 (H) January 05, 2024   CO2 22 2024-01-05   GLUCOSE 89 01-05-24   BUN 29 (H) 2024-01-05   CREATININE 1.20 January 05, 2024   CALCIUM  9.1 2024-01-05   GFRNONAA >60 01-05-2024   GFRAA  11/20/2006    >60        The eGFR has been calculated using the MDRD equation. This calculation has not been validated in all clinical   Lipid Panel:  Lab Results  Component Value Date   LDLCALC 112 (H) 06/21/2023   HgbA1c:  Lab Results  Component Value Date   HGBA1C 5.4 06/21/2023   Urine Drug Screen:     Component Value Date/Time   LABOPIA NONE DETECTED 06/21/2023 0047   COCAINSCRNUR NONE DETECTED 06/21/2023 0047   LABBENZ NONE DETECTED 06/21/2023 0047   AMPHETMU NONE DETECTED 06/21/2023 0047   THCU NONE DETECTED 06/21/2023 0047   LABBARB NONE DETECTED 06/21/2023 0047    Alcohol  Level     Component Value Date/Time   ETH <15 2024-01-05 1352   INR  Lab Results  Component Value Date   INR 1.1 2024/01/05   APTT  Lab Results  Component Value Date   APTT 27 01/05/24     CT Head without contrast(Personally reviewed): 1. No acute intracranial abnormality. 2. Remote cortical infarct in the  posterior right frontal lobe. 3. 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  CT angio Head and Neck with contrast(Personally reviewed): Atherosclerosis with approximately 55% stenosis at the proximal right cervical ICA. Atherosclerosis with approximately 40% stenosis at the proximal left cervical ICA. Moderate stenosis at the right vertebral artery origin.  MRI Brain(Personally reviewed): Ordered and pending  ASSESSMENT   Dennis [name] is a 78 y.o. 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 He was last seen normal around 0930am per his facility staff but had fluctuating speech with being able to say a sentence sometimes and then garbled speech.  By the time his CT head was completed and reviewed he was just a minute or so past the 4-1/2-hour mark.  In addition to the intermittent aphasia, he had bilateral leg weakness making exam somewhat more confusing and difficult to localize. Unclear if this is a new stroke versus worsening of old symptoms versus encephalopathy in the setting of an acute infection.  TNK not offered due to waxing and waning symptoms and unclear localization with likelihood of this not being a stroke and being very close or a minute or 2 past the 4-1/2-hour mark making the risks of TNK higher than the benefits that may provide. MRI Brain ordered, stroke work up if positive, additionally ordered UA.   Impression: Evaluate for stroke versus toxic metabolic encephalopathy  RECOMMENDATIONS  - MRI brain wo - Stroke work up if positive - Continue ASA 81mg  - UA - Additional  causes of AMS per EDP-UA, chest x-ray ______________________________________________________________________  Signed, Jorene Last, NP [facility]  Attending Neurohospitalist Addendum Patient seen and examined with APP/Resident. Agree with the  history and physical as documented above. Agree with the plan as documented, which I helped formulate. I have independently reviewed the chart, obtained history, review of systems and examined the patient.I have personally reviewed pertinent head/neck/spine imaging (CT/MRI).    Attempted to reach the nursing facility-no response.  Agree with the history and physical above that I have edited.  Patient very close to the 4 and half hour mark may be few minutes after the 4-1/2-hour mark hence not given TNK also because exam did not appear very focal with bilateral lower extremity weakness and intermittent word finding difficulty.  No ELVO on personal review of the CTA but 55% stenosis of the right cervical ICA and 40% stenosis of the left cervical ICA along with right vertebral artery origin stenosis and moderate atherosclerosis of the aortic arch. MRI would further clear the picture, as well as any evidence of infection should be looked at.  Impression: Strokelike episode-with a broad differential.  Recommendations:  MRI negative for acute process on my review. Given his previous stroke history, and prior cortical infarcts, if symptoms resolve, this could be TIA but otherwise he needs evaluation for seizures. Infectious workup thus far has been underwhelming I would recommend admission for EEG and further neurological observation Neurology will follow with you Dr. Patt was made aware  Please feel free to call with any questions.  -- Eligio Lav, MD Neurologist [facility] Pager: [phone]

## 2023-12-28 NOTE — Code Documentation (Signed)
 Stroke Response Nurse Documentation Code Documentation  Dennis [name] is a 78 y.o. male arriving to [facility] via [facility] as code stroke activation. Hx of dementia and stroke with baseline dysarthia. LKW 0930 at his nursing facility at his baseline which he is able to walk and have comprehensible speech. Later found to have garbled speech and abnormal gait.   Stroke team met pt at bridge upon arrival. Labs drawn, airway cleared by EDP Pamella. Patient taken to CT. NIH 8, see flowsheet for details. CT/CTA completed. Arrived within minutes of 4.5 window of TNK, decision for no TNK as no LVO. Care Plan: NIH/Vitals q2h x12h then per unit routine. Bedside handoff with ED RN Chloe.    Tonna Lacks K  Rapid Response RN

## 2023-12-28 NOTE — ED Notes (Signed)
 Pt going to CT

## 2023-12-28 NOTE — H&P (Signed)
 History and Physical      Dennis [name] FMW:[id number] DOB: December 14, 1945 DOA: 12/28/2023; DOS: 12/28/2023  PCP: Pcp, No *** Patient coming from: home ***  I have personally briefly reviewed patient's old medical records in [facility]  Chief Complaint: ***  HPI: Dennis [name] is a 78 y.o. male with medical history significant for *** who is admitted to [facility] on 12/28/2023 with *** after presenting from home*** to [facility] ED complaining of ***.    ***       ***   ED Course:  Vital signs in the ED were notable for the following: ***  Labs were notable for the following: ***  Per my interpretation, EKG in ED demonstrated the following:  ***  Imaging in the ED, per corresponding formal radiology read, was notable for the following:  ***  While in the ED, the following were administered: ***  Subsequently, the patient was admitted  ***  ***red    Review of Systems: As per HPI otherwise 10 point review of systems negative.   Past Medical History:  Diagnosis Date   Arthritis    Hypertension     Past Surgical History:  Procedure Laterality Date   CHOLECYSTECTOMY     THROAT SURGERY      Social History:  reports that he has quit smoking. His smoking use included cigarettes. He has never used smokeless tobacco. He reports that he does not drink alcohol  and does not use drugs.   Allergies  Allergen Reactions   Codeine    Shellfish Allergy     No family history on file.  Family history reviewed and not pertinent ***   Prior to Admission medications   Medication Sig Start Date End Date Taking? Authorizing Provider  aspirin  EC 81 MG tablet Take 1 tablet (81 mg total) by mouth daily. Swallow whole. 09/20/23   Perri DELENA Meliton Mickey., MD  atorvastatin  (LIPITOR ) 40 MG tablet Take 1 tablet (40 mg total) by mouth daily. 03/16/23   Ghimire, Donalda HERO, MD  azithromycin (ZITHROMAX) 500 MG tablet Take 500 mg by mouth daily. 10/08/23   [provider]   carvedilol  (COREG ) 3.125 MG tablet Take 1 tablet (3.125 mg total) by mouth 2 (two) times daily with a meal. 03/16/23   Ghimire, Donalda HERO, MD  cimetidine (TAGAMET) 200 MG tablet Take 200 mg by mouth 2 (two) times daily.    [provider]  docusate sodium  (COLACE) 100 MG capsule Take 1 capsule (100 mg total) by mouth 2 (two) times daily. 08/05/23   Darci Pore, MD  finasteride  (PROSCAR ) 5 MG tablet Take 1 tablet (5 mg total) by mouth daily. 03/16/23   Ghimire, Donalda HERO, MD  FLUoxetine  (PROZAC ) 20 MG capsule Take 20 mg by mouth daily.    [provider]  folic acid  (FOLVITE ) 1 MG tablet Take 1 tablet (1 mg total) by mouth daily. 03/16/23   Ghimire, Donalda HERO, MD  guaiFENesin (MUCINEX) 600 MG 12 hr tablet Take by mouth 2 (two) times daily.    [provider]  pantoprazole  (PROTONIX ) 40 MG tablet Take 1 tablet (40 mg total) by mouth daily. 08/06/23   Darci Pore, MD  polyethylene glycol (MIRALAX  / GLYCOLAX ) 17 g packet Take 17 g by mouth daily as needed. 08/05/23   Darci Pore, MD  QUEtiapine  (SEROQUEL ) 25 MG tablet Take 1 tablet (25 mg total) by mouth every morning. 08/06/23   Darci Pore, MD  QUEtiapine  (SEROQUEL ) 50 MG tablet  Take 1 tablet (50 mg total) by mouth daily at 6 PM. 08/05/23   Darci Pore, MD  senna-docusate (SENOKOT-S) 8.6-50 MG tablet Take 1 tablet by mouth at bedtime as needed for mild constipation. 08/05/23   Darci Pore, MD  tamsulosin  (FLOMAX ) 0.4 MG CAPS capsule Take 1 capsule (0.4 mg total) by mouth daily. 03/16/23   GhimireDonalda HERO, MD     Objective    Physical Exam: Vitals:   12/28/23 1633 12/28/23 1715 12/28/23 1800 12/28/23 1833  BP:  (!) 145/61 (!) 178/76   Pulse: 64 (!) 52 68   Resp: 15 13 17    Temp:    97.6 F (36.4 C)  TempSrc:    Temporal  SpO2: 97% 95% 100%   Weight:        General: appears to be stated age; alert, oriented Skin: warm, dry, no rash Head:  AT/[place] Mouth:   Oral mucosa membranes appear moist, normal dentition Neck: supple; trachea midline Heart:  RRR; did not appreciate any M/R/G Lungs: CTAB, did not appreciate any wheezes, rales, or rhonchi Abdomen: + BS; soft, ND, NT Vascular: 2+ pedal pulses b/l; 2+ radial pulses b/l Extremities: no peripheral edema, no muscle wasting Neuro: strength and sensation intact in upper and lower extremities b/l ***   *** Neuro: 5/5 strength of the proximal and distal flexors and extensors of the upper and lower extremities bilaterally; sensation intact in upper and lower extremities b/l; cranial nerves II through XII grossly intact; no pronator drift; no evidence suggestive of slurred speech, dysarthria, or facial droop; Normal muscle tone. No tremors.  *** Neuro: In the setting of the patient's current mental status and associated inability to follow instructions, unable to perform full neurologic exam at this time.  As such, assessment of strength, sensation, and cranial nerves is limited at this time. Patient noted to spontaneously move all 4 extremities. No tremors.  ***    Labs on Admission: I have personally reviewed following labs and imaging studies  CBC: Recent Labs  Lab 12/28/23 1353 12/28/23 1357  WBC 8.1  --   NEUTROABS 4.8  --   HGB 12.4* 12.9*  HCT 38.6* 38.0*  MCV 87.5  --   PLT 213  --    Basic Metabolic Panel: Recent Labs  Lab 12/28/23 1353 12/28/23 1357  NA 139 139  K 5.4* 5.5*  CL 106 108  CO2 22  --   GLUCOSE 87 89  BUN 21 29*  CREATININE 1.17 1.20  CALCIUM  9.1  --    GFR: Estimated Creatinine Clearance: 43.4 mL/min (by C-G formula based on SCr of 1.2 mg/dL). Liver Function Tests: Recent Labs  Lab 12/28/23 1353  AST 38  ALT 13  ALKPHOS 27*  BILITOT 1.3*  PROT 6.3*  ALBUMIN 3.3*   No results for input(s): LIPASE, AMYLASE in the last 168 hours. No results for input(s): AMMONIA in the last 168 hours. Coagulation Profile: Recent Labs  Lab  12/28/23 1353  INR 1.1   Cardiac Enzymes: No results for input(s): CKTOTAL, CKMB, CKMBINDEX, TROPONINI in the last 168 hours. BNP (last 3 results) No results for input(s): PROBNP in the last 8760 hours. HbA1C: No results for input(s): HGBA1C in the last 72 hours. CBG: Recent Labs  Lab 12/28/23 1352  GLUCAP 87   Lipid Profile: No results for input(s): CHOL, HDL, LDLCALC, TRIG, CHOLHDL, LDLDIRECT in the last 72 hours. Thyroid  Function Tests: No results for input(s): TSH, T4TOTAL, FREET4, T3FREE, THYROIDAB in the last 72 hours.  Anemia Panel: No results for input(s): VITAMINB12, FOLATE, FERRITIN, TIBC, IRON, RETICCTPCT in the last 72 hours. Urine analysis:    Component Value Date/Time   COLORURINE YELLOW 12/28/2023 1630   APPEARANCEUR CLEAR 12/28/2023 1630   LABSPEC 1.036 (H) 12/28/2023 1630   PHURINE 6.0 12/28/2023 1630   GLUCOSEU NEGATIVE 12/28/2023 1630   HGBUR NEGATIVE 12/28/2023 1630   BILIRUBINUR NEGATIVE 12/28/2023 1630   KETONESUR NEGATIVE 12/28/2023 1630   PROTEINUR NEGATIVE 12/28/2023 1630   NITRITE NEGATIVE 12/28/2023 1630   LEUKOCYTESUR NEGATIVE 12/28/2023 1630    Radiological Exams on Admission: MR BRAIN WO CONTRAST Result Date: 12/28/2023 CLINICAL DATA:  Initial evaluation for acute neuro deficit, stroke suspected. EXAM: MRI HEAD WITHOUT CONTRAST TECHNIQUE: Multiplanar, multiecho pulse sequences of the brain and surrounding structures were obtained without intravenous contrast. COMPARISON:  CTs from earlier the same day. FINDINGS: Brain: Diffuse prominence of the CSF containing spaces compatible generalized cerebral atrophy. Patchy and confluent T2/FLAIR hyperintensity involving the periventricular deep white matter both cerebral hemispheres, consistent with chronic small vessel ischemic disease, moderately advanced in nature. No abnormal foci of restricted diffusion to suggest acute or subacute ischemia. Gray-white  matter differentiation maintained. No acute or significant chronic intracranial blood products. No mass lesion, midline shift or mass effect. Mild ventricular prominence related to global parenchymal volume loss without hydrocephalus. No extra-axial fluid collection. Pituitary gland and suprasellar region within normal limits. Vascular: Major intracranial arterial vascular flow voids are maintained. Asymmetric FLAIR hyperintensity seen involving the right transverse and sigmoid sinuses extending into the proximal right internal jugular vein, favored to reflect slow/sluggish flow. Skull and upper cervical spine: Craniocervical junction within normal limits. Bone marrow signal intensity overall within normal limits. No scalp soft tissue abnormality. Sinuses/Orbits: Globes orbital soft tissues within normal limits. Right maxillary sinus retention cyst. Paranasal sinuses are otherwise largely clear. No significant mastoid effusion. Other: None. IMPRESSION: 1. No acute intracranial abnormality. 2. Age-related cerebral atrophy with moderately advanced chronic microvascular ischemic disease. 3. Asymmetric FLAIR hyperintensity involving the right transverse and sigmoid sinuses extending into the proximal right internal jugular vein, favored to reflect slow/sluggish flow. If there is clinical concern for possible dural sinus thrombosis, further evaluation with dedicated MRV could be performed for further evaluation. Electronically Signed   By: Morene Hoard M.D.   On: 12/28/2023 19:19   DG Chest 1 View Result Date: 12/28/2023 CLINICAL DATA:  [id number] Altered mental state 881078 EXAM: CHEST  1 VIEW COMPARISON:  Oct 08, 2023 FINDINGS: No focal airspace consolidation, pleural effusion, or pneumothorax. Mild cardiomegaly. Aortic atherosclerosis. No acute fracture or destructive lesions. Multilevel thoracic osteophytosis. IMPRESSION: No acute cardiopulmonary abnormality. Electronically Signed   By: Rogelia Myers M.D.    On: 12/28/2023 18:59   CT ANGIO HEAD NECK W WO CM (CODE STROKE) Result Date: 12/28/2023 EXAM: CTA HEAD AND NECK WITH AND WITHOUT 12/28/2023 02:11:32 PM TECHNIQUE: CTA of the head and neck was performed with and without the administration of intravenous contrast. Multiplanar 2D and/or 3D reformatted images are provided for review. Automated exposure control, iterative reconstruction, and/or weight based adjustment of the mA/kV was utilized to reduce the radiation dose to as low as reasonably achievable. Stenosis of the internal carotid arteries measured using NASCET criteria. COMPARISON: CT and CTA head and neck dated 12/28/2023. CLINICAL HISTORY: Neuro deficit, acute, stroke suspected. FINDINGS: CTA NECK: AORTIC ARCH AND ARCH VESSELS: Moderate atherosclerosis of the visualized aortic arch. Atherosclerosis of the aortic arch vessel origins without high-grade stenosis. The subclavian arteries are patent bilaterally.  CERVICAL CAROTID ARTERIES: Mild atherosclerosis of the right common carotid artery. Bulky calcified atherosclerosis at the carotid bifurcation and along the proximal cervical ICA where there is approximately 55% stenosis. There is tortuosity of the mid right cervical ICA. Mild atherosclerosis of the left common carotid artery. Atherosclerosis at the left carotid bifurcation and along the proximal left cervical ICA where there is approximately 40% stenosis. Mild tortuosity of the mid cervical ICA. CERVICAL VERTEBRAL ARTERIES: Atherosclerosis at the right vertebral artery origin resulting in moderate stenosis. The vertebral arteries are patent from the origins to the vertebrobasilar confluence. Atherosclerosis of the left V4 segment resulting in mild stenosis. LUNGS AND MEDIASTINUM: Unremarkable. SOFT TISSUES: No acute abnormality. BONES: Degenerative changes in the visualized spine with disc space narrowing most pronounced at C4-5 through C6-7. CTA HEAD: ANTERIOR CIRCULATION: The intracranial internal  carotid arteries are patent bilaterally. Atherosclerosis of the carotid siphons resulting in mild stenosis. The MCAs are patent bilaterally. The ACAs are patent bilaterally. POSTERIOR CIRCULATION: The basilar artery is patent. The PCAs are patent bilaterally. The superior cerebellar arteries are patent bilaterally. OTHER: No dural venous sinus thrombosis on this non-dedicated study. IMPRESSION: 1. No large vessel occlusion. 2. 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 3. Atherosclerosis with approximately 40% stenosis at the proximal left cervical ICA. 4. Moderate stenosis at the right vertebral artery origin. 5. Moderate atherosclerosis of the aortic arch. Electronically signed by: Donnice Mania MD 12/28/2023 02:28 PM EDT RP Workstation: [id number]   CT HEAD CODE STROKE WO CONTRAST Result Date: 12/28/2023 EXAM: CT HEAD WITHOUT 12/28/2023 01:59:30 PM TECHNIQUE: CT of the head was performed without the administration of intravenous contrast. Automated exposure control, iterative reconstruction, and/or weight based adjustment of the mA/kV was utilized to reduce the radiation dose to as low as reasonably achievable. COMPARISON: CT head 09/16/2023 CLINICAL HISTORY: Neuro deficit, acute, stroke suspected. FINDINGS: BRAIN AND VENTRICLES: No acute intracranial hemorrhage. No mass effect or midline shift. No extra-axial fluid collection. Gray-white differentiation is maintained. No hydrocephalus. Nonspecific hypoattenuation in the periventricular and subcortical white matter, most likely representing chronic small vessel disease. Similar appearance of remote cortical infarct in the posterior right frontal lobe. Generalized parenchymal volume loss. No edema, mass effect, or midline shift. No extraaxial fluid collections. ORBITS: No acute abnormality. SINUSES AND MASTOIDS: Mucosal thickening in the right sphenoid and right maxillary sinuses. SOFT TISSUES AND SKULL: No acute skull  fracture. No acute soft tissue abnormality. [place] stroke program early CT (ASPECT) score Ganglionic (caudate, IC, lentiform nucleus, insula, M1-M3): 7 Supraganglionic (M4-M6): 3 Total: 10 IMPRESSION: 1. No acute intracranial abnormality. 2. 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 3. 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 4. Findings messaged to Dr. Arora via the [facility] messaging system at 2:05PM on 12/28/23. Electronically signed by: Donnice Mania MD 12/28/2023 02:06 PM EDT RP Workstation: [id number]      Assessment/Plan   Principal Problem:   Dysarthria   ***            ***                  ***                   ***                  ***                  ***                  ***                   ***                  ***                  ***                  ***                  ***                 ***                ***  DVT prophylaxis: SCD's ***  Code Status: Full code*** Family Communication: none*** Disposition Plan: Per Rounding Team Consults called: none***;  Admission status: ***     I SPENT GREATER THAN 75 *** MINUTES IN CLINICAL CARE TIME/MEDICAL DECISION-MAKING IN COMPLETING THIS ADMISSION.      Eva NOVAK Floretta Petro DO [facility]  From 7PM - 7AM   12/28/2023, 8:27 PM   ***

## 2023-12-28 NOTE — ED Notes (Signed)
 MD made aware of patient passed swallow screen for diet order to be placed

## 2023-12-28 NOTE — ED Triage Notes (Signed)
 Pt BIB [facility] from [facility] nursing facility. Pt has dementia and some deficits from previous stroke at baseline. However, today the nursing staff at the facility reported that pt was exhibiting expressive aphasia and  funny walking .. VSS.

## 2023-12-28 NOTE — ED Provider Notes (Signed)
  Physical Exam  BP (!) 178/76   Pulse 68   Temp 97.6 F (36.4 C) (Temporal)   Resp 17   Wt 64 kg   SpO2 100%   BMI 23.48 kg/m   Physical Exam  Procedures  Procedures  ED Course / MDM   Clinical Course as of 12/28/23 1930  Sun Dec 28, 2023  1510 Initial laboratory workup notable for hyperkalemia 5.4 without EKG changes.  Patient has remained hemodynamically stable here.  Awaiting MRI brain and UA  I, Dennis [name], am transitioning care of this patient to the oncoming provider pending MRI, reevaluation and disposition [MP]    Clinical Course User Index [MP] Marine Dennis LABOR, [name]   Medical Decision Making Care assumed at 3 pm. Patient here with AMS and slurred speech.  Signout pending MRI brain and neurology consult.  Code stroke was activated by EMS  7:31 PM I reviewed patient's labs and potassium is 5.5.  MRI showed possible venous dural thrombosis.  Discussed with Dr. Voncile from neurology.  He recommended CT venogram and if it is positive, patient will need IV heparin .  However he is also concern for possible seizure.  He recommend EEG in the morning.  If he has another seizure at night please consult neurology.  Otherwise neurology plans to follow along tomorrow.  Problems Addressed: Dementia, unspecified dementia severity, unspecified dementia type, unspecified whether behavioral, psychotic, or mood disturbance or anxiety (HCC): chronic illness or injury Slurred speech: acute illness or injury  Amount and/or Complexity of Data Reviewed Labs: ordered. Decision-making details documented in ED Course. Radiology: ordered and independent interpretation performed. Decision-making details documented in ED Course.          Dennis Alm Macho, [name] 12/28/23 [phone]

## 2023-12-29 ENCOUNTER — Encounter (HOSPITAL_COMMUNITY): Payer: Self-pay | Admitting: Internal Medicine

## 2023-12-29 ENCOUNTER — Observation Stay (HOSPITAL_COMMUNITY)

## 2023-12-29 ENCOUNTER — Observation Stay (HOSPITAL_BASED_OUTPATIENT_CLINIC_OR_DEPARTMENT_OTHER)

## 2023-12-29 DIAGNOSIS — R4182 Altered mental status, unspecified: Secondary | ICD-10-CM

## 2023-12-29 DIAGNOSIS — R262 Difficulty in walking, not elsewhere classified: Secondary | ICD-10-CM

## 2023-12-29 DIAGNOSIS — G9340 Encephalopathy, unspecified: Secondary | ICD-10-CM | POA: Diagnosis not present

## 2023-12-29 DIAGNOSIS — Z8679 Personal history of other diseases of the circulatory system: Secondary | ICD-10-CM

## 2023-12-29 DIAGNOSIS — R479 Unspecified speech disturbances: Secondary | ICD-10-CM | POA: Diagnosis not present

## 2023-12-29 DIAGNOSIS — R4701 Aphasia: Secondary | ICD-10-CM | POA: Diagnosis present

## 2023-12-29 DIAGNOSIS — R569 Unspecified convulsions: Secondary | ICD-10-CM | POA: Diagnosis not present

## 2023-12-29 DIAGNOSIS — G459 Transient cerebral ischemic attack, unspecified: Secondary | ICD-10-CM | POA: Diagnosis not present

## 2023-12-29 DIAGNOSIS — R471 Dysarthria and anarthria: Secondary | ICD-10-CM | POA: Diagnosis not present

## 2023-12-29 DIAGNOSIS — D638 Anemia in other chronic diseases classified elsewhere: Secondary | ICD-10-CM | POA: Diagnosis present

## 2023-12-29 LAB — COMPREHENSIVE METABOLIC PANEL WITH GFR
ALT: 15 U/L (ref 0–44)
AST: 28 U/L (ref 15–41)
Albumin: 3 g/dL — ABNORMAL LOW (ref 3.5–5.0)
Alkaline Phosphatase: 26 U/L — ABNORMAL LOW (ref 38–126)
Anion gap: 8 (ref 5–15)
BUN: 18 mg/dL (ref 8–23)
CO2: 24 mmol/L (ref 22–32)
Calcium: 9.2 mg/dL (ref 8.9–10.3)
Chloride: 107 mmol/L (ref 98–111)
Creatinine, Ser: 1.06 mg/dL (ref 0.61–1.24)
GFR, Estimated: >60 mL/min (ref >60)
Glucose, Bld: 82 mg/dL (ref 70–99)
Potassium: 4.4 mmol/L (ref 3.5–5.1)
Sodium: 139 mmol/L (ref 135–145)
Total Bilirubin: 0.6 mg/dL (ref 0.0–1.2)
Total Protein: 6 g/dL — ABNORMAL LOW (ref 6.5–8.1)

## 2023-12-29 LAB — CBC WITH DIFFERENTIAL/PLATELET
Abs Immature Granulocytes: 0.03 K/uL (ref 0.00–0.07)
Basophils Absolute: 0 K/uL (ref 0.0–0.1)
Basophils Relative: 1 %
Eosinophils Absolute: 0.3 K/uL (ref 0.0–0.5)
Eosinophils Relative: 3 %
HCT: 39.9 % (ref 39.0–52.0)
Hemoglobin: 12.6 g/dL — ABNORMAL LOW (ref 13.0–17.0)
Immature Granulocytes: 0 %
Lymphocytes Relative: 31 %
Lymphs Abs: 2.4 K/uL (ref 0.7–4.0)
MCH: 27.5 pg (ref 26.0–34.0)
MCHC: 31.6 g/dL (ref 30.0–36.0)
MCV: 86.9 fL (ref 80.0–100.0)
Monocytes Absolute: 0.9 K/uL (ref 0.1–1.0)
Monocytes Relative: 11 %
Neutro Abs: 4.2 K/uL (ref 1.7–7.7)
Neutrophils Relative %: 54 %
Platelets: 207 K/uL (ref 150–400)
RBC: 4.59 MIL/uL (ref 4.22–5.81)
RDW: 15.9 % — ABNORMAL HIGH (ref 11.5–15.5)
WBC: 7.8 K/uL (ref 4.0–10.5)
nRBC: 0 % (ref 0.0–0.2)

## 2023-12-29 LAB — MAGNESIUM: Magnesium: 1.9 mg/dL (ref 1.7–2.4)

## 2023-12-29 LAB — CBG MONITORING, ED
Glucose-Capillary: 72 mg/dL (ref 70–99)
Glucose-Capillary: 85 mg/dL (ref 70–99)

## 2023-12-29 LAB — VITAMIN B12: Vitamin B-12: 456 pg/mL (ref 180–914)

## 2023-12-29 LAB — TSH: TSH: 1.986 u[IU]/mL (ref 0.350–4.500)

## 2023-12-29 MED ORDER — TAMSULOSIN HCL 0.4 MG PO CAPS
0.4000 mg | ORAL_CAPSULE | Freq: Every day | ORAL | Status: DC
  Administered 2023-12-29 – 2023-12-30 (×4): 0.4 mg via ORAL
  Filled 2023-12-29 (×2): qty 1

## 2023-12-29 MED ORDER — QUETIAPINE FUMARATE 25 MG PO TABS
50.0000 mg | ORAL_TABLET | Freq: Every day | ORAL | Status: DC
  Administered 2023-12-29 (×2): 50 mg via ORAL
  Filled 2023-12-29: qty 2

## 2023-12-29 MED ORDER — QUETIAPINE FUMARATE 25 MG PO TABS
50.0000 mg | ORAL_TABLET | Freq: Once | ORAL | Status: AC
  Administered 2023-12-29 (×2): 50 mg via ORAL
  Filled 2023-12-29: qty 2

## 2023-12-29 MED ORDER — STROKE: EARLY STAGES OF RECOVERY BOOK
Freq: Once | Status: AC
  Filled 2023-12-29 (×2): qty 1

## 2023-12-29 MED ORDER — ASPIRIN 81 MG PO TBEC
81.0000 mg | DELAYED_RELEASE_TABLET | Freq: Every day | ORAL | Status: DC
  Administered 2023-12-29 – 2023-12-30 (×4): 81 mg via ORAL
  Filled 2023-12-29 (×2): qty 1

## 2023-12-29 MED ORDER — PANTOPRAZOLE SODIUM 40 MG PO TBEC
40.0000 mg | DELAYED_RELEASE_TABLET | Freq: Every day | ORAL | Status: DC
  Administered 2023-12-29 – 2023-12-30 (×4): 40 mg via ORAL
  Filled 2023-12-29 (×2): qty 1

## 2023-12-29 MED ORDER — FINASTERIDE 5 MG PO TABS
5.0000 mg | ORAL_TABLET | Freq: Every day | ORAL | Status: DC
  Administered 2023-12-29 – 2023-12-30 (×4): 5 mg via ORAL
  Filled 2023-12-29 (×2): qty 1

## 2023-12-29 MED ORDER — ATORVASTATIN CALCIUM 40 MG PO TABS
40.0000 mg | ORAL_TABLET | Freq: Every evening | ORAL | Status: DC
  Administered 2023-12-29 – 2023-12-30 (×4): 40 mg via ORAL
  Filled 2023-12-29 (×2): qty 1

## 2023-12-29 MED ORDER — CARVEDILOL 3.125 MG PO TABS
3.1250 mg | ORAL_TABLET | Freq: Two times a day (BID) | ORAL | Status: DC
  Administered 2023-12-29 – 2023-12-30 (×6): 3.125 mg via ORAL
  Filled 2023-12-29 (×3): qty 1

## 2023-12-29 MED ORDER — CLOPIDOGREL BISULFATE 75 MG PO TABS
75.0000 mg | ORAL_TABLET | Freq: Every day | ORAL | Status: DC
Start: 2023-12-29 — End: 2023-12-31
  Administered 2023-12-29 – 2023-12-30 (×4): 75 mg via ORAL
  Filled 2023-12-29 (×2): qty 1

## 2023-12-29 NOTE — Progress Notes (Signed)
 VASCULAR LAB    Carotid duplex has been performed.  See CV proc for preliminary results.   Galvin Aversa, RVT 12/29/2023, 3:20 PM

## 2023-12-29 NOTE — Progress Notes (Signed)
 EEG complete - results pending

## 2023-12-29 NOTE — Progress Notes (Signed)
 SLP Cancellation Note  Patient Details Name: Dennis [name] MRN: [id number] DOB: 1946-05-08   Cancelled treatment:       Reason Eval/Treat Not Completed: Pt is known to SLP service from previous admissions and has a history of expressive language and cognitive deficits secondary to prior CVA and dementia (oriented to self only at baseline per neurology note). MRI was negative this admission. Recommend ongoing SLP f/u at pt's next venue of care. SLP interventions are not warranted acutely, will sign off.    Damien Blumenthal, M.A., CCC-SLP Speech Language Pathology, Acute Rehabilitation Services  Secure Chat preferred [phone]  12/29/2023, 11:41 AM

## 2023-12-29 NOTE — Evaluation (Addendum)
 Physical Therapy Evaluation & Discharge Patient Details Name: Dennis [name] MRN: [id number] DOB: [birth year] Today's Date: 12/29/2023  History of Present Illness  Pt is a 78 y/o M admitted on 12/28/23 after presenting with c/o new onset of slurred speech & expressive aphasia. Head imaging was negative for acute issues. PMH: stroke, dementia, essential pretension, HLD  Clinical Impression  Pt seen for PT evaluation with pt agreeable. Pt pleasantly confused throughout session, following multimodal cuing with extra time. Pt is ale to ambulate with RW & supervision, then without AD with CGA<>intermittent supervision. At this time, pt does not appear to require acute PT services, but encourage pt to mobilize with nursing staff or mobility specialists. PT to complete current orders at this time, please re-consult if new needs arise.        If plan is discharge home, recommend the following: Supervision due to cognitive status;A little help with walking and/or transfers;A little help with bathing/dressing/bathroom   Can travel by private vehicle        Equipment Recommendations Rolling walker (2 wheels)  Recommendations for Other Services       Functional Status Assessment Patient has not had a recent decline in their functional status     Precautions / Restrictions Precautions Precautions: Fall Restrictions Weight Bearing Restrictions Per Provider Order: No      Mobility  Bed Mobility Overal bed mobility: Needs Assistance Bed Mobility: Sit to Supine       Sit to supine: Modified independent (Device/Increase time), HOB elevated        Transfers Overall transfer level: Needs assistance Equipment used: Rolling walker (2 wheels) Transfers: Sit to/from Stand Sit to Stand: Supervision                Ambulation/Gait Ambulation/Gait assistance: Supervision, Contact guard assist Gait Distance (Feet): 450 Feet Assistive device: Rolling walker (2 wheels), None Gait  Pattern/deviations: Decreased step length - right, Decreased step length - left, Decreased stride length Gait velocity: variable     General Gait Details: Pt ambulates to end of hallway & back with RW & supervision, transitioned to ambulating without AD with CGA<>intermittent close supervision.  Stairs            Wheelchair Mobility     Tilt Bed    Modified Rankin (Stroke Patients Only)       Balance Overall balance assessment: Needs assistance Sitting-balance support: Feet supported Sitting balance-Leahy Scale: Good     Standing balance support: During functional activity, No upper extremity supported Standing balance-Leahy Scale: Fair                               Pertinent Vitals/Pain Pain Assessment Pain Assessment: PAINAD Breathing: normal Negative Vocalization: none Facial Expression: smiling or inexpressive Body Language: relaxed Consolability: no need to console PAINAD Score: 0    Home Living Family/patient expects to be discharged to:: Skilled nursing facility                   Additional Comments: Per chart pt from [facility] nursing facilit    Prior Function Prior Level of Function : Patient poor historian/Family not available                     Extremity/Trunk Assessment   Upper Extremity Assessment Upper Extremity Assessment: Overall WFL for tasks assessed    Lower Extremity Assessment Lower Extremity Assessment: Overall WFL for tasks assessed  Communication   Communication Communication: Impaired Factors Affecting Communication: Reduced clarity of speech;Hearing impaired;Difficulty expressing self    Cognition Arousal: Alert     PT - Cognitive impairments: History of cognitive impairments                       PT - Cognition Comments: Per chart, pt with hx of dementia, unsure if pt presents with expressive aphasia or he's limited by impaired cognition effecting his communication.  Pt follows simple commands with multimodal cuing PRN & extra time. Following commands: Impaired Following commands impaired: Follows one step commands with increased time, Follows one step commands inconsistently     Cueing Cueing Techniques: Verbal cues, Gestural cues, Tactile cues, Visual cues     General Comments      Exercises     Assessment/Plan    PT Assessment Patient does not need any further PT services  PT Problem List         PT Treatment Interventions      PT Goals (Current goals can be found in the Care Plan section)  Acute Rehab PT Goals PT Goal Formulation: Patient unable to participate in goal setting Time For Goal Achievement: 01/12/24 Potential to Achieve Goals: Good    Frequency       Co-evaluation               AM-PAC PT 6 Clicks Mobility  Outcome Measure Help needed turning from your back to your side while in a flat bed without using bedrails?: None Help needed moving from lying on your back to sitting on the side of a flat bed without using bedrails?: None Help needed moving to and from a bed to a chair (including a wheelchair)?: A Little Help needed standing up from a chair using your arms (e.g., wheelchair or bedside chair)?: None Help needed to walk in hospital room?: A Little Help needed climbing 3-5 steps with a railing? : A Little 6 Click Score: 21    End of Session   Activity Tolerance: Patient tolerated treatment well Patient left: in bed;with call bell/phone within reach;with bed alarm set Nurse Communication: Mobility status      Time: 8576-8564 PT Time Calculation (min) (ACUTE ONLY): 12 min   Charges:   PT Evaluation $PT Eval Moderate Complexity: 1 Mod   PT General Charges $$ ACUTE PT VISIT: 1 Visit         [name], PT, DPT 12/29/23, 2:41 PM   [name] 12/29/2023, 2:39 PM

## 2023-12-29 NOTE — Progress Notes (Signed)
 Mobility Specialist Progress Note:   12/29/23 1037  Mobility  Activity Ambulated with assistance  Level of Assistance Independent after set-up  Assistive Device None  Distance Ambulated (ft) 300 ft  Activity Response Tolerated well  Mobility Referral Yes  Mobility visit 1 Mobility  Mobility Specialist Start Time (ACUTE ONLY) 1030  Mobility Specialist Stop Time (ACUTE ONLY) 1037  Mobility Specialist Time Calculation (min) (ACUTE ONLY) 7 min   Pt received in bed, agreeable to mobility session. Ambulated in hallway with RW and CGA via gait belt. Pt slightly impulsive but able to follow safety cues. VSS throughout. Returned pt to room, lying in bed with all needs met, call bell and alarm on.   [name] Mobility Specialist Please contact via Special educational needs teacher or  Rehab office at [phone]

## 2023-12-29 NOTE — ED Notes (Signed)
 Called to alert nurse patient was coming

## 2023-12-29 NOTE — Progress Notes (Signed)
 Progress Note   Patient: Dennis [name] FMW:[id number] DOB: [birth year] DOA: 12/28/2023     0 DOS: the patient was seen and examined on 12/29/2023   Brief hospital course: 78 year old man with PMH of prior stroke, dementia, HTN, HLD admitted to [facility] on 12/28/2023 with strokelike symptoms after presenting from SNF to [facility] ED with reports of slurred speech (noted by nursing staff).  Patient also had altered mental status. Presented to [facility] as a stroke rule out.  Assessment and Plan:  Slurred speech  Concern for stroke. Patient was seen by neurology. MRI showed no acute intracranial abnormality. CTA showed some atherosclerotic changes in the proximal right and left cervical ICA, prompting further evaluation of vasculature. CT venogram of the head was negative for dural venous sinus thrombosis. MR angiogram is pending. EEG was done and showed mild to moderate diffuse encephalopathy with no seizures or epileptiform discharges. - Continue current workup. -Patient has been started on Plavix .   Acute encephalopathy Unclear etiology. No evidence of UTI. Chest x-ray not suggestive of pneumonia. Ethanol negative.  TSH within normal limits.  B12 within normal limits.  UDS pending. - Will continue to evaluate. - Reorient as needed. - Resume home Seroquel .  Dementia - Continue home Seroquel .  Hypertension Coreg  at home. - Resume home Coreg .  BPH - Continue home Proscar , Flomax .     Subjective: Patient is not oriented to place or time.  He keeps speaking about his sisters and his mother and large amounts of money.  Physical Exam: Vitals:   12/29/23 0634 12/29/23 0824 12/29/23 1152 12/29/23 1625  BP: (!) 157/87 (!) 162/65 (!) 142/56 (!) 154/76  Pulse: 64 76 66 73  Resp: 18 19 18 17   Temp: 97.6 F (36.4 C) 97.8 F (36.6 C) 97.7 F (36.5 C) 97.8 F (36.6 C)  TempSrc: Oral Oral Oral Oral  SpO2: 96% 94% 96% 95%  Weight:      Height:       Physical Exam   General:  Alert, oriented X1  Eyes: Pupils equal, reactive  Oral cavity: moist mucous membranes  Head: Atraumatic, normocephalic  Neck: supple  Chest: clear to auscultation. No crackles, no wheezes  CVS: S1,S2 RRR. No murmurs  Abd: No distention, soft, non-tender. No masses palpable  Extr: No edema   MSK: No joint deformities or swelling  Neurological: Grossly intact.    Data Reviewed:      Latest Ref Rng & Units 12/29/2023    2:00 AM 12/28/2023    1:57 PM 12/28/2023    1:53 PM  CBC  WBC 4.0 - 10.5 K/uL 7.8   8.1   Hemoglobin 13.0 - 17.0 g/dL 87.3  87.0  87.5   Hematocrit 39.0 - 52.0 % 39.9  38.0  38.6   Platelets 150 - 400 K/uL 207   213       Latest Ref Rng & Units 12/29/2023    2:00 AM 12/28/2023    1:57 PM 12/28/2023    1:53 PM  BMP  Glucose 70 - 99 mg/dL 82  89  87   BUN 8 - 23 mg/dL 18  29  21    Creatinine 0.61 - 1.24 mg/dL 8.93  8.79  8.82   Sodium 135 - 145 mmol/L 139  139  139   Potassium 3.5 - 5.1 mmol/L 4.4  5.5  5.4   Chloride 98 - 111 mmol/L 107  108  106   CO2 22 - 32 mmol/L 24   22  Calcium  8.9 - 10.3 mg/dL 9.2   9.1      Family Communication: n/a  Disposition: Status is: Observation   Planned Discharge Destination: Skilled nursing facility    Time spent: 35 minutes  Author: MDALA-GAUSI, GOLDEN PILLOW, MD 12/29/2023 5:34 PM  For on call review [email or web address].

## 2023-12-29 NOTE — Progress Notes (Addendum)
 NEUROLOGY CONSULT FOLLOW UP NOTE   Date of service: December 29, 2023 Patient Name: Dennis [name] MRN:  [id number] DOB:  1945/08/25  Interval Hx/subjective  Patient has been hemodynamically stable and afebrile overnight.  He is oriented to self only and disoriented to place, time and situation, but apparently this is his baseline.  No aphasia noted on exam.  Vitals   Vitals:   12/29/23 0330 12/29/23 0601 12/29/23 0634 12/29/23 0824  BP: (!) 151/49 (!) 158/73 (!) 157/87 (!) 162/65  Pulse: 64 61 64 76  Resp: 15 18 18 19   Temp:  97.7 F (36.5 C) 97.6 F (36.4 C) 97.8 F (36.6 C)  TempSrc:  Oral Oral Oral  SpO2: 94% 95% 96% 94%  Weight:      Height:         Body mass index is 23.48 kg/m.  Physical Exam   Constitutional: Appears well-developed and well-nourished.  Psych: Affect appropriate to situation.  Eyes: No scleral injection.  HENT: No OP obstrucion.  Head: Normocephalic.  Cardiovascular: Normal rate and regular rhythm.  Respiratory: Effort normal, non-labored breathing.  Skin: WDI.   Neurologic Examination    NEURO:  Mental Status: Patient is able to state name but is disoriented to place, time and situation, perseverates on events which happened in his past Speech/Language: speech is without dysarthria or aphasia.    Cranial Nerves:  II: PERRL.  III, IV, VI: EOMI. Eyelids elevate symmetrically.  V: Sensation is intact to light touch and symmetrical to face.  VII: Smile is symmetrical.  VIII: hearing intact to voice. IX, X: Phonation is normal.  XII: tongue is midline without fasciculations. Motor: Able to move all 4 extremities with good antigravity strength, no drift in the lower extremities Tone: is normal and bulk is normal Sensation- Intact to light touch bilaterally.  Gait- deferred   Medications  Current Facility-Administered Medications:    acetaminophen  (TYLENOL ) tablet 650 mg, 650 mg, Oral, Q6H PRN **OR** acetaminophen  (TYLENOL ) suppository 650  mg, 650 mg, Rectal, Q6H PRN, Howerter, Justin B, DO   aspirin  EC tablet 81 mg, 81 mg, Oral, Daily, Howerter, Justin B, DO   atorvastatin  (LIPITOR ) tablet 40 mg, 40 mg, Oral, QPM, Howerter, Justin B, DO   finasteride  (PROSCAR ) tablet 5 mg, 5 mg, Oral, Daily, Howerter, Justin B, DO   melatonin tablet 3 mg, 3 mg, Oral, QHS PRN, Howerter, Justin B, DO   midazolam  (VERSED ) injection 1 mg, 1 mg, Intravenous, Once PRN, Remi Pippin, NP   ondansetron  (ZOFRAN ) injection 4 mg, 4 mg, Intravenous, Q6H PRN, Howerter, Justin B, DO   pantoprazole  (PROTONIX ) EC tablet 40 mg, 40 mg, Oral, Daily, Mdala-Gausi, Masiku Agatha, MD   tamsulosin  (FLOMAX ) capsule 0.4 mg, 0.4 mg, Oral, Daily, Howerter, Justin B, DO  Labs and Diagnostic Imaging   CBC:  Recent Labs  Lab 12/28/23 1353 12/28/23 1357 12/29/23 0200  WBC 8.1  --  7.8  NEUTROABS 4.8  --  4.2  HGB 12.4* 12.9* 12.6*  HCT 38.6* 38.0* 39.9  MCV 87.5  --  86.9  PLT 213  --  207    Basic Metabolic Panel:  Lab Results  Component Value Date   NA 139 12/29/2023   K 4.4 12/29/2023   CO2 24 12/29/2023   GLUCOSE 82 12/29/2023   BUN 18 12/29/2023   CREATININE 1.06 12/29/2023   CALCIUM  9.2 12/29/2023   GFRNONAA >60 12/29/2023   GFRAA  11/20/2006    >60  The eGFR has been calculated using the MDRD equation. This calculation has not been validated in all clinical   Lipid Panel:  Lab Results  Component Value Date   LDLCALC 112 (H) 06/21/2023   HgbA1c:  Lab Results  Component Value Date   HGBA1C 5.4 06/21/2023   Urine Drug Screen:     Component Value Date/Time   LABOPIA NONE DETECTED 06/21/2023 0047   COCAINSCRNUR NONE DETECTED 06/21/2023 0047   LABBENZ NONE DETECTED 06/21/2023 0047   AMPHETMU NONE DETECTED 06/21/2023 0047   THCU NONE DETECTED 06/21/2023 0047   LABBARB NONE DETECTED 06/21/2023 0047    Alcohol  Level     Component Value Date/Time   ETH <15 12/28/2023 1352   INR  Lab Results  Component Value Date   INR 1.1  12/28/2023   APTT  Lab Results  Component Value Date   APTT 27 12/28/2023    CT Head without contrast: No acute abnormality, atrophy and chronic small vessel ischemic changes, remote infarct in posterior right frontal lobe  MRI Brain (Personally reviewed): No acute abnormality, atrophy and chronic microvascular ischemic changes  rEEG:  Pending  Assessment   Dennis [name] is a 78 y.o. male with history of stroke, dementia and arthritis who presented from his facility with expressive aphasia and difficulty ambulating.  At baseline, he is oriented to person only.  On admission, he was noted to have garbled speech, but this has now resolved.  Speech is fluent and content relates to events of his past, although with poor memory.  Bilateral lower extremity weakness also seen on admission has resolved.  It is likely that patient had a TIA.  Given history of stroke and dementia, patient certainly is at risk for seizures, will obtain an EEG. CTV negative.   Recommendations  - Maintain normotension - CTA head and neck - TTE  - Check A1c and LDL + add statin per guidelines - Aspirin  81 mg daily and Plavix  75 mg daily antiplt/anticoag - q4 hr neuro checks - STAT head CT for any change in neuro exam - Tele - PT/OT/SLP - Stroke education - Amb referral to neurology upon discharge    Addendum:  - EEG: Continuous slow, generalized. This study is suggestive of mild to moderate diffuse encephalopathy. No seizures or epileptiform discharges were seen throughout the recording. - Stroke Team to follow tomorrow morning   ______________________________________________________________________ Patient seen by NP Signed, Cortney E Everitt Clint Kill, NP [facility]   Electronically signed: Dr. Tryniti Laatsch

## 2023-12-29 NOTE — Procedures (Signed)
 Patient Name: Dennis [name]  MRN: [id number]  Epilepsy Attending: Arlin MALVA Krebs  Referring Physician/Provider: Patt Alm Macho, MD  Date: 12/29/2023 Duration: 23.20 mins  Patient history: 78yo M with ams. EEG to evaluate for seizure  Level of alertness: Awake  AEDs during EEG study: None  Technical aspects: This EEG study was done with scalp electrodes positioned according to the 10-20 International system of electrode placement. Electrical activity was reviewed with band pass filter of 1-70Hz , sensitivity of 7 uV/mm, display speed of 38mm/sec with a 60Hz  notched filter applied as appropriate. EEG data were recorded continuously and digitally stored.  Video monitoring was available and reviewed as appropriate.  Description: The posterior dominant rhythm consists of 7 Hz activity of moderate voltage (25-35 uV) seen predominantly in posterior head regions, symmetric and reactive to eye opening and eye closing. EEG showed continuous generalized 3 to 6 Hz theta-delta slowing.Hyperventilation and photic stimulation were not performed.     ABNORMALITY - Continuous slow, generalized  IMPRESSION: This study is suggestive of mild to moderate diffuse encephalopathy. No seizures or epileptiform discharges were seen throughout the recording.  Freya Zobrist O Maecy Podgurski

## 2023-12-29 NOTE — Progress Notes (Signed)
 Patient received to 4E07. VS taken, heart monitor applied and CCMD notified. CHG done. Patient is confused as to date, time, place. Alert to self only. Bed alarm set for safety. Vitals are stable at this time. Patient is pleasant but has mildly slurred speech. No complaints of pain.

## 2023-12-29 NOTE — Progress Notes (Signed)
 SLP Cancellation Note  Patient Details Name: Dennis [name] MRN: [id number] DOB: July 31, 1945   Cancelled treatment:       Reason Eval/Treat Not Completed: Pt is known to SLP service from previous admissions and has a history of expressive language and cognitive deficits secondary to prior CVA and dementia (oriented to self only at baseline per neurology note). MRI was negative this admission. Recommend ongoing SLP f/u at pt's next venue of care. SLP interventions are not warranted acutely, will sign off.    Damien Blumenthal, M.A., CCC-SLP Speech Language Pathology, Acute Rehabilitation Services  Secure Chat preferred [phone]  12/29/2023, 11:41 AM

## 2023-12-29 NOTE — ED Notes (Addendum)
 Patient woke up yelling was confused , reoriented patient to place and time , states he didn't know where he was doesn't remember coming her and doesn't remember where he lives, concerned that his family hasn't been here. Again patient reoriented , fall precautions in place. Attempted to do NIH, patient was mad and refused to follow any commands he could just wouldn;t

## 2023-12-29 NOTE — Care Management Obs Status (Signed)
 MEDICARE OBSERVATION STATUS NOTIFICATION   Patient Details  Name: Dennis [name] MRN: [id number] Date of Birth: [birth year]   Medicare Observation Status Notification Given:  Yes    Vonzell Arrie Sharps 12/29/2023, 9:29 AM

## 2023-12-30 ENCOUNTER — Observation Stay (HOSPITAL_BASED_OUTPATIENT_CLINIC_OR_DEPARTMENT_OTHER)

## 2023-12-30 DIAGNOSIS — R471 Dysarthria and anarthria: Secondary | ICD-10-CM | POA: Diagnosis not present

## 2023-12-30 DIAGNOSIS — R079 Chest pain, unspecified: Secondary | ICD-10-CM | POA: Diagnosis not present

## 2023-12-30 DIAGNOSIS — R55 Syncope and collapse: Secondary | ICD-10-CM | POA: Diagnosis not present

## 2023-12-30 DIAGNOSIS — G459 Transient cerebral ischemic attack, unspecified: Secondary | ICD-10-CM

## 2023-12-30 DIAGNOSIS — F0390 Unspecified dementia without behavioral disturbance: Secondary | ICD-10-CM | POA: Diagnosis not present

## 2023-12-30 DIAGNOSIS — R29818 Other symptoms and signs involving the nervous system: Secondary | ICD-10-CM | POA: Diagnosis not present

## 2023-12-30 DIAGNOSIS — I6930 Unspecified sequelae of cerebral infarction: Secondary | ICD-10-CM | POA: Diagnosis not present

## 2023-12-30 DIAGNOSIS — I349 Nonrheumatic mitral valve disorder, unspecified: Secondary | ICD-10-CM | POA: Diagnosis not present

## 2023-12-30 LAB — ECHOCARDIOGRAM COMPLETE
AR max vel: 2.75 cm2
AV Area VTI: 2.92 cm2
AV Area mean vel: 2.76 cm2
AV Mean grad: 6 mmHg
AV Peak grad: 11.8 mmHg
Ao pk vel: 1.72 m/s
Area-P 1/2: 2.16 cm2
Calc EF: 49.3 %
Height: 65 in
S' Lateral: 3.87 cm
Single Plane A2C EF: 52.8 %
Single Plane A4C EF: 47.8 %
Weight: 2127 [oz_av]

## 2023-12-30 LAB — CBC
HCT: 41.9 % (ref 39.0–52.0)
Hemoglobin: 13.2 g/dL (ref 13.0–17.0)
MCH: 27.6 pg (ref 26.0–34.0)
MCHC: 31.5 g/dL (ref 30.0–36.0)
MCV: 87.5 fL (ref 80.0–100.0)
Platelets: 196 K/uL (ref 150–400)
RBC: 4.79 MIL/uL (ref 4.22–5.81)
RDW: 15.7 % — ABNORMAL HIGH (ref 11.5–15.5)
WBC: 8.6 K/uL (ref 4.0–10.5)
nRBC: 0 % (ref 0.0–0.2)

## 2023-12-30 LAB — LIPID PANEL
Cholesterol: 95 mg/dL (ref 0–200)
HDL: 33 mg/dL — ABNORMAL LOW (ref >40)
LDL Cholesterol: 55 mg/dL (ref 0–99)
Total CHOL/HDL Ratio: 2.9 ratio
Triglycerides: 36 mg/dL (ref <150)
VLDL: 7 mg/dL (ref 0–40)

## 2023-12-30 LAB — BASIC METABOLIC PANEL WITH GFR
Anion gap: 8 (ref 5–15)
BUN: 20 mg/dL (ref 8–23)
CO2: 24 mmol/L (ref 22–32)
Calcium: 9.1 mg/dL (ref 8.9–10.3)
Chloride: 107 mmol/L (ref 98–111)
Creatinine, Ser: 1.06 mg/dL (ref 0.61–1.24)
GFR, Estimated: >60 mL/min (ref >60)
Glucose, Bld: 93 mg/dL (ref 70–99)
Potassium: 4.1 mmol/L (ref 3.5–5.1)
Sodium: 139 mmol/L (ref 135–145)

## 2023-12-30 MED ORDER — ASPIRIN 81 MG PO TBEC: DELAYED_RELEASE_TABLET | ORAL | Status: DC

## 2023-12-30 MED ORDER — CLOPIDOGREL BISULFATE 75 MG PO TABS: 75.0000 mg | ORAL_TABLET | Freq: Every day | ORAL | 3 refills | Status: AC

## 2023-12-30 NOTE — Discharge Summary (Addendum)
 Physician Discharge Summary   Patient: Dennis [name] MRN: [id number] DOB: [birth year]  Admit date:     12/28/2023  Discharge date: 12/30/23  Discharge Physician: MDALA-GAUSI, GOLDEN PILLOW   PCP: Pcp, No   Recommendations at discharge:    Follow up with Neurology  Discharge Diagnoses: Principal Problem:   Dysarthria Active Problems:   HLD (hyperlipidemia)   BPH (benign prostatic hyperplasia)   Expressive aphasia   Acute encephalopathy   History of essential hypertension   Anemia of chronic disease  Resolved Problems:   * No resolved hospital problems. *  Hospital Course: 78 year old man with PMH of prior stroke, dementia, HTN, HLD admitted to [facility] on 12/28/2023 with strokelike symptoms after presenting from SNF to [facility] ED with reports of slurred speech (noted by nursing staff).  Patient also had altered mental status. Presented to [facility] as a stroke rule out.  Acute stroke was subsequently ruled out.  The hospital course is in problem-based format below:    Assessment and Plan:  Slurred speech Concern for stroke. Patient was seen by neurology. MRI showed no acute intracranial abnormality. CTA showed some atherosclerotic changes in the proximal right and left cervical ICA, prompting further evaluation of vasculature. CT venogram of the head was negative for dural venous sinus thrombosis. MR angiogram was negative.  No large vessel occlusion or hemodynamically significant stenosis. EEG was done and showed mild to moderate diffuse encephalopathy with no seizures or epileptiform discharges. TTE done, read pending at discharge.  Patient has been started on Plavix . He is to continue taking aspirin  and plavix  for 3 weeks, and then discontinue aspirin  and continue with plavix .  Neurology recommended outpatient follow-up in clinic.   Acute encephalopathy Unclear etiology. No evidence of UTI. Chest x-ray not suggestive of pneumonia. Ethanol negative.  TSH within normal  limits.  B12 within normal limits.  UDS pending. Mental status now back to baseline. Home Seroquel  was resumed at lower dose. Home cimetidine was discontinued, as this has been associated with episodes of confusion in older adults.    Dementia Reoriented as needed. Continued home Seroquel .   Hypertension Home Coreg  was continued.   BPH Continued home Proscar , Flomax .     Consultants: Neurology Procedures performed: EEG Disposition: Assisted living Diet recommendation:  Regular diet DISCHARGE MEDICATION: Allergies as of 12/30/2023       Reactions   Codeine    Shellfish Allergy         Medication List     STOP taking these medications    cimetidine 200 MG tablet Commonly known as: TAGAMET       TAKE these medications    aspirin  EC 81 MG tablet Take one tablet daily till 01/20/2024 and then discontinue. What changed:  how much to take how to take this when to take this additional instructions   atorvastatin  40 MG tablet Commonly known as: LIPITOR  Take 1 tablet (40 mg total) by mouth daily. What changed: when to take this   carvedilol  3.125 MG tablet Commonly known as: COREG  Take 1 tablet (3.125 mg total) by mouth 2 (two) times daily with a meal.   clopidogrel  75 MG tablet Commonly known as: PLAVIX  Take 1 tablet (75 mg total) by mouth daily. Start taking on: December 31, 2023   docusate sodium  100 MG capsule Commonly known as: COLACE Take 1 capsule (100 mg total) by mouth 2 (two) times daily.   finasteride  5 MG tablet Commonly known as: PROSCAR  Take 1 tablet (5 mg total) by  mouth daily. What changed: when to take this   FLUoxetine  20 MG capsule Commonly known as: PROZAC  Take 20 mg by mouth in the morning.   folic acid  1 MG tablet Commonly known as: FOLVITE  Take 1 tablet (1 mg total) by mouth daily. What changed: when to take this   pantoprazole  40 MG tablet Commonly known as: PROTONIX  Take 1 tablet (40 mg total) by mouth daily. What  changed: when to take this   polyethylene glycol 17 g packet Commonly known as: MIRALAX  / GLYCOLAX  Take 17 g by mouth daily as needed.   QUEtiapine  50 MG tablet Commonly known as: SEROQUEL  Take 1 tablet (50 mg total) by mouth at bedtime. What changed:  when to take this Another medication with the same name was removed. Continue taking this medication, and follow the directions you see here.   senna-docusate 8.6-50 MG tablet Commonly known as: Senokot-S Take 1 tablet by mouth at bedtime as needed for mild constipation.   tamsulosin  0.4 MG Caps capsule Commonly known as: FLOMAX  Take 1 capsule (0.4 mg total) by mouth daily. What changed: when to take this        Discharge Exam: Filed Weights   12/28/23 1300 12/28/23 2036 12/30/23 0500  Weight: 64 kg 64 kg 60.3 kg   Physical Exam   General: Alert, oriented X 1  Eyes: Pupils equal, reactive  Oral cavity: moist mucous membranes  Head: Atraumatic, normocephalic  Neck: supple  Chest: clear to auscultation. No crackles, no wheezes  CVS: S1,S2 RRR. No murmurs  Abd: No distention, soft, non-tender. No masses palpable  Extr: No edema   MSK: No joint deformities or swelling  Neurological: Grossly intact.    Condition at discharge: stable  The results of significant diagnostics from this hospitalization (including imaging, microbiology, ancillary and laboratory) are listed below for reference.   Imaging Studies: MR ANGIO HEAD WO CONTRAST Result Date: 12/30/2023 CLINICAL DATA:  Initial evaluation for stroke/TIA. EXAM: MRA HEAD WITHOUT CONTRAST TECHNIQUE: Angiographic images of the Circle of Willis were acquired using MRA technique without intravenous contrast. COMPARISON:  Comparison made with prior brain MRI from 12/28/2023 FINDINGS: Anterior circulation: Examination moderately degraded by motion artifact. Both internal carotid arteries are patent through the siphons without stenosis or other abnormality. A1 segments patent  bilaterally. Normal anterior communicating artery complex. As a case ACA widely patent without stenosis. No M1 stenosis or occlusion. No proximal MCA branch occlusion. Distal MCA branches perfused and fairly symmetric. Posterior circulation: Both V4 segments patent without stenosis for the vertebral artery dominant. Both PICA patent at their origins. Basilar patent without stenosis. Superior cerebellar and posterior cerebral arteries patent bilaterally. Anatomic variants: None significant.  No intracranial aneurysm. Other: None. IMPRESSION: Negative intracranial MRA. No large vessel occlusion or hemodynamically significant stenosis. Electronically Signed   By: Morene Hoard M.D.   On: 12/30/2023 01:40   VAS US  CAROTID (at [facility] and [facility] only) Result Date: 12/29/2023 Carotid Arterial Duplex Study Patient Name:  Gunnard LITTIE Niblack  Date of Exam:   12/29/2023 Medical Rec #: [id number]      Accession #:    [id number] Date of Birth: 1946/05/06      Patient Gender: M Patient Age:   49 years Exam Location:  [facility] Procedure:      VAS US  CAROTID Referring Phys: EARLE DE LA TORRE --------------------------------------------------------------------------------  Indications:       Speech disturbance and Difficulty ambulating. Risk Factors:      Hypertension, hyperlipidemia, prior CVA.  Other Factors:     Dementia. Limitations        Today's exam was limited due to Patient confusion, tortuosity                    of vessels. Comparison Study:  No prior study Performing Technologist: Alberta Lis RVS  Examination Guidelines: A complete evaluation includes B-mode imaging, spectral Doppler, color Doppler, and power Doppler as needed of all accessible portions of each vessel. Bilateral testing is considered an integral part of a complete examination. Limited examinations for reoccurring indications may be performed as noted.  Right Carotid Findings: +----------+--------+--------+--------+------------------+---------+            PSV cm/sEDV cm/sStenosisPlaque DescriptionComments  +----------+--------+--------+--------+------------------+---------+ CCA Prox  48      3               heterogenous                +----------+--------+--------+--------+------------------+---------+ CCA Distal54      8               heterogenous                +----------+--------+--------+--------+------------------+---------+ ICA Prox  91      9       1-39%   calcific          Shadowing +----------+--------+--------+--------+------------------+---------+ ICA Mid   109     14                                tortuous  +----------+--------+--------+--------+------------------+---------+ ICA Distal67      9                                 tortuous  +----------+--------+--------+--------+------------------+---------+ ECA       117     2                                 tortuous  +----------+--------+--------+--------+------------------+---------+ +----------+--------+-------+--------+-------------------+           PSV cm/sEDV cmsDescribeArm Pressure (mmHG) +----------+--------+-------+--------+-------------------+ [id number]                                        +----------+--------+-------+--------+-------------------+ +---------+--------+--+--------+-+ VertebralPSV cm/s38EDV cm/s2 +---------+--------+--+--------+-+  Left Carotid Findings: +----------+--------+--------+--------+----------------------+--------+           PSV cm/sEDV cm/sStenosisPlaque Description    Comments +----------+--------+--------+--------+----------------------+--------+ CCA Prox  70      3               heterogenous                   +----------+--------+--------+--------+----------------------+--------+ CCA Distal82      12              heterogenous                   +----------+--------+--------+--------+----------------------+--------+ ICA Prox  118     15      1-39%   heterogenous                    +----------+--------+--------+--------+----------------------+--------+ ICA Mid   116     14  tortuous +----------+--------+--------+--------+----------------------+--------+ ICA Distal111     17                                    tortuous +----------+--------+--------+--------+----------------------+--------+ ECA       148     19              irregular and calcific         +----------+--------+--------+--------+----------------------+--------+ +----------+--------+--------+--------+-------------------+           PSV cm/sEDV cm/sDescribeArm Pressure (mmHG) +----------+--------+--------+--------+-------------------+ [id number]                                         +----------+--------+--------+--------+-------------------+ +---------+--------+--+--------+-+ VertebralPSV cm/s75EDV cm/s9 +---------+--------+--+--------+-+   Summary: Right Carotid: Velocities in the right ICA are consistent with a 1-39% stenosis. Left Carotid: Velocities in the left ICA are consistent with a 1-39% stenosis. Vertebrals:  Bilateral vertebral arteries demonstrate antegrade flow. Subclavians: Normal flow hemodynamics were seen in bilateral subclavian              arteries. *See table(s) above for measurements and observations.     Preliminary    EEG adult Result Date: 12/29/2023 Shelton Arlin KIDD, MD     12/29/2023 11:48 AM Patient Name: JB DULWORTH MRN: [id number] Epilepsy Attending: Arlin KIDD Shelton Referring Physician/Provider: Patt Alm Macho, MD Date: 12/29/2023 Duration: 23.20 mins Patient history: 78yo M with ams. EEG to evaluate for seizure Level of alertness: Awake AEDs during EEG study: None Technical aspects: This EEG study was done with scalp electrodes positioned according to the 10-20 International system of electrode placement. Electrical activity was reviewed with band pass filter of 1-70Hz , sensitivity of 7 uV/mm, display speed  of 31mm/sec with a 60Hz  notched filter applied as appropriate. EEG data were recorded continuously and digitally stored.  Video monitoring was available and reviewed as appropriate. Description: The posterior dominant rhythm consists of 7 Hz activity of moderate voltage (25-35 uV) seen predominantly in posterior head regions, symmetric and reactive to eye opening and eye closing. EEG showed continuous generalized 3 to 6 Hz theta-delta slowing.Hyperventilation and photic stimulation were not performed.   ABNORMALITY - Continuous slow, generalized IMPRESSION: This study is suggestive of mild to moderate diffuse encephalopathy. No seizures or epileptiform discharges were seen throughout the recording. Arlin KIDD Shelton   CT VENOGRAM HEAD Result Date: 12/28/2023 CLINICAL DATA:  Initial evaluation for dural venous sinus thrombosis. EXAM: CT VENOGRAM HEAD TECHNIQUE: Venographic phase images of the brain were obtained following the administration of intravenous contrast. Multiplanar reformats and maximum intensity projections were generated. RADIATION DOSE REDUCTION: This exam was performed according to the departmental dose-optimization program which includes automated exposure control, adjustment of the mA and/or kV according to patient size and/or use of iterative reconstruction technique. CONTRAST:  75mL OMNIPAQUE  IOHEXOL  350 MG/ML SOLN COMPARISON:  CTs from earlier the same day. FINDINGS: Normal enhancement seen throughout the superior sagittal sinus to the torcula. Transverse and sigmoid sinuses are patent as are the jugular bulbs and visualized internal jugular veins. Well-circumscribed ovoid filling defect at the junction of the right transverse and sigmoid sinuses noted, felt to be consistent with an arachnoid granulation. Straight sinus, vein of Galen, and internal cerebral veins are patent. No evidence for dural venous sinus thrombosis. No appreciable cortical vein abnormality. IMPRESSION: Negative CT  venogram. No evidence for  dural venous sinus thrombosis. Electronically Signed   By: Morene Hoard M.D.   On: 12/28/2023 21:35   MR BRAIN WO CONTRAST Result Date: 12/28/2023 CLINICAL DATA:  Initial evaluation for acute neuro deficit, stroke suspected. EXAM: MRI HEAD WITHOUT CONTRAST TECHNIQUE: Multiplanar, multiecho pulse sequences of the brain and surrounding structures were obtained without intravenous contrast. COMPARISON:  CTs from earlier the same day. FINDINGS: Brain: Diffuse prominence of the CSF containing spaces compatible generalized cerebral atrophy. Patchy and confluent T2/FLAIR hyperintensity involving the periventricular deep white matter both cerebral hemispheres, consistent with chronic small vessel ischemic disease, moderately advanced in nature. No abnormal foci of restricted diffusion to suggest acute or subacute ischemia. Gray-white matter differentiation maintained. No acute or significant chronic intracranial blood products. No mass lesion, midline shift or mass effect. Mild ventricular prominence related to global parenchymal volume loss without hydrocephalus. No extra-axial fluid collection. Pituitary gland and suprasellar region within normal limits. Vascular: Major intracranial arterial vascular flow voids are maintained. Asymmetric FLAIR hyperintensity seen involving the right transverse and sigmoid sinuses extending into the proximal right internal jugular vein, favored to reflect slow/sluggish flow. Skull and upper cervical spine: Craniocervical junction within normal limits. Bone marrow signal intensity overall within normal limits. No scalp soft tissue abnormality. Sinuses/Orbits: Globes orbital soft tissues within normal limits. Right maxillary sinus retention cyst. Paranasal sinuses are otherwise largely clear. No significant mastoid effusion. Other: None. IMPRESSION: 1. No acute intracranial abnormality. 2. Age-related cerebral atrophy with moderately advanced chronic  microvascular ischemic disease. 3. Asymmetric FLAIR hyperintensity involving the right transverse and sigmoid sinuses extending into the proximal right internal jugular vein, favored to reflect slow/sluggish flow. If there is clinical concern for possible dural sinus thrombosis, further evaluation with dedicated MRV could be performed for further evaluation. Electronically Signed   By: Morene Hoard M.D.   On: 12/28/2023 19:19   DG Chest 1 View Result Date: 12/28/2023 CLINICAL DATA:  [id number] Altered mental state 881078 EXAM: CHEST  1 VIEW COMPARISON:  Oct 08, 2023 FINDINGS: No focal airspace consolidation, pleural effusion, or pneumothorax. Mild cardiomegaly. Aortic atherosclerosis. No acute fracture or destructive lesions. Multilevel thoracic osteophytosis. IMPRESSION: No acute cardiopulmonary abnormality. Electronically Signed   By: Rogelia Myers M.D.   On: 12/28/2023 18:59   CT ANGIO HEAD NECK W WO CM (CODE STROKE) Result Date: 12/28/2023 EXAM: CTA HEAD AND NECK WITH AND WITHOUT 12/28/2023 02:11:32 PM TECHNIQUE: CTA of the head and neck was performed with and without the administration of intravenous contrast. Multiplanar 2D and/or 3D reformatted images are provided for review. Automated exposure control, iterative reconstruction, and/or weight based adjustment of the mA/kV was utilized to reduce the radiation dose to as low as reasonably achievable. Stenosis of the internal carotid arteries measured using NASCET criteria. COMPARISON: CT and CTA head and neck dated 12/28/2023. CLINICAL HISTORY: Neuro deficit, acute, stroke suspected. FINDINGS: CTA NECK: AORTIC ARCH AND ARCH VESSELS: Moderate atherosclerosis of the visualized aortic arch. Atherosclerosis of the aortic arch vessel origins without high-grade stenosis. The subclavian arteries are patent bilaterally. CERVICAL CAROTID ARTERIES: Mild atherosclerosis of the right common carotid artery. Bulky calcified atherosclerosis at the carotid  bifurcation and along the proximal cervical ICA where there is approximately 55% stenosis. There is tortuosity of the mid right cervical ICA. Mild atherosclerosis of the left common carotid artery. Atherosclerosis at the left carotid bifurcation and along the proximal left cervical ICA where there is approximately 40% stenosis. Mild tortuosity of the mid cervical ICA. CERVICAL VERTEBRAL ARTERIES: Atherosclerosis at the  right vertebral artery origin resulting in moderate stenosis. The vertebral arteries are patent from the origins to the vertebrobasilar confluence. Atherosclerosis of the left V4 segment resulting in mild stenosis. LUNGS AND MEDIASTINUM: Unremarkable. SOFT TISSUES: No acute abnormality. BONES: Degenerative changes in the visualized spine with disc space narrowing most pronounced at C4-5 through C6-7. CTA HEAD: ANTERIOR CIRCULATION: The intracranial internal carotid arteries are patent bilaterally. Atherosclerosis of the carotid siphons resulting in mild stenosis. The MCAs are patent bilaterally. The ACAs are patent bilaterally. POSTERIOR CIRCULATION: The basilar artery is patent. The PCAs are patent bilaterally. The superior cerebellar arteries are patent bilaterally. OTHER: No dural venous sinus thrombosis on this non-dedicated study. IMPRESSION: 1. No large vessel occlusion. 2. 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 3. Atherosclerosis with approximately 40% stenosis at the proximal left cervical ICA. 4. Moderate stenosis at the right vertebral artery origin. 5. Moderate atherosclerosis of the aortic arch. Electronically signed by: Donnice Mania MD 12/28/2023 02:28 PM EDT RP Workstation: [id number]   CT HEAD CODE STROKE WO CONTRAST Result Date: 12/28/2023 EXAM: CT HEAD WITHOUT 12/28/2023 01:59:30 PM TECHNIQUE: CT of the head was performed without the administration of intravenous contrast. Automated exposure control, iterative reconstruction, and/or  weight based adjustment of the mA/kV was utilized to reduce the radiation dose to as low as reasonably achievable. COMPARISON: CT head 09/16/2023 CLINICAL HISTORY: Neuro deficit, acute, stroke suspected. FINDINGS: BRAIN AND VENTRICLES: No acute intracranial hemorrhage. No mass effect or midline shift. No extra-axial fluid collection. Gray-white differentiation is maintained. No hydrocephalus. Nonspecific hypoattenuation in the periventricular and subcortical white matter, most likely representing chronic small vessel disease. Similar appearance of remote cortical infarct in the posterior right frontal lobe. Generalized parenchymal volume loss. No edema, mass effect, or midline shift. No extraaxial fluid collections. ORBITS: No acute abnormality. SINUSES AND MASTOIDS: Mucosal thickening in the right sphenoid and right maxillary sinuses. SOFT TISSUES AND SKULL: No acute skull fracture. No acute soft tissue abnormality. [place] stroke program early CT (ASPECT) score Ganglionic (caudate, IC, lentiform nucleus, insula, M1-M3): 7 Supraganglionic (M4-M6): 3 Total: 10 IMPRESSION: 1. No acute intracranial abnormality. 2. 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 3. 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 4. Findings messaged to Dr. Arora via the [facility] messaging system at 2:05PM on 12/28/23. Electronically signed by: Donnice Mania MD 12/28/2023 02:06 PM EDT RP Workstation: [id number]    Microbiology: Results for orders placed or performed during the hospital encounter of 09/16/23  MRSA Next Gen by PCR, Nasal     Status: None   Collection Time: 09/16/23  9:37 PM   Specimen: Nasal Mucosa; Nasal Swab  Result Value Ref Range Status   MRSA by PCR Next Gen NOT DETECTED NOT DETECTED Final    Comment: (NOTE) The GeneXpert MRSA Assay (FDA approved for NASAL specimens only), is one component of a comprehensive MRSA colonization surveillance program. It is not intended to  diagnose MRSA infection nor to guide or monitor treatment for MRSA infections. Test performance is not FDA approved in patients less than 87 years old. Performed at [facility], [address] [place], [place] [address]     Labs: CBC: Recent Labs  Lab 12/28/23 1353 12/28/23 1357 12/29/23 0200 12/30/23 0345  WBC 8.1  --  7.8 8.6  NEUTROABS 4.8  --  4.2  --   HGB 12.4* 12.9* 12.6* 13.2  HCT 38.6* 38.0* 39.9 41.9  MCV 87.5  --  86.9  87.5  PLT 213  --  207 196   Basic Metabolic Panel: Recent Labs  Lab 12/28/23 1352 12/28/23 1353 12/28/23 1357 12/29/23 0200 12/30/23 0345  NA  --  139 139 139 139  K  --  5.4* 5.5* 4.4 4.1  CL  --  106 108 107 107  CO2  --  22  --  24 24  GLUCOSE  --  87 89 82 93  BUN  --  21 29* 18 20  CREATININE  --  1.17 1.20 1.06 1.06  CALCIUM   --  9.1  --  9.2 9.1  MG 1.9  --   --  1.9  --    Liver Function Tests: Recent Labs  Lab 12/28/23 1353 12/29/23 0200  AST 38 28  ALT 13 15  ALKPHOS 27* 26*  BILITOT 1.3* 0.6  PROT 6.3* 6.0*  ALBUMIN 3.3* 3.0*   CBG: Recent Labs  Lab 12/28/23 1352 12/29/23 0203 12/29/23 0600  GLUCAP 87 72 85    Discharge time spent: greater than 30 minutes.  Signed: MDALA-GAUSI, Laria Grimmett AGATHA, MD [facility] 12/30/2023

## 2023-12-30 NOTE — TOC CAGE-AID Note (Signed)
 Transition of Care [facility]) - CAGE-AID Screening   Patient Details  Name: Dennis [name] MRN: [id number] Date of Birth: [birth year]  [facility]) CM/SW Contact:    Kennis Wissmann E Haakon Titsworth, LCSW Phone Number: 12/30/2023, 9:33 AM   Clinical Narrative:    CAGE-AID Screening: Substance Abuse Screening unable to be completed due to: : Patient unable to participate

## 2023-12-30 NOTE — Progress Notes (Addendum)
 STROKE TEAM PROGRESS NOTE    INTERIM HISTORY/SUBJECTIVE  Dennis [name] speech and poor insight into his condition., history of dementia. Unable to correctly name obejcts. Able to follow commands especially with mimicking. No focal deficits.  Sitting up in chair, NAD.  MRI brain negative for stroke.  MR brain angiogram no large vessel stenosis or occlusion.  Carotid ultrasound no significant extracranial stenosis Pending ECHO.   OBJECTIVE  CBC    Component Value Date/Time   WBC 8.6 12/30/2023 0345   RBC 4.79 12/30/2023 0345   HGB 13.2 12/30/2023 0345   HCT 41.9 12/30/2023 0345   PLT 196 12/30/2023 0345   MCV 87.5 12/30/2023 0345   MCH 27.6 12/30/2023 0345   MCHC 31.5 12/30/2023 0345   RDW 15.7 (H) 12/30/2023 0345   LYMPHSABS 2.4 12/29/2023 0200   MONOABS 0.9 12/29/2023 0200   EOSABS 0.3 12/29/2023 0200   BASOSABS 0.0 12/29/2023 0200    BMET    Component Value Date/Time   NA 139 12/30/2023 0345   K 4.1 12/30/2023 0345   CL 107 12/30/2023 0345   CO2 24 12/30/2023 0345   GLUCOSE 93 12/30/2023 0345   BUN 20 12/30/2023 0345   CREATININE 1.06 12/30/2023 0345   CALCIUM  9.1 12/30/2023 0345   GFRNONAA >60 12/30/2023 0345    IMAGING past 24 hours MR ANGIO HEAD WO CONTRAST Result Date: 12/30/2023 CLINICAL DATA:  Initial evaluation for stroke/TIA. EXAM: MRA HEAD WITHOUT CONTRAST TECHNIQUE: Angiographic images of the Circle of Willis were acquired using MRA technique without intravenous contrast. COMPARISON:  Comparison made with prior brain MRI from 12/28/2023 FINDINGS: Anterior circulation: Examination moderately degraded by motion artifact. Both internal carotid arteries are patent through the siphons without stenosis or other abnormality. A1 segments patent bilaterally. Normal anterior communicating artery complex. As a case ACA widely patent without stenosis. No M1 stenosis or occlusion. No proximal MCA branch occlusion. Distal MCA branches perfused and fairly symmetric. Posterior  circulation: Both V4 segments patent without stenosis for the vertebral artery dominant. Both PICA patent at their origins. Basilar patent without stenosis. Superior cerebellar and posterior cerebral arteries patent bilaterally. Anatomic variants: None significant.  No intracranial aneurysm. Other: None. IMPRESSION: Negative intracranial MRA. No large vessel occlusion or hemodynamically significant stenosis. Electronically Signed   By: Morene Hoard M.D.   On: 12/30/2023 01:40   VAS US  CAROTID (at [facility] and [facility] only) Result Date: 12/29/2023 Carotid Arterial Duplex Study Patient Name:  Dennis [name]  Date of Exam:   12/29/2023 Medical Rec #: [id number]      Accession #:    [id number] Date of Birth: 10/04/1945      Patient Gender: M Patient Age:   78 years [facility] Procedure:      VAS US  CAROTID Referring Phys: EARLE DE LA TORRE --------------------------------------------------------------------------------  Indications:       Speech disturbance and Difficulty ambulating. Risk Factors:      Hypertension, hyperlipidemia, prior CVA. Other Factors:     Dementia. Limitations        Today's exam was limited due to Patient confusion, tortuosity                    of vessels. Comparison Study:  No prior study Performing Technologist: Alberta Lis RVS  Examination Guidelines: A complete evaluation includes B-mode imaging, spectral Doppler, color Doppler, and power Doppler as needed of all accessible portions of each vessel. Bilateral testing is considered an integral part of a complete examination.  Limited examinations for reoccurring indications may be performed as noted.  Right Carotid Findings: +----------+--------+--------+--------+------------------+---------+           PSV cm/sEDV cm/sStenosisPlaque DescriptionComments  +----------+--------+--------+--------+------------------+---------+ CCA Prox  48      3               heterogenous                 +----------+--------+--------+--------+------------------+---------+ CCA Distal54      8               heterogenous                +----------+--------+--------+--------+------------------+---------+ ICA Prox  91      9       1-39%   calcific          Shadowing +----------+--------+--------+--------+------------------+---------+ ICA Mid   109     14                                tortuous  +----------+--------+--------+--------+------------------+---------+ ICA Distal67      9                                 tortuous  +----------+--------+--------+--------+------------------+---------+ ECA       117     2                                 tortuous  +----------+--------+--------+--------+------------------+---------+ +----------+--------+-------+--------+-------------------+           PSV cm/sEDV cmsDescribeArm Pressure (mmHG) +----------+--------+-------+--------+-------------------+ [id number]                                        +----------+--------+-------+--------+-------------------+ +---------+--------+--+--------+-+ VertebralPSV cm/s38EDV cm/s2 +---------+--------+--+--------+-+  Left Carotid Findings: +----------+--------+--------+--------+----------------------+--------+           PSV cm/sEDV cm/sStenosisPlaque Description    Comments +----------+--------+--------+--------+----------------------+--------+ CCA Prox  70      3               heterogenous                   +----------+--------+--------+--------+----------------------+--------+ CCA Distal82      12              heterogenous                   +----------+--------+--------+--------+----------------------+--------+ ICA Prox  118     15      1-39%   heterogenous                   +----------+--------+--------+--------+----------------------+--------+ ICA Mid   116     14                                    tortuous  +----------+--------+--------+--------+----------------------+--------+ ICA Distal111     17                                    tortuous +----------+--------+--------+--------+----------------------+--------+ ECA       148     19  irregular and calcific         +----------+--------+--------+--------+----------------------+--------+ +----------+--------+--------+--------+-------------------+           PSV cm/sEDV cm/sDescribeArm Pressure (mmHG) +----------+--------+--------+--------+-------------------+ [id number]                                         +----------+--------+--------+--------+-------------------+ +---------+--------+--+--------+-+ VertebralPSV cm/s75EDV cm/s9 +---------+--------+--+--------+-+   Summary: Right Carotid: Velocities in the right ICA are consistent with a 1-39% stenosis. Left Carotid: Velocities in the left ICA are consistent with a 1-39% stenosis. Vertebrals:  Bilateral vertebral arteries demonstrate antegrade flow. Subclavians: Normal flow hemodynamics were seen in bilateral subclavian              arteries. *See table(s) above for measurements and observations.     Preliminary    EEG adult Result Date: 12/29/2023 Shelton Arlin KIDD, MD     12/29/2023 11:48 AM Patient Name: HARRY SHUCK MRN: [id number] Epilepsy Attending: Arlin KIDD Shelton Referring Physician/Provider: Patt Alm Macho, MD Date: 12/29/2023 Duration: 23.20 mins Patient history: 78yo M with ams. EEG to evaluate for seizure Level of alertness: Awake AEDs during EEG study: None Technical aspects: This EEG study was done with scalp electrodes positioned according to the 10-20 International system of electrode placement. Electrical activity was reviewed with band pass filter of 1-70Hz , sensitivity of 7 uV/mm, display speed of 25mm/sec with a 60Hz  notched filter applied as appropriate. EEG data were recorded continuously and digitally stored.  Video monitoring was available and  reviewed as appropriate. Description: The posterior dominant rhythm consists of 7 Hz activity of moderate voltage (25-35 uV) seen predominantly in posterior head regions, symmetric and reactive to eye opening and eye closing. EEG showed continuous generalized 3 to 6 Hz theta-delta slowing.Hyperventilation and photic stimulation were not performed.   ABNORMALITY - Continuous slow, generalized IMPRESSION: This study is suggestive of mild to moderate diffuse encephalopathy. No seizures or epileptiform discharges were seen throughout the recording. Priyanka KIDD Shelton    Vitals:   12/30/23 0300 12/30/23 0305 12/30/23 0500 12/30/23 0848  BP: 130/75   (!) 162/89  Pulse: 62   63  Resp: 12 16  15   Temp: 97.6 F (36.4 C)   97.7 F (36.5 C)  TempSrc: Axillary   Oral  SpO2: 92%   92%  Weight:   60.3 kg   Height:         PHYSICAL EXAM General:  Alert, well-nourished, well-developed patient in no acute distress Psych:  Mood and affect appropriate for situation CV: Regular rate and rhythm on monitor Respiratory:  Regular, unlabored respirations on room air GI: Abdomen soft and nontender   NEURO:  Mental Status: confused, Dennis [name]  Speech/Language: follows commands, especially with mimicking. Is able to answer some simple questions, but is unable to answer two-step or more difficult questions.  Unable to provide history. Poor attention.   Cranial Nerves:  II to XII grossly intact Motor: 5/5 strength to all muscle groups tested.  Tone: is normal and bulk is normal Sensation- Intact to light touch bilaterally. Extinction absent to light touch to DSS.   Coordination: FTN intact bilaterally, HKS: no ataxia in BLE.No drift.  Gait- deferred  Most Recent NIH: 2    ASSESSMENT/PLAN  Mr. HAIDAN NHAN is a 78 y.o. male with history of previous stroke, dementia, HTN, HLD admitted for strokelike symptoms after presenting with reports of slurred speech noted by facility staff.  NIH on Admission 8  (noted at fluctuating symptoms).   Strokelike episode versus recrudescence of previous stroke symptoms in the patient with baseline significant dementia  CT Head without contrast(Personally reviewed): No acute intracranial abnormality. 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 CT angio Head and Neck with contrast(Personally reviewed): Atherosclerosis with approximately 55% stenosis at the proximal right cervical ICA. Atherosclerosis with approximately 40% stenosis at the proximal left cervical ICA. Moderate stenosis at the right vertebral artery origin. MRI No acute intracranial abnormality Age-related cerebral atrophy with moderately advanced chronic microvascular ischemic disease. MRA:  Negative.  No LVO or significant stenosis. CT venogram: Negative Carotid Doppler: Negative 2D Echo pending February 2025: EF 20-25% May 2025: EF improved to 45% Cardiology consult recommended LDL 55 HgbA1c 5.4 VTE prophylaxis -okay for Lovenox  aspirin  81 mg daily prior to admission, now on aspirin  81 mg daily and clopidogrel  75 mg daily for 3 weeks and then Plavix  alone. Therapy recommendations:  SNF Disposition: Previous facility  History of stroke 06/23/2023 MRI: Punctate acute infarct left posterior lobe white matter. Was noted to not be taking his prescribed Plavix  Discharged on Eliquis  due to low EF and concern for cardioembolic etiology Repeat echo showed EF 45% in May, Eliquis  DC'd and aspirin  81 mg started  ? Seizures Routine EEG 8/11 negative for seizures, suggestive of mild to moderate diffuse encephalopathy.  Hypertension Home meds: Carvedilol  3.125 mg twice daily Stable Blood pressure goal normotensive  Hyperlipidemia Home meds: Lipitor  40 mg, resumed in hospital LDL 55, goal < 70 Continue statin at discharge  Diabetes type II, no history Home meds: None HgbA1c 5.4, goal <  7.0 CBG  Hospital day # 0   Pt seen by Neuro NP/APP and later by MD. Note/plan to be edited by MD as needed.    Rocky JAYSON Likes, DNP, AGACNP-BC [facility] Please use AMION for contact information & EPIC for messaging.  I have personally obtained history,examined this patient, reviewed notes, independently viewed imaging studies, participated in medical decision making and plan of care.ROS completed by me personally and pertinent positives fully documented  I have made any additions or clarifications directly to the above note. Agree with note above.  Patient with baseline dementia presented with evaluation for strokelike symptoms.  MRI scan as well as MRI of the brain and carotid ultrasound are all pretty much unremarkable.  Doubt TIA versus transient worsening of her symptoms.  Continue aspirin  and Plavix  for 3 weeks followed by Plavix  alone and aggressive risk factor modification.  Check echocardiogram.  No family available at the bedside for discussion.   I personally spent a total of 50 minutes in the care of the patient today including getting/reviewing separately obtained history, performing a medically appropriate exam/evaluation, counseling and educating, placing orders, referring and communicating with other health care professionals, documenting clinical information in the EHR, independently interpreting results, and coordinating care.        Eather Popp, MD Medical Director [facility] Pager: [phone] 12/30/2023 2:37 PM   To contact Stroke Continuity provider, please refer to [email or web address]. After hours, contact General Neurology

## 2023-12-30 NOTE — Plan of Care (Signed)
  Problem: Clinical Measurements: Goal: Will remain free from infection Outcome: Progressing   Problem: Nutrition: Goal: Adequate nutrition will be maintained Outcome: Progressing   Problem: Activity: Goal: Risk for activity intolerance will decrease Outcome: Progressing   Problem: Safety: Goal: Ability to remain free from injury will improve Outcome: Progressing   Problem: Ischemic Stroke/TIA Tissue Perfusion: Goal: Complications of ischemic stroke/TIA will be minimized Outcome: Progressing   Problem: Education: Goal: Knowledge of disease or condition will improve Outcome: Progressing Goal: Knowledge of secondary prevention will improve (MUST DOCUMENT ALL) Outcome: Progressing Goal: Knowledge of patient specific risk factors will improve (DELETE if not current risk factor) Outcome: Progressing

## 2023-12-30 NOTE — TOC Transition Note (Signed)
 Transition of Care [facility]) - Discharge Note   Patient Details  Name: Dennis [name] MRN: [id number] Date of Birth: 14-Feb-1946  [facility]) CM/SW Contact:  Montie LOISE Louder, LCSW Phone Number: 12/30/2023, 2:35 PM   Clinical Narrative:     Received call from Legal Guardian/ DSS SW [place]. [place]  [name] Discharge Date: 12/30/23 Family Notified: Legal Guardian/DSS Transport By: ROME  Per MD patient is ready for discharge. RN, patient, and facility notified of discharge. Discharge Summary sent to facility. RN given number for [phone]. Ambulance transport requested for patient.   Clinical Social Worker signing off.  Montie Louder, MSW, LCSW Clinical Social Worker     Final next level of care: Assisted Living Barriers to Discharge: Barriers Resolved   Patient Goals and CMS Choice            Discharge Placement                       Discharge Plan and Services Additional resources added to the After Visit Summary for   In-house Referral: Clinical Social Work                                   Social Drivers of Health (SDOH) Interventions SDOH Screenings   Food Insecurity: No Food Insecurity (09/17/2023)  Housing: Low Risk  (09/17/2023)  Transportation Needs: No Transportation Needs (09/17/2023)  Utilities: Not At Risk (09/17/2023)  Social Connections: Moderately Isolated (09/17/2023)  Tobacco Use: Medium Risk (12/29/2023)     Readmission Risk Interventions     No data to display

## 2023-12-30 NOTE — Progress Notes (Signed)
 Report called to [facility], PIV removed, AVS placed in d/c packet, patient is waiting for PTAR, no any concerns.

## 2023-12-30 NOTE — TOC Initial Note (Signed)
 Transition of Care [name]) - Initial/Assessment Note    Patient Details  Name: RYDELL WIEGEL MRN: [id number] Date of Birth: [birth year]  Transition of Care [facility]) CM/SW Contact:    Montie LOISE Louder, LCSW Phone Number: 12/30/2023, 10:25 AM  Clinical Narrative:                  Patient is form [facility] # [phone]. CSW spoke with the Medical Teach, she advised sending message to Camelia Hsu (the person responsible for looking over paperwork) to call CSW back.   Left voice message with [facility] APS SW Supervisor ALONSO Pesa (Legal Guardian) # [phone] - waiting on call back.   Montie Louder, MSW, LCSW Clinical Social Worker    Expected Discharge Plan: Assisted Living Barriers to Discharge:  (waiting oncall back from ALF & Legal Guardian)   Patient Goals and CMS Choice            Expected Discharge Plan and Services In-house Referral: Clinical Social Work     Living arrangements for the past 2 months: Assisted Living Facility                                      Prior Living Arrangements/Services Living arrangements for the past 2 months: Assisted Living Facility Lives with:: Facility Resident Patient language and need for interpreter reviewed:: No                 Activities of Daily Living      Permission Sought/Granted Permission sought to share information with :  (pt has legal guardian)                Emotional Assessment         Alcohol  / Substance Use: Not Applicable Psych Involvement: No (comment)  Admission diagnosis:  Slurred speech [R47.81] History of stroke [Z86.73] Dysarthria [R47.1] Dementia, unspecified dementia severity, unspecified dementia type, unspecified whether behavioral, psychotic, or mood disturbance or anxiety (HCC) [F03.90] Patient Active Problem List   Diagnosis Date Noted   Expressive aphasia 12/29/2023   Acute encephalopathy 12/29/2023   History of essential hypertension  12/29/2023   Anemia of chronic disease 12/29/2023   Dysarthria 12/28/2023   Syncope 09/16/2023   AKI (acute kidney injury) (HCC) 09/16/2023   UTI (urinary tract infection) 09/16/2023   Acute metabolic encephalopathy 09/16/2023   History of stroke 09/16/2023   Agitation 07/10/2023   Dysphagia 07/02/2023   Dementia with behavioral disturbance (HCC) 07/02/2023   Acute CVA (cerebrovascular accident) (HCC) 06/21/2023   BPH (benign prostatic hyperplasia) 06/21/2023   Altered mental state 02/22/2023   Difficulty walking 02/22/2023   HLD (hyperlipidemia) 06/02/2009   HTN (hypertension) 06/02/2009   AORTIC INSUFFICIENCY, 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 ([address]), [place] - [address] [place] ([place]) [place] [address] Phone: [phone] Fax: [phone]  [facility] - [place], [place] - [address] [place] [address] Phone: [phone] Fax: [phone]     Social Drivers of Health (SDOH) Social History: SDOH Screenings   Food Insecurity: No Food Insecurity (09/17/2023)  Housing: Low Risk  (09/17/2023)  Transportation Needs: No Transportation Needs (09/17/2023)  Utilities: Not At Risk (09/17/2023)  Social Connections: Moderately Isolated (09/17/2023)  Tobacco Use:  Medium Risk (12/29/2023)   SDOH Interventions:     Readmission Risk Interventions     No data to display

## 2023-12-30 NOTE — Progress Notes (Signed)
  Echocardiogram 2D Echocardiogram has been performed.  Dennis [name] 12/30/2023, 12:00 PM

## 2023-12-30 NOTE — Evaluation (Signed)
 Occupational Therapy Evaluation Patient Details Name: Dennis [name] MRN: [id number] DOB: [birth year] Today's Date: 12/30/2023   History of Present Illness   78 y/o M admitted on 12/28/23 after presenting with c/o new onset of slurred speech & expressive aphasia. Head imaging was negative for acute issues. PMH: stroke, dementia, essential pretension, HLD     Clinical Impressions Patient evaluated by Occupational Therapy with no further acute OT needs identified. All education has been completed and the patient has no further questions. See below for any follow-up Occupational Therapy or equipment needs. OT to sign off. Thank you for referral.       If plan is discharge home, recommend the following:         Functional Status Assessment   Patient has had a recent decline in their functional status and demonstrates the ability to make significant improvements in function in a reasonable and predictable amount of time.     Equipment Recommendations   None recommended by OT     Recommendations for Other Services         Precautions/Restrictions   Precautions Precautions: Fall Restrictions Weight Bearing Restrictions Per Provider Order: Yes     Mobility Bed Mobility Overal bed mobility: Modified Independent                  Transfers Overall transfer level: Needs assistance Equipment used: Rolling walker (2 wheels) Transfers: Sit to/from Stand Sit to Stand: Modified independent (Device/Increase time)           General transfer comment: pt abandoned RW and complete transfer. pt with x1 LOB with head turn but self corrected      Balance Overall balance assessment: Needs assistance Sitting-balance support: Feet supported Sitting balance-Leahy Scale: Good     Standing balance support: During functional activity, No upper extremity supported Standing balance-Leahy Scale: Fair                             ADL either performed or  assessed with clinical judgement   ADL Overall ADL's : Modified independent                                       General ADL Comments: pt able to complete washing hands, pt don pants, pt opening containers and self feeding breakfast. pt verbalizes i can't remember anything anymore. pt expressed that he is having trouble speaking several times.     Vision Baseline Vision/History: 1 Wears glasses Vision Assessment?: No apparent visual deficits     Perception         Praxis         Pertinent Vitals/Pain Pain Assessment Pain Assessment: No/denies pain     Extremity/Trunk Assessment Upper Extremity Assessment Upper Extremity Assessment: Overall WFL for tasks assessed   Lower Extremity Assessment Lower Extremity Assessment: Overall WFL for tasks assessed   Cervical / Trunk Assessment Cervical / Trunk Assessment: Normal   Communication Communication Communication: Impaired Factors Affecting Communication: Reduced clarity of speech;Hearing impaired;Difficulty expressing self   Cognition Arousal: Alert Behavior During Therapy: WFL for tasks assessed/performed Cognition: History of cognitive impairments             OT - Cognition Comments: pt perseverating on lack of pants. pt provided paper scrubs and pt immediately able to engage in self feeding without the verbal/internal distraction  Following commands: Impaired Following commands impaired: Follows one step commands with increased time, Follows one step commands inconsistently     Cueing  General Comments   Cueing Techniques: Verbal cues;Gestural cues;Tactile cues;Visual cues  VSS on RA   Exercises     Shoulder Instructions      Home Living Family/patient expects to be discharged to:: Skilled nursing facility                                 Additional Comments: Per chart pt from [facility] nursing facilit      Prior Functioning/Environment  Prior Level of Function : Patient poor historian/Family not available             Mobility Comments: he reports using RW ADLs Comments: he reports independent    OT Problem List:     OT Treatment/Interventions:        OT Goals(Current goals can be found in the care plan section)   Acute Rehab OT Goals Patient Stated Goal: to find my pants   OT Frequency:       Co-evaluation              AM-PAC OT 6 Clicks Daily Activity     Outcome Measure Help from another person eating meals?: None Help from another person taking care of personal grooming?: None Help from another person toileting, which includes using toliet, bedpan, or urinal?: None Help from another person bathing (including washing, rinsing, drying)?: None Help from another person to put on and taking off regular upper body clothing?: None Help from another person to put on and taking off regular lower body clothing?: None 6 Click Score: 24   End of Session Equipment Utilized During Treatment: Gait belt Nurse Communication: Mobility status;Precautions  Activity Tolerance: Patient tolerated treatment well Patient left: in chair;with call bell/phone within reach;with chair alarm set (no alarm box. RN notified)                   Time: 9153-9089 OT Time Calculation (min): 24 min Charges:  OT General Charges $OT Visit: 1 Visit OT Evaluation $OT Eval Moderate Complexity: 1 Mod OT Treatments $Self Care/Home Management : 8-22 mins   Brynn, OTR/L  Acute Rehabilitation Services Office: [phone] .   Ely Molt 12/30/2023, 11:52 AM

## 2023-12-30 NOTE — Progress Notes (Addendum)
 Progress Note   Patient: Dennis [name] FMW:[id number] DOB: 1946/03/10 DOA: 12/28/2023     0 DOS: the patient was seen and examined on 12/30/2023   Brief hospital course: 78 year old man with PMH of prior stroke, dementia, HTN, HLD admitted to [facility] on 12/28/2023 with strokelike symptoms after presenting from SNF to [facility] ED with reports of slurred speech (noted by nursing staff).  Patient also had altered mental status. Presented to [facility] as a stroke rule out.  Assessment and Plan:  Slurred speech Concern for stroke. Patient was seen by neurology. MRI showed no acute intracranial abnormality. CTA showed some atherosclerotic changes in the proximal right and left cervical ICA, prompting further evaluation of vasculature. CT venogram of the head was negative for dural venous sinus thrombosis. MR angiogram is pending. EEG was done and showed mild to moderate diffuse encephalopathy with no seizures or epileptiform discharges. TTE done, read pending.  -Patient has been started on Plavix . - Will discharge back to ALF.  Acute encephalopathy Unclear etiology. No evidence of UTI. Chest x-ray not suggestive of pneumonia. Ethanol negative.  TSH within normal limits.  B12 within normal limits.  UDS pending. Mental status now back to baseline. - Reorient as needed. - Resumed home Seroquel . - will not resume PTA cimetidine, as this has been associated with episodes of confusion in older adults.   Dementia - Continue home Seroquel .  Hypertension Coreg  at home. - Resume home Coreg .  BPH - Continue home Proscar , Flomax .     Subjective: Patient mobilizing well.   Physical Exam: Vitals:   12/30/23 0305 12/30/23 0500 12/30/23 0848 12/30/23 1155  BP:   (!) 162/89 (!) 148/64  Pulse:   63 64  Resp: 16  15 14   Temp:   97.7 F (36.5 C) 98 F (36.7 C)  TempSrc:   Oral Oral  SpO2:   92% 98%  Weight:  60.3 kg    Height:       Physical Exam   General: Alert, oriented X1   Eyes: Pupils equal, reactive  Oral cavity: moist mucous membranes  Head: Atraumatic, normocephalic  Neck: supple  Chest: clear to auscultation. No crackles, no wheezes  CVS: S1,S2 RRR. No murmurs  Abd: No distention, soft, non-tender. No masses palpable  Extr: No edema   MSK: No joint deformities or swelling  Neurological: Grossly intact.    Data Reviewed:      Latest Ref Rng & Units 12/30/2023    3:45 AM 12/29/2023    2:00 AM 12/28/2023    1:57 PM  CBC  WBC 4.0 - 10.5 K/uL 8.6  7.8    Hemoglobin 13.0 - 17.0 g/dL 86.7  87.3  87.0   Hematocrit 39.0 - 52.0 % 41.9  39.9  38.0   Platelets 150 - 400 K/uL 196  207        Latest Ref Rng & Units 12/30/2023    3:45 AM 12/29/2023    2:00 AM 12/28/2023    1:57 PM  BMP  Glucose 70 - 99 mg/dL 93  82  89   BUN 8 - 23 mg/dL 20  18  29    Creatinine 0.61 - 1.24 mg/dL 8.93  8.93  8.79   Sodium 135 - 145 mmol/L 139  139  139   Potassium 3.5 - 5.1 mmol/L 4.1  4.4  5.5   Chloride 98 - 111 mmol/L 107  107  108   CO2 22 - 32 mmol/L 24  24  Calcium  8.9 - 10.3 mg/dL 9.1  9.2       Family Communication: n/a  Disposition: Status is: Observation   Planned Discharge Destination: Skilled nursing facility    Time spent: 35 minutes  Author: MDALA-GAUSI, GOLDEN PILLOW, MD 12/30/2023 1:24 PM  For on call review [email or web address].

## 2023-12-30 NOTE — Progress Notes (Signed)
 Mobility Specialist Progress Note:   12/30/23 0925  Mobility  Activity Ambulated with assistance  Level of Assistance Standby assist, set-up cues, supervision of patient - no hands on  Assistive Device Front wheel walker  Distance Ambulated (ft) 300 ft  Activity Response Tolerated well  Mobility Referral Yes  Mobility visit 1 Mobility  Mobility Specialist Start Time (ACUTE ONLY) [id number]  Mobility Specialist Stop Time (ACUTE ONLY) 0936  Mobility Specialist Time Calculation (min) (ACUTE ONLY) 11 min   Pt received in chair, agreeable to mobility session. Ambulated in hallway with RW and SV. Tolerated well, Max HR 87 bpm. Returned pt to room, sitting up in chair with call bell in reach.  Reesa Gotschall Mobility Specialist Please contact via Special educational needs teacher or  Rehab office at [phone]

## 2023-12-31 LAB — HEMOGLOBIN A1C
Hgb A1c MFr Bld: 5.9 % — ABNORMAL HIGH (ref 4.8–5.6)
Mean Plasma Glucose: 123 mg/dL

## 2024-01-16 ENCOUNTER — Inpatient Hospital Stay (HOSPITAL_COMMUNITY)
Admission: EM | Admit: 2024-01-16 | Discharge: 2024-01-20 | DRG: 871 | Disposition: A | Discharge status: Home / Self Care | Attending: Internal Medicine | Admitting: Internal Medicine

## 2024-01-16 ENCOUNTER — Other Ambulatory Visit: Payer: Self-pay

## 2024-01-16 DIAGNOSIS — G9341 Metabolic encephalopathy: Secondary | ICD-10-CM | POA: Diagnosis present

## 2024-01-16 DIAGNOSIS — Z87891 Personal history of nicotine dependence: Secondary | ICD-10-CM

## 2024-01-16 DIAGNOSIS — Z7902 Long term (current) use of antithrombotics/antiplatelets: Secondary | ICD-10-CM

## 2024-01-16 DIAGNOSIS — F03918 Unspecified dementia, unspecified severity, with other behavioral disturbance: Secondary | ICD-10-CM | POA: Diagnosis present

## 2024-01-16 DIAGNOSIS — U071 COVID-19: Secondary | ICD-10-CM

## 2024-01-16 DIAGNOSIS — E785 Hyperlipidemia, unspecified: Secondary | ICD-10-CM | POA: Diagnosis present

## 2024-01-16 DIAGNOSIS — K219 Gastro-esophageal reflux disease without esophagitis: Secondary | ICD-10-CM | POA: Diagnosis present

## 2024-01-16 DIAGNOSIS — Z91013 Allergy to seafood: Secondary | ICD-10-CM

## 2024-01-16 DIAGNOSIS — A419 Sepsis, unspecified organism: Principal | ICD-10-CM | POA: Diagnosis present

## 2024-01-16 DIAGNOSIS — R6510 Systemic inflammatory response syndrome (SIRS) of non-infectious origin without acute organ dysfunction: Principal | ICD-10-CM

## 2024-01-16 DIAGNOSIS — Z20822 Contact with and (suspected) exposure to covid-19: Secondary | ICD-10-CM | POA: Diagnosis present

## 2024-01-16 DIAGNOSIS — I10 Essential (primary) hypertension: Secondary | ICD-10-CM | POA: Diagnosis present

## 2024-01-16 DIAGNOSIS — Y95 Nosocomial condition: Secondary | ICD-10-CM | POA: Diagnosis present

## 2024-01-16 DIAGNOSIS — N400 Enlarged prostate without lower urinary tract symptoms: Secondary | ICD-10-CM | POA: Diagnosis present

## 2024-01-16 DIAGNOSIS — Z8673 Personal history of transient ischemic attack (TIA), and cerebral infarction without residual deficits: Secondary | ICD-10-CM

## 2024-01-16 DIAGNOSIS — J189 Pneumonia, unspecified organism: Secondary | ICD-10-CM | POA: Diagnosis present

## 2024-01-16 DIAGNOSIS — Z886 Allergy status to analgesic agent status: Secondary | ICD-10-CM

## 2024-01-16 NOTE — ED Triage Notes (Signed)
 Patient brought in by [facility] from [facility] care facility for temp 101.3. Facility had a concern for his temp because Covid is floating around the facility. Hx dementia and dysphagia due to past stroke. Alert and oriented to baseline per medic. BP 178/84 HR 85 02 95% 2L [place] BS 105.

## 2024-01-17 ENCOUNTER — Emergency Department (HOSPITAL_COMMUNITY)

## 2024-01-17 DIAGNOSIS — J189 Pneumonia, unspecified organism: Secondary | ICD-10-CM | POA: Diagnosis present

## 2024-01-17 DIAGNOSIS — I10 Essential (primary) hypertension: Secondary | ICD-10-CM | POA: Diagnosis present

## 2024-01-17 DIAGNOSIS — Z7902 Long term (current) use of antithrombotics/antiplatelets: Secondary | ICD-10-CM | POA: Diagnosis not present

## 2024-01-17 DIAGNOSIS — U071 COVID-19: Secondary | ICD-10-CM

## 2024-01-17 DIAGNOSIS — R6510 Systemic inflammatory response syndrome (SIRS) of non-infectious origin without acute organ dysfunction: Secondary | ICD-10-CM | POA: Diagnosis present

## 2024-01-17 DIAGNOSIS — A419 Sepsis, unspecified organism: Secondary | ICD-10-CM | POA: Diagnosis present

## 2024-01-17 DIAGNOSIS — E785 Hyperlipidemia, unspecified: Secondary | ICD-10-CM | POA: Diagnosis present

## 2024-01-17 DIAGNOSIS — N400 Enlarged prostate without lower urinary tract symptoms: Secondary | ICD-10-CM

## 2024-01-17 DIAGNOSIS — F03918 Unspecified dementia, unspecified severity, with other behavioral disturbance: Secondary | ICD-10-CM | POA: Diagnosis present

## 2024-01-17 DIAGNOSIS — G9341 Metabolic encephalopathy: Secondary | ICD-10-CM

## 2024-01-17 DIAGNOSIS — Z8673 Personal history of transient ischemic attack (TIA), and cerebral infarction without residual deficits: Secondary | ICD-10-CM | POA: Diagnosis not present

## 2024-01-17 DIAGNOSIS — Z20822 Contact with and (suspected) exposure to covid-19: Secondary | ICD-10-CM | POA: Diagnosis present

## 2024-01-17 DIAGNOSIS — Z91013 Allergy to seafood: Secondary | ICD-10-CM | POA: Diagnosis not present

## 2024-01-17 DIAGNOSIS — Z87891 Personal history of nicotine dependence: Secondary | ICD-10-CM | POA: Diagnosis not present

## 2024-01-17 DIAGNOSIS — Z886 Allergy status to analgesic agent status: Secondary | ICD-10-CM | POA: Diagnosis not present

## 2024-01-17 DIAGNOSIS — K219 Gastro-esophageal reflux disease without esophagitis: Secondary | ICD-10-CM | POA: Diagnosis present

## 2024-01-17 DIAGNOSIS — Y95 Nosocomial condition: Secondary | ICD-10-CM | POA: Diagnosis present

## 2024-01-17 LAB — RESPIRATORY PANEL BY PCR

## 2024-01-17 LAB — COMPREHENSIVE METABOLIC PANEL WITH GFR
ALT: 13 U/L (ref 0–44)
AST: 20 U/L (ref 15–41)
Albumin: 3.5 g/dL (ref 3.5–5.0)
Alkaline Phosphatase: 37 U/L — ABNORMAL LOW (ref 38–126)
Anion gap: 10 (ref 5–15)
BUN: 20 mg/dL (ref 8–23)
CO2: 24 mmol/L (ref 22–32)
Calcium: 9.5 mg/dL (ref 8.9–10.3)
Chloride: 106 mmol/L (ref 98–111)
Creatinine, Ser: 1.04 mg/dL (ref 0.61–1.24)
GFR, Estimated: >60 mL/min (ref >60)
Glucose, Bld: 103 mg/dL — ABNORMAL HIGH (ref 70–99)
Potassium: 4 mmol/L (ref 3.5–5.1)
Sodium: 139 mmol/L (ref 135–145)
Total Bilirubin: 0.3 mg/dL (ref 0.0–1.2)
Total Protein: 6.5 g/dL (ref 6.5–8.1)

## 2024-01-17 LAB — URINALYSIS, W/ REFLEX TO CULTURE (INFECTION SUSPECTED)
Bacteria, UA: NONE SEEN
Bilirubin Urine: NEGATIVE
Glucose, UA: NEGATIVE mg/dL
Hgb urine dipstick: NEGATIVE
Ketones, ur: NEGATIVE mg/dL
Leukocytes,Ua: NEGATIVE
Nitrite: NEGATIVE
Protein, ur: NEGATIVE mg/dL
Specific Gravity, Urine: 1.016 (ref 1.005–1.030)
pH: 6 (ref 5.0–8.0)

## 2024-01-17 LAB — CREATININE, SERUM
Creatinine, Ser: 1.04 mg/dL (ref 0.61–1.24)
GFR, Estimated: >60 mL/min (ref >60)

## 2024-01-17 LAB — CBC WITH DIFFERENTIAL/PLATELET
Abs Immature Granulocytes: 0.06 K/uL (ref 0.00–0.07)
Basophils Absolute: 0 K/uL (ref 0.0–0.1)
Basophils Relative: 0 %
Eosinophils Absolute: 0.2 K/uL (ref 0.0–0.5)
Eosinophils Relative: 2 %
HCT: 36.7 % — ABNORMAL LOW (ref 39.0–52.0)
Hemoglobin: 11.4 g/dL — ABNORMAL LOW (ref 13.0–17.0)
Immature Granulocytes: 1 %
Lymphocytes Relative: 14 %
Lymphs Abs: 1.6 K/uL (ref 0.7–4.0)
MCH: 27.2 pg (ref 26.0–34.0)
MCHC: 31.1 g/dL (ref 30.0–36.0)
MCV: 87.6 fL (ref 80.0–100.0)
Monocytes Absolute: 1.3 K/uL — ABNORMAL HIGH (ref 0.1–1.0)
Monocytes Relative: 11 %
Neutro Abs: 8.4 K/uL — ABNORMAL HIGH (ref 1.7–7.7)
Neutrophils Relative %: 72 %
Platelets: 238 K/uL (ref 150–400)
RBC: 4.19 MIL/uL — ABNORMAL LOW (ref 4.22–5.81)
RDW: 15.5 % (ref 11.5–15.5)
WBC: 11.6 K/uL — ABNORMAL HIGH (ref 4.0–10.5)
nRBC: 0 % (ref 0.0–0.2)

## 2024-01-17 LAB — STREP PNEUMONIAE URINARY ANTIGEN: Strep Pneumo Urinary Antigen: NEGATIVE

## 2024-01-17 LAB — I-STAT CG4 LACTIC ACID, ED
Lactic Acid, Venous: 0.4 mmol/L — ABNORMAL LOW (ref 0.5–1.9)
Lactic Acid, Venous: 0.4 mmol/L — ABNORMAL LOW (ref 0.5–1.9)

## 2024-01-17 LAB — RESP PANEL BY RT-PCR (RSV, FLU A&B, COVID)  RVPGX2
Influenza A by PCR: NEGATIVE
Influenza A by PCR: NEGATIVE
Influenza B by PCR: NEGATIVE
Influenza B by PCR: NEGATIVE
Resp Syncytial Virus by PCR: NEGATIVE
Resp Syncytial Virus by PCR: NEGATIVE
SARS Coronavirus 2 by RT PCR: NEGATIVE
SARS Coronavirus 2 by RT PCR: NEGATIVE

## 2024-01-17 LAB — CBC
HCT: 36.5 % — ABNORMAL LOW (ref 39.0–52.0)
Hemoglobin: 10.8 g/dL — ABNORMAL LOW (ref 13.0–17.0)
MCH: 27.1 pg (ref 26.0–34.0)
MCHC: 29.6 g/dL — ABNORMAL LOW (ref 30.0–36.0)
MCV: 91.5 fL (ref 80.0–100.0)
Platelets: 223 K/uL (ref 150–400)
RBC: 3.99 MIL/uL — ABNORMAL LOW (ref 4.22–5.81)
RDW: 15.6 % — ABNORMAL HIGH (ref 11.5–15.5)
WBC: 10.4 K/uL (ref 4.0–10.5)
nRBC: 0 % (ref 0.0–0.2)

## 2024-01-17 LAB — PROTIME-INR
INR: 1.1 (ref 0.8–1.2)
Prothrombin Time: 14.7 s (ref 11.4–15.2)

## 2024-01-17 LAB — PROCALCITONIN: Procalcitonin: <0.1 ng/mL

## 2024-01-17 LAB — MRSA NEXT GEN BY PCR, NASAL: MRSA by PCR Next Gen: NOT DETECTED

## 2024-01-17 MED ORDER — ASPIRIN 81 MG PO TBEC
81.0000 mg | DELAYED_RELEASE_TABLET | Freq: Every day | ORAL | Status: DC
  Administered 2024-01-18 – 2024-01-20 (×3): 81 mg via ORAL
  Filled 2024-01-17 (×4): qty 1

## 2024-01-17 MED ORDER — ENOXAPARIN SODIUM 40 MG/0.4ML IJ SOSY
40.0000 mg | PREFILLED_SYRINGE | Freq: Every day | INTRAMUSCULAR | Status: DC
  Administered 2024-01-18 – 2024-01-20 (×3): 40 mg via SUBCUTANEOUS
  Filled 2024-01-17 (×4): qty 0.4

## 2024-01-17 MED ORDER — METRONIDAZOLE 500 MG/100ML IV SOLN
500.0000 mg | Freq: Once | INTRAVENOUS | Status: AC
  Administered 2024-01-17: 500 mg via INTRAVENOUS
  Filled 2024-01-17: qty 100

## 2024-01-17 MED ORDER — CLOPIDOGREL BISULFATE 75 MG PO TABS
75.0000 mg | ORAL_TABLET | Freq: Every day | ORAL | Status: DC
Start: 2024-01-17 — End: 2024-01-20
  Administered 2024-01-18 – 2024-01-20 (×3): 75 mg via ORAL
  Filled 2024-01-17 (×4): qty 1

## 2024-01-17 MED ORDER — POLYETHYLENE GLYCOL 3350 17 G PO PACK: 17.0000 g | PACK | Freq: Every day | ORAL | Status: DC | PRN

## 2024-01-17 MED ORDER — LACTATED RINGERS IV BOLUS
500.0000 mL | Freq: Once | INTRAVENOUS | Status: AC
  Administered 2024-01-17: 500 mL via INTRAVENOUS

## 2024-01-17 MED ORDER — VANCOMYCIN HCL IN DEXTROSE 1-5 GM/200ML-% IV SOLN
1000.0000 mg | INTRAVENOUS | Status: DC
  Administered 2024-01-17: 1000 mg via INTRAVENOUS
  Filled 2024-01-17: qty 200

## 2024-01-17 MED ORDER — SODIUM CHLORIDE 0.9 % IV SOLN
2.0000 g | Freq: Two times a day (BID) | INTRAVENOUS | Status: DC
  Administered 2024-01-17 – 2024-01-19 (×6): 2 g via INTRAVENOUS
  Filled 2024-01-17 (×6): qty 12.5

## 2024-01-17 MED ORDER — QUETIAPINE FUMARATE 50 MG PO TABS
50.0000 mg | ORAL_TABLET | Freq: Every day | ORAL | Status: DC
  Administered 2024-01-17 – 2024-01-19 (×3): 50 mg via ORAL
  Filled 2024-01-17 (×3): qty 1

## 2024-01-17 MED ORDER — FINASTERIDE 5 MG PO TABS
5.0000 mg | ORAL_TABLET | Freq: Every day | ORAL | Status: DC
Start: 2024-01-17 — End: 2024-01-20
  Administered 2024-01-18 – 2024-01-20 (×3): 5 mg via ORAL
  Filled 2024-01-17 (×4): qty 1

## 2024-01-17 MED ORDER — LACTATED RINGERS IV SOLN
INTRAVENOUS | Status: AC
  Administered 2024-01-17: 150 mL/h via INTRAVENOUS

## 2024-01-17 MED ORDER — VANCOMYCIN HCL IN DEXTROSE 1-5 GM/200ML-% IV SOLN
1000.0000 mg | Freq: Once | INTRAVENOUS | Status: AC
  Administered 2024-01-17: 1000 mg via INTRAVENOUS
  Filled 2024-01-17: qty 200

## 2024-01-17 MED ORDER — FLUOXETINE HCL 20 MG PO CAPS
20.0000 mg | ORAL_CAPSULE | Freq: Every day | ORAL | Status: DC
  Administered 2024-01-18 – 2024-01-20 (×3): 20 mg via ORAL
  Filled 2024-01-17 (×4): qty 1

## 2024-01-17 MED ORDER — DOCUSATE SODIUM 100 MG PO CAPS
100.0000 mg | ORAL_CAPSULE | Freq: Two times a day (BID) | ORAL | Status: DC
  Administered 2024-01-17 – 2024-01-20 (×6): 100 mg via ORAL
  Filled 2024-01-17 (×6): qty 1

## 2024-01-17 MED ORDER — PANTOPRAZOLE SODIUM 40 MG PO TBEC
40.0000 mg | DELAYED_RELEASE_TABLET | Freq: Every day | ORAL | Status: DC
  Administered 2024-01-17 – 2024-01-20 (×4): 40 mg via ORAL
  Filled 2024-01-17 (×4): qty 1

## 2024-01-17 MED ORDER — FOLIC ACID 1 MG PO TABS
1.0000 mg | ORAL_TABLET | Freq: Every day | ORAL | Status: DC
Start: 2024-01-17 — End: 2024-01-20
  Administered 2024-01-18 – 2024-01-20 (×3): 1 mg via ORAL
  Filled 2024-01-17 (×4): qty 1

## 2024-01-17 MED ORDER — CARVEDILOL 3.125 MG PO TABS
3.1250 mg | ORAL_TABLET | Freq: Two times a day (BID) | ORAL | Status: DC
  Administered 2024-01-18: 3.125 mg via ORAL
  Filled 2024-01-17 (×2): qty 1

## 2024-01-17 MED ORDER — METRONIDAZOLE 500 MG/100ML IV SOLN
500.0000 mg | Freq: Two times a day (BID) | INTRAVENOUS | Status: DC
  Administered 2024-01-17 – 2024-01-19 (×5): 500 mg via INTRAVENOUS
  Filled 2024-01-17 (×5): qty 100

## 2024-01-17 MED ORDER — TAMSULOSIN HCL 0.4 MG PO CAPS
0.4000 mg | ORAL_CAPSULE | Freq: Every day | ORAL | Status: DC
  Administered 2024-01-17 – 2024-01-20 (×4): 0.4 mg via ORAL
  Filled 2024-01-17 (×4): qty 1

## 2024-01-17 MED ORDER — ATORVASTATIN CALCIUM 40 MG PO TABS
40.0000 mg | ORAL_TABLET | Freq: Every day | ORAL | Status: DC
Start: 2024-01-17 — End: 2024-01-20
  Administered 2024-01-18 – 2024-01-20 (×3): 40 mg via ORAL
  Filled 2024-01-17 (×4): qty 1

## 2024-01-17 MED ORDER — SENNOSIDES-DOCUSATE SODIUM 8.6-50 MG PO TABS
1.0000 | ORAL_TABLET | Freq: Every day | ORAL | Status: DC
  Administered 2024-01-17 – 2024-01-19 (×3): 1 via ORAL
  Filled 2024-01-17 (×3): qty 1

## 2024-01-17 MED ORDER — SODIUM CHLORIDE 0.9 % IV SOLN
2.0000 g | Freq: Once | INTRAVENOUS | Status: AC
  Administered 2024-01-17: 2 g via INTRAVENOUS
  Filled 2024-01-17: qty 12.5

## 2024-01-17 NOTE — Progress Notes (Signed)
 Pharmacy Antibiotic Note  Dennis [name] is a 78 y.o. male admitted on 01/16/2024 with sepsis.  Pharmacy has been consulted for cefepime  & vancomycin  dosing.  Plan: Vancomycin  1 gm given in ED @ 0138 am, then vancomycin  1 gm IV q24 for eAUC 509.8 using TBW (TBW<IBW), SCr 1.04, Vd 0.72 Cefepime  2 gm IV q12hrs  Check MRSA PCR    Temp (24hrs), Avg:99.8 F (37.7 C), Min:98 F (36.7 C), Max:101.6 F (38.7 C)  Recent Labs  Lab 01/17/24 0035 01/17/24 0052  WBC  --  11.6*  CREATININE  --  1.04  LATICACIDVEN 0.4*  --     CrCl cannot be calculated (Unknown ideal weight.).    Allergies  Allergen Reactions   Codeine    Shellfish Allergy     Antimicrobials this admission: 8/30 vanc>> 8/30 cefepime >> 8/30 Flagyl  x 1  Dose adjustments this admission:  Microbiology results: 8/30 BCx:  8/30 MRSA:  Thank you for allowing pharmacy to be a part of this patient's care.  Rosaline IVAR Edison, Pharm.D Use secure chat for questions 01/17/2024 7:51 AM

## 2024-01-17 NOTE — Evaluation (Signed)
 Clinical/Bedside Swallow Evaluation Patient Details  Name: Dennis [name] MRN: [id number] Date of Birth: 25-May-1945  Today's Date: 01/17/2024 Time: SLP Start Time (ACUTE ONLY): 1625 SLP Stop Time (ACUTE ONLY): 1710 SLP Time Calculation (min) (ACUTE ONLY): 45 min  Past Medical History:  Past Medical History:  Diagnosis Date   Arthritis    Dementia (HCC)    Hypertension    Past Surgical History:  Past Surgical History:  Procedure Laterality Date   CHOLECYSTECTOMY     THROAT SURGERY     HPI:  pt is a 78 yo H/O of CVA, dementia, HTN, and hyperlipidemia, recent admission 12/28/2023-12/30/2023 (for TIA) and acute encephalopathy presented from [facility] memory care facility due to fevers.  Patient has dementia and unable to provide reliable history.  Per EMS and ED records, COVID was going around in the facility and patient was noted to have fevers 101.3 F, cough and rhinorrhea.   Pt was put on airbourne precautions - all respiratory panels negative - COVID negative.  MRI (06/21/23) revealed, Punctate acute infarct in the left posterior subinsular white matter... small area of acute infarct in the more superior left frontal cortex. There also 2 punctate areas of diffusion hyperintensity without ADC correlate in the superior right frontal lobe which may be areas of subacute ischemia  Pt also with H/O COPD, Barrett's esophagus, RA and OSA.   Swallow eval ordered.  Pt is verbose - and mildly dysarthric-aphasic - but has dementia - thus difficult to dis certain source.  Pt has undergone multiple prior esophageal dilatations, ablation and biopsies for Barrett's and H/O throat cancer per extensive chart review. Pt has some HEARING LOSS.     Assessment / Plan / Recommendation  Clinical Impression  Patient presents with functional oropharyngeal swallow ability based on clinical swallow evaluation.  He is verbose, has mild dysarthria *which is baseline per extensive chart review* and demonstrates  expressive language deficits *again baseline*.  He benefits from verbal cues to CEASE talking and to eat SLOWLY.  Slight facial asymmetry noted otherwise CN exam is [facility]. Note H/O throat CA and throat surgery per prior office visit note- but cannot locate specific information about it.       Adequate timing of swallow and laryngeal elevation observed clinically. No S/S of aspiration noted across all po including an entire sandwich, applesauce, spaghetti, roll, broccoli, juice, water  and soda.  He eats very rapidly and still maintained airway protection.     Does have H/O Barrett's esophagus -and has required multiple dilatations, thus recommend general esophageal precautions. No SlP follow up indicated.   SLP Visit Diagnosis: Dysphagia, unspecified (R13.10)    Aspiration Risk  Mild aspiration risk    Diet Recommendation Regular;Thin liquid    Liquid Administration via: Cup;Straw Medication Administration: Whole meds with liquid (one at a time) Supervision: Patient able to self feed Compensations: Slow rate;Small sips/bites Postural Changes: Seated upright at 90 degrees;Remain upright for at least 30 minutes after po intake    Other  Recommendations Oral Care Recommendations: Oral care BID     Assistance Recommended at Discharge  N/a  Functional Status Assessment Patient has not had a recent decline in their functional status  Frequency and Duration   N/a         Prognosis   N/a     Swallow Study   General Date of Onset: 01/17/24 HPI: pt is a 78 yo H/O of CVA, dementia, HTN, and hyperlipidemia, recent admission 12/28/2023-12/30/2023 (for TIA) and acute encephalopathy  presented from [facility] memory care facility due to fevers.  Patient has dementia and unable to provide reliable history.  Per EMS and ED records, COVID was going around in the facility and patient was noted to have fevers 101.3 F, cough and rhinorrhea.   Pt was put on airbourne precautions - all respiratory  panels negative - COVID negative.  MRI (06/21/23) revealed, Punctate acute infarct in the left posterior subinsular white matter... small area of acute infarct in the more superior left frontal cortex. There also 2 punctate areas of diffusion hyperintensity without ADC correlate in the superior right frontal lobe which may be areas of subacute ischemia  Pt also with H/O COPD, Barrett's esophagus, RA and OSA.   Swallow eval ordered.  Pt is verbose - and mildly dysarthric-aphasic - but has dementia - thus difficult to discertain source. Type of Study: Bedside Swallow Evaluation Previous Swallow Assessment: prior MBS x2 - last 06/2023 -- Dys2/chopped/thin Diet Prior to this Study: Dysphagia 3 (mechanical soft) Temperature Spikes Noted: No Respiratory Status: Room air History of Recent Intubation: No Behavior/Cognition: Alert;Cooperative;Pleasant mood Oral Cavity Assessment: Within Functional Limits Oral Care Completed by SLP: Yes Oral Cavity - Dentition: Other (Comment) (some dentition missing) Vision: Functional for self-feeding Self-Feeding Abilities: Able to feed self Patient Positioning: Upright in bed Baseline Vocal Quality: Normal Volitional Cough: Strong Volitional Swallow: Able to elicit    Oral/Motor/Sensory Function Overall Oral Motor/Sensory Function: Mild impairment Facial ROM: Within Functional Limits (slight facial asymmetry) Facial Symmetry: Abnormal symmetry right Facial Strength: Within Functional Limits Lingual ROM: Within Functional Limits Lingual Symmetry: Within Functional Limits Lingual Strength: Reduced;Other (Comment) (slight dysarthria) Velum: Within Functional Limits Mandible: Within Functional Limits   Ice Chips Ice chips: Not tested   Thin Liquid Thin Liquid: Within functional limits Presentation: Cup;Self Fed;Straw;Spoon    Nectar Thick Nectar Thick Liquid: Not tested   Honey Thick Honey Thick Liquid: Not tested   Puree Puree: Within functional  limits Presentation: Self Fed;Spoon   Solid     Solid: Within functional limits Presentation: Self Fed      Nicolas Emmie Caldron 01/17/2024,5:19 PM  Madelin POUR, MS [facility] SLP Acute Rehab Services Office [phone]

## 2024-01-17 NOTE — H&P (Addendum)
 History and Physical  Patient: Dennis [name] FMW:[id number] DOB: Jul 30, 1945 DOA: 01/16/2024 DOS: the patient was seen and examined on 01/17/2024 Patient coming from: SNF  Chief Complaint:  Chief Complaint  Patient presents with   Fever   HPI: Dennis [name] is a 78 y.o. male with PMH significant of CVA, dementia, HTN, and hyperlipidemia, recent admission 12/28/2023-12/30/2023 (for TIA) and acute encephalopathy presented from [facility] memory care facility due to fevers.  Patient has dementia and unable to provide reliable history.  Per EMS and ED records, COVID was going around in the facility and patient was noted to have fevers 101.3 F, cough and rhinorrhea.  No ongoing nausea vomiting, chest pain, acute shortness of breath or any diarrhea.  ED course:  In ED, temp noted to be 101.6 F, RR 21, BP 148/72, O2 sats 97% on 2 L CMET unremarkable, WBCs elevated 11.6, hemoglobin 11.4, platelets 238K, lactic acid 0.4 UA negative for UTI Flu, RSV, COVID-negative, repeating again to rule out false neg Chest x-ray showed no active disease, stable nodular density at the right apex  Review of Systems: unable to review all systems due to the inability of the patient to answer questions. Past Medical History:  Diagnosis Date   Arthritis    Dementia (HCC)    Hypertension    Past Surgical History:  Procedure Laterality Date   CHOLECYSTECTOMY     THROAT SURGERY     Social History:  reports that he has quit smoking. His smoking use included cigarettes. He has never used smokeless tobacco. He reports that he does not drink alcohol  and does not use drugs. Allergies  Allergen Reactions   Codeine    Shellfish Allergy    No family history on file. Prior to Admission medications   Medication Sig Start Date End Date Taking? Authorizing Provider  aspirin  EC 81 MG tablet Take one tablet daily till 01/20/2024 and then discontinue. 12/30/23  Yes Mdala-Gausi, Masiku Agatha, MD  atorvastatin  (LIPITOR )  40 MG tablet Take 1 tablet (40 mg total) by mouth daily. Patient taking differently: Take 40 mg by mouth every evening. 03/16/23  Yes Ghimire, Donalda HERO, MD  carvedilol  (COREG ) 3.125 MG tablet Take 1 tablet (3.125 mg total) by mouth 2 (two) times daily with a meal. 03/16/23  Yes Ghimire, Donalda HERO, MD  clopidogrel  (PLAVIX ) 75 MG tablet Take 1 tablet (75 mg total) by mouth daily. 12/31/23  Yes Mdala-Gausi, Masiku Agatha, MD  docusate sodium  (COLACE) 100 MG capsule Take 1 capsule (100 mg total) by mouth 2 (two) times daily. 08/05/23  Yes Darci Pore, MD  finasteride  (PROSCAR ) 5 MG tablet Take 1 tablet (5 mg total) by mouth daily. Patient taking differently: Take 5 mg by mouth in the morning. 03/16/23  Yes Ghimire, Donalda HERO, MD  FLUoxetine  (PROZAC ) 20 MG capsule Take 20 mg by mouth in the morning.   Yes [provider]  folic acid  (FOLVITE ) 1 MG tablet Take 1 tablet (1 mg total) by mouth daily. Patient taking differently: Take 1 mg by mouth in the morning. 03/16/23  Yes Ghimire, Donalda HERO, MD  pantoprazole  (PROTONIX ) 40 MG tablet Take 1 tablet (40 mg total) by mouth daily. Patient taking differently: Take 40 mg by mouth in the morning. 08/06/23  Yes Darci Pore, MD  polyethylene glycol (MIRALAX  / GLYCOLAX ) 17 g packet Take 17 g by mouth daily as needed. 08/05/23  Yes Darci Pore, MD  QUEtiapine  (SEROQUEL ) 50 MG tablet Take 1 tablet (50 mg total) by  mouth at bedtime. 12/30/23  Yes Mdala-Gausi, Masiku Agatha, MD  senna-docusate (SENOKOT-S) 8.6-50 MG tablet Take 1 tablet by mouth at bedtime as needed for mild constipation. 08/05/23  Yes Darci Pore, MD  tamsulosin  (FLOMAX ) 0.4 MG CAPS capsule Take 1 capsule (0.4 mg total) by mouth daily. Patient taking differently: Take 0.4 mg by mouth in the morning. 03/16/23  Yes GhimireDonalda HERO, MD   Physical Exam: Vitals:   01/17/24 0400 01/17/24 0459 01/17/24 0500 01/17/24 0630  BP: 122/61  114/63 (!) 111/54  Pulse:  69  69 66  Resp: 16  19 18   Temp:  99.8 F (37.7 C)    TempSrc:  Rectal    SpO2: 99%  97% 98%     General: Alert, awake, oriented x self, NAD, coughing during encounter Eyes: pink conjunctiva, anicteric sclera, PERLA HEENT: normocephalic, atraumatic, oropharynx clear Neck: supple, no masses or lymphadenopathy, no JVD CVS: Regular rate and rhythm, no murmurs, rubs or gallops. Resp : Diminished breath sound at the bases GI : Soft, nontender, nondistended, positive bowel sounds. No hepatomegaly.  Ext: No lower extremity edema  Musculoskeletal: No clubbing or cyanosis, positive pedal pulses. No contracture. ROM intact  Neuro: Grossly intact, no focal neurological deficits, strength 5/5 upper and lower extremities bilaterally Psych: Dementia , oriented to himself Skin: no rashes or lesions, warm and dry   Data Reviewed: I have reviewed ED notes, Vitals, Lab results and outpatient records.   Recent Labs  Lab 01/17/24 0052  NA 139  K 4.0  CL 106  CO2 24  GLUCOSE 103*  BUN 20  CREATININE 1.04  CALCIUM  9.5   Recent Labs  Lab 01/17/24 0052  WBC 11.6*  NEUTROABS 8.4*  HGB 11.4*  HCT 36.7*  MCV 87.6  PLT 238    Assessment and Plan Principal Problem:   Sepsis (HCC) likely has acute viral respiratory infection/HCAP - Presented with fever, mild leukocytosis, hypotension, BP 100/53, no lactic acidosis - Recheck COVID, RSV or flu, will repeat test to rule out false negative, check respiratory virus panel - Currently on 2 L O2 via [place], O2 sats 97 to 98% - Follow blood cultures, procalcitonin, sputum culture, Legionella antigen, urine strep antigen - For now placed on IV vancomycin , cefepime , IV Flagyl  -SLP evaluation-for now placed on dysphagia 3 diet   Active Problems: Acute metabolic encephalopathy, superimposed on dementia - Unclear baseline, patient is alert and oriented to self, pleasant, follows commands however unable to provide reliable history.  UA negative for  UTI.  Likely due to #1. - Will continue Seroquel , paroxetine - Delirium precautions    HLD (hyperlipidemia) -Continue statin    HTN (hypertension) -BP currently stable, continue Coreg   History of stroke -Patient had a recent admission, was recommended aspirin  and Plavix  for 3 weeks, aspirin  till 01/20/2024 and then continue Plavix  -Continue statin, no acute neurological deficits    BPH (benign prostatic hyperplasia) -Continue finasteride , Flomax   GERD -Continue PPI    Advance Care Planning:   Code Status: Full Code  (per SNF records) Consults: None Family Communication: No family at the bedside Severity of Illness:      The appropriate patient status for this patient is INPATIENT. Inpatient status is judged to be reasonable and necessary in order to provide the required intensity of service to ensure the patient's safety. The patient's presenting symptoms, physical exam findings, and initial radiographic and laboratory data in the context of their chronic comorbidities is felt to place them at high risk for  further clinical deterioration. Furthermore, it is not anticipated that the patient will be medically stable for discharge from the hospital within 2 midnights of admission.   * I certify that at the point of admission it is my clinical judgment that the patient will require inpatient hospital care spanning beyond 2 midnights from the point of admission due to high intensity of service, high risk for further deterioration and high frequency of surveillance required.*    Author: Nydia Distance, MD 01/17/2024 7:40 AM For on call review [email or web address].

## 2024-01-17 NOTE — ED Notes (Signed)
 Spoke with erika from facility to follow up about patient.

## 2024-01-17 NOTE — Progress Notes (Signed)
 Patient hx of dementia.  Unable to answer admission screening questions.  Pleasantly confused/oriented to self only at this time.

## 2024-01-17 NOTE — ED Provider Notes (Signed)
 [place] EMERGENCY DEPARTMENT AT [facility] Provider Note   CSN: [id number] Arrival date & time: 01/16/24  2321     Patient presents with: Fever   Dennis [name] is a 78 y.o. male.   The history is provided by the patient.  Fever Max temp prior to arrival:  101.3 Temp source:  Oral Severity:  Moderate Onset quality:  Sudden Timing:  Constant Progression:  Unchanged Chronicity:  New Relieved by:  Nothing Worsened by:  Nothing Ineffective treatments:  None tried Associated symptoms: cough and rhinorrhea   Associated symptoms comment:  Covid is going around nursing home       Prior to Admission medications   Medication Sig Start Date End Date Taking? Authorizing Provider  aspirin  EC 81 MG tablet Take one tablet daily till 01/20/2024 and then discontinue. 12/30/23   Mdala-Gausi, Masiku Agatha, MD  atorvastatin  (LIPITOR ) 40 MG tablet Take 1 tablet (40 mg total) by mouth daily. Patient taking differently: Take 40 mg by mouth every evening. 03/16/23   Ghimire, Donalda HERO, MD  carvedilol  (COREG ) 3.125 MG tablet Take 1 tablet (3.125 mg total) by mouth 2 (two) times daily with a meal. 03/16/23   Ghimire, Donalda HERO, MD  clopidogrel  (PLAVIX ) 75 MG tablet Take 1 tablet (75 mg total) by mouth daily. 12/31/23   Mdala-Gausi, Masiku Agatha, MD  docusate sodium  (COLACE) 100 MG capsule Take 1 capsule (100 mg total) by mouth 2 (two) times daily. 08/05/23   Darci Pore, MD  finasteride  (PROSCAR ) 5 MG tablet Take 1 tablet (5 mg total) by mouth daily. Patient taking differently: Take 5 mg by mouth in the morning. 03/16/23   Ghimire, Donalda HERO, MD  FLUoxetine  (PROZAC ) 20 MG capsule Take 20 mg by mouth in the morning.    [provider]  folic acid  (FOLVITE ) 1 MG tablet Take 1 tablet (1 mg total) by mouth daily. Patient taking differently: Take 1 mg by mouth in the morning. 03/16/23   Ghimire, Donalda HERO, MD  pantoprazole  (PROTONIX ) 40 MG tablet Take 1 tablet (40 mg  total) by mouth daily. Patient taking differently: Take 40 mg by mouth in the morning. 08/06/23   Darci Pore, MD  polyethylene glycol (MIRALAX  / GLYCOLAX ) 17 g packet Take 17 g by mouth daily as needed. 08/05/23   Darci Pore, MD  QUEtiapine  (SEROQUEL ) 50 MG tablet Take 1 tablet (50 mg total) by mouth at bedtime. 12/30/23   Mdala-Gausi, Masiku Agatha, MD  senna-docusate (SENOKOT-S) 8.6-50 MG tablet Take 1 tablet by mouth at bedtime as needed for mild constipation. 08/05/23   Darci Pore, MD  tamsulosin  (FLOMAX ) 0.4 MG CAPS capsule Take 1 capsule (0.4 mg total) by mouth daily. Patient taking differently: Take 0.4 mg by mouth in the morning. 03/16/23   Ghimire, Donalda HERO, MD    Allergies: Codeine and Shellfish allergy    Review of Systems  Constitutional:  Positive for fever.  HENT:  Positive for rhinorrhea.   Respiratory:  Positive for cough.     Updated Vital Signs BP 115/84   Pulse 73   Temp (!) 101.6 F (38.7 C) (Rectal)   Resp 20   SpO2 92%   Physical Exam  (all labs ordered are listed, but only abnormal results are displayed) Results for orders placed or performed during the hospital encounter of 01/16/24  I-Stat Lactic Acid, ED   Collection Time: 01/17/24 12:35 AM  Result Value Ref Range   Lactic Acid, Venous 0.4 (L) 0.5 - 1.9 mmol/L  Resp panel by RT-PCR (RSV, Flu A&B, Covid) Anterior Nasal Swab   Collection Time: 01/17/24 12:51 AM   Specimen: Anterior Nasal Swab  Result Value Ref Range   SARS Coronavirus 2 by RT PCR NEGATIVE NEGATIVE   Influenza A by PCR NEGATIVE NEGATIVE   Influenza B by PCR NEGATIVE NEGATIVE   Resp Syncytial Virus by PCR NEGATIVE NEGATIVE  Comprehensive metabolic panel   Collection Time: 01/17/24 12:52 AM  Result Value Ref Range   Sodium 139 135 - 145 mmol/L   Potassium 4.0 3.5 - 5.1 mmol/L   Chloride 106 98 - 111 mmol/L   CO2 24 22 - 32 mmol/L   Glucose, Bld 103 (H) 70 - 99 mg/dL   BUN 20 8 - 23 mg/dL    Creatinine, Ser 8.95 0.61 - 1.24 mg/dL   Calcium  9.5 8.9 - 10.3 mg/dL   Total Protein 6.5 6.5 - 8.1 g/dL   Albumin 3.5 3.5 - 5.0 g/dL   AST 20 15 - 41 U/L   ALT 13 0 - 44 U/L   Alkaline Phosphatase 37 (L) 38 - 126 U/L   Total Bilirubin 0.3 0.0 - 1.2 mg/dL   GFR, Estimated >39 >39 mL/min   Anion gap 10 5 - 15  CBC with Differential   Collection Time: 01/17/24 12:52 AM  Result Value Ref Range   WBC 11.6 (H) 4.0 - 10.5 K/uL   RBC 4.19 (L) 4.22 - 5.81 MIL/uL   Hemoglobin 11.4 (L) 13.0 - 17.0 g/dL   HCT 63.2 (L) 60.9 - 47.9 %   MCV 87.6 80.0 - 100.0 fL   MCH 27.2 26.0 - 34.0 pg   MCHC 31.1 30.0 - 36.0 g/dL   RDW 84.4 88.4 - 84.4 %   Platelets 238 150 - 400 K/uL   nRBC 0.0 0.0 - 0.2 %   Neutrophils Relative % 72 %   Neutro Abs 8.4 (H) 1.7 - 7.7 K/uL   Lymphocytes Relative 14 %   Lymphs Abs 1.6 0.7 - 4.0 K/uL   Monocytes Relative 11 %   Monocytes Absolute 1.3 (H) 0.1 - 1.0 K/uL   Eosinophils Relative 2 %   Eosinophils Absolute 0.2 0.0 - 0.5 K/uL   Basophils Relative 0 %   Basophils Absolute 0.0 0.0 - 0.1 K/uL   Immature Granulocytes 1 %   Abs Immature Granulocytes 0.06 0.00 - 0.07 K/uL  Protime-INR   Collection Time: 01/17/24 12:52 AM  Result Value Ref Range   Prothrombin Time 14.7 11.4 - 15.2 seconds   INR 1.1 0.8 - 1.2  Urinalysis, w/ Reflex to Culture (Infection Suspected) -Urine, Clean Catch   Collection Time: 01/17/24  2:59 AM  Result Value Ref Range   Specimen Source URINE, CLEAN CATCH    Color, Urine YELLOW YELLOW   APPearance CLEAR CLEAR   Specific Gravity, Urine 1.016 1.005 - 1.030   pH 6.0 5.0 - 8.0   Glucose, UA NEGATIVE NEGATIVE mg/dL   Hgb urine dipstick NEGATIVE NEGATIVE   Bilirubin Urine NEGATIVE NEGATIVE   Ketones, ur NEGATIVE NEGATIVE mg/dL   Protein, ur NEGATIVE NEGATIVE mg/dL   Nitrite NEGATIVE NEGATIVE   Leukocytes,Ua NEGATIVE NEGATIVE   RBC / HPF 0-5 0 - 5 RBC/hpf   WBC, UA 0-5 0 - 5 WBC/hpf   Bacteria, UA NONE SEEN NONE SEEN   Squamous  Epithelial / HPF 0-5 0 - 5 /HPF   Mucus PRESENT    DG Chest Port 1 View Result Date: 01/17/2024 CLINICAL DATA:  Sepsis,  fever EXAM: PORTABLE CHEST 1 VIEW COMPARISON:  12/28/2023, 09/16/2023 FINDINGS: The lungs are symmetrically well expanded. Stable nodular density at the right apex. No confluent pulmonary infiltrate. No pneumothorax or pleural effusion. Cardiac size is at the upper limits of normal. Pulmonary vascularity is normal. Osseous structures are age-appropriate. No acute bone abnormality. IMPRESSION: 1. No active disease. 2. Stable nodular density at the right apex. This could be better assessed with dedicated nonemergent CT imaging. Electronically Signed   By: Dorethia Molt M.D.   On: 01/17/2024 00:46   VAS US  CAROTID (at [facility] and [facility] only) Result Date: 12/31/2023 Carotid Arterial Duplex Study Patient Name:  Wah LITTIE Nicosia  Date of Exam:   12/29/2023 Medical Rec #: [id number]      Accession #:    [id number] Date of Birth: Sep 02, 1945      Patient Gender: M Patient Age:   41 years Exam Location:  [facility] Procedure:      VAS US  CAROTID Referring Phys: EARLE DE LA TORRE --------------------------------------------------------------------------------  Indications:       Speech disturbance and Difficulty ambulating. Risk Factors:      Hypertension, hyperlipidemia, prior CVA. Other Factors:     Dementia. Limitations        Today's exam was limited due to Patient confusion, tortuosity                    of vessels. Comparison Study:  No prior study Performing Technologist: Alberta Lis RVS  Examination Guidelines: A complete evaluation includes B-mode imaging, spectral Doppler, color Doppler, and power Doppler as needed of all accessible portions of each vessel. Bilateral testing is considered an integral part of a complete examination. Limited examinations for reoccurring indications may be performed as noted.  Right Carotid Findings:  +----------+--------+--------+--------+------------------+---------+           PSV cm/sEDV cm/sStenosisPlaque DescriptionComments  +----------+--------+--------+--------+------------------+---------+ CCA Prox  48      3               heterogenous                +----------+--------+--------+--------+------------------+---------+ CCA Distal54      8               heterogenous                +----------+--------+--------+--------+------------------+---------+ ICA Prox  91      9       1-39%   calcific          Shadowing +----------+--------+--------+--------+------------------+---------+ ICA Mid   109     14                                tortuous  +----------+--------+--------+--------+------------------+---------+ ICA Distal67      9                                 tortuous  +----------+--------+--------+--------+------------------+---------+ ECA       117     2                                 tortuous  +----------+--------+--------+--------+------------------+---------+ +----------+--------+-------+--------+-------------------+           PSV cm/sEDV cmsDescribeArm Pressure (mmHG) +----------+--------+-------+--------+-------------------+ [id number]                                        +----------+--------+-------+--------+-------------------+ +---------+--------+--+--------+-+  VertebralPSV cm/s38EDV cm/s2 +---------+--------+--+--------+-+  Left Carotid Findings: +----------+--------+--------+--------+----------------------+--------+           PSV cm/sEDV cm/sStenosisPlaque Description    Comments +----------+--------+--------+--------+----------------------+--------+ CCA Prox  70      3               heterogenous                   +----------+--------+--------+--------+----------------------+--------+ CCA Distal82      12              heterogenous                    +----------+--------+--------+--------+----------------------+--------+ ICA Prox  118     15      1-39%   heterogenous                   +----------+--------+--------+--------+----------------------+--------+ ICA Mid   116     14                                    tortuous +----------+--------+--------+--------+----------------------+--------+ ICA Distal111     17                                    tortuous +----------+--------+--------+--------+----------------------+--------+ ECA       148     19              irregular and calcific         +----------+--------+--------+--------+----------------------+--------+ +----------+--------+--------+--------+-------------------+           PSV cm/sEDV cm/sDescribeArm Pressure (mmHG) +----------+--------+--------+--------+-------------------+ [id number]                                         +----------+--------+--------+--------+-------------------+ +---------+--------+--+--------+-+ VertebralPSV cm/s75EDV cm/s9 +---------+--------+--+--------+-+   Summary: Right Carotid: Velocities in the right ICA are consistent with a 1-39% stenosis. Left Carotid: Velocities in the left ICA are consistent with a 1-39% stenosis. Vertebrals:  Bilateral vertebral arteries demonstrate antegrade flow. Subclavians: Normal flow hemodynamics were seen in bilateral subclavian              arteries. *See table(s) above for measurements and observations.  Electronically signed by Eather Popp MD on 12/31/2023 at 7:20:51 AM.    Final    ECHOCARDIOGRAM COMPLETE Result Date: 12/30/2023    ECHOCARDIOGRAM REPORT   Patient Name:   Kunaal L Kassem Date of Exam: 12/30/2023 Medical Rec #:  [id number]     Height:       65.0 in Accession #:    [id number]    Weight:       132.9 lb Date of Birth:  [birth year]     BSA:          1.663 m Patient Age:    78 years      BP:           162/89 mmHg Patient Gender: M             HR:           63 bpm. Exam Location:  Inpatient  Procedure: 2D Echo, Cardiac Doppler, Color Doppler and Strain Analysis (Both            Spectral and Color Flow Doppler were utilized during procedure). Indications:  TIA  History:        Patient has prior history of Echocardiogram examinations, most                 recent 09/18/2023. Stroke and COPD, Aortic Valve Disease,                 Signs/Symptoms:Chest Pain, Syncope, Altered Mental Status and                 Alzheimer's; Risk Factors:Hypertension.  Sonographer:    Ellouise Mose RDCS Referring Phys: [id number] CORTNEY E DE LA TORRE IMPRESSIONS  1. Left ventricular ejection fraction, by estimation, is 45 to 50%. The left ventricle has mildly decreased function. The left ventricle has no regional wall motion abnormalities. There is mild concentric left ventricular hypertrophy. Left ventricular diastolic parameters are consistent with Grade II diastolic dysfunction (pseudonormalization).  2. Right ventricular systolic function is normal. The right ventricular size is normal. Tricuspid regurgitation signal is inadequate for assessing PA pressure.  3. Left atrial size was mildly dilated.  4. The mitral valve is degenerative. Trivial mitral valve regurgitation. No evidence of mitral stenosis.  5. The aortic valve is tricuspid. There is moderate calcification of the aortic valve. Aortic valve regurgitation is mild to moderate. Aortic valve sclerosis/calcification is present, without any evidence of aortic stenosis.  6. The inferior vena cava is normal in size with greater than 50% respiratory variability, suggesting right atrial pressure of 3 mmHg. Comparison(s): No significant change from prior study. FINDINGS  Left Ventricle: Left ventricular ejection fraction, by estimation, is 45 to 50%. The left ventricle has mildly decreased function. The left ventricle has no regional wall motion abnormalities. Global longitudinal strain performed but not reported based on interpreter judgement due to suboptimal tracking. The left  ventricular internal cavity size was normal in size. There is mild concentric left ventricular hypertrophy. Left ventricular diastolic parameters are consistent with Grade II diastolic dysfunction (pseudonormalization). Right Ventricle: The right ventricular size is normal. No increase in right ventricular wall thickness. Right ventricular systolic function is normal. Tricuspid regurgitation signal is inadequate for assessing PA pressure. Left Atrium: Left atrial size was mildly dilated. Right Atrium: Right atrial size was normal in size. Pericardium: There is no evidence of pericardial effusion. Mitral Valve: The mitral valve is degenerative in appearance. Mild mitral annular calcification. Trivial mitral valve regurgitation. No evidence of mitral valve stenosis. Tricuspid Valve: The tricuspid valve is normal in structure. Tricuspid valve regurgitation is trivial. No evidence of tricuspid stenosis. Aortic Valve: The aortic valve is tricuspid. There is moderate calcification of the aortic valve. Aortic valve regurgitation is mild to moderate. Aortic valve sclerosis/calcification is present, without any evidence of aortic stenosis. Aortic valve mean gradient measures 6.0 mmHg. Aortic valve peak gradient measures 11.8 mmHg. Aortic valve area, by VTI measures 2.92 cm. Pulmonic Valve: The pulmonic valve was grossly normal. Pulmonic valve regurgitation is trivial. No evidence of pulmonic stenosis. Aorta: The aortic root and ascending aorta are structurally normal, with no evidence of dilitation. Venous: The inferior vena cava is normal in size with greater than 50% respiratory variability, suggesting right atrial pressure of 3 mmHg. IAS/Shunts: The atrial septum is grossly normal.  LEFT VENTRICLE PLAX 2D LVIDd:         5.22 cm      Diastology LVIDs:         3.87 cm      LV e' medial:    3.26 cm/s LV PW:  1.21 cm      LV E/e' medial:  19.8 LV IVS:        1.29 cm      LV e' lateral:   5.11 cm/s LVOT diam:     2.34  cm      LV E/e' lateral: 12.6 LV SV:         104 LV SV Index:   63           2D Longitudinal Strain LVOT Area:     4.30 cm     2D Strain GLS Avg:     -17.1 %  LV Volumes (MOD) LV vol d, MOD A2C: 136.0 ml LV vol d, MOD A4C: 103.0 ml LV vol s, MOD A2C: 64.2 ml LV vol s, MOD A4C: 53.8 ml LV SV MOD A2C:     71.8 ml LV SV MOD A4C:     103.0 ml LV SV MOD BP:      58.5 ml RIGHT VENTRICLE             IVC RV S prime:     20.30 cm/s  IVC diam: 1.23 cm TAPSE (M-mode): 2.0 cm LEFT ATRIUM           Index        RIGHT ATRIUM          Index LA Vol (A2C): 58.5 ml 35.18 ml/m  RA Area:     9.36 cm LA Vol (A4C): 49.8 ml 29.95 ml/m  RA Volume:   17.30 ml 10.40 ml/m  AORTIC VALVE AV Area (Vmax):    2.75 cm AV Area (Vmean):   2.76 cm AV Area (VTI):     2.92 cm AV Vmax:           172.00 cm/s AV Vmean:          109.500 cm/s AV VTI:            0.357 m AV Peak Grad:      11.8 mmHg AV Mean Grad:      6.0 mmHg LVOT Vmax:         110.00 cm/s LVOT Vmean:        70.400 cm/s LVOT VTI:          0.242 m LVOT/AV VTI ratio: 0.68  AORTA Ao Root diam: 3.92 cm Ao Asc diam:  3.99 cm MITRAL VALVE MV Area (PHT): 2.16 cm    SHUNTS MV Decel Time: 352 msec    Systemic VTI:  0.24 m MV E velocity: 64.50 cm/s  Systemic Diam: 2.34 cm MV A velocity: 49.30 cm/s MV E/A ratio:  1.31 Darryle Decent MD Electronically signed by Darryle Decent MD Signature Date/Time: 12/30/2023/2:25:04 PM    Final    MR ANGIO HEAD WO CONTRAST Result Date: 12/30/2023 CLINICAL DATA:  Initial evaluation for stroke/TIA. EXAM: MRA HEAD WITHOUT CONTRAST TECHNIQUE: Angiographic images of the Circle of Willis were acquired using MRA technique without intravenous contrast. COMPARISON:  Comparison made with prior brain MRI from 12/28/2023 FINDINGS: Anterior circulation: Examination moderately degraded by motion artifact. Both internal carotid arteries are patent through the siphons without stenosis or other abnormality. A1 segments patent bilaterally. Normal anterior communicating artery  complex. As a case ACA widely patent without stenosis. No M1 stenosis or occlusion. No proximal MCA branch occlusion. Distal MCA branches perfused and fairly symmetric. Posterior circulation: Both V4 segments patent without stenosis for the vertebral artery dominant. Both PICA patent at their origins. Basilar patent without stenosis. Superior cerebellar  and posterior cerebral arteries patent bilaterally. Anatomic variants: None significant.  No intracranial aneurysm. Other: None. IMPRESSION: Negative intracranial MRA. No large vessel occlusion or hemodynamically significant stenosis. Electronically Signed   By: Morene Hoard M.D.   On: 12/30/2023 01:40   EEG adult Result Date: 12/29/2023 Shelton Arlin KIDD, MD     12/29/2023 11:48 AM Patient Name: KYZER BLOWE MRN: [id number] Epilepsy Attending: Arlin KIDD Shelton Referring Physician/Provider: Patt Alm Macho, MD Date: 12/29/2023 Duration: 23.20 mins Patient history: 78yo M with ams. EEG to evaluate for seizure Level of alertness: Awake AEDs during EEG study: None Technical aspects: This EEG study was done with scalp electrodes positioned according to the 10-20 International system of electrode placement. Electrical activity was reviewed with band pass filter of 1-70Hz , sensitivity of 7 uV/mm, display speed of 109mm/sec with a 60Hz  notched filter applied as appropriate. EEG data were recorded continuously and digitally stored.  Video monitoring was available and reviewed as appropriate. Description: The posterior dominant rhythm consists of 7 Hz activity of moderate voltage (25-35 uV) seen predominantly in posterior head regions, symmetric and reactive to eye opening and eye closing. EEG showed continuous generalized 3 to 6 Hz theta-delta slowing.Hyperventilation and photic stimulation were not performed.   ABNORMALITY - Continuous slow, generalized IMPRESSION: This study is suggestive of mild to moderate diffuse encephalopathy. No seizures or epileptiform  discharges were seen throughout the recording. Arlin KIDD Shelton   CT VENOGRAM HEAD Result Date: 12/28/2023 CLINICAL DATA:  Initial evaluation for dural venous sinus thrombosis. EXAM: CT VENOGRAM HEAD TECHNIQUE: Venographic phase images of the brain were obtained following the administration of intravenous contrast. Multiplanar reformats and maximum intensity projections were generated. RADIATION DOSE REDUCTION: This exam was performed according to the departmental dose-optimization program which includes automated exposure control, adjustment of the mA and/or kV according to patient size and/or use of iterative reconstruction technique. CONTRAST:  75mL OMNIPAQUE  IOHEXOL  350 MG/ML SOLN COMPARISON:  CTs from earlier the same day. FINDINGS: Normal enhancement seen throughout the superior sagittal sinus to the torcula. Transverse and sigmoid sinuses are patent as are the jugular bulbs and visualized internal jugular veins. Well-circumscribed ovoid filling defect at the junction of the right transverse and sigmoid sinuses noted, felt to be consistent with an arachnoid granulation. Straight sinus, vein of Galen, and internal cerebral veins are patent. No evidence for dural venous sinus thrombosis. No appreciable cortical vein abnormality. IMPRESSION: Negative CT venogram. No evidence for dural venous sinus thrombosis. Electronically Signed   By: Morene Hoard M.D.   On: 12/28/2023 21:35   MR BRAIN WO CONTRAST Result Date: 12/28/2023 CLINICAL DATA:  Initial evaluation for acute neuro deficit, stroke suspected. EXAM: MRI HEAD WITHOUT CONTRAST TECHNIQUE: Multiplanar, multiecho pulse sequences of the brain and surrounding structures were obtained without intravenous contrast. COMPARISON:  CTs from earlier the same day. FINDINGS: Brain: Diffuse prominence of the CSF containing spaces compatible generalized cerebral atrophy. Patchy and confluent T2/FLAIR hyperintensity involving the periventricular deep white  matter both cerebral hemispheres, consistent with chronic small vessel ischemic disease, moderately advanced in nature. No abnormal foci of restricted diffusion to suggest acute or subacute ischemia. Gray-white matter differentiation maintained. No acute or significant chronic intracranial blood products. No mass lesion, midline shift or mass effect. Mild ventricular prominence related to global parenchymal volume loss without hydrocephalus. No extra-axial fluid collection. Pituitary gland and suprasellar region within normal limits. Vascular: Major intracranial arterial vascular flow voids are maintained. Asymmetric FLAIR hyperintensity seen involving the right transverse and sigmoid  sinuses extending into the proximal right internal jugular vein, favored to reflect slow/sluggish flow. Skull and upper cervical spine: Craniocervical junction within normal limits. Bone marrow signal intensity overall within normal limits. No scalp soft tissue abnormality. Sinuses/Orbits: Globes orbital soft tissues within normal limits. Right maxillary sinus retention cyst. Paranasal sinuses are otherwise largely clear. No significant mastoid effusion. Other: None. IMPRESSION: 1. No acute intracranial abnormality. 2. Age-related cerebral atrophy with moderately advanced chronic microvascular ischemic disease. 3. Asymmetric FLAIR hyperintensity involving the right transverse and sigmoid sinuses extending into the proximal right internal jugular vein, favored to reflect slow/sluggish flow. If there is clinical concern for possible dural sinus thrombosis, further evaluation with dedicated MRV could be performed for further evaluation. Electronically Signed   By: Morene Hoard M.D.   On: 12/28/2023 19:19   DG Chest 1 View Result Date: 12/28/2023 CLINICAL DATA:  [id number] Altered mental state 881078 EXAM: CHEST  1 VIEW COMPARISON:  Oct 08, 2023 FINDINGS: No focal airspace consolidation, pleural effusion, or pneumothorax. Mild  cardiomegaly. Aortic atherosclerosis. No acute fracture or destructive lesions. Multilevel thoracic osteophytosis. IMPRESSION: No acute cardiopulmonary abnormality. Electronically Signed   By: Rogelia Myers M.D.   On: 12/28/2023 18:59   CT ANGIO HEAD NECK W WO CM (CODE STROKE) Result Date: 12/28/2023 EXAM: CTA HEAD AND NECK WITH AND WITHOUT 12/28/2023 02:11:32 PM TECHNIQUE: CTA of the head and neck was performed with and without the administration of intravenous contrast. Multiplanar 2D and/or 3D reformatted images are provided for review. Automated exposure control, iterative reconstruction, and/or weight based adjustment of the mA/kV was utilized to reduce the radiation dose to as low as reasonably achievable. Stenosis of the internal carotid arteries measured using NASCET criteria. COMPARISON: CT and CTA head and neck dated 12/28/2023. CLINICAL HISTORY: Neuro deficit, acute, stroke suspected. FINDINGS: CTA NECK: AORTIC ARCH AND ARCH VESSELS: Moderate atherosclerosis of the visualized aortic arch. Atherosclerosis of the aortic arch vessel origins without high-grade stenosis. The subclavian arteries are patent bilaterally. CERVICAL CAROTID ARTERIES: Mild atherosclerosis of the right common carotid artery. Bulky calcified atherosclerosis at the carotid bifurcation and along the proximal cervical ICA where there is approximately 55% stenosis. There is tortuosity of the mid right cervical ICA. Mild atherosclerosis of the left common carotid artery. Atherosclerosis at the left carotid bifurcation and along the proximal left cervical ICA where there is approximately 40% stenosis. Mild tortuosity of the mid cervical ICA. CERVICAL VERTEBRAL ARTERIES: Atherosclerosis at the right vertebral artery origin resulting in moderate stenosis. The vertebral arteries are patent from the origins to the vertebrobasilar confluence. Atherosclerosis of the left V4 segment resulting in mild stenosis. LUNGS AND MEDIASTINUM:  Unremarkable. SOFT TISSUES: No acute abnormality. BONES: Degenerative changes in the visualized spine with disc space narrowing most pronounced at C4-5 through C6-7. CTA HEAD: ANTERIOR CIRCULATION: The intracranial internal carotid arteries are patent bilaterally. Atherosclerosis of the carotid siphons resulting in mild stenosis. The MCAs are patent bilaterally. The ACAs are patent bilaterally. POSTERIOR CIRCULATION: The basilar artery is patent. The PCAs are patent bilaterally. The superior cerebellar arteries are patent bilaterally. OTHER: No dural venous sinus thrombosis on this non-dedicated study. IMPRESSION: 1. No large vessel occlusion. 2. 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 3. Atherosclerosis with approximately 40% stenosis at the proximal left cervical ICA. 4. Moderate stenosis at the right vertebral artery origin. 5. Moderate atherosclerosis of the aortic arch. Electronically signed by: Donnice Mania MD 12/28/2023 02:28 PM EDT RP Workstation: [id number]   CT HEAD  CODE STROKE WO CONTRAST Result Date: 12/28/2023 EXAM: CT HEAD WITHOUT 12/28/2023 01:59:30 PM TECHNIQUE: CT of the head was performed without the administration of intravenous contrast. Automated exposure control, iterative reconstruction, and/or weight based adjustment of the mA/kV was utilized to reduce the radiation dose to as low as reasonably achievable. COMPARISON: CT head 09/16/2023 CLINICAL HISTORY: Neuro deficit, acute, stroke suspected. FINDINGS: BRAIN AND VENTRICLES: No acute intracranial hemorrhage. No mass effect or midline shift. No extra-axial fluid collection. Gray-white differentiation is maintained. No hydrocephalus. Nonspecific hypoattenuation in the periventricular and subcortical white matter, most likely representing chronic small vessel disease. Similar appearance of remote cortical infarct in the posterior right frontal lobe. Generalized parenchymal volume loss. No edema, mass  effect, or midline shift. No extraaxial fluid collections. ORBITS: No acute abnormality. SINUSES AND MASTOIDS: Mucosal thickening in the right sphenoid and right maxillary sinuses. SOFT TISSUES AND SKULL: No acute skull fracture. No acute soft tissue abnormality. [place] stroke program early CT (ASPECT) score Ganglionic (caudate, IC, lentiform nucleus, insula, M1-M3): 7 Supraganglionic (M4-M6): 3 Total: 10 IMPRESSION: 1. No acute intracranial abnormality. 2. 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 3. 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 4. Findings messaged to Dr. Arora via the [facility] messaging system at 2:05PM on 12/28/23. Electronically signed by: Donnice Mania MD 12/28/2023 02:06 PM EDT RP Workstation: [id number]    EKG Interpretation Date/Time:  Saturday January 17 2024 00:17:10 EDT Ventricular Rate:  79 PR Interval:  187 QRS Duration:  84 QT Interval:  344 QTC Calculation: 395 R Axis:   60  Text Interpretation: Sinus rhythm Atrial premature complexes Confirmed by Yovana Scogin ([id number]) on 01/17/2024 2:51:50 AM  Radiology: [facility] Chest Port 1 View Result Date: 01/17/2024 CLINICAL DATA:  Sepsis, fever EXAM: PORTABLE CHEST 1 VIEW COMPARISON:  12/28/2023, 09/16/2023 FINDINGS: The lungs are symmetrically well expanded. Stable nodular density at the right apex. No confluent pulmonary infiltrate. No pneumothorax or pleural effusion. Cardiac size is at the upper limits of normal. Pulmonary vascularity is normal. Osseous structures are age-appropriate. No acute bone abnormality. IMPRESSION: 1. No active disease. 2. Stable nodular density at the right apex. This could be better assessed with dedicated nonemergent CT imaging. Electronically Signed   By: Dorethia Molt M.D.   On: 01/17/2024 00:46     .Critical Care  Performed by: Nettie Earing, MD Authorized by: Nettie Earing, MD   Critical care provider statement:    Critical care time (minutes):   30   Critical care end time:  01/17/2024 4:07 AM   Critical care time was exclusive of:  Separately billable procedures and treating other patients   Critical care was necessary to treat or prevent imminent or life-threatening deterioration of the following conditions:  Sepsis   Critical care was time spent personally by me on the following activities:  Development of treatment plan with patient or surrogate, discussions with consultants, evaluation of patient's response to treatment, examination of patient, ordering and review of laboratory studies, ordering and review of radiographic studies, ordering and performing treatments and interventions, pulse oximetry, re-evaluation of patient's condition and review of old charts   I assumed direction of critical care for this patient from another provider in my specialty: no     Care discussed with: admitting provider      Medications Ordered in the ED  lactated ringers  infusion (150 mL/hr Intravenous New Bag/Given 01/17/24 0140)  lactated ringers  bolus 500 mL (has no administration in time range)  ceFEPIme  (MAXIPIME )  2 g in sodium chloride  0.9 % 100 mL IVPB (0 g Intravenous Stopped 01/17/24 0127)  metroNIDAZOLE  (FLAGYL ) IVPB 500 mg (0 mg Intravenous Stopped 01/17/24 0200)  vancomycin  (VANCOCIN ) IVPB 1000 mg/200 mL premix (1,000 mg Intravenous New Bag/Given 01/17/24 0138)                                    Medical Decision Making Patient with cough and covid exposure   Amount and/or Complexity of Data Reviewed Independent Historian: EMS    Details: See above  External Data Reviewed: notes.    Details: Previous notes reviewed  Labs: ordered.    Details: Negative urine, negative covid and flu white count slight elevation 11.6, low hemoglobin 11.4, normal coagulation studies, normal sodium 139, normal potassium 4 Radiology: ordered and independent interpretation performed.    Details: No CHF ECG/medicine tests: ordered and independent  interpretation performed. Decision-making details documented in ED Course.  Risk Prescription drug management. Decision regarding hospitalization.     Final diagnoses:  SIRS (systemic inflammatory response syndrome) (HCC)  COVID-19   The patient appears reasonably stabilized for admission considering the current resources, flow, and capabilities available in the ED at this time, and I doubt any other [facility] requiring further screening and/or treatment in the ED prior to admission.  ED Discharge Orders     None          Milea Klink, MD 01/17/24 9589

## 2024-01-17 NOTE — Progress Notes (Signed)
 Elink monitoring for the code sepsis protocol.

## 2024-01-17 NOTE — Hospital Course (Addendum)
  78 year old man CVA, dementia, hypertension, hyperlipidemia, and essential hypertension presented emergency department from [facility] memory care for evaluation for high temperature and facility concerned that patient might contracted COVID.  Patient reported that he has nasal congestion and cough at the nursing home everyone got COVID.  At presentation to ED patient found febrile otherwise hemodynamically stable.  O2 sat 97% on 2 L oxygen. Respiratory panel negative for COVID/RSV/flu. CBC showing leukocytosis 11.6, stable H&H and normal platelet count. CMP unremarkable.  Low lactic acid level.  But blood culture still pending.  Normal pro time INR.  UA showed evidence of UTI.  Chest x-ray no active disease process.  In the ED patient has been given 500 mL of LR bolus and currently maintenance fluid LR 150 cc/h.  Also received Vanco, metronidazole  and cefepime .  Unknown source of infection at this time. Dr. Nettie will do reswab again given patient is symptomatic and concern for false positive initial result. Patient also met SIRS criteria.  Hospitalist has been consulted for further evaluation management of fever of unknown origin.

## 2024-01-18 DIAGNOSIS — G9341 Metabolic encephalopathy: Secondary | ICD-10-CM | POA: Diagnosis not present

## 2024-01-18 DIAGNOSIS — R6510 Systemic inflammatory response syndrome (SIRS) of non-infectious origin without acute organ dysfunction: Secondary | ICD-10-CM | POA: Diagnosis not present

## 2024-01-18 LAB — RENAL FUNCTION PANEL
Albumin: 3.2 g/dL — ABNORMAL LOW (ref 3.5–5.0)
Anion gap: 10 (ref 5–15)
BUN: 16 mg/dL (ref 8–23)
CO2: 24 mmol/L (ref 22–32)
Calcium: 9.9 mg/dL (ref 8.9–10.3)
Chloride: 107 mmol/L (ref 98–111)
Creatinine, Ser: 0.89 mg/dL (ref 0.61–1.24)
GFR, Estimated: >60 mL/min (ref >60)
Glucose, Bld: 90 mg/dL (ref 70–99)
Phosphorus: 2.4 mg/dL — ABNORMAL LOW (ref 2.5–4.6)
Potassium: 4.1 mmol/L (ref 3.5–5.1)
Sodium: 141 mmol/L (ref 135–145)

## 2024-01-18 LAB — CBC
HCT: 37.9 % — ABNORMAL LOW (ref 39.0–52.0)
Hemoglobin: 11.6 g/dL — ABNORMAL LOW (ref 13.0–17.0)
MCH: 27.1 pg (ref 26.0–34.0)
MCHC: 30.6 g/dL (ref 30.0–36.0)
MCV: 88.6 fL (ref 80.0–100.0)
Platelets: 233 K/uL (ref 150–400)
RBC: 4.28 MIL/uL (ref 4.22–5.81)
RDW: 15.5 % (ref 11.5–15.5)
WBC: 10.2 K/uL (ref 4.0–10.5)
nRBC: 0 % (ref 0.0–0.2)

## 2024-01-18 MED ORDER — CARVEDILOL 6.25 MG PO TABS
6.2500 mg | ORAL_TABLET | Freq: Two times a day (BID) | ORAL | Status: DC
  Administered 2024-01-18 – 2024-01-20 (×4): 6.25 mg via ORAL
  Filled 2024-01-18 (×4): qty 1

## 2024-01-18 NOTE — Progress Notes (Signed)
   01/18/24 0700  Provider Notification  Provider Name/Title Blondie NP  Date Provider Notified 01/18/24  Time Provider Notified 0703  Method of Notification Page  Notification Reason Other (Comment) (6 [facility] @ [phone])  Provider response No new orders  Date of Provider Response 01/18/24  Time of Provider Response [phone]

## 2024-01-18 NOTE — Progress Notes (Signed)
 [facility]                                                                              Dennis [name], is a 78 y.o. male, DOB - Sep 12, 1945, FMW:[id number] Admit date - 01/16/2024    Outpatient Primary MD for the patient is Pcp, No  LOS - 1  days  Chief Complaint  Patient presents with   Fever       Brief summary   Patient is a 78 year old male with history of CVA, dementia, HTN, and hyperlipidemia, recent admission 12/28/2023-12/30/2023 (for TIA) and acute encephalopathy presented from [name] memory care facility due to fevers.  Patient has dementia and unable to provide reliable history.  Per EMS and ED records, COVID was going around in the facility and patient was noted to have fevers 101.3 F, cough and rhinorrhea.  No ongoing nausea vomiting, chest pain, acute shortness of breath or any diarrhea. In ED temp 101.6 F, RR 21, BP 148/72, O2 sats 97% on 2 L, WBCs 11.6, COVID, RSV, flu negative, repeated again Chest x-ray showed no active disease, stable nodular density at the right apex  Assessment & Plan       Sepsis (HCC) likely has acute viral respiratory infection/HCAP - Presented with fever, mild leukocytosis, hypotension, BP 100/53, no lactic acidosis - COVID, RSV, flu negative, repeated again.  Respiratory virus panel negative.  - Follow blood cultures, urine Legionella antigen, urine strep antigen negative - Leukocytosis improved - Mild aspiration risk on SLP evaluation - Placed on IV vancomycin , IV cefepime  and Flagyl     Acute metabolic encephalopathy, superimposed on dementia - Unclear baseline, patient is alert and oriented to self, pleasant, follows commands however unable to provide reliable history.  UA negative for UTI.  Likely due to #1. - Will continue Seroquel , paroxetine - Delirium precautions -Appears close to his baseline     HLD (hyperlipidemia) -Continue statin     HTN (hypertension) - BP elevated, increase Coreg  to 6.25  mg twice daily    History of stroke -Patient had a recent admission, was recommended aspirin  and Plavix  for 3 weeks, aspirin  till 01/20/2024 and then continue Plavix  -Continue statin, no acute neurological deficits     BPH (benign prostatic hyperplasia) -Continue finasteride , Flomax    GERD -Continue PPI  Estimated body mass index is 22.71 kg/m as calculated from the following:   Height as of this encounter: 5' 5 (1.651 m).   Weight as of this encounter: 61.9 kg.  Code Status: Full CODE STATUS DVT Prophylaxis:  enoxaparin  (LOVENOX ) injection 40 mg Start: 01/17/24 1645   Level of Care: Level of care: Telemetry Family Communication:  Disposition Plan:      Remains inpatient appropriate: If no fevers in the next 24 hours and improving, will DC back to SNF tomorrow   Procedures:    Consultants:     Antimicrobials:   Anti-infectives (From admission, onward)    Start     Dose/Rate Route Frequency Ordered Stop   01/18/24 0000  vancomycin  (VANCOCIN ) IVPB 1000 mg/200 mL premix        1,000 mg 200 mL/hr over 60 Minutes Intravenous  Every 24 hours 01/17/24 0755     01/17/24 1645  metroNIDAZOLE  (FLAGYL ) IVPB 500 mg        500 mg 100 mL/hr over 60 Minutes Intravenous 2 times daily 01/17/24 1620     01/17/24 1000  ceFEPIme  (MAXIPIME ) 2 g in sodium chloride  0.9 % 100 mL IVPB        2 g 200 mL/hr over 30 Minutes Intravenous Every 12 hours 01/17/24 0751     01/17/24 0015  ceFEPIme  (MAXIPIME ) 2 g in sodium chloride  0.9 % 100 mL IVPB        2 g 200 mL/hr over 30 Minutes Intravenous  Once 01/17/24 0009 01/17/24 0127   01/17/24 0015  metroNIDAZOLE  (FLAGYL ) IVPB 500 mg        500 mg 100 mL/hr over 60 Minutes Intravenous  Once 01/17/24 0009 01/17/24 0200   01/17/24 0015  vancomycin  (VANCOCIN ) IVPB 1000 mg/200 mL premix        1,000 mg 200 mL/hr over 60 Minutes Intravenous  Once 01/17/24 0009 01/17/24 0238          Medications  aspirin  EC  81 mg Oral Daily   atorvastatin   40 mg  Oral Daily   carvedilol   3.125 mg Oral BID WC   clopidogrel   75 mg Oral Daily   docusate sodium   100 mg Oral BID   enoxaparin  (LOVENOX ) injection  40 mg Subcutaneous Daily   finasteride   5 mg Oral Daily   FLUoxetine   20 mg Oral Daily   folic acid   1 mg Oral Daily   pantoprazole   40 mg Oral Daily   QUEtiapine   50 mg Oral QHS   senna-docusate  1 tablet Oral QHS   tamsulosin   0.4 mg Oral Daily      Subjective:   Dennis [name] was seen and examined today.  BP uncontrolled otherwise improving, no acute chest pain, shortness of breath, fever chills or wheezing.  Has dementia.  Objective:   Vitals:   01/17/24 1550 01/17/24 2150 01/18/24 0537 01/18/24 0656  BP: (!) 155/70 (!) 165/84 (!) 170/72 (!) 163/68  Pulse: 74 86 76 68  Resp:  18 19   Temp: 98.5 F (36.9 C) 98.4 F (36.9 C) 98.2 F (36.8 C)   TempSrc: Oral Oral Oral   SpO2: 94% 95% 93% 94%  Weight:      Height:        Intake/Output Summary (Last 24 hours) at 01/18/2024 0914 Last data filed at 01/18/2024 0502 Gross per 24 hour  Intake 2160.89 ml  Output 300 ml  Net 1860.89 ml     Wt Readings from Last 3 Encounters:  01/17/24 61.9 kg  12/30/23 60.3 kg  10/21/23 60.8 kg   Physical Exam General: Alert and oriented x self, NAD Cardiovascular: S1 S2 clear, RRR.  Respiratory: Diminished breath sound at the bases, fairly clear Gastrointestinal: Soft, nontender, nondistended, NBS Ext: no pedal edema bilaterally Neuro: no new deficits Psych: Has dementia, pleasant      Data Reviewed:  I have personally reviewed following labs    CBC Lab Results  Component Value Date   WBC 10.2 01/18/2024   RBC 4.28 01/18/2024   HGB 11.6 (L) 01/18/2024   HCT 37.9 (L) 01/18/2024   MCV 88.6 01/18/2024   MCH 27.1 01/18/2024   PLT 233 01/18/2024   MCHC 30.6 01/18/2024   RDW 15.5 01/18/2024   LYMPHSABS 1.6 01/17/2024   MONOABS 1.3 (H) 01/17/2024   EOSABS 0.2 01/17/2024   BASOSABS 0.0  01/17/2024     Last metabolic  panel Lab Results  Component Value Date   NA 141 01/18/2024   K 4.1 01/18/2024   CL 107 01/18/2024   CO2 24 01/18/2024   BUN 16 01/18/2024   CREATININE 0.89 01/18/2024   GLUCOSE 90 01/18/2024   GFRNONAA >60 01/18/2024   GFRAA  11/20/2006    >60        The eGFR has been calculated using the MDRD equation. This calculation has not been validated in all clinical   CALCIUM  9.9 01/18/2024   PHOS 2.4 (L) 01/18/2024   PROT 6.5 01/17/2024   ALBUMIN 3.2 (L) 01/18/2024   BILITOT 0.3 01/17/2024   ALKPHOS 37 (L) 01/17/2024   AST 20 01/17/2024   ALT 13 01/17/2024   ANIONGAP 10 01/18/2024    CBG (last 3)  No results for input(s): GLUCAP in the last 72 hours.    Coagulation Profile: Recent Labs  Lab 01/17/24 0052  INR 1.1     Radiology Studies: I have personally reviewed the imaging studies  DG Chest Port 1 View Result Date: 01/17/2024 CLINICAL DATA:  Sepsis, fever EXAM: PORTABLE CHEST 1 VIEW COMPARISON:  12/28/2023, 09/16/2023 FINDINGS: The lungs are symmetrically well expanded. Stable nodular density at the right apex. No confluent pulmonary infiltrate. No pneumothorax or pleural effusion. Cardiac size is at the upper limits of normal. Pulmonary vascularity is normal. Osseous structures are age-appropriate. No acute bone abnormality. IMPRESSION: 1. No active disease. 2. Stable nodular density at the right apex. This could be better assessed with dedicated nonemergent CT imaging. Electronically Signed   By: Dorethia Molt M.D.   On: 01/17/2024 00:46       Afua Hoots M.D. [facility] 01/18/2024, 9:14 AM  Available via Epic secure chat 7am-7pm After 7 pm, please refer to night coverage provider listed on amion.

## 2024-01-19 ENCOUNTER — Encounter (HOSPITAL_COMMUNITY): Payer: Self-pay | Admitting: Internal Medicine

## 2024-01-19 DIAGNOSIS — R6510 Systemic inflammatory response syndrome (SIRS) of non-infectious origin without acute organ dysfunction: Secondary | ICD-10-CM | POA: Diagnosis not present

## 2024-01-19 DIAGNOSIS — F03918 Unspecified dementia, unspecified severity, with other behavioral disturbance: Secondary | ICD-10-CM | POA: Diagnosis not present

## 2024-01-19 DIAGNOSIS — G9341 Metabolic encephalopathy: Secondary | ICD-10-CM | POA: Diagnosis not present

## 2024-01-19 DIAGNOSIS — N400 Enlarged prostate without lower urinary tract symptoms: Secondary | ICD-10-CM | POA: Diagnosis not present

## 2024-01-19 LAB — RENAL FUNCTION PANEL
Albumin: 3.1 g/dL — ABNORMAL LOW (ref 3.5–5.0)
Anion gap: 10 (ref 5–15)
BUN: 21 mg/dL (ref 8–23)
CO2: 24 mmol/L (ref 22–32)
Calcium: 9.2 mg/dL (ref 8.9–10.3)
Chloride: 107 mmol/L (ref 98–111)
Creatinine, Ser: 1.23 mg/dL (ref 0.61–1.24)
GFR, Estimated: 60 mL/min — ABNORMAL LOW (ref >60)
Glucose, Bld: 108 mg/dL — ABNORMAL HIGH (ref 70–99)
Phosphorus: 2.5 mg/dL (ref 2.5–4.6)
Potassium: 4.1 mmol/L (ref 3.5–5.1)
Sodium: 141 mmol/L (ref 135–145)

## 2024-01-19 LAB — CBC
HCT: 36.7 % — ABNORMAL LOW (ref 39.0–52.0)
Hemoglobin: 11.5 g/dL — ABNORMAL LOW (ref 13.0–17.0)
MCH: 27.8 pg (ref 26.0–34.0)
MCHC: 31.3 g/dL (ref 30.0–36.0)
MCV: 88.9 fL (ref 80.0–100.0)
Platelets: 261 K/uL (ref 150–400)
RBC: 4.13 MIL/uL — ABNORMAL LOW (ref 4.22–5.81)
RDW: 15.5 % (ref 11.5–15.5)
WBC: 9.5 K/uL (ref 4.0–10.5)
nRBC: 0 % (ref 0.0–0.2)

## 2024-01-19 LAB — LEGIONELLA PNEUMOPHILA SEROGP 1 UR AG: L. pneumophila Serogp 1 Ur Ag: NEGATIVE

## 2024-01-19 NOTE — Progress Notes (Signed)
 [facility]                                                                              Dennis [name], is a 78 y.o. male, DOB - 11-15-1945, FMW:[id number] Admit date - 01/16/2024    Outpatient Primary MD for the patient is Pcp, No  LOS - 2  days  Chief Complaint  Patient presents with   Fever       Brief summary   Patient is a 78 year old male with history of CVA, dementia, HTN, and hyperlipidemia, recent admission 12/28/2023-12/30/2023 (for TIA) and acute encephalopathy presented from [facility] memory care facility due to fevers.  Patient has dementia and unable to provide reliable history.  Per EMS and ED records, COVID was going around in the facility and patient was noted to have fevers 101.3 F, cough and rhinorrhea.  No ongoing nausea vomiting, chest pain, acute shortness of breath or any diarrhea. In ED temp 101.6 F, RR 21, BP 148/72, O2 sats 97% on 2 L, WBCs 11.6, COVID, RSV, flu negative, repeated again Chest x-ray showed no active disease, stable nodular density at the right apex  Assessment & Plan       Sepsis (HCC) likely has acute viral respiratory infection/HCAP - Presented with fever, mild leukocytosis, hypotension, BP 100/53, no lactic acidosis - COVID, RSV, flu negative, repeated again.  Respiratory virus panel negative.  - Blood cultures NTD, urine strep antigen negative - Leukocytosis improved - Mild aspiration risk on SLP evaluation - Continue IV vancomycin , IV cefepime  and Flagyl , overall improving    Acute metabolic encephalopathy, superimposed on dementia - Unclear baseline, patient is alert and oriented to self, pleasant, follows commands however unable to provide reliable history.  UA negative for UTI.  Likely due to #1. - Will continue Seroquel , paroxetine - Delirium precautions -Appears close to his baseline     HLD (hyperlipidemia) -Continue statin     HTN (hypertension) - Continue Coreg  6.25 mg twice daily     History  of stroke -Patient had a recent admission, was recommended aspirin  and Plavix  for 3 weeks, aspirin  till 01/20/2024 and then continue Plavix  -Continue statin, no acute neurological deficits     BPH (benign prostatic hyperplasia) -Continue finasteride , Flomax    GERD -Continue PPI  Estimated body mass index is 22.71 kg/m as calculated from the following:   Height as of this encounter: 5' 5 (1.651 m).   Weight as of this encounter: 61.9 kg.  Code Status: Full CODE STATUS DVT Prophylaxis:  enoxaparin  (LOVENOX ) injection 40 mg Start: 01/17/24 1645   Level of Care: Level of care: Telemetry Family Communication:  Disposition Plan:      Remains inpatient appropriate:   Procedures:    Consultants:     Antimicrobials:   Anti-infectives (From admission, onward)    Start     Dose/Rate Route Frequency Ordered Stop   01/18/24 0000  vancomycin  (VANCOCIN ) IVPB 1000 mg/200 mL premix  Status:  Discontinued        1,000 mg 200 mL/hr over 60 Minutes Intravenous Every 24 hours 01/17/24 0755 01/18/24 1238   01/17/24 1645  metroNIDAZOLE  (FLAGYL ) IVPB 500  mg        500 mg 100 mL/hr over 60 Minutes Intravenous 2 times daily 01/17/24 1620     01/17/24 1000  ceFEPIme  (MAXIPIME ) 2 g in sodium chloride  0.9 % 100 mL IVPB        2 g 200 mL/hr over 30 Minutes Intravenous Every 12 hours 01/17/24 0751     01/17/24 0015  ceFEPIme  (MAXIPIME ) 2 g in sodium chloride  0.9 % 100 mL IVPB        2 g 200 mL/hr over 30 Minutes Intravenous  Once 01/17/24 0009 01/17/24 0127   01/17/24 0015  metroNIDAZOLE  (FLAGYL ) IVPB 500 mg        500 mg 100 mL/hr over 60 Minutes Intravenous  Once 01/17/24 0009 01/17/24 0200   01/17/24 0015  vancomycin  (VANCOCIN ) IVPB 1000 mg/200 mL premix        1,000 mg 200 mL/hr over 60 Minutes Intravenous  Once 01/17/24 0009 01/17/24 0238          Medications  aspirin  EC  81 mg Oral Daily   atorvastatin   40 mg Oral Daily   carvedilol   6.25 mg Oral BID WC   clopidogrel   75 mg Oral  Daily   docusate sodium   100 mg Oral BID   enoxaparin  (LOVENOX ) injection  40 mg Subcutaneous Daily   finasteride   5 mg Oral Daily   FLUoxetine   20 mg Oral Daily   folic acid   1 mg Oral Daily   pantoprazole   40 mg Oral Daily   QUEtiapine   50 mg Oral QHS   senna-docusate  1 tablet Oral QHS   tamsulosin   0.4 mg Oral Daily      Subjective:   Dennis [name] was seen and examined today.  Has dementia, confused, no acute issues overnight.  No fever or chills, chest pain or shortness of breath.    Objective:   Vitals:   01/18/24 0656 01/18/24 1418 01/18/24 2030 01/19/24 0529  BP: (!) 163/68 132/64 (!) 143/74 (!) 158/83  Pulse: 68 75 78 70  Resp:  19 15 15   Temp:  99.2 F (37.3 C) 98.1 F (36.7 C) 97.8 F (36.6 C)  TempSrc:  Oral Oral Oral  SpO2: 94% 93% 96% 94%  Weight:      Height:        Intake/Output Summary (Last 24 hours) at 01/19/2024 1149 Last data filed at 01/19/2024 0500 Gross per 24 hour  Intake 630.82 ml  Output 1150 ml  Net -519.18 ml     Wt Readings from Last 3 Encounters:  01/17/24 61.9 kg  12/30/23 60.3 kg  10/21/23 60.8 kg   Physical Exam General: Alert and oriented x self, NAD Cardiovascular: S1 S2 clear, RRR.  Respiratory: CTAB Gastrointestinal: Soft, nontender, nondistended, NBS Ext: no pedal edema bilaterally Neuro: Moving all 4 extremities Psych: Dementia    Data Reviewed:  I have personally reviewed following labs    CBC Lab Results  Component Value Date   WBC 9.5 01/19/2024   RBC 4.13 (L) 01/19/2024   HGB 11.5 (L) 01/19/2024   HCT 36.7 (L) 01/19/2024   MCV 88.9 01/19/2024   MCH 27.8 01/19/2024   PLT 261 01/19/2024   MCHC 31.3 01/19/2024   RDW 15.5 01/19/2024   LYMPHSABS 1.6 01/17/2024   MONOABS 1.3 (H) 01/17/2024   EOSABS 0.2 01/17/2024   BASOSABS 0.0 01/17/2024     Last metabolic panel Lab Results  Component Value Date   NA 141 01/19/2024   K 4.1 01/19/2024  CL 107 01/19/2024   CO2 24 01/19/2024   BUN 21 01/19/2024    CREATININE 1.23 01/19/2024   GLUCOSE 108 (H) 01/19/2024   GFRNONAA 60 (L) 01/19/2024   GFRAA  11/20/2006    >60        The eGFR has been calculated using the MDRD equation. This calculation has not been validated in all clinical   CALCIUM  9.2 01/19/2024   PHOS 2.5 01/19/2024   PROT 6.5 01/17/2024   ALBUMIN 3.1 (L) 01/19/2024   BILITOT 0.3 01/17/2024   ALKPHOS 37 (L) 01/17/2024   AST 20 01/17/2024   ALT 13 01/17/2024   ANIONGAP 10 01/19/2024    CBG (last 3)  No results for input(s): GLUCAP in the last 72 hours.    Coagulation Profile: Recent Labs  Lab 01/17/24 0052  INR 1.1     Radiology Studies: I have personally reviewed the imaging studies  No results found.      Nydia Distance M.D. [facility] 01/19/2024, 11:49 AM  Available via Epic secure chat 7am-7pm After 7 pm, please refer to night coverage provider listed on amion.

## 2024-01-19 NOTE — TOC Progression Note (Signed)
 Transition of Care [facility]) - Progression Note    Patient Details  Name: DAAIEL STARLIN MRN: [id number] Date of Birth: 1945-10-16  Transition of Care [facility]) CM/SW Contact  Tawni CHRISTELLA Eva, LCSW Phone Number: 01/19/2024, 2:15 PM  Clinical Narrative:     CSW attempted to reach [facility] to discuss discharge planning. There was no answer, and a voicemail could not be left as the phone hangs up after several rings. CSW attempted to call several times. IP care management to follow.  Expected Discharge Plan: Memory Care Barriers to Discharge: Continued Medical Work up               Expected Discharge Plan and Services                                               Social Drivers of Health (SDOH) Interventions SDOH Screenings   Food Insecurity: No Food Insecurity (09/17/2023)  Housing: Low Risk  (09/17/2023)  Transportation Needs: No Transportation Needs (09/17/2023)  Utilities: Not At Risk (09/17/2023)  Social Connections: Moderately Isolated (09/17/2023)  Tobacco Use: Medium Risk (01/19/2024)    Readmission Risk Interventions     No data to display

## 2024-01-20 DIAGNOSIS — R6510 Systemic inflammatory response syndrome (SIRS) of non-infectious origin without acute organ dysfunction: Secondary | ICD-10-CM | POA: Diagnosis not present

## 2024-01-20 DIAGNOSIS — N400 Enlarged prostate without lower urinary tract symptoms: Secondary | ICD-10-CM | POA: Diagnosis not present

## 2024-01-20 DIAGNOSIS — G9341 Metabolic encephalopathy: Secondary | ICD-10-CM | POA: Diagnosis not present

## 2024-01-20 LAB — CBC
HCT: 38.4 % — ABNORMAL LOW (ref 39.0–52.0)
Hemoglobin: 11.9 g/dL — ABNORMAL LOW (ref 13.0–17.0)
MCH: 27.7 pg (ref 26.0–34.0)
MCHC: 31 g/dL (ref 30.0–36.0)
MCV: 89.3 fL (ref 80.0–100.0)
Platelets: 277 K/uL (ref 150–400)
RBC: 4.3 MIL/uL (ref 4.22–5.81)
RDW: 15.5 % (ref 11.5–15.5)
WBC: 10.5 K/uL (ref 4.0–10.5)
nRBC: 0 % (ref 0.0–0.2)

## 2024-01-20 LAB — RENAL FUNCTION PANEL
Albumin: 3.3 g/dL — ABNORMAL LOW (ref 3.5–5.0)
Anion gap: 9 (ref 5–15)
BUN: 19 mg/dL (ref 8–23)
CO2: 26 mmol/L (ref 22–32)
Calcium: 9.7 mg/dL (ref 8.9–10.3)
Chloride: 106 mmol/L (ref 98–111)
Creatinine, Ser: 0.94 mg/dL (ref 0.61–1.24)
GFR, Estimated: >60 mL/min (ref >60)
Glucose, Bld: 91 mg/dL (ref 70–99)
Phosphorus: 2.7 mg/dL (ref 2.5–4.6)
Potassium: 4.5 mmol/L (ref 3.5–5.1)
Sodium: 141 mmol/L (ref 135–145)

## 2024-01-20 MED ORDER — CARVEDILOL 6.25 MG PO TABS: 3.1250 mg | ORAL_TABLET | Freq: Two times a day (BID) | ORAL | 3 refills | Status: DC

## 2024-01-20 MED ORDER — CARVEDILOL 6.25 MG PO TABS
3.1250 mg | ORAL_TABLET | Freq: Two times a day (BID) | ORAL | 3 refills | Status: DC
Start: 2024-01-20 — End: 2024-01-20

## 2024-01-20 MED ORDER — AMOXICILLIN-POT CLAVULANATE 875-125 MG PO TABS: 1.0000 | ORAL_TABLET | Freq: Two times a day (BID) | ORAL | 0 refills | Status: AC

## 2024-01-20 MED ORDER — AMOXICILLIN-POT CLAVULANATE 875-125 MG PO TABS: 1.0000 | ORAL_TABLET | Freq: Two times a day (BID) | ORAL | 0 refills | Status: DC

## 2024-01-20 MED ORDER — AMOXICILLIN-POT CLAVULANATE 875-125 MG PO TABS
1.0000 | ORAL_TABLET | Freq: Two times a day (BID) | ORAL | Status: DC
  Administered 2024-01-20: 1 via ORAL
  Filled 2024-01-20: qty 1

## 2024-01-20 NOTE — TOC Transition Note (Signed)
 Transition of Care [facility]) - Discharge Note   Patient Details  Name: Dennis [name] MRN: [id number] Date of Birth: [birth year]  Transition of Care [facility]) CM/SW Contact:  Tawni CHRISTELLA Eva, LCSW Phone Number: 01/20/2024, 9:25 AM   Clinical Narrative:    CSW spoke with Cristal with [name] she is requesting to review pt's d/c summary and Fl2 before pt can return. FL2 and d/c summary has been faxed for review.    CSW spoke with pt's LG [employer] to inform her pt is discharging back to facility today and is requesting PTAR transport   10:28am CSW received call back from [facility], she reported the pt can return to facility today.  CSW attempted to call pt's LG no answer left VM informed her of pt's d/c . RN to call report to  [phone]. IP care management sig off.    Final next level of care: Assisted Living Barriers to Discharge: Barriers Resolved   Patient Goals and CMS Choice Patient states their goals for this hospitalization and ongoing recovery are:: retrun to memory Care CMS Medicare.gov Compare Post Acute Care list provided to:: Legal Guardian        Discharge Placement                  Name of family member notified: search suzen Doctor, hospital Guardian)  [phone] (Mobile) Patient and family notified of of transfer: 01/20/24  Discharge Plan and Services Additional resources added to the After Visit Summary for                                       Social Drivers of Health (SDOH) Interventions SDOH Screenings   Food Insecurity: No Food Insecurity (09/17/2023)  Housing: Low Risk  (09/17/2023)  Transportation Needs: No Transportation Needs (09/17/2023)  Utilities: Not At Risk (09/17/2023)  Social Connections: Moderately Isolated (09/17/2023)  Tobacco Use: Medium Risk (01/19/2024)     Readmission Risk Interventions     No data to display

## 2024-01-20 NOTE — Discharge Summary (Signed)
 Physician Discharge Summary   Patient: Dennis [name] MRN: [id number] DOB: [birth year]  Admit date:     01/16/2024  Discharge date: 01/20/24  Discharge Physician: Nydia Distance, MD    PCP: Pcp, No   Recommendations at discharge:   Coreg  increased to 6.25 mg p.o. twice daily  Augmentin  875-125 mg p.o. twice daily for 4 more days  Discharge Diagnoses:    Sepsis (HCC) Acute respiratory illness/pneumonia   HLD (hyperlipidemia)   HTN (hypertension)   BPH (benign prostatic hyperplasia)   Dementia with behavioral disturbance (HCC)   Acute metabolic encephalopathy   History of stroke   Hospital Course:  Patient is a 78 year old male with history of CVA, dementia, HTN, and hyperlipidemia, recent admission 12/28/2023-12/30/2023 (for TIA) and acute encephalopathy presented from [facility] memory care facility due to fevers.  Patient has dementia and unable to provide reliable history.  Per EMS and ED records, COVID was going around in the facility and patient was noted to have fevers 101.3 F, cough and rhinorrhea.  No ongoing nausea vomiting, chest pain, acute shortness of breath or any diarrhea. In ED temp 101.6 F, RR 21, BP 148/72, O2 sats 97% on 2 L, WBCs 11.6, COVID, RSV, flu negative, repeated again Chest x-ray showed no active disease, stable nodular density at the right apex  Assessment and Plan:  Sepsis (HCC) likely has acute viral respiratory infection/HCAP - Presented with fever, mild leukocytosis, hypotension, BP 100/53, no lactic acidosis - COVID, RSV, flu negative, repeated again.  Respiratory virus panel negative.  - Blood cultures NTD, urine strep antigen negative - Leukocytosis improved - Mild aspiration risk on SLP evaluation - Placed on IV vancomycin , cefepime , Flagyl , transition to oral Augmentin  at discharge for 4 more days     Acute metabolic encephalopathy, superimposed on dementia - Unclear baseline, patient is alert and oriented to self, pleasant, follows  commands however unable to provide reliable history.  UA negative for UTI.  Likely due to #1. - Will continue Seroquel , paroxetine - Delirium precautions -Appears close to his baseline     HLD (hyperlipidemia) -Continue statin     HTN (hypertension) - Continue Coreg  6.25 mg twice daily     History of stroke -Patient had a recent admission, was recommended aspirin  and Plavix  for 3 weeks, aspirin  till 01/20/2024 and then continue Plavix  -Continue statin, no acute neurological deficits     BPH (benign prostatic hyperplasia) -Continue finasteride , Flomax    GERD -Continue PPI   Estimated body mass index is 22.71 kg/m as calculated from the following:   Height as of this encounter: 5' 5 (1.651 m).   Weight as of this encounter: 61.9 kg.       Pain control - [place]  Controlled Substance Reporting System database was reviewed. and patient was instructed, not to drive, operate heavy machinery, perform activities at heights, swimming or participation in water  activities or provide baby-sitting services while on Pain, Sleep and Anxiety Medications; until their outpatient Physician has advised to do so again. Also recommended to not to take more than prescribed Pain, Sleep and Anxiety Medications.  Consultants: None Procedures performed: None Disposition: Assisted living Diet recommendation:  Discharge Diet Orders (From admission, onward)     Start     Ordered   01/20/24 0000  Diet - low sodium heart healthy        01/20/24 0910            DISCHARGE MEDICATION: Allergies as of 01/20/2024  Reactions   Codeine    Shellfish Allergy         Medication List     TAKE these medications    amoxicillin -clavulanate 875-125 MG tablet Commonly known as: AUGMENTIN  Take 1 tablet by mouth 2 (two) times daily for 4 days.   aspirin  EC 81 MG tablet Take one tablet daily till 01/20/2024 and then discontinue.   atorvastatin  40 MG tablet Commonly known as: LIPITOR  Take 1  tablet (40 mg total) by mouth daily. What changed: when to take this   carvedilol  6.25 MG tablet Commonly known as: COREG  Take 0.5 tablets (3.125 mg total) by mouth 2 (two) times daily with a meal. What changed: medication strength   clopidogrel  75 MG tablet Commonly known as: PLAVIX  Take 1 tablet (75 mg total) by mouth daily.   docusate sodium  100 MG capsule Commonly known as: COLACE Take 1 capsule (100 mg total) by mouth 2 (two) times daily.   finasteride  5 MG tablet Commonly known as: PROSCAR  Take 1 tablet (5 mg total) by mouth daily. What changed: when to take this   FLUoxetine  20 MG capsule Commonly known as: PROZAC  Take 20 mg by mouth in the morning.   folic acid  1 MG tablet Commonly known as: FOLVITE  Take 1 tablet (1 mg total) by mouth daily. What changed: when to take this   pantoprazole  40 MG tablet Commonly known as: PROTONIX  Take 1 tablet (40 mg total) by mouth daily. What changed: when to take this   polyethylene glycol 17 g packet Commonly known as: MIRALAX  / GLYCOLAX  Take 17 g by mouth daily as needed.   QUEtiapine  50 MG tablet Commonly known as: SEROQUEL  Take 1 tablet (50 mg total) by mouth at bedtime.   senna-docusate 8.6-50 MG tablet Commonly known as: Senokot-S Take 1 tablet by mouth at bedtime as needed for mild constipation.   tamsulosin  0.4 MG Caps capsule Commonly known as: FLOMAX  Take 1 capsule (0.4 mg total) by mouth daily. What changed: when to take this        Discharge Exam: Filed Weights   01/17/24 0459 01/17/24 1544  Weight: 60.3 kg 61.9 kg   S: Alert and oriented to himself, NAD, pleasant.  No acute issues overnight, cleared for discharge to ALF.  No fever chills, cough or any other issues  BP (!) 154/62 (BP Location: Right Arm)   Pulse 72   Temp 98 F (36.7 C) (Oral)   Resp 15   Ht 5' 5 (1.651 m)   Wt 61.9 kg   SpO2 91%   BMI 22.71 kg/m   Physical Exam General: Alert and oriented x self, pleasant,  NAD Cardiovascular: S1 S2 clear, RRR.  Respiratory: CTAB, no wheezing Gastrointestinal: Soft, nontender, nondistended, NBS Ext: no pedal edema bilaterally Neuro: no new deficits Psych: Has dementia   Condition at discharge: fair  The results of significant diagnostics from this hospitalization (including imaging, microbiology, ancillary and laboratory) are listed below for reference.   Imaging Studies: DG Chest Port 1 View Result Date: 01/17/2024 CLINICAL DATA:  Sepsis, fever EXAM: PORTABLE CHEST 1 VIEW COMPARISON:  12/28/2023, 09/16/2023 FINDINGS: The lungs are symmetrically well expanded. Stable nodular density at the right apex. No confluent pulmonary infiltrate. No pneumothorax or pleural effusion. Cardiac size is at the upper limits of normal. Pulmonary vascularity is normal. Osseous structures are age-appropriate. No acute bone abnormality. IMPRESSION: 1. No active disease. 2. Stable nodular density at the right apex. This could be better assessed with dedicated nonemergent CT  imaging. Electronically Signed   By: Dorethia Molt M.D.   On: 01/17/2024 00:46   VAS US  CAROTID (at [facility] and [facility] only) Result Date: 12/31/2023 Carotid Arterial Duplex Study Patient Name:  Dennis [name]  Date of Exam:   12/29/2023 Medical Rec #: [id number]      Accession #:    [id number] Date of Birth: Oct 03, 1945      Patient Gender: M Patient Age:   5 years Exam Location:  [facility] Procedure:      VAS US  CAROTID Referring Phys: EARLE DE LA TORRE --------------------------------------------------------------------------------  Indications:       Speech disturbance and Difficulty ambulating. Risk Factors:      Hypertension, hyperlipidemia, prior CVA. Other Factors:     Dementia. Limitations        Today's exam was limited due to Patient confusion, tortuosity                    of vessels. Comparison Study:  No prior study Performing Technologist: Alberta Lis RVS  Examination Guidelines: A complete evaluation  includes B-mode imaging, spectral Doppler, color Doppler, and power Doppler as needed of all accessible portions of each vessel. Bilateral testing is considered an integral part of a complete examination. Limited examinations for reoccurring indications may be performed as noted.  Right Carotid Findings: +----------+--------+--------+--------+------------------+---------+           PSV cm/sEDV cm/sStenosisPlaque DescriptionComments  +----------+--------+--------+--------+------------------+---------+ CCA Prox  48      3               heterogenous                +----------+--------+--------+--------+------------------+---------+ CCA Distal54      8               heterogenous                +----------+--------+--------+--------+------------------+---------+ ICA Prox  91      9       1-39%   calcific          Shadowing +----------+--------+--------+--------+------------------+---------+ ICA Mid   109     14                                tortuous  +----------+--------+--------+--------+------------------+---------+ ICA Distal67      9                                 tortuous  +----------+--------+--------+--------+------------------+---------+ ECA       117     2                                 tortuous  +----------+--------+--------+--------+------------------+---------+ +----------+--------+-------+--------+-------------------+           PSV cm/sEDV cmsDescribeArm Pressure (mmHG) +----------+--------+-------+--------+-------------------+ [id number]                                        +----------+--------+-------+--------+-------------------+ +---------+--------+--+--------+-+ VertebralPSV cm/s38EDV cm/s2 +---------+--------+--+--------+-+  Left Carotid Findings: +----------+--------+--------+--------+----------------------+--------+           PSV cm/sEDV cm/sStenosisPlaque Description    Comments  +----------+--------+--------+--------+----------------------+--------+ CCA Prox  70      3  heterogenous                   +----------+--------+--------+--------+----------------------+--------+ CCA Distal82      12              heterogenous                   +----------+--------+--------+--------+----------------------+--------+ ICA Prox  118     15      1-39%   heterogenous                   +----------+--------+--------+--------+----------------------+--------+ ICA Mid   116     14                                    tortuous +----------+--------+--------+--------+----------------------+--------+ ICA Distal111     17                                    tortuous +----------+--------+--------+--------+----------------------+--------+ ECA       148     19              irregular and calcific         +----------+--------+--------+--------+----------------------+--------+ +----------+--------+--------+--------+-------------------+           PSV cm/sEDV cm/sDescribeArm Pressure (mmHG) +----------+--------+--------+--------+-------------------+ [id number]                                         +----------+--------+--------+--------+-------------------+ +---------+--------+--+--------+-+ VertebralPSV cm/s75EDV cm/s9 +---------+--------+--+--------+-+   Summary: Right Carotid: Velocities in the right ICA are consistent with a 1-39% stenosis. Left Carotid: Velocities in the left ICA are consistent with a 1-39% stenosis. Vertebrals:  Bilateral vertebral arteries demonstrate antegrade flow. Subclavians: Normal flow hemodynamics were seen in bilateral subclavian              arteries. *See table(s) above for measurements and observations.  Electronically signed by Eather Popp MD on 12/31/2023 at 7:20:51 AM.    Final    ECHOCARDIOGRAM COMPLETE Result Date: 12/30/2023    ECHOCARDIOGRAM REPORT   Patient Name:   Dennis [name] Date of Exam: 12/30/2023  Medical Rec #:  [id number]     Height:       65.0 in Accession #:    [id number]    Weight:       132.9 lb Date of Birth:  Mar 26, 1946     BSA:          1.663 m Patient Age:    78 years      BP:           162/89 mmHg Patient Gender: M             HR:           63 bpm. Exam Location:  Inpatient Procedure: 2D Echo, Cardiac Doppler, Color Doppler and Strain Analysis (Both            Spectral and Color Flow Doppler were utilized during procedure). Indications:    TIA  History:        Patient has prior history of Echocardiogram examinations, most                 recent 09/18/2023. Stroke and COPD, Aortic Valve Disease,  Signs/Symptoms:Chest Pain, Syncope, Altered Mental Status and                 Alzheimer's; Risk Factors:Hypertension.  Sonographer:    Ellouise Mose RDCS Referring Phys: [id number] CORTNEY E DE LA TORRE IMPRESSIONS  1. Left ventricular ejection fraction, by estimation, is 45 to 50%. The left ventricle has mildly decreased function. The left ventricle has no regional wall motion abnormalities. There is mild concentric left ventricular hypertrophy. Left ventricular diastolic parameters are consistent with Grade II diastolic dysfunction (pseudonormalization).  2. Right ventricular systolic function is normal. The right ventricular size is normal. Tricuspid regurgitation signal is inadequate for assessing PA pressure.  3. Left atrial size was mildly dilated.  4. The mitral valve is degenerative. Trivial mitral valve regurgitation. No evidence of mitral stenosis.  5. The aortic valve is tricuspid. There is moderate calcification of the aortic valve. Aortic valve regurgitation is mild to moderate. Aortic valve sclerosis/calcification is present, without any evidence of aortic stenosis.  6. The inferior vena cava is normal in size with greater than 50% respiratory variability, suggesting right atrial pressure of 3 mmHg. Comparison(s): No significant change from prior study. FINDINGS  Left Ventricle: Left  ventricular ejection fraction, by estimation, is 45 to 50%. The left ventricle has mildly decreased function. The left ventricle has no regional wall motion abnormalities. Global longitudinal strain performed but not reported based on interpreter judgement due to suboptimal tracking. The left ventricular internal cavity size was normal in size. There is mild concentric left ventricular hypertrophy. Left ventricular diastolic parameters are consistent with Grade II diastolic dysfunction (pseudonormalization). Right Ventricle: The right ventricular size is normal. No increase in right ventricular wall thickness. Right ventricular systolic function is normal. Tricuspid regurgitation signal is inadequate for assessing PA pressure. Left Atrium: Left atrial size was mildly dilated. Right Atrium: Right atrial size was normal in size. Pericardium: There is no evidence of pericardial effusion. Mitral Valve: The mitral valve is degenerative in appearance. Mild mitral annular calcification. Trivial mitral valve regurgitation. No evidence of mitral valve stenosis. Tricuspid Valve: The tricuspid valve is normal in structure. Tricuspid valve regurgitation is trivial. No evidence of tricuspid stenosis. Aortic Valve: The aortic valve is tricuspid. There is moderate calcification of the aortic valve. Aortic valve regurgitation is mild to moderate. Aortic valve sclerosis/calcification is present, without any evidence of aortic stenosis. Aortic valve mean gradient measures 6.0 mmHg. Aortic valve peak gradient measures 11.8 mmHg. Aortic valve area, by VTI measures 2.92 cm. Pulmonic Valve: The pulmonic valve was grossly normal. Pulmonic valve regurgitation is trivial. No evidence of pulmonic stenosis. Aorta: The aortic root and ascending aorta are structurally normal, with no evidence of dilitation. Venous: The inferior vena cava is normal in size with greater than 50% respiratory variability, suggesting right atrial pressure of 3  mmHg. IAS/Shunts: The atrial septum is grossly normal.  LEFT VENTRICLE PLAX 2D LVIDd:         5.22 cm      Diastology LVIDs:         3.87 cm      LV e' medial:    3.26 cm/s LV PW:         1.21 cm      LV E/e' medial:  19.8 LV IVS:        1.29 cm      LV e' lateral:   5.11 cm/s LVOT diam:     2.34 cm      LV E/e' lateral: 12.6 LV SV:  104 LV SV Index:   63           2D Longitudinal Strain LVOT Area:     4.30 cm     2D Strain GLS Avg:     -17.1 %  LV Volumes (MOD) LV vol d, MOD A2C: 136.0 ml LV vol d, MOD A4C: 103.0 ml LV vol s, MOD A2C: 64.2 ml LV vol s, MOD A4C: 53.8 ml LV SV MOD A2C:     71.8 ml LV SV MOD A4C:     103.0 ml LV SV MOD BP:      58.5 ml RIGHT VENTRICLE             IVC RV S prime:     20.30 cm/s  IVC diam: 1.23 cm TAPSE (M-mode): 2.0 cm LEFT ATRIUM           Index        RIGHT ATRIUM          Index LA Vol (A2C): 58.5 ml 35.18 ml/m  RA Area:     9.36 cm LA Vol (A4C): 49.8 ml 29.95 ml/m  RA Volume:   17.30 ml 10.40 ml/m  AORTIC VALVE AV Area (Vmax):    2.75 cm AV Area (Vmean):   2.76 cm AV Area (VTI):     2.92 cm AV Vmax:           172.00 cm/s AV Vmean:          109.500 cm/s AV VTI:            0.357 m AV Peak Grad:      11.8 mmHg AV Mean Grad:      6.0 mmHg LVOT Vmax:         110.00 cm/s LVOT Vmean:        70.400 cm/s LVOT VTI:          0.242 m LVOT/AV VTI ratio: 0.68  AORTA Ao Root diam: 3.92 cm Ao Asc diam:  3.99 cm MITRAL VALVE MV Area (PHT): 2.16 cm    SHUNTS MV Decel Time: 352 msec    Systemic VTI:  0.24 m MV E velocity: 64.50 cm/s  Systemic Diam: 2.34 cm MV A velocity: 49.30 cm/s MV E/A ratio:  1.31 Darryle Decent MD Electronically signed by Darryle Decent MD Signature Date/Time: 12/30/2023/2:25:04 PM    Final    MR ANGIO HEAD WO CONTRAST Result Date: 12/30/2023 CLINICAL DATA:  Initial evaluation for stroke/TIA. EXAM: MRA HEAD WITHOUT CONTRAST TECHNIQUE: Angiographic images of the Circle of Willis were acquired using MRA technique without intravenous contrast. COMPARISON:  Comparison  made with prior brain MRI from 12/28/2023 FINDINGS: Anterior circulation: Examination moderately degraded by motion artifact. Both internal carotid arteries are patent through the siphons without stenosis or other abnormality. A1 segments patent bilaterally. Normal anterior communicating artery complex. As a case ACA widely patent without stenosis. No M1 stenosis or occlusion. No proximal MCA branch occlusion. Distal MCA branches perfused and fairly symmetric. Posterior circulation: Both V4 segments patent without stenosis for the vertebral artery dominant. Both PICA patent at their origins. Basilar patent without stenosis. Superior cerebellar and posterior cerebral arteries patent bilaterally. Anatomic variants: None significant.  No intracranial aneurysm. Other: None. IMPRESSION: Negative intracranial MRA. No large vessel occlusion or hemodynamically significant stenosis. Electronically Signed   By: Morene Hoard M.D.   On: 12/30/2023 01:40   EEG adult Result Date: 12/29/2023 Shelton Arlin KIDD, MD     12/29/2023 11:48 AM Patient Name: Alm  MALAKI KOURY MRN: [id number] Epilepsy Attending: Arlin MALVA Krebs Referring Physician/Provider: Patt Alm Macho, MD Date: 12/29/2023 Duration: 23.20 mins Patient history: 78yo M with ams. EEG to evaluate for seizure Level of alertness: Awake AEDs during EEG study: None Technical aspects: This EEG study was done with scalp electrodes positioned according to the 10-20 International system of electrode placement. Electrical activity was reviewed with band pass filter of 1-70Hz , sensitivity of 7 uV/mm, display speed of 38mm/sec with a 60Hz  notched filter applied as appropriate. EEG data were recorded continuously and digitally stored.  Video monitoring was available and reviewed as appropriate. Description: The posterior dominant rhythm consists of 7 Hz activity of moderate voltage (25-35 uV) seen predominantly in posterior head regions, symmetric and reactive to eye opening  and eye closing. EEG showed continuous generalized 3 to 6 Hz theta-delta slowing.Hyperventilation and photic stimulation were not performed.   ABNORMALITY - Continuous slow, generalized IMPRESSION: This study is suggestive of mild to moderate diffuse encephalopathy. No seizures or epileptiform discharges were seen throughout the recording. Arlin MALVA Krebs   CT VENOGRAM HEAD Result Date: 12/28/2023 CLINICAL DATA:  Initial evaluation for dural venous sinus thrombosis. EXAM: CT VENOGRAM HEAD TECHNIQUE: Venographic phase images of the brain were obtained following the administration of intravenous contrast. Multiplanar reformats and maximum intensity projections were generated. RADIATION DOSE REDUCTION: This exam was performed according to the departmental dose-optimization program which includes automated exposure control, adjustment of the mA and/or kV according to patient size and/or use of iterative reconstruction technique. CONTRAST:  75mL OMNIPAQUE  IOHEXOL  350 MG/ML SOLN COMPARISON:  CTs from earlier the same day. FINDINGS: Normal enhancement seen throughout the superior sagittal sinus to the torcula. Transverse and sigmoid sinuses are patent as are the jugular bulbs and visualized internal jugular veins. Well-circumscribed ovoid filling defect at the junction of the right transverse and sigmoid sinuses noted, felt to be consistent with an arachnoid granulation. Straight sinus, vein of Galen, and internal cerebral veins are patent. No evidence for dural venous sinus thrombosis. No appreciable cortical vein abnormality. IMPRESSION: Negative CT venogram. No evidence for dural venous sinus thrombosis. Electronically Signed   By: Morene Hoard M.D.   On: 12/28/2023 21:35   MR BRAIN WO CONTRAST Result Date: 12/28/2023 CLINICAL DATA:  Initial evaluation for acute neuro deficit, stroke suspected. EXAM: MRI HEAD WITHOUT CONTRAST TECHNIQUE: Multiplanar, multiecho pulse sequences of the brain and surrounding  structures were obtained without intravenous contrast. COMPARISON:  CTs from earlier the same day. FINDINGS: Brain: Diffuse prominence of the CSF containing spaces compatible generalized cerebral atrophy. Patchy and confluent T2/FLAIR hyperintensity involving the periventricular deep white matter both cerebral hemispheres, consistent with chronic small vessel ischemic disease, moderately advanced in nature. No abnormal foci of restricted diffusion to suggest acute or subacute ischemia. Gray-white matter differentiation maintained. No acute or significant chronic intracranial blood products. No mass lesion, midline shift or mass effect. Mild ventricular prominence related to global parenchymal volume loss without hydrocephalus. No extra-axial fluid collection. Pituitary gland and suprasellar region within normal limits. Vascular: Major intracranial arterial vascular flow voids are maintained. Asymmetric FLAIR hyperintensity seen involving the right transverse and sigmoid sinuses extending into the proximal right internal jugular vein, favored to reflect slow/sluggish flow. Skull and upper cervical spine: Craniocervical junction within normal limits. Bone marrow signal intensity overall within normal limits. No scalp soft tissue abnormality. Sinuses/Orbits: Globes orbital soft tissues within normal limits. Right maxillary sinus retention cyst. Paranasal sinuses are otherwise largely clear. No significant mastoid effusion. Other: None. IMPRESSION:  1. No acute intracranial abnormality. 2. Age-related cerebral atrophy with moderately advanced chronic microvascular ischemic disease. 3. Asymmetric FLAIR hyperintensity involving the right transverse and sigmoid sinuses extending into the proximal right internal jugular vein, favored to reflect slow/sluggish flow. If there is clinical concern for possible dural sinus thrombosis, further evaluation with dedicated MRV could be performed for further evaluation. Electronically  Signed   By: Morene Hoard M.D.   On: 12/28/2023 19:19   DG Chest 1 View Result Date: 12/28/2023 CLINICAL DATA:  [id number] Altered mental state [id number] EXAM: CHEST  1 VIEW COMPARISON:  Oct 08, 2023 FINDINGS: No focal airspace consolidation, pleural effusion, or pneumothorax. Mild cardiomegaly. Aortic atherosclerosis. No acute fracture or destructive lesions. Multilevel thoracic osteophytosis. IMPRESSION: No acute cardiopulmonary abnormality. Electronically Signed   By: Rogelia Myers M.D.   On: 12/28/2023 18:59   CT ANGIO HEAD NECK W WO CM (CODE STROKE) Result Date: 12/28/2023 EXAM: CTA HEAD AND NECK WITH AND WITHOUT 12/28/2023 02:11:32 PM TECHNIQUE: CTA of the head and neck was performed with and without the administration of intravenous contrast. Multiplanar 2D and/or 3D reformatted images are provided for review. Automated exposure control, iterative reconstruction, and/or weight based adjustment of the mA/kV was utilized to reduce the radiation dose to as low as reasonably achievable. Stenosis of the internal carotid arteries measured using NASCET criteria. COMPARISON: CT and CTA head and neck dated 12/28/2023. CLINICAL HISTORY: Neuro deficit, acute, stroke suspected. FINDINGS: CTA NECK: AORTIC ARCH AND ARCH VESSELS: Moderate atherosclerosis of the visualized aortic arch. Atherosclerosis of the aortic arch vessel origins without high-grade stenosis. The subclavian arteries are patent bilaterally. CERVICAL CAROTID ARTERIES: Mild atherosclerosis of the right common carotid artery. Bulky calcified atherosclerosis at the carotid bifurcation and along the proximal cervical ICA where there is approximately 55% stenosis. There is tortuosity of the mid right cervical ICA. Mild atherosclerosis of the left common carotid artery. Atherosclerosis at the left carotid bifurcation and along the proximal left cervical ICA where there is approximately 40% stenosis. Mild tortuosity of the mid cervical ICA. CERVICAL  VERTEBRAL ARTERIES: Atherosclerosis at the right vertebral artery origin resulting in moderate stenosis. The vertebral arteries are patent from the origins to the vertebrobasilar confluence. Atherosclerosis of the left V4 segment resulting in mild stenosis. LUNGS AND MEDIASTINUM: Unremarkable. SOFT TISSUES: No acute abnormality. BONES: Degenerative changes in the visualized spine with disc space narrowing most pronounced at C4-5 through C6-7. CTA HEAD: ANTERIOR CIRCULATION: The intracranial internal carotid arteries are patent bilaterally. Atherosclerosis of the carotid siphons resulting in mild stenosis. The MCAs are patent bilaterally. The ACAs are patent bilaterally. POSTERIOR CIRCULATION: The basilar artery is patent. The PCAs are patent bilaterally. The superior cerebellar arteries are patent bilaterally. OTHER: No dural venous sinus thrombosis on this non-dedicated study. IMPRESSION: 1. No large vessel occlusion. 2. 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 3. Atherosclerosis with approximately 40% stenosis at the proximal left cervical ICA. 4. Moderate stenosis at the right vertebral artery origin. 5. Moderate atherosclerosis of the aortic arch. Electronically signed by: Donnice Mania MD 12/28/2023 02:28 PM EDT RP Workstation: [id number]   CT HEAD CODE STROKE WO CONTRAST Result Date: 12/28/2023 EXAM: CT HEAD WITHOUT 12/28/2023 01:59:30 PM TECHNIQUE: CT of the head was performed without the administration of intravenous contrast. Automated exposure control, iterative reconstruction, and/or weight based adjustment of the mA/kV was utilized to reduce the radiation dose to as low as reasonably achievable. COMPARISON: CT head 09/16/2023 CLINICAL HISTORY: Neuro deficit, acute, stroke suspected.  FINDINGS: BRAIN AND VENTRICLES: No acute intracranial hemorrhage. No mass effect or midline shift. No extra-axial fluid collection. Gray-white differentiation is maintained. No  hydrocephalus. Nonspecific hypoattenuation in the periventricular and subcortical white matter, most likely representing chronic small vessel disease. Similar appearance of remote cortical infarct in the posterior right frontal lobe. Generalized parenchymal volume loss. No edema, mass effect, or midline shift. No extraaxial fluid collections. ORBITS: No acute abnormality. SINUSES AND MASTOIDS: Mucosal thickening in the right sphenoid and right maxillary sinuses. SOFT TISSUES AND SKULL: No acute skull fracture. No acute soft tissue abnormality. [place] stroke program early CT (ASPECT) score Ganglionic (caudate, IC, lentiform nucleus, insula, M1-M3): 7 Supraganglionic (M4-M6): 3 Total: 10 IMPRESSION: 1. No acute intracranial abnormality. 2. 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 3. 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 4. Findings messaged to Dr. Arora via the [facility] messaging system at 2:05PM on 12/28/23. Electronically signed by: Donnice Mania MD 12/28/2023 02:06 PM EDT RP Workstation: [id number]    Microbiology: Results for orders placed or performed during the hospital encounter of 01/16/24  Blood Culture (routine x 2)     Status: None (Preliminary result)   Collection Time: 01/17/24 12:29 AM   Specimen: BLOOD  Result Value Ref Range Status   Specimen Description   Final    BLOOD RIGHT ANTECUBITAL Performed at [facility], [address] [place], [place] [address]    Special Requests   Final    BOTTLES DRAWN AEROBIC AND ANAEROBIC Blood Culture adequate volume Performed at [facility], [address], [place], [place] [address]    Culture   Final    NO GROWTH 3 DAYS Performed at [facility], [address], [place], [place] [address]    Report Status PENDING  Incomplete  Resp panel by RT-PCR (RSV, Flu A&B, Covid) Anterior Nasal Swab     Status: None   Collection Time: 01/17/24 12:51 AM    Specimen: Anterior Nasal Swab  Result Value Ref Range Status   SARS Coronavirus 2 by RT PCR NEGATIVE NEGATIVE Final    Comment: (NOTE) SARS-CoV-2 target nucleic acids are NOT DETECTED.  The SARS-CoV-2 RNA is generally detectable in upper respiratory specimens during the acute phase of infection. The lowest concentration of SARS-CoV-2 viral copies this assay can detect is 138 copies/mL. A negative result does not preclude SARS-Cov-2 infection and should not be used as the sole basis for treatment or other patient management decisions. A negative result may occur with  improper specimen collection/handling, submission of specimen other than nasopharyngeal swab, presence of viral mutation(s) within the areas targeted by this assay, and inadequate number of viral copies(<138 copies/mL). A negative result must be combined with clinical observations, patient history, and epidemiological information. The expected result is Negative.  Fact Sheet for Patients:  BloggerCourse.com  Fact Sheet for Healthcare Providers:  SeriousBroker.it  This test is no t yet approved or cleared by the [place]  FDA and  has been authorized for detection and/or diagnosis of SARS-CoV-2 by FDA under an Emergency Use Authorization (EUA). This EUA will remain  in effect (meaning this test can be used) for the duration of the COVID-19 declaration under Section 564(b)(1) of the Act, 21 U.S.C.section 360bbb-3(b)(1), unless the authorization is terminated  or revoked sooner.       Influenza A by PCR NEGATIVE NEGATIVE Final   Influenza B by PCR NEGATIVE NEGATIVE Final    Comment: (NOTE) The Xpert Xpress  SARS-CoV-2/FLU/RSV plus assay is intended as an aid in the diagnosis of influenza from Nasopharyngeal swab specimens and should not be used as a sole basis for treatment. Nasal washings and aspirates are unacceptable for Xpert Xpress  SARS-CoV-2/FLU/RSV testing.  Fact Sheet for Patients: BloggerCourse.com  Fact Sheet for Healthcare Providers: SeriousBroker.it  This test is not yet approved or cleared by the [place]  FDA and has been authorized for detection and/or diagnosis of SARS-CoV-2 by FDA under an Emergency Use Authorization (EUA). This EUA will remain in effect (meaning this test can be used) for the duration of the COVID-19 declaration under Section 564(b)(1) of the Act, 21 U.S.C. section 360bbb-3(b)(1), unless the authorization is terminated or revoked.     Resp Syncytial Virus by PCR NEGATIVE NEGATIVE Final    Comment: (NOTE) Fact Sheet for Patients: BloggerCourse.com  Fact Sheet for Healthcare Providers: SeriousBroker.it  This test is not yet approved or cleared by the [place]  FDA and has been authorized for detection and/or diagnosis of SARS-CoV-2 by FDA under an Emergency Use Authorization (EUA). This EUA will remain in effect (meaning this test can be used) for the duration of the COVID-19 declaration under Section 564(b)(1) of the Act, 21 U.S.C. section 360bbb-3(b)(1), unless the authorization is terminated or revoked.  Performed at [facility], [address] [place], [place] [address]   Respiratory (~20 pathogens) panel by PCR     Status: None   Collection Time: 01/17/24  8:22 AM   Specimen: Nasopharyngeal Swab; Respiratory  Result Value Ref Range Status   Adenovirus NOT DETECTED NOT DETECTED Final   Coronavirus 229E NOT DETECTED NOT DETECTED Final    Comment: (NOTE) The Coronavirus on the Respiratory Panel, DOES NOT test for the novel  Coronavirus (2019 nCoV)    Coronavirus HKU1 NOT DETECTED NOT DETECTED Final   Coronavirus NL63 NOT DETECTED NOT DETECTED Final   Coronavirus OC43 NOT DETECTED NOT DETECTED Final   Metapneumovirus NOT DETECTED NOT  DETECTED Final   Rhinovirus / Enterovirus NOT DETECTED NOT DETECTED Final   Influenza A NOT DETECTED NOT DETECTED Final   Influenza B NOT DETECTED NOT DETECTED Final   Parainfluenza Virus 1 NOT DETECTED NOT DETECTED Final   Parainfluenza Virus 2 NOT DETECTED NOT DETECTED Final   Parainfluenza Virus 3 NOT DETECTED NOT DETECTED Final   Parainfluenza Virus 4 NOT DETECTED NOT DETECTED Final   Respiratory Syncytial Virus NOT DETECTED NOT DETECTED Final   Bordetella pertussis NOT DETECTED NOT DETECTED Final   Bordetella Parapertussis NOT DETECTED NOT DETECTED Final   Chlamydophila pneumoniae NOT DETECTED NOT DETECTED Final   Mycoplasma pneumoniae NOT DETECTED NOT DETECTED Final    Comment: Performed at [facility], [address] [place], [place] [address]  Resp panel by RT-PCR (RSV, Flu A&B, Covid) Nasopharyngeal Swab     Status: None   Collection Time: 01/17/24  8:22 AM   Specimen: Nasopharyngeal Swab; Nasal Swab  Result Value Ref Range Status   SARS Coronavirus 2 by RT PCR NEGATIVE NEGATIVE Final    Comment: (NOTE) SARS-CoV-2 target nucleic acids are NOT DETECTED.  The SARS-CoV-2 RNA is generally detectable in upper respiratory specimens during the acute phase of infection. The lowest concentration of SARS-CoV-2 viral copies this assay can detect is 138 copies/mL. A negative result does not preclude SARS-Cov-2 infection and should not be used as the sole basis for treatment or other patient management decisions. A negative result may occur with  improper specimen collection/handling, submission of  specimen other than nasopharyngeal swab, presence of viral mutation(s) within the areas targeted by this assay, and inadequate number of viral copies(<138 copies/mL). A negative result must be combined with clinical observations, patient history, and epidemiological information. The expected result is Negative.  Fact Sheet for Patients:   BloggerCourse.com  Fact Sheet for Healthcare Providers:  SeriousBroker.it  This test is no t yet approved or cleared by the [place]  FDA and  has been authorized for detection and/or diagnosis of SARS-CoV-2 by FDA under an Emergency Use Authorization (EUA). This EUA will remain  in effect (meaning this test can be used) for the duration of the COVID-19 declaration under Section 564(b)(1) of the Act, 21 U.S.C.section 360bbb-3(b)(1), unless the authorization is terminated  or revoked sooner.       Influenza A by PCR NEGATIVE NEGATIVE Final   Influenza B by PCR NEGATIVE NEGATIVE Final    Comment: (NOTE) The Xpert Xpress SARS-CoV-2/FLU/RSV plus assay is intended as an aid in the diagnosis of influenza from Nasopharyngeal swab specimens and should not be used as a sole basis for treatment. Nasal washings and aspirates are unacceptable for Xpert Xpress SARS-CoV-2/FLU/RSV testing.  Fact Sheet for Patients: BloggerCourse.com  Fact Sheet for Healthcare Providers: SeriousBroker.it  This test is not yet approved or cleared by the [place]  FDA and has been authorized for detection and/or diagnosis of SARS-CoV-2 by FDA under an Emergency Use Authorization (EUA). This EUA will remain in effect (meaning this test can be used) for the duration of the COVID-19 declaration under Section 564(b)(1) of the Act, 21 U.S.C. section 360bbb-3(b)(1), unless the authorization is terminated or revoked.     Resp Syncytial Virus by PCR NEGATIVE NEGATIVE Final    Comment: (NOTE) Fact Sheet for Patients: BloggerCourse.com  Fact Sheet for Healthcare Providers: SeriousBroker.it  This test is not yet approved or cleared by the [place]  FDA and has been authorized for detection and/or diagnosis of SARS-CoV-2 by FDA under an Emergency Use  Authorization (EUA). This EUA will remain in effect (meaning this test can be used) for the duration of the COVID-19 declaration under Section 564(b)(1) of the Act, 21 U.S.C. section 360bbb-3(b)(1), unless the authorization is terminated or revoked.  Performed at [facility], [address], [place], [place] [address]   MRSA Next Gen by PCR, Nasal     Status: None   Collection Time: 01/17/24  8:22 AM   Specimen: Nasopharyngeal Swab; Nasal Swab  Result Value Ref Range Status   MRSA by PCR Next Gen NOT DETECTED NOT DETECTED Final    Comment: (NOTE) The GeneXpert MRSA Assay (FDA approved for NASAL specimens only), is one component of a comprehensive MRSA colonization surveillance program. It is not intended to diagnose MRSA infection nor to guide or monitor treatment for MRSA infections. Test performance is not FDA approved in patients less than 43 years old. Performed at [facility], [address], [place], [place] [address]   Blood Culture (routine x 2)     Status: None (Preliminary result)   Collection Time: 01/17/24  4:09 PM   Specimen: BLOOD RIGHT ARM  Result Value Ref Range Status   Specimen Description   Final    BLOOD RIGHT ARM Performed at [facility], [address], [place], [place] [address]    Special Requests   Final    BOTTLES DRAWN AEROBIC AND ANAEROBIC Blood Culture adequate volume Performed at [facility], [address] [place], [place]  [address]    Culture   Final    NO GROWTH 3 DAYS Performed at [facility], [address], [place], [place] [address]    Report Status PENDING  Incomplete    Labs: CBC: Recent Labs  Lab 01/17/24 0052 01/17/24 1629 01/18/24 0730 01/19/24 0418 01/20/24 0353  WBC 11.6* 10.4 10.2 9.5 10.5  NEUTROABS 8.4*  --   --   --   --   HGB 11.4* 10.8* 11.6* 11.5* 11.9*  HCT 36.7* 36.5* 37.9* 36.7* 38.4*  MCV 87.6 91.5 88.6 88.9 89.3  PLT 238 223 233 261  277   Basic Metabolic Panel: Recent Labs  Lab 01/17/24 0052 01/17/24 1629 01/18/24 0730 01/19/24 0418 01/20/24 0353  NA 139  --  141 141 141  K 4.0  --  4.1 4.1 4.5  CL 106  --  107 107 106  CO2 24  --  24 24 26   GLUCOSE 103*  --  90 108* 91  BUN 20  --  16 21 19   CREATININE 1.04 1.04 0.89 1.23 0.94  CALCIUM  9.5  --  9.9 9.2 9.7  PHOS  --   --  2.4* 2.5 2.7   Liver Function Tests: Recent Labs  Lab 01/17/24 0052 01/18/24 0730 01/19/24 0418 01/20/24 0353  AST 20  --   --   --   ALT 13  --   --   --   ALKPHOS 37*  --   --   --   BILITOT 0.3  --   --   --   PROT 6.5  --   --   --   ALBUMIN 3.5 3.2* 3.1* 3.3*   CBG: No results for input(s): GLUCAP in the last 168 hours.  Discharge time spent: greater than 30 minutes.  Signed: Nydia Distance, MD [facility] 01/20/2024

## 2024-01-20 NOTE — NC FL2 (Signed)
 [place]  MEDICAID FL2 LEVEL OF CARE FORM     IDENTIFICATION  Patient Name: Dennis [name] Birthdate: [birth year] Sex: male Admission Date (Current Location): 01/16/2024  [facility] and [place] Number:  Producer, television/film/video and Address:  [facility],  [address] [place], [place] [address]      Provider Number: [id number]  Attending Physician Name and Address:  Davia Nydia POUR, MD  Relative Name and Phone Number:  joesph iha (Legal Guardian)  [phone] (Mobile    Current Level of Care: Hospital Recommended Level of Care: Memory Care Prior Approval Number:    Date Approved/Denied:   PASRR Number: [id number]  Discharge Plan: Other (Comment) (Memory care)    Current Diagnoses: Patient Active Problem List   Diagnosis Date Noted   Sepsis (HCC) 01/17/2024   Pneumonia 01/17/2024   Expressive aphasia 12/29/2023   Acute encephalopathy 12/29/2023   History of essential hypertension 12/29/2023   Anemia of chronic disease 12/29/2023   Dysarthria 12/28/2023   Syncope 09/16/2023   AKI (acute kidney injury) (HCC) 09/16/2023   UTI (urinary tract infection) 09/16/2023   Acute metabolic encephalopathy 09/16/2023   History of stroke 09/16/2023   Agitation 07/10/2023   Dysphagia 07/02/2023   Dementia with behavioral disturbance (HCC) 07/02/2023   Acute CVA (cerebrovascular accident) (HCC) 06/21/2023   BPH (benign prostatic hyperplasia) 06/21/2023   Altered mental state 02/22/2023   Difficulty walking 02/22/2023   HLD (hyperlipidemia) 06/02/2009   HTN (hypertension) 06/02/2009   AORTIC INSUFFICIENCY, MILD 06/02/2009   DYSPNEA ON EXERTION 06/02/2009   CHEST PAIN, ATYPICAL 06/02/2009    Orientation RESPIRATION BLADDER Height & Weight     Self  Normal Incontinent Weight: 136 lb 7.4 oz (61.9 kg) Height:  5' 5 (165.1 cm)  BEHAVIORAL SYMPTOMS/MOOD NEUROLOGICAL BOWEL NUTRITION STATUS      Continent Diet (regular)  AMBULATORY STATUS COMMUNICATION OF NEEDS Skin    Independent Verbally Normal                       Personal Care Assistance Level of Assistance  Bathing, Feeding, Dressing Bathing Assistance: Limited assistance Feeding assistance: Independent Dressing Assistance: Limited assistance     Functional Limitations Info  Sight, Hearing, Speech Sight Info: Impaired (glasses) Hearing Info: Adequate Speech Info: Adequate    SPECIAL CARE FACTORS FREQUENCY                       Contractures Contractures Info: Not present    Additional Factors Info  Code Status, Allergies Code Status Info: full Allergies Info: Codeine,    Shellfish Allergy           Current Medications (01/20/2024):  This is the current hospital active medication list Current Facility-Administered Medications  Medication Dose Route Frequency Provider Last Rate Last Admin   amoxicillin -clavulanate (AUGMENTIN ) 875-125 MG per tablet 1 tablet  1 tablet Oral Q12H Rai, Ripudeep K, MD       aspirin  EC tablet 81 mg  81 mg Oral Daily Rai, Ripudeep K, MD   81 mg at 01/19/24 1114   atorvastatin  (LIPITOR ) tablet 40 mg  40 mg Oral Daily Rai, Ripudeep K, MD   40 mg at 01/19/24 1116   carvedilol  (COREG ) tablet 6.25 mg  6.25 mg Oral BID WC Rai, Ripudeep K, MD   6.25 mg at 01/19/24 1755   clopidogrel  (PLAVIX ) tablet 75 mg  75 mg Oral Daily Rai, Ripudeep K, MD   75 mg at 01/19/24 1115  docusate sodium  (COLACE) capsule 100 mg  100 mg Oral BID Rai, Ripudeep K, MD   100 mg at 01/19/24 2013   enoxaparin  (LOVENOX ) injection 40 mg  40 mg Subcutaneous Daily Rai, Ripudeep K, MD   40 mg at 01/19/24 1117   finasteride  (PROSCAR ) tablet 5 mg  5 mg Oral Daily Rai, Ripudeep K, MD   5 mg at 01/19/24 1114   FLUoxetine  (PROZAC ) capsule 20 mg  20 mg Oral Daily Rai, Ripudeep K, MD   20 mg at 01/19/24 1116   folic acid  (FOLVITE ) tablet 1 mg  1 mg Oral Daily Rai, Ripudeep K, MD   1 mg at 01/19/24 1114   pantoprazole  (PROTONIX ) EC tablet 40 mg  40 mg Oral Daily Rai, Ripudeep K, MD   40 mg at  01/19/24 1116   polyethylene glycol (MIRALAX  / GLYCOLAX ) packet 17 g  17 g Oral Daily PRN Rai, Ripudeep K, MD       QUEtiapine  (SEROQUEL ) tablet 50 mg  50 mg Oral QHS Rai, Ripudeep K, MD   50 mg at 01/19/24 2013   senna-docusate (Senokot-S) tablet 1 tablet  1 tablet Oral QHS Rai, Ripudeep K, MD   1 tablet at 01/19/24 2013   tamsulosin  (FLOMAX ) capsule 0.4 mg  0.4 mg Oral Daily Rai, Ripudeep K, MD   0.4 mg at 01/19/24 1114     Discharge Medications: Medication List       TAKE these medications     amoxicillin -clavulanate 875-125 MG tablet Commonly known as: AUGMENTIN  Take 1 tablet by mouth 2 (two) times daily for 4 days.    aspirin  EC 81 MG tablet Take one tablet daily till 01/20/2024 and then discontinue.    atorvastatin  40 MG tablet Commonly known as: LIPITOR  Take 1 tablet (40 mg total) by mouth daily. What changed: when to take this    carvedilol  6.25 MG tablet Commonly known as: COREG  Take 0.5 tablets (3.125 mg total) by mouth 2 (two) times daily with a meal. What changed: medication strength    clopidogrel  75 MG tablet Commonly known as: PLAVIX  Take 1 tablet (75 mg total) by mouth daily.    docusate sodium  100 MG capsule Commonly known as: COLACE Take 1 capsule (100 mg total) by mouth 2 (two) times daily.    finasteride  5 MG tablet Commonly known as: PROSCAR  Take 1 tablet (5 mg total) by mouth daily. What changed: when to take this    FLUoxetine  20 MG capsule Commonly known as: PROZAC  Take 20 mg by mouth in the morning.    folic acid  1 MG tablet Commonly known as: FOLVITE  Take 1 tablet (1 mg total) by mouth daily. What changed: when to take this    pantoprazole  40 MG tablet Commonly known as: PROTONIX  Take 1 tablet (40 mg total) by mouth daily. What changed: when to take this    polyethylene glycol 17 g packet Commonly known as: MIRALAX  / GLYCOLAX  Take 17 g by mouth daily as needed.    QUEtiapine  50 MG tablet Commonly known as: SEROQUEL  Take 1 tablet  (50 mg total) by mouth at bedtime.    senna-docusate 8.6-50 MG tablet Commonly known as: Senokot-S Take 1 tablet by mouth at bedtime as needed for mild constipation.    tamsulosin  0.4 MG Caps capsule Commonly known as: FLOMAX  Take 1 capsule (0.4 mg total) by mouth daily. What changed: when to take this    Relevant Imaging Results:  Relevant Lab Results:   Additional Information [id number]  Tawni  M Nyzier Boivin, LCSW

## 2024-01-22 LAB — CULTURE, BLOOD (ROUTINE X 2)
Culture: NO GROWTH
Culture: NO GROWTH
Special Requests: ADEQUATE
Special Requests: ADEQUATE

## 2024-01-28 ENCOUNTER — Emergency Department (HOSPITAL_COMMUNITY)

## 2024-01-28 ENCOUNTER — Encounter (HOSPITAL_COMMUNITY): Payer: Self-pay | Admitting: Emergency Medicine

## 2024-01-28 ENCOUNTER — Inpatient Hospital Stay (HOSPITAL_COMMUNITY)
Admission: EM | Admit: 2024-01-28 | Discharge: 2024-02-02 | DRG: 195 | Disposition: A | Discharge status: Skilled Nursing Facility | Source: Skilled Nursing Facility | Attending: Internal Medicine | Admitting: Internal Medicine

## 2024-01-28 ENCOUNTER — Other Ambulatory Visit: Payer: Self-pay

## 2024-01-28 DIAGNOSIS — N400 Enlarged prostate without lower urinary tract symptoms: Secondary | ICD-10-CM | POA: Diagnosis present

## 2024-01-28 DIAGNOSIS — I10 Essential (primary) hypertension: Secondary | ICD-10-CM | POA: Diagnosis present

## 2024-01-28 DIAGNOSIS — R32 Unspecified urinary incontinence: Secondary | ICD-10-CM | POA: Diagnosis present

## 2024-01-28 DIAGNOSIS — D649 Anemia, unspecified: Secondary | ICD-10-CM | POA: Diagnosis present

## 2024-01-28 DIAGNOSIS — Z9049 Acquired absence of other specified parts of digestive tract: Secondary | ICD-10-CM | POA: Diagnosis not present

## 2024-01-28 DIAGNOSIS — Z1152 Encounter for screening for COVID-19: Secondary | ICD-10-CM

## 2024-01-28 DIAGNOSIS — Z79899 Other long term (current) drug therapy: Secondary | ICD-10-CM | POA: Diagnosis not present

## 2024-01-28 DIAGNOSIS — J189 Pneumonia, unspecified organism: Principal | ICD-10-CM

## 2024-01-28 DIAGNOSIS — E785 Hyperlipidemia, unspecified: Secondary | ICD-10-CM | POA: Diagnosis present

## 2024-01-28 DIAGNOSIS — J180 Bronchopneumonia, unspecified organism: Principal | ICD-10-CM | POA: Diagnosis present

## 2024-01-28 DIAGNOSIS — Z515 Encounter for palliative care: Secondary | ICD-10-CM | POA: Diagnosis not present

## 2024-01-28 DIAGNOSIS — Z66 Do not resuscitate: Secondary | ICD-10-CM | POA: Diagnosis not present

## 2024-01-28 DIAGNOSIS — Z885 Allergy status to narcotic agent status: Secondary | ICD-10-CM

## 2024-01-28 DIAGNOSIS — F0390 Unspecified dementia without behavioral disturbance: Secondary | ICD-10-CM | POA: Diagnosis present

## 2024-01-28 DIAGNOSIS — R911 Solitary pulmonary nodule: Secondary | ICD-10-CM | POA: Diagnosis present

## 2024-01-28 DIAGNOSIS — Z91013 Allergy to seafood: Secondary | ICD-10-CM | POA: Diagnosis not present

## 2024-01-28 DIAGNOSIS — I69320 Aphasia following cerebral infarction: Secondary | ICD-10-CM

## 2024-01-28 DIAGNOSIS — Z8673 Personal history of transient ischemic attack (TIA), and cerebral infarction without residual deficits: Secondary | ICD-10-CM

## 2024-01-28 DIAGNOSIS — Z7189 Other specified counseling: Secondary | ICD-10-CM

## 2024-01-28 DIAGNOSIS — R5381 Other malaise: Secondary | ICD-10-CM | POA: Diagnosis present

## 2024-01-28 DIAGNOSIS — Z7982 Long term (current) use of aspirin: Secondary | ICD-10-CM | POA: Diagnosis not present

## 2024-01-28 DIAGNOSIS — Z7902 Long term (current) use of antithrombotics/antiplatelets: Secondary | ICD-10-CM | POA: Diagnosis not present

## 2024-01-28 DIAGNOSIS — R159 Full incontinence of feces: Secondary | ICD-10-CM | POA: Diagnosis present

## 2024-01-28 DIAGNOSIS — Z23 Encounter for immunization: Secondary | ICD-10-CM

## 2024-01-28 DIAGNOSIS — Z87891 Personal history of nicotine dependence: Secondary | ICD-10-CM | POA: Diagnosis not present

## 2024-01-28 DIAGNOSIS — R531 Weakness: Secondary | ICD-10-CM | POA: Diagnosis not present

## 2024-01-28 LAB — RESP PANEL BY RT-PCR (RSV, FLU A&B, COVID)  RVPGX2
Influenza A by PCR: NEGATIVE
Influenza B by PCR: NEGATIVE
Resp Syncytial Virus by PCR: NEGATIVE
SARS Coronavirus 2 by RT PCR: NEGATIVE

## 2024-01-28 LAB — CBC
HCT: 37.1 % — ABNORMAL LOW (ref 39.0–52.0)
Hemoglobin: 11 g/dL — ABNORMAL LOW (ref 13.0–17.0)
MCH: 26.8 pg (ref 26.0–34.0)
MCHC: 29.6 g/dL — ABNORMAL LOW (ref 30.0–36.0)
MCV: 90.3 fL (ref 80.0–100.0)
Platelets: 313 K/uL (ref 150–400)
RBC: 4.11 MIL/uL — ABNORMAL LOW (ref 4.22–5.81)
RDW: 15.4 % (ref 11.5–15.5)
WBC: 9.7 K/uL (ref 4.0–10.5)
nRBC: 0 % (ref 0.0–0.2)

## 2024-01-28 LAB — BASIC METABOLIC PANEL WITH GFR
Anion gap: 9 (ref 5–15)
BUN: 18 mg/dL (ref 8–23)
CO2: 24 mmol/L (ref 22–32)
Calcium: 9.2 mg/dL (ref 8.9–10.3)
Chloride: 108 mmol/L (ref 98–111)
Creatinine, Ser: 0.93 mg/dL (ref 0.61–1.24)
GFR, Estimated: >60 mL/min (ref >60)
Glucose, Bld: 108 mg/dL — ABNORMAL HIGH (ref 70–99)
Potassium: 4.4 mmol/L (ref 3.5–5.1)
Sodium: 141 mmol/L (ref 135–145)

## 2024-01-28 LAB — STREP PNEUMONIAE URINARY ANTIGEN: Strep Pneumo Urinary Antigen: NEGATIVE

## 2024-01-28 MED ORDER — TRAZODONE HCL 50 MG PO TABS: 25.0000 mg | ORAL_TABLET | Freq: Every evening | ORAL | Status: DC | PRN

## 2024-01-28 MED ORDER — SODIUM CHLORIDE 0.9 % IV SOLN
1.0000 g | Freq: Once | INTRAVENOUS | Status: AC
  Administered 2024-01-28: 1 g via INTRAVENOUS
  Filled 2024-01-28: qty 10

## 2024-01-28 MED ORDER — ATORVASTATIN CALCIUM 40 MG PO TABS
40.0000 mg | ORAL_TABLET | Freq: Every evening | ORAL | Status: DC
  Administered 2024-01-28 – 2024-02-01 (×5): 40 mg via ORAL
  Filled 2024-01-28 (×5): qty 1

## 2024-01-28 MED ORDER — CARVEDILOL 3.125 MG PO TABS
6.2500 mg | ORAL_TABLET | Freq: Two times a day (BID) | ORAL | Status: DC
  Administered 2024-01-28 – 2024-02-02 (×10): 6.25 mg via ORAL
  Filled 2024-01-28 (×10): qty 2

## 2024-01-28 MED ORDER — ACETAMINOPHEN 650 MG RE SUPP: 650.0000 mg | Freq: Four times a day (QID) | RECTAL | Status: DC | PRN

## 2024-01-28 MED ORDER — ACETAMINOPHEN 325 MG PO TABS
650.0000 mg | ORAL_TABLET | Freq: Four times a day (QID) | ORAL | Status: DC | PRN
  Administered 2024-01-28 – 2024-02-02 (×4): 650 mg via ORAL
  Filled 2024-01-28 (×4): qty 2

## 2024-01-28 MED ORDER — SODIUM CHLORIDE 0.9 % IV SOLN
500.0000 mg | Freq: Once | INTRAVENOUS | Status: AC
  Administered 2024-01-28: 500 mg via INTRAVENOUS
  Filled 2024-01-28: qty 5

## 2024-01-28 MED ORDER — ENOXAPARIN SODIUM 40 MG/0.4ML IJ SOSY
40.0000 mg | PREFILLED_SYRINGE | Freq: Every day | INTRAMUSCULAR | Status: DC
  Administered 2024-01-28 – 2024-02-02 (×5): 40 mg via SUBCUTANEOUS
  Filled 2024-01-28 (×5): qty 0.4

## 2024-01-28 MED ORDER — SODIUM CHLORIDE 0.9 % IV SOLN
500.0000 mg | INTRAVENOUS | Status: DC
  Administered 2024-01-29: 500 mg via INTRAVENOUS
  Filled 2024-01-28: qty 5

## 2024-01-28 MED ORDER — FLUOXETINE HCL 20 MG PO CAPS
20.0000 mg | ORAL_CAPSULE | Freq: Every day | ORAL | Status: DC
  Administered 2024-01-28 – 2024-02-02 (×6): 20 mg via ORAL
  Filled 2024-01-28 (×6): qty 1

## 2024-01-28 MED ORDER — FINASTERIDE 5 MG PO TABS
5.0000 mg | ORAL_TABLET | Freq: Every day | ORAL | Status: DC
  Administered 2024-01-28 – 2024-02-02 (×6): 5 mg via ORAL
  Filled 2024-01-28 (×6): qty 1

## 2024-01-28 MED ORDER — CEFTRIAXONE SODIUM 1 G IJ SOLR
1.0000 g | INTRAMUSCULAR | Status: DC
  Administered 2024-01-29 – 2024-02-02 (×5): 1 g via INTRAVENOUS
  Filled 2024-01-28 (×5): qty 10

## 2024-01-28 MED ORDER — ONDANSETRON HCL 4 MG/2ML IJ SOLN: 4.0000 mg | Freq: Four times a day (QID) | INTRAMUSCULAR | Status: DC | PRN

## 2024-01-28 MED ORDER — ONDANSETRON HCL 4 MG PO TABS: 4.0000 mg | ORAL_TABLET | Freq: Four times a day (QID) | ORAL | Status: DC | PRN

## 2024-01-28 MED ORDER — TAMSULOSIN HCL 0.4 MG PO CAPS
0.4000 mg | ORAL_CAPSULE | Freq: Every day | ORAL | Status: DC
  Administered 2024-01-28 – 2024-02-02 (×6): 0.4 mg via ORAL
  Filled 2024-01-28 (×6): qty 1

## 2024-01-28 MED ORDER — QUETIAPINE FUMARATE 50 MG PO TABS
50.0000 mg | ORAL_TABLET | Freq: Every day | ORAL | Status: DC
  Administered 2024-01-28 – 2024-02-01 (×5): 50 mg via ORAL
  Filled 2024-01-28: qty 1
  Filled 2024-01-28: qty 2
  Filled 2024-01-28 (×2): qty 1
  Filled 2024-01-28: qty 2
  Filled 2024-01-28: qty 1
  Filled 2024-01-28: qty 2
  Filled 2024-01-28: qty 1
  Filled 2024-01-28 (×3): qty 2

## 2024-01-28 MED ORDER — IOHEXOL 350 MG/ML SOLN
75.0000 mL | Freq: Once | INTRAVENOUS | Status: AC | PRN
  Administered 2024-01-28: 75 mL via INTRAVENOUS

## 2024-01-28 MED ORDER — ALBUTEROL SULFATE (2.5 MG/3ML) 0.083% IN NEBU: 2.5000 mg | INHALATION_SOLUTION | RESPIRATORY_TRACT | Status: DC | PRN

## 2024-01-28 MED ORDER — CLOPIDOGREL BISULFATE 75 MG PO TABS
75.0000 mg | ORAL_TABLET | Freq: Every day | ORAL | Status: DC
  Administered 2024-01-28 – 2024-02-02 (×6): 75 mg via ORAL
  Filled 2024-01-28 (×6): qty 1

## 2024-01-28 NOTE — Plan of Care (Signed)
  Problem: Clinical Measurements: Goal: Ability to maintain clinical measurements within normal limits will improve Outcome: Progressing   Problem: Clinical Measurements: Goal: Will remain free from infection Outcome: Progressing   Problem: Nutrition: Goal: Adequate nutrition will be maintained Outcome: Progressing   

## 2024-01-28 NOTE — ED Provider Notes (Signed)
 [place] EMERGENCY DEPARTMENT AT [facility] Provider Note   CSN: [id number] Arrival date & time: 01/28/24  0157     Patient presents with: Shortness of Breath   Dennis [name] is a 78 y.o. male.   The history is provided by the patient and medical records.  Shortness of Breath  78 year old male with history of dementia, prior stroke, expressive aphasia, hypertension, hyperlipidemia, presenting to the ED with shortness of breath.  Apparently began having this about 30 minutes prior to calling EMS.  He does report he has had a recent cough but denies any fever.  No nausea or vomiting.  He denies any chest pain.  Recent admission 01/17/2024 to 01/20/2024 for same, negative COVID and flu testing at that time but felt ultimately to be viral in nature.  Just finished course of Augmentin .  Prior to Admission medications   Medication Sig Start Date End Date Taking? Authorizing Provider  aspirin  EC 81 MG tablet Take one tablet daily till 01/20/2024 and then discontinue. 12/30/23   Mdala-Gausi, Masiku Agatha, MD  atorvastatin  (LIPITOR ) 40 MG tablet Take 1 tablet (40 mg total) by mouth daily. Patient taking differently: Take 40 mg by mouth every evening. 03/16/23   Ghimire, Donalda HERO, MD  carvedilol  (COREG ) 6.25 MG tablet Take 0.5 tablets (3.125 mg total) by mouth 2 (two) times daily with a meal. 01/20/24   Rai, Ripudeep K, MD  clopidogrel  (PLAVIX ) 75 MG tablet Take 1 tablet (75 mg total) by mouth daily. 12/31/23   Mdala-Gausi, Masiku Agatha, MD  docusate sodium  (COLACE) 100 MG capsule Take 1 capsule (100 mg total) by mouth 2 (two) times daily. 08/05/23   Darci Pore, MD  finasteride  (PROSCAR ) 5 MG tablet Take 1 tablet (5 mg total) by mouth daily. Patient taking differently: Take 5 mg by mouth in the morning. 03/16/23   Ghimire, Donalda HERO, MD  FLUoxetine  (PROZAC ) 20 MG capsule Take 20 mg by mouth in the morning.    [provider]  folic acid  (FOLVITE ) 1 MG tablet Take 1  tablet (1 mg total) by mouth daily. Patient taking differently: Take 1 mg by mouth in the morning. 03/16/23   Ghimire, Donalda HERO, MD  pantoprazole  (PROTONIX ) 40 MG tablet Take 1 tablet (40 mg total) by mouth daily. Patient taking differently: Take 40 mg by mouth in the morning. 08/06/23   Darci Pore, MD  polyethylene glycol (MIRALAX  / GLYCOLAX ) 17 g packet Take 17 g by mouth daily as needed. 08/05/23   Darci Pore, MD  QUEtiapine  (SEROQUEL ) 50 MG tablet Take 1 tablet (50 mg total) by mouth at bedtime. 12/30/23   Mdala-Gausi, Masiku Agatha, MD  senna-docusate (SENOKOT-S) 8.6-50 MG tablet Take 1 tablet by mouth at bedtime as needed for mild constipation. 08/05/23   Darci Pore, MD  tamsulosin  (FLOMAX ) 0.4 MG CAPS capsule Take 1 capsule (0.4 mg total) by mouth daily. Patient taking differently: Take 0.4 mg by mouth in the morning. 03/16/23   Ghimire, Donalda HERO, MD    Allergies: Codeine and Shellfish allergy    Review of Systems  Respiratory:  Positive for shortness of breath.   All other systems reviewed and are negative.   Updated Vital Signs BP (!) 130/59   Pulse (!) 52   Temp 98 F (36.7 C) (Oral)   Resp 14   SpO2 93%   Physical Exam Vitals and nursing note reviewed.  Constitutional:      Appearance: He is well-developed.  HENT:  Head: Normocephalic and atraumatic.  Eyes:     Conjunctiva/sclera: Conjunctivae normal.     Pupils: Pupils are equal, round, and reactive to light.  Cardiovascular:     Rate and Rhythm: Normal rate and regular rhythm.     Heart sounds: Normal heart sounds.  Pulmonary:     Effort: Pulmonary effort is normal.     Breath sounds: Normal breath sounds. No wheezing or rhonchi.     Comments: No distress, sats ranging 92-95 during exam, Abdominal:     General: Bowel sounds are normal.     Palpations: Abdomen is soft.  Musculoskeletal:        General: Normal range of motion.     Cervical back: Normal range of motion.   Skin:    General: Skin is warm and dry.  Neurological:     Mental Status: He is alert and oriented to person, place, and time.     (all labs ordered are listed, but only abnormal results are displayed) Labs Reviewed  BASIC METABOLIC PANEL WITH GFR - Abnormal; Notable for the following components:      Result Value   Glucose, Bld 108 (*)    All other components within normal limits  CBC - Abnormal; Notable for the following components:   RBC 4.11 (*)    Hemoglobin 11.0 (*)    HCT 37.1 (*)    MCHC 29.6 (*)    All other components within normal limits  RESP PANEL BY RT-PCR (RSV, FLU A&B, COVID)  [id number]    EKG: None  Radiology: CT Angio Chest PE W and/or Wo Contrast Result Date: 01/28/2024 EXAM: CTA of the Chest with contrast for PE 01/28/2024 03:41:00 AM TECHNIQUE: CTA of the chest was performed after the administration of intravenous contrast. Multiplanar reformatted images are provided for review. MIP images are provided for review. Automated exposure control, iterative reconstruction, and/or weight based adjustment of the mA/kV was utilized to reduce the radiation dose to as low as reasonably achievable. COMPARISON: Same day chest x-ray. CLINICAL HISTORY: Pulmonary embolism (PE) suspected, low to intermediate prob, positive D-dimer; SOB, ? nodule on x-ray, hypoxia. FINDINGS: PULMONARY ARTERIES: Pulmonary arteries are adequately opacified for evaluation. No pulmonary embolism. Main pulmonary artery is normal in caliber. MEDIASTINUM: The heart and pericardium demonstrate no acute abnormality. Coronary artery and aortic atherosclerotic calcification. Normal caliber thoracic aorta. LYMPH NODES: No mediastinal, hilar or axillary lymphadenopathy. LUNGS AND PLEURA: Clustering nodularity in the bilateral upper lobes. Diffuse bronchial wall thickening with scattered mucus plugging. Scattered pulmonary nodules, for example, a 9 mm nodule in series 12, image 99, in the left lower lobe, 11 mm  subpleural nodule in the anterior lingula on 04/23/21, 13 x 8 mm subpleural nodule in the anterior right upper lobe on series 12 image 70. These are favored to be infectious/inflammatory. There are patchy peribronchovascular ground-glass opacities bilaterally. No pleural effusion or pneumothorax. UPPER ABDOMEN: Limited images of the upper abdomen are unremarkable. SOFT TISSUES AND BONES: No acute bone or soft tissue abnormality. IMPRESSION: 1. No pulmonary embolism. 2. Findings favor bronchopneumonia. 3. Multiple solid pulmonary nodules >8 mm, favored related to #2. Follow-up CT in 3 months is recommended to ensure resolution. Electronically signed by: Norman Gatlin MD 01/28/2024 04:02 AM EDT RP Workstation: [id number]   DG Chest 2 View Result Date: 01/28/2024 EXAM: 2 VIEW(S) XRAY OF THE CHEST 01/28/2024 02:30:00 AM COMPARISON: 01/17/2024 CLINICAL HISTORY: shob. Shortness of breath FINDINGS: LUNGS AND PLEURA: Streaky bibasilar opacities, likely atelectasis. Nodular opacity at right apex  is favored secondary to degenerative change at the 1st costo- sternal junction. No pleural effusion. No pneumothorax. HEART AND MEDIASTINUM: No acute abnormality of the cardiac and mediastinal silhouettes. Atherosclerotic calcifications. BONES AND SOFT TISSUES: Mild degenerative changes of thoracic spine. No acute osseous abnormality. IMPRESSION: 1. Streaky bibasilar opacities, likely atelectasis. 2. Nodular opacity at right apex, favored secondary to degenerative change at the 1st costo-sternal junction. If desired, a pulmonary nodule could be excluded with non-emergent CT chest. Electronically signed by: Norman Gatlin MD 01/28/2024 02:42 AM EDT RP Workstation: [id number]     Procedures   CRITICAL CARE Performed by: Olam CHRISTELLA [place]   Total critical care time: 35 minutes  Critical care time was exclusive of separately billable procedures and treating other patients.  Critical care was necessary to treat or prevent  imminent or life-threatening deterioration.  Critical care was time spent personally by me on the following activities: development of treatment plan with patient and/or surrogate as well as nursing, discussions with consultants, evaluation of patient's response to treatment, examination of patient, obtaining history from patient or surrogate, ordering and performing treatments and interventions, ordering and review of laboratory studies, ordering and review of radiographic studies, pulse oximetry and re-evaluation of patient's condition.   Medications Ordered in the ED  cefTRIAXone  (ROCEPHIN ) 1 g in sodium chloride  0.9 % 100 mL IVPB (has no administration in time range)  azithromycin  (ZITHROMAX ) 500 mg in sodium chloride  0.9 % 250 mL IVPB (has no administration in time range)  iohexol  (OMNIPAQUE ) 350 MG/ML injection 75 mL (75 mLs Intravenous Contrast Given 01/28/24 0351)                                    Medical Decision Making Amount and/or Complexity of Data Reviewed Labs: ordered. Radiology: ordered and independent interpretation performed. ECG/medicine tests: ordered and independent interpretation performed.  Risk Prescription drug management. Decision regarding hospitalization.   78 year old male presenting to the ED from nursing facility due to shortness of breath.  Recent admission for viral URI, just finished course of Augmentin  a few days ago.  Denies any chest pain.  Does report ongoing cough.  He is afebrile and nontoxic in appearance here.  He is in no acute distress but saturations are low to normal around 92 to 94% on room air.  Labs were obtained upon arrival--no leukocytosis or electrolyte derangement.  Chest x-ray favoring atelectasis.  Questionable pulmonary nodule.  Patient was recently hospitalized for similar.  Will obtain CTA for further evaluation along with repeat RVP.  RVP is negative.  CTA without findings of PE but does have bronchopneumonia.  Sats remain low  to normal on room air while at rest.  He does report he feels more short of breath with movement.  Will restart antibiotics and plan for repeat admission.  Discussed with hospitalist, Dr. Charlton-- will admit for ongoing care.  Final diagnoses:  Community acquired pneumonia, unspecified laterality    ED Discharge Orders     None          [place] Olam CHRISTELLA, PA-C 01/28/24 0441    Theadore Ozell CHRISTELLA, MD 01/28/24 [phone]

## 2024-01-28 NOTE — H&P (Addendum)
 History and Physical  Dennis [name] FMW:[id number] DOB: 05-Jan-1946 DOA: 01/28/2024  PCP: Pcp, No   Chief Complaint: shortness of breath   HPI: Dennis [name] is a 78 y.o. male with medical history significant for hypertension, dementia recent hospitalization for sepsis likely due to viral pneumonia now being admitted to the hospital with community-acquired pneumonia.  Patient has advanced dementia has a history of CVA he is not able to provide any history.  Apparently he was quite short of breath at his facility early this morning he was brought by EMS for evaluation.  He was stable on room air but placed on 2 L oxygen initially for comfort.  He tells me that he had a cough recently, but cannot provide much other history.  He denies any chest pain, nausea or vomiting.  He just finished a course of Augmentin  that was prescribed at hospital discharge on 9/2.  Review of Systems: Please see HPI for pertinent positives and negatives. A complete 10 system review of systems are otherwise negative.  Past Medical History:  Diagnosis Date   Arthritis    Dementia (HCC)    Hypertension    Past Surgical History:  Procedure Laterality Date   CHOLECYSTECTOMY     THROAT SURGERY     Social History:  reports that he has quit smoking. His smoking use included cigarettes. He has never used smokeless tobacco. He reports that he does not drink alcohol  and does not use drugs.  Allergies  Allergen Reactions   Codeine    Shellfish Allergy     History reviewed. No pertinent family history.   Prior to Admission medications   Medication Sig Start Date End Date Taking? Authorizing Provider  atorvastatin  (LIPITOR ) 40 MG tablet Take 1 tablet (40 mg total) by mouth daily. Patient taking differently: Take 40 mg by mouth every evening. 03/16/23  Yes Ghimire, Donalda HERO, MD  carvedilol  (COREG ) 6.25 MG tablet Take 0.5 tablets (3.125 mg total) by mouth 2 (two) times daily with a meal. 01/20/24  Yes Rai, Ripudeep K, MD   QUEtiapine  (SEROQUEL ) 50 MG tablet Take 1 tablet (50 mg total) by mouth at bedtime. 12/30/23  Yes Mdala-Gausi, Masiku Agatha, MD  senna-docusate (SENOKOT-S) 8.6-50 MG tablet Take 1 tablet by mouth at bedtime as needed for mild constipation. 08/05/23  Yes Darci Pore, MD  tamsulosin  (FLOMAX ) 0.4 MG CAPS capsule Take 1 capsule (0.4 mg total) by mouth daily. Patient taking differently: Take 0.4 mg by mouth in the morning. 03/16/23  Yes Ghimire, Donalda HERO, MD  aspirin  EC 81 MG tablet Take one tablet daily till 01/20/2024 and then discontinue. 12/30/23   Mdala-Gausi, Masiku Agatha, MD  clopidogrel  (PLAVIX ) 75 MG tablet Take 1 tablet (75 mg total) by mouth daily. 12/31/23   Mdala-Gausi, Masiku Agatha, MD  docusate sodium  (COLACE) 100 MG capsule Take 1 capsule (100 mg total) by mouth 2 (two) times daily. 08/05/23   Darci Pore, MD  finasteride  (PROSCAR ) 5 MG tablet Take 1 tablet (5 mg total) by mouth daily. Patient taking differently: Take 5 mg by mouth in the morning. 03/16/23   Ghimire, Donalda HERO, MD  FLUoxetine  (PROZAC ) 20 MG capsule Take 20 mg by mouth in the morning.    [provider]  folic acid  (FOLVITE ) 1 MG tablet Take 1 tablet (1 mg total) by mouth daily. Patient taking differently: Take 1 mg by mouth in the morning. 03/16/23   Ghimire, Donalda HERO, MD  pantoprazole  (PROTONIX ) 40 MG tablet Take 1 tablet (40  mg total) by mouth daily. Patient taking differently: Take 40 mg by mouth in the morning. 08/06/23   Darci Pore, MD  polyethylene glycol (MIRALAX  / GLYCOLAX ) 17 g packet Take 17 g by mouth daily as needed. 08/05/23   Darci Pore, MD    Physical Exam: BP 135/63   Pulse (!) 57   Temp 97.6 F (36.4 C) (Oral)   Resp 16   SpO2 95%  General:  Alert, oriented to self and place, calm, in no acute distress  Cardiovascular: RRR, no murmurs or rubs, no peripheral edema  Respiratory: clear to auscultation bilaterally, no wheezes, no crackles  Abdomen:  soft, nontender, nondistended, normal bowel tones heard  Skin: dry, no rashes  Musculoskeletal: no joint effusions, normal range of motion  Psychiatric: appropriate affect, normal speech  Neurologic: extraocular muscles intact, clear speech, moving all extremities with intact sensorium         Labs on Admission:  Basic Metabolic Panel: Recent Labs  Lab 01/28/24 0213  NA 141  K 4.4  CL 108  CO2 24  GLUCOSE 108*  BUN 18  CREATININE 0.93  CALCIUM  9.2   Liver Function Tests: No results for input(s): AST, ALT, ALKPHOS, BILITOT, PROT, ALBUMIN in the last 168 hours. No results for input(s): LIPASE, AMYLASE in the last 168 hours. No results for input(s): AMMONIA in the last 168 hours. CBC: Recent Labs  Lab 01/28/24 0213  WBC 9.7  HGB 11.0*  HCT 37.1*  MCV 90.3  PLT 313   Cardiac Enzymes: No results for input(s): CKTOTAL, CKMB, CKMBINDEX, TROPONINI in the last 168 hours. BNP (last 3 results) Recent Labs    03/01/23 0245 03/03/23 0313 06/22/23 1251  BNP 1,535.3* 489.2* 946.6*    ProBNP (last 3 results) No results for input(s): PROBNP in the last 8760 hours.  CBG: No results for input(s): GLUCAP in the last 168 hours.  Radiological Exams on Admission: CT Angio Chest PE W and/or Wo Contrast Result Date: 01/28/2024 EXAM: CTA of the Chest with contrast for PE 01/28/2024 03:41:00 AM TECHNIQUE: CTA of the chest was performed after the administration of intravenous contrast. Multiplanar reformatted images are provided for review. MIP images are provided for review. Automated exposure control, iterative reconstruction, and/or weight based adjustment of the mA/kV was utilized to reduce the radiation dose to as low as reasonably achievable. COMPARISON: Same day chest x-ray. CLINICAL HISTORY: Pulmonary embolism (PE) suspected, low to intermediate prob, positive D-dimer; SOB, ? nodule on x-ray, hypoxia. FINDINGS: PULMONARY ARTERIES: Pulmonary arteries are  adequately opacified for evaluation. No pulmonary embolism. Main pulmonary artery is normal in caliber. MEDIASTINUM: The heart and pericardium demonstrate no acute abnormality. Coronary artery and aortic atherosclerotic calcification. Normal caliber thoracic aorta. LYMPH NODES: No mediastinal, hilar or axillary lymphadenopathy. LUNGS AND PLEURA: Clustering nodularity in the bilateral upper lobes. Diffuse bronchial wall thickening with scattered mucus plugging. Scattered pulmonary nodules, for example, a 9 mm nodule in series 12, image 99, in the left lower lobe, 11 mm subpleural nodule in the anterior lingula on 04/23/21, 13 x 8 mm subpleural nodule in the anterior right upper lobe on series 12 image 70. These are favored to be infectious/inflammatory. There are patchy peribronchovascular ground-glass opacities bilaterally. No pleural effusion or pneumothorax. UPPER ABDOMEN: Limited images of the upper abdomen are unremarkable. SOFT TISSUES AND BONES: No acute bone or soft tissue abnormality. IMPRESSION: 1. No pulmonary embolism. 2. Findings favor bronchopneumonia. 3. Multiple solid pulmonary nodules >8 mm, favored related to #2. Follow-up CT in  3 months is recommended to ensure resolution. Electronically signed by: Norman Gatlin MD 01/28/2024 04:02 AM EDT RP Workstation: [id number]   DG Chest 2 View Result Date: 01/28/2024 EXAM: 2 VIEW(S) XRAY OF THE CHEST 01/28/2024 02:30:00 AM COMPARISON: 01/17/2024 CLINICAL HISTORY: shob. Shortness of breath FINDINGS: LUNGS AND PLEURA: Streaky bibasilar opacities, likely atelectasis. Nodular opacity at right apex is favored secondary to degenerative change at the 1st costo- sternal junction. No pleural effusion. No pneumothorax. HEART AND MEDIASTINUM: No acute abnormality of the cardiac and mediastinal silhouettes. Atherosclerotic calcifications. BONES AND SOFT TISSUES: Mild degenerative changes of thoracic spine. No acute osseous abnormality. IMPRESSION: 1. Streaky  bibasilar opacities, likely atelectasis. 2. Nodular opacity at right apex, favored secondary to degenerative change at the 1st costo-sternal junction. If desired, a pulmonary nodule could be excluded with non-emergent CT chest. Electronically signed by: Norman Gatlin MD 01/28/2024 02:42 AM EDT RP Workstation: [id number]   Assessment/Plan Dennis [name] is a 78 y.o. male with medical history significant for hypertension, dementia recent hospitalization for sepsis likely due to viral pneumonia now being admitted to the hospital with community-acquired pneumonia.  Community-acquired pneumonia-not meeting sepsis criteria, currently stable on room air.  CT findings as above favor bronchopneumonia. -Inpatient admission -Monitor for hypoxia -Empiric IV azithromycin  and IV Rocephin  -Will check strep pneumo and Legionella antigen  Hyperlipidemia-atorvastatin   Dementia-continue Seroquel , paroxetine; delirium precautions  History of CVA-Plavix  daily  BPH-finasteride , Flomax   Hypertension-continue Coreg  at recently increased dose of 6.25 mg twice daily  Pulmonary nodules-he has multiple solid pulmonary nodules, likely related to his pneumonia -Outpatient repeat CT scan is recommended in approximately 3 months  DVT prophylaxis: Lovenox      Code Status: Full Code  Consults called: None  Admission status: The appropriate patient status for this patient is INPATIENT. Inpatient status is judged to be reasonable and necessary in order to provide the required intensity of service to ensure the patient's safety. The patient's presenting symptoms, physical exam findings, and initial radiographic and laboratory data in the context of their chronic comorbidities is felt to place them at high risk for further clinical deterioration. Furthermore, it is not anticipated that the patient will be medically stable for discharge from the hospital within 2 midnights of admission.    I certify that at the point of  admission it is my clinical judgment that the patient will require inpatient hospital care spanning beyond 2 midnights from the point of admission due to high intensity of service, high risk for further deterioration and high frequency of surveillance required  Time spent: 53 minutes  Harshita Bernales CHRISTELLA Gail MD [facility] Pager [phone]  If 7PM-7AM, please contact night-coverage [email or web address] Password [email or web address]  01/28/2024, 7:40 AM

## 2024-01-28 NOTE — ED Notes (Signed)
 RN at bedside.   Pt found to be wearing two shirts under hospital gown, no pants. Pt was undressed, tele leads replaced, gown placed. Pt assisted with urinal. Bed linens fixed. Pt denies further needs at this time.

## 2024-01-28 NOTE — ED Triage Notes (Signed)
 Pt arrives from [facility] w/ [facility]. Facility called out due to pt c/o SHOB x 30 mins. Pt dementia at baseline. VSS. 2L O2 placed on pt for comfort

## 2024-01-28 NOTE — ED Notes (Signed)
 Unit called. RN unavailable. Unit clerk states OK to transfer pt.

## 2024-01-28 NOTE — ED Notes (Signed)
 Patient transported to CT

## 2024-01-28 NOTE — Plan of Care (Signed)

## 2024-01-29 DIAGNOSIS — F0390 Unspecified dementia without behavioral disturbance: Secondary | ICD-10-CM

## 2024-01-29 DIAGNOSIS — J180 Bronchopneumonia, unspecified organism: Secondary | ICD-10-CM | POA: Diagnosis not present

## 2024-01-29 LAB — BASIC METABOLIC PANEL WITH GFR
Anion gap: 8 (ref 5–15)
BUN: 15 mg/dL (ref 8–23)
CO2: 26 mmol/L (ref 22–32)
Calcium: 9.1 mg/dL (ref 8.9–10.3)
Chloride: 108 mmol/L (ref 98–111)
Creatinine, Ser: 0.96 mg/dL (ref 0.61–1.24)
GFR, Estimated: >60 mL/min (ref >60)
Glucose, Bld: 103 mg/dL — ABNORMAL HIGH (ref 70–99)
Potassium: 4.1 mmol/L (ref 3.5–5.1)
Sodium: 141 mmol/L (ref 135–145)

## 2024-01-29 LAB — CBC
HCT: 37.3 % — ABNORMAL LOW (ref 39.0–52.0)
Hemoglobin: 11.5 g/dL — ABNORMAL LOW (ref 13.0–17.0)
MCH: 27.7 pg (ref 26.0–34.0)
MCHC: 30.8 g/dL (ref 30.0–36.0)
MCV: 89.9 fL (ref 80.0–100.0)
Platelets: 287 K/uL (ref 150–400)
RBC: 4.15 MIL/uL — ABNORMAL LOW (ref 4.22–5.81)
RDW: 15.3 % (ref 11.5–15.5)
WBC: 8.3 K/uL (ref 4.0–10.5)
nRBC: 0 % (ref 0.0–0.2)

## 2024-01-29 LAB — MAGNESIUM: Magnesium: 2.2 mg/dL (ref 1.7–2.4)

## 2024-01-29 MED ORDER — POLYETHYLENE GLYCOL 3350 17 G PO PACK: 17.0000 g | PACK | Freq: Every day | ORAL | Status: DC | PRN

## 2024-01-29 MED ORDER — FOLIC ACID 1 MG PO TABS
1.0000 mg | ORAL_TABLET | Freq: Every day | ORAL | Status: DC
  Administered 2024-01-29 – 2024-02-02 (×5): 1 mg via ORAL
  Filled 2024-01-29 (×5): qty 1

## 2024-01-29 MED ORDER — SENNOSIDES-DOCUSATE SODIUM 8.6-50 MG PO TABS
1.0000 | ORAL_TABLET | Freq: Every day | ORAL | Status: DC
  Administered 2024-01-29 – 2024-01-31 (×3): 1 via ORAL
  Filled 2024-01-29 (×3): qty 1

## 2024-01-29 MED ORDER — AZITHROMYCIN 250 MG PO TABS
500.0000 mg | ORAL_TABLET | Freq: Every day | ORAL | Status: DC
  Administered 2024-01-30 – 2024-02-02 (×4): 500 mg via ORAL
  Filled 2024-01-29 (×4): qty 2

## 2024-01-29 MED ORDER — PANTOPRAZOLE SODIUM 40 MG PO TBEC
40.0000 mg | DELAYED_RELEASE_TABLET | Freq: Every day | ORAL | Status: DC
  Administered 2024-01-29 – 2024-02-02 (×5): 40 mg via ORAL
  Filled 2024-01-29 (×5): qty 1

## 2024-01-29 NOTE — TOC Progression Note (Incomplete)
 Transition of Care [facility]) - Progression Note    Patient Details  Name: Dennis [name] MRN: [id number] Date of Birth: 1945/09/29  [facility] CM/SW Contact  Sonda Manuella Quill, RN Phone Number: 01/29/2024, 2:19 PM  Clinical Narrative:    Pt is from [facility]; LVM for pt's legal guardian, [name] [phone]) from [facility] DSS; awaiting return call to discuss d/c plan.                     Expected Discharge Plan and Services                                               Social Drivers of Health (SDOH) Interventions SDOH Screenings   Food Insecurity: No Food Insecurity (09/17/2023)  Housing: Patient Unable To Answer (01/28/2024)  Transportation Needs: No Transportation Needs (09/17/2023)  Utilities: Not At Risk (09/17/2023)  Social Connections: Moderately Isolated (09/17/2023)  Tobacco Use: Medium Risk (01/28/2024)    Readmission Risk Interventions     No data to display

## 2024-01-29 NOTE — Plan of Care (Signed)

## 2024-01-29 NOTE — Evaluation (Addendum)
 Clinical/Bedside Swallow Evaluation Patient Details  Name: Dennis [name] MRN: [id number] Date of Birth: 1946-01-24  Today's Date: 01/29/2024 Time: SLP Start Time (ACUTE ONLY): 1605 SLP Stop Time (ACUTE ONLY): 1625 SLP Time Calculation (min) (ACUTE ONLY): 20 min  Past Medical History:  Past Medical History:  Diagnosis Date   Arthritis    Dementia (HCC)    Hypertension    Past Surgical History:  Past Surgical History:  Procedure Laterality Date   CHOLECYSTECTOMY     THROAT SURGERY     HPI:  pt is a26 yo h/o of CVA, dementia, HTN, and hyperlipidemia, recent admission 12/28/2023-12/30/2023 (for TIA) and acute encephalopathy presented from [facility] memory care facility diagnosed with pna.  Prior MRI (06/21/23) revealed, Punctate acute infarct in the left posterior subinsular white matter... small area of acute infarct in the more superior left frontal cortex. There also 2 punctate areas of diffusion hyperintensity without ADC correlate in the superior right frontal lobe which may be areas of subacute ischemia  Pt also with h/o COPD, Barrett's esophagus, RA and OSA.  Some question if pt had h/o throat cancer per note from [facility] - but this was disputed in another note.  Pt had Barrett's esophagus and is s/p ablation - with 2 most recent UGI studies from 2023 and 2024 at [facility]- not showing evidence of recurrence of Barrett's, normal esophagus and 2 cm hiatal hernia.  Patient presents with significant improvement in swallow function with improved cognition likely impacting his performance. excessive movements, restlessness and decreased attention. CT chest Findings favor bronchopneumonia. Findings favor bronchopneumonia, Multiple solid pulmonary nodule, these are favored to be infectious/inflammatory. There are patchy peribronchovascular ground-glass opacities bilaterally.    Assessment / Plan / Recommendation  Clinical Impression  Patient presents with functional oropharyngeal swallow based  on clinical swallow evaluation.  He easily passed 3 ounce Yale swallow screen. Adequate oral clearance across all boluses noted including cookie, graham crackers with peanut butter, soda and water .    Pt able to feed himself easily with full oral clearance and consumed po slowly today. Some dentition missing - but adequate mastication present.  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  The last two times he was admitted, he has been very frustrated and it's difficult to understand.   No aspiration risk noted today - Has h/o Barrett's and hiatal hernia that may contributed to aspiration risk.  Per notes, pt wit h/o throat surgery but note from [facility] denies pt has h/o throat cancer.  Recommend continue diet as tolerated - No SLP follow up. Thanks for this consult.  SLP Visit Diagnosis: Dysphagia, unspecified (R13.10)    Aspiration Risk  Mild aspiration risk    Diet Recommendation Regular;Thin liquid    Liquid Administration via: Cup;Straw Medication Administration: Whole meds with liquid Supervision: Patient able to self feed Compensations: Slow rate;Small sips/bites    Other  Recommendations Oral Care Recommendations: Oral care BID     Assistance Recommended at Discharge  N/a  Functional Status Assessment Patient has not had a recent decline in their functional status  Frequency and Duration         N/a   Prognosis    N/a    Swallow Study   General Date of Onset: 01/29/24 HPI: pt is a62 yo h/o of CVA, dementia, HTN, and hyperlipidemia, recent admission 12/28/2023-12/30/2023 (for TIA) and acute  encephalopathy presented from [facility] memory care facility diagnosed with pna.  Prior MRI (06/21/23) revealed, Punctate acute infarct in the left posterior subinsular white matter... small area of acute infarct in the more superior left frontal cortex. There also 2 punctate areas of  diffusion hyperintensity without ADC correlate in the superior right frontal lobe which may be areas of subacute ischemia  Pt also with h/o COPD, Barrett's esophagus, RA and OSA.  Some question if pt had h/o throat cancer per note from [facility] - but this was disputed in another note.  Pt had Barrett's esophagus and is s/p ablation - with 2 most recent UGI studies from 2023 and 2024 at [facility]- not showing evidence of recurrence of Barrett's, normal esophagus and 2 cm hiatal hernia. Type of Study: Bedside Swallow Evaluation Diet Prior to this Study: Regular;Thin liquids (Level 0) Temperature Spikes Noted: No Respiratory Status: Room air History of Recent Intubation: No Behavior/Cognition: Alert;Cooperative;Pleasant mood Oral Cavity Assessment: Within Functional Limits Oral Care Completed by SLP: No Oral Cavity - Dentition: Adequate natural dentition Vision: Functional for self-feeding Self-Feeding Abilities: Able to feed self Patient Positioning: Upright in bed Baseline Vocal Quality: Normal Volitional Cough: Strong Volitional Swallow: Unable to elicit    Oral/Motor/Sensory Function Overall Oral Motor/Sensory Function: Mild impairment Facial ROM: Within Functional Limits (slight right  facial asymmetry) Facial Symmetry: Abnormal symmetry right Facial Strength: Within Functional Limits Facial Sensation: Within Functional Limits Lingual ROM: Within Functional Limits Lingual Symmetry: Within Functional Limits Lingual Strength: Within Functional Limits (slight dysarthria) Lingual Sensation: Within Functional Limits Velum: Within Functional Limits Mandible: Within Functional Limits   Ice Chips Ice chips: Not tested   Thin Liquid Thin Liquid: Within functional limits Presentation: Straw    Nectar Thick Nectar Thick Liquid: Not tested   Honey Thick Honey Thick Liquid: Not tested   Puree Puree: Within functional limits Presentation: Self Fed;Spoon   Solid     Solid: Within functional  limits Presentation: Self Fed;Spoon      Nicolas Emmie Caldron 01/29/2024,4:55 PM  Madelin POUR, MS [name] SLP Acute Rehab Services Office [phone]

## 2024-01-29 NOTE — TOC Initial Note (Signed)
 Transition of Care [facility]) - Initial/Assessment Note    Patient Details  Name: Dennis [name] MRN: [id number] Date of Birth: March 29, 1946  [facility] CM/SW Contact:    Sonda Manuella Quill, RN Phone Number: 01/29/2024, 3:40 PM  Clinical Narrative:                 Beatris w/ Camelia Hsu, RN from [facility]; she said pt is from memory care, and can return at d/c; FL2, and d/c summary needed and must be received by 1 PM on Friday 01/30/24; she said facility does not accept admissions over weekend; she can be contacted at [phone]; Crystal said pt does not have glasses, dentures, or HA; he ambulates independently, and is incontinent of bowel and bladder; she also said pt is able to feed himself; he needs assist w/ bathing, and cues for dressing; TOC is following.  -1502-return call from legal guardian [name], [facility] DSS; he plans for pt to return to facility at d/c;  Expected Discharge Plan: Memory Care Barriers to Discharge: Continued Medical Work up   Patient Goals and CMS Choice Patient states their goals for this hospitalization and ongoing recovery are:: return to memory care CMS Medicare.gov Compare Post Acute Care list provided to:: Legal Guardian ([name] Subbs, [facility] DSS)        Expected Discharge Plan and Services   Discharge Planning Services: CM Consult                     DME Arranged: N/A DME Agency: NA       HH Arranged: NA HH Agency: NA        Prior Living Arrangements/Services   Lives with:: Facility Resident Patient language and need for interpreter reviewed:: Yes Do you feel safe going back to the place where you live?: Yes      Need for Family Participation in Patient Care: Yes (Comment) Care giver support system in place?: Yes (comment) Current home services:  (n/a) Criminal Activity/Legal Involvement Pertinent to Current Situation/Hospitalization: No - Comment as needed  Activities of Daily Living    ADL Screening (condition at time of admission) Independently performs ADLs?: No Does the patient have a NEW difficulty with bathing/dressing/toileting/self-feeding that is expected to last >3 days?: No Does the patient have a NEW difficulty with getting in/out of bed, walking, or climbing stairs that is expected to last >3 days?: No Does the patient have a NEW difficulty with communication that is expected to last >3 days?: No Is the patient deaf or have difficulty hearing?: No Does the patient have difficulty seeing, even when wearing glasses/contacts?: No Does the patient have difficulty concentrating, remembering, or making decisions?: Yes  Permission Sought/Granted Permission sought to share information with : Case Manager Permission granted to share information with : Yes, Verbal Permission Granted  Share Information with NAME: Case Manager     Permission granted to share info w Relationship: [name] (legal guardian) [facility] DSS     Emotional Assessment Appearance:: Other (Comment Required (unable to assess) Attitude/Demeanor/Rapport: Unable to Assess Affect (typically observed): Unable to Assess Orientation: :  (unable to assess) Alcohol  / Substance Use: Not Applicable Psych Involvement: No (comment)  Admission diagnosis:  Bronchopneumonia [J18.0] Community acquired pneumonia, unspecified laterality [J18.9] Patient Active Problem List   Diagnosis Date Noted   Bronchopneumonia 01/28/2024   Sepsis (HCC) 01/17/2024   Pneumonia 01/17/2024   Expressive aphasia 12/29/2023   Acute encephalopathy 12/29/2023   History of  essential hypertension 12/29/2023   Anemia of chronic disease 12/29/2023   Dysarthria 12/28/2023   Syncope 09/16/2023   AKI (acute kidney injury) (HCC) 09/16/2023   UTI (urinary tract infection) 09/16/2023   Acute metabolic encephalopathy 09/16/2023   History of stroke 09/16/2023   Agitation 07/10/2023   Dysphagia 07/02/2023   Dementia with  behavioral disturbance (HCC) 07/02/2023   Acute CVA (cerebrovascular accident) (HCC) 06/21/2023   BPH (benign prostatic hyperplasia) 06/21/2023   Altered mental state 02/22/2023   Difficulty walking 02/22/2023   HLD (hyperlipidemia) 06/02/2009   HTN (hypertension) 06/02/2009   AORTIC INSUFFICIENCY, MILD 06/02/2009   DYSPNEA ON EXERTION 06/02/2009   CHEST PAIN, ATYPICAL 06/02/2009   PCP:  Freddrick No Pharmacy:   [facility] [address] ([address]), [place] - [address] [place] ([place]) [place] [address] Phone: [phone] Fax: [phone]  [facility] - [place], [place] - [address] [facility] [address] [place] [address] Phone: [phone] Fax: [phone]     Social Drivers of Health (SDOH) Social History: SDOH Screenings   Food Insecurity: No Food Insecurity (01/29/2024)  Housing: Low Risk  (01/29/2024)  Transportation Needs: No Transportation Needs (01/29/2024)  Utilities: Not At Risk (01/29/2024)  Social Connections: Moderately Isolated (09/17/2023)  Tobacco Use: Medium Risk (01/28/2024)   SDOH Interventions: Food Insecurity Interventions: Intervention Not Indicated, Inpatient TOC Housing Interventions: Intervention Not Indicated, Inpatient TOC Transportation Interventions: Intervention Not Indicated, Inpatient TOC Utilities Interventions: Intervention Not Indicated, Inpatient TOC   Readmission Risk Interventions    01/29/2024    3:36 PM  Readmission Risk Prevention Plan  Transportation Screening Complete  Medication Review (RN Care Manager) Complete  PCP or Specialist appointment within 3-5 days of discharge Complete  HRI or Home Care Consult Complete  SW Recovery Care/Counseling Consult Complete  Palliative Care Screening Not Applicable  Skilled Nursing Facility Complete

## 2024-01-29 NOTE — Progress Notes (Addendum)
 [facility] PROGRESS NOTE   SANJIT MCMICHAEL FMW:[id number] DOB: 1945-12-13 DOA: 01/28/2024  PCP: Pcp, No  Brief History: 78 y.o. male with medical history significant for hypertension, dementia recent hospitalization for sepsis likely due to viral pneumonia now being admitted to the hospital with community-acquired pneumonia.  Patient has advanced dementia has a history of CVA he was not able to provide any history.  He just finished a course of Augmentin  that was prescribed at hospital discharge on 9/2.   Consultants: Palliative care  Procedures: None    Subjective/Interval History: Patient pleasantly confused.  Does not appear to be in any discomfort or distress.  He denies any pain.    Assessment/Plan:  Community-acquired pneumonia No sepsis noted at the time of admission. CT findings favored bronchopneumonia. Recent hospitalization for pneumonia noted.  Recently completed course of Augmentin . He was seen by speech therapy during previous admission but we will have them reevaluate the patient again considering his presentation. Patient is noted to be on antibiotics including ceftriaxone  and azithromycin . WBC is noted to be normal.  COVID-19 influenza and RSV PCR negative.  Strep pneumo urinary antigen negative. Noted to be afebrile.  Not requiring any oxygen currently.  History of dementia Noted to be on multiple mood stabilizing agents which will be continued.  History of stroke Continue Plavix  and statin.  BPH Continue finasteride  and Flomax .  Essential hypertension Noted to be on carvedilol .  Blood pressure is reasonably well-controlled.  Pulmonary nodule Incidentally noted on CT scan.  Will defer to outpatient providers.  Normocytic anemia Stable hemoglobin.  No evidence for any bleeding.  Goals of care This is patient's third hospitalization in the last 1 month.  Will request palliative care consult.   DVT Prophylaxis: Lovenox  Code Status: Full  code Family Communication: No family at bedside.  It appears that he is a ward of the state.  Legal guardians mentioned in the facesheet. Disposition Plan: SNF when medically stable  Status is: Inpatient Remains inpatient appropriate because: Pneumonia requiring IV antibiotics      Medications: Scheduled:  atorvastatin   40 mg Oral QPM   carvedilol   6.25 mg Oral BID WC   clopidogrel   75 mg Oral Daily   enoxaparin  (LOVENOX ) injection  40 mg Subcutaneous Daily   finasteride   5 mg Oral Daily   FLUoxetine   20 mg Oral Daily   QUEtiapine   50 mg Oral QHS   tamsulosin   0.4 mg Oral Daily   Continuous:  azithromycin  500 mg (01/29/24 0508)   cefTRIAXone  (ROCEPHIN )  IV 1 g (01/29/24 0428)   PRN:acetaminophen  **OR** acetaminophen , albuterol , ondansetron  **OR** ondansetron  (ZOFRAN ) IV, traZODone   Antibiotics: Anti-infectives (From admission, onward)    Start     Dose/Rate Route Frequency Ordered Stop   01/29/24 0600  azithromycin  (ZITHROMAX ) 500 mg in sodium chloride  0.9 % 250 mL IVPB        500 mg 250 mL/hr over 60 Minutes Intravenous Every 24 hours 01/28/24 0740     01/29/24 0500  cefTRIAXone  (ROCEPHIN ) 1 g in sodium chloride  0.9 % 100 mL IVPB        1 g 200 mL/hr over 30 Minutes Intravenous Every 24 hours 01/28/24 0740     01/28/24 0430  cefTRIAXone  (ROCEPHIN ) 1 g in sodium chloride  0.9 % 100 mL IVPB        1 g 200 mL/hr over 30 Minutes Intravenous  Once 01/28/24 0417 01/28/24 0550   01/28/24 0430  azithromycin  (ZITHROMAX ) 500 mg in sodium chloride  0.9 %  250 mL IVPB        500 mg 250 mL/hr over 60 Minutes Intravenous  Once 01/28/24 0417 01/28/24 0706       Objective:  Vital Signs  Vitals:   01/28/24 1547 01/28/24 1909 01/28/24 2258 01/29/24 0457  BP: (!) 150/63 (!) 144/62 (!) 116/58 (!) 143/67  Pulse: 62 60 (!) 57 (!) 53  Resp: 20 18 18 17   Temp: (!) 97.5 F (36.4 C) 97.9 F (36.6 C) 97.7 F (36.5 C) (!) 97.5 F (36.4 C)  TempSrc: Oral Oral Oral Oral  SpO2: 95% 95%  95% 95%    Intake/Output Summary (Last 24 hours) at 01/29/2024 0857 Last data filed at 01/29/2024 9347 Gross per 24 hour  Intake 120 ml  Output 1525 ml  Net -1405 ml   There were no vitals filed for this visit.  General appearance: Awake alert.  In no distress.  Distracted Resp: Tachypneic without use of accessory muscles.  Crackles at the bases.  No wheezing or rhonchi. Cardio: S1-S2 is normal regular.  No S3-S4.  No rubs murmurs or bruit GI: Abdomen is soft.  Nontender nondistended.  Bowel sounds are present normal.  No masses organomegaly Extremities: No edema.   Neurologic: He is noted to be disoriented.  No obvious focal neurological deficits noted.     Lab Results:  Data Reviewed: I have personally reviewed following labs and reports of the imaging studies  CBC: Recent Labs  Lab 01/28/24 0213 01/29/24 0516  WBC 9.7 8.3  HGB 11.0* 11.5*  HCT 37.1* 37.3*  MCV 90.3 89.9  PLT 313 287    Basic Metabolic Panel: Recent Labs  Lab 01/28/24 0213 01/29/24 0516  NA 141 141  K 4.4 4.1  CL 108 108  CO2 24 26  GLUCOSE 108* 103*  BUN 18 15  CREATININE 0.93 0.96  CALCIUM  9.2 9.1  MG 2.2  --     GFR: Estimated Creatinine Clearance: 54.3 mL/min (by C-G formula based on SCr of 0.96 mg/dL).   Recent Results (from the past 240 hours)  Resp panel by RT-PCR (RSV, Flu A&B, Covid) Anterior Nasal Swab     Status: None   Collection Time: 01/28/24  3:27 AM   Specimen: Anterior Nasal Swab  Result Value Ref Range Status   SARS Coronavirus 2 by RT PCR NEGATIVE NEGATIVE Final    Comment: (NOTE) SARS-CoV-2 target nucleic acids are NOT DETECTED.  The SARS-CoV-2 RNA is generally detectable in upper respiratory specimens during the acute phase of infection. The lowest concentration of SARS-CoV-2 viral copies this assay can detect is 138 copies/mL. A negative result does not preclude SARS-Cov-2 infection and should not be used as the sole basis for treatment or other patient  management decisions. A negative result may occur with  improper specimen collection/handling, submission of specimen other than nasopharyngeal swab, presence of viral mutation(s) within the areas targeted by this assay, and inadequate number of viral copies(<138 copies/mL). A negative result must be combined with clinical observations, patient history, and epidemiological information. The expected result is Negative.  Fact Sheet for Patients:  BloggerCourse.com  Fact Sheet for Healthcare Providers:  SeriousBroker.it  This test is no t yet approved or cleared by the [place]  FDA and  has been authorized for detection and/or diagnosis of SARS-CoV-2 by FDA under an Emergency Use Authorization (EUA). This EUA will remain  in effect (meaning this test can be used) for the duration of the COVID-19 declaration under Section 564(b)(1) of the Act,  21 U.S.C.section 360bbb-3(b)(1), unless the authorization is terminated  or revoked sooner.       Influenza A by PCR NEGATIVE NEGATIVE Final   Influenza B by PCR NEGATIVE NEGATIVE Final    Comment: (NOTE) The Xpert Xpress SARS-CoV-2/FLU/RSV plus assay is intended as an aid in the diagnosis of influenza from Nasopharyngeal swab specimens and should not be used as a sole basis for treatment. Nasal washings and aspirates are unacceptable for Xpert Xpress SARS-CoV-2/FLU/RSV testing.  Fact Sheet for Patients: BloggerCourse.com  Fact Sheet for Healthcare Providers: SeriousBroker.it  This test is not yet approved or cleared by the [place]  FDA and has been authorized for detection and/or diagnosis of SARS-CoV-2 by FDA under an Emergency Use Authorization (EUA). This EUA will remain in effect (meaning this test can be used) for the duration of the COVID-19 declaration under Section 564(b)(1) of the Act, 21 U.S.C. section 360bbb-3(b)(1),  unless the authorization is terminated or revoked.     Resp Syncytial Virus by PCR NEGATIVE NEGATIVE Final    Comment: (NOTE) Fact Sheet for Patients: BloggerCourse.com  Fact Sheet for Healthcare Providers: SeriousBroker.it  This test is not yet approved or cleared by the [place]  FDA and has been authorized for detection and/or diagnosis of SARS-CoV-2 by FDA under an Emergency Use Authorization (EUA). This EUA will remain in effect (meaning this test can be used) for the duration of the COVID-19 declaration under Section 564(b)(1) of the Act, 21 U.S.C. section 360bbb-3(b)(1), unless the authorization is terminated or revoked.  Performed at [facility], [address] [place], [place] [address]       Radiology Studies: CT Angio Chest PE W and/or Wo Contrast Result Date: 01/28/2024 EXAM: CTA of the Chest with contrast for PE 01/28/2024 03:41:00 AM TECHNIQUE: CTA of the chest was performed after the administration of intravenous contrast. Multiplanar reformatted images are provided for review. MIP images are provided for review. Automated exposure control, iterative reconstruction, and/or weight based adjustment of the mA/kV was utilized to reduce the radiation dose to as low as reasonably achievable. COMPARISON: Same day chest x-ray. CLINICAL HISTORY: Pulmonary embolism (PE) suspected, low to intermediate prob, positive D-dimer; SOB, ? nodule on x-ray, hypoxia. FINDINGS: PULMONARY ARTERIES: Pulmonary arteries are adequately opacified for evaluation. No pulmonary embolism. Main pulmonary artery is normal in caliber. MEDIASTINUM: The heart and pericardium demonstrate no acute abnormality. Coronary artery and aortic atherosclerotic calcification. Normal caliber thoracic aorta. LYMPH NODES: No mediastinal, hilar or axillary lymphadenopathy. LUNGS AND PLEURA: Clustering nodularity in the bilateral upper lobes. Diffuse  bronchial wall thickening with scattered mucus plugging. Scattered pulmonary nodules, for example, a 9 mm nodule in series 12, image 99, in the left lower lobe, 11 mm subpleural nodule in the anterior lingula on 04/23/21, 13 x 8 mm subpleural nodule in the anterior right upper lobe on series 12 image 70. These are favored to be infectious/inflammatory. There are patchy peribronchovascular ground-glass opacities bilaterally. No pleural effusion or pneumothorax. UPPER ABDOMEN: Limited images of the upper abdomen are unremarkable. SOFT TISSUES AND BONES: No acute bone or soft tissue abnormality. IMPRESSION: 1. No pulmonary embolism. 2. Findings favor bronchopneumonia. 3. Multiple solid pulmonary nodules >8 mm, favored related to #2. Follow-up CT in 3 months is recommended to ensure resolution. Electronically signed by: Norman Gatlin MD 01/28/2024 04:02 AM EDT RP Workstation: [id number]   DG Chest 2 View Result Date: 01/28/2024 EXAM: 2 VIEW(S) XRAY OF THE CHEST 01/28/2024 02:30:00 AM COMPARISON: 01/17/2024 CLINICAL HISTORY: shob. Shortness of breath  FINDINGS: LUNGS AND PLEURA: Streaky bibasilar opacities, likely atelectasis. Nodular opacity at right apex is favored secondary to degenerative change at the 1st costo- sternal junction. No pleural effusion. No pneumothorax. HEART AND MEDIASTINUM: No acute abnormality of the cardiac and mediastinal silhouettes. Atherosclerotic calcifications. BONES AND SOFT TISSUES: Mild degenerative changes of thoracic spine. No acute osseous abnormality. IMPRESSION: 1. Streaky bibasilar opacities, likely atelectasis. 2. Nodular opacity at right apex, favored secondary to degenerative change at the 1st costo-sternal junction. If desired, a pulmonary nodule could be excluded with non-emergent CT chest. Electronically signed by: Norman Gatlin MD 01/28/2024 02:42 AM EDT RP Workstation: [id number]       LOS: 1 day   Joette Pebbles  [facility] Pager on  [email or web address]  01/29/2024, 8:57 AM

## 2024-01-29 NOTE — Progress Notes (Signed)
 Update : [facility] called and gave me their number [phone]. His Current legal guardian is Larnell subbs [phone]

## 2024-01-30 DIAGNOSIS — Z7189 Other specified counseling: Secondary | ICD-10-CM

## 2024-01-30 DIAGNOSIS — J189 Pneumonia, unspecified organism: Secondary | ICD-10-CM | POA: Diagnosis not present

## 2024-01-30 DIAGNOSIS — F0390 Unspecified dementia without behavioral disturbance: Secondary | ICD-10-CM

## 2024-01-30 DIAGNOSIS — Z8673 Personal history of transient ischemic attack (TIA), and cerebral infarction without residual deficits: Secondary | ICD-10-CM

## 2024-01-30 DIAGNOSIS — Z515 Encounter for palliative care: Secondary | ICD-10-CM | POA: Diagnosis not present

## 2024-01-30 DIAGNOSIS — J180 Bronchopneumonia, unspecified organism: Secondary | ICD-10-CM | POA: Diagnosis not present

## 2024-01-30 LAB — LEGIONELLA PNEUMOPHILA SEROGP 1 UR AG: L. pneumophila Serogp 1 Ur Ag: NEGATIVE

## 2024-01-30 NOTE — Plan of Care (Signed)

## 2024-01-30 NOTE — Consult Note (Signed)
 Consultation Note Date: 01/30/2024   Patient Name: Dennis [name]  DOB: 1945/09/11  MRN: [id number]  Age / Sex: 78 y.o., male   PCP: Pcp, No Referring Physician: Verdene Purchase, MD  Reason for Consultation: Establishing goals of care     Chief Complaint/History of Present Illness:   Patient is a 78 year old male with a past medical history of hypertension, BPH, history of CVA, and dementia who was admitted on 01/28/2024 for management of pneumonia.  Patient had recently been admitted for sepsis secondary to viral pneumonia.  Patient had just finished course of Augmentin  that was prescribed to him at hospital discharge on 01/20/2024.  Patient currently receiving management for community-acquired pneumonia.  Palliative medicine team consulted to assist with complex medical decision making.  Reviewed EMR including recent documentation from hospitalist and TOC.  Also reviewed recent documentation from SLP and discussed patient's care with SLP directly.  As per [facility] documentation, patient is from [facility], memory care.  Patient is able to ambulate independently, is incontinent of bowel and bladder, is able to feed himself, and needs assist with bathing and cueing for dressing.  Patient has a legal guardian, [name], listed in the EMR. Reviewed recent BMP noting GFR estimated to be over 60.  Review of patient's CBC notes patient's white count is 8 3.  Presented to bedside to see patient.  Patient sitting up on side of the edge of the bed eating his meal.  Patient feeding himself easily.  Introduced myself as a member of the palliative medicine team.  Patient wondering why he is being kept in this room.  Discussed patient is in the hospital for management of pneumonia.  Patient notes that he does not like being in the hospital and would want to go home.  Patient states that he does not have any family or friends to assist in his care.  Discussed patient has a legal guardian in place.  Spent time  trying to reassure patient that care team is attempting to help him feel better.  Patient just notes overall frustration with having to be in the hospital.  Made attempts to call patient's legal guardian, [name], though there was no answer.  Discussed care with hospitalist and RN to coordinate care.  Primary Diagnoses  Present on Admission:  Bronchopneumonia    Past Medical History:  Diagnosis Date   Arthritis    Dementia (HCC)    Hypertension    Social History   Socioeconomic History   Marital status: Single    Spouse name: Not on file   Number of children: Not on file   Years of education: Not on file   Highest education level: Not on file  Occupational History   Not on file  Tobacco Use   Smoking status: Former    Types: Cigarettes   Smokeless tobacco: Never  Vaping Use   Vaping status: Never Used  Substance and Sexual Activity   Alcohol  use: No   Drug use: No   Sexual activity: Not on file  Other Topics Concern   Not on file  Social History Narrative   Not on file   Social Drivers of Health   Financial Resource Strain: Not on file  Food Insecurity: No Food Insecurity (01/29/2024)   Hunger Vital Sign    Worried About Running Out of Food in the Last Year: Never true    Ran Out of Food in the Last Year: Never true  Transportation Needs: No Transportation Needs (01/29/2024)  PRAPARE - Administrator, Civil Service (Medical): No    Lack of Transportation (Non-Medical): No  Physical Activity: Not on file  Stress: Not on file  Social Connections: Moderately Isolated (09/17/2023)   Social Connection and Isolation Panel    Frequency of Communication with Friends and Family: More than three times a week    Frequency of Social Gatherings with Friends and Family: More than three times a week    Attends Religious Services: More than 4 times per year    Active Member of [employer] or Organizations: No    Attends Banker Meetings: Never     Marital Status: Divorced   History reviewed. No pertinent family history. Scheduled Meds:  atorvastatin   40 mg Oral QPM   azithromycin   500 mg Oral Daily   carvedilol   6.25 mg Oral BID WC   clopidogrel   75 mg Oral Daily   enoxaparin  (LOVENOX ) injection  40 mg Subcutaneous Daily   finasteride   5 mg Oral Daily   FLUoxetine   20 mg Oral Daily   folic acid   1 mg Oral Daily   pantoprazole   40 mg Oral Daily   QUEtiapine   50 mg Oral QHS   senna-docusate  1 tablet Oral QHS   tamsulosin   0.4 mg Oral Daily   Continuous Infusions:  cefTRIAXone  (ROCEPHIN )  IV 1 g (01/30/24 0645)   PRN Meds:.acetaminophen  **OR** acetaminophen , albuterol , ondansetron  **OR** ondansetron  (ZOFRAN ) IV, polyethylene glycol, traZODone  Allergies  Allergen Reactions   Codeine Other (See Comments)    Unknown reaction   Shellfish Allergy Other (See Comments)    Unknown reaction   CBC:    Component Value Date/Time   WBC 8.3 01/29/2024 0516   HGB 11.5 (L) 01/29/2024 0516   HCT 37.3 (L) 01/29/2024 0516   PLT 287 01/29/2024 0516   MCV 89.9 01/29/2024 0516   NEUTROABS 8.4 (H) 01/17/2024 0052   LYMPHSABS 1.6 01/17/2024 0052   MONOABS 1.3 (H) 01/17/2024 0052   EOSABS 0.2 01/17/2024 0052   BASOSABS 0.0 01/17/2024 0052   Comprehensive Metabolic Panel:    Component Value Date/Time   NA 141 01/29/2024 0516   K 4.1 01/29/2024 0516   CL 108 01/29/2024 0516   CO2 26 01/29/2024 0516   BUN 15 01/29/2024 0516   CREATININE 0.96 01/29/2024 0516   GLUCOSE 103 (H) 01/29/2024 0516   CALCIUM  9.1 01/29/2024 0516   AST 20 01/17/2024 0052   ALT 13 01/17/2024 0052   ALKPHOS 37 (L) 01/17/2024 0052   BILITOT 0.3 01/17/2024 0052   PROT 6.5 01/17/2024 0052   ALBUMIN 3.3 (L) 01/20/2024 0353    Physical Exam: Vital Signs: BP (!) 148/77   Pulse 66   Temp 97.9 F (36.6 C) (Oral)   Resp 16   SpO2 96%  SpO2: SpO2: 96 % O2 Device: O2 Device: Room Air O2 Flow Rate:   Intake/output summary:  Intake/Output Summary (Last 24  hours) at 01/30/2024 0817 Last data filed at 01/30/2024 9480 Gross per 24 hour  Intake 120 ml  Output 500 ml  Net -380 ml   LBM:   Baseline Weight:   Most recent weight:    General: NAD, awake, feeding himself, able to participate in minimal conversation though becomes confused easily Cardiovascular: RRR Respiratory: no increased work of breathing noted, not in respiratory distress Neuro: Awake though easily confused  Psych: Frustrated  Palliative Performance Scale: 60%              Additional Data  Reviewed: Recent Labs    01/28/24 0213 01/29/24 0516  WBC 9.7 8.3  HGB 11.0* 11.5*  PLT 313 287  NA 141 141  BUN 18 15  CREATININE 0.93 0.96    Imaging: CT Angio Chest PE W and/or Wo Contrast EXAM: CTA of the Chest with contrast for PE 01/28/2024 03:41:00 AM  TECHNIQUE: CTA of the chest was performed after the administration of intravenous contrast. Multiplanar reformatted images are provided for review. MIP images are provided for review. Automated exposure control, iterative reconstruction, and/or weight based adjustment of the mA/kV was utilized to reduce the radiation dose to as low as reasonably achievable.  COMPARISON: Same day chest x-ray.  CLINICAL HISTORY: Pulmonary embolism (PE) suspected, low to intermediate prob, positive D-dimer; SOB, ? nodule on x-ray, hypoxia.  FINDINGS:  PULMONARY ARTERIES: Pulmonary arteries are adequately opacified for evaluation. No pulmonary embolism. Main pulmonary artery is normal in caliber.  MEDIASTINUM: The heart and pericardium demonstrate no acute abnormality. Coronary artery and aortic atherosclerotic calcification. Normal caliber thoracic aorta.  LYMPH NODES: No mediastinal, hilar or axillary lymphadenopathy.  LUNGS AND PLEURA: Clustering nodularity in the bilateral upper lobes. Diffuse bronchial wall thickening with scattered mucus plugging. Scattered pulmonary nodules, for example, a 9 mm nodule in series 12,  image 99, in the left lower lobe, 11 mm subpleural nodule in the anterior lingula on 04/23/21, 13 x 8 mm subpleural nodule in the anterior right upper lobe on series 12 image 70. These are favored to be infectious/inflammatory. There are patchy peribronchovascular ground-glass opacities bilaterally. No pleural effusion or pneumothorax.  UPPER ABDOMEN: Limited images of the upper abdomen are unremarkable.  SOFT TISSUES AND BONES: No acute bone or soft tissue abnormality.  IMPRESSION: 1. No pulmonary embolism. 2. Findings favor bronchopneumonia. 3. Multiple solid pulmonary nodules >8 mm, favored related to #2. Follow-up CT in 3 months is recommended to ensure resolution.  Electronically signed by: Norman Gatlin MD 01/28/2024 04:02 AM EDT RP Workstation: [id number] DG Chest 2 View EXAM: 2 VIEW(S) XRAY OF THE CHEST 01/28/2024 02:30:00 AM  COMPARISON: 01/17/2024  CLINICAL HISTORY: shob. Shortness of breath  FINDINGS:  LUNGS AND PLEURA: Streaky bibasilar opacities, likely atelectasis. Nodular opacity at right apex is favored secondary to degenerative change at the 1st costo- sternal junction. No pleural effusion. No pneumothorax.  HEART AND MEDIASTINUM: No acute abnormality of the cardiac and mediastinal silhouettes. Atherosclerotic calcifications.  BONES AND SOFT TISSUES: Mild degenerative changes of thoracic spine. No acute osseous abnormality.  IMPRESSION: 1. Streaky bibasilar opacities, likely atelectasis. 2. Nodular opacity at right apex, favored secondary to degenerative change at the 1st costo-sternal junction. If desired, a pulmonary nodule could be excluded with non-emergent CT chest.  Electronically signed by: Norman Gatlin MD 01/28/2024 02:42 AM EDT RP Workstation: [id number]    I personally reviewed recent imaging.   Palliative Care Assessment and Plan Summary of Established Goals of Care and Medical Treatment Preferences   Patient is a 78 year old  male with a past medical history of hypertension, BPH, history of CVA, and dementia who was admitted on 01/28/2024 for management of pneumonia.  Patient had recently been admitted for sepsis secondary to viral pneumonia.  Patient had just finished course of Augmentin  that was prescribed to him at hospital discharge on 01/20/2024.  Patient currently receiving management for community-acquired pneumonia.  Palliative medicine team consulted to assist with complex medical decision making.  # Complex medical decision making/goals of care  - Patient unable to participate in complex medical decision making due  to his medical status.  - Attempted to call patient's legal guardian, [name], who is listed in EMR without any answer.  Patient likely to return to memory care unit to continue management.  Would recommend consideration of change of CODE STATUS to DNR/DNI as if patient were sick enough that his heart were to stop or he would stop breathing in the setting of his multiple medical comorbidities including dementia, interventions such as cardiac resuscitation and intubation with mechanical ventilation would not lead to good quality of life outcomes for the patient.  Palliative medicine team continuing to follow along to engage in conversations as able and appropriate moving forward.  -  Code Status: Full Code   # Psycho-social/Spiritual Support:  - Support System: Legal guardianGLENWOOD [name]  # Discharge Planning:  To Be Determined  Thank you for allowing the palliative care team to participate in the care [name].  Tinnie Radar, DO Palliative Care Provider PMT # [phone]  If patient remains symptomatic despite maximum doses, please call PMT at [phone] between 0700 and 1900. Outside of these hours, please call attending, as PMT does not have night coverage.  Personally spent 60 minutes in patient care including extensive chart review (labs, imaging, progress/consult notes, vital signs),  medically appropraite exam, discussed with treatment team, education to patient, family, and staff, documenting clinical information, medication review and management, coordination of care, and available advanced directive documents.

## 2024-01-30 NOTE — Evaluation (Signed)
 Physical Therapy Evaluation Patient Details Name: Dennis [name] MRN: [id number] DOB: 1946/02/22 Today's Date: 01/30/2024  History of Present Illness  Pt is 78 yo male admitted with CAP on 01/28/24.  Pt with hx including but not limited to HTN, dementia with legal guardian  Clinical Impression  Pt admitted with above diagnosis.  He is ambulatory at baseline from memory care.  Today, pt pleasant and following basic commands.  He demonstrated safe transfers, ambulation, and use of RW.  Pt appears to be at baseline.  No acute PT needs.  Recommend ongoing mobility with nursing or mobility specialist.         If plan is discharge home, recommend the following: A little help with walking and/or transfers;A little help with bathing/dressing/bathroom;Assistance with cooking/housework;Help with stairs or ramp for entrance   Can travel by private vehicle        Equipment Recommendations None recommended by PT  Recommendations for Other Services       Functional Status Assessment Patient has not had a recent decline in their functional status     Precautions / Restrictions Precautions Precautions: Fall      Mobility  Bed Mobility Overal bed mobility: Independent                  Transfers Overall transfer level: Needs assistance Equipment used: Rolling walker (2 wheels) Transfers: Sit to/from Stand Sit to Stand: Supervision                Ambulation/Gait Ambulation/Gait assistance: Supervision Gait Distance (Feet): 400 Feet Assistive device: Rolling walker (2 wheels) Gait Pattern/deviations: Step-through pattern, WFL(Within Functional Limits) Gait velocity: normal     General Gait Details: Pt with good RW proximity, navigates tight area, stays close to walker with turns.  Demonstrated steady balance3  Stairs            Wheelchair Mobility     Tilt Bed    Modified Rankin (Stroke Patients Only)       Balance Overall balance assessment: Needs  assistance Sitting-balance support: No upper extremity supported Sitting balance-Leahy Scale: Good     Standing balance support: No upper extremity supported Standing balance-Leahy Scale: Good                               Pertinent Vitals/Pain      Home Living Family/patient expects to be discharged to:: Other (Comment)                   Additional Comments: Pt from memory care at [facility]    Prior Function Prior Level of Function : Patient poor historian/Family not available             Mobility Comments: Per chart ambulates independently; last PT note reports  uses RW ADLs Comments: Per TOC note: is incontinent of bowel and bladder;  able to feed himself; needs assist w/ bathing, and cues for dressing     Extremity/Trunk Assessment   Upper Extremity Assessment Upper Extremity Assessment: Overall WFL for tasks assessed    Lower Extremity Assessment Lower Extremity Assessment: Overall WFL for tasks assessed    Cervical / Trunk Assessment Cervical / Trunk Assessment: Normal  Communication        Cognition Arousal: Alert Behavior During Therapy: WFL for tasks assessed/performed   PT - Cognitive impairments: History of cognitive impairments  PT - Cognition Comments: Pleasant and agreeable Following commands: Impaired Following commands impaired: Only follows one step commands consistently     Cueing       General Comments General comments (skin integrity, edema, etc.): On RA with sats 93% ambulation    Exercises     Assessment/Plan    PT Assessment Patient does not need any further PT services  PT Problem List         PT Treatment Interventions      PT Goals (Current goals can be found in the Care Plan section)  Acute Rehab PT Goals Patient Stated Goal: not stated PT Goal Formulation: All assessment and education complete, DC therapy    Frequency       Co-evaluation                AM-PAC PT 6 Clicks Mobility  Outcome Measure Help needed turning from your back to your side while in a flat bed without using bedrails?: None Help needed moving from lying on your back to sitting on the side of a flat bed without using bedrails?: None Help needed moving to and from a bed to a chair (including a wheelchair)?: A Little Help needed standing up from a chair using your arms (e.g., wheelchair or bedside chair)?: A Little Help needed to walk in hospital room?: A Little Help needed climbing 3-5 steps with a railing? : A Little 6 Click Score: 20    End of Session Equipment Utilized During Treatment: Gait belt Activity Tolerance: Patient tolerated treatment well Patient left: in bed;with call bell/phone within reach;with bed alarm set Nurse Communication: Mobility status PT Visit Diagnosis: Other abnormalities of gait and mobility (R26.89)    Time: 8467-8448 PT Time Calculation (min) (ACUTE ONLY): 19 min   Charges:   PT Evaluation $PT Eval Low Complexity: 1 Low   PT General Charges $$ ACUTE PT VISIT: 1 Visit         Benjiman, PT Acute Rehab Services [facility] [phone]   Benjiman VEAR Mulberry 01/30/2024, 3:59 PM

## 2024-01-30 NOTE — [place] Notes (Signed)
 [facility] PROGRESS NOTE   Dennis [name] FMW:[id number] DOB: 01/24/1946 DOA: 01/28/2024  PCP: Pcp, No  Brief History: 78 y.o. male with medical history significant for hypertension, dementia recent hospitalization for sepsis likely due to viral pneumonia now being admitted to the hospital with community-acquired pneumonia.  Patient has advanced dementia has a history of CVA he was not able to provide any history.  He just finished a course of Augmentin  that was prescribed at hospital discharge on 9/2.   Consultants: Palliative care  Procedures: None    Subjective/Interval History: Patient remains pleasantly confused but noted to be less distracted.  Denies any complaints.  Denies any shortness of breath.  Does not appear to be in any discomfort.    Assessment/Plan:  Community-acquired pneumonia No sepsis noted at the time of admission. CT findings favored bronchopneumonia. Recent hospitalization for pneumonia noted.  Recently completed course of Augmentin . Has been seen by speech therapy.  Remains on a regular diet. COVID-19 influenza and RSV PCR negative.  Strep pneumo urinary antigen negative. Noted to be afebrile.  WBC is normal.   Continue ceftriaxone  and azithromycin .   Not requiring any oxygen. Start mobilizing.  History of dementia Noted to be on multiple mood stabilizing agents which will be continued.  Mental status seems to be stable.  History of stroke Continue Plavix  and statin.  BPH Continue finasteride  and Flomax .  Essential hypertension Noted to be on carvedilol .  Blood pressure is reasonably well-controlled.  Pulmonary nodule Incidentally noted on CT scan.  Will defer to outpatient providers.  Normocytic anemia Stable hemoglobin.  No evidence for any bleeding.  Goals of care This is patient's third hospitalization in the last 1 month.  Palliative care consulted.   DVT Prophylaxis: Lovenox  Code Status: Full code Family Communication:  No family at bedside.  It appears that he is a ward of the state.  Legal guardians mentioned in the facesheet. Disposition Plan: SNF when medically stable     Medications: Scheduled:  atorvastatin   40 mg Oral QPM   azithromycin   500 mg Oral Daily   carvedilol   6.25 mg Oral BID WC   clopidogrel   75 mg Oral Daily   enoxaparin  (LOVENOX ) injection  40 mg Subcutaneous Daily   finasteride   5 mg Oral Daily   FLUoxetine   20 mg Oral Daily   folic acid   1 mg Oral Daily   pantoprazole   40 mg Oral Daily   QUEtiapine   50 mg Oral QHS   senna-docusate  1 tablet Oral QHS   tamsulosin   0.4 mg Oral Daily   Continuous:  cefTRIAXone  (ROCEPHIN )  IV 1 g (01/30/24 0645)   PRN:acetaminophen  **OR** acetaminophen , albuterol , ondansetron  **OR** ondansetron  (ZOFRAN ) IV, polyethylene glycol, traZODone   Antibiotics: Anti-infectives (From admission, onward)    Start     Dose/Rate Route Frequency Ordered Stop   01/30/24 1000  azithromycin  (ZITHROMAX ) tablet 500 mg        500 mg Oral Daily 01/29/24 1202     01/29/24 0600  azithromycin  (ZITHROMAX ) 500 mg in sodium chloride  0.9 % 250 mL IVPB  Status:  Discontinued        500 mg 250 mL/hr over 60 Minutes Intravenous Every 24 hours 01/28/24 0740 01/29/24 1202   01/29/24 0500  cefTRIAXone  (ROCEPHIN ) 1 g in sodium chloride  0.9 % 100 mL IVPB        1 g 200 mL/hr over 30 Minutes Intravenous Every 24 hours 01/28/24 0740     01/28/24 0430  cefTRIAXone  (ROCEPHIN )  1 g in sodium chloride  0.9 % 100 mL IVPB        1 g 200 mL/hr over 30 Minutes Intravenous  Once 01/28/24 0417 01/28/24 0550   01/28/24 0430  azithromycin  (ZITHROMAX ) 500 mg in sodium chloride  0.9 % 250 mL IVPB        500 mg 250 mL/hr over 60 Minutes Intravenous  Once 01/28/24 0417 01/28/24 0706       Objective:  Vital Signs  Vitals:   01/29/24 0457 01/29/24 1956 01/29/24 2113 01/30/24 0524  BP: (!) 143/67 (!) 163/69 (!) 155/66 (!) 148/77  Pulse: (!) 53 66    Resp: 17 16    Temp: (!) 97.5 F  (36.4 C) 97.9 F (36.6 C)    TempSrc: Oral Oral    SpO2: 95% 96%      Intake/Output Summary (Last 24 hours) at 01/30/2024 0905 Last data filed at 01/30/2024 0519 Gross per 24 hour  Intake 120 ml  Output 500 ml  Net -380 ml   General appearance: Awake alert.  In no distress.  Mildly distracted Resp: Clear to auscultation bilaterally.  Normal effort Cardio: S1-S2 is normal regular.  No S3-S4.  No rubs murmurs or bruit GI: Abdomen is soft.  Nontender nondistended.  Bowel sounds are present normal.  No masses organomegaly   Lab Results:  Data Reviewed: I have personally reviewed following labs and reports of the imaging studies  CBC: Recent Labs  Lab 01/28/24 0213 01/29/24 0516  WBC 9.7 8.3  HGB 11.0* 11.5*  HCT 37.1* 37.3*  MCV 90.3 89.9  PLT 313 287    Basic Metabolic Panel: Recent Labs  Lab 01/28/24 0213 01/29/24 0516  NA 141 141  K 4.4 4.1  CL 108 108  CO2 24 26  GLUCOSE 108* 103*  BUN 18 15  CREATININE 0.93 0.96  CALCIUM  9.2 9.1  MG 2.2  --     GFR: Estimated Creatinine Clearance: 54.3 mL/min (by C-G formula based on SCr of 0.96 mg/dL).   Recent Results (from the past 240 hours)  Resp panel by RT-PCR (RSV, Flu A&B, Covid) Anterior Nasal Swab     Status: None   Collection Time: 01/28/24  3:27 AM   Specimen: Anterior Nasal Swab  Result Value Ref Range Status   SARS Coronavirus 2 by RT PCR NEGATIVE NEGATIVE Final    Comment: (NOTE) SARS-CoV-2 target nucleic acids are NOT DETECTED.  The SARS-CoV-2 RNA is generally detectable in upper respiratory specimens during the acute phase of infection. The lowest concentration of SARS-CoV-2 viral copies this assay can detect is 138 copies/mL. A negative result does not preclude SARS-Cov-2 infection and should not be used as the sole basis for treatment or other patient management decisions. A negative result may occur with  improper specimen collection/handling, submission of specimen other than nasopharyngeal  swab, presence of viral mutation(s) within the areas targeted by this assay, and inadequate number of viral copies(<138 copies/mL). A negative result must be combined with clinical observations, patient history, and epidemiological information. The expected result is Negative.  Fact Sheet for Patients:  BloggerCourse.com  Fact Sheet for Healthcare Providers:  SeriousBroker.it  This test is no t yet approved or cleared by the [place]  FDA and  has been authorized for detection and/or diagnosis of SARS-CoV-2 by FDA under an Emergency Use Authorization (EUA). This EUA will remain  in effect (meaning this test can be used) for the duration of the COVID-19 declaration under Section 564(b)(1) of the Act, 21  U.S.C.section 360bbb-3(b)(1), unless the authorization is terminated  or revoked sooner.       Influenza A by PCR NEGATIVE NEGATIVE Final   Influenza B by PCR NEGATIVE NEGATIVE Final    Comment: (NOTE) The Xpert Xpress SARS-CoV-2/FLU/RSV plus assay is intended as an aid in the diagnosis of influenza from Nasopharyngeal swab specimens and should not be used as a sole basis for treatment. Nasal washings and aspirates are unacceptable for Xpert Xpress SARS-CoV-2/FLU/RSV testing.  Fact Sheet for Patients: BloggerCourse.com  Fact Sheet for Healthcare Providers: SeriousBroker.it  This test is not yet approved or cleared by the [place]  FDA and has been authorized for detection and/or diagnosis of SARS-CoV-2 by FDA under an Emergency Use Authorization (EUA). This EUA will remain in effect (meaning this test can be used) for the duration of the COVID-19 declaration under Section 564(b)(1) of the Act, 21 U.S.C. section 360bbb-3(b)(1), unless the authorization is terminated or revoked.     Resp Syncytial Virus by PCR NEGATIVE NEGATIVE Final    Comment: (NOTE) Fact Sheet for  Patients: BloggerCourse.com  Fact Sheet for Healthcare Providers: SeriousBroker.it  This test is not yet approved or cleared by the [place]  FDA and has been authorized for detection and/or diagnosis of SARS-CoV-2 by FDA under an Emergency Use Authorization (EUA). This EUA will remain in effect (meaning this test can be used) for the duration of the COVID-19 declaration under Section 564(b)(1) of the Act, 21 U.S.C. section 360bbb-3(b)(1), unless the authorization is terminated or revoked.  Performed at [facility], [address] [place], [place] [address]       Radiology Studies: No results found.      LOS: 2 days   Taijon Vink Verdene  [facility] Pager on [email or web address]  01/30/2024, 9:05 AM

## 2024-01-31 DIAGNOSIS — F0390 Unspecified dementia without behavioral disturbance: Secondary | ICD-10-CM | POA: Diagnosis not present

## 2024-01-31 DIAGNOSIS — J189 Pneumonia, unspecified organism: Secondary | ICD-10-CM | POA: Diagnosis not present

## 2024-01-31 DIAGNOSIS — J180 Bronchopneumonia, unspecified organism: Secondary | ICD-10-CM | POA: Diagnosis not present

## 2024-01-31 DIAGNOSIS — Z515 Encounter for palliative care: Secondary | ICD-10-CM | POA: Diagnosis not present

## 2024-01-31 DIAGNOSIS — Z8673 Personal history of transient ischemic attack (TIA), and cerebral infarction without residual deficits: Secondary | ICD-10-CM | POA: Diagnosis not present

## 2024-01-31 NOTE — [place] Notes (Signed)
  Daily [place] Note   Patient Name: Dennis [name]       Date: 01/31/2024 DOB: 06-23-1945  Age: 78 y.o. MRN#: [id number] Attending Physician: Verdene Purchase, MD Primary Care Physician: Pcp, No Admit Date: 01/28/2024 Length of Stay: 3 days  Reason for Consultation/Follow-up: Establishing goals of care  Subjective:   Reviewed recent documentation from hospitalist.  Presented to bedside to see patient.  Patient laying comfortably in bed.  Remains pleasantly confused.  Able to participate with feeding himself. Unable to reach patient's guardian, [name], via phone to discuss care planning.  Patient slowly improving with hospitalization.  Likely plan for patient to return to memory care unit at time of discharge.  Objective:   Vital Signs:  BP (!) 160/69 (BP Location: Left Arm)   Pulse 65   Temp (!) 97.4 F (36.3 C)   Resp 16   SpO2 94%   Physical Exam: General: NAD, awake, feeding himself  Cardiovascular: RRR Respiratory: no increased work of breathing noted, not in respiratory distress Neuro: Awake though easily confused  Psych: Easily frustrated   Assessment & Plan:   Assessment: Patient is a 78 year old male with a past medical history of hypertension, BPH, history of CVA, and dementia who was admitted on 01/28/2024 for management of pneumonia.  Patient had recently been admitted for sepsis secondary to viral pneumonia.  Patient had just finished course of Augmentin  that was prescribed to him at hospital discharge on 01/20/2024.  Patient currently receiving management for community-acquired pneumonia.  Palliative medicine team consulted to assist with complex medical decision making.   Recommendations/Plan: # Complex medical decision making/goals of care:  - Patient unable to participate in complex medical decision making due to his medical status.                - Have attempted to call patient's legal guardian, [name], who is listed in EMR without any answer.  Patient  likely to return to memory care unit to continue management.  Would recommend consideration of change of CODE STATUS to DNR/DNI as if patient were sick enough that his heart were to stop or he would stop breathing in the setting of his multiple medical comorbidities including dementia, interventions such as cardiac resuscitation and intubation with mechanical ventilation would not lead to good quality of life outcomes for the patient.                  -  Code Status: Full Code    # Psycho-social/Spiritual Support:  - Support System: Legal guardian- [name]  # Discharge Planning: Return to memory care facility    Thank you for allowing the palliative care team to participate in the care [name].  Tinnie Radar, DO Palliative Care Provider PMT # [phone]  If patient remains symptomatic despite maximum doses, please call PMT at [phone] between 0700 and 1900. Outside of these hours, please call attending, as PMT does not have night coverage.

## 2024-01-31 NOTE — [place] Notes (Signed)
 [facility] PROGRESS NOTE   Dennis [name] FMW:[id number] DOB: [birth year] DOA: 01/28/2024  PCP: Pcp, No  Brief History: 78 y.o. male with medical history significant for hypertension, dementia recent hospitalization for sepsis likely due to viral pneumonia now being admitted to the hospital with community-acquired pneumonia.  Patient has advanced dementia has a history of CVA he was not able to provide any history.  He just finished a course of Augmentin  that was prescribed at hospital discharge on 9/2.   Consultants: Palliative care  Procedures: None    Subjective/Interval History: Patient remains pleasantly confused.  However this has improved in the last 48 hours.  Denies any shortness of breath or cough.     Assessment/Plan:  Community-acquired pneumonia No sepsis noted at the time of admission. CT findings favored bronchopneumonia. Recent hospitalization for pneumonia noted.  Recently completed course of Augmentin . Has been seen by speech therapy.  Remains on a regular diet. COVID-19 influenza and RSV PCR negative.  Strep pneumo urinary antigen negative. Patient seems to have improved with treatment.  He remains on ceftriaxone  and azithromycin .  Not requiring any oxygen.  He is afebrile.  WBC has been normal. Leave him on ceftriaxone  and azithromycin  through the weekend and then will we will transition him to Omnicef at discharge.  He previously completed a course of Augmentin .  History of dementia Noted to be on multiple mood stabilizing agents which will be continued.  Mental status seems to be stable.  History of stroke Continue Plavix  and statin.  BPH Continue finasteride  and Flomax .  Essential hypertension Noted to be on carvedilol .  Blood pressure is reasonably well-controlled.  Pulmonary nodule Incidentally noted on CT scan.  Will defer to outpatient providers.  Normocytic anemia Stable hemoglobin.  No evidence for any bleeding.  Goals of care This  is patient's third hospitalization in the last 1 month.  Palliative care was consulted.  They have been unable to reach patient's legal guardians.  Patient is not a candidate for hospice yet.   DVT Prophylaxis: Lovenox  Code Status: Full code Family Communication: No family at bedside.  It appears that he is a ward of the state.  Legal guardians mentioned in the facesheet. Disposition Plan: SNF when medically stable     Medications: Scheduled:  atorvastatin   40 mg Oral QPM   azithromycin   500 mg Oral Daily   carvedilol   6.25 mg Oral BID WC   clopidogrel   75 mg Oral Daily   enoxaparin  (LOVENOX ) injection  40 mg Subcutaneous Daily   finasteride   5 mg Oral Daily   FLUoxetine   20 mg Oral Daily   folic acid   1 mg Oral Daily   pantoprazole   40 mg Oral Daily   QUEtiapine   50 mg Oral QHS   senna-docusate  1 tablet Oral QHS   tamsulosin   0.4 mg Oral Daily   Continuous:  cefTRIAXone  (ROCEPHIN )  IV 1 g (01/31/24 0454)   PRN:acetaminophen  **OR** acetaminophen , albuterol , ondansetron  **OR** ondansetron  (ZOFRAN ) IV, polyethylene glycol, traZODone   Antibiotics: Anti-infectives (From admission, onward)    Start     Dose/Rate Route Frequency Ordered Stop   01/30/24 1000  azithromycin  (ZITHROMAX ) tablet 500 mg        500 mg Oral Daily 01/29/24 1202     01/29/24 0600  azithromycin  (ZITHROMAX ) 500 mg in sodium chloride  0.9 % 250 mL IVPB  Status:  Discontinued        500 mg 250 mL/hr over 60 Minutes Intravenous Every 24 hours 01/28/24 0740  01/29/24 1202   01/29/24 0500  cefTRIAXone  (ROCEPHIN ) 1 g in sodium chloride  0.9 % 100 mL IVPB        1 g 200 mL/hr over 30 Minutes Intravenous Every 24 hours 01/28/24 0740     01/28/24 0430  cefTRIAXone  (ROCEPHIN ) 1 g in sodium chloride  0.9 % 100 mL IVPB        1 g 200 mL/hr over 30 Minutes Intravenous  Once 01/28/24 0417 01/28/24 0550   01/28/24 0430  azithromycin  (ZITHROMAX ) 500 mg in sodium chloride  0.9 % 250 mL IVPB        500 mg 250 mL/hr over 60  Minutes Intravenous  Once 01/28/24 0417 01/28/24 0706       Objective:  Vital Signs  Vitals:   01/30/24 1450 01/30/24 2237 01/31/24 0630 01/31/24 0825  BP: (!) 145/64 (!) 155/76 (!) 160/69 136/66  Pulse: 64 72 65 63  Resp: 18 16 16    Temp:  98 F (36.7 C) (!) 97.4 F (36.3 C) 97.9 F (36.6 C)  TempSrc:      SpO2: 95% 93% 94% 93%    Intake/Output Summary (Last 24 hours) at 01/31/2024 0941 Last data filed at 01/31/2024 9178 Gross per 24 hour  Intake 300 ml  Output 2900 ml  Net -2600 ml   General appearance: Awake alert.  In no distress Resp: Clear to auscultation bilaterally.  Normal effort Cardio: S1-S2 is normal regular.  No S3-S4.  No rubs murmurs or bruit GI: Abdomen is soft.  Nontender nondistended.  Bowel sounds are present normal.  No masses organomegaly   Lab Results:  Data Reviewed: I have personally reviewed following labs and reports of the imaging studies  CBC: Recent Labs  Lab 01/28/24 0213 01/29/24 0516  WBC 9.7 8.3  HGB 11.0* 11.5*  HCT 37.1* 37.3*  MCV 90.3 89.9  PLT 313 287    Basic Metabolic Panel: Recent Labs  Lab 01/28/24 0213 01/29/24 0516  NA 141 141  K 4.4 4.1  CL 108 108  CO2 24 26  GLUCOSE 108* 103*  BUN 18 15  CREATININE 0.93 0.96  CALCIUM  9.2 9.1  MG 2.2  --     GFR: Estimated Creatinine Clearance: 54.3 mL/min (by C-G formula based on SCr of 0.96 mg/dL).   Recent Results (from the past 240 hours)  Resp panel by RT-PCR (RSV, Flu A&B, Covid) Anterior Nasal Swab     Status: None   Collection Time: 01/28/24  3:27 AM   Specimen: Anterior Nasal Swab  Result Value Ref Range Status   SARS Coronavirus 2 by RT PCR NEGATIVE NEGATIVE Final    Comment: (NOTE) SARS-CoV-2 target nucleic acids are NOT DETECTED.  The SARS-CoV-2 RNA is generally detectable in upper respiratory specimens during the acute phase of infection. The lowest concentration of SARS-CoV-2 viral copies this assay can detect is 138 copies/mL. A negative  result does not preclude SARS-Cov-2 infection and should not be used as the sole basis for treatment or other patient management decisions. A negative result may occur with  improper specimen collection/handling, submission of specimen other than nasopharyngeal swab, presence of viral mutation(s) within the areas targeted by this assay, and inadequate number of viral copies(<138 copies/mL). A negative result must be combined with clinical observations, patient history, and epidemiological information. The expected result is Negative.  Fact Sheet for Patients:  BloggerCourse.com  Fact Sheet for Healthcare Providers:  SeriousBroker.it  This test is no t yet approved or cleared by the [place]  FDA  and  has been authorized for detection and/or diagnosis of SARS-CoV-2 by FDA under an Emergency Use Authorization (EUA). This EUA will remain  in effect (meaning this test can be used) for the duration of the COVID-19 declaration under Section 564(b)(1) of the Act, 21 U.S.C.section 360bbb-3(b)(1), unless the authorization is terminated  or revoked sooner.       Influenza A by PCR NEGATIVE NEGATIVE Final   Influenza B by PCR NEGATIVE NEGATIVE Final    Comment: (NOTE) The Xpert Xpress SARS-CoV-2/FLU/RSV plus assay is intended as an aid in the diagnosis of influenza from Nasopharyngeal swab specimens and should not be used as a sole basis for treatment. Nasal washings and aspirates are unacceptable for Xpert Xpress SARS-CoV-2/FLU/RSV testing.  Fact Sheet for Patients: BloggerCourse.com  Fact Sheet for Healthcare Providers: SeriousBroker.it  This test is not yet approved or cleared by the [place]  FDA and has been authorized for detection and/or diagnosis of SARS-CoV-2 by FDA under an Emergency Use Authorization (EUA). This EUA will remain in effect (meaning this test can be used)  for the duration of the COVID-19 declaration under Section 564(b)(1) of the Act, 21 U.S.C. section 360bbb-3(b)(1), unless the authorization is terminated or revoked.     Resp Syncytial Virus by PCR NEGATIVE NEGATIVE Final    Comment: (NOTE) Fact Sheet for Patients: BloggerCourse.com  Fact Sheet for Healthcare Providers: SeriousBroker.it  This test is not yet approved or cleared by the [place]  FDA and has been authorized for detection and/or diagnosis of SARS-CoV-2 by FDA under an Emergency Use Authorization (EUA). This EUA will remain in effect (meaning this test can be used) for the duration of the COVID-19 declaration under Section 564(b)(1) of the Act, 21 U.S.C. section 360bbb-3(b)(1), unless the authorization is terminated or revoked.  Performed at [facility], [address] [place], [place] [address]       Radiology Studies: No results found.      LOS: 3 days   Lorelie Biermann [employer] on [email or web address]  01/31/2024, 9:41 AM

## 2024-01-31 NOTE — Plan of Care (Signed)
   Problem: Clinical Measurements: Goal: Will remain free from infection Outcome: [place]   Problem: Nutrition: Goal: Adequate nutrition will be maintained Outcome: [place]   Problem: Safety: Goal: Ability to remain free from injury will improve Outcome: [place]

## 2024-01-31 NOTE — [place] Notes (Signed)
 OT Cancellation Note  Patient Details Name: Dennis [name] MRN: [id number] DOB: [birth year]   Cancelled Treatment:    Reason Eval/Treat Not Completed: Fatigue/lethargy limiting ability to participate Pt sleeping soundly - will check back on pt next day.   [name] 01/31/2024, 1:36 PM

## 2024-02-01 DIAGNOSIS — Z7189 Other specified counseling: Secondary | ICD-10-CM | POA: Diagnosis not present

## 2024-02-01 DIAGNOSIS — F0390 Unspecified dementia without behavioral disturbance: Secondary | ICD-10-CM | POA: Diagnosis not present

## 2024-02-01 DIAGNOSIS — J180 Bronchopneumonia, unspecified organism: Secondary | ICD-10-CM | POA: Diagnosis not present

## 2024-02-01 DIAGNOSIS — Z515 Encounter for palliative care: Secondary | ICD-10-CM | POA: Diagnosis not present

## 2024-02-01 LAB — CBC
HCT: 40.8 % (ref 39.0–52.0)
Hemoglobin: 12.3 g/dL — ABNORMAL LOW (ref 13.0–17.0)
MCH: 27.4 pg (ref 26.0–34.0)
MCHC: 30.1 g/dL (ref 30.0–36.0)
MCV: 90.9 fL (ref 80.0–100.0)
Platelets: 314 K/uL (ref 150–400)
RBC: 4.49 MIL/uL (ref 4.22–5.81)
RDW: 15.3 % (ref 11.5–15.5)
WBC: 10.3 K/uL (ref 4.0–10.5)
nRBC: 0 % (ref 0.0–0.2)

## 2024-02-01 LAB — BASIC METABOLIC PANEL WITH GFR
Anion gap: 11 (ref 5–15)
BUN: 21 mg/dL (ref 8–23)
CO2: 24 mmol/L (ref 22–32)
Calcium: 9.8 mg/dL (ref 8.9–10.3)
Chloride: 106 mmol/L (ref 98–111)
Creatinine, Ser: 0.93 mg/dL (ref 0.61–1.24)
GFR, Estimated: >60 mL/min (ref >60)
Glucose, Bld: 89 mg/dL (ref 70–99)
Potassium: 4.5 mmol/L (ref 3.5–5.1)
Sodium: 140 mmol/L (ref 135–145)

## 2024-02-01 LAB — PHOSPHORUS: Phosphorus: 3.2 mg/dL (ref 2.5–4.6)

## 2024-02-01 LAB — MAGNESIUM: Magnesium: 2.1 mg/dL (ref 1.7–2.4)

## 2024-02-01 MED ORDER — SENNOSIDES-DOCUSATE SODIUM 8.6-50 MG PO TABS
2.0000 | ORAL_TABLET | Freq: Two times a day (BID) | ORAL | Status: DC
  Administered 2024-02-01 – 2024-02-02 (×3): 2 via ORAL
  Filled 2024-02-01 (×3): qty 2

## 2024-02-01 MED ORDER — INFLUENZA VAC SPLIT HIGH-DOSE 0.5 ML IM SUSY
0.5000 mL | PREFILLED_SYRINGE | INTRAMUSCULAR | Status: AC
  Administered 2024-02-02: 0.5 mL via INTRAMUSCULAR
  Filled 2024-02-01: qty 0.5

## 2024-02-01 MED ORDER — POLYETHYLENE GLYCOL 3350 17 G PO PACK
17.0000 g | PACK | Freq: Every day | ORAL | Status: DC
  Administered 2024-02-01 – 2024-02-02 (×2): 17 g via ORAL
  Filled 2024-02-01 (×2): qty 1

## 2024-02-01 NOTE — Progress Notes (Signed)
 Palliative Medicine Inpatient Follow Up Note HPI: Patient is a 78 year old male with a past medical history of hypertension, BPH, history of CVA, and dementia who was admitted on 01/28/2024 for management of pneumonia. Patient had recently been admitted for sepsis secondary to viral pneumonia. Patient had just finished course of Augmentin  that was prescribed to him at hospital discharge on 01/20/2024. Patient currently receiving management for community-acquired pneumonia. Palliative medicine team consulted to assist with complex medical decision making.   Today's Discussion 02/01/2024  *Please note that this is a verbal dictation therefore any spelling or grammatical errors are due to the Dragon Medical One system interpretation.  Chart reviewed inclusive of vital signs, progress notes, laboratory results, and diagnostic images.   I met with Dennis [name] at bedside this afternoon. He is awake and aware of self. We discussed that he was hospitalized in the setting of a pneumonia. He shares that as of presently he is not experiencing shortness of breath. He shares that he is not nauseated. He expresses the desire to get out of bed and is able to show me his ability to lift his feet to gravity.   Dennis [name] is disoriented at time with our conversation. He tended to be a bit tangential when talking about Dennis [name]. He emphasized that he does not like the environment he is in and feels imprisoned.   I called and spoke with patients former legal guardian, Dennis [name]. Dennis [name] shares that he knows the patient and is going to take the information provided today and pass it along to this supervisor. Dennis [name] shares that he is no longer the guardian for [name] though he had been prior. He notes the person who was assigned to him is now on leave therefore he is going to reach out to his supervisors to see who is filling in.   We reviewed Dennis [name]'s five hospitalizations and two ER visits. We discussed how each hospital  stay has a profound effect on geriatric dementia patients mental, physical, and emotional state. I shared my concerns associated with Dennis [name]'s current state and what to anticipate moving into the future.   I was able to echo my colleagues concerns regarding Dennis [name]'s current condition. I shared he would not fair well if he went into a cardiac arrest based upon his multiple medical comorbidities including dementia, HTN, arthritis. We discussed that  interventions such as cardiac resuscitation and intubation with mechanical ventilation would not lead to good quality of life outcomes for Dennis [name] and may actually enhance a degree of suffering. Dennis [name] plans to send the information regarding two physician DNR to the PMT.  We discussed ongoing conversation(s) pending patients improvements or declines.   Questions and concerns addressed/Palliative Support Provided.   Objective Assessment: Vital Signs Vitals:   02/01/24 0940 02/01/24 1219  BP: (!) 141/67 (!) 129/58  Pulse:  73  Resp: 18 20  Temp:  97.6 F (36.4 C)  SpO2: 95% 94%    Intake/Output Summary (Last 24 hours) at 02/01/2024 1536 Last data filed at 02/01/2024 0800 Gross per 24 hour  Intake 960 ml  Output 2100 ml  Net -1140 ml   Gen:  Elderly Caucasian M chronically ill HEENT: moist mucous membranes CV: Regular rate and rhythm  PULM: On RA, breathing is even and nonlabored ABD: soft/nontender  EXT: No edema  Neuro: Alert and oriented to person  SUMMARY OF RECOMMENDATIONS   Full code at this time --> Have endorsed the likely poor outcomes should Dennis undergo CPR/Intubation.  Have  spoken to patients former legal guardian, Dennis [name] --> Dennis [name] now has a new guardian though they are on leave therefore Dennis is escalating his case to their supervisor to verify who is the interim coverage. For today (Sunday) Dennis [name] is willing to support decisions. He plans to email a copy of the two physician DNR form.   Dennis [name] is in agreement with OP Palliative  support.  The PMT will continue to follow along and support Dennis. ______________________________________________________________________________________ Dennis [name] [name] Palliative Medicine Team Team Cell Phone: [phone] Please utilize secure chat with additional questions, if there is no response within 30 minutes please call the above phone number  Time: 11 Billing based on MDM: High  Palliative Medicine Team providers are available by phone from 7am to 7pm daily and can be reached through the team cell phone.  Should this patient require assistance outside of these hours, please call the patient's attending physician.

## 2024-02-01 NOTE — Progress Notes (Signed)
 [facility] PROGRESS NOTE   Dennis [name] FMW:[id number] DOB: [birth year] DOA: 01/28/2024  PCP: Pcp, No  Brief History: 78 y.o. male with medical history significant for hypertension, dementia recent hospitalization for sepsis likely due to viral pneumonia now being admitted to the hospital with community-acquired pneumonia.  Patient has advanced dementia has a history of CVA he was not able to provide any history.  He just finished a course of Augmentin  that was prescribed at hospital discharge on 9/2.   Consultants: Palliative care  Procedures: None    Subjective/Interval History: Patient remains pleasantly confused.  No new issues noted.  Denies any complaints this morning.     Assessment/Plan:  Community-acquired pneumonia No sepsis noted at the time of admission. CT findings favored bronchopneumonia. Recent hospitalization for pneumonia noted.  Recently completed course of Augmentin . Has been seen by speech therapy.  Remains on a regular diet. COVID-19 influenza and RSV PCR negative.  Strep pneumo urinary antigen negative. Patient seems to have improved with treatment.  He remains on ceftriaxone  and azithromycin .  Not requiring any oxygen.  He is afebrile.  WBC has been normal. Leave him on ceftriaxone  and azithromycin  through the weekend and then will we will transition him to Omnicef at discharge.  He previously completed a course of Augmentin . Respiratory status is stable.  History of dementia Noted to be on multiple mood stabilizing agents which will be continued.  Mental status seems to be stable.  History of stroke Continue Plavix  and statin.  BPH Continue finasteride  and Flomax .  Essential hypertension Noted to be on carvedilol .  Blood pressure is reasonably well-controlled.  Pulmonary nodule Incidentally noted on CT scan.  Will defer to outpatient providers.  Normocytic anemia Stable hemoglobin.  No evidence for any bleeding.  Goals of care This  is patient's third hospitalization in the last 1 month.  Palliative care was consulted.  They have been unable to reach patient's legal guardians.  Patient is not a candidate for hospice yet. Will recommend palliative follow the patient at his facility.  CODE STATUS can be addressed in the outpatient setting.   DVT Prophylaxis: Lovenox  Code Status: Full code Family Communication: No family at bedside.  It appears that he is a ward of the state.  Legal guardians mentioned in the facesheet. Disposition Plan: SNF when medically stable     Medications: Scheduled:  atorvastatin   40 mg Oral QPM   azithromycin   500 mg Oral Daily   carvedilol   6.25 mg Oral BID WC   clopidogrel   75 mg Oral Daily   enoxaparin  (LOVENOX ) injection  40 mg Subcutaneous Daily   finasteride   5 mg Oral Daily   FLUoxetine   20 mg Oral Daily   folic acid   1 mg Oral Daily   pantoprazole   40 mg Oral Daily   polyethylene glycol  17 g Oral Daily   QUEtiapine   50 mg Oral QHS   senna-docusate  2 tablet Oral BID   tamsulosin   0.4 mg Oral Daily   Continuous:  cefTRIAXone  (ROCEPHIN )  IV 1 g (02/01/24 0442)   PRN:acetaminophen  **OR** acetaminophen , albuterol , ondansetron  **OR** ondansetron  (ZOFRAN ) IV, traZODone   Antibiotics: Anti-infectives (From admission, onward)    Start     Dose/Rate Route Frequency Ordered Stop   01/30/24 1000  azithromycin  (ZITHROMAX ) tablet 500 mg        500 mg Oral Daily 01/29/24 1202 02/04/24 0959   01/29/24 0600  azithromycin  (ZITHROMAX ) 500 mg in sodium chloride  0.9 % 250 mL IVPB  Status:  Discontinued        500 mg 250 mL/hr over 60 Minutes Intravenous Every 24 hours 01/28/24 0740 01/29/24 1202   01/29/24 0500  cefTRIAXone  (ROCEPHIN ) 1 g in sodium chloride  0.9 % 100 mL IVPB        1 g 200 mL/hr over 30 Minutes Intravenous Every 24 hours 01/28/24 0740     01/28/24 0430  cefTRIAXone  (ROCEPHIN ) 1 g in sodium chloride  0.9 % 100 mL IVPB        1 g 200 mL/hr over 30 Minutes Intravenous  Once  01/28/24 0417 01/28/24 0550   01/28/24 0430  azithromycin  (ZITHROMAX ) 500 mg in sodium chloride  0.9 % 250 mL IVPB        500 mg 250 mL/hr over 60 Minutes Intravenous  Once 01/28/24 0417 01/28/24 0706       Objective:  Vital Signs  Vitals:   01/31/24 1240 01/31/24 1821 01/31/24 2054 02/01/24 0940  BP: 126/62 (!) 147/62 (!) 143/65 (!) 141/67  Pulse: 66 69 63   Resp: 18  18 18   Temp: 97.8 F (36.6 C)  98.1 F (36.7 C)   TempSrc: Oral     SpO2: 95% 96% 94% 95%    Intake/Output Summary (Last 24 hours) at 02/01/2024 1003 Last data filed at 02/01/2024 0800 Gross per 24 hour  Intake 1200 ml  Output 2650 ml  Net -1450 ml    General appearance: Awake alert.  In no distress Resp: Clear to auscultation bilaterally.  Normal effort Cardio: S1-S2 is normal regular.  No S3-S4.  No rubs murmurs or bruit GI: Abdomen is soft.  Nontender nondistended.  Bowel sounds are present normal.  No masses organomegaly   Lab Results:  Data Reviewed: I have personally reviewed following labs and reports of the imaging studies  CBC: Recent Labs  Lab 01/28/24 0213 01/29/24 0516 02/01/24 0528  WBC 9.7 8.3 10.3  HGB 11.0* 11.5* 12.3*  HCT 37.1* 37.3* 40.8  MCV 90.3 89.9 90.9  PLT 313 287 314    Basic Metabolic Panel: Recent Labs  Lab 01/28/24 0213 01/29/24 0516 02/01/24 0528  NA 141 141 140  K 4.4 4.1 4.5  CL 108 108 106  CO2 24 26 24   GLUCOSE 108* 103* 89  BUN 18 15 21   CREATININE 0.93 0.96 0.93  CALCIUM  9.2 9.1 9.8  MG 2.2  --  2.1  PHOS  --   --  3.2    GFR: CrCl cannot be calculated (Unknown ideal weight.).   Recent Results (from the past 240 hours)  Resp panel by RT-PCR (RSV, Flu A&B, Covid) Anterior Nasal Swab     Status: None   Collection Time: 01/28/24  3:27 AM   Specimen: Anterior Nasal Swab  Result Value Ref Range Status   SARS Coronavirus 2 by RT PCR NEGATIVE NEGATIVE Final    Comment: (NOTE) SARS-CoV-2 target nucleic acids are NOT DETECTED.  The SARS-CoV-2  RNA is generally detectable in upper respiratory specimens during the acute phase of infection. The lowest concentration of SARS-CoV-2 viral copies this assay can detect is 138 copies/mL. A negative result does not preclude SARS-Cov-2 infection and should not be used as the sole basis for treatment or other patient management decisions. A negative result may occur with  improper specimen collection/handling, submission of specimen other than nasopharyngeal swab, presence of viral mutation(s) within the areas targeted by this assay, and inadequate number of viral copies(<138 copies/mL). A negative result must be combined with clinical observations,  patient history, and epidemiological information. The expected result is Negative.  Fact Sheet for Patients:  BloggerCourse.com  Fact Sheet for Healthcare Providers:  SeriousBroker.it  This test is no t yet approved or cleared by the [place]  FDA and  has been authorized for detection and/or diagnosis of SARS-CoV-2 by FDA under an Emergency Use Authorization (EUA). This EUA will remain  in effect (meaning this test can be used) for the duration of the COVID-19 declaration under Section 564(b)(1) of the Act, 21 U.S.C.section 360bbb-3(b)(1), unless the authorization is terminated  or revoked sooner.       Influenza A by PCR NEGATIVE NEGATIVE Final   Influenza B by PCR NEGATIVE NEGATIVE Final    Comment: (NOTE) The Xpert Xpress SARS-CoV-2/FLU/RSV plus assay is intended as an aid in the diagnosis of influenza from Nasopharyngeal swab specimens and should not be used as a sole basis for treatment. Nasal washings and aspirates are unacceptable for Xpert Xpress SARS-CoV-2/FLU/RSV testing.  Fact Sheet for Patients: BloggerCourse.com  Fact Sheet for Healthcare Providers: SeriousBroker.it  This test is not yet approved or cleared by the  [place]  FDA and has been authorized for detection and/or diagnosis of SARS-CoV-2 by FDA under an Emergency Use Authorization (EUA). This EUA will remain in effect (meaning this test can be used) for the duration of the COVID-19 declaration under Section 564(b)(1) of the Act, 21 U.S.C. section 360bbb-3(b)(1), unless the authorization is terminated or revoked.     Resp Syncytial Virus by PCR NEGATIVE NEGATIVE Final    Comment: (NOTE) Fact Sheet for Patients: BloggerCourse.com  Fact Sheet for Healthcare Providers: SeriousBroker.it  This test is not yet approved or cleared by the [place]  FDA and has been authorized for detection and/or diagnosis of SARS-CoV-2 by FDA under an Emergency Use Authorization (EUA). This EUA will remain in effect (meaning this test can be used) for the duration of the COVID-19 declaration under Section 564(b)(1) of the Act, 21 U.S.C. section 360bbb-3(b)(1), unless the authorization is terminated or revoked.  Performed at [facility], [address] [place], [place] [address]       Radiology Studies: No results found.      LOS: 4 days   Brittni Hult [employer] on [email or web address]  02/01/2024, 10:03 AM

## 2024-02-01 NOTE — Plan of Care (Signed)

## 2024-02-01 NOTE — Progress Notes (Signed)
 OT Cancellation Note  Patient Details Name: Dennis [name] MRN: [id number] DOB: 05-Jun-1945   Cancelled Treatment:    Reason Eval/Treat Not Completed: OT screened, no needs identified, will sign off Patient with no acute OT needs, patient is from memory care where he is incontinent at baseline, needs assist for ADLs, and feeds himself. Per RN and OT arriving in room with patient having ambulated to bathroom with NT, patient is back at his baseline and does not need a formal OT assessment. Patient would benefit from ambulation with mobility specialist while in the hospital. OT will sign off at this time, please re-consult if further acute needs arise.   Ronal Gift E. Rahima Fleishman, OTR/L Acute Rehabilitation Services [phone]   Ronal Gift Salt 02/01/2024, 9:26 AM

## 2024-02-02 DIAGNOSIS — Z515 Encounter for palliative care: Secondary | ICD-10-CM | POA: Diagnosis not present

## 2024-02-02 DIAGNOSIS — R531 Weakness: Secondary | ICD-10-CM | POA: Diagnosis not present

## 2024-02-02 DIAGNOSIS — J180 Bronchopneumonia, unspecified organism: Secondary | ICD-10-CM | POA: Diagnosis not present

## 2024-02-02 DIAGNOSIS — Z7189 Other specified counseling: Secondary | ICD-10-CM | POA: Diagnosis not present

## 2024-02-02 MED ORDER — CEFDINIR 300 MG PO CAPS: 300.0000 mg | ORAL_CAPSULE | Freq: Two times a day (BID) | ORAL | Status: AC

## 2024-02-02 MED ORDER — CARVEDILOL 6.25 MG PO TABS: 6.2500 mg | ORAL_TABLET | Freq: Two times a day (BID) | ORAL | Status: AC

## 2024-02-02 MED ORDER — POLYETHYLENE GLYCOL 3350 17 G PO PACK: 17.0000 g | PACK | Freq: Every day | ORAL | Status: AC

## 2024-02-02 NOTE — Discharge Summary (Signed)
 [facility]  Physician Discharge Summary   Patient ID: Dennis [name] MRN: [id number] DOB/AGE: 06/27/1945 78 y.o.  Admit date: 01/28/2024 Discharge date:   02/02/2024   PCP: Pcp, No  DISCHARGE DIAGNOSES:    Bronchopneumonia   History of CVA (cerebrovascular accident)   Dementia (HCC)   Community acquired pneumonia   RECOMMENDATIONS FOR OUTPATIENT FOLLOW UP: After discussions with patient's legal guardian a two-physician DNR form has been filled and will be sent to patient's legal guardian.  This will need to be acted on by the outpatient palliative care team    Home Health: SNF Equipment/Devices: None  CODE STATUS: Full code for now  DISCHARGE CONDITION: fair  Diet recommendation: Regular as tolerated  INITIAL HISTORY: 78 y.o. male with medical history significant for hypertension, dementia recent hospitalization for sepsis likely due to viral pneumonia now being admitted to the hospital with community-acquired pneumonia.  Patient has advanced dementia has a history of CVA he was not able to provide any history.  He just finished a course of Augmentin  that was prescribed at hospital discharge on 9/2.    Consultants: Palliative care   Procedures: None  HOSPITAL COURSE:   Community-acquired pneumonia No sepsis noted at the time of admission. CT findings favored bronchopneumonia. Recent hospitalization for pneumonia noted.  Recently completed course of Augmentin . Has been seen by speech therapy.  Remains on a regular diet. COVID-19 influenza and RSV PCR negative.  Strep pneumo urinary antigen negative. Patient seems to have improved with treatment.   He will be transition to oral antibiotics.  Previously he was discharged on Augmentin  and he completed the course of same.  At this time we will discharge him on Omnicef . Respiratory status is stable.     History of dementia Noted to be on multiple mood stabilizing agents which will be continued.    History of  stroke Continue Plavix  and statin.   BPH Continue finasteride  and Flomax .   Essential hypertension Noted to be on carvedilol .  Blood pressure is reasonably well-controlled.   Pulmonary nodule Incidentally noted on CT scan.  Will defer to outpatient providers.   Normocytic anemia Stable hemoglobin.  No evidence for any bleeding.   Goals of care This is patient's third hospitalization in the last 1 month.  Palliative care was consulted.  A two-physician DNR form will be completed which will need to be acted upon by outpatient palliative care.  Full code for now but once legal guardian gives permission CODE STATUS can be changed to DNR.  Patient is stable.  Okay for discharge to SNF today.     PERTINENT LABS:  The results of significant diagnostics from this hospitalization (including imaging, microbiology, ancillary and laboratory) are listed below for reference.    Microbiology: Recent Results (from the past 240 hours)  Resp panel by RT-PCR (RSV, Flu A&B, Covid) Anterior Nasal Swab     Status: None   Collection Time: 01/28/24  3:27 AM   Specimen: Anterior Nasal Swab  Result Value Ref Range Status   SARS Coronavirus 2 by RT PCR NEGATIVE NEGATIVE Final    Comment: (NOTE) SARS-CoV-2 target nucleic acids are NOT DETECTED.  The SARS-CoV-2 RNA is generally detectable in upper respiratory specimens during the acute phase of infection. The lowest concentration of SARS-CoV-2 viral copies this assay can detect is 138 copies/mL. A negative result does not preclude SARS-Cov-2 infection and should not be used as the sole basis for treatment or other patient management decisions. A negative result may  occur with  improper specimen collection/handling, submission of specimen other than nasopharyngeal swab, presence of viral mutation(s) within the areas targeted by this assay, and inadequate number of viral copies(<138 copies/mL). A negative result must be combined with clinical  observations, patient history, and epidemiological information. The expected result is Negative.  Fact Sheet for Patients:  BloggerCourse.com  Fact Sheet for Healthcare Providers:  SeriousBroker.it  This test is no t yet approved or cleared by the [place]  FDA and  has been authorized for detection and/or diagnosis of SARS-CoV-2 by FDA under an Emergency Use Authorization (EUA). This EUA will remain  in effect (meaning this test can be used) for the duration of the COVID-19 declaration under Section 564(b)(1) of the Act, 21 U.S.C.section 360bbb-3(b)(1), unless the authorization is terminated  or revoked sooner.       Influenza A by PCR NEGATIVE NEGATIVE Final   Influenza B by PCR NEGATIVE NEGATIVE Final    Comment: (NOTE) The Xpert Xpress SARS-CoV-2/FLU/RSV plus assay is intended as an aid in the diagnosis of influenza from Nasopharyngeal swab specimens and should not be used as a sole basis for treatment. Nasal washings and aspirates are unacceptable for Xpert Xpress SARS-CoV-2/FLU/RSV testing.  Fact Sheet for Patients: BloggerCourse.com  Fact Sheet for Healthcare Providers: SeriousBroker.it  This test is not yet approved or cleared by the [place]  FDA and has been authorized for detection and/or diagnosis of SARS-CoV-2 by FDA under an Emergency Use Authorization (EUA). This EUA will remain in effect (meaning this test can be used) for the duration of the COVID-19 declaration under Section 564(b)(1) of the Act, 21 U.S.C. section 360bbb-3(b)(1), unless the authorization is terminated or revoked.     Resp Syncytial Virus by PCR NEGATIVE NEGATIVE Final    Comment: (NOTE) Fact Sheet for Patients: BloggerCourse.com  Fact Sheet for Healthcare Providers: SeriousBroker.it  This test is not yet approved or cleared by  the [place]  FDA and has been authorized for detection and/or diagnosis of SARS-CoV-2 by FDA under an Emergency Use Authorization (EUA). This EUA will remain in effect (meaning this test can be used) for the duration of the COVID-19 declaration under Section 564(b)(1) of the Act, 21 U.S.C. section 360bbb-3(b)(1), unless the authorization is terminated or revoked.  Performed at [facility], [address], [place], [place] [address]      Labs:   Basic Metabolic Panel: Recent Labs  Lab 01/28/24 0213 01/29/24 0516 02/01/24 0528  NA 141 141 140  K 4.4 4.1 4.5  CL 108 108 106  CO2 24 26 24   GLUCOSE 108* 103* 89  BUN 18 15 21   CREATININE 0.93 0.96 0.93  CALCIUM  9.2 9.1 9.8  MG 2.2  --  2.1  PHOS  --   --  3.2   CBC: Recent Labs  Lab 01/28/24 0213 01/29/24 0516 02/01/24 0528  WBC 9.7 8.3 10.3  HGB 11.0* 11.5* 12.3*  HCT 37.1* 37.3* 40.8  MCV 90.3 89.9 90.9  PLT 313 287 314    BNP: BNP (last 3 results) Recent Labs    03/01/23 0245 03/03/23 0313 06/22/23 1251  BNP 1,535.3* 489.2* 946.6*     IMAGING STUDIES CT Angio Chest PE W and/or Wo Contrast Result Date: 01/28/2024 EXAM: CTA of the Chest with contrast for PE 01/28/2024 03:41:00 AM TECHNIQUE: CTA of the chest was performed after the administration of intravenous contrast. Multiplanar reformatted images are provided for review. MIP images are provided for review. Automated exposure control, iterative reconstruction, and/or  weight based adjustment of the mA/kV was utilized to reduce the radiation dose to as low as reasonably achievable. COMPARISON: Same day chest x-ray. CLINICAL HISTORY: Pulmonary embolism (PE) suspected, low to intermediate prob, positive D-dimer; SOB, ? nodule on x-ray, hypoxia. FINDINGS: PULMONARY ARTERIES: Pulmonary arteries are adequately opacified for evaluation. No pulmonary embolism. Main pulmonary artery is normal in caliber. MEDIASTINUM: The heart and pericardium  demonstrate no acute abnormality. Coronary artery and aortic atherosclerotic calcification. Normal caliber thoracic aorta. LYMPH NODES: No mediastinal, hilar or axillary lymphadenopathy. LUNGS AND PLEURA: Clustering nodularity in the bilateral upper lobes. Diffuse bronchial wall thickening with scattered mucus plugging. Scattered pulmonary nodules, for example, a 9 mm nodule in series 12, image 99, in the left lower lobe, 11 mm subpleural nodule in the anterior lingula on 04/23/21, 13 x 8 mm subpleural nodule in the anterior right upper lobe on series 12 image 70. These are favored to be infectious/inflammatory. There are patchy peribronchovascular ground-glass opacities bilaterally. No pleural effusion or pneumothorax. UPPER ABDOMEN: Limited images of the upper abdomen are unremarkable. SOFT TISSUES AND BONES: No acute bone or soft tissue abnormality. IMPRESSION: 1. No pulmonary embolism. 2. Findings favor bronchopneumonia. 3. Multiple solid pulmonary nodules >8 mm, favored related to #2. Follow-up CT in 3 months is recommended to ensure resolution. Electronically signed by: Norman Gatlin MD 01/28/2024 04:02 AM EDT RP Workstation: [id number]   DG Chest 2 View Result Date: 01/28/2024 EXAM: 2 VIEW(S) XRAY OF THE CHEST 01/28/2024 02:30:00 AM COMPARISON: 01/17/2024 CLINICAL HISTORY: shob. Shortness of breath FINDINGS: LUNGS AND PLEURA: Streaky bibasilar opacities, likely atelectasis. Nodular opacity at right apex is favored secondary to degenerative change at the 1st costo- sternal junction. No pleural effusion. No pneumothorax. HEART AND MEDIASTINUM: No acute abnormality of the cardiac and mediastinal silhouettes. Atherosclerotic calcifications. BONES AND SOFT TISSUES: Mild degenerative changes of thoracic spine. No acute osseous abnormality. IMPRESSION: 1. Streaky bibasilar opacities, likely atelectasis. 2. Nodular opacity at right apex, favored secondary to degenerative change at the 1st costo-sternal junction.  If desired, a pulmonary nodule could be excluded with non-emergent CT chest. Electronically signed by: Norman Gatlin MD 01/28/2024 02:42 AM EDT RP Workstation: [id number]   DG Chest Port 1 View Result Date: 01/17/2024 CLINICAL DATA:  Sepsis, fever EXAM: PORTABLE CHEST 1 VIEW COMPARISON:  12/28/2023, 09/16/2023 FINDINGS: The lungs are symmetrically well expanded. Stable nodular density at the right apex. No confluent pulmonary infiltrate. No pneumothorax or pleural effusion. Cardiac size is at the upper limits of normal. Pulmonary vascularity is normal. Osseous structures are age-appropriate. No acute bone abnormality. IMPRESSION: 1. No active disease. 2. Stable nodular density at the right apex. This could be better assessed with dedicated nonemergent CT imaging. Electronically Signed   By: Dorethia Molt M.D.   On: 01/17/2024 00:46    DISCHARGE EXAMINATION: Vitals:   02/01/24 0940 02/01/24 1219 02/01/24 2142 02/02/24 0530  BP: (!) 141/67 (!) 129/58 (!) 154/73 (!) 144/67  Pulse:  73 66 60  Resp: 18 20 20 18   Temp:  97.6 F (36.4 C) 97.7 F (36.5 C) 97.6 F (36.4 C)  TempSrc:  Oral Oral   SpO2: 95% 94% 97% 94%   General appearance: Awake alert.  In no distress Resp: Clear to auscultation bilaterally.  Normal effort Cardio: S1-S2 is normal regular.  No S3-S4.  No rubs murmurs or bruit GI: Abdomen is soft.  Nontender nondistended.  Bowel sounds are present normal.  No masses organomegaly   DISPOSITION: SNF  Discharge Instructions  Call MD for:  difficulty breathing, headache or visual disturbances   Complete by: As directed    Call MD for:  extreme fatigue   Complete by: As directed    Call MD for:  persistant dizziness or light-headedness   Complete by: As directed    Call MD for:  persistant nausea and vomiting   Complete by: As directed    Call MD for:  severe uncontrolled pain   Complete by: As directed    Call MD for:  temperature >100.4   Complete by: As directed     Diet general   Complete by: As directed    Discharge instructions   Complete by: As directed    Please review instructions on the discharge summary.  You were cared for by a hospitalist during your hospital stay. If you have any questions about your discharge medications or the care you received while you were in the hospital after you are discharged, you can call the unit and asked to speak with the hospitalist on call if the hospitalist that took care of you is not available. Once you are discharged, your primary care physician will handle any further medical issues. Please note that NO REFILLS for any discharge medications will be authorized once you are discharged, as it is imperative that you return to your primary care physician (or establish a relationship with a primary care physician if you do not have one) for your aftercare needs so that they can reassess your need for medications and monitor your lab values. If you do not have a primary care physician, you can call [phone] for a physician referral.   Increase activity slowly   Complete by: As directed          Allergies as of 02/02/2024       Reactions   Codeine Other (See Comments)   Unknown reaction   Shellfish Allergy Other (See Comments)   Unknown reaction        Medication List     STOP taking these medications    aspirin  EC 81 MG tablet       TAKE these medications    atorvastatin  40 MG tablet Commonly known as: LIPITOR  Take 1 tablet (40 mg total) by mouth daily. What changed: when to take this   carvedilol  6.25 MG tablet Commonly known as: COREG  Take 1 tablet (6.25 mg total) by mouth 2 (two) times daily with a meal. What changed: how much to take   cefdinir  300 MG capsule Commonly known as: OMNICEF  Take 1 capsule (300 mg total) by mouth 2 (two) times daily for 7 days.   clopidogrel  75 MG tablet Commonly known as: PLAVIX  Take 1 tablet (75 mg total) by mouth daily.   docusate sodium  100 MG  capsule Commonly known as: COLACE Take 1 capsule (100 mg total) by mouth 2 (two) times daily.   finasteride  5 MG tablet Commonly known as: PROSCAR  Take 1 tablet (5 mg total) by mouth daily.   FLUoxetine  20 MG capsule Commonly known as: PROZAC  Take 20 mg by mouth daily.   folic acid  1 MG tablet Commonly known as: FOLVITE  Take 1 tablet (1 mg total) by mouth daily.   pantoprazole  40 MG tablet Commonly known as: PROTONIX  Take 1 tablet (40 mg total) by mouth daily.   polyethylene glycol 17 g packet Commonly known as: MIRALAX  / GLYCOLAX  Take 17 g by mouth daily. What changed:  when to take this reasons to take this   QUEtiapine   50 MG tablet Commonly known as: SEROQUEL  Take 1 tablet (50 mg total) by mouth at bedtime.   senna-docusate 8.6-50 MG tablet Commonly known as: Senokot-S Take 1 tablet by mouth at bedtime as needed for mild constipation.   tamsulosin  0.4 MG Caps capsule Commonly known as: FLOMAX  Take 1 capsule (0.4 mg total) by mouth daily.           TOTAL DISCHARGE TIME: 35 minutes  Jadon Harbaugh [employer] on [email or web address]  02/02/2024, 8:59 AM

## 2024-02-02 NOTE — TOC Transition Note (Signed)
 Transition of Care [facility]) - Discharge Note   Patient Details  Name: Dennis [name] MRN: [id number] Date of Birth: 1946-03-02  Transition of Care [facility] CM/SW Contact:  Sheri ONEIDA Sharps, LCSW Phone Number: 02/02/2024, 11:52 AM   Clinical Narrative:    Pt medically ready to dc to [facility]. Call report [phone] room 302 given to nurse. DC packet left at nurses station. PTAR called at 11am. No further needs.   Final next level of care: Memory Care Barriers to Discharge: Barriers Resolved   Patient Goals and CMS Choice Patient states their goals for this hospitalization and ongoing recovery are:: return to memory care CMS Medicare.gov Compare Post Acute Care list provided to:: Legal Guardian Choice offered to / list presented to : [facility] POA / Guardian Lee ownership interest in [facility].provided to:: [facility] POA / Guardian    Discharge Placement   Existing PASRR number confirmed : 02/02/24          Patient chooses bed at: [facility] Patient to be transferred to facility by: PTAR Name of family member notified: Lawerence Marsh Doctor, hospital Guardian)  [phone] [facility] Phone)    (left voicemail) Patient and family notified of of transfer: 02/02/24  Discharge Plan and Services Additional resources added to the After Visit Summary for     Discharge Planning Services: CM Consult            DME Arranged: N/A DME Agency: NA       HH Arranged: NA HH Agency: NA        Social Drivers of Health (SDOH) Interventions SDOH Screenings   Food Insecurity: No Food Insecurity (01/29/2024)  Housing: Low Risk  (01/29/2024)  Transportation Needs: No Transportation Needs (01/29/2024)  Utilities: Not At Risk (01/29/2024)  Social Connections: Moderately Isolated (09/17/2023)  Tobacco Use: Medium Risk (01/28/2024)     Readmission Risk Interventions    01/29/2024    3:36 PM  Readmission Risk Prevention Plan  Transportation Screening Complete  Medication Review (RN Care  Manager) Complete  PCP or Specialist appointment within 3-5 days of discharge Complete  HRI or Home Care Consult Complete  SW Recovery Care/Counseling Consult Complete  Palliative Care Screening Not Applicable  Skilled Nursing Facility Complete

## 2024-02-02 NOTE — Progress Notes (Incomplete)
 Pt. carried out discharge report to SNF [facility]  at rm 302 to assign nuirse (crystal Bishop). PTAR  handling the DC pt. packet given.

## 2024-02-02 NOTE — Progress Notes (Signed)
 Daily Progress Note   Patient Name: Dennis [name]       Date: 02/02/2024 DOB: [birth year]  Age: 78 y.o. MRN#: [id number] Attending Physician: Verdene Purchase, MD Primary Care Physician: Pcp, No Admit Date: 01/28/2024  Reason for Consultation/Follow-up: Establishing goals of care  Subjective: Resting in bed, appears with chronic generalized weakness, no acute distress.  Length of Stay: 5  Current Medications: Scheduled Meds:   atorvastatin   40 mg Oral QPM   azithromycin   500 mg Oral Daily   carvedilol   6.25 mg Oral BID WC   clopidogrel   75 mg Oral Daily   enoxaparin  (LOVENOX ) injection  40 mg Subcutaneous Daily   finasteride   5 mg Oral Daily   FLUoxetine   20 mg Oral Daily   folic acid   1 mg Oral Daily   Influenza vac split trivalent PF  0.5 mL Intramuscular Tomorrow-1000   pantoprazole   40 mg Oral Daily   polyethylene glycol  17 g Oral Daily   QUEtiapine   50 mg Oral QHS   senna-docusate  2 tablet Oral BID   tamsulosin   0.4 mg Oral Daily    Continuous Infusions:  cefTRIAXone  (ROCEPHIN )  IV 1 g (02/02/24 0527)    PRN Meds: acetaminophen  **OR** acetaminophen , albuterol , ondansetron  **OR** ondansetron  (ZOFRAN ) IV, traZODone   Physical Exam         Awake alert No acute distress Appears chronically ill Appears with generalized weakness Appears to have regular work of breathing  Vital Signs: BP (!) 144/67 (BP Location: Left Arm)   Pulse 60   Temp 97.6 F (36.4 C)   Resp 18   SpO2 94%  SpO2: SpO2: 94 % O2 Device: O2 Device: Room Air O2 Flow Rate: O2 Flow Rate (L/min): 16 L/min  Intake/output summary:  Intake/Output Summary (Last 24 hours) at 02/02/2024 1023 Last data filed at 02/02/2024 0530 Gross per 24 hour  Intake 360 ml  Output 1400 ml  Net -1040 ml   LBM: Last  BM Date : 02/01/24 Baseline Weight:   Most recent weight:         Palliative Assessment/Data:      Patient Active Problem List   Diagnosis Date Noted   Palliative care encounter 01/30/2024   Goals of care, counseling/discussion 01/30/2024   Counseling and coordination of care 01/30/2024   History of CVA (  cerebrovascular accident) 01/30/2024   Dementia (HCC) 01/30/2024   Community acquired pneumonia 01/30/2024   Bronchopneumonia 01/28/2024   Sepsis (HCC) 01/17/2024   Pneumonia 01/17/2024   Expressive aphasia 12/29/2023   Acute encephalopathy 12/29/2023   History of essential hypertension 12/29/2023   Anemia of chronic disease 12/29/2023   Dysarthria 12/28/2023   Syncope 09/16/2023   AKI (acute kidney injury) (HCC) 09/16/2023   UTI (urinary tract infection) 09/16/2023   Acute metabolic encephalopathy 09/16/2023   History of stroke 09/16/2023   Agitation 07/10/2023   Dysphagia 07/02/2023   Dementia with behavioral disturbance (HCC) 07/02/2023   Acute CVA (cerebrovascular accident) (HCC) 06/21/2023   BPH (benign prostatic hyperplasia) 06/21/2023   Altered mental state 02/22/2023   Difficulty walking 02/22/2023   HLD (hyperlipidemia) 06/02/2009   HTN (hypertension) 06/02/2009   AORTIC INSUFFICIENCY, MILD 06/02/2009   DYSPNEA ON EXERTION 06/02/2009   CHEST PAIN, ATYPICAL 06/02/2009    Palliative Care Assessment & Plan   Patient Profile:    Assessment: 78 year old gentleman with hypertension dementia recent hospitalization for sepsis likely due to viral pneumonia, currently admitted to hospital medicine service with community-acquired pneumonia Advanced dementia History of CVAs Functional decline Palliative consult for CODE STATUS and broad goals of care Legal guardian Nat weeks Recommendations/Plan: This being the third hospitalization in the last 1 month, the patient to having a legal guardian, appropriate paperwork was duly filled out by primary attending as  well as the undersigned in the capacity of non-- attending physician with regards to establishing DO NOT RESUSCITATE/DO NOT INTUBATE for CODE STATUS.  This has been deemed appropriate with regards to the patient's history of dementia, recurrent infections as well as ongoing functional decline.  Code Status:    Code Status Orders  (From admission, onward)           Start     Ordered   01/28/24 0740  Full code  Continuous       Question:  By:  Answer:  Consent: discussion documented in EHR   01/28/24 0740           Code Status History     Date Active Date Inactive Code Status Order ID Comments User Context   01/17/2024 0736 01/20/2024 1754 Full Code [id number]  Dennis Nydia POUR, MD ED   12/28/2023 2022 12/31/2023 0002 Full Code [id number]  Dennis Eva NOVAK, DO ED   09/16/2023 2104 09/19/2023 1906 Full Code [id number]  Dennis Madison, MD Inpatient   06/21/2023 0758 08/14/2023 1800 Full Code [id number]  Dennis Rockie CROME, NP ED   02/22/2023 2115 03/16/2023 1805 Full Code [id number]  Dennis Roosevelt, MD ED       Prognosis:  < 12 months  Discharge Planning: Skilled Nursing Facility for rehab with Palliative care service follow-up  Care plan was discussed with various members of the interdisciplinary team including palliative NP, TRH MD  Thank you for allowing the Palliative Medicine Team to assist in the care of this patient. High MDM    Greater than 50%  of this time was spent counseling and coordinating care related to the above assessment and plan.  Dennis Serve, MD  Please contact Palliative Medicine Team phone at [phone] for questions and concerns.

## 2024-02-02 NOTE — [place] FL2 (Addendum)
 [place]  MEDICAID FL2 LEVEL OF CARE FORM     IDENTIFICATION  Patient Name: Dennis [name] Birthdate: 08/27/1945 Sex: male Admission Date (Current Location): 01/28/2024  [facility] and [place] Number:  Producer, television/film/video and Address:  [facility],  [address], [place] [address]      Provider Number: [id number]  Attending Physician Name and Address:  Verdene Purchase, MD  Relative Name and Phone Number:  Lawerence Marsh (Legal Guardian)  [phone]    Current Level of Care: Hospital Recommended Level of Care: Memory Care Prior Approval Number:    Date Approved/Denied:   PASRR Number: [id number]  Discharge Plan: Other (Comment) (memory care)    Current Diagnoses: Patient Active Problem List   Diagnosis Date Noted   Palliative care encounter 01/30/2024   Goals of care, counseling/discussion 01/30/2024   Counseling and coordination of care 01/30/2024   History of CVA (cerebrovascular accident) 01/30/2024   Dementia (HCC) 01/30/2024   Community acquired pneumonia 01/30/2024   Bronchopneumonia 01/28/2024   Sepsis (HCC) 01/17/2024   Pneumonia 01/17/2024   Expressive aphasia 12/29/2023   Acute encephalopathy 12/29/2023   History of essential hypertension 12/29/2023   Anemia of chronic disease 12/29/2023   Dysarthria 12/28/2023   Syncope 09/16/2023   AKI (acute kidney injury) (HCC) 09/16/2023   UTI (urinary tract infection) 09/16/2023   Acute metabolic encephalopathy 09/16/2023   History of stroke 09/16/2023   Agitation 07/10/2023   Dysphagia 07/02/2023   Dementia with behavioral disturbance (HCC) 07/02/2023   Acute CVA (cerebrovascular accident) (HCC) 06/21/2023   BPH (benign prostatic hyperplasia) 06/21/2023   Altered mental state 02/22/2023   Difficulty walking 02/22/2023   HLD (hyperlipidemia) 06/02/2009   HTN (hypertension) 06/02/2009   AORTIC INSUFFICIENCY, MILD 06/02/2009   DYSPNEA ON EXERTION 06/02/2009   CHEST PAIN, ATYPICAL 06/02/2009     Orientation RESPIRATION BLADDER Height & Weight     Self  Normal Incontinent Weight:   Height:     BEHAVIORAL SYMPTOMS/MOOD NEUROLOGICAL BOWEL NUTRITION STATUS      Continent Diet (regular)  AMBULATORY STATUS COMMUNICATION OF NEEDS Skin   Independent Verbally Normal                       Personal Care Assistance Level of Assistance  Bathing, Feeding, Dressing Bathing Assistance: Limited assistance Feeding assistance: Independent Dressing Assistance: Limited assistance     Functional Limitations Info  Sight, Hearing, Speech Sight Info: Impaired Hearing Info: Impaired Speech Info: Adequate    SPECIAL CARE FACTORS FREQUENCY                       Contractures Contractures Info: Not present    Additional Factors Info  Code Status, Allergies Code Status Info: Full code Allergies Info: Codeine  Shellfish Allergy           Current Medications (02/02/2024):  Discharge Medications: Please see discharge summary for a list of discharge medications. atorvastatin  40 MG tablet Commonly known as: LIPITOR  Take 1 tablet (40 mg total) by mouth daily. What changed: when to take this    carvedilol  6.25 MG tablet Commonly known as: COREG  Take 1 tablet (6.25 mg total) by mouth 2 (two) times daily with a meal. What changed: how much to take    cefdinir  300 MG capsule Commonly known as: OMNICEF  Take 1 capsule (300 mg total) by mouth 2 (two) times daily for 7 days.    clopidogrel  75 MG tablet Commonly known as: PLAVIX  Take  1 tablet (75 mg total) by mouth daily.    docusate sodium  100 MG capsule Commonly known as: COLACE Take 1 capsule (100 mg total) by mouth 2 (two) times daily.    finasteride  5 MG tablet Commonly known as: PROSCAR  Take 1 tablet (5 mg total) by mouth daily.    FLUoxetine  20 MG capsule Commonly known as: PROZAC  Take 20 mg by mouth daily.    folic acid  1 MG tablet Commonly known as: FOLVITE  Take 1 tablet (1 mg total) by mouth daily.     pantoprazole  40 MG tablet Commonly known as: PROTONIX  Take 1 tablet (40 mg total) by mouth daily.    polyethylene glycol 17 g packet Commonly known as: MIRALAX  / GLYCOLAX  Take 17 g by mouth daily. What changed:  when to take this reasons to take this    QUEtiapine  50 MG tablet Commonly known as: SEROQUEL  Take 1 tablet (50 mg total) by mouth at bedtime.    senna-docusate 8.6-50 MG tablet Commonly known as: Senokot-S Take 1 tablet by mouth at bedtime as needed for mild constipation.    tamsulosin  0.4 MG Caps capsule Commonly known as: FLOMAX  Take 1 capsule (0.4 mg total) by mouth daily.          Relevant Imaging Results:  Relevant Lab Results:   Additional Information SSN [id number], [place]

## 2024-02-07 NOTE — Progress Notes (Unsigned)
 Cardiology Office Note:   Date:  02/13/2024  ID:  Dennis [name], DOB December 15, 1945, MRN [id number] PCP:  Freddrick Johns  [facility] Providers Cardiologist:  [name], MD Referring MD: Perri DELENA Meliton Mickey.,*  Chief Complaint/Reason for Referral: Presyncope ASSESSMENT:    1. Postural dizziness with presyncope   2. Cardiomyopathy, unspecified type (HCC)   3. Diastolic dysfunction   4. Primary hypertension   5. Hyperlipidemia LDL goal <55   6. Aortic atherosclerosis   7. Dementia with behavioral disturbance (HCC)   8. DNR (do not resuscitate)   9. Acute CVA (cerebrovascular accident) (HCC)     PLAN:   In order of problems listed above: Presyncope: No recurrence since several months.  Continue to monitor.   Cardiomyopathy: Given patient's dementia and DNR status no invasive procedures will be pursued.  Will treat medically.  Continue Coreg  6.25 twice daily, not a good candidate for SGLT2 inhibitor.  Start losartan  25 mg at bedtime.  Will defer ischemic evaluation. Diastolic dysfunction: Not a candidate for SGLT2 inhibitor.  Start losartan  25 mg at bedtime Hypertension: Continue Coreg  6.25 twice daily.  BP is well-controlled Hyperlipidemia: Continue atorvastatin  40; check lipid panel, LFTs, LP(a) today Aortic atherosclerosis: Continue Plavix  75, atorvastatin  40 Dementia: Continue Seroquel  50 DNR: See discussion above. History of stroke: Continue Plavix  75, atorvastatin  40.  Goal LDL less than 55            Dispo:  No follow-ups on file.       I spent 35 minutes reviewing all clinical data during and prior to this visit including all relevant imaging studies, laboratories, clinical information from other health systems and prior notes from both Cardiology and other specialties, interviewing the patient, conducting a complete physical examination, and coordinating care in order to formulate a comprehensive and personalized evaluation and treatment plan.   History of Present  Illness:    FOCUSED PROBLEM LIST:   Cardiomyopathy  EF 45 to 50% TTE 2024 EF 20 to 25% TTE February 2025 EF 45% TTE May 2025 EF 45 to 50% TTE August 2025 Diastolic dysfunction G2 DD, no significant valve issues, EF 45 to 50% TTE August 2025 Hypertension Hyperlipidemia Aortic atherosclerosis CT head and neck 2025 History of stroke Vascular dementia [facility] resident DNR  September 2025:  Patient consents to use of AI scribe. The patient is 78 year old male with above listed medical problems here for recommendations regarding syncope.  The patient was admitted in May 2025 with presyncope.  Apparently he was in his normal state of health and was getting up from a chair and dropped his cup and became less alert.  He is found to be hypotensive in the emergency department.  His Imdur  and hydralazine  was discontinued.  He received antibiotics for 3 days for possible UTI.  Head imaging was negative.  An echocardiogram at that time demonstrated an EF of 45% without significant valvular issues.  He was referred for outpatient follow-up.  Since that time he has been admitted multiple times in the month of August.  He presented with slurred speech and a stroke was ruled out, fevers with a negative evaluation.  He was then admitted with community-acquired pneumonia treated with Augmentin .  At that point in time he was converted to DNR.  Since the last hospitalization, he has not experienced further episodes of blacking out or lightheadedness when transitioning from sitting to standing. He resides at [facility], where he spends his days walking and talking  with his caregiver.  No chest discomfort, breathing difficulties, or swelling in his legs. He is able to lie flat in bed without experiencing breathing problems.     Current Medications: Current Meds  Medication Sig   atorvastatin  (LIPITOR ) 40 MG tablet Take 1 tablet (40 mg total) by mouth daily. (Patient taking  differently: Take 40 mg by mouth at bedtime.)   carvedilol  (COREG ) 6.25 MG tablet Take 1 tablet (6.25 mg total) by mouth 2 (two) times daily with a meal.   cefdinir  (OMNICEF ) 300 MG capsule Take 1 capsule (300 mg total) by mouth 2 (two) times daily for 7 days.   clopidogrel  (PLAVIX ) 75 MG tablet Take 1 tablet (75 mg total) by mouth daily.   docusate sodium  (COLACE) 100 MG capsule Take 1 capsule (100 mg total) by mouth 2 (two) times daily.   finasteride  (PROSCAR ) 5 MG tablet Take 1 tablet (5 mg total) by mouth daily.   FLUoxetine  (PROZAC ) 20 MG capsule Take 20 mg by mouth daily.   folic acid  (FOLVITE ) 1 MG tablet Take 1 tablet (1 mg total) by mouth daily.   losartan  (COZAAR ) 25 MG tablet Take 1 tablet (25 mg total) by mouth at bedtime.   pantoprazole  (PROTONIX ) 40 MG tablet Take 1 tablet (40 mg total) by mouth daily.   polyethylene glycol (MIRALAX  / GLYCOLAX ) 17 g packet Take 17 g by mouth daily.   QUEtiapine  (SEROQUEL ) 50 MG tablet Take 1 tablet (50 mg total) by mouth at bedtime.   senna-docusate (SENOKOT-S) 8.6-50 MG tablet Take 1 tablet by mouth at bedtime as needed for mild constipation.   tamsulosin  (FLOMAX ) 0.4 MG CAPS capsule Take 1 capsule (0.4 mg total) by mouth daily.     Review of Systems:   Please see the history of present illness.    All other systems reviewed and are negative.     EKGs/Labs/Other Test Reviewed:   EKG: 2025 sinus rhythm LVH  EKG Interpretation Date/Time:    Ventricular Rate:    PR Interval:    QRS Duration:    QT Interval:    QTC Calculation:   R Axis:      Text Interpretation:          CARDIAC STUDIES: Refer to CV Procedures and Imaging Tabs   Risk Assessment/Calculations:          Physical Exam:   VS:  BP 136/70   Pulse 73   Ht 5' 5 (1.651 m)   Wt 133 lb 9.6 oz (60.6 kg)   SpO2 93%   BMI 22.23 kg/m        Wt Readings from Last 3 Encounters:  02/13/24 133 lb 9.6 oz (60.6 kg)  01/17/24 136 lb 7.4 oz (61.9 kg)  12/30/23 132 lb  15 oz (60.3 kg)      GENERAL:  No apparent distress, AOx3 HEENT:  No carotid bruits, +2 carotid impulses, no scleral icterus CAR: RRR no murmurs, gallops, rubs, or thrills RES:  Clear to auscultation bilaterally ABD:  Soft, nontender, nondistended, positive bowel sounds x 4 VASC:  +2 radial pulses, +2 carotid pulses NEURO:  CN 2-12 grossly intact; motor and sensory grossly intact; speech is slow PSYCH:  No active depression or anxiety EXT:  No edema, ecchymosis, or cyanosis  Signed, Prescious Hurless K Ailynn Gow, MD  02/13/2024 11:14 AM    [facility] [address] [place], [place], [place]  [address] Phone: [phone]; Fax: [phone]   Note:  This document was prepared  using Conservation officer, historic buildings and may include unintentional dictation errors.

## 2024-02-13 ENCOUNTER — Encounter: Payer: Self-pay | Admitting: Internal Medicine

## 2024-02-13 ENCOUNTER — Ambulatory Visit: Attending: Internal Medicine | Admitting: Internal Medicine

## 2024-02-13 VITALS — BP 136/70 | HR 73 | Ht 65.0 in | Wt 133.6 lb

## 2024-02-13 DIAGNOSIS — R55 Syncope and collapse: Secondary | ICD-10-CM

## 2024-02-13 DIAGNOSIS — Z66 Do not resuscitate: Secondary | ICD-10-CM

## 2024-02-13 DIAGNOSIS — I10 Essential (primary) hypertension: Secondary | ICD-10-CM

## 2024-02-13 DIAGNOSIS — R42 Dizziness and giddiness: Secondary | ICD-10-CM

## 2024-02-13 DIAGNOSIS — F03918 Unspecified dementia, unspecified severity, with other behavioral disturbance: Secondary | ICD-10-CM

## 2024-02-13 DIAGNOSIS — I429 Cardiomyopathy, unspecified: Secondary | ICD-10-CM | POA: Diagnosis not present

## 2024-02-13 DIAGNOSIS — I700 Atherosclerosis of aorta: Secondary | ICD-10-CM

## 2024-02-13 DIAGNOSIS — E785 Hyperlipidemia, unspecified: Secondary | ICD-10-CM

## 2024-02-13 DIAGNOSIS — I639 Cerebral infarction, unspecified: Secondary | ICD-10-CM

## 2024-02-13 DIAGNOSIS — I5189 Other ill-defined heart diseases: Secondary | ICD-10-CM | POA: Diagnosis not present

## 2024-02-13 MED ORDER — LOSARTAN POTASSIUM 25 MG PO TABS: 25.0000 mg | ORAL_TABLET | Freq: Every day | ORAL | 3 refills | Status: AC

## 2024-02-13 NOTE — Patient Instructions (Signed)
 Medication Instructions:  Your physician has recommended you make the following change in your medication:   1) START losartan  25 mg daily at bedtime  *If you need a refill on your cardiac medications before your next appointment, please call your pharmacy*  Lab Work: TODAY (lab on 1st floor): Lipid panel, LFTs, LP(a) If you have labs (blood work) drawn today and your tests are completely normal, you will receive your results only by: MyChart Message (if you have MyChart) OR A paper copy in the mail If you have any lab test that is abnormal or we need to change your treatment, we will call you to review the results.  Follow-Up: At [facility], you and your health needs are our priority.  As part of our continuing mission to provide you with exceptional heart care, our providers are all part of one team.  This team includes your primary Cardiologist (physician) and Advanced Practice Providers or APPs (Physician Assistants and Nurse Practitioners) who all work together to provide you with the care you need, when you need it.  Your next appointment:   6 month(s)  Provider:   One of our Advanced Practice Providers (APPs): Morse Clause, PA-C  Lamarr Satterfield, NP Miriam Shams, NP  Olivia Pavy, PA-C Josefa Beauvais, NP  Leontine Salen, PA-C Orren Fabry, PA-C  Hao Meng, PA-C Ernest Dick, NP  Damien Braver, NP Jon Hails, PA-C  Waddell Donath, PA-C    Dayna Dunn, PA-C  Scott Weaver, PA-C Lum Louis, NP Katlyn West, NP Callie Goodrich, PA-C  Xika Zhao, NP Sheng Haley, PA-C    Kathleen Ruthel Martine, PA-C   We recommend signing up for the patient portal called MyChart.  Sign up information is provided on this After Visit Summary.  MyChart is used to connect with patients for Virtual Visits (Telemedicine).  Patients are able to view lab/test results, encounter notes, upcoming appointments, etc.  Non-urgent messages can be sent to your provider as well.    To learn more about  what you can do with MyChart, go to [email or web address].

## 2024-02-16 ENCOUNTER — Ambulatory Visit: Payer: Self-pay | Admitting: Internal Medicine

## 2024-02-16 LAB — HEPATIC FUNCTION PANEL
ALT: 13 IU/L (ref 0–44)
AST: 16 IU/L (ref 0–40)
Albumin: 3.8 g/dL (ref 3.8–4.8)
Alkaline Phosphatase: 36 IU/L — ABNORMAL LOW (ref 47–123)
Bilirubin Total: 0.3 mg/dL (ref 0.0–1.2)
Bilirubin, Direct: 0.12 mg/dL (ref 0.00–0.40)
Total Protein: 6.7 g/dL (ref 6.0–8.5)

## 2024-02-16 LAB — LIPID PANEL
Chol/HDL Ratio: 3.6 ratio (ref 0.0–5.0)
Cholesterol, Total: 124 mg/dL (ref 100–199)
HDL: 34 mg/dL — ABNORMAL LOW (ref >39)
LDL Chol Calc (NIH): 67 mg/dL (ref 0–99)
Triglycerides: 131 mg/dL (ref 0–149)
VLDL Cholesterol Cal: 23 mg/dL (ref 5–40)

## 2024-02-16 LAB — LIPOPROTEIN A (LPA): Lipoprotein (a): <8.4 nmol/L (ref <75.0)

## 2024-04-13 ENCOUNTER — Telehealth: Payer: Self-pay | Admitting: Neurology

## 2024-04-13 NOTE — Telephone Encounter (Signed)
 Pt facility  called to reschedule appt appt for the 10 due to transportation   Appt Rescheduled

## 2024-04-26 ENCOUNTER — Inpatient Hospital Stay: Admitting: Neurology

## 2024-04-28 ENCOUNTER — Ambulatory Visit (INDEPENDENT_AMBULATORY_CARE_PROVIDER_SITE_OTHER): Admitting: Neurology

## 2024-04-28 ENCOUNTER — Encounter: Payer: Self-pay | Admitting: Neurology

## 2024-04-28 VITALS — BP 161/70 | HR 72 | Ht 63.0 in | Wt 143.0 lb

## 2024-04-28 DIAGNOSIS — R2990 Unspecified symptoms and signs involving the nervous system: Secondary | ICD-10-CM | POA: Diagnosis not present

## 2024-04-28 DIAGNOSIS — F0280 Dementia in other diseases classified elsewhere without behavioral disturbance: Secondary | ICD-10-CM | POA: Diagnosis not present

## 2024-04-28 DIAGNOSIS — F0150 Vascular dementia without behavioral disturbance: Secondary | ICD-10-CM | POA: Diagnosis not present

## 2024-04-28 DIAGNOSIS — G309 Alzheimer's disease, unspecified: Secondary | ICD-10-CM | POA: Diagnosis not present

## 2024-04-28 NOTE — Patient Instructions (Signed)
 I had a long d/w patient about his recent strokelike eisode, dementia, risk for recurrent stroke/TIAs, personally independently reviewed imaging studies and stroke evaluation results and answered questions.Continue Plavix  75 mg daily for secondary stroke prevention and maintain strict control of hypertension with blood pressure goal below 130/90, diabetes with hemoglobin A1c goal below 6.5% and lipids with LDL cholesterol goal below 70 mg/dL. I also advised the patient to eat a healthy diet with plenty of whole grains, cereals, fruits and vegetables, exercise regularly and maintain ideal body weight Followup in the future with me only as needed and no scheduled appointment was made.

## 2024-04-28 NOTE — Progress Notes (Signed)
 [facility] [address] [place]. [place] [address] [phone]       HOSPITAL FOLLOW UP NOTE  [name] Date of Birth:  [birth year] Medical Record Number:  [id number]   Reason for Referral:  hospital stroke follow up    SUBJECTIVE:   CHIEF COMPLAINT:  Chief Complaint  Patient presents with   RM17/STROKE    Pt is here with his Caretaker. Pt's caretaker states that pt has some dizzy spells but hasn't passed out.     HPI:   Dennis [name] is a 79 y.o. 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  LE Doppler negative for DVT.  LDL 105.  A1c 5.5.  Evaluated by cardiology, placed on Eliquis  until EF greater than 40 to 35% and recommended cardiac monitor to assess for A-fib, also increased home dose atorvastatin  from 40 mg to 80 mg daily.  Noted underlying dementia, psychiatry placed on fluoxetine  and Seroquel  for inappropriate behaviors.  Prolonged hospital stay due to awaiting guardianship of social services.  He was eventually discharged to SNF memory unit on 3/27 (54 day stay).   He returned to ED on 3/31 with altered mental status and difficulty with ambulation. CTH no acute intracranial abnormality.  Lab work overall reassuring.  Released back to facility after 6 hours.  Return again to ED on 4/29 for 3 days after episode of presyncope/AMS likely in setting of hypotension.  Blood pressure improved after IV fluids.  Also treated for possible UTI. CTH no acute findings.  Plan to gradually resume home antihypertensives.  Recommended consideration of EEG with any recurrent events (staff reported  patient dropped cup at breakfast and became less alert, staring/dazed, not acting like himself, full episode lasting 1 minute).  Repeat echo showed EF of 45% therefore Eliquis  discontinued and initiated aspirin  and advised outpatient cardiology follow-up.    Today, 10/21/2023, patient is being seen for hospital follow up accompanied by SNF staff who provides most history as patient poor historian.  Continues to reside at [facility] memory care under guardianship. No new stroke/TIA symptoms. Continued cognitive impairment which can fluctuate with episodes of confusion and sometimes speech difficulty. He remains on Seroquel  and Prozac , mood overall stable. He does endorse frustration regarding not being able to remember certain things. 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 He is able to feed himself but needs assistance with all other ADLs.  Ambulates without AD, no recent falls.  Usually continent of both bladder and bowel.  Remains on both aspirin  and atorvastatin  without side effects.  Has not yet had follow-up with cardiology although updated referral placed on 5/19 to heart care at [facility] [address] but per staff, has not yet been called to scheduled. No questions or concerns at this time.   Update 04/28/2024 : He returns for follow-up today after last visit with [facility] nurse practitioner 6 months ago.  Patient was admitted for evaluation for stroke on 12/28/2023.  She presented as a code stroke for evaluation for confusion, expressive aphasia and difficulty walking.  He was living in a nursing home and found to be confused and not talking comprehensively.  His  speech was garbled.  NIH stroke scale was 8.  MRI brain was negative for stroke.  MR angiogram shows no large vessel stenosis or occlusion.  Carotid ultrasound showed no significant extracranial stenosis.  Hemoglobin A1c was 5.4.  LDL cholesterol 55 mg percent.  Echocardiogram showed ejection fraction 45 to 50% with mild concentric  left ventricular hypertrophy.  Left atrial was mildly dilated.  Patient was felt to have a strokelike episode and started on aspirin  and Plavix  for 3 weeks followed by Plavix  alone.  He has had no further recurrent strokelike stroke.  He continues to have significant cognitive impairment with patient was not cooperative for detailed cognitive testing today.  PERTINENT IMAGING  Code Stroke CT head No acute abnormality.  MRI  Punctate acute infarct in the left posterior subinsular white matter.  Left frontal linear cortical infarct, subacute right frontal punctate infarct. 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 2D Echo 06/21/2023: LVEF 20 to 25%, global hypokinesis, mild MVR, mild AVR, moderate aortic valve thickening, moderate aortic valve calcification 2D echo 09/18/2023: EF 45% LE venous Doppler no DVT LDL 105 HgbA1c 5.5    ROS:   14 system review of systems performed and negative with exception of those listed in HPI  PMH:  Past Medical History:  Diagnosis Date   Arthritis    Dementia (HCC)    Hypertension     PSH:  Past Surgical History:  Procedure Laterality Date   CHOLECYSTECTOMY     THROAT SURGERY      Social History:  Social History   Socioeconomic History   Marital status: Single    Spouse name: Not on file   Number of children: Not on file   Years of education: Not on file   Highest education level: Not on file  Occupational History   Not on file  Tobacco Use   Smoking status: Former    Types: Cigarettes   Smokeless tobacco: Never  Vaping Use   Vaping status: Never Used  Substance and Sexual Activity   Alcohol  use: No   Drug use: No   Sexual activity: Not on file  Other Topics Concern   Not on file  Social History Narrative   Not on file   Social Drivers of Health   Financial Resource Strain: Not on file  Food Insecurity: No Food  Insecurity (01/29/2024)   Hunger Vital Sign    Worried About Running Out of Food in the Last Year: Never true    Ran Out of Food in the Last Year: Never true  Transportation Needs: No Transportation Needs (01/29/2024)   PRAPARE - Administrator, Civil Service (Medical): No    Lack of Transportation (Non-Medical): No  Physical Activity: Not on file  Stress: Not on file  Social Connections: Moderately Isolated (09/17/2023)   Social Connection and Isolation Panel    Frequency of Communication with Friends and Family: More than three times a week    Frequency of Social Gatherings with Friends and Family: More than three times a week    Attends Religious Services: More than 4 times per year    Active Member of [employer] or Organizations: No    Attends Banker Meetings: Never    Marital Status: Divorced  Catering Manager Violence: Not At Risk (01/29/2024)   Humiliation, Afraid, Rape, and Kick questionnaire    Fear of Current or Ex-Partner:  No    Emotionally Abused: No    Physically Abused: No    Sexually Abused: No    Family History: History reviewed. No pertinent family history.  Medications:   Current Outpatient Medications on File Prior to Visit  Medication Sig Dispense Refill   atorvastatin  (LIPITOR ) 40 MG tablet Take 1 tablet (40 mg total) by mouth daily. (Patient taking differently: Take 40 mg by mouth at bedtime.) 30 tablet 2   carvedilol  (COREG ) 6.25 MG tablet Take 1 tablet (6.25 mg total) by mouth 2 (two) times daily with a meal.     clopidogrel  (PLAVIX ) 75 MG tablet Take 1 tablet (75 mg total) by mouth daily. 90 tablet 3   docusate sodium  (COLACE) 100 MG capsule Take 1 capsule (100 mg total) by mouth 2 (two) times daily. 10 capsule 0   finasteride  (PROSCAR ) 5 MG tablet Take 1 tablet (5 mg total) by mouth daily. 30 tablet 2   FLUoxetine  (PROZAC ) 20 MG capsule Take 20 mg by mouth daily.     folic acid  (FOLVITE ) 1 MG tablet Take 1 tablet (1 mg total) by mouth  daily. 30 tablet 2   losartan  (COZAAR ) 25 MG tablet Take 1 tablet (25 mg total) by mouth at bedtime. 90 tablet 3   pantoprazole  (PROTONIX ) 40 MG tablet Take 1 tablet (40 mg total) by mouth daily. 90 tablet 1   polyethylene glycol (MIRALAX  / GLYCOLAX ) 17 g packet Take 17 g by mouth daily.     QUEtiapine  (SEROQUEL ) 50 MG tablet Take 1 tablet (50 mg total) by mouth at bedtime.     senna-docusate (SENOKOT-S) 8.6-50 MG tablet Take 1 tablet by mouth at bedtime as needed for mild constipation. 15 tablet 0   tamsulosin  (FLOMAX ) 0.4 MG CAPS capsule Take 1 capsule (0.4 mg total) by mouth daily. 30 capsule 2   No current facility-administered medications on file prior to visit.    Allergies:   Allergies  Allergen Reactions   Codeine Other (See Comments)    Unknown reaction   Shellfish Allergy Other (See Comments)    Unknown reaction      OBJECTIVE:  Physical Exam  Vitals:   04/28/24 1310  BP: (!) 161/70  Pulse: 72  SpO2: 96%  Weight: 143 lb (64.9 kg)  Height: 5' 3 (1.6 m)   Body mass index is 25.33 kg/m. No results found.   General: well developed, well nourished, very pleasant elderly Caucasian male, seated, in no evident distress  Neurologic Exam Mental Status: Awake and fully alert. mild dysarthria although poor denture noted. Able to state name and birthday, disoriented to place and time. Recent and remote memory impaired. Attention span, concentration and fund of knowledge impaired. Mood and affect appropriate and cooperative with Mini-Mental exam distractibility and tangential likely Cranial Nerves: Fundoscopic exam not done pupils equal, briskly reactive to light. Extraocular movements full without nystagmus. Visual fields full to confrontation. Hearing intact. Facial sensation intact. Face, tongue, palate moves normally and symmetrically.  Motor: Normal bulk and tone. Normal strength in all tested extremity muscles Sensory.: intact to touch , pinprick , position and  vibratory sensation.  Coordination: Rapid alternating movements normal in all extremities. Finger-to-nose and heel-to-shin performed accurately bilaterally. Gait and Station: Arises from chair without difficulty. Stance is hunched. Gait demonstrates antalgic gait without use of AD Reflexes: 1+ and symmetric. Toes downgoing.     NIHSS  1 Modified Rankin  3      ASSESSMENT: Dennis [name] is a 78 y.o. 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 Vascular risk factors include cardiomyopathy, carotid stenosis and R VA stenosis, HTN, HLD and suspected advanced dementia.  Recent admission in August 2025 for strokelike episode with negative neurovascular workup.    PLAN:   I had a long d/w patient about his recent strokelike eisode, dementia, risk for recurrent stroke/TIAs, personally independently reviewed imaging studies and stroke evaluation results and answered questions.Continue Plavix  75 mg daily for secondary stroke prevention and maintain strict control of hypertension with blood pressure goal below 130/90, diabetes with hemoglobin A1c goal below 6.5% and lipids with LDL cholesterol goal below 70 mg/dL. I also advised the patient to eat a healthy diet with plenty of whole grains, cereals, fruits and vegetables, exercise regularly and maintain ideal body weight Followup in the future with me only as needed and no scheduled appointment was made..   I personally spent a total of 35 minutes in the care of the patient today including getting/reviewing separately obtained history, performing a medically appropriate exam/evaluation, counseling and educating, placing orders, referring and communicating with other health care professionals, documenting clinical information in the EHR, independently interpreting results, and coordinating care.         Eather Popp, MD [facility] [address] [place], [place] [address]  Phone [phone] Fax [phone] Note: This document was prepared with digital dictation and possible smart phrase technology. Any transcriptional errors that result from this process are unintentional.

## 2024-05-01 ENCOUNTER — Emergency Department (HOSPITAL_COMMUNITY): Admission: EM | Admit: 2024-05-01 | Discharge: 2024-05-01 | Disposition: A | Discharge status: Skilled Nursing Facility

## 2024-05-01 ENCOUNTER — Other Ambulatory Visit: Payer: Self-pay

## 2024-05-01 ENCOUNTER — Emergency Department (HOSPITAL_COMMUNITY)

## 2024-05-01 DIAGNOSIS — W01198A Fall on same level from slipping, tripping and stumbling with subsequent striking against other object, initial encounter: Secondary | ICD-10-CM | POA: Diagnosis not present

## 2024-05-01 DIAGNOSIS — W19XXXA Unspecified fall, initial encounter: Secondary | ICD-10-CM

## 2024-05-01 DIAGNOSIS — Z7902 Long term (current) use of antithrombotics/antiplatelets: Secondary | ICD-10-CM | POA: Diagnosis not present

## 2024-05-01 DIAGNOSIS — I491 Atrial premature depolarization: Secondary | ICD-10-CM | POA: Insufficient documentation

## 2024-05-01 DIAGNOSIS — S060X0A Concussion without loss of consciousness, initial encounter: Secondary | ICD-10-CM | POA: Diagnosis present

## 2024-05-01 DIAGNOSIS — Y92129 Unspecified place in nursing home as the place of occurrence of the external cause: Secondary | ICD-10-CM | POA: Diagnosis not present

## 2024-05-01 DIAGNOSIS — F0390 Unspecified dementia without behavioral disturbance: Secondary | ICD-10-CM | POA: Insufficient documentation

## 2024-05-01 LAB — COMPREHENSIVE METABOLIC PANEL WITH GFR
ALT: 13 U/L (ref 0–44)
AST: 21 U/L (ref 15–41)
Albumin: 3.3 g/dL — ABNORMAL LOW (ref 3.5–5.0)
Alkaline Phosphatase: 30 U/L — ABNORMAL LOW (ref 38–126)
Anion gap: 4 — ABNORMAL LOW (ref 5–15)
BUN: 20 mg/dL (ref 8–23)
CO2: 23 mmol/L (ref 22–32)
Calcium: 9.1 mg/dL (ref 8.9–10.3)
Chloride: 111 mmol/L (ref 98–111)
Creatinine, Ser: 1.01 mg/dL (ref 0.61–1.24)
GFR, Estimated: >60 mL/min (ref >60)
Glucose, Bld: 89 mg/dL (ref 70–99)
Potassium: 4.3 mmol/L (ref 3.5–5.1)
Sodium: 138 mmol/L (ref 135–145)
Total Bilirubin: 0.4 mg/dL (ref 0.0–1.2)
Total Protein: 6.8 g/dL (ref 6.5–8.1)

## 2024-05-01 LAB — PROTIME-INR
INR: 1 (ref 0.8–1.2)
Prothrombin Time: 14 s (ref 11.4–15.2)

## 2024-05-01 LAB — URINALYSIS, ROUTINE W REFLEX MICROSCOPIC
Bacteria, UA: NONE SEEN
Bilirubin Urine: NEGATIVE
Glucose, UA: NEGATIVE mg/dL
Hgb urine dipstick: NEGATIVE
Ketones, ur: NEGATIVE mg/dL
Leukocytes,Ua: NEGATIVE
Nitrite: NEGATIVE
Protein, ur: 30 mg/dL — AB
Specific Gravity, Urine: 1.017 (ref 1.005–1.030)
pH: 5 (ref 5.0–8.0)

## 2024-05-01 LAB — CBC
HCT: 41.7 % (ref 39.0–52.0)
Hemoglobin: 13 g/dL (ref 13.0–17.0)
MCH: 28.4 pg (ref 26.0–34.0)
MCHC: 31.2 g/dL (ref 30.0–36.0)
MCV: 91.2 fL (ref 80.0–100.0)
Platelets: 190 K/uL (ref 150–400)
RBC: 4.57 MIL/uL (ref 4.22–5.81)
RDW: 15.1 % (ref 11.5–15.5)
WBC: 8.2 K/uL (ref 4.0–10.5)
nRBC: 0 % (ref 0.0–0.2)

## 2024-05-01 LAB — I-STAT CHEM 8, ED
BUN: 26 mg/dL — ABNORMAL HIGH (ref 8–23)
Calcium, Ion: 1.06 mmol/L — ABNORMAL LOW (ref 1.15–1.40)
Chloride: 109 mmol/L (ref 98–111)
Creatinine, Ser: 1.1 mg/dL (ref 0.61–1.24)
Glucose, Bld: 89 mg/dL (ref 70–99)
HCT: 40 % (ref 39.0–52.0)
Hemoglobin: 13.6 g/dL (ref 13.0–17.0)
Potassium: 4.3 mmol/L (ref 3.5–5.1)
Sodium: 143 mmol/L (ref 135–145)
TCO2: 24 mmol/L (ref 22–32)

## 2024-05-01 LAB — I-STAT CG4 LACTIC ACID, ED: Lactic Acid, Venous: 0.6 mmol/L (ref 0.5–1.9)

## 2024-05-01 LAB — SAMPLE TO BLOOD BANK

## 2024-05-01 MED ORDER — ACETAMINOPHEN 325 MG PO TABS
650.0000 mg | ORAL_TABLET | Freq: Once | ORAL | Status: AC
  Administered 2024-05-01: 650 mg via ORAL
  Filled 2024-05-01: qty 2

## 2024-05-01 MED ORDER — LORAZEPAM 1 MG PO TABS: 0.5000 mg | ORAL_TABLET | Freq: Once | ORAL | Status: DC | PRN

## 2024-05-01 NOTE — ED Notes (Signed)
 Pt resting with eyes closed; respirations spontaneous, even, unlabored

## 2024-05-01 NOTE — ED Notes (Signed)
 Attempted to notify legal guardian Blondie Subbs) of pt discharge; no answer

## 2024-05-01 NOTE — ED Provider Notes (Signed)
 [place] EMERGENCY DEPARTMENT AT [facility] Provider Note   CSN: [id number] Arrival date & time: 05/01/24  9341     Patient presents with: No chief complaint on file.   Dennis [name] is a 79 y.o. male.   This is a 78 year old male presenting emergency department from [facility] memory care unit.  Has a history of dementia.  Reportedly fell sometime yesterday evening which she has told staff this morning.  Complaining of headache and was having some balance issues and his speech sounded slurred to the staff.  He does take Plavix .  EMS reports that he is at his mental baseline currently.  He denies any pain.        Prior to Admission medications  Medication Sig Start Date End Date Taking? Authorizing Provider  atorvastatin  (LIPITOR ) 40 MG tablet Take 1 tablet (40 mg total) by mouth daily. Patient taking differently: Take 40 mg by mouth at bedtime. 03/16/23   Ghimire, Donalda HERO, MD  carvedilol  (COREG ) 6.25 MG tablet Take 1 tablet (6.25 mg total) by mouth 2 (two) times daily with a meal. 02/02/24   Verdene Purchase, MD  clopidogrel  (PLAVIX ) 75 MG tablet Take 1 tablet (75 mg total) by mouth daily. 12/31/23   Mdala-Gausi, Masiku Agatha, MD  docusate sodium  (COLACE) 100 MG capsule Take 1 capsule (100 mg total) by mouth 2 (two) times daily. 08/05/23   Darci Pore, MD  finasteride  (PROSCAR ) 5 MG tablet Take 1 tablet (5 mg total) by mouth daily. 03/16/23   Ghimire, Donalda HERO, MD  FLUoxetine  (PROZAC ) 20 MG capsule Take 20 mg by mouth daily.    [provider]  folic acid  (FOLVITE ) 1 MG tablet Take 1 tablet (1 mg total) by mouth daily. 03/16/23   Ghimire, Donalda HERO, MD  losartan  (COZAAR ) 25 MG tablet Take 1 tablet (25 mg total) by mouth at bedtime. 02/13/24   Thukkani, Arun K, MD  pantoprazole  (PROTONIX ) 40 MG tablet Take 1 tablet (40 mg total) by mouth daily. 08/06/23   Darci Pore, MD  polyethylene glycol (MIRALAX  / GLYCOLAX ) 17 g packet Take 17 g by  mouth daily. 02/02/24   Krishnan, Gokul, MD  QUEtiapine  (SEROQUEL ) 50 MG tablet Take 1 tablet (50 mg total) by mouth at bedtime. 12/30/23   Mdala-Gausi, Masiku Agatha, MD  senna-docusate (SENOKOT-S) 8.6-50 MG tablet Take 1 tablet by mouth at bedtime as needed for mild constipation. 08/05/23   Darci Pore, MD  tamsulosin  (FLOMAX ) 0.4 MG CAPS capsule Take 1 capsule (0.4 mg total) by mouth daily. 03/16/23   Ghimire, Donalda HERO, MD    Allergies: Codeine and Shellfish allergy    Review of Systems  Updated Vital Signs BP (!) 146/63   Pulse (!) 55   Temp 97.7 F (36.5 C) (Oral)   Resp 14   Ht 5' 3 (1.6 m)   Wt 64.9 kg   SpO2 96%   BMI 25.33 kg/m   Physical Exam Vitals and nursing note reviewed.  Constitutional:      General: He is not in acute distress. HENT:     Head: Normocephalic and atraumatic.     Mouth/Throat:     Mouth: Mucous membranes are moist.  Eyes:     Conjunctiva/sclera: Conjunctivae normal.     Pupils: Pupils are equal, round, and reactive to light.  Cardiovascular:     Rate and Rhythm: Normal rate and regular rhythm.  Pulmonary:     Effort: Pulmonary effort is normal.     Breath  sounds: Normal breath sounds.  Abdominal:     General: Abdomen is flat. There is no distension.     Palpations: Abdomen is soft.     Tenderness: There is no abdominal tenderness. There is no guarding or rebound.  Musculoskeletal:     Right lower leg: No edema.     Left lower leg: No edema.  Skin:    General: Skin is warm and dry.     Capillary Refill: Capillary refill takes less than 2 seconds.  Neurological:     Mental Status: He is alert and oriented to person, place, and time.     Comments: Do not appreciate facial asymmetry.  Normal sensation.  Equal strength.  Psychiatric:        Mood and Affect: Mood normal.        Behavior: Behavior normal.     (all labs ordered are listed, but only abnormal results are displayed) Labs Reviewed  COMPREHENSIVE METABOLIC PANEL  WITH GFR - Abnormal; Notable for the following components:      Result Value   Albumin 3.3 (*)    Alkaline Phosphatase 30 (*)    Anion gap 4 (*)    All other components within normal limits  URINALYSIS, ROUTINE W REFLEX MICROSCOPIC - Abnormal; Notable for the following components:   Protein, ur 30 (*)    All other components within normal limits  I-STAT CHEM 8, ED - Abnormal; Notable for the following components:   BUN 26 (*)    Calcium , Ion 1.06 (*)    All other components within normal limits  CBC  PROTIME-INR  I-STAT CG4 LACTIC ACID, ED  SAMPLE TO BLOOD BANK    EKG: EKG Interpretation Date/Time:  Saturday May 01 2024 07:06:53 EST Ventricular Rate:  63 PR Interval:  138 QRS Duration:  86 QT Interval:  405 QTC Calculation: 415 R Axis:   65  Text Interpretation: Sinus rhythm Atrial premature complex No significant change since last tracing Confirmed by Neysa Clap [phone]) on 05/01/2024 7:33:22 AM  Radiology: MR BRAIN WO CONTRAST Result Date: 05/01/2024 EXAM: MRI BRAIN WITHOUT CONTRAST 05/01/2024 11:17:24 AM TECHNIQUE: Multiplanar multisequence MRI of the head/brain was performed without the administration of intravenous contrast. COMPARISON: Head CT 05/01/2024 and MRI 12/28/2023. CT head venogram 12/28/2023. CLINICAL HISTORY: Transient ischemic attack (TIA). Fall, stumbling, and slurred speech. FINDINGS: BRAIN AND VENTRICLES: No acute infarct, mass, midline shift, hydrocephalus, or extra-axial fluid collection is identified. There is moderate cerebral atrophy. Patchy and confluent T2 hyperintensities in the cerebral white matter bilaterally are similar to the prior MRI and are nonspecific but compatible with extensive chronic small vessel ischemic disease. 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 An absent normal flow void is again noted in the right transverse and sigmoid sinuses, compatible with slow flow given  normal appearance on the previous CT venogram. The sella is unremarkable. ORBITS: No acute abnormality. SINUSES AND MASTOIDS: Right maxillary sinus mucous retention cyst. Clear mastoid air cells. BONES AND SOFT TISSUES: Normal marrow signal. No acute soft tissue abnormality. IMPRESSION: 1. No acute intracranial abnormality. 2. Extensive chronic small vessel ischemic disease. Electronically signed by: Dasie Hamburg MD 05/01/2024 11:28 AM EST RP Workstation: [id number]   CT HEAD WO CONTRAST Result Date: 05/01/2024 EXAM: CT HEAD AND CERVICAL SPINE 05/01/2024 07:34:34 AM TECHNIQUE: CT of the head and cervical spine was performed without the administration of intravenous contrast. Multiplanar reformatted images are provided for review. Automated exposure control, iterative reconstruction, and/or  weight based adjustment of the mA/kV was utilized to reduce the radiation dose to as low as reasonably achievable. COMPARISON: CT head and CTA head and neck, both 12/28/2023. CLINICAL HISTORY: Head trauma, moderate-severe. Blunt polytrauma, fall injury on blood thinners, 78 year old. FINDINGS: CT HEAD BRAIN AND VENTRICLES: No acute intracranial hemorrhage. No mass effect or midline shift. No abnormal extra-axial fluid collection. No evidence of acute infarct. 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 There is moderately advanced cerebral atrophy with atrophic ventriculomegaly and moderate to severe small vessel disease of the cerebral white matter. Basal cisterns are clear. The carotid siphons and distal left vertebral artery are heavily calcified. No hyperdense vessel is seen. ORBITS: No acute abnormality. SINUSES AND MASTOIDS: There is a 2.2 cm retention cyst in the floor of the right maxillary sinus. Other paranasal sinuses, the bilateral mastoid air cells, and middle ear cavities are clear. SOFT TISSUES AND SKULL: No acute skull fracture. There is a right parietal scalp contusion. There are no depressed  skull fractures. CT CERVICAL SPINE LIMITATIONS: The cervical spine study is compromised by motion artifact. BONES AND ALIGNMENT: No acute fracture or traumatic malalignment. No obvious fracture is seen through the motion artifact. Minimal chronic degenerative grade 1 anterolisthesis C3-C4, C6-C7, and C7-T1. No traumatic or further listhesis. Bone on bone anterior atlanto-dental joint space loss and osteophytosis are again shown. There is osteopenia. There is a mild cervical dextroscoliosis. DEGENERATIVE CHANGES: Chronic interbody and facet joint ankylosis at C4-C5. Chronic collapsed discs and bidirectional endplate osteophytes at C5-C6 and C6-C7. Left greater than right facet joint arthropathy and multilevel uncinate spurring is present with severe left foraminal stenosis at C3-C4 and C4-C5 and bilateral moderate to severe foraminal stenosis at C5-C6, moderate foraminal stenosis at C6-C7. No high-grade spinal canal stenosis is seen. SOFT TISSUES: No prevertebral soft tissue swelling. There is no spinal canal hematoma. Both proximal cervical ICAs are heavily calcified but this was better seen on the CTA. There is no thyroid  or laryngeal mass. LUNG APICES: Lung apices are emphysematous but clear. No apical pneumothorax. IMPRESSION: 1. No acute intracranial CT findings or  depressed skull fractures. 2. No acute fracture or traumatic malalignment of the cervical spine; evaluation limited by motion artifact. 3. Right parietal scalp contusion without depressed skull fracture. 4. No apical pneumothorax. 5. Pulmonary emphysema. 6. Carotid atherosclerosis . 7. Osteopenia And degenerative change. Electronically signed by: Francis Quam MD 05/01/2024 07:59 AM EST RP Workstation: [id number]   CT CERVICAL SPINE WO CONTRAST Result Date: 05/01/2024 EXAM: CT HEAD AND CERVICAL SPINE 05/01/2024 07:34:34 AM TECHNIQUE: CT of the head and cervical spine was performed without the administration of intravenous contrast. Multiplanar  reformatted images are provided for review. Automated exposure control, iterative reconstruction, and/or weight based adjustment of the mA/kV was utilized to reduce the radiation dose to as low as reasonably achievable. COMPARISON: CT head and CTA head and neck, both 12/28/2023. CLINICAL HISTORY: Head trauma, moderate-severe. Blunt polytrauma, fall injury on blood thinners, 78 year old. FINDINGS: CT HEAD BRAIN AND VENTRICLES: No acute intracranial hemorrhage. No mass effect or midline shift. No abnormal extra-axial fluid collection. No evidence of acute infarct. 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 There is moderately advanced cerebral atrophy with atrophic ventriculomegaly and moderate to severe small vessel disease of the cerebral white matter. Basal cisterns are clear. The carotid siphons and distal left vertebral artery are heavily calcified. No hyperdense vessel is seen. ORBITS: No acute abnormality. SINUSES AND MASTOIDS: There is a 2.2 cm retention  cyst in the floor of the right maxillary sinus. Other paranasal sinuses, the bilateral mastoid air cells, and middle ear cavities are clear. SOFT TISSUES AND SKULL: No acute skull fracture. There is a right parietal scalp contusion. There are no depressed skull fractures. CT CERVICAL SPINE LIMITATIONS: The cervical spine study is compromised by motion artifact. BONES AND ALIGNMENT: No acute fracture or traumatic malalignment. No obvious fracture is seen through the motion artifact. Minimal chronic degenerative grade 1 anterolisthesis C3-C4, C6-C7, and C7-T1. No traumatic or further listhesis. Bone on bone anterior atlanto-dental joint space loss and osteophytosis are again shown. There is osteopenia. There is a mild cervical dextroscoliosis. DEGENERATIVE CHANGES: Chronic interbody and facet joint ankylosis at C4-C5. Chronic collapsed discs and bidirectional endplate osteophytes at C5-C6 and C6-C7. Left greater than right facet joint arthropathy  and multilevel uncinate spurring is present with severe left foraminal stenosis at C3-C4 and C4-C5 and bilateral moderate to severe foraminal stenosis at C5-C6, moderate foraminal stenosis at C6-C7. No high-grade spinal canal stenosis is seen. SOFT TISSUES: No prevertebral soft tissue swelling. There is no spinal canal hematoma. Both proximal cervical ICAs are heavily calcified but this was better seen on the CTA. There is no thyroid  or laryngeal mass. LUNG APICES: Lung apices are emphysematous but clear. No apical pneumothorax. IMPRESSION: 1. No acute intracranial CT findings or  depressed skull fractures. 2. No acute fracture or traumatic malalignment of the cervical spine; evaluation limited by motion artifact. 3. Right parietal scalp contusion without depressed skull fracture. 4. No apical pneumothorax. 5. Pulmonary emphysema. 6. Carotid atherosclerosis . 7. Osteopenia And degenerative change. Electronically signed by: Francis Quam MD 05/01/2024 07:59 AM EST RP Workstation: [id number]   DG Pelvis Portable Result Date: 05/01/2024 EXAM: 2 VIEW(S) XRAY OF THE PELVIS 05/01/2024 07:26:00 AM COMPARISON: None available. CLINICAL HISTORY: 78 year old male. Trauma. FINDINGS: BONES AND JOINTS: No acute fracture. No malalignment. Moderate pubic symphysis degeneration. SOFT TISSUES: Previous lower abdominal hernia repair with mesh. Negative visible bowel gas pattern. IMPRESSION: 1. No acute fracture or dislocation identified about the pelvis. Electronically signed by: Helayne Hurst MD 05/01/2024 07:33 AM EST RP Workstation: [id number]   DG Chest Port 1 View Result Date: 05/01/2024 EXAM: 1 VIEW(S) XRAY OF THE CHEST 05/01/2024 07:26:00 AM COMPARISON: 01/28/2024 CLINICAL HISTORY: 78 year old male. Trauma. FINDINGS: LUNGS AND PLEURA: Stable mildly nodular bilateral upper lung opacity, not correlated with suspicious findings on september CTA of the chest. No acute or confluent lung opacity. No pleural effusion. No  pneumothorax. HEART AND MEDIASTINUM: calcification aortic atherosclerosis. No acute abnormality of the cardiac and mediastinal silhouettes. BONES AND SOFT TISSUES: No acute osseous abnormality. VISIBLE ABDOMEN: Paucity of bowel gas in the visible abdomen. IMPRESSION: 1. No acute cardiopulmonary abnormality. 2. Aortic atherosclerosis. Electronically signed by: Helayne Hurst MD 05/01/2024 07:32 AM EST RP Workstation: [id number]     Procedures   Medications Ordered in the ED  acetaminophen  (TYLENOL ) tablet 650 mg (650 mg Oral Given 05/01/24 0856)    Clinical Course as of 05/01/24 1534  Sat May 01, 2024  0722 Admitted 8/10 for slurred speech. Per d/c summary: Slurred speech Concern for stroke. Patient was seen by neurology. MRI showed no acute intracranial abnormality. CTA showed some atherosclerotic changes in the proximal right and left cervical ICA, prompting further evaluation of vasculature. CT venogram of the head was negative for dural venous sinus thrombosis. MR angiogram was negative.  No large vessel occlusion or hemodynamically significant stenosis. EEG was done and showed mild to moderate diffuse encephalopathy with  no seizures or epileptiform discharges. TTE done, read pending at discharge.  Patient has been started on Plavix . He is to continue taking aspirin  and plavix  for 3 weeks, and then discontinue aspirin  and continue with plavix .  Neurology recommended outpatient follow-up in clinic.   Acute encephalopathy Unclear etiology. No evidence of UTI. Chest x-ray not suggestive of pneumonia. Ethanol negative.  TSH within normal limits.  B12 within normal limits.  UDS pending. Mental status now back to baseline. Home Seroquel  was resumed at lower dose. Home cimetidine was discontinued, as this has been associated with episodes of confusion in older adults.   [TY]  1213 MR BRAIN WO CONTRAST IMPRESSION: 1. No acute intracranial abnormality. 2. Extensive chronic small vessel  ischemic disease.   [TY]    Clinical Course User Index [TY] Neysa Caron PARAS, DO                                 Medical Decision Making This is a 78 year old male presenting emergency department for evaluation after a fall possible with possible slurred speech.  Reported to be at mental baseline per EMS.  Was hypertensive 206/110 with EMS, but blood pressure has significantly improved 146/63 currently.  Physical exam, has clear speech to me, and no other focal neurodeficits. Alert to person and place, but not time. Imaging negative for acute traumatic injury or or acute stroke on MRI.  X-rays without fractures.  No leukocytosis to suggest systemic infection.  No anemia.  No metabolic derangements.  No transaminitis to suggest hepatic encephalopathy.  UA without evidence of urinary tract infection.  Lactate normal.  Patient reports that his speech sounds normal to him.  I did order MRI out of caution although my suspicion for acute stroke low.  It was also negative for acute pathology.  It does sound like he had a similar episode in August and had a extensive workup.  Regarding his visit today, with no acute findings on imaging, reassuring labs and vital signs and at his reported baseline that we could likely discharge, as long as he is able to ambulate with steady gait.  See ED course for final MDM.  Amount and/or Complexity of Data Reviewed Independent Historian: EMS External Data Reviewed:     Details: Not on blood thinner; see ed course for prior admission note Labs: ordered.    Details: See above.  Radiology: ordered and independent interpretation performed. Decision-making details documented in ED Course.    Details: Do not appreciate obvious intracranial hemorrhage.  Chest x-ray without pneumonia pneumothorax ECG/medicine tests: independent interpretation performed.    Details: Similar to prior  Risk OTC drugs. Prescription drug management. Decision regarding  hospitalization. Diagnosis or treatment significantly limited by social determinants of health. Risk Details: Dementia, from facility       Final diagnoses:  Fall, initial encounter  Concussion without loss of consciousness, initial encounter    ED Discharge Orders     None          Neysa Caron PARAS, DO 05/01/24 1534

## 2024-05-01 NOTE — ED Notes (Signed)
 Pt returned from MRI

## 2024-05-01 NOTE — Discharge Instructions (Signed)
 Please follow-up with your primary doctor.  Return for fevers, chills, vision loss, facial droop, unilateral weakness, difficulty finding words, chest pain, shortness of breath, severe abdominal pain or any new or worsening symptoms that are concerning to you.

## 2024-05-01 NOTE — ED Notes (Signed)
 Pt eating lunch

## 2024-05-01 NOTE — ED Triage Notes (Signed)
 BIB [facility] from [facility] Memory care unit. Fall yesterday, hit head, staff reports that stumbling and slurred speech. Pt takes plavix   BP 206/110, 13/74 HR 56 CBG 122 Sp02 93 RA, 97% on 2 lpm via [place] 20 LFA

## 2024-05-01 NOTE — ED Notes (Signed)
Ordered lunch tray 

## 2024-05-01 NOTE — ED Notes (Signed)
 Called [facility] for pt pick at 12:30 and again 15:39 no eta  given.

## 2024-05-01 NOTE — ED Notes (Signed)
 It ambulatory with steady gait using walker and stand by assist

## 2024-05-01 NOTE — ED Notes (Signed)
 Attempted to contacts pt's legal guardian; no answer; left VM encouraging guardian to return phone call

## 2024-05-18 ENCOUNTER — Emergency Department (HOSPITAL_COMMUNITY)
Admission: EM | Admit: 2024-05-18 | Discharge: 2024-05-18 | Disposition: A | Discharge status: Home / Self Care | Attending: Emergency Medicine | Admitting: Emergency Medicine

## 2024-05-18 ENCOUNTER — Emergency Department (HOSPITAL_COMMUNITY)

## 2024-05-18 DIAGNOSIS — R0602 Shortness of breath: Secondary | ICD-10-CM | POA: Diagnosis not present

## 2024-05-18 DIAGNOSIS — I10 Essential (primary) hypertension: Secondary | ICD-10-CM | POA: Insufficient documentation

## 2024-05-18 DIAGNOSIS — Z79899 Other long term (current) drug therapy: Secondary | ICD-10-CM | POA: Insufficient documentation

## 2024-05-18 DIAGNOSIS — F0390 Unspecified dementia without behavioral disturbance: Secondary | ICD-10-CM | POA: Insufficient documentation

## 2024-05-18 DIAGNOSIS — Z7902 Long term (current) use of antithrombotics/antiplatelets: Secondary | ICD-10-CM | POA: Diagnosis not present

## 2024-05-18 DIAGNOSIS — R059 Cough, unspecified: Secondary | ICD-10-CM | POA: Diagnosis present

## 2024-05-18 DIAGNOSIS — R0789 Other chest pain: Secondary | ICD-10-CM | POA: Insufficient documentation

## 2024-05-18 DIAGNOSIS — Z8616 Personal history of COVID-19: Secondary | ICD-10-CM | POA: Insufficient documentation

## 2024-05-18 LAB — CBC
HCT: 49.2 % (ref 39.0–52.0)
Hemoglobin: 14.6 g/dL (ref 13.0–17.0)
MCH: 27.9 pg (ref 26.0–34.0)
MCHC: 29.7 g/dL — ABNORMAL LOW (ref 30.0–36.0)
MCV: 94.1 fL (ref 80.0–100.0)
Platelets: 174 K/uL (ref 150–400)
RBC: 5.23 MIL/uL (ref 4.22–5.81)
RDW: 15.5 % (ref 11.5–15.5)
WBC: 7.3 K/uL (ref 4.0–10.5)
nRBC: 0 % (ref 0.0–0.2)

## 2024-05-18 LAB — COMPREHENSIVE METABOLIC PANEL WITH GFR
ALT: 13 U/L (ref 0–44)
AST: 21 U/L (ref 15–41)
Albumin: 4 g/dL (ref 3.5–5.0)
Alkaline Phosphatase: 38 U/L (ref 38–126)
Anion gap: 9 (ref 5–15)
BUN: 24 mg/dL — ABNORMAL HIGH (ref 8–23)
CO2: 25 mmol/L (ref 22–32)
Calcium: 10.1 mg/dL (ref 8.9–10.3)
Chloride: 108 mmol/L (ref 98–111)
Creatinine, Ser: 1.1 mg/dL (ref 0.61–1.24)
GFR, Estimated: >60 mL/min
Glucose, Bld: 80 mg/dL (ref 70–99)
Potassium: 4.3 mmol/L (ref 3.5–5.1)
Sodium: 142 mmol/L (ref 135–145)
Total Bilirubin: 0.3 mg/dL (ref 0.0–1.2)
Total Protein: 7.4 g/dL (ref 6.5–8.1)

## 2024-05-18 LAB — TROPONIN T, HIGH SENSITIVITY
Troponin T High Sensitivity: 23 ng/L — ABNORMAL HIGH (ref 0–19)
Troponin T High Sensitivity: 26 ng/L — ABNORMAL HIGH (ref 0–19)

## 2024-05-18 LAB — RESP PANEL BY RT-PCR (RSV, FLU A&B, COVID)  RVPGX2
Influenza A by PCR: NEGATIVE
Influenza B by PCR: NEGATIVE
Resp Syncytial Virus by PCR: NEGATIVE
SARS Coronavirus 2 by RT PCR: NEGATIVE

## 2024-05-18 NOTE — ED Notes (Signed)
[facility] called for pt 

## 2024-05-18 NOTE — ED Notes (Signed)
 Pt tried to walk outside through the ems bay.

## 2024-05-18 NOTE — ED Provider Notes (Signed)
 " [place] EMERGENCY DEPARTMENT AT [facility] Provider Note  CSN: [id number] Arrival date & time: 05/18/24 1034  Chief Complaint(s) Chest Pain and Cough  HPI Dennis [name] is a 78 y.o. male history of dementia, hypertension, hyperlipidemia presenting with cough.  Apparently patient had cough, was complaining of chest tightness earlier.  Patient does not know why he is here.  He reports that he currently feels pretty good.  He denies any complaints.  History is limited due to his dementia.   Past Medical History Past Medical History:  Diagnosis Date   Arthritis    Dementia (HCC)    Hypertension    Patient Active Problem List   Diagnosis Date Noted   Balance problem 02/12/2024   General weakness 02/02/2024   Palliative care by specialist 01/30/2024   Goals of care, counseling/discussion 01/30/2024   Counseling and coordination of care 01/30/2024   History of CVA (cerebrovascular accident) 01/30/2024   Dementia (HCC) 01/30/2024   Community acquired pneumonia 01/30/2024   Bronchopneumonia 01/28/2024   Sepsis (HCC) 01/17/2024   Pneumonia 01/17/2024   Expressive aphasia 12/29/2023   Acute encephalopathy 12/29/2023   History of essential hypertension 12/29/2023   Anemia of chronic disease 12/29/2023   Dysarthria 12/28/2023   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/25/2023   Dyspnea, unspecified 12/25/2023   Syncope 09/16/2023   AKI (acute kidney injury) 09/16/2023   UTI (urinary tract infection) 09/16/2023   Acute metabolic encephalopathy 09/16/2023   History of stroke 09/16/2023   Agitation 07/10/2023   Dysphagia 07/02/2023   Dementia with behavioral disturbance (HCC) 07/02/2023   Acute CVA (cerebrovascular accident) (HCC) 06/21/2023   BPH (benign prostatic hyperplasia) 06/21/2023   Altered mental state 02/22/2023   Difficulty walking 02/22/2023   COVID 01/02/2023   Left leg weakness 07/26/2022   Wart of hand 05/27/2022   Phimosis 05/24/2022    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 Authorizing Provider  atorvastatin  (LIPITOR ) 40 MG tablet Take 1 tablet (40 mg total) by mouth daily. Patient taking differently: Take 40 mg by mouth at bedtime. 03/16/23   Ghimire, Donalda HERO, MD  carvedilol  (COREG ) 6.25 MG tablet Take 1 tablet (6.25 mg total) by mouth 2 (two) times daily with a meal. 02/02/24   Verdene Purchase, MD  clopidogrel  (PLAVIX ) 75 MG tablet Take 1 tablet (75 mg total) by mouth daily. 12/31/23   Mdala-Gausi, Masiku Agatha, MD  docusate sodium  (COLACE) 100 MG capsule Take 1 capsule (100 mg total) by mouth 2 (two) times daily. 08/05/23   Darci Pore, MD  finasteride  (PROSCAR ) 5 MG tablet Take 1 tablet (5 mg total) by mouth daily. 03/16/23   Ghimire, Donalda HERO, MD  FLUoxetine  (PROZAC ) 20 MG capsule Take 20 mg by mouth daily.    [provider]  folic acid  (FOLVITE ) 1 MG tablet Take 1 tablet (1 mg total) by mouth daily. 03/16/23   Ghimire, Donalda HERO, MD  losartan  (COZAAR ) 25 MG tablet Take 1 tablet (25 mg total) by mouth at bedtime. 02/13/24   Thukkani, Arun K, MD  pantoprazole  (PROTONIX ) 40 MG tablet Take 1 tablet (40 mg total) by mouth  daily. 08/06/23   Darci Pore, MD  polyethylene glycol (MIRALAX  / GLYCOLAX ) 17 g packet Take 17 g by mouth daily. 02/02/24   Krishnan, Gokul, MD  QUEtiapine  (SEROQUEL ) 50 MG tablet Take 1 tablet (50 mg total) by mouth at bedtime. 12/30/23   Mdala-Gausi, Masiku Agatha, MD  senna-docusate (SENOKOT-S) 8.6-50 MG tablet Take 1 tablet by mouth at bedtime as  needed for mild constipation. 08/05/23   Darci Pore, MD  tamsulosin  (FLOMAX ) 0.4 MG CAPS capsule Take 1 capsule (0.4 mg total) by mouth daily. 03/16/23   Ghimire, Donalda HERO, MD                                                                                                                                    Past Surgical History Past Surgical History:  Procedure Laterality Date   CHOLECYSTECTOMY     THROAT SURGERY     Family History No family history on file.  Social History Social History[1] Allergies Codeine and Shellfish allergy  Review of Systems Review of Systems  All other systems reviewed and are negative.   Physical Exam Vital Signs  I have reviewed the triage vital signs BP (!) 168/86   Pulse 65   Temp 97.8 F (36.6 C) (Oral)   Resp 18   SpO2 95%  Physical Exam Vitals and nursing note reviewed.  Constitutional:      General: He is not in acute distress.    Appearance: Normal appearance.  HENT:     Mouth/Throat:     Mouth: Mucous membranes are moist.  Eyes:     Conjunctiva/sclera: Conjunctivae normal.  Cardiovascular:     Rate and Rhythm: Normal rate and regular rhythm.  Pulmonary:     Effort: Pulmonary effort is normal. No respiratory distress.     Breath sounds: Normal breath sounds.  Abdominal:     General: Abdomen is flat.     Palpations: Abdomen is soft.     Tenderness: There is no abdominal tenderness.  Musculoskeletal:     Right lower leg: No edema.     Left lower leg: No edema.  Skin:    General: Skin is warm and dry.     Capillary Refill: Capillary refill takes less than 2 seconds.  Neurological:     Mental Status: He is alert. Mental status is at baseline.     Comments: Oriented to self, follows commands  Psychiatric:        Mood and Affect: Mood normal.        Behavior: Behavior normal.     ED Results and Treatments Labs (all labs ordered are listed, but only abnormal results are displayed) Labs Reviewed  COMPREHENSIVE  METABOLIC PANEL WITH GFR - Abnormal; Notable for the following components:      Result Value   BUN 24 (*)    All other components within normal limits  CBC - Abnormal; Notable for the following components:  MCHC 29.7 (*)    All other components within normal limits  TROPONIN T, HIGH SENSITIVITY - Abnormal; Notable for the following components:   Troponin T High Sensitivity 23 (*)    All other components within normal limits  TROPONIN T, HIGH SENSITIVITY - Abnormal; Notable for the following components:   Troponin T High Sensitivity 26 (*)    All other components within normal limits  RESP PANEL BY RT-PCR (RSV, FLU A&B, COVID)  RVPGX2                                                                                                                          Radiology DG Chest Port 1 View Result Date: 05/18/2024 EXAM: 1 VIEW(S) XRAY OF THE CHEST 05/18/2024 11:07:00 AM COMPARISON: 05/01/2024 CLINICAL HISTORY: shortness of breath, cough FINDINGS: LUNGS AND PLEURA: No focal pulmonary opacity. No pleural effusion. No pneumothorax. HEART AND MEDIASTINUM: The heart is mildly enlarged. Aortic atherosclerosis. BONES AND SOFT TISSUES: No acute osseous abnormality. IMPRESSION: 1. No acute findings. Electronically signed by: Franky Crease MD 05/18/2024 12:57 PM EST RP Workstation: [id number]    Pertinent labs & imaging results that were available during my care of the patient were reviewed by me and considered in my medical decision making (see MDM for details).  Medications Ordered in ED Medications - No data to display                                                                                                                                   Procedures Procedures  (including critical care time)  Medical Decision Making / ED Course   MDM:  78 year old presenting to the emergency department with cough,.  Chest pain earlier.  Currently patient is very well-appearing, vital signs are  reassuring.  No hypoxia, tachypnea.  His examination is reassuring with clear lungs.  He denies any complaints currently and is alert.  Symptoms seem most consistent with viral infection, chest leg x-ray obtained without evidence of underlying pneumonia, pneumothorax.  His lungs are clear without wheezing to suggest COPD or bronchospasm.  He is in no respiratory distress.  Was complaining of some vague chest tightness earlier although he denies any discomfort currently.  His troponin is minimally elevated but stable.  His EKG is without change from prior.  His flu and COVID test is negative.  I do not think there would be any benefit from  inpatient hospitalization and feel he is stable for discharge back to his facility. Will discharge patient to home.        Additional history obtained: -Additional history obtained from ems -External records from outside source obtained and reviewed including: Chart review including previous notes, labs, imaging, consultation notes including prior notes    Lab Tests: -I ordered, reviewed, and interpreted labs.   The pertinent results include:   Labs Reviewed  COMPREHENSIVE METABOLIC PANEL WITH GFR - Abnormal; Notable for the following components:      Result Value   BUN 24 (*)    All other components within normal limits  CBC - Abnormal; Notable for the following components:   MCHC 29.7 (*)    All other components within normal limits  TROPONIN T, HIGH SENSITIVITY - Abnormal; Notable for the following components:   Troponin T High Sensitivity 23 (*)    All other components within normal limits  TROPONIN T, HIGH SENSITIVITY - Abnormal; Notable for the following components:   Troponin T High Sensitivity 26 (*)    All other components within normal limits  RESP PANEL BY RT-PCR (RSV, FLU A&B, COVID)  RVPGX2    Notable for minimal troponin elevation   EKG   EKG Interpretation Date/Time:  Tuesday May 18 2024 10:54:22 EST Ventricular Rate:  67 PR  Interval:  180 QRS Duration:  87 QT Interval:  388 QTC Calculation: 410 R Axis:   22  Text Interpretation: Sinus rhythm Abnormal T, consider ischemia, diffuse leads ST elevation, likely early repolarization, similar changes have been previously seen 01 May 2024 and Sep 2025 Confirmed by Rogelia Satterfield ([id number]) on 05/18/2024 10:56:55 AM         Imaging Studies ordered: I ordered imaging studies including CXR On my interpretation imaging demonstrates no acute process I independently visualized and interpreted imaging. I agree with the radiologist interpretation   Medicines ordered and prescription drug management: No orders of the defined types were placed in this encounter.   -I have reviewed the patients home medicines and have made adjustments as needed   Reevaluation: After the interventions noted above, I reevaluated the patient and found that their symptoms have resolved  Co morbidities that complicate the patient evaluation  Past Medical History:  Diagnosis Date   Arthritis    Dementia (HCC)    Hypertension       Dispostion: Disposition decision including need for hospitalization was considered, and patient discharged from emergency department.    Final Clinical Impression(s) / ED Diagnoses Final diagnoses:  Cough, unspecified type     This chart was dictated using voice recognition software.  Despite best efforts to proofread,  errors can occur which can change the documentation meaning.     [1]  Social History Tobacco Use   Smoking status: Former    Types: Cigarettes   Smokeless tobacco: Never  Vaping Use   Vaping status: Never Used  Substance Use Topics   Alcohol  use: No   Drug use: No     Francesca Elsie CROME, MD 05/18/24 1909  "

## 2024-05-18 NOTE — ED Notes (Signed)
Patient refused covid swab, MD notified

## 2024-05-18 NOTE — ED Triage Notes (Addendum)
 Patient BIBA from nursing facility [facility] with chest tightness, hx of dementia, cough/congestion. Patient has legal guardian who claims they never talked to nursing facility. BP 140/80 HR 74 O2 94% RA.

## 2024-05-18 NOTE — ED Provider Triage Note (Signed)
 Emergency Medicine Provider Triage Evaluation Note  Dennis [name] , is a 78 y.o. male  who was evaluated in triage.  Pt BIBA from facility who called EMS due to chest congestion/tightness, as well as cough. Patient is unable to give good history and is presumably at baseline, patient does have a hx of dementia. Vitals are stable and patient is afebrile.  Review of Systems  Positive: Unable to perform due to presumably baseline mental status Negative:   Physical Exam  BP (!) 152/75   Pulse 64   Temp 97.8 F (36.6 C) (Oral)   Resp 16   SpO2 97%  Gen:   Awake, no distress   Resp:  Normal effort  MSK:   Moves extremities without difficulty Other:    Medical Decision Making  Medically screening exam initiated at 10:59 AM.  Appropriate orders placed.  Dennis [name] was informed that the remainder of the evaluation will be completed by another provider, this initial triage assessment does not replace that evaluation, and the importance of remaining in the ED until their evaluation is complete.  Orders: CBC, CMP, EKG, troponin (as unsure if this is chest pain vs. Tightness vs. Congestion), CXR   Dennis Terrall FALCON, PA-C 05/18/24 1103
